# Patient Record
Sex: Female | Born: 1950 | Race: White | Hispanic: No | Marital: Married | Smoking: Never smoker
Health system: Southern US, Community
[De-identification: ages and names within clinical notes are randomized; demographics above are authoritative.]

## PROBLEM LIST (undated history)

## (undated) DIAGNOSIS — Z7901 Long term (current) use of anticoagulants: Secondary | ICD-10-CM

## (undated) DIAGNOSIS — E119 Type 2 diabetes mellitus without complications: Secondary | ICD-10-CM

## (undated) DIAGNOSIS — I739 Peripheral vascular disease, unspecified: Secondary | ICD-10-CM

## (undated) DIAGNOSIS — R42 Dizziness and giddiness: Secondary | ICD-10-CM

## (undated) DIAGNOSIS — E039 Hypothyroidism, unspecified: Secondary | ICD-10-CM

## (undated) DIAGNOSIS — C801 Malignant (primary) neoplasm, unspecified: Secondary | ICD-10-CM

## (undated) DIAGNOSIS — D696 Thrombocytopenia, unspecified: Secondary | ICD-10-CM

## (undated) DIAGNOSIS — D649 Anemia, unspecified: Secondary | ICD-10-CM

## (undated) DIAGNOSIS — Z7982 Long term (current) use of aspirin: Secondary | ICD-10-CM

## (undated) DIAGNOSIS — Z5189 Encounter for other specified aftercare: Secondary | ICD-10-CM

## (undated) DIAGNOSIS — F419 Anxiety disorder, unspecified: Secondary | ICD-10-CM

## (undated) DIAGNOSIS — M545 Low back pain, unspecified: Secondary | ICD-10-CM

## (undated) DIAGNOSIS — Z8719 Personal history of other diseases of the digestive system: Secondary | ICD-10-CM

## (undated) DIAGNOSIS — E785 Hyperlipidemia, unspecified: Secondary | ICD-10-CM

## (undated) DIAGNOSIS — I5189 Other ill-defined heart diseases: Secondary | ICD-10-CM

## (undated) DIAGNOSIS — H269 Unspecified cataract: Secondary | ICD-10-CM

## (undated) DIAGNOSIS — G629 Polyneuropathy, unspecified: Secondary | ICD-10-CM

## (undated) DIAGNOSIS — G4733 Obstructive sleep apnea (adult) (pediatric): Secondary | ICD-10-CM

## (undated) DIAGNOSIS — I700 Atherosclerosis of aorta: Secondary | ICD-10-CM

## (undated) DIAGNOSIS — M1990 Unspecified osteoarthritis, unspecified site: Secondary | ICD-10-CM

## (undated) DIAGNOSIS — M542 Cervicalgia: Secondary | ICD-10-CM

## (undated) DIAGNOSIS — I2510 Atherosclerotic heart disease of native coronary artery without angina pectoris: Secondary | ICD-10-CM

## (undated) DIAGNOSIS — M419 Scoliosis, unspecified: Secondary | ICD-10-CM

## (undated) DIAGNOSIS — Z9842 Cataract extraction status, left eye: Secondary | ICD-10-CM

## (undated) DIAGNOSIS — J45909 Unspecified asthma, uncomplicated: Secondary | ICD-10-CM

## (undated) DIAGNOSIS — E538 Deficiency of other specified B group vitamins: Secondary | ICD-10-CM

## (undated) DIAGNOSIS — I10 Essential (primary) hypertension: Secondary | ICD-10-CM

## (undated) DIAGNOSIS — M5136 Other intervertebral disc degeneration, lumbar region: Secondary | ICD-10-CM

## (undated) DIAGNOSIS — M51369 Other intervertebral disc degeneration, lumbar region without mention of lumbar back pain or lower extremity pain: Secondary | ICD-10-CM

## (undated) DIAGNOSIS — Z9841 Cataract extraction status, right eye: Secondary | ICD-10-CM

## (undated) DIAGNOSIS — G8929 Other chronic pain: Secondary | ICD-10-CM

## (undated) DIAGNOSIS — K219 Gastro-esophageal reflux disease without esophagitis: Secondary | ICD-10-CM

## (undated) DIAGNOSIS — N189 Chronic kidney disease, unspecified: Secondary | ICD-10-CM

## (undated) DIAGNOSIS — N183 Chronic kidney disease, stage 3 unspecified: Secondary | ICD-10-CM

## (undated) DIAGNOSIS — B009 Herpesviral infection, unspecified: Secondary | ICD-10-CM

## (undated) DIAGNOSIS — Z87442 Personal history of urinary calculi: Secondary | ICD-10-CM

## (undated) HISTORY — PX: CYSTECTOMY: SUR359

## (undated) HISTORY — DX: Deficiency of other specified B group vitamins: E53.8

## (undated) HISTORY — DX: Anemia, unspecified: D64.9

## (undated) HISTORY — PX: SPINE SURGERY: SHX786

## (undated) HISTORY — PX: ABDOMINAL HYSTERECTOMY: SHX81

## (undated) HISTORY — DX: Hyperlipidemia, unspecified: E78.5

## (undated) HISTORY — PX: BACK SURGERY: SHX140

## (undated) HISTORY — DX: Chronic kidney disease, unspecified: N18.9

## (undated) HISTORY — DX: Essential (primary) hypertension: I10

## (undated) HISTORY — DX: Type 2 diabetes mellitus without complications: E11.9

## (undated) HISTORY — PX: TUMOR EXCISION: SHX421

## (undated) HISTORY — DX: Encounter for other specified aftercare: Z51.89

## (undated) HISTORY — PX: LUMBAR FUSION: SHX111

## (undated) HISTORY — DX: Herpesviral infection, unspecified: B00.9

## (undated) HISTORY — DX: Malignant (primary) neoplasm, unspecified: C80.1

## (undated) HISTORY — DX: Unspecified cataract: H26.9

## (undated) HISTORY — PX: EYE SURGERY: SHX253

## (undated) HISTORY — DX: Atherosclerotic heart disease of native coronary artery without angina pectoris: I25.10

## (undated) HISTORY — DX: Unspecified osteoarthritis, unspecified site: M19.90

## (undated) HISTORY — PX: CATARACT EXTRACTION: SUR2

## (undated) SURGERY — ESOPHAGOGASTRODUODENOSCOPY (EGD) WITH PROPOFOL
Anesthesia: General

---

## 1984-05-31 DIAGNOSIS — C499 Malignant neoplasm of connective and soft tissue, unspecified: Secondary | ICD-10-CM

## 1984-05-31 HISTORY — PX: TUMOR EXCISION: SHX421

## 1984-05-31 HISTORY — DX: Malignant neoplasm of connective and soft tissue, unspecified: C49.9

## 1990-05-31 HISTORY — PX: VAGINAL HYSTERECTOMY: SHX2639

## 1999-06-01 HISTORY — PX: LUMBAR FUSION: SHX111

## 2007-06-01 DIAGNOSIS — Z955 Presence of coronary angioplasty implant and graft: Secondary | ICD-10-CM

## 2007-06-01 HISTORY — DX: Presence of coronary angioplasty implant and graft: Z95.5

## 2007-06-01 HISTORY — PX: CARDIAC CATHETERIZATION: SHX172

## 2007-07-02 HISTORY — PX: CARDIAC CATHETERIZATION: SHX172

## 2007-07-02 HISTORY — PX: CORONARY ANGIOPLASTY WITH STENT PLACEMENT: SHX49

## 2008-03-31 HISTORY — PX: CORONARY ANGIOPLASTY WITH STENT PLACEMENT: SHX49

## 2008-03-31 HISTORY — PX: CORONARY STENT PLACEMENT: SHX1402

## 2011-09-29 ENCOUNTER — Ambulatory Visit: Payer: Self-pay | Admitting: Internal Medicine

## 2011-10-18 ENCOUNTER — Emergency Department: Payer: Self-pay | Admitting: Emergency Medicine

## 2011-10-18 LAB — URINALYSIS, COMPLETE
Bilirubin,UR: NEGATIVE
Blood: NEGATIVE
Glucose,UR: 50 mg/dL (ref 0–75)
Nitrite: NEGATIVE
Ph: 5 (ref 4.5–8.0)
Protein: 30
RBC,UR: 3 /HPF (ref 0–5)
Squamous Epithelial: 3

## 2011-10-18 LAB — COMPREHENSIVE METABOLIC PANEL
Albumin: 4.1 g/dL (ref 3.4–5.0)
Chloride: 104 mmol/L (ref 98–107)
Creatinine: 1.6 mg/dL — ABNORMAL HIGH (ref 0.60–1.30)
EGFR (African American): 40 — ABNORMAL LOW
EGFR (Non-African Amer.): 34 — ABNORMAL LOW
Glucose: 226 mg/dL — ABNORMAL HIGH (ref 65–99)
Potassium: 4.5 mmol/L (ref 3.5–5.1)
SGPT (ALT): 26 U/L
Sodium: 138 mmol/L (ref 136–145)
Total Protein: 7.9 g/dL (ref 6.4–8.2)

## 2011-10-18 LAB — CBC WITH DIFFERENTIAL/PLATELET
Basophil #: 0 10*3/uL (ref 0.0–0.1)
Basophil %: 0.4 %
Eosinophil #: 0.1 10*3/uL (ref 0.0–0.7)
HCT: 37.2 % (ref 35.0–47.0)
HGB: 12 g/dL (ref 12.0–16.0)
MCHC: 32.4 g/dL (ref 32.0–36.0)
MCV: 94 fL (ref 80–100)
Monocyte %: 5.7 %
Neutrophil #: 6.1 10*3/uL (ref 1.4–6.5)
Platelet: 237 10*3/uL (ref 150–440)
RBC: 3.96 10*6/uL (ref 3.80–5.20)
RDW: 13.3 % (ref 11.5–14.5)

## 2011-10-18 LAB — CK TOTAL AND CKMB (NOT AT ARMC)
CK, Total: 63 U/L (ref 21–215)
CK-MB: 0.7 ng/mL (ref 0.5–3.6)

## 2011-10-18 LAB — TROPONIN I: Troponin-I: <0.02 ng/mL

## 2011-10-19 ENCOUNTER — Other Ambulatory Visit: Payer: Self-pay

## 2011-10-19 ENCOUNTER — Ambulatory Visit: Payer: Self-pay | Admitting: Internal Medicine

## 2011-10-22 ENCOUNTER — Ambulatory Visit: Payer: Self-pay | Admitting: General Practice

## 2012-10-03 ENCOUNTER — Ambulatory Visit: Payer: Self-pay | Admitting: Internal Medicine

## 2013-01-24 ENCOUNTER — Ambulatory Visit: Payer: Self-pay | Admitting: General Practice

## 2013-03-22 DIAGNOSIS — D649 Anemia, unspecified: Secondary | ICD-10-CM | POA: Insufficient documentation

## 2013-03-22 DIAGNOSIS — I739 Peripheral vascular disease, unspecified: Secondary | ICD-10-CM | POA: Insufficient documentation

## 2013-03-27 DIAGNOSIS — M5417 Radiculopathy, lumbosacral region: Secondary | ICD-10-CM

## 2013-03-27 DIAGNOSIS — Z981 Arthrodesis status: Secondary | ICD-10-CM | POA: Insufficient documentation

## 2013-03-27 DIAGNOSIS — M5414 Radiculopathy, thoracic region: Secondary | ICD-10-CM | POA: Insufficient documentation

## 2013-03-27 HISTORY — PX: POSTERIOR LUMBAR FUSION: SHX6036

## 2013-10-03 ENCOUNTER — Ambulatory Visit: Payer: Self-pay | Admitting: General Practice

## 2013-11-14 ENCOUNTER — Ambulatory Visit: Payer: Self-pay | Admitting: Ophthalmology

## 2013-11-14 HISTORY — PX: CATARACT EXTRACTION W/ INTRAOCULAR LENS IMPLANT: SHX1309

## 2013-11-22 ENCOUNTER — Ambulatory Visit: Payer: Self-pay | Admitting: Internal Medicine

## 2014-04-05 ENCOUNTER — Ambulatory Visit: Payer: Self-pay | Admitting: Internal Medicine

## 2014-04-23 DIAGNOSIS — E669 Obesity, unspecified: Secondary | ICD-10-CM | POA: Insufficient documentation

## 2014-05-09 DIAGNOSIS — Z9889 Other specified postprocedural states: Secondary | ICD-10-CM | POA: Insufficient documentation

## 2014-05-09 HISTORY — PX: POSTERIOR LUMBAR FUSION: SHX6036

## 2014-05-14 ENCOUNTER — Emergency Department: Payer: Self-pay | Admitting: Emergency Medicine

## 2014-05-14 LAB — COMPREHENSIVE METABOLIC PANEL
Albumin: 2.6 g/dL — ABNORMAL LOW (ref 3.4–5.0)
Alkaline Phosphatase: 82 U/L
Anion Gap: 9 (ref 7–16)
BUN: 20 mg/dL — AB (ref 7–18)
Bilirubin,Total: 0.8 mg/dL (ref 0.2–1.0)
CALCIUM: 8.4 mg/dL — AB (ref 8.5–10.1)
CHLORIDE: 109 mmol/L — AB (ref 98–107)
CO2: 23 mmol/L (ref 21–32)
Creatinine: 1.51 mg/dL — ABNORMAL HIGH (ref 0.60–1.30)
EGFR (African American): 45 — ABNORMAL LOW
GFR CALC NON AF AMER: 37 — AB
Glucose: 154 mg/dL — ABNORMAL HIGH (ref 65–99)
OSMOLALITY: 287 (ref 275–301)
Potassium: 4.3 mmol/L (ref 3.5–5.1)
SGOT(AST): 23 U/L (ref 15–37)
SGPT (ALT): 19 U/L
Sodium: 141 mmol/L (ref 136–145)
Total Protein: 6.4 g/dL (ref 6.4–8.2)

## 2014-05-14 LAB — CBC WITH DIFFERENTIAL/PLATELET
Basophil #: 0 10*3/uL (ref 0.0–0.1)
Basophil %: 0.4 %
Eosinophil #: 0.1 10*3/uL (ref 0.0–0.7)
Eosinophil %: 1.1 %
HCT: 30.4 % — AB (ref 35.0–47.0)
HGB: 10.3 g/dL — AB (ref 12.0–16.0)
LYMPHS ABS: 0.7 10*3/uL — AB (ref 1.0–3.6)
Lymphocyte %: 10.4 %
MCH: 31 pg (ref 26.0–34.0)
MCHC: 33.8 g/dL (ref 32.0–36.0)
MCV: 92 fL (ref 80–100)
MONO ABS: 0.5 x10 3/mm (ref 0.2–0.9)
Monocyte %: 6.8 %
Neutrophil #: 5.9 10*3/uL (ref 1.4–6.5)
Neutrophil %: 81.3 %
Platelet: 170 10*3/uL (ref 150–440)
RBC: 3.32 10*6/uL — ABNORMAL LOW (ref 3.80–5.20)
RDW: 13.4 % (ref 11.5–14.5)
WBC: 7.2 10*3/uL (ref 3.6–11.0)

## 2014-05-14 LAB — LIPASE, BLOOD: LIPASE: 89 U/L (ref 73–393)

## 2014-06-18 ENCOUNTER — Ambulatory Visit: Payer: Self-pay | Admitting: Surgery

## 2014-11-18 ENCOUNTER — Encounter (INDEPENDENT_AMBULATORY_CARE_PROVIDER_SITE_OTHER): Payer: Self-pay

## 2014-11-18 ENCOUNTER — Ambulatory Visit: Payer: BLUE CROSS/BLUE SHIELD | Attending: Pain Medicine | Admitting: Pain Medicine

## 2014-11-18 ENCOUNTER — Encounter: Payer: Self-pay | Admitting: Pain Medicine

## 2014-11-18 VITALS — BP 134/52 | HR 89 | Temp 97.6°F | Resp 14 | Ht 62.5 in | Wt 184.0 lb

## 2014-11-18 DIAGNOSIS — Z9889 Other specified postprocedural states: Secondary | ICD-10-CM | POA: Diagnosis not present

## 2014-11-18 DIAGNOSIS — G5702 Lesion of sciatic nerve, left lower limb: Secondary | ICD-10-CM | POA: Insufficient documentation

## 2014-11-18 DIAGNOSIS — M533 Sacrococcygeal disorders, not elsewhere classified: Secondary | ICD-10-CM

## 2014-11-18 DIAGNOSIS — M47816 Spondylosis without myelopathy or radiculopathy, lumbar region: Secondary | ICD-10-CM | POA: Insufficient documentation

## 2014-11-18 DIAGNOSIS — G5710 Meralgia paresthetica, unspecified lower limb: Secondary | ICD-10-CM | POA: Insufficient documentation

## 2014-11-18 DIAGNOSIS — M4316 Spondylolisthesis, lumbar region: Secondary | ICD-10-CM | POA: Insufficient documentation

## 2014-11-18 DIAGNOSIS — G5700 Lesion of sciatic nerve, unspecified lower limb: Secondary | ICD-10-CM

## 2014-11-18 DIAGNOSIS — Z981 Arthrodesis status: Secondary | ICD-10-CM | POA: Insufficient documentation

## 2014-11-18 DIAGNOSIS — M961 Postlaminectomy syndrome, not elsewhere classified: Secondary | ICD-10-CM | POA: Insufficient documentation

## 2014-11-18 DIAGNOSIS — M545 Low back pain: Secondary | ICD-10-CM | POA: Diagnosis present

## 2014-11-18 DIAGNOSIS — M5136 Other intervertebral disc degeneration, lumbar region: Secondary | ICD-10-CM | POA: Insufficient documentation

## 2014-11-18 DIAGNOSIS — M2578 Osteophyte, vertebrae: Secondary | ICD-10-CM | POA: Insufficient documentation

## 2014-11-18 NOTE — Patient Instructions (Addendum)
Continue present medications.  Cluneal and sciatic nerve block a 11/27/2014  F/U PCP for evaliation of  BP and general medical  condition.  F/U surgical evaluation.  F/U neurological evaluation.  May consider radiofrequency rhizolysis or intraspinal procedures pending response to present treatment and F/U evaluation.  Patient to call Pain Management Center should patient have concerns prior to scheduled return appointment.    GENERAL RISKS AND COMPLICATIONS  What are the risk, side effects and possible complications? Generally speaking, most procedures are safe.  However, with any procedure there are risks, side effects, and the possibility of complications.  The risks and complications are dependent upon the sites that are lesioned, or the type of nerve block to be performed.  The closer the procedure is to the spine, the more serious the risks are.  Great care is taken when placing the radio frequency needles, block needles or lesioning probes, but sometimes complications can occur. 1. Infection: Any time there is an injection through the skin, there is a risk of infection.  This is why sterile conditions are used for these blocks.  There are four possible types of infection. 1. Localized skin infection. 2. Central Nervous System Infection-This can be in the form of Meningitis, which can be deadly. 3. Epidural Infections-This can be in the form of an epidural abscess, which can cause pressure inside of the spine, causing compression of the spinal cord with subsequent paralysis. This would require an emergency surgery to decompress, and there are no guarantees that the patient would recover from the paralysis. 4. Discitis-This is an infection of the intervertebral discs.  It occurs in about 1% of discography procedures.  It is difficult to treat and it may lead to surgery.        2. Pain: the needles have to go through skin and soft tissues, will cause soreness.       3. Damage to internal  structures:  The nerves to be lesioned may be near blood vessels or    other nerves which can be potentially damaged.       4. Bleeding: Bleeding is more common if the patient is taking blood thinners such as  aspirin, Coumadin, Ticiid, Plavix, etc., or if he/she have some genetic predisposition  such as hemophilia. Bleeding into the spinal canal can cause compression of the spinal  cord with subsequent paralysis.  This would require an emergency surgery to  decompress and there are no guarantees that the patient would recover from the  paralysis.       5. Pneumothorax:  Puncturing of a lung is a possibility, every time a needle is introduced in  the area of the chest or upper back.  Pneumothorax refers to free air around the  collapsed lung(s), inside of the thoracic cavity (chest cavity).  Another two possible  complications related to a similar event would include: Hemothorax and Chylothorax.   These are variations of the Pneumothorax, where instead of air around the collapsed  lung(s), you may have blood or chyle, respectively.       6. Spinal headaches: They may occur with any procedures in the area of the spine.       7. Persistent CSF (Cerebro-Spinal Fluid) leakage: This is a rare problem, but may occur  with prolonged intrathecal or epidural catheters either due to the formation of a fistulous  track or a dural tear.       8. Nerve damage: By working so close to the spinal cord, there  is always a possibility of  nerve damage, which could be as serious as a permanent spinal cord injury with  paralysis.       9. Death:  Although rare, severe deadly allergic reactions known as "Anaphylactic  reaction" can occur to any of the medications used.      10. Worsening of the symptoms:  We can always make thing worse.  What are the chances of something like this happening? Chances of any of this occuring are extremely low.  By statistics, you have more of a chance of getting killed in a motor vehicle  accident: while driving to the hospital than any of the above occurring .  Nevertheless, you should be aware that they are possibilities.  In general, it is similar to taking a shower.  Everybody knows that you can slip, hit your head and get killed.  Does that mean that you should not shower again?  Nevertheless always keep in mind that statistics do not mean anything if you happen to be on the wrong side of them.  Even if a procedure has a 1 (one) in a 1,000,000 (million) chance of going wrong, it you happen to be that one..Also, keep in mind that by statistics, you have more of a chance of having something go wrong when taking medications.  Who should not have this procedure? If you are on a blood thinning medication (e.g. Coumadin, Plavix, see list of "Blood Thinners"), or if you have an active infection going on, you should not have the procedure.  If you are taking any blood thinners, please inform your physician.  How should I prepare for this procedure?  Do not eat or drink anything at least six hours prior to the procedure.  Bring a driver with you .  It cannot be a taxi.  Come accompanied by an adult that can drive you back, and that is strong enough to help you if your legs get weak or numb from the local anesthetic.  Take all of your medicines the morning of the procedure with just enough water to swallow them.  If you have diabetes, make sure that you are scheduled to have your procedure done first thing in the morning, whenever possible.  If you have diabetes, take only half of your insulin dose and notify our nurse that you have done so as soon as you arrive at the clinic.  If you are diabetic, but only take blood sugar pills (oral hypoglycemic), then do not take them on the morning of your procedure.  You may take them after you have had the procedure.  Do not take aspirin or any aspirin-containing medications, at least eleven (11) days prior to the procedure.  They may prolong  bleeding.  Wear loose fitting clothing that may be easy to take off and that you would not mind if it got stained with Betadine or blood.  Do not wear any jewelry or perfume  Remove any nail coloring.  It will interfere with some of our monitoring equipment.  NOTE: Remember that this is not meant to be interpreted as a complete list of all possible complications.  Unforeseen problems may occur.  BLOOD THINNERS The following drugs contain aspirin or other products, which can cause increased bleeding during surgery and should not be taken for 2 weeks prior to and 1 week after surgery.  If you should need take something for relief of minor pain, you may take acetaminophen which is found in Tylenol,m Datril, Anacin-3  and Panadol. It is not blood thinner. The products listed below are.  Do not take any of the products listed below in addition to any listed on your instruction sheet.  A.P.C or A.P.C with Codeine Codeine Phosphate Capsules #3 Ibuprofen Ridaura  ABC compound Congesprin Imuran rimadil  Advil Cope Indocin Robaxisal  Alka-Seltzer Effervescent Pain Reliever and Antacid Coricidin or Coricidin-D  Indomethacin Rufen  Alka-Seltzer plus Cold Medicine Cosprin Ketoprofen S-A-C Tablets  Anacin Analgesic Tablets or Capsules Coumadin Korlgesic Salflex  Anacin Extra Strength Analgesic tablets or capsules CP-2 Tablets Lanoril Salicylate  Anaprox Cuprimine Capsules Levenox Salocol  Anexsia-D Dalteparin Magan Salsalate  Anodynos Darvon compound Magnesium Salicylate Sine-off  Ansaid Dasin Capsules Magsal Sodium Salicylate  Anturane Depen Capsules Marnal Soma  APF Arthritis pain formula Dewitt's Pills Measurin Stanback  Argesic Dia-Gesic Meclofenamic Sulfinpyrazone  Arthritis Bayer Timed Release Aspirin Diclofenac Meclomen Sulindac  Arthritis pain formula Anacin Dicumarol Medipren Supac  Analgesic (Safety coated) Arthralgen Diffunasal Mefanamic Suprofen  Arthritis Strength Bufferin Dihydrocodeine  Mepro Compound Suprol  Arthropan liquid Dopirydamole Methcarbomol with Aspirin Synalgos  ASA tablets/Enseals Disalcid Micrainin Tagament  Ascriptin Doan's Midol Talwin  Ascriptin A/D Dolene Mobidin Tanderil  Ascriptin Extra Strength Dolobid Moblgesic Ticlid  Ascriptin with Codeine Doloprin or Doloprin with Codeine Momentum Tolectin  Asperbuf Duoprin Mono-gesic Trendar  Aspergum Duradyne Motrin or Motrin IB Triminicin  Aspirin plain, buffered or enteric coated Durasal Myochrisine Trigesic  Aspirin Suppositories Easprin Nalfon Trillsate  Aspirin with Codeine Ecotrin Regular or Extra Strength Naprosyn Uracel  Atromid-S Efficin Naproxen Ursinus  Auranofin Capsules Elmiron Neocylate Vanquish  Axotal Emagrin Norgesic Verin  Azathioprine Empirin or Empirin with Codeine Normiflo Vitamin E  Azolid Emprazil Nuprin Voltaren  Bayer Aspirin plain, buffered or children's or timed BC Tablets or powders Encaprin Orgaran Warfarin Sodium  Buff-a-Comp Enoxaparin Orudis Zorpin  Buff-a-Comp with Codeine Equegesic Os-Cal-Gesic   Buffaprin Excedrin plain, buffered or Extra Strength Oxalid   Bufferin Arthritis Strength Feldene Oxphenbutazone   Bufferin plain or Extra Strength Feldene Capsules Oxycodone with Aspirin   Bufferin with Codeine Fenoprofen Fenoprofen Pabalate or Pabalate-SF   Buffets II Flogesic Panagesic   Buffinol plain or Extra Strength Florinal or Florinal with Codeine Panwarfarin   Buf-Tabs Flurbiprofen Penicillamine   Butalbital Compound Four-way cold tablets Penicillin   Butazolidin Fragmin Pepto-Bismol   Carbenicillin Geminisyn Percodan   Carna Arthritis Reliever Geopen Persantine   Carprofen Gold's salt Persistin   Chloramphenicol Goody's Phenylbutazone   Chloromycetin Haltrain Piroxlcam   Clmetidine heparin Plaquenil   Cllnoril Hyco-pap Ponstel   Clofibrate Hydroxy chloroquine Propoxyphen         Before stopping any of these medications, be sure to consult the physician who ordered  them.  Some, such as Coumadin (Warfarin) are ordered to prevent or treat serious conditions such as "deep thrombosis", "pumonary embolisms", and other heart problems.  The amount of time that you may need off of the medication may also vary with the medication and the reason for which you were taking it.  If you are taking any of these medications, please make sure you notify your pain physician before you undergo any procedures.

## 2014-11-18 NOTE — Progress Notes (Signed)
Safety precautions to be maintained throughout the outpatient stay will include: orient to surroundings, keep bed in low position, maintain call bell within reach at all times, provide assistance with transfer out of bed and ambulation. Discharge ambulatory at 2:50 pm

## 2014-11-18 NOTE — Progress Notes (Signed)
Subjective:    Patient ID: Dorothy Rogers, female    DOB: Mar 14, 1951, 64 y.o.   MRN: [id number]  HPI   the patient is a 64 year old female who comes to pain management Center at the request of [facility] Reek for further evaluation and treatment of pain involving the lower back lower extremity region. The patient is status post surgical intervention of the lumbar region 3. Patient is undergone evaluation by Dr. Napoleon Form was recommended patient comes to pain management Center for further treatment of her condition. We discussed patient's condition on today's visit. Patient described her pain as aching burning deep sensation with feeling of constriction and pressure-like sensation involving the buttocks on the left predominantly. The pain occasionally awakens patient from sleep and is aggravated by walking working any motion sitting standing and lifting climbing bending. Patient states that the pain has decreased with hot packs resting sitting sleeping use of brace physical therapy. We discussed patient's overall condition and will consider patient for interventional treatment at time return appointment. We will proceed with [place] and sciatic nerve block at time of return appointment. We will also discussed patient being candidate for ilioinguinal , iliohypogastric, genitofemoral nerve blocks as  well as interventional treatment of the spine. We will observe response to cluneal and sciatic nerve block, consider patient for additional modification of treatment pending response to cluneal sciatic nerve block. The patient was understanding and agreed with suggested treatment plan.      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 Palpation of the cervical facet thoracic facet regions were with tenderness to palpation of mild degree. Palpation of the acromioclavicular and glenohumeral joint regions were with mild discomfort. Tinel and Phalen's maneuver were without increase of pain of significant degree. There was no crepitus of the thoracic region noted. Palpation of the lower thoracic paraspinal musculature region was with evidence of mild to moderate muscle spasm. Palpation over the lumbar paraspinal musculature region lumbar facet region was a tends to palpation of moderate degree left greater than the right. Lateral bending and rotation and extension and palpation of the lumbar facets reproduce moderate to moderately severe discomfort period there was significant increase of pain with palpation of the PSIS PSIS region. Palpation of the gluteal and piriformis musculature regions reproduced moderate to moderately severe pain. Straight leg  Raising was tolerates approximately 20 without increase of pain with dorsiflexion noted. There was negative clonus negative Homans. Palpation of the greater trochanteric region iliotibial band region was a tends to palpation of mild degree. Negative clonus negative Homans. Abdomen nontender and no costovertebral angle tenderness noted.       Assessment & Plan:   Degenerative disc disease lumbar spine  Post surgical intervention lumbar region 3  Status post posterior fusion L3 L4 L5 and S1  Grade 1 retrolisthesis of L1 on L2, L2-L3 , and L3 on L4 without significant change from prior study  Rod and screw devices transfixing L3, L4,  and L5 vertebral with by pedicular approach disc cage seen at the L5-S1 level.  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  Cluneal and sciatic nerve blocks to be performed at time return appointment  F/U PCP for evaliation of  BP and general medical  condition.  F/U surgical evaluation.  F/U neurological evaluation.  Will consider TENS unit as discussed  pending further assessment of patient's condition  May consider radiofrequency rhizolysis or intraspinal procedures pending response to present treatment and F/U evaluation.  Patient to call Pain Management Center should patient have concerns prior to scheduled return appointment.

## 2014-11-20 ENCOUNTER — Telehealth: Payer: Self-pay | Admitting: Pain Medicine

## 2014-11-20 NOTE — Telephone Encounter (Signed)
Has some questions about upcoming procedure / please call to discuss

## 2014-11-20 NOTE — Telephone Encounter (Signed)
Pateints questions and concerns answered. Pre procedure instructions given.

## 2014-11-25 ENCOUNTER — Telehealth: Payer: Self-pay | Admitting: *Deleted

## 2014-11-25 NOTE — Telephone Encounter (Signed)
Called patient  Left msg that procedure  Was approved

## 2014-11-26 ENCOUNTER — Other Ambulatory Visit: Payer: Self-pay | Admitting: Pain Medicine

## 2014-11-27 ENCOUNTER — Ambulatory Visit: Payer: BLUE CROSS/BLUE SHIELD | Attending: Pain Medicine | Admitting: Pain Medicine

## 2014-11-27 ENCOUNTER — Encounter: Payer: Self-pay | Admitting: Pain Medicine

## 2014-11-27 VITALS — BP 106/86 | HR 85 | Temp 96.8°F | Resp 18 | Ht 62.5 in | Wt 184.0 lb

## 2014-11-27 DIAGNOSIS — M79604 Pain in right leg: Secondary | ICD-10-CM | POA: Diagnosis present

## 2014-11-27 DIAGNOSIS — M79605 Pain in left leg: Secondary | ICD-10-CM | POA: Diagnosis present

## 2014-11-27 DIAGNOSIS — G5702 Lesion of sciatic nerve, left lower limb: Secondary | ICD-10-CM

## 2014-11-27 DIAGNOSIS — M2578 Osteophyte, vertebrae: Secondary | ICD-10-CM | POA: Diagnosis not present

## 2014-11-27 DIAGNOSIS — M5136 Other intervertebral disc degeneration, lumbar region: Secondary | ICD-10-CM | POA: Diagnosis not present

## 2014-11-27 DIAGNOSIS — M47816 Spondylosis without myelopathy or radiculopathy, lumbar region: Secondary | ICD-10-CM | POA: Diagnosis not present

## 2014-11-27 DIAGNOSIS — Z981 Arthrodesis status: Secondary | ICD-10-CM | POA: Insufficient documentation

## 2014-11-27 DIAGNOSIS — M4316 Spondylolisthesis, lumbar region: Secondary | ICD-10-CM | POA: Diagnosis not present

## 2014-11-27 DIAGNOSIS — M545 Low back pain: Secondary | ICD-10-CM | POA: Diagnosis present

## 2014-11-27 DIAGNOSIS — M961 Postlaminectomy syndrome, not elsewhere classified: Secondary | ICD-10-CM

## 2014-11-27 MED ORDER — TRIAMCINOLONE ACETONIDE 40 MG/ML IJ SUSP
INTRAMUSCULAR | Status: AC
  Filled 2014-11-27: qty 1

## 2014-11-27 MED ORDER — BUPIVACAINE HCL (PF) 0.25 % IJ SOLN
INTRAMUSCULAR | Status: AC
  Filled 2014-11-27: qty 30

## 2014-11-27 MED ORDER — ORPHENADRINE CITRATE 30 MG/ML IJ SOLN
INTRAMUSCULAR | Status: AC
  Filled 2014-11-27: qty 2

## 2014-11-27 NOTE — Progress Notes (Signed)
Safety precautions to be maintained throughout the outpatient stay will include: orient to surroundings, keep bed in low position, maintain call bell within reach at all times, provide assistance with transfer out of bed and ambulation.  

## 2014-11-27 NOTE — Progress Notes (Signed)
Subjective:    Patient ID: Dorothy Rogers, female    DOB: 01/30/1951, 64 y.o.   MRN: [id number]  HPI  PROCEDURE PERFORMED: Cluneal sciatic nerve block   NOTE: The patient is a 64 y.o. female who returns to [place] [facility] for further evaluation and treatment of pain involving the lumbar and lower extremity region with pain occurring in the region of the buttocks and lower extremity of significant degree. Prior studies revealed the patient to be with MRI findings of degenerative disc disease lumbar spine with post surgical intervention of the lumbar region 3 with prior posterior fusion L3-L4 L4-5 and S1. Grade 1 retrolisthesis of L1 on L2, L2-L3, and L3 and L4 without significant change from prior study. Rod and screw devices transfixing L3, L4, and L5 vertebral body levels with radicular approach disc cage seen at L5-S1 level. Prominent marginal osteophytes with moderate spondylosis throughout the lumbar spine. There is concern regarding intraspinal abnormalities contributing to patient's symptomatology as well as concern regarding significant component of patient's pain being due to and piriformis neuralgia. The risks, benefits, and expectations of the procedure have been discussed and explained to the patient who was understanding and in agreement with suggested treatment plan. We will proceed with interventional treatment as discussed and as explained to the patient who wishes to proceed with proposed treatment.   PROCEDURE #1: Left cluneal nerve block with EKG, blood pressure, pulse, and pulse oximetry monitoring. The procedure was performed with the patient in the lateral decubitus position. Following alcohol prep of proposed entry site and identification of landmarks, a 22 -gauge needle was inserted into the gluteal musculature region and following elicitation of paresthesias radiating to the buttocks, needle was slightly withdrawn and following negative aspiration, a total of 4 mL of  0.25% bupivacaine with Kenalog injected for left  cluneal nerve block. Needle removed. The patient tolerated the injection well.   PROCEDURE #2: Left sciatic nerve block with EKG, blood pressure, pulse, and pulse oximetry monitoring. The procedure was performed with the patient in the lateral decubitus position. Following identification of greater trochanter for establishing point of needle entry and alcohol prep of proposed entry site, a 22 -gauge needle was inserted and following elicitation of paresthesias radiating from buttocks to the foot, needle was slightly withdrawn. Following negative aspiration, a total of 4 mL of 0.25% bupivacaine with Kenalog injected for left sciatic nerve block. Needle removed. The patient tolerated injection well. A total of 10 mg of Kenalog was utilized for the procedure.   PLAN:   1. Medications: Will continue presently prescribed medications. 2. Will consider modification of treatment regimen pending response to treatment rendered on today's visit and follow-up evaluation. 3. The patient is to follow-up with primary care physician, Dr. Doy Hutching, regarding blood pressure, diabetes mellitus, and general medical condition status pose procedure performed on today's visit. 4. Surgical evaluation as discussed. 5. Neurological evaluation as discussed. 6. The patient may be a candidate for radiofrequency procedures, Botox injections, implantation type procedures, and other treatment pending response to treatment rendered on today's visit and follow-up evaluation. 7. The patient has been advised to adhere to proper body mechanics and avoid activities which appear to aggravate condition. 8. The patient has been advised to call the Pain Management Center prior to scheduled return appointment should there be significant change in condition or should patient have other concerns regarding condition prior to scheduled return appointment.  The patient is understanding and in  agreement with suggested treatment plan.  Review of Systems     Objective:   Physical Exam        Assessment & Plan:

## 2014-11-27 NOTE — Patient Instructions (Addendum)
Continue present medications  F/U PCP for evaliation of  BP and general medical  condition.  F/U surgical evaluation  F/U neurological evaluation  May consider radiofrequency rhizolysis or intraspinal procedures pending response to present treatment and F/U evaluation.  Patient to call Pain Management Center should patient have concerns prior to scheduled return appointment.   Pain Management Discharge Instructions  General Discharge Instructions :  If you need to reach your doctor call: Monday-Friday 8:00 am - 4:00 pm at [phone] or toll free [phone].  After clinic hours [phone] to have operator reach doctor.  Bring all of your medication bottles to all your appointments in the pain clinic.  To cancel or reschedule your appointment with Pain Management please remember to call 24 hours in advance to avoid a fee.  Refer to the educational materials which you have been given on: General Risks, I had my Procedure. Discharge Instructions, Post Sedation.  Post Procedure Instructions:   You may eat anything you prefer, but it is better to start with liquids then soups and crackers, and gradually work up to solid foods.  Please notify your doctor immediately if you have any unusual bleeding, trouble breathing or pain that is not related to your normal pain.  Depending on the type of procedure that was done, some parts of your body may feel week and/or numb.  This usually clears up by tonight or the next day.  Walk with the use of an assistive device or accompanied by an adult for the 24 hours.  You may use ice on the affected area for the first 24 hours.  Put ice in a Ziploc bag and cover with a towel and place against area 15 minutes on 15 minutes off.  You may switch to heat after 24 hours.

## 2014-11-28 ENCOUNTER — Telehealth: Payer: Self-pay | Admitting: *Deleted

## 2014-11-28 NOTE — Telephone Encounter (Signed)
Patient denies any complications post procedure 

## 2014-12-06 ENCOUNTER — Other Ambulatory Visit: Payer: Self-pay | Admitting: Pain Medicine

## 2014-12-19 ENCOUNTER — Other Ambulatory Visit: Payer: Self-pay | Admitting: Internal Medicine

## 2014-12-19 DIAGNOSIS — Z1231 Encounter for screening mammogram for malignant neoplasm of breast: Secondary | ICD-10-CM

## 2014-12-24 ENCOUNTER — Ambulatory Visit
Admission: RE | Admit: 2014-12-24 | Discharge: 2014-12-24 | Disposition: A | Discharge status: Home / Self Care | Payer: BLUE CROSS/BLUE SHIELD | Source: Ambulatory Visit | Attending: Internal Medicine | Admitting: Internal Medicine

## 2014-12-24 DIAGNOSIS — Z1231 Encounter for screening mammogram for malignant neoplasm of breast: Secondary | ICD-10-CM

## 2014-12-25 ENCOUNTER — Encounter: Payer: Self-pay | Admitting: Pain Medicine

## 2014-12-25 ENCOUNTER — Other Ambulatory Visit: Payer: Self-pay | Admitting: Pain Medicine

## 2014-12-25 ENCOUNTER — Ambulatory Visit: Payer: BLUE CROSS/BLUE SHIELD | Attending: Pain Medicine | Admitting: Pain Medicine

## 2014-12-25 VITALS — BP 138/75 | HR 91 | Temp 98.0°F | Resp 14 | Ht 62.5 in | Wt 186.0 lb

## 2014-12-25 DIAGNOSIS — M47816 Spondylosis without myelopathy or radiculopathy, lumbar region: Secondary | ICD-10-CM | POA: Insufficient documentation

## 2014-12-25 DIAGNOSIS — M545 Low back pain: Secondary | ICD-10-CM | POA: Diagnosis present

## 2014-12-25 DIAGNOSIS — Z9889 Other specified postprocedural states: Secondary | ICD-10-CM | POA: Diagnosis not present

## 2014-12-25 DIAGNOSIS — I10 Essential (primary) hypertension: Secondary | ICD-10-CM | POA: Insufficient documentation

## 2014-12-25 DIAGNOSIS — M961 Postlaminectomy syndrome, not elsewhere classified: Secondary | ICD-10-CM

## 2014-12-25 DIAGNOSIS — G5702 Lesion of sciatic nerve, left lower limb: Secondary | ICD-10-CM

## 2014-12-25 DIAGNOSIS — M5136 Other intervertebral disc degeneration, lumbar region: Secondary | ICD-10-CM

## 2014-12-25 DIAGNOSIS — G588 Other specified mononeuropathies: Secondary | ICD-10-CM | POA: Insufficient documentation

## 2014-12-25 DIAGNOSIS — M79604 Pain in right leg: Secondary | ICD-10-CM | POA: Diagnosis present

## 2014-12-25 DIAGNOSIS — M4316 Spondylolisthesis, lumbar region: Secondary | ICD-10-CM | POA: Insufficient documentation

## 2014-12-25 DIAGNOSIS — G5710 Meralgia paresthetica, unspecified lower limb: Secondary | ICD-10-CM | POA: Diagnosis not present

## 2014-12-25 DIAGNOSIS — E119 Type 2 diabetes mellitus without complications: Secondary | ICD-10-CM | POA: Diagnosis not present

## 2014-12-25 DIAGNOSIS — Z981 Arthrodesis status: Secondary | ICD-10-CM

## 2014-12-25 DIAGNOSIS — M533 Sacrococcygeal disorders, not elsewhere classified: Secondary | ICD-10-CM | POA: Insufficient documentation

## 2014-12-25 DIAGNOSIS — M79605 Pain in left leg: Secondary | ICD-10-CM | POA: Diagnosis present

## 2014-12-25 NOTE — Progress Notes (Signed)
Safety precautions to be maintained throughout the outpatient stay will include: orient to surroundings, keep bed in low position, maintain call bell within reach at all times, provide assistance with transfer out of bed and ambulation.  

## 2014-12-25 NOTE — Patient Instructions (Addendum)
Continue present medications  F/U PCP Dr. Doy Hutching  for evaliation of  BP and general medical  condition.  F/U surgical evaluation  F/U neurological evaluation  May consider radiofrequency rhizolysis or intraspinal procedures pending response to present treatment and F/U evaluation.  Patient to call [facility] should patient have concerns prior to scheduled return appointment. Facet Blocks Patient Information  Description: The facets are joints in the spine between the vertebrae.  Like any joints in the body, facets can become irritated and painful.  Arthritis can also effect the facets.  By injecting steroids and local anesthetic in and around these joints, we can temporarily block the nerve supply to them.  Steroids act directly on irritated nerves and tissues to reduce selling and inflammation which often leads to decreased pain.  Facet blocks may be done anywhere along the spine from the neck to the low back depending upon the location of your pain.   After numbing the skin with local anesthetic (like Novocaine), a small needle is passed onto the facet joints under x-ray guidance.  You may experience a sensation of pressure while this is being done.  The entire block usually lasts about 15-25 minutes.   Conditions which may be treated by facet blocks:   Low back/buttock pain  Neck/shoulder pain  Certain types of headaches  Preparation for the injection:  1. Do not eat any solid food or dairy products within 6 hours of your appointment. 2. You may drink clear liquid up to 2 hours before appointment.  Clear liquids include water, black coffee, juice or soda.  No milk or cream please. 3. You may take your regular medication, including pain medications, with a sip of water before your appointment.  Diabetics should hold regular insulin (if taken separately) and take 1/2 normal NPH dose the morning of the procedure.  Carry some sugar containing items with you to your  appointment. 4. A driver must accompany you and be prepared to drive you home after your procedure. 5. Bring all your current medications with you. 6. An IV may be inserted and sedation may be given at the discretion of the physician. 7. A blood pressure cuff, EKG and other monitors will often be applied during the procedure.  Some patients may need to have extra oxygen administered for a short period. 8. You will be asked to provide medical information, including your allergies and medications, prior to the procedure.  We must know immediately if you are taking blood thinners (like Coumadin/Warfarin) or if you are allergic to IV iodine contrast (dye).  We must know if you could possible be pregnant.  Possible side-effects:   Bleeding from needle site  Infection (rare, may require surgery)  Nerve injury (rare)  Numbness & tingling (temporary)  Difficulty urinating (rare, temporary)  Spinal headache (a headache worse with upright posture)  Light-headedness (temporary)  Pain at injection site (serveral days)  Decreased blood pressure (rare, temporary)  Weakness in arm/leg (temporary)  Pressure sensation in back/neck (temporary)   Call if you experience:   Fever/chills associated with headache or increased back/neck pain  Headache worsened by an upright position  New onset, weakness or numbness of an extremity below the injection site  Hives or difficulty breathing (go to the emergency room)  Inflammation or drainage at the injection site(s)  Severe back/neck pain greater than usual  New symptoms which are concerning to you  Please note:  Although the local anesthetic injected can often make your back or neck  feel good for several hours after the injection, the pain will likely return. It takes 3-7 days for steroids to work.  You may not notice any pain relief for at least one week.  If effective, we will often do a series of 2-3 injections spaced 3-6 weeks apart to  maximally decrease your pain.  After the initial series, you may be a candidate for a more permanent nerve block of the facets.  If you have any questions, please call #[phone]) [place]  What are the risk, side effects and possible complications? Generally speaking, most procedures are safe.  However, with any procedure there are risks, side effects, and the possibility of complications.  The risks and complications are dependent upon the sites that are lesioned, or the type of nerve block to be performed.  The closer the procedure is to the spine, the more serious the risks are.  Great care is taken when placing the radio frequency needles, block needles or lesioning probes, but sometimes complications can occur. 1. Infection: Any time there is an injection through the skin, there is a risk of infection.  This is why sterile conditions are used for these blocks.  There are four possible types of infection. 1. Localized skin infection. 2. Central Nervous System Infection-This can be in the form of Meningitis, which can be deadly. 3. Epidural Infections-This can be in the form of an epidural abscess, which can cause pressure inside of the spine, causing compression of the spinal cord with subsequent paralysis. This would require an emergency surgery to decompress, and there are no guarantees that the patient would recover from the paralysis. 4. Discitis-This is an infection of the intervertebral discs.  It occurs in about 1% of discography procedures.  It is difficult to treat and it may lead to surgery.        2. Pain: the needles have to go through skin and soft tissues, will cause soreness.       3. Damage to internal structures:  The nerves to be lesioned may be near blood vessels or    other nerves which can be potentially damaged.       4. Bleeding: Bleeding is more common if the patient is taking blood thinners such as   aspirin, Coumadin, Ticiid, Plavix, etc., or if he/she have some genetic predisposition  such as hemophilia. Bleeding into the spinal canal can cause compression of the spinal  cord with subsequent paralysis.  This would require an emergency surgery to  decompress and there are no guarantees that the patient would recover from the  paralysis.       5. Pneumothorax:  Puncturing of a lung is a possibility, every time a needle is introduced in  the area of the chest or upper back.  Pneumothorax refers to free air around the  collapsed lung(s), inside of the thoracic cavity (chest cavity).  Another two possible  complications related to a similar event would include: Hemothorax and Chylothorax.   These are variations of the Pneumothorax, where instead of air around the collapsed  lung(s), you may have blood or chyle, respectively.       6. Spinal headaches: They may occur with any procedures in the area of the spine.       7. Persistent CSF (Cerebro-Spinal Fluid) leakage: This is a rare problem, but may occur  with prolonged intrathecal or epidural catheters either due to the formation of a fistulous  track or a dural tear.       8. Nerve damage: By working so close to the spinal cord, there is always a possibility of  nerve damage, which could be as serious as a permanent spinal cord injury with  paralysis.       9. Death:  Although rare, severe deadly allergic reactions known as "Anaphylactic  reaction" can occur to any of the medications used.      10. Worsening of the symptoms:  We can always make thing worse.  What are the chances of something like this happening? Chances of any of this occuring are extremely low.  By statistics, you have more of a chance of getting killed in a motor vehicle accident: while driving to the hospital than any of the above occurring .  Nevertheless, you should be aware that they are possibilities.  In general, it is similar to taking a shower.  Everybody knows that you can  slip, hit your head and get killed.  Does that mean that you should not shower again?  Nevertheless always keep in mind that statistics do not mean anything if you happen to be on the wrong side of them.  Even if a procedure has a 1 (one) in a 1,000,000 (million) chance of going wrong, it you happen to be that one..Also, keep in mind that by statistics, you have more of a chance of having something go wrong when taking medications.  Who should not have this procedure? If you are on a blood thinning medication (e.g. Coumadin, Plavix, see list of "Blood Thinners"), or if you have an active infection going on, you should not have the procedure.  If you are taking any blood thinners, please inform your physician.  How should I prepare for this procedure?  Do not eat or drink anything at least six hours prior to the procedure.  Bring a driver with you .  It cannot be a taxi.  Come accompanied by an adult that can drive you back, and that is strong enough to help you if your legs get weak or numb from the local anesthetic.  Take all of your medicines the morning of the procedure with just enough water to swallow them.  If you have diabetes, make sure that you are scheduled to have your procedure done first thing in the morning, whenever possible.  If you have diabetes, take only half of your insulin dose and notify our nurse that you have done so as soon as you arrive at the clinic.  If you are diabetic, but only take blood sugar pills (oral hypoglycemic), then do not take them on the morning of your procedure.  You may take them after you have had the procedure.  Do not take aspirin or any aspirin-containing medications, at least eleven (11) days prior to the procedure.  They may prolong bleeding.  Wear loose fitting clothing that may be easy to take off and that you would not mind if it got stained with Betadine or blood.  Do not wear any jewelry or perfume  Remove any nail coloring.  It will  interfere with some of our monitoring equipment.  NOTE: Remember that this is not meant to be interpreted as a complete list of all possible complications.  Unforeseen problems may occur.  BLOOD THINNERS The following drugs contain aspirin or other products, which can cause increased bleeding during surgery and should not be taken for 2 weeks prior to and 1 week after surgery.  If you should need take something for relief of minor pain, you may take acetaminophen which is found in Tylenol,m Datril, Anacin-3 and Panadol. It is not blood thinner. The products listed below are.  Do not take any of the products listed below in addition to any listed on your instruction sheet.  A.P.C or A.P.C with Codeine Codeine Phosphate Capsules #3 Ibuprofen Ridaura  ABC compound Congesprin Imuran rimadil  Advil Cope Indocin Robaxisal  Alka-Seltzer Effervescent Pain Reliever and Antacid Coricidin or Coricidin-D  Indomethacin Rufen  Alka-Seltzer plus Cold Medicine Cosprin Ketoprofen S-A-C Tablets  Anacin Analgesic Tablets or Capsules Coumadin Korlgesic Salflex  Anacin Extra Strength Analgesic tablets or capsules CP-2 Tablets Lanoril Salicylate  Anaprox Cuprimine Capsules Levenox Salocol  Anexsia-D Dalteparin Magan Salsalate  Anodynos Darvon compound Magnesium Salicylate Sine-off  Ansaid Dasin Capsules Magsal Sodium Salicylate  Anturane Depen Capsules Marnal Soma  APF Arthritis pain formula Dewitt's Pills Measurin Stanback  Argesic Dia-Gesic Meclofenamic Sulfinpyrazone  Arthritis Bayer Timed Release Aspirin Diclofenac Meclomen Sulindac  Arthritis pain formula Anacin Dicumarol Medipren Supac  Analgesic (Safety coated) Arthralgen Diffunasal Mefanamic Suprofen  Arthritis Strength Bufferin Dihydrocodeine Mepro Compound Suprol  Arthropan liquid Dopirydamole Methcarbomol with Aspirin Synalgos  ASA tablets/Enseals Disalcid Micrainin Tagament  Ascriptin Doan's Midol Talwin  Ascriptin A/D Dolene Mobidin Tanderil   Ascriptin Extra Strength Dolobid Moblgesic Ticlid  Ascriptin with Codeine Doloprin or Doloprin with Codeine Momentum Tolectin  Asperbuf Duoprin Mono-gesic Trendar  Aspergum Duradyne Motrin or Motrin IB Triminicin  Aspirin plain, buffered or enteric coated Durasal Myochrisine Trigesic  Aspirin Suppositories Easprin Nalfon Trillsate  Aspirin with Codeine Ecotrin Regular or Extra Strength Naprosyn Uracel  Atromid-S Efficin Naproxen Ursinus  Auranofin Capsules Elmiron Neocylate Vanquish  Axotal Emagrin Norgesic Verin  Azathioprine Empirin or Empirin with Codeine Normiflo Vitamin E  Azolid Emprazil Nuprin Voltaren  Bayer Aspirin plain, buffered or children's or timed BC Tablets or powders Encaprin Orgaran Warfarin Sodium  Buff-a-Comp Enoxaparin Orudis Zorpin  Buff-a-Comp with Codeine Equegesic Os-Cal-Gesic   Buffaprin Excedrin plain, buffered or Extra Strength Oxalid   Bufferin Arthritis Strength Feldene Oxphenbutazone   Bufferin plain or Extra Strength Feldene Capsules Oxycodone with Aspirin   Bufferin with Codeine Fenoprofen Fenoprofen Pabalate or Pabalate-SF   Buffets II Flogesic Panagesic   Buffinol plain or Extra Strength Florinal or Florinal with Codeine Panwarfarin   Buf-Tabs Flurbiprofen Penicillamine   Butalbital Compound Four-way cold tablets Penicillin   Butazolidin Fragmin Pepto-Bismol   Carbenicillin Geminisyn Percodan   Carna Arthritis Reliever Geopen Persantine   Carprofen Gold's salt Persistin   Chloramphenicol Goody's Phenylbutazone   Chloromycetin Haltrain Piroxlcam   Clmetidine heparin Plaquenil   Cllnoril Hyco-pap Ponstel   Clofibrate Hydroxy chloroquine Propoxyphen         Before stopping any of these medications, be sure to consult the physician who ordered them.  Some, such as Coumadin (Warfarin) are ordered to prevent or treat serious conditions such as "deep thrombosis", "pumonary embolisms", and other heart problems.  The amount of time that you may need off  of the medication may also vary with the medication and the reason for which you were taking it.  If you are taking any of these medications, please make sure you notify your pain physician before you undergo any procedures.      Continue present medications  Lumbar facet, medial branch nerve, blocks to be performed Wednesday, 01/01/2015. As discussed, you will need to stop your Plavix and aspirin for 4 days for the procedure if Dr. Doy Hutching will allow  you to stop the Plavix and aspirin  F/U PCP Dr. Doy Hutching for evaliation of  BP diabetes mellitus and general medical  condition.  F/U surgical evaluation with Dr. Velna Ochs   F/U neurological evaluation  May consider radiofrequency rhizolysis or intraspinal procedures pending response to present treatment and F/U evaluation.  Patient to call [facility] Center should patient have concerns prior to scheduled return appointment.

## 2014-12-25 NOTE — Progress Notes (Signed)
Subjective:    Patient ID: Dorothy Rogers, female    DOB: 1950/11/15, 64 y.o.   MRN: [id number]  HPI  Patient is 64 year old female returns to [facility] for further evaluation and treatment of pain involving the lower back and lower extremity region predominantly. Patient is with prior surgical interventions of the lumbar region and is undergone follow-up evaluation with Dr. Velna Ochs as well. Additional surgery at this time. Patient is with prior fusion and is been discussed regarding need for additional fusion. At the present time we will proceed with procedure performing lumbar facet, medial branch nerve, blocks and attempt to decrease severity of symptoms, retard progression of patient's symptoms, and avoid the need for more involved treatment. The patient was understanding and agreement with suggested treatment plan.     Review of Systems     Objective:   Physical Exam  There was mild tenderness over splenius capitis occipitalis musculature region. Palpation over the cervical facet cervical paraspinal musculature region reproduced mild discomfort. There was unremarkable Spurling's maneuver. Palpation of thoracic facet thoracic paraspinal musculature region was with minimal tenderness to palpation. There was minimal tenderness of the acromioclavicular glenohumeral joint region. Patient appeared to be with bilaterally equal grip strength. Tinel and Phalen's maneuver without increased pain significant degree. There was tends to palpation over the lumbar paraspinal muscular region lumbar facet region palpation which reproduces severe pain with rotation reproducing severe pain as well. Palpation of the PSIS and PII S regions reproduced pain of moderate degree. There was mild tenderness of the greater trochanteric region iliotibial band region. Straight leg raising tolerates approximately 30 without increased pain dorsiflexion noted. Negative clonus negative Homans. Dominant pain  reproduced with rotation of the lumbar spine and extension and palpation of the lumbar facets reproducing severe disabling pain.  Assessment      Assessment & Plan:   Progress Notes   Janene Harvey (MR# [id number])      Progress Notes Info    Author Note Status Last Update User Last Update Date/Time   Progress Notes   Lorenna Lurry (MR# [id number])      Progress Notes Info    Author Note Status Last Update User Last Update Date/Time   Mohammed Kindle, MD Signed Mohammed Kindle, MD 11/18/2014 6:10 PM    Progress Notes    Expand All Collapse All     Subjective:    Patient ID: Dorothy Rogers, female DOB: 10/10/1950, 64 y.o. MRN: [id number]  HPI  the patient is a 64 year old female who comes to pain management Center at the request of [facility] for further evaluation and treatment of pain involving the lower back lower extremity region. The patient is status post surgical intervention of the lumbar region 3. Patient is undergone evaluation by Dr. Napoleon Form was recommended patient comes to pain management Center for further treatment of her condition. We discussed patient's condition on today's visit. Patient described her pain as aching burning deep sensation with feeling of constriction and pressure-like sensation involving the buttocks on the left predominantly. The pain occasionally awakens patient from sleep and is aggravated by walking working any motion sitting standing and lifting climbing bending. Patient states that the pain has decreased with hot packs resting sitting sleeping use of brace physical therapy. We discussed patient's overall condition and will consider patient for interventional treatment at time return appointment. We will proceed with [place] and sciatic nerve block at time of return appointment. We will also discussed patient being candidate for ilioinguinal ,  iliohypogastric, genitofemoral nerve blocks as well as interventional treatment  of the spine. We will observe response to cluneal and sciatic nerve block, consider patient for additional modification of treatment pending response to cluneal sciatic nerve block. The patient was understanding and agreed with suggested treatment plan.      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 Palpation of the cervical facet thoracic facet regions were with tenderness to palpation of mild degree. Palpation of the acromioclavicular and glenohumeral joint regions were with mild discomfort. Tinel and Phalen's maneuver were without increase of pain of significant degree. There was no crepitus of the thoracic region noted. Palpation of the lower thoracic paraspinal musculature region was with evidence of mild to moderate muscle spasm. Palpation over the lumbar paraspinal musculature region lumbar facet region was a tends to palpation of moderate degree left greater than the right. Lateral bending and rotation and extension and palpation of the lumbar facets reproduce moderate to moderately severe discomfort period there was significant increase of pain with palpation of the PSIS PSIS region. Palpation of the gluteal and piriformis musculature regions reproduced moderate to moderately severe pain. Straight leg Raising was tolerates approximately 20 without increase of pain with dorsiflexion noted. There was negative clonus negative Homans. Palpation of the  greater trochanteric region iliotibial band region was a tends to palpation of mild degree. Negative clonus negative Homans. Abdomen nontender and no costovertebral angle tenderness noted.       Assessment & Plan:  Degenerative disc disease lumbar spine Post surgical intervention lumbar region 3 Status post posterior fusion L3 L4 L5 and S1 Grade 1 retrolisthesis of L1 on L2, L2-L3 , and L3 on L4 without significant change from prior study Rod and screw devices transfixing L3, L4, and L5 vertebral with by pedicular approach disc cage seen at the L5-S1 level. 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  Lumbar facet, medial branch nerve blocks, to be performed at time return appointment  F/U PCP Dr. Doy Hutching for evaliation of BP diabetes mellitus and general medical condition.  F/U surgical evaluation. Patient will follow-up with Dr. Velna Ochs in this regard   F/U neurological evaluation.  Will consider TENS unit as discussed pending further assessment of patient's condition  May consider radiofrequency rhizolysis or intraspinal procedures pending response to present treatment and F/U evaluation.  Patient to call Pain Management Center should patient have concerns prior to scheduled return appointment.

## 2015-01-01 ENCOUNTER — Encounter: Payer: Self-pay | Admitting: Pain Medicine

## 2015-01-01 ENCOUNTER — Ambulatory Visit: Payer: BLUE CROSS/BLUE SHIELD | Attending: Pain Medicine | Admitting: Pain Medicine

## 2015-01-01 VITALS — BP 114/52 | HR 75 | Temp 98.9°F | Resp 16 | Ht 62.5 in | Wt 186.0 lb

## 2015-01-01 DIAGNOSIS — M4316 Spondylolisthesis, lumbar region: Secondary | ICD-10-CM | POA: Insufficient documentation

## 2015-01-01 DIAGNOSIS — M5136 Other intervertebral disc degeneration, lumbar region: Secondary | ICD-10-CM | POA: Diagnosis not present

## 2015-01-01 DIAGNOSIS — Z9889 Other specified postprocedural states: Secondary | ICD-10-CM | POA: Insufficient documentation

## 2015-01-01 DIAGNOSIS — M545 Low back pain: Secondary | ICD-10-CM | POA: Diagnosis present

## 2015-01-01 DIAGNOSIS — M961 Postlaminectomy syndrome, not elsewhere classified: Secondary | ICD-10-CM

## 2015-01-01 DIAGNOSIS — M47816 Spondylosis without myelopathy or radiculopathy, lumbar region: Secondary | ICD-10-CM

## 2015-01-01 DIAGNOSIS — Z981 Arthrodesis status: Secondary | ICD-10-CM

## 2015-01-01 DIAGNOSIS — M79604 Pain in right leg: Secondary | ICD-10-CM | POA: Diagnosis present

## 2015-01-01 DIAGNOSIS — M5126 Other intervertebral disc displacement, lumbar region: Secondary | ICD-10-CM | POA: Diagnosis not present

## 2015-01-01 DIAGNOSIS — G5702 Lesion of sciatic nerve, left lower limb: Secondary | ICD-10-CM

## 2015-01-01 DIAGNOSIS — M79605 Pain in left leg: Secondary | ICD-10-CM | POA: Diagnosis present

## 2015-01-01 MED ORDER — CEFUROXIME AXETIL 250 MG PO TABS: 250.0000 mg | ORAL_TABLET | Freq: Two times a day (BID) | ORAL | Status: DC

## 2015-01-01 MED ORDER — TRIAMCINOLONE ACETONIDE 40 MG/ML IJ SUSP
INTRAMUSCULAR | Status: AC
  Administered 2015-01-01: 40 mg
  Filled 2015-01-01: qty 1

## 2015-01-01 MED ORDER — MIDAZOLAM HCL 5 MG/5ML IJ SOLN
INTRAMUSCULAR | Status: AC
  Administered 2015-01-01: 5 mg via INTRAVENOUS
  Filled 2015-01-01: qty 5

## 2015-01-01 MED ORDER — ORPHENADRINE CITRATE 30 MG/ML IJ SOLN
INTRAMUSCULAR | Status: AC
  Administered 2015-01-01: 60 mg
  Filled 2015-01-01: qty 2

## 2015-01-01 MED ORDER — CEFAZOLIN SODIUM 1 G IJ SOLR
INTRAMUSCULAR | Status: AC
  Administered 2015-01-01: 1 g via INTRAVENOUS
  Filled 2015-01-01: qty 10

## 2015-01-01 MED ORDER — FENTANYL CITRATE (PF) 100 MCG/2ML IJ SOLN
INTRAMUSCULAR | Status: AC
  Filled 2015-01-01: qty 2

## 2015-01-01 MED ORDER — BUPIVACAINE HCL (PF) 0.25 % IJ SOLN
INTRAMUSCULAR | Status: AC
  Administered 2015-01-01: 30 mL
  Filled 2015-01-01: qty 30

## 2015-01-01 NOTE — Patient Instructions (Addendum)
Continue present medications and antibiotics. Please obtain your antibiotic Ceftin today and begin taking antibiotic today  F/U PCP Dr. Doy Hutching for evaliation of  BP and general medical  condition.  F/U surgical evaluation as discussed  F/U neurological evaluation.  May consider radiofrequency rhizolysis or intraspinal procedures pending response to present treatment and F/U evaluation.  Patient to call [facility] should patient have concerns prior to scheduled return appointment.  Pain Management Discharge Instructions  General Discharge Instructions :  If you need to reach your doctor call: Monday-Friday 8:00 am - 4:00 pm at [phone] or toll free [phone].  After clinic hours [phone] to have operator reach doctor.  Bring all of your medication bottles to all your appointments in the pain clinic.  To cancel or reschedule your appointment with Pain Management please remember to call 24 hours in advance to avoid a fee.  Refer to the educational materials which you have been given on: General Risks, I had my Procedure. Discharge Instructions, Post Sedation.  Post Procedure Instructions:  The drugs you were given will stay in your system until tomorrow, so for the next 24 hours you should not drive, make any legal decisions or drink any alcoholic beverages.  You may eat anything you prefer, but it is better to start with liquids then soups and crackers, and gradually work up to solid foods.  Please notify your doctor immediately if you have any unusual bleeding, trouble breathing or pain that is not related to your normal pain.  Depending on the type of procedure that was done, some parts of your body may feel week and/or numb.  This usually clears up by tonight or the next day.  Walk with the use of an assistive device or accompanied by an adult for the 24 hours.  You may use ice on the affected area for the first 24 hours.  Put ice in a Ziploc bag and cover  with a towel and place against area 15 minutes on 15 minutes off.  You may switch to heat after 24 hours.Facet Joint Block The facet joints connect the bones of the spine (vertebrae). They make it possible for you to bend, twist, and make other movements with your spine. They also prevent you from overbending, overtwisting, and making other excessive movements.  A facet joint block is a procedure where a numbing medicine (anesthetic) is injected into a facet joint. Often, a type of anti-inflammatory medicine called a steroid is also injected. A facet joint block may be done for two reasons:   Diagnosis. A facet joint block may be done as a test to see whether neck or back pain is caused by a worn-down or infected facet joint. If the pain gets better after a facet joint block, it means the pain is probably coming from the facet joint. If the pain does not get better, it means the pain is probably not coming from the facet joint.   Therapy. A facet joint block may be done to relieve neck or back pain caused by a facet joint. A facet joint block is only done as a therapy if the pain does not improve with medicine, exercise programs, physical therapy, and other forms of pain management. LET [facility] CARE PROVIDER KNOW ABOUT:   Any allergies you have.   All medicines you are taking, including vitamins, herbs, eyedrops, and over-the-counter medicines and creams.   Previous problems you or members of your family have had with the use of anesthetics.  Any blood disorders you have had.   Other health problems you have. RISKS AND COMPLICATIONS Generally, having a facet joint block is safe. However, as with any procedure, complications can occur. Possible complications associated with having a facet joint block include:   Bleeding.   Injury to a nerve near the injection site.   Pain at the injection site.   Weakness or numbness in areas controlled by nerves near the injection site.    Infection.   Temporary fluid retention.   Allergic reaction to anesthetics or medicines used during the procedure. BEFORE THE PROCEDURE   Follow your health care provider's instructions if you are taking dietary supplements or medicines. You may need to stop taking them or reduce your dosage.   Do not take any new dietary supplements or medicines without asking your health care provider first.   Follow your health care provider's instructions about eating and drinking before the procedure. You may need to stop eating and drinking several hours before the procedure.   Arrange to have an adult drive you home after the procedure. PROCEDURE  You may need to remove your clothing and dress in an open-back gown so that your health care provider can access your spine.   The procedure will be done while you are lying on an X-ray table. Most of the time you will be asked to lie on your stomach, but you may be asked to lie in a different position if an injection will be made in your neck.   Special machines will be used to monitor your oxygen levels, heart rate, and blood pressure.   If an injection will be made in your neck, an intravenous (IV) tube will be inserted into one of your veins. Fluids and medicine will flow directly into your body through the IV tube.   The area over the facet joint where the injection will be made will be cleaned with an antiseptic soap. The surrounding skin will be covered with sterile drapes.   An anesthetic will be applied to your skin to make the injection area numb. You may feel a temporary stinging or burning sensation.   A video X-ray machine will be used to locate the joint. A contrast dye may be injected into the facet joint area to help with locating the joint.   When the joint is located, an anesthetic medicine will be injected into the joint through the needle.   Your health care provider will ask you whether you feel pain relief. If  you do feel relief, a steroid may be injected to provide pain relief for a longer period of time. If you do not feel relief or feel only partial relief, additional injections of an anesthetic may be made in other facet joints.   The needle will be removed, the skin will be cleansed, and bandages will be applied.  AFTER THE PROCEDURE   You will be observed for 15-30 minutes before being allowed to go home. Do not drive. Have an adult drive you or take a taxi or public transportation instead.   If you feel pain relief, the pain will return in several hours or days when the anesthetic wears off.   You may feel pain relief 2-14 days after the procedure. The amount of time this relief lasts varies from person to person.   It is normal to feel some tenderness over the injected area(s) for 2 days following the procedure.   If you have diabetes, you may have a  temporary increase in blood sugar. Document Released: 10/06/2006 Document Revised: 10/01/2013 Document Reviewed: 03/06/2012 [facility] Patient Information 2015 [place], [place]. This information is not intended to replace advice given to you by your health care provider. Make sure you discuss any questions you have with your health care provider.

## 2015-01-01 NOTE — Progress Notes (Signed)
Safety precautions to be maintained throughout the outpatient stay will include: orient to surroundings, keep bed in low position, maintain call bell within reach at all times, provide assistance with transfer out of bed and ambulation.  

## 2015-01-01 NOTE — Progress Notes (Signed)
Subjective:    Patient ID: Dorothy Rogers, female    DOB: 1950-12-16, 64 y.o.   MRN: [id number]  HPI  PROCEDURE PERFORMED: Lumbar facet (medial branch block)   NOTE: The patient is a 64 y.o. female who returns to [facility] for further evaluation and treatment of pain involving the lumbar and lower extremity region. MRI  revealed the patient to be with evidence of degenerative disc disease lumbar spine Post surgical intervention lumbar region 3 Status post posterior fusion L3 L4 L5 and S1 Grade 1 retrolisthesis of L1 on L2, L2-L3 , and L3 on L4 without significant change from prior study Rod and screw devices transfixing L3, L4, and L5 vertebral with by pedicular approach disc cage seen at the L5-S1 level. Prominent marginal osteophytes with moderate spondylosis throughout the lumbar spin. The risks, benefits, and expectations of the procedure have been discussed and explained to the patient who was understanding and in agreement with suggested treatment plan. We will proceed with interventional treatment as discussed and as explained to the patient who was understanding and wished to proceed with procedure as planned.   DESCRIPTION OF PROCEDURE: Lumbar facet (medial branch block) with IV Versed, IV fentanyl conscious sedation, EKG, blood pressure, pulse, and pulse oximetry monitoring. The procedure was performed with the patient in the prone position. Betadine prep of proposed entry site performed.   NEEDLE PLACEMENT AT: Left L 1 lumbar facet (medial branch block). Under fluoroscopic guidance with oblique orientation of 15 degrees, a 22-gauge needle was inserted at the L 1 vertebral body level with needle placed at the targeted area of Burton's Eye or Eye of the Scotty Dog with documentation of needle placement in the superior and lateral border of targeted area of Burton's Eye or Eye of the Scotty Dog with oblique orientation of 15 degrees. Following documentation of needle  placement at the L 1 vertebral body level, needle placement was then accomplished at the L 2 vertebral body level.   NEEDLE PLACEMENT AT L2 AND L3 VERTEBRAL BODY LEVELS ON THE LEFT SIDE The procedure was performed at the L2 and L3 vertebral body levels exactly as was performed at the L 1 vertebral body level utilizing the same technique and under fluoroscopic guidance.  NEEDLE PLACEMENT AT THE SACRAL ALA with AP view of the lumbosacral spine. With the patient in the prone position, Betadine prep of proposed entry site accomplished, a 22 gauge needle was inserted in the region of the sacral ala (groove formed by the superior articulating process of S1 and the sacral wing). Following documentation of needle placement at the sacral ala,  needle placement was then accomplished at the S1 foramen level.   NEEDLE PLACEMENT AT THE S1 FORAMEN LEVEL under fluoroscopic guidance with AP view of the lumbosacral spine and cephalad orientation of the fluoroscope, a 22-gauge needle was placed at the superior and lateral border of the S1 foramen under fluoroscopic guidance. Following documentation of needle placement at the S1 foramen.   Needle placement was then verified at all levels on lateral view. Following documentation of needle placement at all levels on lateral view and following negative aspiration for heme and CSF, each level was injected with 1 mL of 0.25% bupivacaine with Kenalog.     LUMBAR FACET, MEDIAL BRANCH NERVE, BLOCKS PERFORMED ON THE RIGHT SIDE   The procedure was performed on the right side exactly as was performed on the left side at the same levels and utilizing the same technique under fluoroscopic  guidance.     The patient tolerated the procedure well. A total of 40 mg of Kenalog was utilized for the procedure.   PLAN:  1. Medications: The patient will continue presently prescribed medications.. We will consider modification of medications pending response to treatment and  follow-up evaluation 2. May consider modification of treatment regimen at time of return appointment pending response to treatment rendered on today's visit. 3. The patient is to follow-up with primary care physician Dr. Doy Hutching for further evaluation of blood pressure and general medical condition status post steroid injection performed on today's visit. 4. Surgical follow-up evaluation   We will consider further surgical evaluation as discussed with patient. 5. Neurological follow-up evaluation. 6. The patient may be candidate for radiofrequency procedures, implantation type procedures, and other treatment pending response to treatment and follow-up evaluation. 7. The patient has been advised to call the Pain Management Center prior to scheduled return appointment should there be significant change in condition or should patient have other concerns regarding condition prior to scheduled return appointment.  The patient is understanding and in agreement with suggested treatment plan.     Review of Systems     Objective:   Physical Exam        Assessment & Plan:

## 2015-01-02 ENCOUNTER — Telehealth: Payer: Self-pay | Admitting: *Deleted

## 2015-01-02 NOTE — Telephone Encounter (Signed)
Message left

## 2015-02-04 ENCOUNTER — Ambulatory Visit: Payer: BLUE CROSS/BLUE SHIELD | Admitting: Pain Medicine

## 2015-02-04 ENCOUNTER — Ambulatory Visit: Payer: BLUE CROSS/BLUE SHIELD | Attending: Pain Medicine | Admitting: Pain Medicine

## 2015-02-04 ENCOUNTER — Encounter: Payer: Self-pay | Admitting: Pain Medicine

## 2015-02-04 VITALS — BP 135/69 | HR 95 | Temp 98.5°F | Resp 18 | Ht 62.0 in | Wt 185.0 lb

## 2015-02-04 DIAGNOSIS — Z981 Arthrodesis status: Secondary | ICD-10-CM | POA: Diagnosis not present

## 2015-02-04 DIAGNOSIS — M2578 Osteophyte, vertebrae: Secondary | ICD-10-CM | POA: Insufficient documentation

## 2015-02-04 DIAGNOSIS — M79605 Pain in left leg: Secondary | ICD-10-CM | POA: Diagnosis present

## 2015-02-04 DIAGNOSIS — Z9889 Other specified postprocedural states: Secondary | ICD-10-CM | POA: Diagnosis not present

## 2015-02-04 DIAGNOSIS — G588 Other specified mononeuropathies: Secondary | ICD-10-CM | POA: Diagnosis not present

## 2015-02-04 DIAGNOSIS — M4316 Spondylolisthesis, lumbar region: Secondary | ICD-10-CM | POA: Insufficient documentation

## 2015-02-04 DIAGNOSIS — M533 Sacrococcygeal disorders, not elsewhere classified: Secondary | ICD-10-CM | POA: Insufficient documentation

## 2015-02-04 DIAGNOSIS — M961 Postlaminectomy syndrome, not elsewhere classified: Secondary | ICD-10-CM

## 2015-02-04 DIAGNOSIS — G5710 Meralgia paresthetica, unspecified lower limb: Secondary | ICD-10-CM | POA: Diagnosis not present

## 2015-02-04 DIAGNOSIS — M47816 Spondylosis without myelopathy or radiculopathy, lumbar region: Secondary | ICD-10-CM

## 2015-02-04 DIAGNOSIS — M545 Low back pain: Secondary | ICD-10-CM | POA: Diagnosis present

## 2015-02-04 DIAGNOSIS — M5136 Other intervertebral disc degeneration, lumbar region: Secondary | ICD-10-CM | POA: Diagnosis not present

## 2015-02-04 DIAGNOSIS — G5702 Lesion of sciatic nerve, left lower limb: Secondary | ICD-10-CM

## 2015-02-04 DIAGNOSIS — M79604 Pain in right leg: Secondary | ICD-10-CM | POA: Diagnosis present

## 2015-02-04 NOTE — Progress Notes (Signed)
   Subjective:    Patient ID: Dorothy Rogers, female    DOB: 07-22-1950, 64 y.o.   MRN: [id number]  HPI  Patient is 64 year old female who [facility] for further evaluation and treatment of pain involving the lower back and lower extremity regions. The patient is with prior surgery of the lumbar region 3. Patient continues to be with significant pain involving the lumbosacral region. We discussed patient's condition and the present time recommend patient be considered for sacroiliac joint injections. Patient will follow-up Dr.Derian for further surgical evaluation as planned. We will remain available to proceed with sacroiliac joint injections in attempt to decrease severity of patient's symptoms, minimize progression of patient's symptoms, and hopefully avoid the need for more involved treatment. The patient was in agreement with suggested treatment plan and will inform us of her land to proceed with procedure are to wait evaluation with Dr. Velna Ochs. All understanding and agreement with suggested treatment plan      Review of Systems     Objective:   Physical Exam There was tenderness of the splenius capitis and occipitalis regions of mild degree. There was mild to moderate tenderness over the cervical facet cervical paraspinal muscular region and thoracic facet thoracic paraspinal muscles region. There was unremarkable Spurling's maneuver. Tinel and Phalen's maneuver associated with mild discomfort. There was tenderness to palpation over the region of the thoracic facet thoracic paraspinal muscles region with no crepitus of the thoracic region noted. Palpation over the lower thoracic paraspinal muscles region was with moderate tends to palpation. Palpation over the lumbar paraspinal muscles lumbar facet region was with moderate to moderately severe discomfort. Lateral bending and rotation and extension and palpation of the lumbar facets reproduce moderately severe  discomfort. There was severe tends to palpation of the PSIS and PII S region on the left noted. Straight leg raising was tolerated to 20 without a definite increased pain with dorsiflexion noted. There appeared to be negative clonus negative Homans. DTRs difficult to elicit patient had difficulty relaxing. Abdomen soft nontender and no costovertebral maintenance was noted.     Assessment & Plan:     Degenerative disc disease lumbar spine Post surgical intervention lumbar region 3 Status post posterior fusion L3 L4 L5 and S1 Grade 1 retrolisthesis of L1 on L2, L2-L3 , and L3 on L4 without significant change from prior study Rod and screw devices transfixing L3, L4, and L5 vertebral with by pedicular approach disc cage seen at the L5-S1 level. 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 Patient will call to schedule if she decides to proceed with the sacroiliac joint injection  F/U PCP Dr. Doy Hutching for evaliation of  BP diabetes mellitus and general medical  condition  F/U surgical evaluation.. Follow-up with Dr. Velna Ochs as planned  F/U neurological evaluation. May consider pending follow-up evaluations  May consider radiofrequency rhizolysis or intraspinal procedures pending response to present treatment and F/U evaluation   Patient to call Pain Management Center should patient have concerns prior to scheduled return appointment.

## 2015-02-04 NOTE — Patient Instructions (Addendum)
PLAN   Continue present medications  Sacroiliac joint injection to be performed at time of return appointment if you decide to proceed with injection of the sacroiliac joint  F/U PCP Dr. Doy Hutching  for evaliation of  BP and general medical  condition  F/U surgical evaluation. Follow-up with Dr.Derian as discussed   F/U neurological evaluation. May consider pending follow-up evaluations  May consider radiofrequency rhizolysis or intraspinal procedures pending response to present treatment and F/U evaluation   Patient to call Pain Management Center should patient have concerns prior to scheduled return appointment.

## 2015-02-10 ENCOUNTER — Encounter: Payer: Self-pay | Admitting: Pain Medicine

## 2015-02-10 ENCOUNTER — Ambulatory Visit: Payer: BLUE CROSS/BLUE SHIELD | Attending: Pain Medicine | Admitting: Pain Medicine

## 2015-02-10 VITALS — BP 109/58 | HR 88 | Temp 98.5°F | Resp 14 | Ht 62.5 in | Wt 185.0 lb

## 2015-02-10 DIAGNOSIS — M47816 Spondylosis without myelopathy or radiculopathy, lumbar region: Secondary | ICD-10-CM | POA: Diagnosis not present

## 2015-02-10 DIAGNOSIS — M5136 Other intervertebral disc degeneration, lumbar region: Secondary | ICD-10-CM | POA: Diagnosis not present

## 2015-02-10 DIAGNOSIS — Z981 Arthrodesis status: Secondary | ICD-10-CM | POA: Insufficient documentation

## 2015-02-10 DIAGNOSIS — M79604 Pain in right leg: Secondary | ICD-10-CM | POA: Diagnosis present

## 2015-02-10 DIAGNOSIS — M961 Postlaminectomy syndrome, not elsewhere classified: Secondary | ICD-10-CM

## 2015-02-10 DIAGNOSIS — M533 Sacrococcygeal disorders, not elsewhere classified: Secondary | ICD-10-CM | POA: Insufficient documentation

## 2015-02-10 DIAGNOSIS — M4316 Spondylolisthesis, lumbar region: Secondary | ICD-10-CM | POA: Insufficient documentation

## 2015-02-10 DIAGNOSIS — M2578 Osteophyte, vertebrae: Secondary | ICD-10-CM | POA: Insufficient documentation

## 2015-02-10 DIAGNOSIS — M542 Cervicalgia: Secondary | ICD-10-CM | POA: Diagnosis present

## 2015-02-10 DIAGNOSIS — M79605 Pain in left leg: Secondary | ICD-10-CM | POA: Diagnosis present

## 2015-02-10 DIAGNOSIS — G5702 Lesion of sciatic nerve, left lower limb: Secondary | ICD-10-CM

## 2015-02-10 MED ORDER — ORPHENADRINE CITRATE 30 MG/ML IJ SOLN
INTRAMUSCULAR | Status: AC
Start: 2015-02-10 — End: 2015-02-10
  Administered 2015-02-10: 60 mg
  Filled 2015-02-10: qty 2

## 2015-02-10 MED ORDER — TRIAMCINOLONE ACETONIDE 40 MG/ML IJ SUSP
INTRAMUSCULAR | Status: AC
  Filled 2015-02-10: qty 1

## 2015-02-10 MED ORDER — CEFUROXIME AXETIL 250 MG PO TABS: 250.0000 mg | ORAL_TABLET | Freq: Two times a day (BID) | ORAL | Status: DC

## 2015-02-10 MED ORDER — BUPIVACAINE HCL (PF) 0.5 % IJ SOLN
INTRAMUSCULAR | Status: AC
  Administered 2015-02-10: 15 mL
  Filled 2015-02-10: qty 30

## 2015-02-10 MED ORDER — FENTANYL CITRATE (PF) 100 MCG/2ML IJ SOLN
INTRAMUSCULAR | Status: AC
  Administered 2015-02-10: 50 ug via INTRAVENOUS
  Filled 2015-02-10: qty 2

## 2015-02-10 MED ORDER — BUPIVACAINE HCL (PF) 0.25 % IJ SOLN
INTRAMUSCULAR | Status: AC
  Filled 2015-02-10: qty 30

## 2015-02-10 MED ORDER — CEFAZOLIN SODIUM 1 G IJ SOLR
INTRAMUSCULAR | Status: AC
  Administered 2015-02-10: 1 g via INTRAVENOUS
  Filled 2015-02-10: qty 10

## 2015-02-10 MED ORDER — KETOROLAC TROMETHAMINE 30 MG/ML IJ SOLN
INTRAMUSCULAR | Status: AC
  Administered 2015-02-10: 30 mg
  Filled 2015-02-10: qty 1

## 2015-02-10 MED ORDER — MIDAZOLAM HCL 5 MG/5ML IJ SOLN
INTRAMUSCULAR | Status: AC
Start: 2015-02-10 — End: 2015-02-10
  Administered 2015-02-10: 3 mg via INTRAVENOUS
  Filled 2015-02-10: qty 5

## 2015-02-10 NOTE — Patient Instructions (Addendum)
PLAN  Continue present medication and begin taking antibiotic Ceftin as prescribed. Please obtain your antibiotic Ceftin today and begin taking antibiotic today  F/U PCP Dr. Doy Hutching for evaliation of  BP and general medical  condition.  F/U surgical evaluation. May consider pending follow-up evaluations  F/U neurological evaluation. May consider pending follow-up evaluations  May consider radiofrequency rhizolysis or intraspinal procedures pending response to present treatment and F/U evaluation.  Patient to call Pain Management Center should patient have concerns prior to scheduled return appointment.  Pain Management Discharge Instructions  General Discharge Instructions :  If you need to reach your doctor call: Monday-Friday 8:00 am - 4:00 pm at [phone] or toll free [phone].  After clinic hours [phone] to have operator reach doctor.  Bring all of your medication bottles to all your appointments in the pain clinic.  To cancel or reschedule your appointment with Pain Management please remember to call 24 hours in advance to avoid a fee.  Refer to the educational materials which you have been given on: General Risks, I had my Procedure. Discharge Instructions, Post Sedation.  Post Procedure Instructions:  The drugs you were given will stay in your system until tomorrow, so for the next 24 hours you should not drive, make any legal decisions or drink any alcoholic beverages.  You may eat anything you prefer, but it is better to start with liquids then soups and crackers, and gradually work up to solid foods.  Please notify your doctor immediately if you have any unusual bleeding, trouble breathing or pain that is not related to your normal pain.  Depending on the type of procedure that was done, some parts of your body may feel week and/or numb.  This usually clears up by tonight or the next day.  Walk with the use of an assistive device or accompanied by an adult  for the 24 hours.  You may use ice on the affected area for the first 24 hours.  Put ice in a Ziploc bag and cover with a towel and place against area 15 minutes on 15 minutes off.  You may switch to heat after 24 hours.Sacroiliac (SI) Joint Injection Patient Information  Description: The sacroiliac joint connects the scrum (very low back and tailbone) to the ilium (a pelvic bone which also forms half of the hip joint).  Normally this joint experiences very little motion.  When this joint becomes inflamed or unstable low back and or hip and pelvis pain may result.  Injection of this joint with local anesthetics (numbing medicines) and steroids can provide diagnostic information and reduce pain.  This injection is performed with the aid of x-ray guidance into the tailbone area while you are lying on your stomach.   You may experience an electrical sensation down the leg while this is being done.  You may also experience numbness.  We also may ask if we are reproducing your normal pain during the injection.  Conditions which may be treated SI injection:   Low back, buttock, hip or leg pain  Preparation for the Injection:  1. Do not eat any solid food or dairy products within 6 hours of your appointment.  2. You may drink clear liquids up to 2 hours before appointment.  Clear liquids include water, black coffee, juice or soda.  No milk or cream please. 3. You may take your regular medications, including pain medications with a sip of water before your appointment.  Diabetics should hold regular insulin (if take separately)  and take 1/2 normal NPH dose the morning of the procedure.  Carry some sugar containing items with you to your appointment. 4. A driver must accompany you and be prepared to drive you home after your procedure. 5. Bring all of your current medications with you. 6. An IV may be inserted and sedation may be given at the discretion of the physician. 7. A blood pressure cuff, EKG and  other monitors will often be applied during the procedure.  Some patients may need to have extra oxygen administered for a short period.  8. You will be asked to provide medical information, including your allergies, prior to the procedure.  We must know immediately if you are taking blood thinners (like Coumadin/Warfarin) or if you are allergic to IV iodine contrast (dye).  We must know if you could possible be pregnant.  Possible side effects:   Bleeding from needle site  Infection (rare, may require surgery)  Nerve injury (rare)  Numbness & tingling (temporary)  A brief convulsion or seizure  Light-headedness (temporary)  Pain at injection site (several days)  Decreased blood pressure (temporary)  Weakness in the leg (temporary)   Call if you experience:   New onset weakness or numbness of an extremity below the injection site that last more than 8 hours.  Hives or difficulty breathing ( go to the emergency room)  Inflammation or drainage at the injection site  Any new symptoms which are concerning to you  Please note:  Although the local anesthetic injected can often make your back/ hip/ buttock/ leg feel good for several hours after the injections, the pain will likely return.  It takes 3-7 days for steroids to work in the sacroiliac area.  You may not notice any pain relief for at least that one week.  If effective, we will often do a series of three injections spaced 3-6 weeks apart to maximally decrease your pain.  After the initial series, we generally will wait some months before a repeat injection of the same type.  If you have any questions, please call [phone] [facility]

## 2015-02-10 NOTE — Progress Notes (Signed)
Subjective:    Patient ID: Dorothy Rogers, female    DOB: 04/12/1951, 64 y.o.   MRN: [id number]  HPI  NOTE:  The patient is a 64 y.o. female who [facility] the [facility] for further evaluation and treatment of pain involving the lumbar and lower extremity region with pain involving the region of the hip and buttocks as well.  Prior studies reveal patient to be with evidence of MRI evidence of prior surgical intervention of the lumbar region with degenerative disc disease lumbar spine Post surgical intervention lumbar region 3 Status post posterior fusion L3 L4 L5 and S1 Grade 1 retrolisthesis of L1 on L2, L2-L3 , and L3 on L4 without significant change from prior study Rod and screw devices transfixing L3, L4, and L5 vertebral with by pedicular approach disc cage seen at the L5-S1 level. Prominent marginal osteophytes with moderate spondylosis throughout the lumbar spine.  There is concern regarding patient's pain being due to significant component of sacroiliac joint dysfunction. We have discussed patient's condition.  The risks, benefits, and expectations of the procedure have been discussed and explained to the patient who is understanding and wishes to proceed with interventional treatment as planned.    PROCEDURE: Sacroiliac joint on the left side injection with IV Versed, IV Fentanyl conscious sedation, EKG, blood pressure, pulse, pulse oximetry monitoring.  Procedure was performed with patient in prone position under fluoroscopic guidance.    Inferior pole sacroiliac joint injection on the left side.  With patient in prone position and Betadine prep of proposed entry site of accomplished, 22 -gauge needle was inserted at the inferior pole of the sacroiliac joint on the left under fluoroscopic guidance.  Following documentation of needle placement at the inferior pole of the sacroiliac joint on the left, a total of 1cc 0.25% bupivacaine with Toradol was injected for left  inferior pole sacroiliac joint injection.  Needle was removed.    Superior pole sacroiliac joint injection on the left side.  With patient in prone position and Betadine prep of proposed entry site of accomplished, 22-gauge needle was inserted at the superior pole of the sacroiliac joint on the left under fluoroscopic guidance.  Following documentation of needle placement at the inferior pole of the sacroiliac joint on the left, a total of 1cc 0.25% bupivacaine with Toradol was injected for left superior pole sacroiliac joint injection.  Needle was removed.     Patient tolerated procedure well.  A total of 30 mg of Toradol was utilized for the entire procedure.  PLAN:    1. Medications: Will continue presently prescribed medications. 2. Patient to follow up with primary care physician  Dr. Doy Hutching for evaluation of blood pressure and general medical condition status post sacroiliac joint injection performed on today's visit. 3. Surgical evaluation is discussed.. The patient will undergo surgical evaluation is planned 4. Neurological evaluation with PNCV/EMG studies as discussed. 5. Patient may be candidate for radiofrequency procedures, Botox injections, implantation-type procedures, and other treatment pending response to treatment and follow-up evaluation. 6. Patient has been advised to adhere to proper body mechanics and to call [facility] prior to scheduled return appointment should there be significant change in condition or patient have other concerns regarding condition prior to scheduled return appointment.    Patient with understanding and in agreement with suggested treatment plan.      Review of Systems     Objective:   Physical Exam        Assessment &  Plan:

## 2015-02-10 NOTE — Progress Notes (Signed)
Safety precautions to be maintained throughout the outpatient stay will include: orient to surroundings, keep bed in low position, maintain call bell within reach at all times, provide assistance with transfer out of bed and ambulation.  

## 2015-02-11 ENCOUNTER — Telehealth: Payer: Self-pay

## 2015-02-11 NOTE — Telephone Encounter (Signed)
Left message

## 2015-03-11 ENCOUNTER — Ambulatory Visit: Payer: BLUE CROSS/BLUE SHIELD | Admitting: Pain Medicine

## 2015-03-12 ENCOUNTER — Encounter: Payer: Self-pay | Admitting: *Deleted

## 2015-03-17 NOTE — Discharge Instructions (Signed)

## 2015-03-19 ENCOUNTER — Ambulatory Visit: Payer: Medicare Other | Admitting: Anesthesiology

## 2015-03-19 ENCOUNTER — Ambulatory Visit
Admission: RE | Admit: 2015-03-19 | Discharge: 2015-03-19 | Disposition: A | Discharge status: Home / Self Care | Payer: Medicare Other | Source: Ambulatory Visit | Attending: Ophthalmology | Admitting: Ophthalmology

## 2015-03-19 ENCOUNTER — Encounter
Admission: RE | Disposition: A | Discharge status: Home / Self Care | Payer: Self-pay | Source: Ambulatory Visit | Attending: Ophthalmology

## 2015-03-19 ENCOUNTER — Encounter: Payer: Self-pay | Admitting: *Deleted

## 2015-03-19 DIAGNOSIS — Z85828 Personal history of other malignant neoplasm of skin: Secondary | ICD-10-CM | POA: Insufficient documentation

## 2015-03-19 DIAGNOSIS — Z955 Presence of coronary angioplasty implant and graft: Secondary | ICD-10-CM | POA: Diagnosis not present

## 2015-03-19 DIAGNOSIS — E7800 Pure hypercholesterolemia, unspecified: Secondary | ICD-10-CM | POA: Diagnosis not present

## 2015-03-19 DIAGNOSIS — Z7984 Long term (current) use of oral hypoglycemic drugs: Secondary | ICD-10-CM | POA: Insufficient documentation

## 2015-03-19 DIAGNOSIS — I119 Hypertensive heart disease without heart failure: Secondary | ICD-10-CM | POA: Diagnosis not present

## 2015-03-19 DIAGNOSIS — Z794 Long term (current) use of insulin: Secondary | ICD-10-CM | POA: Insufficient documentation

## 2015-03-19 DIAGNOSIS — M1990 Unspecified osteoarthritis, unspecified site: Secondary | ICD-10-CM | POA: Insufficient documentation

## 2015-03-19 DIAGNOSIS — I2510 Atherosclerotic heart disease of native coronary artery without angina pectoris: Secondary | ICD-10-CM | POA: Insufficient documentation

## 2015-03-19 DIAGNOSIS — H2511 Age-related nuclear cataract, right eye: Secondary | ICD-10-CM | POA: Diagnosis not present

## 2015-03-19 DIAGNOSIS — Z79899 Other long term (current) drug therapy: Secondary | ICD-10-CM | POA: Insufficient documentation

## 2015-03-19 DIAGNOSIS — E119 Type 2 diabetes mellitus without complications: Secondary | ICD-10-CM | POA: Diagnosis not present

## 2015-03-19 DIAGNOSIS — Z7982 Long term (current) use of aspirin: Secondary | ICD-10-CM | POA: Diagnosis not present

## 2015-03-19 DIAGNOSIS — Z981 Arthrodesis status: Secondary | ICD-10-CM | POA: Diagnosis not present

## 2015-03-19 DIAGNOSIS — Z885 Allergy status to narcotic agent status: Secondary | ICD-10-CM | POA: Insufficient documentation

## 2015-03-19 DIAGNOSIS — Z91013 Allergy to seafood: Secondary | ICD-10-CM | POA: Diagnosis not present

## 2015-03-19 DIAGNOSIS — Z888 Allergy status to other drugs, medicaments and biological substances status: Secondary | ICD-10-CM | POA: Insufficient documentation

## 2015-03-19 HISTORY — DX: Dizziness and giddiness: R42

## 2015-03-19 HISTORY — PX: CATARACT EXTRACTION W/PHACO: SHX586

## 2015-03-19 LAB — GLUCOSE, CAPILLARY
GLUCOSE-CAPILLARY: 100 mg/dL — AB (ref 65–99)
Glucose-Capillary: 125 mg/dL — ABNORMAL HIGH (ref 65–99)

## 2015-03-19 SURGERY — PHACOEMULSIFICATION, CATARACT, WITH IOL INSERTION
Anesthesia: Monitor Anesthesia Care | Laterality: Right | Wound class: Clean

## 2015-03-19 MED ORDER — NA HYALUR & NA CHOND-NA HYALUR 0.4-0.35 ML IO KIT
PACK | INTRAOCULAR | Status: DC | PRN
  Administered 2015-03-19: 1 mL via INTRAOCULAR

## 2015-03-19 MED ORDER — CEFUROXIME OPHTHALMIC INJECTION 1 MG/0.1 ML
INJECTION | OPHTHALMIC | Status: DC | PRN
  Administered 2015-03-19: 0.1 mL via INTRACAMERAL

## 2015-03-19 MED ORDER — MIDAZOLAM HCL 2 MG/2ML IJ SOLN: INTRAMUSCULAR | Status: DC | PRN

## 2015-03-19 MED ORDER — OXYCODONE HCL 5 MG PO TABS
5.0000 mg | ORAL_TABLET | Freq: Once | ORAL | Status: DC | PRN
Start: 2015-03-19 — End: 2015-03-19

## 2015-03-19 MED ORDER — ONDANSETRON HCL 4 MG/2ML IJ SOLN
4.0000 mg | Freq: Once | INTRAMUSCULAR | Status: AC
Start: 2015-03-19 — End: 2015-03-19
  Administered 2015-03-19: 4 mg via INTRAVENOUS

## 2015-03-19 MED ORDER — ARMC OPHTHALMIC DILATING GEL
1.0000 | OPHTHALMIC | Status: DC | PRN
Start: 2015-03-19 — End: 2015-03-19
  Administered 2015-03-19 (×2): 1 via OPHTHALMIC

## 2015-03-19 MED ORDER — POVIDONE-IODINE 5 % OP SOLN
1.0000 "application " | OPHTHALMIC | Status: DC | PRN
  Administered 2015-03-19: 1 via OPHTHALMIC

## 2015-03-19 MED ORDER — FENTANYL CITRATE (PF) 100 MCG/2ML IJ SOLN: INTRAMUSCULAR | Status: DC | PRN

## 2015-03-19 MED ORDER — FENTANYL CITRATE (PF) 100 MCG/2ML IJ SOLN
INTRAMUSCULAR | Status: DC | PRN
  Administered 2015-03-19 (×2): 50 ug via INTRAVENOUS

## 2015-03-19 MED ORDER — OXYCODONE HCL 5 MG/5ML PO SOLN: 5.0000 mg | Freq: Once | ORAL | Status: DC | PRN

## 2015-03-19 MED ORDER — LACTATED RINGERS IV SOLN: INTRAVENOUS | Status: DC

## 2015-03-19 MED ORDER — BRIMONIDINE TARTRATE 0.2 % OP SOLN
OPHTHALMIC | Status: DC | PRN
  Administered 2015-03-19: 1 [drp] via OPHTHALMIC

## 2015-03-19 MED ORDER — TETRACAINE HCL 0.5 % OP SOLN
1.0000 [drp] | OPHTHALMIC | Status: DC | PRN
  Administered 2015-03-19: 1 [drp] via OPHTHALMIC

## 2015-03-19 MED ORDER — MIDAZOLAM HCL 2 MG/2ML IJ SOLN
INTRAMUSCULAR | Status: DC | PRN
  Administered 2015-03-19 (×2): 1 mg via INTRAVENOUS

## 2015-03-19 MED ORDER — EPINEPHRINE HCL 1 MG/ML IJ SOLN
INTRAMUSCULAR | Status: DC | PRN
  Administered 2015-03-19: 80 mL via OPHTHALMIC

## 2015-03-19 SURGICAL SUPPLY — 26 items
CANNULA ANT/CHMB 27GA (MISCELLANEOUS) ×3 IMPLANT
GLOVE SURG LX 7.5 STRW (GLOVE) ×2
GLOVE SURG LX STRL 7.5 STRW (GLOVE) ×1 IMPLANT
GLOVE SURG TRIUMPH 8.0 PF LTX (GLOVE) ×3 IMPLANT
GOWN STRL REUS W/ TWL LRG LVL3 (GOWN DISPOSABLE) ×2 IMPLANT
GOWN STRL REUS W/TWL LRG LVL3 (GOWN DISPOSABLE) ×4
LENS IOL TECNIS 22.0 (Intraocular Lens) ×3 IMPLANT
LENS IOL TECNIS MONO 1P 22.0 (Intraocular Lens) ×1 IMPLANT
MARKER SKIN SURG W/RULER VIO (MISCELLANEOUS) ×3 IMPLANT
NDL RETROBULBAR .5 NSTRL (NEEDLE) IMPLANT
NEEDLE FILTER BLUNT 18X 1/2SAF (NEEDLE) ×2
NEEDLE FILTER BLUNT 18X1 1/2 (NEEDLE) ×1 IMPLANT
PACK CATARACT BRASINGTON (MISCELLANEOUS) ×3 IMPLANT
PACK EYE AFTER SURG (MISCELLANEOUS) ×3 IMPLANT
PACK OPTHALMIC (MISCELLANEOUS) ×3 IMPLANT
RING MALYGIN 7.0 (MISCELLANEOUS) IMPLANT
SUT ETHILON 10-0 CS-B-6CS-B-6 (SUTURE)
SUT VICRYL  9 0 (SUTURE)
SUT VICRYL 9 0 (SUTURE) IMPLANT
SUTURE EHLN 10-0 CS-B-6CS-B-6 (SUTURE) IMPLANT
SYR 3ML LL SCALE MARK (SYRINGE) ×3 IMPLANT
SYR 5ML LL (SYRINGE) IMPLANT
SYR TB 1ML LUER SLIP (SYRINGE) ×3 IMPLANT
WATER STERILE IRR 250ML POUR (IV SOLUTION) ×3 IMPLANT
WATER STERILE IRR 500ML POUR (IV SOLUTION) IMPLANT
WIPE NON LINTING 3.25X3.25 (MISCELLANEOUS) ×3 IMPLANT

## 2015-03-19 NOTE — Op Note (Signed)
LOCATION:  [facility]   PREOPERATIVE DIAGNOSIS:    Nuclear sclerotic cataract right eye. H25.11   POSTOPERATIVE DIAGNOSIS:  Nuclear sclerotic cataract right eye.     PROCEDURE:  Phacoemusification with posterior chamber intraocular lens placement of the right eye   LENS:   Implant Name Type Inv. Item Serial No. Manufacturer Lot No. LRB No. Used  LENS IMPL INTRAOC [id number] 22.0 - [id number] Intraocular Lens LENS IMPL INTRAOC ZCB00 22.0 [id number] AMO   Right 1        ULTRASOUND TIME: 20 % of 1 minutes, 42 seconds.  CDE 20.7   SURGEON:  Wyonia Hough, MD   ANESTHESIA:  Topical with tetracaine drops and 2% Xylocaine jelly.   COMPLICATIONS:  None.   DESCRIPTION OF PROCEDURE:  The patient was identified in the holding room and transported to the operating room and placed in the supine position under the operating microscope.  The right eye was identified as the operative eye and it was prepped and draped in the usual sterile ophthalmic fashion.   A 1 millimeter clear-corneal paracentesis was made at the 12:00 position.  The anterior chamber was filled with Viscoat viscoelastic.  A 2.4 millimeter keratome was used to make a near-clear corneal incision at the 9:00 position.  A curvilinear capsulorrhexis was made with a cystotome and capsulorrhexis forceps.  Balanced salt solution was used to hydrodissect and hydrodelineate the nucleus.   Phacoemulsification was then used in stop and chop fashion to remove the lens nucleus and epinucleus.  The remaining cortex was then removed using the irrigation and aspiration handpiece. Provisc was then placed into the capsular bag to distend it for lens placement.  A lens was then injected into the capsular bag.  The remaining viscoelastic was aspirated.   Wounds were hydrated with balanced salt solution.  The anterior chamber was inflated to a physiologic pressure with balanced salt solution.  No wound leaks were noted. Cefuroxime 0.1 ml of  a 10mg /ml solution was injected into the anterior chamber for a dose of 1 mg of intracameral antibiotic at the completion of the case.   Timolol and Brimonidine drops were applied to the eye.  The patient was taken to the recovery room in stable condition without complications of anesthesia or surgery.   Sarin Comunale 03/19/2015, 8:39 AM

## 2015-03-19 NOTE — Transfer of Care (Signed)
Immediate Anesthesia Transfer of Care Note  Patient: Dorothy Rogers  Procedure(s) Performed: Procedure(s) with comments: CATARACT EXTRACTION PHACO AND INTRAOCULAR LENS PLACEMENT (IOC) (Right) - DIABETIC - insulin and oral meds  Patient Location: PACU  Anesthesia Type: MAC  Level of Consciousness: awake, alert  and patient cooperative  Airway and Oxygen Therapy: Patient Spontanous Breathing and Patient connected to supplemental oxygen  Post-op Assessment: Post-op Vital signs reviewed, Patient's Cardiovascular Status Stable, Respiratory Function Stable, Patent Airway and No signs of Nausea or vomiting  Post-op Vital Signs: Reviewed and stable  Complications: No apparent anesthesia complications

## 2015-03-19 NOTE — Anesthesia Preprocedure Evaluation (Signed)
Anesthesia Evaluation  Patient identified by MRN, date of birth, ID band Patient awake    Reviewed: Allergy & Precautions, H&P , NPO status , Patient's Chart, lab work & pertinent test results  Airway Mallampati: II  TM Distance: >3 FB Neck ROM: full    Dental   Pulmonary neg pulmonary ROS,    Pulmonary exam normal        Cardiovascular hypertension, On Medications Normal cardiovascular exam     Neuro/Psych    GI/Hepatic negative GI ROS, Neg liver ROS,   Endo/Other  diabetes, Well Controlled, Type 2, Oral Hypoglycemic Agents  Renal/GU      Musculoskeletal   Abdominal   Peds  Hematology   Anesthesia Other Findings   Reproductive/Obstetrics                             Anesthesia Physical Anesthesia Plan  ASA: II  Anesthesia Plan: MAC   Post-op Pain Management:    Induction:   Airway Management Planned:   Additional Equipment:   Intra-op Plan:   Post-operative Plan:   Informed Consent: I have reviewed the patients History and Physical, chart, labs and discussed the procedure including the risks, benefits and alternatives for the proposed anesthesia with the patient or authorized representative who has indicated his/her understanding and acceptance.     Plan Discussed with: CRNA  Anesthesia Plan Comments:         Anesthesia Quick Evaluation

## 2015-03-19 NOTE — Anesthesia Postprocedure Evaluation (Signed)
  Anesthesia Post-op Note  Patient: Dorothy Rogers  Procedure(s) Performed: Procedure(s) with comments: CATARACT EXTRACTION PHACO AND INTRAOCULAR LENS PLACEMENT (IOC) (Right) - DIABETIC - insulin and oral meds  Anesthesia type:MAC  Patient location: PACU  Post pain: Pain level controlled  Post assessment: Post-op Vital signs reviewed, Patient's Cardiovascular Status Stable, Respiratory Function Stable, Patent Airway and No signs of Nausea or vomiting  Post vital signs: Reviewed and stable  Last Vitals:  Filed Vitals:   03/19/15 0855  BP:   Pulse: 89  Temp:   Resp: 11    Level of consciousness: awake, alert  and patient cooperative  Complications: No apparent anesthesia complications

## 2015-03-19 NOTE — H&P (Signed)
  The History and Physical notes are on paper, have been signed, and are to be scanned. The patient remains stable and unchanged from the H&P.   Previous H&P reviewed, patient examined, and there are no changes.  Dorothy Rogers 03/19/2015 7:38 AM

## 2015-03-20 ENCOUNTER — Encounter: Payer: Self-pay | Admitting: Ophthalmology

## 2015-05-06 ENCOUNTER — Other Ambulatory Visit: Payer: Self-pay | Admitting: Internal Medicine

## 2015-05-06 DIAGNOSIS — R112 Nausea with vomiting, unspecified: Secondary | ICD-10-CM

## 2015-05-06 DIAGNOSIS — R1084 Generalized abdominal pain: Secondary | ICD-10-CM

## 2015-05-15 ENCOUNTER — Ambulatory Visit: Payer: Medicare Other

## 2015-05-30 DIAGNOSIS — M5106 Intervertebral disc disorders with myelopathy, lumbar region: Secondary | ICD-10-CM | POA: Insufficient documentation

## 2015-05-30 DIAGNOSIS — Z955 Presence of coronary angioplasty implant and graft: Secondary | ICD-10-CM | POA: Insufficient documentation

## 2015-05-30 DIAGNOSIS — M48061 Spinal stenosis, lumbar region without neurogenic claudication: Secondary | ICD-10-CM | POA: Insufficient documentation

## 2015-05-30 DIAGNOSIS — D696 Thrombocytopenia, unspecified: Secondary | ICD-10-CM | POA: Insufficient documentation

## 2015-05-30 HISTORY — PX: POSTERIOR LUMBAR FUSION: SHX6036

## 2015-06-01 DIAGNOSIS — R0602 Shortness of breath: Secondary | ICD-10-CM

## 2015-06-01 HISTORY — DX: Shortness of breath: R06.02

## 2015-06-07 ENCOUNTER — Emergency Department: Payer: Medicare Other

## 2015-06-07 ENCOUNTER — Emergency Department
Admission: EM | Admit: 2015-06-07 | Discharge: 2015-06-07 | Disposition: A | Discharge status: Home / Self Care | Payer: Medicare Other | Attending: Emergency Medicine | Admitting: Emergency Medicine

## 2015-06-07 ENCOUNTER — Other Ambulatory Visit: Payer: Self-pay

## 2015-06-07 ENCOUNTER — Encounter: Payer: Self-pay | Admitting: Emergency Medicine

## 2015-06-07 DIAGNOSIS — Z794 Long term (current) use of insulin: Secondary | ICD-10-CM | POA: Diagnosis not present

## 2015-06-07 DIAGNOSIS — R42 Dizziness and giddiness: Secondary | ICD-10-CM | POA: Diagnosis not present

## 2015-06-07 DIAGNOSIS — Z792 Long term (current) use of antibiotics: Secondary | ICD-10-CM | POA: Insufficient documentation

## 2015-06-07 DIAGNOSIS — Z7902 Long term (current) use of antithrombotics/antiplatelets: Secondary | ICD-10-CM | POA: Insufficient documentation

## 2015-06-07 DIAGNOSIS — Z79899 Other long term (current) drug therapy: Secondary | ICD-10-CM | POA: Insufficient documentation

## 2015-06-07 DIAGNOSIS — N183 Chronic kidney disease, stage 3 (moderate): Secondary | ICD-10-CM | POA: Insufficient documentation

## 2015-06-07 DIAGNOSIS — Z981 Arthrodesis status: Secondary | ICD-10-CM | POA: Diagnosis not present

## 2015-06-07 DIAGNOSIS — Z791 Long term (current) use of non-steroidal anti-inflammatories (NSAID): Secondary | ICD-10-CM | POA: Insufficient documentation

## 2015-06-07 DIAGNOSIS — R059 Cough, unspecified: Secondary | ICD-10-CM

## 2015-06-07 DIAGNOSIS — Z7984 Long term (current) use of oral hypoglycemic drugs: Secondary | ICD-10-CM | POA: Diagnosis not present

## 2015-06-07 DIAGNOSIS — Z7982 Long term (current) use of aspirin: Secondary | ICD-10-CM | POA: Diagnosis not present

## 2015-06-07 DIAGNOSIS — R110 Nausea: Secondary | ICD-10-CM | POA: Insufficient documentation

## 2015-06-07 DIAGNOSIS — E119 Type 2 diabetes mellitus without complications: Secondary | ICD-10-CM | POA: Diagnosis not present

## 2015-06-07 DIAGNOSIS — R05 Cough: Secondary | ICD-10-CM | POA: Insufficient documentation

## 2015-06-07 DIAGNOSIS — I129 Hypertensive chronic kidney disease with stage 1 through stage 4 chronic kidney disease, or unspecified chronic kidney disease: Secondary | ICD-10-CM | POA: Diagnosis not present

## 2015-06-07 LAB — BASIC METABOLIC PANEL
ANION GAP: 8 (ref 5–15)
BUN: 17 mg/dL (ref 6–20)
CALCIUM: 8.7 mg/dL — AB (ref 8.9–10.3)
CHLORIDE: 108 mmol/L (ref 101–111)
CO2: 24 mmol/L (ref 22–32)
Creatinine, Ser: 1.45 mg/dL — ABNORMAL HIGH (ref 0.44–1.00)
GFR calc non Af Amer: 37 mL/min — ABNORMAL LOW (ref >60)
GFR, EST AFRICAN AMERICAN: 43 mL/min — AB (ref >60)
Glucose, Bld: 111 mg/dL — ABNORMAL HIGH (ref 65–99)
Potassium: 4 mmol/L (ref 3.5–5.1)
Sodium: 140 mmol/L (ref 135–145)

## 2015-06-07 LAB — URINALYSIS COMPLETE WITH MICROSCOPIC (ARMC ONLY)
Bacteria, UA: NONE SEEN
Bilirubin Urine: NEGATIVE
Glucose, UA: NEGATIVE mg/dL
HGB URINE DIPSTICK: NEGATIVE
KETONES UR: NEGATIVE mg/dL
Leukocytes, UA: NEGATIVE
Nitrite: NEGATIVE
PH: 5 (ref 5.0–8.0)
PROTEIN: NEGATIVE mg/dL
SPECIFIC GRAVITY, URINE: 1.015 (ref 1.005–1.030)

## 2015-06-07 LAB — HEPATIC FUNCTION PANEL
ALT: 21 U/L (ref 14–54)
AST: 25 U/L (ref 15–41)
Albumin: 3.3 g/dL — ABNORMAL LOW (ref 3.5–5.0)
Alkaline Phosphatase: 95 U/L (ref 38–126)
BILIRUBIN DIRECT: 0.1 mg/dL (ref 0.1–0.5)
BILIRUBIN INDIRECT: 1.1 mg/dL — AB (ref 0.3–0.9)
Total Bilirubin: 1.2 mg/dL (ref 0.3–1.2)
Total Protein: 7.3 g/dL (ref 6.5–8.1)

## 2015-06-07 LAB — CBC
HCT: 32.9 % — ABNORMAL LOW (ref 35.0–47.0)
HEMOGLOBIN: 11 g/dL — AB (ref 12.0–16.0)
MCH: 30.5 pg (ref 26.0–34.0)
MCHC: 33.5 g/dL (ref 32.0–36.0)
MCV: 90.9 fL (ref 80.0–100.0)
Platelets: 265 10*3/uL (ref 150–440)
RBC: 3.62 MIL/uL — AB (ref 3.80–5.20)
RDW: 13 % (ref 11.5–14.5)
WBC: 6.7 10*3/uL (ref 3.6–11.0)

## 2015-06-07 MED ORDER — ONDANSETRON 4 MG PO TBDP
4.0000 mg | ORAL_TABLET | Freq: Once | ORAL | Status: AC
  Administered 2015-06-07: 4 mg via ORAL
  Filled 2015-06-07: qty 1

## 2015-06-07 MED ORDER — ONDANSETRON HCL 4 MG/2ML IJ SOLN
4.0000 mg | Freq: Once | INTRAMUSCULAR | Status: AC
  Administered 2015-06-07: 4 mg via INTRAVENOUS
  Filled 2015-06-07: qty 2

## 2015-06-07 MED ORDER — SODIUM CHLORIDE 0.9 % IV BOLUS (SEPSIS)
1000.0000 mL | Freq: Once | INTRAVENOUS | Status: AC
  Administered 2015-06-07: 1000 mL via INTRAVENOUS

## 2015-06-07 MED ORDER — ONDANSETRON HCL 4 MG PO TABS: 4.0000 mg | ORAL_TABLET | Freq: Three times a day (TID) | ORAL | Status: DC | PRN

## 2015-06-07 NOTE — Discharge Instructions (Signed)
Please drink plenty of fluids stay well-hydrated. Please take a liquid diet for the next 24-48 hours, then advance to a bland BRAT diet as tolerated. Please make a follow-up appointment with your primary care physician, and follow up with your spine surgeon as previously directed.  Return to the emergency department for worsening symptoms, inability to keep down fluids, fever, fainting, or any other symptoms concerning to you.

## 2015-06-07 NOTE — ED Notes (Signed)
Pt has back surgery one week ago.  3 days ago pt began with lightheadedness when changing positions and nausea.  Nausea and lightheadedness resolve when pt laying flat.

## 2015-06-07 NOTE — ED Provider Notes (Signed)
[facility] Emergency Department Provider Note  ____________________________________________  Time seen: Approximately 8:49 PM  I have reviewed the triage vital signs and the nursing notes.   HISTORY  Chief Complaint Nausea and lightheaded     HPI Dorothy Rogers is a 65 y.o. female status post lumbar fusion at [facility] last week presenting with cough, decreased appetite and postural lightheadedness with nausea.Patient was discharged 6 days ago and the day after discharge developed a nonproductive cough. Over the past several days she has had decreased appetite and decreased by mouth intake. She is having lightheadedness and nausea when she stands up and severe nausea with eating. She has not had any vomiting. She has been having regular bowel movements and last bowel movement was today and it was normal. No fever, chills. Possible sick contact with she was at the hospital. The only thing that makes the patient feel better is laying flat. No fever or chills, or erythema, swelling or discharge from her incision site.   Past Medical History  Diagnosis Date  . Blood transfusion without reported diagnosis   . Cataract   . Diabetes mellitus without complication ([place])   . Hyperlipidemia   . Hypertension   . Herpes simplex   . Coronary artery disease   . Anemia   . Vitamin B 12 deficiency   . 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 Arthritis     lower back, hips  . Chronic kidney disease     Stage III  . Vertigo     occasional, no episodes in 2-3 months    Patient Active Problem List   Diagnosis Date Noted  . 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 Status post lumbar spinal fusion 12/25/2014  . DDD (degenerative disc disease), lumbar 11/18/2014  . Lumbar post-laminectomy syndrome 11/18/2014  . Facet syndrome, lumbar 11/18/2014  . Piriformis syndrome of left side 11/18/2014    Past Surgical History  Procedure Laterality Date  .  Cataract extraction Left   . Coronary stent placement  03/2008  . Cardiac catheterization  2009  . Eye surgery Left     cataract extraction  . Abdominal hysterectomy    . Spine surgery      2001, 2014, 2015  . Lumbar fusion  2001, 2014, 2015  . Cystectomy Right     wrist  . Tumor excision      back of neck  . Cataract extraction w/phaco Right 03/19/2015    Procedure: CATARACT EXTRACTION PHACO AND INTRAOCULAR LENS PLACEMENT (IOC);  Surgeon: Leandrew Koyanagi, MD;  Location: [facility];  Service: Ophthalmology;  Laterality: Right;  DIABETIC - insulin and oral meds    Current Outpatient Rx  Name  Route  Sig  Dispense  Refill  . acetaminophen (TYLENOL) 325 MG tablet   Oral   Take 650 mg by mouth every 6 (six) hours as needed.         Marland Kitchen acyclovir (ZOVIRAX) 400 MG tablet      TAKE ONE TABLET BY MOUTH ONCE DAILY         . amLODipine (NORVASC) 5 MG tablet   Oral   Take by mouth. AM         . aspirin EC 81 MG tablet   Oral   Take by mouth. PM         . atorvastatin (LIPITOR) 40 MG tablet   Oral   Take by mouth. PM         .  Biotin 5000 MCG TABS   Oral   Take 5,000 mcg by mouth daily. AM         . Calcium Carbonate-Vitamin D 600-400 MG-UNIT per tablet   Oral   Take by mouth. AM         . cefUROXime (CEFTIN) 250 MG tablet   Oral   Take 1 tablet (250 mg total) by mouth 2 (two) times daily with a meal. Patient not taking: Reported on 02/04/2015   14 tablet   0   . cefUROXime (CEFTIN) 250 MG tablet   Oral   Take 1 tablet (250 mg total) by mouth 2 (two) times daily with a meal.   14 tablet   0   . clopidogrel (PLAVIX) 75 MG tablet   Oral   Take by mouth. AM         . Cyanocobalamin (RA VITAMIN B-12 TR) 1000 MCG TBCR   Oral   Take by mouth. AM         . ezetimibe (ZETIA) 10 MG tablet   Oral   Take by mouth. PM         . Ferrous Sulfate (IRON) 325 (65 FE) MG TABS   Oral   Take 65 mg by mouth daily. AM         . glipiZIDE  (GLUCOTROL) 10 MG tablet   Oral   Take 10 mg by mouth 2 (two) times daily before a meal.          . ibuprofen (ADVIL,MOTRIN) 100 MG tablet   Oral   Take 100 mg by mouth every 6 (six) hours as needed for fever.         . Insulin Glargine (LANTUS SOLOSTAR) 100 UNIT/ML Solostar Pen   Subcutaneous   Inject 36 Units into the skin daily. Unit dose varies at night due to results of blood sugar         . losartan (COZAAR) 100 MG tablet   Oral   Take by mouth daily. AM         . meclizine (ANTIVERT) 25 MG tablet   Oral   Take 25 mg by mouth 3 (three) times daily as needed for dizziness.         . ondansetron (ZOFRAN) 4 MG tablet      4 mg every 12 (twelve) hours as needed.            Allergies Beta adrenergic blockers; Celebrex; Niacin and related; Shellfish allergy; and Vicodin  Family History  Problem Relation Age of Onset  . Heart disease Father     Social History Social History  Substance Use Topics  . Smoking status: Never Smoker   . Smokeless tobacco: None  . Alcohol Use: 0.0 oz/week    0 Standard drinks or equivalent per week     Comment: 2-3 drinks per year    Review of Systems Constitutional: No fever/chills. Positive lightheadedness. Negative syncope. Eyes: No visual changes. No eye discharge. ENT: No sore throat. Cardiovascular: Denies chest pain, palpitations. Respiratory: Denies shortness of breath.  No cough. Gastrointestinal: No abdominal pain.  Positive nausea, no vomiting.  No diarrhea.  No constipation. Genitourinary: Negative for dysuria. Musculoskeletal: Positive for incision over the back with pain appropriate for postsurgical changes. Skin: Negative for rash. Neurological: Negative for headaches, focal weakness or numbness. No urinary retention or fecal incontinence. No saddle anesthesia. No weakness, tingling or numbness in the legs.  10-point ROS otherwise negative.  ____________________________________________   PHYSICAL  EXAM:  VITAL SIGNS: ED Triage Vitals  Enc Vitals Group     BP 06/07/15 1813 156/66 mmHg     Pulse Rate 06/07/15 1813 112     Resp 06/07/15 1813 20     Temp 06/07/15 1813 98.3 F (36.8 C)     Temp Source 06/07/15 1813 Oral     SpO2 06/07/15 1813 98 %     Weight 06/07/15 1813 184 lb (83.462 kg)     Height 06/07/15 1813 5\' 2"  (1.575 m)     Head Cir --      Peak Flow --      Pain Score --      Pain Loc --      Pain Edu? --      Excl. in [place]? --     Constitutional: Alert and oriented. Well appearing and comfortable appearing but in no acute distress.Marland Kitchen Answer question appropriately. Eyes: Conjunctivae are normal.  EOMI. no scleral icterus. Head: Atraumatic. Nose: No congestion/rhinnorhea. Mouth/Throat: Mucous membranes are dry.  Neck: No stridor.  Supple.  Full range of motion without pain. Cardiovascular: Normal rate, regular rhythm. No murmurs, rubs or gallops.  Respiratory: Normal respiratory effort.  No retractions. Lungs CTAB.  No wheezes, rales or ronchi. Active nonproductive cough. Gastrointestinal: Soft and nontender. No distention. No peritoneal signs. Musculoskeletal: No LE edema. Patient has a 14 inch linear incision that is closed with staples 1 cm lateral to the lumbar spine without swelling, purulence, or erythema. Neurologic:  Normal speech and language. No gross focal neurologic deficits are appreciated.  Skin:  Skin is warm, dry and intact. No rash noted. Psychiatric: Mood and affect are normal. Speech and behavior are normal.  Normal judgement.  ____________________________________________   LABS (all labs ordered are listed, but only abnormal results are displayed)  Labs Reviewed  BASIC METABOLIC PANEL - Abnormal; Notable for the following:    Glucose, Bld 111 (*)    Creatinine, Ser 1.45 (*)    Calcium 8.7 (*)    GFR calc non Af Amer 37 (*)    GFR calc Af Amer 43 (*)    All other components within normal limits  CBC - Abnormal; Notable for the following:     RBC 3.62 (*)    Hemoglobin 11.0 (*)    HCT 32.9 (*)    All other components within normal limits  URINALYSIS COMPLETEWITH MICROSCOPIC ([facility] ONLY)  HEPATIC FUNCTION PANEL   ____________________________________________  EKG  ED ECG REPORT I, Eula Listen, the attending physician, personally viewed and interpreted this ECG.   Date: 06/07/2015  EKG Time: 1818  Rate: 112  Rhythm: sinus tachycardia  Axis: Normal  Intervals:none  ST&T Change: Nonspecific T-wave inversion in V1. No ST elevation.  ____________________________________________  RADIOLOGY  No results found.  ____________________________________________   PROCEDURES  Procedure(s) performed: None  Critical Care performed: No ____________________________________________   INITIAL IMPRESSION / ASSESSMENT AND PLAN / ED COURSE  Pertinent labs & imaging results that were available during my care of the patient were reviewed by me and considered in my medical decision making (see chart for details).  64 y.o. female status post recent lumbar fusion presenting with 5 days of cough and postural lightheadedness and nausea. Clinically, the patient appears dehydrated. She has a history of CK D with a reported baseline of creatinine 1.7; her creatinine today is 1.45. We will get a chest x-ray to rule out pneumonia, and a hepatic function panel as well. I will treat her symptomatically and  reevaluate her after treatment.  ----------------------------------------- 9:23 PM on 06/07/2015 -----------------------------------------  The patient does not have pneumonia on her chest x-ray nor does she have abnormal lung sounds on my exam. It is likely that her cause is viral in etiology. Her lab work is reassuring and she has normal hepatic function panel. At this time I will focus on symptomatically treatment and anticipate discharge home.  ____________________________________________  FINAL CLINICAL IMPRESSION(S) /  ED DIAGNOSES  Final diagnoses:  None      NEW MEDICATIONS STARTED DURING THIS VISIT:  New Prescriptions   No medications on file     Eula Listen, MD 06/07/15 2128

## 2015-06-09 ENCOUNTER — Emergency Department: Payer: Medicare Other

## 2015-06-09 ENCOUNTER — Observation Stay
Admission: EM | Admit: 2015-06-09 | Discharge: 2015-06-10 | Disposition: A | Discharge status: Home / Self Care | Payer: Medicare Other | Attending: Internal Medicine | Admitting: Internal Medicine

## 2015-06-09 ENCOUNTER — Encounter: Payer: Self-pay | Admitting: *Deleted

## 2015-06-09 DIAGNOSIS — R112 Nausea with vomiting, unspecified: Secondary | ICD-10-CM | POA: Diagnosis not present

## 2015-06-09 DIAGNOSIS — N183 Chronic kidney disease, stage 3 unspecified: Secondary | ICD-10-CM | POA: Diagnosis present

## 2015-06-09 DIAGNOSIS — I129 Hypertensive chronic kidney disease with stage 1 through stage 4 chronic kidney disease, or unspecified chronic kidney disease: Secondary | ICD-10-CM | POA: Diagnosis not present

## 2015-06-09 DIAGNOSIS — I951 Orthostatic hypotension: Secondary | ICD-10-CM | POA: Insufficient documentation

## 2015-06-09 DIAGNOSIS — B009 Herpesviral infection, unspecified: Secondary | ICD-10-CM | POA: Insufficient documentation

## 2015-06-09 DIAGNOSIS — E1122 Type 2 diabetes mellitus with diabetic chronic kidney disease: Secondary | ICD-10-CM | POA: Insufficient documentation

## 2015-06-09 DIAGNOSIS — Z885 Allergy status to narcotic agent status: Secondary | ICD-10-CM | POA: Insufficient documentation

## 2015-06-09 DIAGNOSIS — Z8249 Family history of ischemic heart disease and other diseases of the circulatory system: Secondary | ICD-10-CM | POA: Insufficient documentation

## 2015-06-09 DIAGNOSIS — Z91013 Allergy to seafood: Secondary | ICD-10-CM | POA: Insufficient documentation

## 2015-06-09 DIAGNOSIS — E860 Dehydration: Secondary | ICD-10-CM | POA: Diagnosis present

## 2015-06-09 DIAGNOSIS — Z886 Allergy status to analgesic agent status: Secondary | ICD-10-CM | POA: Insufficient documentation

## 2015-06-09 DIAGNOSIS — R1110 Vomiting, unspecified: Secondary | ICD-10-CM | POA: Diagnosis present

## 2015-06-09 DIAGNOSIS — R42 Dizziness and giddiness: Secondary | ICD-10-CM | POA: Diagnosis not present

## 2015-06-09 DIAGNOSIS — M160 Bilateral primary osteoarthritis of hip: Secondary | ICD-10-CM | POA: Diagnosis not present

## 2015-06-09 DIAGNOSIS — Z794 Long term (current) use of insulin: Secondary | ICD-10-CM | POA: Diagnosis not present

## 2015-06-09 DIAGNOSIS — E1140 Type 2 diabetes mellitus with diabetic neuropathy, unspecified: Secondary | ICD-10-CM

## 2015-06-09 DIAGNOSIS — E11649 Type 2 diabetes mellitus with hypoglycemia without coma: Secondary | ICD-10-CM | POA: Insufficient documentation

## 2015-06-09 DIAGNOSIS — E162 Hypoglycemia, unspecified: Secondary | ICD-10-CM | POA: Diagnosis present

## 2015-06-09 DIAGNOSIS — R197 Diarrhea, unspecified: Secondary | ICD-10-CM | POA: Diagnosis not present

## 2015-06-09 DIAGNOSIS — M479 Spondylosis, unspecified: Secondary | ICD-10-CM | POA: Insufficient documentation

## 2015-06-09 DIAGNOSIS — Z888 Allergy status to other drugs, medicaments and biological substances status: Secondary | ICD-10-CM | POA: Diagnosis not present

## 2015-06-09 DIAGNOSIS — E785 Hyperlipidemia, unspecified: Secondary | ICD-10-CM | POA: Diagnosis not present

## 2015-06-09 DIAGNOSIS — I2510 Atherosclerotic heart disease of native coronary artery without angina pectoris: Secondary | ICD-10-CM | POA: Insufficient documentation

## 2015-06-09 DIAGNOSIS — I10 Essential (primary) hypertension: Secondary | ICD-10-CM | POA: Diagnosis present

## 2015-06-09 DIAGNOSIS — Z7982 Long term (current) use of aspirin: Secondary | ICD-10-CM | POA: Insufficient documentation

## 2015-06-09 HISTORY — DX: Nausea with vomiting, unspecified: R11.2

## 2015-06-09 LAB — COMPREHENSIVE METABOLIC PANEL
ALT: 19 U/L (ref 14–54)
AST: 24 U/L (ref 15–41)
Albumin: 3.4 g/dL — ABNORMAL LOW (ref 3.5–5.0)
Alkaline Phosphatase: 110 U/L (ref 38–126)
Anion gap: 8 (ref 5–15)
BUN: 18 mg/dL (ref 6–20)
CALCIUM: 8.7 mg/dL — AB (ref 8.9–10.3)
CO2: 23 mmol/L (ref 22–32)
CREATININE: 1.48 mg/dL — AB (ref 0.44–1.00)
Chloride: 107 mmol/L (ref 101–111)
GFR, EST AFRICAN AMERICAN: 42 mL/min — AB (ref >60)
GFR, EST NON AFRICAN AMERICAN: 36 mL/min — AB (ref >60)
GLUCOSE: 114 mg/dL — AB (ref 65–99)
POTASSIUM: 4.2 mmol/L (ref 3.5–5.1)
SODIUM: 138 mmol/L (ref 135–145)
TOTAL PROTEIN: 7.4 g/dL (ref 6.5–8.1)
Total Bilirubin: 0.8 mg/dL (ref 0.3–1.2)

## 2015-06-09 LAB — CBC
HCT: 30.5 % — ABNORMAL LOW (ref 35.0–47.0)
Hemoglobin: 10.2 g/dL — ABNORMAL LOW (ref 12.0–16.0)
MCH: 29.7 pg (ref 26.0–34.0)
MCHC: 33.4 g/dL (ref 32.0–36.0)
MCV: 89 fL (ref 80.0–100.0)
PLATELETS: 304 10*3/uL (ref 150–440)
RBC: 3.43 MIL/uL — ABNORMAL LOW (ref 3.80–5.20)
RDW: 13.1 % (ref 11.5–14.5)
WBC: 6.6 10*3/uL (ref 3.6–11.0)

## 2015-06-09 LAB — GLUCOSE, CAPILLARY
GLUCOSE-CAPILLARY: 39 mg/dL — AB (ref 65–99)
Glucose-Capillary: 123 mg/dL — ABNORMAL HIGH (ref 65–99)
Glucose-Capillary: 141 mg/dL — ABNORMAL HIGH (ref 65–99)

## 2015-06-09 LAB — LIPASE, BLOOD: LIPASE: 24 U/L (ref 11–51)

## 2015-06-09 LAB — RAPID INFLUENZA A&B ANTIGENS: Influenza A (ARMC): NOT DETECTED

## 2015-06-09 LAB — RAPID INFLUENZA A&B ANTIGENS (ARMC ONLY): INFLUENZA B (ARMC): NOT DETECTED

## 2015-06-09 LAB — TROPONIN I: Troponin I: <0.03 ng/mL (ref <0.031)

## 2015-06-09 MED ORDER — ONDANSETRON HCL 4 MG/2ML IJ SOLN
4.0000 mg | Freq: Once | INTRAMUSCULAR | Status: AC | PRN
  Administered 2015-06-09: 4 mg via INTRAVENOUS
  Filled 2015-06-09: qty 2

## 2015-06-09 MED ORDER — LOSARTAN POTASSIUM 50 MG PO TABS
100.0000 mg | ORAL_TABLET | Freq: Every day | ORAL | Status: DC
  Administered 2015-06-10: 100 mg via ORAL
  Filled 2015-06-09: qty 2

## 2015-06-09 MED ORDER — ONDANSETRON HCL 4 MG PO TABS: 4.0000 mg | ORAL_TABLET | Freq: Four times a day (QID) | ORAL | Status: DC | PRN

## 2015-06-09 MED ORDER — INSULIN ASPART 100 UNIT/ML ~~LOC~~ SOLN
0.0000 [IU] | Freq: Three times a day (TID) | SUBCUTANEOUS | Status: DC
  Administered 2015-06-10: 2 [IU] via SUBCUTANEOUS
  Filled 2015-06-09: qty 2

## 2015-06-09 MED ORDER — SODIUM CHLORIDE 0.9 % IV BOLUS (SEPSIS)
1000.0000 mL | Freq: Once | INTRAVENOUS | Status: AC
  Administered 2015-06-09: 1000 mL via INTRAVENOUS

## 2015-06-09 MED ORDER — ACETAMINOPHEN 325 MG PO TABS: 650.0000 mg | ORAL_TABLET | Freq: Four times a day (QID) | ORAL | Status: DC | PRN

## 2015-06-09 MED ORDER — ACETAMINOPHEN 650 MG RE SUPP: 650.0000 mg | Freq: Four times a day (QID) | RECTAL | Status: DC | PRN

## 2015-06-09 MED ORDER — HEPARIN SODIUM (PORCINE) 5000 UNIT/ML IJ SOLN: 5000.0000 [IU] | Freq: Three times a day (TID) | INTRAMUSCULAR | Status: DC

## 2015-06-09 MED ORDER — SODIUM CHLORIDE 0.9 % IV SOLN
INTRAVENOUS | Status: AC
  Administered 2015-06-09: via INTRAVENOUS

## 2015-06-09 MED ORDER — DEXTROSE 50 % IV SOLN
INTRAVENOUS | Status: AC
  Administered 2015-06-09: 50 mL
  Filled 2015-06-09: qty 50

## 2015-06-09 MED ORDER — ONDANSETRON HCL 4 MG/2ML IJ SOLN
4.0000 mg | Freq: Once | INTRAMUSCULAR | Status: AC
  Administered 2015-06-09: 4 mg via INTRAVENOUS

## 2015-06-09 MED ORDER — AMLODIPINE BESYLATE 5 MG PO TABS
5.0000 mg | ORAL_TABLET | Freq: Every day | ORAL | Status: DC
  Administered 2015-06-10: 5 mg via ORAL
  Filled 2015-06-09: qty 1

## 2015-06-09 MED ORDER — ACYCLOVIR 400 MG PO TABS
400.0000 mg | ORAL_TABLET | Freq: Every day | ORAL | Status: DC
  Filled 2015-06-09 (×2): qty 1

## 2015-06-09 MED ORDER — ATORVASTATIN CALCIUM 20 MG PO TABS: 40.0000 mg | ORAL_TABLET | Freq: Every day | ORAL | Status: DC

## 2015-06-09 MED ORDER — ONDANSETRON HCL 4 MG/2ML IJ SOLN: 4.0000 mg | Freq: Four times a day (QID) | INTRAMUSCULAR | Status: DC | PRN

## 2015-06-09 NOTE — Progress Notes (Signed)
Pt stating not able to tolerate heparin or Lovenox causes rash on the abdomen. Dr. Jannifer Franklin notified. md order to d/c heparin and order scd's

## 2015-06-09 NOTE — ED Notes (Signed)
Gave amp d50 to patient

## 2015-06-09 NOTE — ED Notes (Signed)
Pt attempting to eat sandwich

## 2015-06-09 NOTE — H&P (Signed)
[facility] NAME: Dorothy Rogers    MR#:  [id number]  DATE OF BIRTH:  March 01, 1951  DATE OF ADMISSION:  06/09/2015  PRIMARY CARE PHYSICIAN: Idelle Crouch, MD   REQUESTING/REFERRING PHYSICIAN: Reita Cliche, MD  CHIEF COMPLAINT:   Chief Complaint  Patient presents with  . Nausea    HISTORY OF PRESENT ILLNESS:  Dorothy Rogers  is a 65 y.o. female who presents with intractable nausea and vomiting for the past 24-48 hours. Patient states that she was recently around some people who she felt had some kind of stomach illness. She is unsure if she contracted this as well, however she developed nausea and vomiting with diarrhea which has persisted and progressed to the point that she is not able to keep down anything by mouth, including her medications. Evaluation here in terms of lab workup and imaging is largely benign. However, the patient has orthostatic hypotension despite fluid resuscitation in the ED, as well as persistent nausea despite multiple doses of antiemetics. Hospitals were called for admission for the same.  PAST MEDICAL HISTORY:   Past Medical History  Diagnosis Date  . Blood transfusion without reported diagnosis   . Cataract   . Diabetes mellitus without complication ([place])   . Hyperlipidemia   . Hypertension   . Herpes simplex   . Coronary artery disease   . Anemia   . Vitamin B 12 deficiency   . Cancer ([place])     tumor back of neck-fibroushistocytoma  . Arthritis     lower back, hips  . Chronic kidney disease     Stage III  . Vertigo     occasional, no episodes in 2-3 months    PAST SURGICAL HISTORY:   Past Surgical History  Procedure Laterality Date  . Cataract extraction Left   . Coronary stent placement  03/2008  . Cardiac catheterization  2009  . Eye surgery Left     cataract extraction  . Abdominal hysterectomy    . Spine surgery      2001, 2014, 2015  . Lumbar fusion  2001, 2014, 2015  .  Cystectomy Right     wrist  . Tumor excision      back of neck  . Cataract extraction w/phaco Right 03/19/2015    Procedure: CATARACT EXTRACTION PHACO AND INTRAOCULAR LENS PLACEMENT (IOC);  Surgeon: Leandrew Koyanagi, MD;  Location: [facility];  Service: Ophthalmology;  Laterality: Right;  DIABETIC - insulin and oral meds  . Back surgery      Lumbar spinal fusion 05/30/2015    SOCIAL HISTORY:   Social History  Substance Use Topics  . Smoking status: Never Smoker   . Smokeless tobacco: Not on file  . Alcohol Use: 0.0 oz/week    0 Standard drinks or equivalent per week     Comment: 2-3 drinks per year    FAMILY HISTORY:   Family History  Problem Relation Age of Onset  . Heart disease Father     DRUG ALLERGIES:   Allergies  Allergen Reactions  . Beta Adrenergic Blockers Hives  . Celebrex [Celecoxib] Other (See Comments)    A lot of pressure in head  . Niacin And Related Other (See Comments)    Flushing and hot sensation  . Shellfish Allergy Nausea And Vomiting  . Vicodin [Hydrocodone-Acetaminophen] Other (See Comments)    Hypoglycemic episodes    MEDICATIONS AT HOME:   Prior to Admission medications   Medication Sig Start  Date End Date Taking? Authorizing Provider  acetaminophen (TYLENOL) 325 MG tablet Take 650 mg by mouth every 6 (six) hours as needed.    Historical Provider, MD  acyclovir (ZOVIRAX) 400 MG tablet TAKE ONE TABLET BY MOUTH ONCE DAILY 09/20/14   Historical Provider, MD  amLODipine (NORVASC) 5 MG tablet Take by mouth. AM 08/29/14   Historical Provider, MD  aspirin EC 81 MG tablet Take by mouth. PM    Historical Provider, MD  atorvastatin (LIPITOR) 40 MG tablet Take by mouth. PM 08/16/14   Historical Provider, MD  Biotin 5000 MCG TABS Take 5,000 mcg by mouth daily. AM    Historical Provider, MD  Calcium Carbonate-Vitamin D 600-400 MG-UNIT per tablet Take by mouth. AM    Historical Provider, MD  cefUROXime (CEFTIN) 250 MG tablet Take 1 tablet (250  mg total) by mouth 2 (two) times daily with a meal. Patient not taking: Reported on 02/04/2015 01/01/15   Mohammed Kindle, MD  cefUROXime (CEFTIN) 250 MG tablet Take 1 tablet (250 mg total) by mouth 2 (two) times daily with a meal. 02/10/15   Mohammed Kindle, MD  clopidogrel (PLAVIX) 75 MG tablet Take by mouth. AM 01/21/14   Historical Provider, MD  Cyanocobalamin (RA VITAMIN B-12 TR) 1000 MCG TBCR Take by mouth. AM    Historical Provider, MD  ezetimibe (ZETIA) 10 MG tablet Take by mouth. PM 11/26/13   Historical Provider, MD  Ferrous Sulfate (IRON) 325 (65 FE) MG TABS Take 65 mg by mouth daily. AM    Historical Provider, MD  glipiZIDE (GLUCOTROL) 10 MG tablet Take 10 mg by mouth 2 (two) times daily before a meal.  06/24/14   Historical Provider, MD  ibuprofen (ADVIL,MOTRIN) 100 MG tablet Take 100 mg by mouth every 6 (six) hours as needed for fever.    Historical Provider, MD  Insulin Glargine (LANTUS SOLOSTAR) 100 UNIT/ML Solostar Pen Inject 36 Units into the skin daily. Unit dose varies at night due to results of blood sugar 07/25/14   Historical Provider, MD  losartan (COZAAR) 100 MG tablet Take by mouth daily. AM 02/05/14   Historical Provider, MD  meclizine (ANTIVERT) 25 MG tablet Take 25 mg by mouth 3 (three) times daily as needed for dizziness.    Historical Provider, MD  ondansetron (ZOFRAN) 4 MG tablet Take 1 tablet (4 mg total) by mouth every 8 (eight) hours as needed for nausea or vomiting. 06/07/15   Eula Listen, MD    REVIEW OF SYSTEMS:  Review of Systems  Constitutional: Negative for fever, chills, weight loss and malaise/fatigue.  HENT: Negative for ear pain, hearing loss and tinnitus.   Eyes: Negative for blurred vision, double vision, pain and redness.  Respiratory: Negative for cough, hemoptysis and shortness of breath.   Cardiovascular: Negative for chest pain, palpitations, orthopnea and leg swelling.  Gastrointestinal: Positive for nausea, vomiting and diarrhea. Negative for  abdominal pain and constipation.  Genitourinary: Negative for dysuria, frequency and hematuria.  Musculoskeletal: Negative for back pain, joint pain and neck pain.  Skin:       No acne, rash, or lesions  Neurological: Negative for dizziness, tremors, focal weakness and weakness.  Endo/Heme/Allergies: Negative for polydipsia. Does not bruise/bleed easily.  Psychiatric/Behavioral: Negative for depression. The patient is not nervous/anxious and does not have insomnia.      VITAL SIGNS:   Filed Vitals:   06/09/15 1700 06/09/15 1730 06/09/15 1800 06/09/15 2000  BP: 125/57 131/62 131/57 120/67  Pulse: 75 82 85 79  Temp:      TempSrc:      Resp: 14 13 14    Height:      Weight:      SpO2: 100% 100% 100% 100%   Wt Readings from Last 3 Encounters:  06/09/15 81.194 kg (179 lb)  06/07/15 83.462 kg (184 lb)  03/19/15 87.091 kg (192 lb)    PHYSICAL EXAMINATION:  Physical Exam  Vitals reviewed. Constitutional: She is oriented to person, place, and time. She appears well-developed and well-nourished. No distress.  HENT:  Head: Normocephalic and atraumatic.  Mouth/Throat: Oropharynx is clear and moist.  Eyes: Conjunctivae and EOM are normal. Pupils are equal, round, and reactive to light. No scleral icterus.  Neck: Normal range of motion. Neck supple. No JVD present. No thyromegaly present.  Cardiovascular: Normal rate, regular rhythm and intact distal pulses.  Exam reveals no gallop and no friction rub.   No murmur heard. Respiratory: Effort normal and breath sounds normal. No respiratory distress. She has no wheezes. She has no rales.  GI: Soft. Bowel sounds are normal. She exhibits no distension. There is no tenderness.  Musculoskeletal: Normal range of motion. She exhibits no edema.  No arthritis, no gout  Lymphadenopathy:    She has no cervical adenopathy.  Neurological: She is alert and oriented to person, place, and time. No cranial nerve deficit.  No dysarthria, no aphasia   Skin: Skin is warm and dry. No rash noted. No erythema.  Psychiatric: She has a normal mood and affect. Her behavior is normal. Judgment and thought content normal.    LABORATORY PANEL:   CBC  Recent Labs Lab 06/09/15 1416  WBC 6.6  HGB 10.2*  HCT 30.5*  PLT 304   ------------------------------------------------------------------------------------------------------------------  Chemistries   Recent Labs Lab 06/09/15 1416  NA 138  K 4.2  CL 107  CO2 23  GLUCOSE 114*  BUN 18  CREATININE 1.48*  CALCIUM 8.7*  AST 24  ALT 19  ALKPHOS 110  BILITOT 0.8   ------------------------------------------------------------------------------------------------------------------  Cardiac Enzymes  Recent Labs Lab 06/09/15 1416  TROPONINI <0.03   ------------------------------------------------------------------------------------------------------------------  RADIOLOGY:  Ct Head Wo Contrast  06/09/2015  CLINICAL DATA:  Dizziness and nausea. Lightheadedness. Symptoms for 5 days. EXAM: CT HEAD WITHOUT CONTRAST TECHNIQUE: Contiguous axial images were obtained from the base of the skull through the vertex without intravenous contrast. COMPARISON:  Head CT 10/19/2011 FINDINGS: No intracranial hemorrhage, mass effect, or midline shift. No hydrocephalus. The basilar cisterns are patent. No evidence of territorial infarct. No intracranial fluid collection. Atherosclerosis noted of skullbase vasculature. Calvarium is intact. Included paranasal sinuses and mastoid air cells are well aerated. IMPRESSION: No acute intracranial abnormality. Electronically Signed   By: Jeb Levering M.D.   On: 06/09/2015 19:50    EKG:   Orders placed or performed during the hospital encounter of 06/09/15  . ED EKG  . ED EKG  . EKG 12-Lead  . EKG 12-Lead  . EKG 12-Lead  . EKG 12-Lead    IMPRESSION AND PLAN:  Principal Problem:   Intractable nausea and vomiting - continue when necessary antiemetics.  We will have the patient to eat as she feels she is able. Active Problems:   Dehydration - fluid rehydration tonight. Patient's lab work is not that bad. Renal function is at her chronic baseline level. Follow her orthostatics in the morning.   Type 2 diabetes mellitus (HCC) - patient's blood sugar dropped to the 30s to 90s ED. This despite the fact she was unable  to take her anti-glycemic medications today. We'll have her on hypoglycemic protocol here in conjunction with her sliding scale and fingerstick glucose checks. Heart healthy/carb modified diet.   HTN (hypertension) - normotensive here, though this is likely low for her. We will hold her antihypertensives tonight, scheduled to restart in the morning with the thought that her blood pressure will likely, by then with her fluid rehydration.   CAD (coronary artery disease) - continue home meds   HLD (hyperlipidemia) - continue home meds   CKD (chronic kidney disease), stage III - currently at her baseline, will monitor and avoid nephrotoxins.  All the records are reviewed and case discussed with ED provider. Management plans discussed with the patient and/or family.  DVT PROPHYLAXIS: SubQ heparin  GI PROPHYLAXIS: None  ADMISSION STATUS: Observation  CODE STATUS: Full  TOTAL TIME TAKING CARE OF THIS PATIENT: 35 minutes.    Won Kreuzer [place] 06/09/2015, 9:58 PM  [facility]  Office  [phone]  CC: Primary care physician; Idelle Crouch, MD

## 2015-06-09 NOTE — ED Notes (Signed)
Patient c/o nausea and light-headness since 1/4. Patient was seen in ED  Two days ago for same complaint. Patient had back surgery on 05/30/2015.

## 2015-06-09 NOTE — ED Provider Notes (Signed)
[facility] Emergency Department Provider Note   ____________________________________________  Time seen: Approximately 5:30 PM I have reviewed the triage vital signs and the triage nursing note.  HISTORY  Chief Complaint Nausea   Historian Patient, spouse and son  HPI Dorothy Rogers is a 65 y.o. female is here for evaluation of lightheadedness when she stands up with a feeling of nausea. She denies room spinning and states this feels different than vertigo which she has had in the past. She had a lumbar fusion at [facility] last week and has been home for approximately 6 days. In that time she has developed mild upper respiratory symptoms with some sinus congestion. She's also had diarrhea yesterday. She does have a history of black stools, and is on iron pills. Denies fever. She was seen in the emergency department 2 days ago with same symptoms, and was discharged home. Family states she was given about a fourth of a liter of fluid before being sent home. They're concerned that she is still dehydrated.    Past Medical History  Diagnosis Date  . Blood transfusion without reported diagnosis   . Cataract   . Diabetes mellitus without complication ([place])   . Hyperlipidemia   . Hypertension   . Herpes simplex   . Coronary artery disease   . Anemia   . Vitamin B 12 deficiency   . Cancer ([place])     tumor back of neck-fibroushistocytoma  . Arthritis     lower back, hips  . Chronic kidney disease     Stage III  . Vertigo     occasional, no episodes in 2-3 months    Patient Active Problem List   Diagnosis Date Noted  . 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 Status post lumbar spinal fusion 12/25/2014  . DDD (degenerative disc disease), lumbar 11/18/2014  . Lumbar post-laminectomy syndrome 11/18/2014  . Facet syndrome, lumbar 11/18/2014  . Piriformis syndrome of left side 11/18/2014    Past Surgical History  Procedure Laterality Date  . Cataract  extraction Left   . Coronary stent placement  03/2008  . Cardiac catheterization  2009  . Eye surgery Left     cataract extraction  . Abdominal hysterectomy    . Spine surgery      2001, 2014, 2015  . Lumbar fusion  2001, 2014, 2015  . Cystectomy Right     wrist  . Tumor excision      back of neck  . Cataract extraction w/phaco Right 03/19/2015    Procedure: CATARACT EXTRACTION PHACO AND INTRAOCULAR LENS PLACEMENT (IOC);  Surgeon: Leandrew Koyanagi, MD;  Location: [facility];  Service: Ophthalmology;  Laterality: Right;  DIABETIC - insulin and oral meds  . Back surgery      Lumbar spinal fusion 05/30/2015    Current Outpatient Rx  Name  Route  Sig  Dispense  Refill  . acetaminophen (TYLENOL) 325 MG tablet   Oral   Take 650 mg by mouth every 6 (six) hours as needed.         Marland Kitchen acyclovir (ZOVIRAX) 400 MG tablet      TAKE ONE TABLET BY MOUTH ONCE DAILY         . amLODipine (NORVASC) 5 MG tablet   Oral   Take by mouth. AM         . aspirin EC 81 MG tablet   Oral   Take by mouth. PM         . atorvastatin (  LIPITOR) 40 MG tablet   Oral   Take by mouth. PM         . Biotin 5000 MCG TABS   Oral   Take 5,000 mcg by mouth daily. AM         . Calcium Carbonate-Vitamin D 600-400 MG-UNIT per tablet   Oral   Take by mouth. AM         . cefUROXime (CEFTIN) 250 MG tablet   Oral   Take 1 tablet (250 mg total) by mouth 2 (two) times daily with a meal. Patient not taking: Reported on 02/04/2015   14 tablet   0   . cefUROXime (CEFTIN) 250 MG tablet   Oral   Take 1 tablet (250 mg total) by mouth 2 (two) times daily with a meal.   14 tablet   0   . clopidogrel (PLAVIX) 75 MG tablet   Oral   Take by mouth. AM         . Cyanocobalamin (RA VITAMIN B-12 TR) 1000 MCG TBCR   Oral   Take by mouth. AM         . ezetimibe (ZETIA) 10 MG tablet   Oral   Take by mouth. PM         . Ferrous Sulfate (IRON) 325 (65 FE) MG TABS   Oral   Take 65 mg by  mouth daily. AM         . glipiZIDE (GLUCOTROL) 10 MG tablet   Oral   Take 10 mg by mouth 2 (two) times daily before a meal.          . ibuprofen (ADVIL,MOTRIN) 100 MG tablet   Oral   Take 100 mg by mouth every 6 (six) hours as needed for fever.         . Insulin Glargine (LANTUS SOLOSTAR) 100 UNIT/ML Solostar Pen   Subcutaneous   Inject 36 Units into the skin daily. Unit dose varies at night due to results of blood sugar         . losartan (COZAAR) 100 MG tablet   Oral   Take by mouth daily. AM         . meclizine (ANTIVERT) 25 MG tablet   Oral   Take 25 mg by mouth 3 (three) times daily as needed for dizziness.         . ondansetron (ZOFRAN) 4 MG tablet   Oral   Take 1 tablet (4 mg total) by mouth every 8 (eight) hours as needed for nausea or vomiting.   20 tablet   0     Allergies Beta adrenergic blockers; Celebrex; Niacin and related; Shellfish allergy; and Vicodin  Family History  Problem Relation Age of Onset  . Heart disease Father     Social History Social History  Substance Use Topics  . Smoking status: Never Smoker   . Smokeless tobacco: None  . Alcohol Use: 0.0 oz/week    0 Standard drinks or equivalent per week     Comment: 2-3 drinks per year    Review of Systems  Constitutional: Negative for fever. Eyes: Negative for visual changes. ENT: Negative for sore throat. Positive for nasal congestion. Cardiovascular: Negative for chest pain. Respiratory: Negative for shortness of breath. Gastrointestinal: Negative for abdominal pain, or vomiting. Genitourinary: Negative for dysuria. Musculoskeletal: Negative for back pain. Skin: Negative for rash. Neurological: Negative for headache. 10 point Review of Systems otherwise negative ____________________________________________   PHYSICAL EXAM:  VITAL SIGNS: ED  Triage Vitals  Enc Vitals Group     BP 06/09/15 1403 106/86 mmHg     Pulse Rate 06/09/15 1403 99     Resp 06/09/15 1403 18      Temp 06/09/15 1403 98 F (36.7 C)     Temp Source 06/09/15 1403 Oral     SpO2 06/09/15 1403 100 %     Weight 06/09/15 1403 179 lb (81.194 kg)     Height 06/09/15 1403 5\' 2"  (1.575 m)     Head Cir --      Peak Flow --      Pain Score 06/09/15 1406 4     Pain Loc --      Pain Edu? --      Excl. in [place]? --      Constitutional: Alert and oriented. Well appearing and in no distress. Eyes: Conjunctivae are normal. PERRL. Normal extraocular movements. ENT   Head: Normocephalic and atraumatic.   Nose: Mild nasal congestion.   Mouth/Throat: Mucous membranes are mildly dry.   Neck: No stridor. Cardiovascular/Chest: Normal rate, regular rhythm.  No murmurs, rubs, or gallops. Respiratory: Normal respiratory effort without tachypnea nor retractions. Breath sounds are clear and equal bilaterally. No wheezes/rales/rhonchi. Gastrointestinal: Soft. No distention, no guarding, no rebound. Nontender.  Genitourinary/rectal: Black stool, heme negative. Small external hemorrhoid. Nontender rectal exam. Musculoskeletal: Nontender with normal range of motion in all extremities. No joint effusions.  No lower extremity tenderness.  No edema. Neurologic:  Normal speech and language. No gross or focal neurologic deficits are appreciated. Skin:  Skin is warm, dry and intact. No rash noted. Psychiatric: Mood and affect are normal. Speech and behavior are normal. Patient exhibits appropriate insight and judgment.  ____________________________________________   EKG I, Lisa Roca, MD, the attending physician have personally viewed and interpreted all ECGs.  87 bpm. Normal sinus rhythm. Narrow QRS. Normal axis. Nonspecific T-wave ____________________________________________  LABS (pertinent positives/negatives)  Lipase 24 Compressive metabolic panel significant for creatinine 1.48 White blood count 6.6, hemoglobin 10.2 and platelet count 304 Troponin  Pending Glucose  39  ____________________________________________  RADIOLOGY All Xrays were viewed by me. Imaging interpreted by Radiologist.  None __________________________________________  PROCEDURES  Procedure(s) performed: None  Critical Care performed: None  ____________________________________________   ED COURSE / ASSESSMENT AND PLAN  CONSULTATIONS: None  Pertinent labs & imaging results that were available during my care of the patient were reviewed by me and considered in my medical decision making (see chart for details).   Patient is here for symptoms that sound like dehydration/orthostatic symptoms. No focal neurologic symptoms to make me concerned about intracranial emergency. She is reporting some nausea when she stands up, which does not sound likely to be ACS, and her EKG and troponin are reassuring.  In terms of dehydration, her creatinine is actually adequate or better than baseline today. She is given 2 L normal saline.  Patient remains extremely nauseated, and dizzy with standing. After 2 L normal saline her orthostatics were positive for tachycardia going from her resting heart rate of 70 to 108.  She did have an episode of dizziness with standing with some confusion and was found to have low blood sugar in the 30s. Her last episode was yesterday morning, and her last Lantus dose was yesterday morning. It's been over 30 hours. Patient was given D50 and her blood glucose did come up in her mental status improved.  I discussed with them obtaining a head CT given the persistent dizziness and nausea  despite fluids and treatment of the hypoglycemia.  Head CT is negative.  Given the persistent nausea which is I think part of a viral syndrome, as well as the continued orthostatic tachycardia after 2 L normal saline bolus, patient will be admitted overnight for observation and treatment of dehydration and intractable nausea.  Third liter normal saline was initiated.  Patient  / Family / Caregiver informed of clinical course, medical decision-making process, and agree with plan.   I discussed return precautions, follow-up instructions, and discharged instructions with patient and/or family.  ___________________________________________   FINAL CLINICAL IMPRESSION(S) / ED DIAGNOSES   Final diagnoses:  Intractable vomiting with nausea, vomiting of unspecified type  Dehydration  Hypoglycemia              Note: This dictation was prepared with Dragon dictation. Any transcriptional errors that result from this process are unintentional   Lisa Roca, MD 06/09/15 2101

## 2015-06-10 DIAGNOSIS — R112 Nausea with vomiting, unspecified: Secondary | ICD-10-CM | POA: Diagnosis not present

## 2015-06-10 LAB — C DIFFICILE QUICK SCREEN W PCR REFLEX
C Diff antigen: POSITIVE — AB
C Diff toxin: NEGATIVE

## 2015-06-10 LAB — BASIC METABOLIC PANEL
Anion gap: 8 (ref 5–15)
BUN: 14 mg/dL (ref 6–20)
CALCIUM: 7.9 mg/dL — AB (ref 8.9–10.3)
CO2: 22 mmol/L (ref 22–32)
CREATININE: 1.34 mg/dL — AB (ref 0.44–1.00)
Chloride: 113 mmol/L — ABNORMAL HIGH (ref 101–111)
GFR calc Af Amer: 47 mL/min — ABNORMAL LOW (ref >60)
GFR, EST NON AFRICAN AMERICAN: 41 mL/min — AB (ref >60)
GLUCOSE: 101 mg/dL — AB (ref 65–99)
Potassium: 3.8 mmol/L (ref 3.5–5.1)
Sodium: 143 mmol/L (ref 135–145)

## 2015-06-10 LAB — GLUCOSE, CAPILLARY
GLUCOSE-CAPILLARY: 86 mg/dL (ref 65–99)
GLUCOSE-CAPILLARY: 218 mg/dL — AB (ref 65–99)
Glucose-Capillary: 179 mg/dL — ABNORMAL HIGH (ref 65–99)

## 2015-06-10 LAB — CBC
HCT: 26.8 % — ABNORMAL LOW (ref 35.0–47.0)
HEMOGLOBIN: 8.8 g/dL — AB (ref 12.0–16.0)
MCH: 29.4 pg (ref 26.0–34.0)
MCHC: 32.8 g/dL (ref 32.0–36.0)
MCV: 89.8 fL (ref 80.0–100.0)
PLATELETS: 236 10*3/uL (ref 150–440)
RBC: 2.99 MIL/uL — ABNORMAL LOW (ref 3.80–5.20)
RDW: 13.3 % (ref 11.5–14.5)
WBC: 6.1 10*3/uL (ref 3.6–11.0)

## 2015-06-10 LAB — CLOSTRIDIUM DIFFICILE BY PCR: CDIFFPCR: POSITIVE — AB

## 2015-06-10 LAB — HEMOGLOBIN A1C: HEMOGLOBIN A1C: 6.5 % — AB (ref 4.0–6.0)

## 2015-06-10 MED ORDER — GLUCERNA SHAKE PO LIQD
237.0000 mL | Freq: Two times a day (BID) | ORAL | Status: DC
  Administered 2015-06-10 (×2): 237 mL via ORAL

## 2015-06-10 MED ORDER — METRONIDAZOLE 500 MG PO TABS
500.0000 mg | ORAL_TABLET | Freq: Three times a day (TID) | ORAL | Status: DC
Start: 2015-06-10 — End: 2015-07-10

## 2015-06-10 MED ORDER — INSULIN GLARGINE 100 UNIT/ML SOLOSTAR PEN: 14.0000 [IU] | PEN_INJECTOR | Freq: Every day | SUBCUTANEOUS | Status: DC

## 2015-06-10 MED ORDER — METRONIDAZOLE 500 MG PO TABS
500.0000 mg | ORAL_TABLET | Freq: Once | ORAL | Status: AC
  Administered 2015-06-10: 500 mg via ORAL

## 2015-06-10 MED ORDER — ACYCLOVIR 200 MG PO CAPS
400.0000 mg | ORAL_CAPSULE | Freq: Every day | ORAL | Status: DC
  Administered 2015-06-10: 400 mg via ORAL
  Filled 2015-06-10: qty 2

## 2015-06-10 MED ORDER — GLUCERNA PO LIQD: 1.0000 | Freq: Two times a day (BID) | ORAL | Status: DC

## 2015-06-10 NOTE — Progress Notes (Signed)
Pt stated she began feeling nausea after being discharged from hospital. Recent back surgery at [facility]. Pt stated her nurse on Sunday was complaining of feeling sick. Pt's symptoms began Monday.

## 2015-06-10 NOTE — Plan of Care (Signed)
Problem: Pain Managment: Goal: General experience of comfort will improve Outcome: Completed/Met Date Met:  06/10/15 No complaints of pain this shift.  Problem: Skin Integrity: Goal: Risk for impaired skin integrity will decrease Outcome: Progressing Pt with an incision from a back surgery 10 days ago is dry and intact.  Problem: Fluid Volume: Goal: Ability to maintain a balanced intake and output will improve Outcome: Completed/Met Date Met:  06/10/15 Pt has not had any episodes of emesis this shift.

## 2015-06-10 NOTE — Discharge Summary (Addendum)
Dorothy Rogers    MR#:  [id number]  DATE OF BIRTH:  1951/04/11  DATE OF ADMISSION:  06/09/2015 ADMITTING PHYSICIAN: Lance Coon, MD  DATE OF DISCHARGE: 06/10/2015  PRIMARY CARE PHYSICIAN: SPARKS,JEFFREY D, MD    ADMISSION DIAGNOSIS:  Dehydration [E86.0] Hypoglycemia [E16.2] Intractable vomiting with nausea, vomiting of unspecified type [R11.10]  DISCHARGE DIAGNOSIS:  Principal Problem:   Intractable nausea and vomiting Active Problems:   Dehydration   Type 2 diabetes mellitus (HCC)   HLD (hyperlipidemia)   HTN (hypertension)   CAD (coronary artery disease)   CKD (chronic kidney disease), stage III   SECONDARY DIAGNOSIS:   Past Medical History  Diagnosis Date  . Blood transfusion without reported diagnosis   . Cataract   . Diabetes mellitus without complication ([place])   . Hyperlipidemia   . Hypertension   . Herpes simplex   . Coronary artery disease   . Anemia   . Vitamin B 12 deficiency   . Cancer ([place])     tumor back of neck-fibroushistocytoma  . Arthritis     lower back, hips  . Chronic kidney disease     Stage III  . Vertigo     occasional, no episodes in 2-3 months    HOSPITAL COURSE:   65 year old female with a history of coronary artery disease and recent back surgery who presents with nausea vomiting and diarrhea. For further details please further H&P.  1. Intractable nausea with vomiting and diarrhea: Patient symptoms have improved. Patient is does not have an appetite and does not like the taste of her food since her surgery. She is tolerating her diet without nausea or vomiting.  2. Type 2 diabetes: Due to the fact the patient is not taking a good diet I have discontinued her oral medications and decrease insulin dose. She will continue to monitor blood sugars every before meals and daily at bedtime.  3. Essential hypertension: Patient will resume her outpatient  medications.  4. CAD: Continue home medications.   5. C diff: Pateint was started on FLAGYL. Her symptoms could be related to this.She needs a 14 day course.   DISCHARGE CONDITIONS AND DIET:   Stable for discharge on a heart healthy diet  CONSULTS OBTAINED:     DRUG ALLERGIES:   Allergies  Allergen Reactions  . Beta Adrenergic Blockers Hives  . Celebrex [Celecoxib] Other (See Comments)    A lot of pressure in head  . Niacin And Related Other (See Comments)    Flushing and hot sensation  . Shellfish Allergy Nausea And Vomiting  . Vicodin [Hydrocodone-Acetaminophen] Other (See Comments)    Hypoglycemic episodes    DISCHARGE MEDICATIONS:   Discharge Medication List as of 06/10/2015  4:56 PM    START taking these medications   Details  GLUCERNA (GLUCERNA) LIQD Take 1 Can by mouth 2 (two) times daily between meals., Starting 06/10/2015, Until Discontinued, Normal    metroNIDAZOLE (FLAGYL) 500 MG tablet Take 1 tablet (500 mg total) by mouth 3 (three) times daily., Starting 06/10/2015, Until Discontinued, Print      CONTINUE these medications which have CHANGED   Details  Insulin Glargine (LANTUS SOLOSTAR) 100 UNIT/ML Solostar Pen Inject 14 Units into the skin daily. Inject 14 units until you are able to tolerate normal diet. Continue to, Starting 06/10/2015, Until Discontinued, Normal      CONTINUE these medications which have NOT CHANGED   Details  acetaminophen (TYLENOL)  325 MG tablet Take 650 mg by mouth every 6 (six) hours as needed for moderate pain, fever or headache. , Until Discontinued, Historical Med    acetaminophen-codeine (TYLENOL #3) 300-30 MG tablet Take 1 tablet by mouth every 4 (four) hours as needed for moderate pain., Until Discontinued, Historical Med    acyclovir (ZOVIRAX) 400 MG tablet Take 400 mg by mouth 2 (two) times daily as needed (fever blisters)., Until Discontinued, Historical Med    amLODipine (NORVASC) 5 MG tablet Take 5 mg by mouth daily.  AM, Starting 08/29/2014, Until Discontinued, Historical Med    atorvastatin (LIPITOR) 40 MG tablet Take 40 mg by mouth at bedtime. , Starting 08/16/2014, Until Discontinued, Historical Med    Biotin 5000 MCG TABS Take 5,000 mcg by mouth daily. , Until Discontinued, Historical Med    Calcium Carbonate-Vitamin D 600-400 MG-UNIT per tablet Take 1 tablet by mouth daily. , Until Discontinued, Historical Med    clopidogrel (PLAVIX) 75 MG tablet Take 75 mg by mouth daily. Medication currently on hold until stitches are removed, Starting 01/21/2014, Until Discontinued, Historical Med    Cyanocobalamin (RA VITAMIN B-12 TR) 1000 MCG TBCR Take 1 tablet by mouth daily. , Until Discontinued, Historical Med    ezetimibe (ZETIA) 10 MG tablet Take 10 mg by mouth at bedtime. , Starting 11/26/2013, Until Discontinued, Historical Med    Ferrous Sulfate (IRON) 325 (65 FE) MG TABS Take 1 tablet by mouth daily. , Until Discontinued, Historical Med    losartan (COZAAR) 100 MG tablet Take 100 mg by mouth daily. , Starting 02/05/2014, Until Discontinued, Historical Med    meclizine (ANTIVERT) 25 MG tablet Take 25 mg by mouth 3 (three) times daily as needed for dizziness., Until Discontinued, Historical Med    ondansetron (ZOFRAN) 4 MG tablet Take 1 tablet (4 mg total) by mouth every 8 (eight) hours as needed for nausea or vomiting., Starting 06/07/2015, Until Discontinued, Print      STOP taking these medications     glipiZIDE (GLUCOTROL) 10 MG tablet               Today   CHIEF COMPLAINT:  Patient has decreased appetite in the food does not taste good to hurt. She denies abdominal pain or nausea or vomiting. Diarrhea has improved.   VITAL SIGNS:  Blood pressure 122/58, pulse 83, temperature 98.9 F (37.2 C), temperature source Oral, resp. rate 18, height 5\' 2"  (1.575 m), weight 84.823 kg (187 lb), SpO2 98 %.   REVIEW OF SYSTEMS:  Review of Systems  Constitutional: Negative for fever, chills and  malaise/fatigue.       Poor appetite  HENT: Negative for sore throat.   Eyes: Negative for blurred vision.  Respiratory: Negative for cough, hemoptysis, shortness of breath and wheezing.   Cardiovascular: Negative for chest pain, palpitations and leg swelling.  Gastrointestinal: Positive for diarrhea. Negative for nausea, vomiting, abdominal pain and blood in stool.  Genitourinary: Negative for dysuria.  Musculoskeletal: Negative for back pain.  Neurological: Negative for dizziness, tremors and headaches.  Endo/Heme/Allergies: Does not bruise/bleed easily.     PHYSICAL EXAMINATION:  GENERAL:  65 y.o.-year-old patient lying in the bed with no acute distress.  NECK:  Supple, no jugular venous distention. No thyroid enlargement, no tenderness.  LUNGS: Normal breath sounds bilaterally, no wheezing, rales,rhonchi  No use of accessory muscles of respiration.  CARDIOVASCULAR: S1, S2 normal. No murmurs, rubs, or gallops.  ABDOMEN: Soft, non-tender, non-distended. Bowel sounds present. No organomegaly or mass.  EXTREMITIES: No pedal edema, cyanosis, or clubbing.  PSYCHIATRIC: The patient is alert and oriented x 3.  SKIN: No obvious rash, lesion, or ulcer.   DATA REVIEW:   CBC  Recent Labs Lab 06/10/15 0447  WBC 6.1  HGB 8.8*  HCT 26.8*  PLT 236    Chemistries   Recent Labs Lab 06/09/15 1416 06/10/15 0447  NA 138 143  K 4.2 3.8  CL 107 113*  CO2 23 22  GLUCOSE 114* 101*  BUN 18 14  CREATININE 1.48* 1.34*  CALCIUM 8.7* 7.9*  AST 24  --   ALT 19  --   ALKPHOS 110  --   BILITOT 0.8  --     Cardiac Enzymes  Recent Labs Lab 06/09/15 1416  TROPONINI <0.03    Microbiology Results  @MICRORSLT48 @  RADIOLOGY:  Ct Head Wo Contrast  06/09/2015  CLINICAL DATA:  Dizziness and nausea. Lightheadedness. Symptoms for 5 days. EXAM: CT HEAD WITHOUT CONTRAST TECHNIQUE: Contiguous axial images were obtained from the base of the skull through the vertex without intravenous  contrast. COMPARISON:  Head CT 10/19/2011 FINDINGS: No intracranial hemorrhage, mass effect, or midline shift. No hydrocephalus. The basilar cisterns are patent. No evidence of territorial infarct. No intracranial fluid collection. Atherosclerosis noted of skullbase vasculature. Calvarium is intact. Included paranasal sinuses and mastoid air cells are well aerated. IMPRESSION: No acute intracranial abnormality. Electronically Signed   By: Jeb Levering M.D.   On: 06/09/2015 19:50      Management plans discussed with the patient and she is in agreement. Stable for discharge   Patient should follow up with PCP in one week  CODE STATUS:     Code Status Orders        Start     Ordered   06/09/15 2257  Full code   Continuous     06/09/15 2256    Code Status History    Date Active Date Inactive Code Status Order ID Comments User Context   This patient has a current code status but no historical code status.    Advance Directive Documentation        Most Recent Value   Type of Advance Directive  Healthcare Power of Attorney, Living will   Pre-existing out of facility DNR order (yellow form or pink MOST form)     "MOST" Form in Place?        TOTAL TIME TAKING CARE OF THIS PATIENT: 35 minutes.    Note: This dictation was prepared with Dragon dictation along with smaller phrase technology. Any transcriptional errors that result from this process are unintentional.  Sareen Randon M.D on 06/11/2015 at 7:39 AM  Between 7am to 6pm - Pager - [phone] After 6pm go to [email or web address] - password [email or web address] [facility]  Office  [phone]  CC: Primary care physician; Idelle Crouch, MD

## 2015-06-10 NOTE — Progress Notes (Signed)
Pt discharged to home this evening. Husband at bedside witll transport by car. Pt to f/u with PCP next week.

## 2015-06-15 ENCOUNTER — Encounter: Payer: Self-pay | Admitting: Emergency Medicine

## 2015-06-15 ENCOUNTER — Emergency Department
Admission: EM | Admit: 2015-06-15 | Discharge: 2015-06-16 | Disposition: A | Discharge status: Home / Self Care | Payer: Medicare Other | Attending: Emergency Medicine | Admitting: Emergency Medicine

## 2015-06-15 ENCOUNTER — Emergency Department: Payer: Medicare Other

## 2015-06-15 DIAGNOSIS — Z794 Long term (current) use of insulin: Secondary | ICD-10-CM | POA: Diagnosis not present

## 2015-06-15 DIAGNOSIS — R109 Unspecified abdominal pain: Secondary | ICD-10-CM | POA: Diagnosis not present

## 2015-06-15 DIAGNOSIS — R911 Solitary pulmonary nodule: Secondary | ICD-10-CM | POA: Diagnosis not present

## 2015-06-15 DIAGNOSIS — N183 Chronic kidney disease, stage 3 (moderate): Secondary | ICD-10-CM | POA: Diagnosis not present

## 2015-06-15 DIAGNOSIS — F332 Major depressive disorder, recurrent severe without psychotic features: Secondary | ICD-10-CM | POA: Insufficient documentation

## 2015-06-15 DIAGNOSIS — A0472 Enterocolitis due to Clostridium difficile, not specified as recurrent: Secondary | ICD-10-CM

## 2015-06-15 DIAGNOSIS — I129 Hypertensive chronic kidney disease with stage 1 through stage 4 chronic kidney disease, or unspecified chronic kidney disease: Secondary | ICD-10-CM | POA: Insufficient documentation

## 2015-06-15 DIAGNOSIS — Z79899 Other long term (current) drug therapy: Secondary | ICD-10-CM | POA: Insufficient documentation

## 2015-06-15 DIAGNOSIS — Z7982 Long term (current) use of aspirin: Secondary | ICD-10-CM | POA: Insufficient documentation

## 2015-06-15 DIAGNOSIS — Z7902 Long term (current) use of antithrombotics/antiplatelets: Secondary | ICD-10-CM | POA: Diagnosis not present

## 2015-06-15 DIAGNOSIS — F4320 Adjustment disorder, unspecified: Secondary | ICD-10-CM

## 2015-06-15 DIAGNOSIS — F4321 Adjustment disorder with depressed mood: Secondary | ICD-10-CM | POA: Diagnosis not present

## 2015-06-15 DIAGNOSIS — F322 Major depressive disorder, single episode, severe without psychotic features: Secondary | ICD-10-CM

## 2015-06-15 DIAGNOSIS — E119 Type 2 diabetes mellitus without complications: Secondary | ICD-10-CM | POA: Insufficient documentation

## 2015-06-15 DIAGNOSIS — Z792 Long term (current) use of antibiotics: Secondary | ICD-10-CM | POA: Diagnosis not present

## 2015-06-15 LAB — COMPREHENSIVE METABOLIC PANEL
ALT: 25 U/L (ref 14–54)
AST: 29 U/L (ref 15–41)
Albumin: 3.5 g/dL (ref 3.5–5.0)
Alkaline Phosphatase: 122 U/L (ref 38–126)
Anion gap: 11 (ref 5–15)
BILIRUBIN TOTAL: 0.8 mg/dL (ref 0.3–1.2)
BUN: 22 mg/dL — AB (ref 6–20)
CHLORIDE: 106 mmol/L (ref 101–111)
CO2: 21 mmol/L — ABNORMAL LOW (ref 22–32)
CREATININE: 1.58 mg/dL — AB (ref 0.44–1.00)
Calcium: 8.7 mg/dL — ABNORMAL LOW (ref 8.9–10.3)
GFR, EST AFRICAN AMERICAN: 39 mL/min — AB (ref >60)
GFR, EST NON AFRICAN AMERICAN: 33 mL/min — AB (ref >60)
Glucose, Bld: 117 mg/dL — ABNORMAL HIGH (ref 65–99)
POTASSIUM: 3.9 mmol/L (ref 3.5–5.1)
Sodium: 138 mmol/L (ref 135–145)
TOTAL PROTEIN: 7 g/dL (ref 6.5–8.1)

## 2015-06-15 LAB — CBC WITH DIFFERENTIAL/PLATELET
BASOS PCT: 1 %
Basophils Absolute: 0.1 10*3/uL (ref 0–0.1)
EOS ABS: 0.1 10*3/uL (ref 0–0.7)
EOS PCT: 2 %
HCT: 32.7 % — ABNORMAL LOW (ref 35.0–47.0)
Hemoglobin: 10.9 g/dL — ABNORMAL LOW (ref 12.0–16.0)
LYMPHS ABS: 1.7 10*3/uL (ref 1.0–3.6)
Lymphocytes Relative: 22 %
MCH: 30.1 pg (ref 26.0–34.0)
MCHC: 33.3 g/dL (ref 32.0–36.0)
MCV: 90.3 fL (ref 80.0–100.0)
MONOS PCT: 8 %
Monocytes Absolute: 0.6 10*3/uL (ref 0.2–0.9)
Neutro Abs: 5.1 10*3/uL (ref 1.4–6.5)
Neutrophils Relative %: 67 %
PLATELETS: 343 10*3/uL (ref 150–440)
RBC: 3.62 MIL/uL — ABNORMAL LOW (ref 3.80–5.20)
RDW: 13.3 % (ref 11.5–14.5)
WBC: 7.6 10*3/uL (ref 3.6–11.0)

## 2015-06-15 LAB — URINALYSIS COMPLETE WITH MICROSCOPIC (ARMC ONLY)
BILIRUBIN URINE: NEGATIVE
GLUCOSE, UA: NEGATIVE mg/dL
Hgb urine dipstick: NEGATIVE
KETONES UR: NEGATIVE mg/dL
NITRITE: NEGATIVE
PROTEIN: NEGATIVE mg/dL
Specific Gravity, Urine: 1.012 (ref 1.005–1.030)
pH: 5 (ref 5.0–8.0)

## 2015-06-15 LAB — LACTIC ACID, PLASMA: Lactic Acid, Venous: 0.9 mmol/L (ref 0.5–2.0)

## 2015-06-15 LAB — LIPASE, BLOOD: LIPASE: 52 U/L — AB (ref 11–51)

## 2015-06-15 LAB — TROPONIN I

## 2015-06-15 MED ORDER — IOHEXOL 240 MG/ML SOLN
25.0000 mL | Freq: Once | INTRAMUSCULAR | Status: AC | PRN
  Administered 2015-06-15: 25 mL via ORAL

## 2015-06-15 MED ORDER — IOHEXOL 300 MG/ML  SOLN
80.0000 mL | Freq: Once | INTRAMUSCULAR | Status: AC | PRN
  Administered 2015-06-15: 80 mL via INTRAVENOUS

## 2015-06-15 MED ORDER — MORPHINE SULFATE (PF) 4 MG/ML IV SOLN
4.0000 mg | Freq: Once | INTRAVENOUS | Status: DC
Start: 2015-06-15 — End: 2015-06-16

## 2015-06-15 MED ORDER — ONDANSETRON HCL 4 MG/2ML IJ SOLN
4.0000 mg | Freq: Once | INTRAMUSCULAR | Status: AC
  Administered 2015-06-15: 4 mg via INTRAVENOUS
  Filled 2015-06-15: qty 2

## 2015-06-15 NOTE — ED Provider Notes (Addendum)
[facility] Emergency Department Provider Note  ____________________________________________  Time seen: Approximately 8:04 PM  I have reviewed the triage vital signs and the nursing notes.   HISTORY  Chief Complaint Abdominal Pain    HPI Dorothy Rogers is a 65 y.o. female who was seen recently and diagnosed with C. difficile. She's been taking her Flagyl for 5 days now usually she reports her abdomen was feeling pretty well when she laid down and only hurt if she got up to go the bathroom. Today her abdomen is been very painful especially in the lower part of the abdomen all day long whether she's been lying or standing in a matter what. She's felt hot but has not run a fever. She did not have diarrhea today only had a soft stool. She is very nauseated but has not had any vomiting. She has not had any shortness of breath or dysuria or pain anywhere else. Patient reports she's very lightheaded when she stands up.   Past Medical History  Diagnosis Date  . Blood transfusion without reported diagnosis   . Cataract   . Diabetes mellitus without complication ([place])   . Hyperlipidemia   . Hypertension   . Herpes simplex   . Coronary artery disease   . Anemia   . Vitamin B 12 deficiency   . Cancer ([place])     tumor back of neck-fibroushistocytoma  . Arthritis     lower back, hips  . Chronic kidney disease     Stage III  . Vertigo     occasional, no episodes in 2-3 months    Patient Active Problem List   Diagnosis Date Noted  . Intractable nausea and vomiting 06/09/2015  . Dehydration 06/09/2015  . Type 2 diabetes mellitus ([place]) 06/09/2015  . HLD (hyperlipidemia) 06/09/2015  . HTN (hypertension) 06/09/2015  . CAD (coronary artery disease) 06/09/2015  . CKD (chronic kidney disease), stage III 06/09/2015  . 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 Status post lumbar spinal fusion 12/25/2014  . DDD (degenerative disc disease), lumbar 11/18/2014  .  Lumbar post-laminectomy syndrome 11/18/2014  . Facet syndrome, lumbar 11/18/2014  . Piriformis syndrome of left side 11/18/2014    Past Surgical History  Procedure Laterality Date  . Cataract extraction Left   . Coronary stent placement  03/2008  . Cardiac catheterization  2009  . Eye surgery Left     cataract extraction  . Abdominal hysterectomy    . Spine surgery      2001, 2014, 2015  . Lumbar fusion  2001, 2014, 2015  . Cystectomy Right     wrist  . Tumor excision      back of neck  . Cataract extraction w/phaco Right 03/19/2015    Procedure: CATARACT EXTRACTION PHACO AND INTRAOCULAR LENS PLACEMENT (IOC);  Surgeon: Leandrew Koyanagi, MD;  Location: [facility];  Service: Ophthalmology;  Laterality: Right;  DIABETIC - insulin and oral meds  . Back surgery      Lumbar spinal fusion 05/30/2015    Current Outpatient Rx  Name  Route  Sig  Dispense  Refill  . acetaminophen (TYLENOL) 325 MG tablet   Oral   Take 650 mg by mouth every 6 (six) hours as needed for moderate pain, fever or headache.          Marland Kitchen acetaminophen-codeine (TYLENOL #3) 300-30 MG tablet   Oral   Take 1 tablet by mouth every 4 (four) hours as needed for moderate pain.         Marland Kitchen  acyclovir (ZOVIRAX) 400 MG tablet   Oral   Take 400 mg by mouth 2 (two) times daily as needed (fever blisters).         Marland Kitchen amLODipine (NORVASC) 5 MG tablet   Oral   Take 5 mg by mouth daily.          Marland Kitchen aspirin EC 81 MG tablet   Oral   Take 81 mg by mouth at bedtime.         Marland Kitchen atorvastatin (LIPITOR) 40 MG tablet   Oral   Take 40 mg by mouth at bedtime.          . Biotin 5000 MCG TABS   Oral   Take 5,000 mcg by mouth daily.          . Calcium Carbonate-Vitamin D 600-400 MG-UNIT per tablet   Oral   Take 1 tablet by mouth daily.          . clopidogrel (PLAVIX) 75 MG tablet   Oral   Take 75 mg by mouth daily. Medication currently on hold until stitches are removed         . Cyanocobalamin (RA  VITAMIN B-12 TR) 1000 MCG TBCR   Oral   Take 1 tablet by mouth daily.          Marland Kitchen ezetimibe (ZETIA) 10 MG tablet   Oral   Take 10 mg by mouth at bedtime.          . Ferrous Sulfate (IRON) 325 (65 FE) MG TABS   Oral   Take 1 tablet by mouth daily.          Marland Kitchen GLUCERNA (GLUCERNA) LIQD   Oral   Take 1 Can by mouth 2 (two) times daily between meals.   30 Can   0   . Insulin Glargine (LANTUS SOLOSTAR) 100 UNIT/ML Solostar Pen   Subcutaneous   Inject 14 Units into the skin daily. Inject 14 units until you are able to tolerate normal diet. Continue to   15 mL   11   . losartan (COZAAR) 100 MG tablet   Oral   Take 100 mg by mouth daily.          . meclizine (ANTIVERT) 25 MG tablet   Oral   Take 25 mg by mouth 3 (three) times daily as needed for dizziness.         . metroNIDAZOLE (FLAGYL) 500 MG tablet   Oral   Take 1 tablet (500 mg total) by mouth 3 (three) times daily.   42 tablet   0   . ondansetron (ZOFRAN) 4 MG tablet   Oral   Take 1 tablet (4 mg total) by mouth every 8 (eight) hours as needed for nausea or vomiting.   20 tablet   0     Allergies Beta adrenergic blockers; Celebrex; Niacin and related; Shellfish allergy; and Vicodin  Family History  Problem Relation Age of Onset  . Heart disease Father     Social History Social History  Substance Use Topics  . Smoking status: Never Smoker   . Smokeless tobacco: None  . Alcohol Use: 0.0 oz/week    0 Standard drinks or equivalent per week     Comment: 2-3 drinks per year    Review of Systems Constitutional: No fever/chills Eyes: No visual changes. ENT: No sore throat. Cardiovascular: Denies chest pain. Respiratory: Denies shortness of breath. Gastrointestinal: See history of present illness Genitourinary: Negative for dysuria. Musculoskeletal: Negative for back pain.  Skin: Negative for rash. Neurological: Negative for headaches, focal weakness or numbness.  10-point ROS otherwise  negative.  ____________________________________________   PHYSICAL EXAM:  VITAL SIGNS: ED Triage Vitals  Enc Vitals Group     BP --      Pulse --      Resp --      Temp --      Temp src --      SpO2 --      Weight --      Height --      Head Cir --      Peak Flow --      Pain Score --      Pain Loc --      Pain Edu? --      Excl. in [place]? --     Constitutional: Alert and oriented. Well appearing and in no acute distress. Eyes: Conjunctivae are normal. PERRL. EOMI. Head: Atraumatic. Nose: No congestion/rhinnorhea. Mouth/Throat: Mucous membranes are moist.  Oropharynx non-erythematous. Neck: No stridor.   Cardiovascular: Normal rate, regular rhythm. Grossly normal heart sounds.  Good peripheral circulation. Respiratory: Normal respiratory effort.  No retractions. Lungs CTAB. Gastrointestinal: Soft and nontender. No distention. No abdominal bruits. No CVA tenderness. }Musculoskeletal: No lower extremity tenderness nor edema.  No joint effusions. Neurologic:  Normal speech and language. No gross focal neurologic deficits are appreciated. No gait instability. Skin:  Skin is warm, dry and intact. No rash noted. Psychiatric: Mood and affect are normal. Speech and behavior are normal.  ____________________________________________   LABS (all labs ordered are listed, but only abnormal results are displayed)  Labs Reviewed  COMPREHENSIVE METABOLIC PANEL - Abnormal; Notable for the following:    CO2 21 (*)    Glucose, Bld 117 (*)    BUN 22 (*)    Creatinine, Ser 1.58 (*)    Calcium 8.7 (*)    GFR calc non Af Amer 33 (*)    GFR calc Af Amer 39 (*)    All other components within normal limits  LIPASE, BLOOD - Abnormal; Notable for the following:    Lipase 52 (*)    All other components within normal limits  CBC WITH DIFFERENTIAL/PLATELET - Abnormal; Notable for the following:    RBC 3.62 (*)    Hemoglobin 10.9 (*)    HCT 32.7 (*)    All other components within normal  limits  URINALYSIS COMPLETEWITH MICROSCOPIC ([facility] ONLY) - Abnormal; Notable for the following:    Color, Urine YELLOW (*)    APPearance CLEAR (*)    Leukocytes, UA 2+ (*)    Bacteria, UA RARE (*)    Squamous Epithelial / LPF 0-5 (*)    All other components within normal limits  TROPONIN I  LACTIC ACID, PLASMA  LACTIC ACID, PLASMA   ____________________________________________  EKG  EKG read and interpreted by me shows sinus tachycardia 108 normal axis no acute disease ____________________________________________  RADIOLOGY  CT of the abdomen read by radiology reviewed by me lives or other acute abdominal pathology ____________________________________________   PROCEDURES  Because the patient told the nurse that she was very depressed and was thinking of killing herself and had a plan but would not tell the nurse what was I have committed the patient. We will continue to watch the patient and wait for the psychiatrist to come and evaluate her mental status. In the meantime we can continue to evaluate her abdominal pain  ____________________________________________   INITIAL IMPRESSION / ASSESSMENT AND PLAN /  ED COURSE  Pertinent labs & imaging results that were available during my care of the patient were reviewed by me and considered in my medical decision making (see chart for details).   ____________________________________________   FINAL CLINICAL IMPRESSION(S) / ED DIAGNOSES  Final diagnoses:  Abdominal pain, unspecified abdominal location  Severe depression      Nena Polio, MD 06/16/15 [phone]  I had a long discussion with the patient and her family. She is very tired of feeling ill. She is taking the Flagyl for the C. difficile and says it is long she is laying still and takes Zofran she feels okay but if she gets up to try to move she gets very nauseated or stomach feels bad and she cannot eat anything. She says she's lost 8-10 pounds in the last week or  so. She is very worried about weight loss. I have attempted to reassure her that she is not dehydrated and a little bit of weight loss will not harm her at all. I told her that as long as she is taking the antibiotics and the C. difficile is not completely gone she probably will feel nauseated we can try to counteract this with using high doses Zofran possibly Pepto-Bismol and/or yogurt with active cultures 3 times a day. I told her that sometimes people that have had a bad episode of gastroenteritis from a virus or salmonella or C. difficile get irritable bowel syndrome and get these kind of rhomboid crampy feelings for weeks or sometimes even months afterwards. There are a few things I told her that we can do about this but right this very minute while she is taking the Flagyl there is not a lot we can do. I have increased her Zofran to 8 mg ODT 3 times a day ordered some Pepto-Bismol for her that works and asked for dietary to send yogurt 3 times a day if not I've asked the family to bring in some yogurt with active cultures show player Dannen or some similar brand. Patient is upset and appears to be depressed at the at the news that I cannot make her better. He will monitor her for the time being and have psychiatry talked to her in the morning. And we'll go from there.  Nena Polio, MD 06/16/15 [phone]
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
## 2015-06-15 NOTE — ED Notes (Signed)
Pt reports feeling very ill today. Pt has been diagnosed with C-diff x5 days ago. Pt is on day 5 of 14 day Flagyl. Pt is pale and weak at this time with NAD noted.

## 2015-06-16 DIAGNOSIS — F4321 Adjustment disorder with depressed mood: Secondary | ICD-10-CM

## 2015-06-16 DIAGNOSIS — F4320 Adjustment disorder, unspecified: Secondary | ICD-10-CM

## 2015-06-16 DIAGNOSIS — R109 Unspecified abdominal pain: Secondary | ICD-10-CM

## 2015-06-16 DIAGNOSIS — A0472 Enterocolitis due to Clostridium difficile, not specified as recurrent: Secondary | ICD-10-CM

## 2015-06-16 LAB — LACTIC ACID, PLASMA: Lactic Acid, Venous: 1 mmol/L (ref 0.5–2.0)

## 2015-06-16 LAB — CBC
HEMATOCRIT: 30 % — AB (ref 35.0–47.0)
HEMOGLOBIN: 10.1 g/dL — AB (ref 12.0–16.0)
MCH: 29.7 pg (ref 26.0–34.0)
MCHC: 33.6 g/dL (ref 32.0–36.0)
MCV: 88.2 fL (ref 80.0–100.0)
Platelets: 313 10*3/uL (ref 150–440)
RBC: 3.4 MIL/uL — ABNORMAL LOW (ref 3.80–5.20)
RDW: 13.3 % (ref 11.5–14.5)
WBC: 6.5 10*3/uL (ref 3.6–11.0)

## 2015-06-16 LAB — GLUCOSE, CAPILLARY: GLUCOSE-CAPILLARY: 174 mg/dL — AB (ref 65–99)

## 2015-06-16 MED ORDER — ALUM & MAG HYDROXIDE-SIMETH 200-200-20 MG/5ML PO SUSP
15.0000 mL | Freq: Once | ORAL | Status: AC
  Administered 2015-06-16: 15 mL via ORAL
  Filled 2015-06-16: qty 30

## 2015-06-16 MED ORDER — ONDANSETRON HCL 4 MG/2ML IJ SOLN
4.0000 mg | Freq: Once | INTRAMUSCULAR | Status: AC
  Administered 2015-06-16: 4 mg via INTRAVENOUS
  Filled 2015-06-16: qty 2

## 2015-06-16 MED ORDER — LOSARTAN POTASSIUM 25 MG PO TABS
100.0000 mg | ORAL_TABLET | Freq: Once | ORAL | Status: AC
  Administered 2015-06-16: 100 mg via ORAL
  Filled 2015-06-16: qty 4

## 2015-06-16 MED ORDER — ONDANSETRON 8 MG PO TBDP
8.0000 mg | ORAL_TABLET | Freq: Three times a day (TID) | ORAL | Status: DC | PRN
  Administered 2015-06-16 (×3): 8 mg via ORAL
  Filled 2015-06-16 (×3): qty 1

## 2015-06-16 MED ORDER — ATORVASTATIN CALCIUM 20 MG PO TABS
ORAL_TABLET | ORAL | Status: AC
  Filled 2015-06-16: qty 2

## 2015-06-16 MED ORDER — METOCLOPRAMIDE HCL 5 MG PO TABS: 5.0000 mg | ORAL_TABLET | Freq: Three times a day (TID) | ORAL | Status: DC

## 2015-06-16 MED ORDER — ATORVASTATIN CALCIUM 20 MG PO TABS
40.0000 mg | ORAL_TABLET | Freq: Every day | ORAL | Status: AC
  Administered 2015-06-16: 40 mg via ORAL

## 2015-06-16 MED ORDER — CLOPIDOGREL BISULFATE 75 MG PO TABS
75.0000 mg | ORAL_TABLET | Freq: Once | ORAL | Status: AC
  Administered 2015-06-16: 75 mg via ORAL
  Filled 2015-06-16: qty 1

## 2015-06-16 MED ORDER — BISMUTH SUBSALICYLATE 262 MG/15ML PO SUSP: 30.0000 mL | Freq: Three times a day (TID) | ORAL | Status: DC

## 2015-06-16 MED ORDER — AMLODIPINE BESYLATE 5 MG PO TABS
5.0000 mg | ORAL_TABLET | Freq: Once | ORAL | Status: AC
  Administered 2015-06-16: 5 mg via ORAL
  Filled 2015-06-16: qty 1

## 2015-06-16 MED ORDER — EZETIMIBE 10 MG PO TABS
10.0000 mg | ORAL_TABLET | Freq: Every day | ORAL | Status: DC
  Administered 2015-06-16: 10 mg via ORAL
  Filled 2015-06-16: qty 1

## 2015-06-16 MED ORDER — ASPIRIN 81 MG PO CHEW
81.0000 mg | CHEWABLE_TABLET | Freq: Once | ORAL | Status: AC
  Administered 2015-06-16: 81 mg via ORAL
  Filled 2015-06-16: qty 1

## 2015-06-16 NOTE — Consult Note (Signed)
[facility] Consult   Reason for Consult:  Consult for this 65 year old woman who answered positively about suicidal ideation on screening questions Referring Physician:  Thomasene Lot Patient Identification: Mariem Skolnick MRN:  [id number] Principal Diagnosis: Adjustment disorder Diagnosis:   Patient Active Problem List   Diagnosis Date Noted  . Adjustment disorder [F43.20] 06/16/2015  . Abdominal pain [R10.9] 06/16/2015  . C. difficile colitis [A04.7] 06/16/2015  . Intractable nausea and vomiting [R11.10] 06/09/2015  . Dehydration [E86.0] 06/09/2015  . Type 2 diabetes mellitus ([place]) [E11.9] 06/09/2015  . HLD (hyperlipidemia) [E78.5] 06/09/2015  . HTN (hypertension) [I10] 06/09/2015  . CAD (coronary artery disease) [I25.10] 06/09/2015  . CKD (chronic kidney disease), stage III [N18.3] 06/09/2015  . 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 Status post lumbar spinal fusion [Z98.1] 12/25/2014  . DDD (degenerative disc disease), lumbar [M51.36] 11/18/2014  . Lumbar post-laminectomy syndrome [M96.1] 11/18/2014  . Facet syndrome, lumbar [M54.5] 11/18/2014  . Piriformis syndrome of left side [G57.02] 11/18/2014    Total Time spent with patient: 1 hour  Subjective:   Kalena Mander is a 65 y.o. female patient admitted with "I've just been very sick to my stomach".  HPI:  Patient interviewed. Husband and son contributed. Chart reviewed. Case discussed with emergency room physician. 65 year old woman came into the emergency room for nausea and persistent abdominal discomfort. During screening she answered positively when asked about any recent suicidal ideation. Patient is suffering from C. difficile colitis which has been only gradually getting better as she is taking antibiotics. On top of that she has chronic back pain. She has been feeling sick to her stomach for well over a week with limited appetite. She says that during the worst of that she started to feel  hopeless and had some thoughts of wishing she were dead. She never acted on it and had not seen it as being a major problem. She says that her mood now is feeling much better. She feels hopeful and optimistic. She knows that she is getting better. Currently denies any suicidal thoughts at all. Not having any psychotic symptoms. Currently not getting any treatment for any mental health problems.  Social history: Patient lives with her husband. Apparently has a good relationship with him. Son lives nearby and is also supportive. It's reported that there are firearms in the house but without ammunition and they are out of the patient's ability to access them.  Medical history: Patient has multiple medical problems including diabetes high blood pressure elevated cholesterol and a history of coronary artery disease. She is status post back surgery about a month ago for an episode of degenerative disc disease. Soon after the back surgery she developed diarrhea and nausea and abdominal pain and has been diagnosed with Clostridium difficile which is now being treated with antibiotics.  Substance abuse history: Denies alcohol or drug abuse denies any past substance abuse problems.  Past Psychiatric History: Patient denies any past history of mental health treatment. No history of psychiatric medicine. No history of suicide attempts in the past.  Risk to Self: Suicidal Ideation: No-Not Currently/Within Last 6 Months Suicidal Intent: No-Not Currently/Within Last 6 Months Is patient at risk for suicide?: No Suicidal Plan?: Yes-Currently Present Specify Current Suicidal Plan: Overdose Access to Means:  (Not currently ) What has been your use of drugs/alcohol within the last 12 months?: denied usage How many times?: 0 Other Self Harm Risks: denied Triggers for Past Attempts: Unknown Intentional Self Injurious Behavior: None Risk to  Others: Homicidal Ideation: No Thoughts of Harm to Others: No Current  Homicidal Intent: No Current Homicidal Plan: No Access to Homicidal Means: No Identified Victim: denied History of harm to others?: No Assessment of Violence: None Noted Violent Behavior Description: denied Does patient have access to weapons?: No Criminal Charges Pending?: No Does patient have a court date: No Prior Inpatient Therapy: Prior Inpatient Therapy: No Prior Therapy Dates: n/a Prior Therapy Facilty/Provider(s): n/a Reason for Treatment: n/a Prior Outpatient Therapy: Prior Therapy Dates: ` Prior Therapy Facilty/Provider(s): n/a Reason for Treatment: n/a Does patient have an ACCT team?: No Does patient have Intensive In-House Services?  : No Does patient have [facility] services? : No Does patient have P4CC services?: No  Past Medical History:  Past Medical History  Diagnosis Date  . Blood transfusion without reported diagnosis   . Cataract   . Diabetes mellitus without complication ([place])   . Hyperlipidemia   . Hypertension   . Herpes simplex   . Coronary artery disease   . Anemia   . Vitamin B 12 deficiency   . Cancer ([place])     tumor back of neck-fibroushistocytoma  . Arthritis     lower back, hips  . Chronic kidney disease     Stage III  . Vertigo     occasional, no episodes in 2-3 months    Past Surgical History  Procedure Laterality Date  . Cataract extraction Left   . Coronary stent placement  03/2008  . Cardiac catheterization  2009  . Eye surgery Left     cataract extraction  . Abdominal hysterectomy    . Spine surgery      2001, 2014, 2015  . Lumbar fusion  2001, 2014, 2015  . Cystectomy Right     wrist  . Tumor excision      back of neck  . Cataract extraction w/phaco Right 03/19/2015    Procedure: CATARACT EXTRACTION PHACO AND INTRAOCULAR LENS PLACEMENT (IOC);  Surgeon: Leandrew Koyanagi, MD;  Location: [facility];  Service: Ophthalmology;  Laterality: Right;  DIABETIC - insulin and oral meds  . Back surgery      Lumbar spinal  fusion 05/30/2015   Family History:  Family History  Problem Relation Age of Onset  . Heart disease Father    Family Psychiatric  History: There is no known family history of mental health problems Social History:  History  Alcohol Use  . 0.0 oz/week  . 0 Standard drinks or equivalent per week    Comment: 2-3 drinks per year     History  Drug Use No    Social History   Social History  . Marital Status: Married    Spouse Name: N/A  . Number of Children: N/A  . Years of Education: N/A   Social History Main Topics  . Smoking status: Never Smoker   . Smokeless tobacco: None  . Alcohol Use: 0.0 oz/week    0 Standard drinks or equivalent per week     Comment: 2-3 drinks per year  . Drug Use: No  . Sexual Activity: Not Asked   Other Topics Concern  . None   Social History Narrative   Additional Social History:    History of alcohol / drug use?: No history of alcohol / drug abuse                     Allergies:   Allergies  Allergen Reactions  . Beta Adrenergic Blockers Hives  .  Celebrex [Celecoxib] Other (See Comments)    A lot of pressure in head  . Niacin And Related Other (See Comments)    Flushing and hot sensation  . Shellfish Allergy Nausea And Vomiting  . Vicodin [Hydrocodone-Acetaminophen] Other (See Comments)    Hypoglycemic episodes    Labs:  Results for orders placed or performed during the hospital encounter of 06/15/15 (from the past 48 hour(s))  Comprehensive metabolic panel     Status: Abnormal   Collection Time: 06/15/15  8:34 PM  Result Value Ref Range   Sodium 138 135 - 145 mmol/L   Potassium 3.9 3.5 - 5.1 mmol/L   Chloride 106 101 - 111 mmol/L   CO2 21 (L) 22 - 32 mmol/L   Glucose, Bld 117 (H) 65 - 99 mg/dL   BUN 22 (H) 6 - 20 mg/dL   Creatinine, Ser 1.58 (H) 0.44 - 1.00 mg/dL   Calcium 8.7 (L) 8.9 - 10.3 mg/dL   Total Protein 7.0 6.5 - 8.1 g/dL   Albumin 3.5 3.5 - 5.0 g/dL   AST 29 15 - 41 U/L   ALT 25 14 - 54 U/L    Alkaline Phosphatase 122 38 - 126 U/L   Total Bilirubin 0.8 0.3 - 1.2 mg/dL   GFR calc non Af Amer 33 (L) >60 mL/min   GFR calc Af Amer 39 (L) >60 mL/min    Comment: (NOTE) The eGFR has been calculated using the CKD EPI equation. This calculation has not been validated in all clinical situations. eGFR's persistently <60 mL/min signify possible Chronic Kidney Disease.    Anion gap 11 5 - 15  Lipase, blood     Status: Abnormal   Collection Time: 06/15/15  8:34 PM  Result Value Ref Range   Lipase 52 (H) 11 - 51 U/L  Troponin I     Status: None   Collection Time: 06/15/15  8:34 PM  Result Value Ref Range   Troponin I <0.03 <0.031 ng/mL    Comment:        NO INDICATION OF MYOCARDIAL INJURY.   Lactic acid, plasma     Status: None   Collection Time: 06/15/15  8:34 PM  Result Value Ref Range   Lactic Acid, Venous 0.9 0.5 - 2.0 mmol/L  CBC with Differential     Status: Abnormal   Collection Time: 06/15/15  8:34 PM  Result Value Ref Range   WBC 7.6 3.6 - 11.0 K/uL   RBC 3.62 (L) 3.80 - 5.20 MIL/uL   Hemoglobin 10.9 (L) 12.0 - 16.0 g/dL   HCT 32.7 (L) 35.0 - 47.0 %   MCV 90.3 80.0 - 100.0 fL   MCH 30.1 26.0 - 34.0 pg   MCHC 33.3 32.0 - 36.0 g/dL   RDW 13.3 11.5 - 14.5 %   Platelets 343 150 - 440 K/uL   Neutrophils Relative % 67 %   Neutro Abs 5.1 1.4 - 6.5 K/uL   Lymphocytes Relative 22 %   Lymphs Abs 1.7 1.0 - 3.6 K/uL   Monocytes Relative 8 %   Monocytes Absolute 0.6 0.2 - 0.9 K/uL   Eosinophils Relative 2 %   Eosinophils Absolute 0.1 0 - 0.7 K/uL   Basophils Relative 1 %   Basophils Absolute 0.1 0 - 0.1 K/uL  Urinalysis complete, with microscopic     Status: Abnormal   Collection Time: 06/15/15  8:34 PM  Result Value Ref Range   Color, Urine YELLOW (A) YELLOW   APPearance CLEAR (A)  CLEAR   Glucose, UA NEGATIVE NEGATIVE mg/dL   Bilirubin Urine NEGATIVE NEGATIVE   Ketones, ur NEGATIVE NEGATIVE mg/dL   Specific Gravity, Urine 1.012 1.005 - 1.030   Hgb urine dipstick  NEGATIVE NEGATIVE   pH 5.0 5.0 - 8.0   Protein, ur NEGATIVE NEGATIVE mg/dL   Nitrite NEGATIVE NEGATIVE   Leukocytes, UA 2+ (A) NEGATIVE   RBC / HPF 0-5 0 - 5 RBC/hpf   WBC, UA 0-5 0 - 5 WBC/hpf   Bacteria, UA RARE (A) NONE SEEN   Squamous Epithelial / LPF 0-5 (A) NONE SEEN   Mucous PRESENT   Lactic acid, plasma     Status: None   Collection Time: 06/15/15 11:43 PM  Result Value Ref Range   Lactic Acid, Venous 1.0 0.5 - 2.0 mmol/L  CBC     Status: Abnormal   Collection Time: 06/16/15  6:33 AM  Result Value Ref Range   WBC 6.5 3.6 - 11.0 K/uL   RBC 3.40 (L) 3.80 - 5.20 MIL/uL   Hemoglobin 10.1 (L) 12.0 - 16.0 g/dL   HCT 30.0 (L) 35.0 - 47.0 %   MCV 88.2 80.0 - 100.0 fL   MCH 29.7 26.0 - 34.0 pg   MCHC 33.6 32.0 - 36.0 g/dL   RDW 13.3 11.5 - 14.5 %   Platelets 313 150 - 440 K/uL  Glucose, capillary     Status: Abnormal   Collection Time: 06/16/15 11:32 AM  Result Value Ref Range   Glucose-Capillary 174 (H) 65 - 99 mg/dL    Current Facility-Administered Medications  Medication Dose Route Frequency Provider Last Rate Last Dose  . ezetimibe (ZETIA) tablet 10 mg  10 mg Oral Daily Nena Polio, MD   10 mg at 06/16/15 0244  . morphine 4 MG/ML injection 4 mg  4 mg Intravenous Once Nena Polio, MD   4 mg at 06/15/15 2105  . ondansetron (ZOFRAN-ODT) disintegrating tablet 8 mg  8 mg Oral Q8H PRN Nena Polio, MD   8 mg at 06/16/15 1602   Current Outpatient Prescriptions  Medication Sig Dispense Refill  . acetaminophen (TYLENOL) 325 MG tablet Take 650 mg by mouth every 6 (six) hours as needed for moderate pain, fever or headache.     Marland Kitchen acetaminophen-codeine (TYLENOL #3) 300-30 MG tablet Take 1 tablet by mouth every 4 (four) hours as needed for moderate pain.    Marland Kitchen acyclovir (ZOVIRAX) 400 MG tablet Take 400 mg by mouth 2 (two) times daily as needed (fever blisters).    Marland Kitchen amLODipine (NORVASC) 5 MG tablet Take 5 mg by mouth daily.     Marland Kitchen aspirin EC 81 MG tablet Take 81 mg by mouth at  bedtime.    Marland Kitchen atorvastatin (LIPITOR) 40 MG tablet Take 40 mg by mouth at bedtime.     . Biotin 5000 MCG TABS Take 5,000 mcg by mouth daily.     . Calcium Carbonate-Vitamin D 600-400 MG-UNIT per tablet Take 1 tablet by mouth daily.     . clopidogrel (PLAVIX) 75 MG tablet Take 75 mg by mouth daily. Medication currently on hold until stitches are removed    . Cyanocobalamin (RA VITAMIN B-12 TR) 1000 MCG TBCR Take 1 tablet by mouth daily.     Marland Kitchen ezetimibe (ZETIA) 10 MG tablet Take 10 mg by mouth at bedtime.     . Ferrous Sulfate (IRON) 325 (65 FE) MG TABS Take 1 tablet by mouth daily.     Marland Kitchen [place] ([place])  LIQD Take 1 Can by mouth 2 (two) times daily between meals. 30 Can 0  . Insulin Glargine (LANTUS SOLOSTAR) 100 UNIT/ML Solostar Pen Inject 14 Units into the skin daily. Inject 14 units until you are able to tolerate normal diet. Continue to 15 mL 11  . losartan (COZAAR) 100 MG tablet Take 100 mg by mouth daily.     . meclizine (ANTIVERT) 25 MG tablet Take 25 mg by mouth 3 (three) times daily as needed for dizziness.    . metroNIDAZOLE (FLAGYL) 500 MG tablet Take 1 tablet (500 mg total) by mouth 3 (three) times daily. 42 tablet 0  . ondansetron (ZOFRAN) 4 MG tablet Take 1 tablet (4 mg total) by mouth every 8 (eight) hours as needed for nausea or vomiting. 20 tablet 0  . metoCLOPramide (REGLAN) 5 MG tablet Take 1 tablet (5 mg total) by mouth 3 (three) times daily. 15 tablet 0    Musculoskeletal: Strength & Muscle Tone: decreased Gait & Station: unsteady Patient leans: N/A  Psychiatric Specialty Exam: Review of Systems  Constitutional: Negative.   HENT: Negative.   Eyes: Negative.   Respiratory: Negative.   Cardiovascular: Negative.   Gastrointestinal: Positive for nausea, abdominal pain and diarrhea.  Musculoskeletal: Negative.   Skin: Negative.   Neurological: Negative.   Psychiatric/Behavioral: Positive for depression. Negative for suicidal ideas, hallucinations, memory loss and  substance abuse. The patient is not nervous/anxious and does not have insomnia.     Blood pressure 125/65, pulse 99, temperature 98.1 F (36.7 C), temperature source Oral, resp. rate 20, height 5' 2"  (1.575 m), weight 80.287 kg (177 lb), SpO2 99 %.Body mass index is 32.37 kg/(m^2).  General Appearance: Casual  Eye Contact::  Fair  Speech:  Normal Rate  Volume:  Normal  Mood:  Dysphoric  Affect:  Constricted  Thought Process:  Goal Directed  Orientation:  Full (Time, Place, and Person)  Thought Content:  Negative  Suicidal Thoughts:  No  Homicidal Thoughts:  No  Memory:  Immediate;   Fair Recent;   Fair Remote;   Fair  Judgement:  Fair  Insight:  Fair  Psychomotor Activity:  Decreased  Concentration:  Fair  Recall:  [employer] of Knowledge:Fair  Language: Fair  Akathisia:  No  Handed:  Right  AIMS (if indicated):     Assets:  Communication Skills Desire for Improvement Financial Resources/Insurance Housing Resilience Social Support  ADL's:  Intact  Cognition: WNL  Sleep:      Treatment Plan Summary: Plan 65 year old woman with multiple significant medical problems with answered positively when asked about any recent suicidal ideation. She is clear that she is not actually having any thoughts of acting on any suicidal thinking. Feels optimistic and hopeful for the future. No sign of psychosis. Family feels very comfortable with discharge home. Patient has multiple things she is positive about. Does not appear to be having a major depression but an adjustment disorder related to her physical discomfort which is being appropriately treated. Involuntary commitment discontinued. Patient can be released from the emergency room and will follow-up with her primary doctors. Not necessarily needing any mental health treatment.  Disposition: Patient does not meet criteria for psychiatric inpatient admission. Supportive therapy provided about ongoing stressors.  John Clapacs 06/16/2015  6:45 PM

## 2015-06-16 NOTE — ED Notes (Signed)
Per Dr. Izola Price pt okay to take abx from home as scheduled

## 2015-06-16 NOTE — ED Notes (Signed)
Pt given her flagyl PO, 1:1 sitter at bedside

## 2015-06-16 NOTE — ED Notes (Signed)
Pt given breakfast tray and hot tea, pt resting in bed eating, husband at bedside, charge nurse aware pts husband in room, pt has no complaints at this time

## 2015-06-16 NOTE — ED Notes (Signed)
Pt resting in bed in no distress, husband at bedside, sitter at bedside

## 2015-06-16 NOTE — ED Notes (Signed)
Pt resting in bed with eyes closed, sitter at bedside, pt in no acute distress

## 2015-06-16 NOTE — Progress Notes (Signed)
Initial Nutrition Assessment     INTERVENTION:  Meals and snacks: Cater to pt preferences.  Pt reports does not like vanilla yogurt ordered TID.  Will send strawberry for pt to try. Nutrition related medication: may benefit from probiotic being added if unable to eat yogurt Coordination of care: No further recommendations at this time. Please reconsult RD if needed.    NUTRITION DIAGNOSIS:   Inadequate oral intake related to altered GI function as evidenced by per patient/family report.    GOAL:   Patient will meet greater than or equal to 90% of their needs    MONITOR:    (Energy intake, Digestive system)  REASON FOR ASSESSMENT:   Consult    ASSESSMENT:      Pt in the ED with c-diff colitis, abdominal pain, diarrhea.  Psych to evaluate, unsure if pt will be admitted at this time.   Past Medical History  Diagnosis Date  . Blood transfusion without reported diagnosis   . Cataract   . Diabetes mellitus without complication ([place])   . Hyperlipidemia   . Hypertension   . Herpes simplex   . Coronary artery disease   . Anemia   . Vitamin B 12 deficiency   . Cancer ([place])     tumor back of neck-fibroushistocytoma  . Arthritis     lower back, hips  . Chronic kidney disease     Stage III  . Vertigo     occasional, no episodes in 2-3 months    Current Nutrition: ate 100% of [place] sandwich, 100% applesauce and 2 carrots.  Did not really like yogurt Spoke with RN  Gastrointestinal Profile: Last BM: no BM today   Scheduled Medications:  . ezetimibe  10 mg Oral Daily  .  morphine injection  4 mg Intravenous Once       Electrolyte/Renal Profile and Glucose Profile:   Recent Labs Lab 06/10/15 0447 06/15/15 2034  NA 143 138  K 3.8 3.9  CL 113* 106  CO2 22 21*  BUN 14 22*  CREATININE 1.34* 1.58*  CALCIUM 7.9* 8.7*  GLUCOSE 101* 117*   Protein Profile:  Recent Labs Lab 06/15/15 2034  ALBUMIN 3.5      Weight Trend since Admission: Filed  Weights   06/15/15 2004  Weight: 177 lb (80.287 kg)   Noted per wt encounters 5% wt loss in the last 6 days.     Diet Order:  Diet Carb Modified Fluid consistency:: Thin; Room service appropriate?: Yes  Skin:   reviewed   Height:   Ht Readings from Last 1 Encounters:  06/15/15 5\' 2"  (1.575 m)    Weight:   Wt Readings from Last 1 Encounters:  06/15/15 177 lb (80.287 kg)    Ideal Body Weight:     BMI:  Body mass index is 32.37 kg/(m^2).  Estimated Nutritional Needs:   Kcal:  BEE 998 kcals (IF 1.0-1.2, AF 1.3) [id number] kcals/d  Protein:  (1.0-1.2 g/kg) 50-60 g/d  Fluid:  (25-26ml/kg) 1250-1534ml/d  EDUCATION NEEDS:   No education needs identified at this time   Youssef Footman B. Zenia Resides, [place], [place] (pager) Weekend/On-Call pager [phone])

## 2015-06-16 NOTE — ED Notes (Signed)
Pt resting in bed in no acute distress, states no complaints, husband and sitter at bedside, pt taken off cardiac monitor

## 2015-06-16 NOTE — ED Notes (Signed)
Pt is getting hair washed.

## 2015-06-16 NOTE — ED Notes (Signed)
Pt given lunch tray, resting in bed eating, sitter at bedside

## 2015-06-16 NOTE — ED Provider Notes (Signed)
-----------------------------------------   6:24 PM on 06/16/2015 -----------------------------------------  Patient has been seen by psychiatry. They have rescinded the involuntary commitment and reports she is not suicidal. She is optimistic. Not depressed. She does not meet criteria for hospitalization.  The patient will continue her antibiotics for her current infectious diarrhea. She has had a problem with nausea from the metronidazole. We discussed continuing the Zofran she are as prescribed and I will also add Reglan, 5 mg, to be used in between if needed. We discussed the risks and benefits of Reglan.   We will discharge her home to follow with her primary physician, Fulton Reek.  Ahmed Prima, MD 06/16/15 [phone]

## 2015-06-17 LAB — URINE CULTURE

## 2015-07-02 DIAGNOSIS — E119 Type 2 diabetes mellitus without complications: Secondary | ICD-10-CM | POA: Insufficient documentation

## 2015-07-10 ENCOUNTER — Encounter: Payer: Self-pay | Admitting: Emergency Medicine

## 2015-07-10 ENCOUNTER — Emergency Department: Payer: Medicare Other

## 2015-07-10 ENCOUNTER — Observation Stay
Admission: EM | Admit: 2015-07-10 | Discharge: 2015-07-11 | Disposition: A | Discharge status: Home / Self Care | Payer: Medicare Other | Attending: Internal Medicine | Admitting: Internal Medicine

## 2015-07-10 DIAGNOSIS — Z9889 Other specified postprocedural states: Secondary | ICD-10-CM | POA: Diagnosis not present

## 2015-07-10 DIAGNOSIS — N183 Chronic kidney disease, stage 3 (moderate): Secondary | ICD-10-CM | POA: Diagnosis not present

## 2015-07-10 DIAGNOSIS — M160 Bilateral primary osteoarthritis of hip: Secondary | ICD-10-CM | POA: Insufficient documentation

## 2015-07-10 DIAGNOSIS — A084 Viral intestinal infection, unspecified: Principal | ICD-10-CM | POA: Insufficient documentation

## 2015-07-10 DIAGNOSIS — Z85831 Personal history of malignant neoplasm of soft tissue: Secondary | ICD-10-CM | POA: Insufficient documentation

## 2015-07-10 DIAGNOSIS — Z8619 Personal history of other infectious and parasitic diseases: Secondary | ICD-10-CM | POA: Insufficient documentation

## 2015-07-10 DIAGNOSIS — Z90710 Acquired absence of both cervix and uterus: Secondary | ICD-10-CM | POA: Insufficient documentation

## 2015-07-10 DIAGNOSIS — D638 Anemia in other chronic diseases classified elsewhere: Secondary | ICD-10-CM | POA: Insufficient documentation

## 2015-07-10 DIAGNOSIS — Z981 Arthrodesis status: Secondary | ICD-10-CM | POA: Diagnosis not present

## 2015-07-10 DIAGNOSIS — Z888 Allergy status to other drugs, medicaments and biological substances status: Secondary | ICD-10-CM | POA: Insufficient documentation

## 2015-07-10 DIAGNOSIS — Z9841 Cataract extraction status, right eye: Secondary | ICD-10-CM | POA: Insufficient documentation

## 2015-07-10 DIAGNOSIS — I129 Hypertensive chronic kidney disease with stage 1 through stage 4 chronic kidney disease, or unspecified chronic kidney disease: Secondary | ICD-10-CM | POA: Diagnosis not present

## 2015-07-10 DIAGNOSIS — K8020 Calculus of gallbladder without cholecystitis without obstruction: Secondary | ICD-10-CM | POA: Insufficient documentation

## 2015-07-10 DIAGNOSIS — I2510 Atherosclerotic heart disease of native coronary artery without angina pectoris: Secondary | ICD-10-CM | POA: Diagnosis not present

## 2015-07-10 DIAGNOSIS — Z7982 Long term (current) use of aspirin: Secondary | ICD-10-CM | POA: Diagnosis not present

## 2015-07-10 DIAGNOSIS — Z961 Presence of intraocular lens: Secondary | ICD-10-CM | POA: Insufficient documentation

## 2015-07-10 DIAGNOSIS — R197 Diarrhea, unspecified: Secondary | ICD-10-CM | POA: Diagnosis present

## 2015-07-10 DIAGNOSIS — M479 Spondylosis, unspecified: Secondary | ICD-10-CM | POA: Diagnosis not present

## 2015-07-10 DIAGNOSIS — Z955 Presence of coronary angioplasty implant and graft: Secondary | ICD-10-CM | POA: Insufficient documentation

## 2015-07-10 DIAGNOSIS — E785 Hyperlipidemia, unspecified: Secondary | ICD-10-CM | POA: Insufficient documentation

## 2015-07-10 DIAGNOSIS — Z91013 Allergy to seafood: Secondary | ICD-10-CM | POA: Insufficient documentation

## 2015-07-10 DIAGNOSIS — Z7984 Long term (current) use of oral hypoglycemic drugs: Secondary | ICD-10-CM | POA: Diagnosis not present

## 2015-07-10 DIAGNOSIS — R1110 Vomiting, unspecified: Secondary | ICD-10-CM | POA: Insufficient documentation

## 2015-07-10 DIAGNOSIS — Z9842 Cataract extraction status, left eye: Secondary | ICD-10-CM | POA: Insufficient documentation

## 2015-07-10 DIAGNOSIS — Z885 Allergy status to narcotic agent status: Secondary | ICD-10-CM | POA: Diagnosis not present

## 2015-07-10 DIAGNOSIS — E1122 Type 2 diabetes mellitus with diabetic chronic kidney disease: Secondary | ICD-10-CM | POA: Diagnosis not present

## 2015-07-10 DIAGNOSIS — E538 Deficiency of other specified B group vitamins: Secondary | ICD-10-CM | POA: Insufficient documentation

## 2015-07-10 LAB — CBC WITH DIFFERENTIAL/PLATELET
BASOS ABS: 0 10*3/uL (ref 0–0.1)
Basophils Relative: 0 %
EOS ABS: 0 10*3/uL (ref 0–0.7)
EOS PCT: 0 %
HCT: 32.3 % — ABNORMAL LOW (ref 35.0–47.0)
HEMOGLOBIN: 10.8 g/dL — AB (ref 12.0–16.0)
LYMPHS ABS: 0.4 10*3/uL — AB (ref 1.0–3.6)
LYMPHS PCT: 5 %
MCH: 30.4 pg (ref 26.0–34.0)
MCHC: 33.4 g/dL (ref 32.0–36.0)
MCV: 91 fL (ref 80.0–100.0)
Monocytes Absolute: 0.6 10*3/uL (ref 0.2–0.9)
Monocytes Relative: 7 %
NEUTROS PCT: 88 %
Neutro Abs: 7.9 10*3/uL — ABNORMAL HIGH (ref 1.4–6.5)
PLATELETS: 190 10*3/uL (ref 150–440)
RBC: 3.55 MIL/uL — AB (ref 3.80–5.20)
RDW: 14.3 % (ref 11.5–14.5)
WBC: 9 10*3/uL (ref 3.6–11.0)

## 2015-07-10 LAB — GASTROINTESTINAL PANEL BY PCR, STOOL (REPLACES STOOL CULTURE)
Adenovirus F40/41: NOT DETECTED
Astrovirus: NOT DETECTED
CAMPYLOBACTER SPECIES: NOT DETECTED
CRYPTOSPORIDIUM: NOT DETECTED
Cyclospora cayetanensis: NOT DETECTED
E. COLI O157: NOT DETECTED
ENTEROAGGREGATIVE E COLI (EAEC): NOT DETECTED
Entamoeba histolytica: NOT DETECTED
Enteropathogenic E coli (EPEC): NOT DETECTED
Enterotoxigenic E coli (ETEC): NOT DETECTED
GIARDIA LAMBLIA: NOT DETECTED
NOROVIRUS GI/GII: NOT DETECTED
PLESIMONAS SHIGELLOIDES: NOT DETECTED
ROTAVIRUS A: NOT DETECTED
SALMONELLA SPECIES: NOT DETECTED
SAPOVIRUS (I, II, IV, AND V): NOT DETECTED
SHIGA LIKE TOXIN PRODUCING E COLI (STEC): NOT DETECTED
SHIGELLA/ENTEROINVASIVE E COLI (EIEC): NOT DETECTED
Vibrio cholerae: NOT DETECTED
Vibrio species: NOT DETECTED
YERSINIA ENTEROCOLITICA: NOT DETECTED

## 2015-07-10 LAB — GLUCOSE, CAPILLARY
GLUCOSE-CAPILLARY: 119 mg/dL — AB (ref 65–99)
GLUCOSE-CAPILLARY: 205 mg/dL — AB (ref 65–99)
GLUCOSE-CAPILLARY: 125 mg/dL — AB (ref 65–99)

## 2015-07-10 LAB — COMPREHENSIVE METABOLIC PANEL
ALK PHOS: 106 U/L (ref 38–126)
ALT: 23 U/L (ref 14–54)
AST: 22 U/L (ref 15–41)
Albumin: 3.6 g/dL (ref 3.5–5.0)
Anion gap: 9 (ref 5–15)
BUN: 36 mg/dL — AB (ref 6–20)
CALCIUM: 8.1 mg/dL — AB (ref 8.9–10.3)
CHLORIDE: 111 mmol/L (ref 101–111)
CO2: 20 mmol/L — AB (ref 22–32)
CREATININE: 1.55 mg/dL — AB (ref 0.44–1.00)
GFR calc Af Amer: 39 mL/min — ABNORMAL LOW (ref >60)
GFR calc non Af Amer: 34 mL/min — ABNORMAL LOW (ref >60)
GLUCOSE: 174 mg/dL — AB (ref 65–99)
Potassium: 4.1 mmol/L (ref 3.5–5.1)
SODIUM: 140 mmol/L (ref 135–145)
Total Bilirubin: 0.6 mg/dL (ref 0.3–1.2)
Total Protein: 6.7 g/dL (ref 6.5–8.1)

## 2015-07-10 LAB — C DIFFICILE QUICK SCREEN W PCR REFLEX
C DIFFICILE (CDIFF) TOXIN: NEGATIVE
C Diff antigen: NEGATIVE
C Diff interpretation: NEGATIVE

## 2015-07-10 LAB — LIPASE, BLOOD: Lipase: 46 U/L (ref 11–51)

## 2015-07-10 MED ORDER — AMLODIPINE BESYLATE 5 MG PO TABS
5.0000 mg | ORAL_TABLET | Freq: Every day | ORAL | Status: DC
  Administered 2015-07-10 – 2015-07-11 (×2): 5 mg via ORAL
  Filled 2015-07-10 (×2): qty 1

## 2015-07-10 MED ORDER — ATORVASTATIN CALCIUM 20 MG PO TABS
40.0000 mg | ORAL_TABLET | Freq: Every day | ORAL | Status: DC
  Administered 2015-07-10: 40 mg via ORAL
  Filled 2015-07-10: qty 2

## 2015-07-10 MED ORDER — CALCIUM CARBONATE-VITAMIN D 500-200 MG-UNIT PO TABS
1.0000 | ORAL_TABLET | Freq: Every day | ORAL | Status: DC
  Administered 2015-07-10 – 2015-07-11 (×2): 1 via ORAL
  Filled 2015-07-10 (×3): qty 1

## 2015-07-10 MED ORDER — INSULIN ASPART 100 UNIT/ML ~~LOC~~ SOLN: 0.0000 [IU] | Freq: Every day | SUBCUTANEOUS | Status: DC

## 2015-07-10 MED ORDER — LOSARTAN POTASSIUM 50 MG PO TABS
100.0000 mg | ORAL_TABLET | Freq: Every day | ORAL | Status: DC
Start: 2015-07-10 — End: 2015-07-11
  Administered 2015-07-10 – 2015-07-11 (×2): 100 mg via ORAL
  Filled 2015-07-10 (×2): qty 2

## 2015-07-10 MED ORDER — BIOTIN 5000 MCG PO TABS: 5000.0000 ug | ORAL_TABLET | Freq: Every day | ORAL | Status: DC

## 2015-07-10 MED ORDER — VITAMIN B-12 1000 MCG PO TABS
1000.0000 ug | ORAL_TABLET | Freq: Every day | ORAL | Status: DC
  Administered 2015-07-10 – 2015-07-11 (×2): 1000 ug via ORAL
  Filled 2015-07-10 (×2): qty 2
  Filled 2015-07-10: qty 1

## 2015-07-10 MED ORDER — SODIUM CHLORIDE 0.9 % IV BOLUS (SEPSIS)
1000.0000 mL | Freq: Once | INTRAVENOUS | Status: AC
  Administered 2015-07-10: 1000 mL via INTRAVENOUS

## 2015-07-10 MED ORDER — ALUM & MAG HYDROXIDE-SIMETH 200-200-20 MG/5ML PO SUSP: 30.0000 mL | Freq: Four times a day (QID) | ORAL | Status: DC | PRN

## 2015-07-10 MED ORDER — FERROUS SULFATE 325 (65 FE) MG PO TABS
325.0000 mg | ORAL_TABLET | Freq: Every day | ORAL | Status: DC
  Administered 2015-07-10 – 2015-07-11 (×2): 325 mg via ORAL
  Filled 2015-07-10 (×2): qty 1

## 2015-07-10 MED ORDER — ASPIRIN EC 81 MG PO TBEC
81.0000 mg | DELAYED_RELEASE_TABLET | Freq: Every day | ORAL | Status: DC
  Administered 2015-07-10: 81 mg via ORAL
  Filled 2015-07-10: qty 1

## 2015-07-10 MED ORDER — LOPERAMIDE HCL 2 MG PO CAPS: 2.0000 mg | ORAL_CAPSULE | Freq: Four times a day (QID) | ORAL | Status: DC | PRN

## 2015-07-10 MED ORDER — ENOXAPARIN SODIUM 40 MG/0.4ML ~~LOC~~ SOLN
40.0000 mg | SUBCUTANEOUS | Status: DC
  Filled 2015-07-10: qty 0.4

## 2015-07-10 MED ORDER — ONDANSETRON HCL 4 MG/2ML IJ SOLN
4.0000 mg | Freq: Once | INTRAMUSCULAR | Status: AC
  Administered 2015-07-10: 4 mg via INTRAVENOUS
  Filled 2015-07-10: qty 2

## 2015-07-10 MED ORDER — INSULIN ASPART 100 UNIT/ML ~~LOC~~ SOLN
0.0000 [IU] | Freq: Three times a day (TID) | SUBCUTANEOUS | Status: DC
  Administered 2015-07-10 – 2015-07-11 (×2): 3 [IU] via SUBCUTANEOUS
  Filled 2015-07-10 (×2): qty 3

## 2015-07-10 MED ORDER — IRON 325 (65 FE) MG PO TABS: 1.0000 | ORAL_TABLET | Freq: Every day | ORAL | Status: DC

## 2015-07-10 MED ORDER — EZETIMIBE 10 MG PO TABS
10.0000 mg | ORAL_TABLET | Freq: Every day | ORAL | Status: DC
  Administered 2015-07-10: 10 mg via ORAL
  Filled 2015-07-10 (×2): qty 1

## 2015-07-10 MED ORDER — SENNOSIDES-DOCUSATE SODIUM 8.6-50 MG PO TABS: 1.0000 | ORAL_TABLET | Freq: Every evening | ORAL | Status: DC | PRN

## 2015-07-10 MED ORDER — CYANOCOBALAMIN ER 1000 MCG PO TBCR: 1.0000 | EXTENDED_RELEASE_TABLET | Freq: Every day | ORAL | Status: DC

## 2015-07-10 MED ORDER — SODIUM CHLORIDE 0.9% FLUSH
3.0000 mL | Freq: Two times a day (BID) | INTRAVENOUS | Status: DC
  Administered 2015-07-10: 3 mL via INTRAVENOUS

## 2015-07-10 MED ORDER — ACETAMINOPHEN 325 MG PO TABS: 650.0000 mg | ORAL_TABLET | Freq: Four times a day (QID) | ORAL | Status: DC | PRN

## 2015-07-10 MED ORDER — ONDANSETRON HCL 4 MG PO TABS: 4.0000 mg | ORAL_TABLET | Freq: Four times a day (QID) | ORAL | Status: DC | PRN

## 2015-07-10 MED ORDER — MECLIZINE HCL 25 MG PO TABS
25.0000 mg | ORAL_TABLET | Freq: Three times a day (TID) | ORAL | Status: DC | PRN
  Filled 2015-07-10: qty 1

## 2015-07-10 MED ORDER — SODIUM CHLORIDE 0.9 % IV SOLN
INTRAVENOUS | Status: DC
  Administered 2015-07-10 (×2): via INTRAVENOUS

## 2015-07-10 MED ORDER — METOCLOPRAMIDE HCL 5 MG PO TABS
5.0000 mg | ORAL_TABLET | Freq: Three times a day (TID) | ORAL | Status: DC
  Administered 2015-07-10 – 2015-07-11 (×4): 5 mg via ORAL
  Filled 2015-07-10 (×4): qty 1

## 2015-07-10 MED ORDER — ONDANSETRON HCL 4 MG/2ML IJ SOLN: 4.0000 mg | Freq: Four times a day (QID) | INTRAMUSCULAR | Status: DC | PRN

## 2015-07-10 NOTE — ED Notes (Signed)
Patient transported to Ultrasound 

## 2015-07-10 NOTE — H&P (Signed)
[facility] [place] NAME: Dorothy Rogers    MR#:  [id number]  DATE OF BIRTH:  July 20, 1950  DATE OF ADMISSION:  07/10/2015  PRIMARY CARE PHYSICIAN: SPARKS,JEFFREY D, MD   REQUESTING/REFERRING PHYSICIAN: Dr Lorayne Bender  CHIEF COMPLAINT:  diarrhea  HISTORY OF PRESENT ILLNESS:  Dorothy Rogers  is a 65 y.o. female with a known history of C. difficile colitis and diabetes who presents with above complaint. Patient reports since this morning she had 6 loose bowel movements. She was worried that she is C. difficile colitis. C. difficile colitis testing was negative in the emergency room. She continues to have loose watery stools without blood. She denies sick contacts.  PAST MEDICAL HISTORY:   Past Medical History  Diagnosis Date  . Blood transfusion without reported diagnosis   . Cataract   . Diabetes mellitus without complication ([place])   . Hyperlipidemia   . Hypertension   . Herpes simplex   . Coronary artery disease   . Anemia   . Vitamin B 12 deficiency   . Cancer ([place])     tumor back of neck-fibroushistocytoma  . Arthritis     lower back, hips  . Chronic kidney disease     Stage III  . Vertigo     occasional, no episodes in 2-3 months    PAST SURGICAL HISTORY:   Past Surgical History  Procedure Laterality Date  . Cataract extraction Left   . Coronary stent placement  03/2008  . Cardiac catheterization  2009  . Eye surgery Left     cataract extraction  . Abdominal hysterectomy    . Spine surgery      2001, 2014, 2015  . Lumbar fusion  2001, 2014, 2015  . Cystectomy Right     wrist  . Tumor excision      back of neck  . Cataract extraction w/phaco Right 03/19/2015    Procedure: CATARACT EXTRACTION PHACO AND INTRAOCULAR LENS PLACEMENT (IOC);  Surgeon: Leandrew Koyanagi, MD;  Location: [facility];  Service: Ophthalmology;  Laterality: Right;  DIABETIC - insulin and oral meds  . Back surgery      Lumbar spinal  fusion 05/30/2015    SOCIAL HISTORY:   Social History  Substance Use Topics  . Smoking status: Never Smoker   . Smokeless tobacco: Not on file  . Alcohol Use: 0.0 oz/week    0 Standard drinks or equivalent per week     Comment: 2-3 drinks per year    FAMILY HISTORY:   Family History  Problem Relation Age of Onset  . Heart disease Father     DRUG ALLERGIES:   Allergies  Allergen Reactions  . Beta Adrenergic Blockers Hives  . Celebrex [Celecoxib] Other (See Comments)    A lot of pressure in head  . Niacin And Related Other (See Comments)    Flushing and hot sensation  . Shellfish Allergy Nausea And Vomiting  . Vicodin [Hydrocodone-Acetaminophen] Other (See Comments)    Hypoglycemic episodes    REVIEW OF SYSTEMS:  CONSTITUTIONAL: No fever, fatigue or weakness.  EYES: No blurred or double vision.  EARS, NOSE, AND THROAT: No tinnitus or ear pain.  RESPIRATORY: No cough, shortness of breath, wheezing or hemoptysis.  CARDIOVASCULAR: No chest pain, orthopnea, edema.  GASTROINTESTINAL: Positive for nausea, vomiting and diarrhea.   GENITOURINARY: No dysuria, hematuria.  ENDOCRINE: No polyuria, nocturia,  HEMATOLOGY: No anemia, easy bruising or bleeding SKIN: No rash or lesion. MUSCULOSKELETAL: No  joint pain or arthritis.   NEUROLOGIC: No tingling, numbness, weakness.  PSYCHIATRY: No anxiety or depression.   MEDICATIONS AT HOME:   Prior to Admission medications   Medication Sig Start Date End Date Taking? Authorizing Provider  acetaminophen (TYLENOL) 325 MG tablet Take 650 mg by mouth every 6 (six) hours as needed for moderate pain, fever or headache.    Yes Historical Provider, MD  amLODipine (NORVASC) 5 MG tablet Take 5 mg by mouth daily.  08/29/14  Yes Historical Provider, MD  aspirin EC 81 MG tablet Take 81 mg by mouth at bedtime.   Yes Historical Provider, MD  atorvastatin (LIPITOR) 40 MG tablet Take 40 mg by mouth at bedtime.  08/16/14  Yes Historical Provider, MD   Biotin 5000 MCG TABS Take 5,000 mcg by mouth daily.    Yes Historical Provider, MD  Calcium Carbonate-Vitamin D 600-400 MG-UNIT per tablet Take 1 tablet by mouth daily.    Yes Historical Provider, MD  Cyanocobalamin (RA VITAMIN B-12 TR) 1000 MCG TBCR Take 1 tablet by mouth daily.    Yes Historical Provider, MD  ezetimibe (ZETIA) 10 MG tablet Take 10 mg by mouth at bedtime.  11/26/13  Yes Historical Provider, MD  Ferrous Sulfate (IRON) 325 (65 FE) MG TABS Take 1 tablet by mouth daily.    Yes Historical Provider, MD  glipiZIDE (GLUCOTROL) 10 MG tablet Take 1 tablet by mouth 2 (two) times daily. 07/04/15  Yes Historical Provider, MD  losartan (COZAAR) 100 MG tablet Take 100 mg by mouth daily.  02/05/14  Yes Historical Provider, MD  meclizine (ANTIVERT) 25 MG tablet Take 25 mg by mouth 3 (three) times daily as needed for dizziness.   Yes Historical Provider, MD  metoCLOPramide (REGLAN) 5 MG tablet Take 1 tablet (5 mg total) by mouth 3 (three) times daily. 06/16/15  Yes Ahmed Prima, MD      VITAL SIGNS:  Blood pressure 133/53, pulse 91, temperature 98.5 F (36.9 C), temperature source Oral, resp. rate 18, height 5' 2.5" (1.588 m), weight 81.194 kg (179 lb), SpO2 100 %.  PHYSICAL EXAMINATION:  GENERAL:  65 y.o.-year-old patient lying in the bed with no acute distress.  EYES: Pupils equal, round, reactive to light and accommodation. No scleral icterus. Extraocular muscles intact.  HEENT: Head atraumatic, normocephalic. Oropharynx and nasopharynx clear.  NECK:  Supple, no jugular venous distention. No thyroid enlargement, no tenderness.  LUNGS: Normal breath sounds bilaterally, no wheezing, rales,rhonchi or crepitation. No use of accessory muscles of respiration.  CARDIOVASCULAR: S1, S2 normal. No murmurs, rubs, or gallops.  ABDOMEN: Soft, nontender, nondistended. Bowel sounds present. No organomegaly or mass.  EXTREMITIES: No pedal edema, cyanosis, or clubbing.  NEUROLOGIC: Cranial nerves II  through XII are grossly intact. No focal deficits. PSYCHIATRIC: The patient is alert and oriented x 3.  SKIN: No obvious rash, lesion, or ulcer.   LABORATORY PANEL:   CBC  Recent Labs Lab 07/10/15 0312  WBC 9.0  HGB 10.8*  HCT 32.3*  PLT 190   ------------------------------------------------------------------------------------------------------------------  Chemistries   Recent Labs Lab 07/10/15 0312  NA 140  K 4.1  CL 111  CO2 20*  GLUCOSE 174*  BUN 36*  CREATININE 1.55*  CALCIUM 8.1*  AST 22  ALT 23  ALKPHOS 106  BILITOT 0.6   ------------------------------------------------------------------------------------------------------------------  Cardiac Enzymes No results for input(s): TROPONINI in the last 168 hours. ------------------------------------------------------------------------------------------------------------------  RADIOLOGY:  US Abdomen Limited Ruq  07/10/2015  CLINICAL DATA:  Acute onset of vomiting.  Initial encounter. EXAM: US ABDOMEN LIMITED - RIGHT UPPER QUADRANT COMPARISON:  CT of the abdomen and pelvis from 06/15/2015 FINDINGS: Gallbladder: Mobile gallstones are seen, measuring up to 1.9 cm in size. No gallbladder wall thickening or pericholecystic fluid is seen. No ultrasonographic Murphy's sign is elicited. Common bile duct: Diameter: 0.4 cm, within normal limits in caliber. Liver: No focal lesion identified. Within normal limits in parenchymal echogenicity. IMPRESSION: 1. No acute abnormality seen at the right upper quadrant. 2. Cholelithiasis noted.  Gallbladder otherwise unremarkable. Electronically Signed   By: Garald Balding M.D.   On: 07/10/2015 05:09    EKG:     IMPRESSION AND PLAN:   65 year old female with diabetes and history of C. difficile who presents with diarrhea.  1. Diarrhea: C. difficile testing was negative. This is likely viral in etiology. Continue supportive care. Order stool culture. Continue IV fluids.  2.  Diabetes: Continue sliding scale insulin and ADA diet. Into new Reglan. 3. Essential hypertension: Continue Norvasc and losartan.  4. Anemia of chronic disease: Continue ferrous sulfate    All the records are reviewed and case discussed with ED provider. Management plans discussed with the patient and she is in agreement.  CODE STATUS: FULL  TOTAL TIME TAKING CARE OF THIS PATIENT: 45 minutes.    Shoni Quijas M.D on 07/10/2015 at 12:05 PM  Between 7am to 6pm - Pager - [phone] After 6pm go to [email or web address] - password [email or web address] [facility]  Office  [phone]  CC: Primary care physician; Idelle Crouch, MD

## 2015-07-10 NOTE — ED Notes (Signed)
EMS given 1L normal saline and 4mg  zofran.

## 2015-07-10 NOTE — ED Notes (Signed)
In pts room, apologized for not being able to be in room sooner.(sick pt). Pt a/o, ok with wait. Vss. Denies pain. Told pt and I am calling report now,

## 2015-07-10 NOTE — ED Provider Notes (Signed)
[facility] Emergency Department Provider Note  ____________________________________________  Time seen: 3:30 AM  I have reviewed the triage vital signs and the nursing notes.   HISTORY  Chief Complaint Emesis      HPI Dorothy Rogers is a 65 y.o. female with history of recent diagnosis of cholelithiasis and C. difficile which she recently finished therapy with Flagyl. presents with acute onset of vomiting and diarrhea since midnight tonight. Patient also admits to one episode of diarrhea before leaving her home. Patient denies any abdominal pain. Patient denies any fever       Past Medical History  Diagnosis Date  . Blood transfusion without reported diagnosis   . Cataract   . Diabetes mellitus without complication ([place])   . Hyperlipidemia   . Hypertension   . Herpes simplex   . Coronary artery disease   . Anemia   . Vitamin B 12 deficiency   . Cancer ([place])     tumor back of neck-fibroushistocytoma  . Arthritis     lower back, hips  . Chronic kidney disease     Stage III  . Vertigo     occasional, no episodes in 2-3 months    Patient Active Problem List   Diagnosis Date Noted  . Adjustment disorder 06/16/2015  . Abdominal pain 06/16/2015  . C. difficile colitis 06/16/2015  . Intractable nausea and vomiting 06/09/2015  . Dehydration 06/09/2015  . Type 2 diabetes mellitus ([place]) 06/09/2015  . HLD (hyperlipidemia) 06/09/2015  . HTN (hypertension) 06/09/2015  . CAD (coronary artery disease) 06/09/2015  . CKD (chronic kidney disease), stage III 06/09/2015  . 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 Status post lumbar spinal fusion 12/25/2014  . DDD (degenerative disc disease), lumbar 11/18/2014  . Lumbar post-laminectomy syndrome 11/18/2014  . Facet syndrome, lumbar 11/18/2014  . Piriformis syndrome of left side 11/18/2014    Past Surgical History  Procedure Laterality Date  . Cataract extraction Left   . Coronary stent  placement  03/2008  . Cardiac catheterization  2009  . Eye surgery Left     cataract extraction  . Abdominal hysterectomy    . Spine surgery      2001, 2014, 2015  . Lumbar fusion  2001, 2014, 2015  . Cystectomy Right     wrist  . Tumor excision      back of neck  . Cataract extraction w/phaco Right 03/19/2015    Procedure: CATARACT EXTRACTION PHACO AND INTRAOCULAR LENS PLACEMENT (IOC);  Surgeon: Leandrew Koyanagi, MD;  Location: [facility];  Service: Ophthalmology;  Laterality: Right;  DIABETIC - insulin and oral meds  . Back surgery      Lumbar spinal fusion 05/30/2015    Current Outpatient Rx  Name  Route  Sig  Dispense  Refill  . acetaminophen (TYLENOL) 325 MG tablet   Oral   Take 650 mg by mouth every 6 (six) hours as needed for moderate pain, fever or headache.          Marland Kitchen acetaminophen-codeine (TYLENOL #3) 300-30 MG tablet   Oral   Take 1 tablet by mouth every 4 (four) hours as needed for moderate pain.         Marland Kitchen acyclovir (ZOVIRAX) 400 MG tablet   Oral   Take 400 mg by mouth 2 (two) times daily as needed (fever blisters).         Marland Kitchen amLODipine (NORVASC) 5 MG tablet   Oral   Take 5 mg by mouth daily.          Marland Kitchen  aspirin EC 81 MG tablet   Oral   Take 81 mg by mouth at bedtime.         Marland Kitchen atorvastatin (LIPITOR) 40 MG tablet   Oral   Take 40 mg by mouth at bedtime.          . Biotin 5000 MCG TABS   Oral   Take 5,000 mcg by mouth daily.          . Calcium Carbonate-Vitamin D 600-400 MG-UNIT per tablet   Oral   Take 1 tablet by mouth daily.          . clopidogrel (PLAVIX) 75 MG tablet   Oral   Take 75 mg by mouth daily. Medication currently on hold until stitches are removed         . Cyanocobalamin (RA VITAMIN B-12 TR) 1000 MCG TBCR   Oral   Take 1 tablet by mouth daily.          Marland Kitchen ezetimibe (ZETIA) 10 MG tablet   Oral   Take 10 mg by mouth at bedtime.          . Ferrous Sulfate (IRON) 325 (65 FE) MG TABS   Oral   Take  1 tablet by mouth daily.          Marland Kitchen GLUCERNA (GLUCERNA) LIQD   Oral   Take 1 Can by mouth 2 (two) times daily between meals.   30 Can   0   . Insulin Glargine (LANTUS SOLOSTAR) 100 UNIT/ML Solostar Pen   Subcutaneous   Inject 14 Units into the skin daily. Inject 14 units until you are able to tolerate normal diet. Continue to   15 mL   11   . losartan (COZAAR) 100 MG tablet   Oral   Take 100 mg by mouth daily.          . meclizine (ANTIVERT) 25 MG tablet   Oral   Take 25 mg by mouth 3 (three) times daily as needed for dizziness.         . metoCLOPramide (REGLAN) 5 MG tablet   Oral   Take 1 tablet (5 mg total) by mouth 3 (three) times daily.   15 tablet   0   . metroNIDAZOLE (FLAGYL) 500 MG tablet   Oral   Take 1 tablet (500 mg total) by mouth 3 (three) times daily.   42 tablet   0   . ondansetron (ZOFRAN) 4 MG tablet   Oral   Take 1 tablet (4 mg total) by mouth every 8 (eight) hours as needed for nausea or vomiting.   20 tablet   0     Allergies Beta adrenergic blockers; Celebrex; Niacin and related; Shellfish allergy; and Vicodin  Family History  Problem Relation Age of Onset  . Heart disease Father     Social History Social History  Substance Use Topics  . Smoking status: Never Smoker   . Smokeless tobacco: None  . Alcohol Use: 0.0 oz/week    0 Standard drinks or equivalent per week     Comment: 2-3 drinks per year    Review of Systems  Constitutional: Negative for fever. Eyes: Negative for visual changes. ENT: Negative for sore throat. Cardiovascular: Negative for chest pain. Respiratory: Negative for shortness of breath. Gastrointestinal: Positive for abdominal pain and vomiting and diarrhea. Genitourinary: Negative for dysuria. Musculoskeletal: Negative for back pain. Skin: Negative for rash. Neurological: Negative for headaches, focal weakness or numbness.   10-point ROS otherwise  negative.  ____________________________________________   PHYSICAL EXAM:  VITAL SIGNS: ED Triage Vitals  Enc Vitals Group     BP 07/10/15 0255 123/65 mmHg     Pulse Rate 07/10/15 0255 128     Resp 07/10/15 0255 13     Temp 07/10/15 0255 99.6 F (37.6 C)     Temp Source 07/10/15 0255 Oral     SpO2 07/10/15 0255 100 %     Weight 07/10/15 0259 179 lb (81.194 kg)     Height 07/10/15 0259 5' 2.5" (1.588 m)     Head Cir --      Peak Flow --      Pain Score 07/10/15 0256 0     Pain Loc --      Pain Edu? --      Excl. in [place]? --      Constitutional: Alert and oriented. Well appearing and in no distress. Eyes: Conjunctivae are normal. PERRL. Normal extraocular movements. ENT   Head: Normocephalic and atraumatic.   Nose: No congestion/rhinnorhea.   Mouth/Throat: Mucous membranes are moist.   Neck: No stridor. Hematological/Lymphatic/Immunilogical: No cervical lymphadenopathy. Cardiovascular: Normal rate, regular rhythm. Normal and symmetric distal pulses are present in all extremities. No murmurs, rubs, or gallops. Respiratory: Normal respiratory effort without tachypnea nor retractions. Breath sounds are clear and equal bilaterally. No wheezes/rales/rhonchi. Gastrointestinal: Right upper quadrant tenderness to palpation. No distention. There is no CVA tenderness. Genitourinary: deferred Musculoskeletal: Nontender with normal range of motion in all extremities. No joint effusions.  No lower extremity tenderness nor edema. Neurologic:  Normal speech and language. No gross focal neurologic deficits are appreciated. Speech is normal.  Skin:  Skin is warm, dry and intact. No rash noted. Psychiatric: Mood and affect are normal. Speech and behavior are normal. Patient exhibits appropriate insight and judgment.  ____________________________________________    LABS (pertinent positives/negatives)  Labs Reviewed  CBC WITH DIFFERENTIAL/PLATELET - Abnormal; Notable for the  following:    RBC 3.55 (*)    Hemoglobin 10.8 (*)    HCT 32.3 (*)    Neutro Abs 7.9 (*)    Lymphs Abs 0.4 (*)    All other components within normal limits  COMPREHENSIVE METABOLIC PANEL - Abnormal; Notable for the following:    CO2 20 (*)    Glucose, Bld 174 (*)    BUN 36 (*)    Creatinine, Ser 1.55 (*)    Calcium 8.1 (*)    GFR calc non Af Amer 34 (*)    GFR calc Af Amer 39 (*)    All other components within normal limits  C DIFFICILE QUICK SCREEN W PCR REFLEX  LIPASE, BLOOD     ____________________________________________   EKG  ED ECG REPORT I, BROWN, [place] N, the attending physician, personally viewed and interpreted this ECG.   Date: 07/11/2015  EKG Time: 2:55 AM  Rate: 125  Rhythm: Sinus tachycardia  Axis: normal  Intervals:normal  ST&T Change: none   ____________________________________________    RADIOLOGY     US Abdomen Limited RUQ (Final result) Result time: 07/10/15 05:09:43   Final result by Rad Results In Interface (07/10/15 05:09:43)   Narrative:   CLINICAL DATA: Acute onset of vomiting. Initial encounter.  EXAM: US ABDOMEN LIMITED - RIGHT UPPER QUADRANT  COMPARISON: CT of the abdomen and pelvis from 06/15/2015  FINDINGS: Gallbladder:  Mobile gallstones are seen, measuring up to 1.9 cm in size. No gallbladder wall thickening or pericholecystic fluid is seen. No ultrasonographic Murphy's sign is elicited.  Common bile duct:  Diameter: 0.4 cm, within normal limits in caliber.  Liver:  No focal lesion identified. Within normal limits in parenchymal echogenicity.  IMPRESSION: 1. No acute abnormality seen at the right upper quadrant. 2. Cholelithiasis noted. Gallbladder otherwise unremarkable.   Electronically Signed By: Garald Balding M.D. On: 07/10/2015 05:09          INITIAL IMPRESSION / ASSESSMENT AND PLAN / ED COURSE  Pertinent labs & imaging results that were available during my care of the patient  were reviewed by me and considered in my medical decision making (see chart for details).  Patient discussed with Dr Marcille Blanco for hospital admission pending C. difficile results.  ____________________________________________   FINAL CLINICAL IMPRESSION(S) / ED DIAGNOSES  Final diagnoses:  Vomiting  Diarrhea    Gregor Hams, MD 07/11/15 [phone]

## 2015-07-10 NOTE — Care Management Obs Status (Signed)
[facility] NOTIFICATION   Patient Details  Name: Dorothy Rogers MRN: [id number] Date of Birth: 28-Jul-1950   Medicare Observation Status Notification Given:  Yes    Beau Fanny, RN 07/10/2015, 9:36 AM

## 2015-07-10 NOTE — ED Notes (Addendum)
Pt presents to ED via EMS with c/o sudden onset of nausea and vomiting after eating dinner. Pt states no one else in  Her home is vomiting and the only thing she ate by herself was bean. Pt reports one episode of diarrhea prior to leaving the house. Denies pain. Pt alerts and oriented x4 at this time, airway intact. Per EMS HR-140-150.

## 2015-07-11 DIAGNOSIS — A084 Viral intestinal infection, unspecified: Secondary | ICD-10-CM | POA: Diagnosis not present

## 2015-07-11 LAB — BASIC METABOLIC PANEL
Anion gap: 5 (ref 5–15)
BUN: 24 mg/dL — AB (ref 6–20)
CHLORIDE: 117 mmol/L — AB (ref 101–111)
CO2: 22 mmol/L (ref 22–32)
CREATININE: 1.32 mg/dL — AB (ref 0.44–1.00)
Calcium: 8.2 mg/dL — ABNORMAL LOW (ref 8.9–10.3)
GFR calc Af Amer: 48 mL/min — ABNORMAL LOW (ref >60)
GFR calc non Af Amer: 41 mL/min — ABNORMAL LOW (ref >60)
GLUCOSE: 85 mg/dL (ref 65–99)
Potassium: 3.9 mmol/L (ref 3.5–5.1)
SODIUM: 144 mmol/L (ref 135–145)

## 2015-07-11 LAB — GLUCOSE, CAPILLARY
GLUCOSE-CAPILLARY: 88 mg/dL (ref 65–99)
Glucose-Capillary: 204 mg/dL — ABNORMAL HIGH (ref 65–99)

## 2015-07-11 MED ORDER — CLOPIDOGREL BISULFATE 75 MG PO TABS: 75.0000 mg | ORAL_TABLET | Freq: Every day | ORAL | Status: DC

## 2015-07-11 MED ORDER — CLOPIDOGREL BISULFATE 75 MG PO TABS
75.0000 mg | ORAL_TABLET | Freq: Every day | ORAL | Status: DC
  Administered 2015-07-11: 75 mg via ORAL
  Filled 2015-07-11: qty 1

## 2015-07-11 NOTE — Discharge Summary (Signed)
[facility] NAME: Dorothy Rogers    MR#:  [id number]  DATE OF BIRTH:  1951-05-28  DATE OF ADMISSION:  07/10/2015 ADMITTING PHYSICIAN: Bettey Costa, MD  DATE OF DISCHARGE:07/11/2015  PRIMARY CARE PHYSICIAN: SPARKS,JEFFREY D, MD    ADMISSION DIAGNOSIS:  Vomiting [R11.10]  DISCHARGE DIAGNOSIS:  Active Problems:   Diarrhea   SECONDARY DIAGNOSIS:   Past Medical History  Diagnosis Date  . Blood transfusion without reported diagnosis   . Cataract   . Diabetes mellitus without complication ([place])   . Hyperlipidemia   . Hypertension   . Herpes simplex   . Coronary artery disease   . Anemia   . Vitamin B 12 deficiency   . Cancer ([place])     tumor back of neck-fibroushistocytoma  . Arthritis     lower back, hips  . Chronic kidney disease     Stage III  . Vertigo     occasional, no episodes in 2-3 months    HOSPITAL COURSE:    65 year old female with diabetes and history of C. difficile who presents with diarrhea.  1. Diarrhea: C. difficile testing was negative.  Diarrhea is viral in nature.  Area has resolved   2. Diabetes: Continue outpatient regimen and ADA diet.  3. Essential hypertension: Continue Norvasc and losartan at discharge  4. Anemia of chronic disease: Continue ferrous sulfate   DISCHARGE CONDITIONS AND DIET:   Table for discharge on a diabetic diet  CONSULTS OBTAINED:     DRUG ALLERGIES:   Allergies  Allergen Reactions  . Beta Adrenergic Blockers Hives  . Celebrex [Celecoxib] Other (See Comments)    A lot of pressure in head  . Niacin And Related Other (See Comments)    Flushing and hot sensation  . Shellfish Allergy Nausea And Vomiting  . Vicodin [Hydrocodone-Acetaminophen] Other (See Comments)    Hypoglycemic episodes    DISCHARGE MEDICATIONS:   Current Discharge Medication List    START taking these medications   Details  clopidogrel (PLAVIX) 75 MG tablet Take 1 tablet (75  mg total) by mouth daily. Qty: 30 tablet, Refills: 0      CONTINUE these medications which have NOT CHANGED   Details  acetaminophen (TYLENOL) 325 MG tablet Take 650 mg by mouth every 6 (six) hours as needed for moderate pain, fever or headache.     amLODipine (NORVASC) 5 MG tablet Take 5 mg by mouth daily.     aspirin EC 81 MG tablet Take 81 mg by mouth at bedtime.    atorvastatin (LIPITOR) 40 MG tablet Take 40 mg by mouth at bedtime.     Biotin 5000 MCG TABS Take 5,000 mcg by mouth daily.     Calcium Carbonate-Vitamin D 600-400 MG-UNIT per tablet Take 1 tablet by mouth daily.     Cyanocobalamin (RA VITAMIN B-12 TR) 1000 MCG TBCR Take 1 tablet by mouth daily.     ezetimibe (ZETIA) 10 MG tablet Take 10 mg by mouth at bedtime.     Ferrous Sulfate (IRON) 325 (65 FE) MG TABS Take 1 tablet by mouth daily.     glipiZIDE (GLUCOTROL) 10 MG tablet Take 1 tablet by mouth 2 (two) times daily. Refills: 0    losartan (COZAAR) 100 MG tablet Take 100 mg by mouth daily.     meclizine (ANTIVERT) 25 MG tablet Take 25 mg by mouth 3 (three) times daily as needed for dizziness.    metoCLOPramide (REGLAN) 5 MG  tablet Take 1 tablet (5 mg total) by mouth 3 (three) times daily. Qty: 15 tablet, Refills: 0              Today   CHIEF COMPLAINT:  Patient doing well. Patient reports no diarrhea, nausea or vomiting. She tolerated her diet well.   VITAL SIGNS:  Blood pressure 119/51, pulse 70, temperature 98.6 F (37 C), temperature source Oral, resp. rate 16, height 5\' 2"  (1.575 m), weight 83.87 kg (184 lb 14.4 oz), SpO2 98 %.   REVIEW OF SYSTEMS:  Review of Systems  Constitutional: Negative for fever, chills and malaise/fatigue.  HENT: Negative for sore throat.   Eyes: Negative for blurred vision.  Respiratory: Negative for cough, hemoptysis, shortness of breath and wheezing.   Cardiovascular: Negative for chest pain, palpitations and leg swelling.  Gastrointestinal: Negative for  nausea, vomiting, abdominal pain, diarrhea and blood in stool.  Genitourinary: Negative for dysuria.  Musculoskeletal: Negative for back pain.  Neurological: Negative for dizziness, tremors and headaches.  Endo/Heme/Allergies: Does not bruise/bleed easily.     PHYSICAL EXAMINATION:  GENERAL:  65 y.o.-year-old patient lying in the bed with no acute distress.  NECK:  Supple, no jugular venous distention. No thyroid enlargement, no tenderness.  LUNGS: Normal breath sounds bilaterally, no wheezing, rales,rhonchi  No use of accessory muscles of respiration.  CARDIOVASCULAR: S1, S2 normal. No murmurs, rubs, or gallops.  ABDOMEN: Soft, non-tender, non-distended. Bowel sounds present. No organomegaly or mass.  EXTREMITIES: No pedal edema, cyanosis, or clubbing.  PSYCHIATRIC: The patient is alert and oriented x 3.  SKIN: No obvious rash, lesion, or ulcer.   DATA REVIEW:   CBC  Recent Labs Lab 07/10/15 0312  WBC 9.0  HGB 10.8*  HCT 32.3*  PLT 190    Chemistries   Recent Labs Lab 07/10/15 0312 07/11/15 0420  NA 140 144  K 4.1 3.9  CL 111 117*  CO2 20* 22  GLUCOSE 174* 85  BUN 36* 24*  CREATININE 1.55* 1.32*  CALCIUM 8.1* 8.2*  AST 22  --   ALT 23  --   ALKPHOS 106  --   BILITOT 0.6  --     Cardiac Enzymes No results for input(s): TROPONINI in the last 168 hours.  Microbiology Results  @[email or web address] @  RADIOLOGY:  US Abdomen Limited Ruq  07/10/2015  CLINICAL DATA:  Acute onset of vomiting.  Initial encounter. EXAM: US ABDOMEN LIMITED - RIGHT UPPER QUADRANT COMPARISON:  CT of the abdomen and pelvis from 06/15/2015 FINDINGS: Gallbladder: Mobile gallstones are seen, measuring up to 1.9 cm in size. No gallbladder wall thickening or pericholecystic fluid is seen. No ultrasonographic Murphy's sign is elicited. Common bile duct: Diameter: 0.4 cm, within normal limits in caliber. Liver: No focal lesion identified. Within normal limits in parenchymal echogenicity. IMPRESSION: 1. No  acute abnormality seen at the right upper quadrant. 2. Cholelithiasis noted.  Gallbladder otherwise unremarkable. Electronically Signed   By: Garald Balding M.D.   On: 07/10/2015 05:09      Management plans discussed with the patient and she is in agreement. Stable for discharge home  Patient should follow up with PCP in 1 week  CODE STATUS:     Code Status Orders        Start     Ordered   07/10/15 1048  Full code   Continuous     07/10/15 1047    Code Status History    Date Active Date Inactive Code Status Order ID Comments User  Context   06/09/2015 10:56 PM 06/10/2015 10:12 PM Full Code [id number]  Lance Coon, MD Inpatient    Advance Directive Documentation        Most Recent Value   Type of Advance Directive  Healthcare Power of Attorney, Living will   Pre-existing out of facility DNR order (yellow form or pink MOST form)     "MOST" Form in Place?        TOTAL TIME TAKING CARE OF THIS PATIENT: 35 minutes.    Note: This dictation was prepared with Dragon dictation along with smaller phrase technology. Any transcriptional errors that result from this process are unintentional.  Leshawn Straka M.D on 07/11/2015 at 10:40 AM  Between 7am to 6pm - Pager - [phone] After 6pm go to [email or web address] - password [email or web address] [facility]  Office  [phone]  CC: Primary care physician; Idelle Crouch, MD

## 2015-07-11 NOTE — Progress Notes (Signed)
07/11/2015 3:19 PM  BP 119/51 mmHg  Pulse 70  Temp(Src) 98.6 F (37 C) (Oral)  Resp 16  Ht 5\' 2"  (1.575 m)  Wt 83.87 kg (184 lb 14.4 oz)  BMI 33.81 kg/m2  SpO2 98%. Patient discharged per MD orders. Discharge instructions reviewed with patient and family, and patient and family  verbalized understanding. IV removed per policy. Prescriptions discussed with patient. Discharged via wheelchair escorted by auxilary.  Almedia Balls, RN

## 2015-08-18 ENCOUNTER — Telehealth: Payer: Self-pay | Admitting: Gastroenterology

## 2015-08-18 NOTE — Telephone Encounter (Signed)
Left voice message for patient to call back and schedule appointment for upper abdominal pain/dyspepsia/nausea/vomitting with Dr. Allen Norris. Notes under media

## 2015-08-26 ENCOUNTER — Other Ambulatory Visit: Payer: Self-pay

## 2015-08-27 ENCOUNTER — Other Ambulatory Visit: Payer: Self-pay

## 2015-08-27 ENCOUNTER — Ambulatory Visit (INDEPENDENT_AMBULATORY_CARE_PROVIDER_SITE_OTHER): Payer: Medicare Other | Admitting: Gastroenterology

## 2015-08-27 ENCOUNTER — Encounter: Payer: Self-pay | Admitting: Gastroenterology

## 2015-08-27 VITALS — BP 142/75 | HR 105 | Temp 98.4°F | Ht 62.0 in | Wt 185.0 lb

## 2015-08-27 DIAGNOSIS — G43A0 Cyclical vomiting, not intractable: Secondary | ICD-10-CM | POA: Diagnosis not present

## 2015-08-27 DIAGNOSIS — R1115 Cyclical vomiting syndrome unrelated to migraine: Secondary | ICD-10-CM

## 2015-08-27 NOTE — Progress Notes (Addendum)
Gastroenterology Consultation  Referring Provider:     Idelle Crouch, MD Primary Care Physician:  Idelle Crouch, MD Primary Gastroenterologist:  Dr. Allen Norris     Reason for Consultation:     Nausea and vomiting        HPI:   Dorothy Rogers is a 65 y.o. y/o female referred for consultation & management of Nausea and vomiting by Dr. Idelle Crouch, MD.  This patient comes today with a history of nausea and vomiting. The patient states that she has been so bad with her nausea and vomiting that she has to call EMS. The patient was seen in the hospital and found to have a gallbladder stone. The patient had a recent attack after eating a greasy hamburger with fried onions. The patient denies any unexplained weight loss black stools or bloody stools. She does have some anal leakage since having a attack of C. difficile 2 months ago. The most recent tests for C. difficile was negative. The patient denies any heartburn or reflux symptoms but states that she has been told she has a hiatal hernia that was seen on the CT scan. The agents imaging did not show any sign of acute cholecystitis. She denies any abdominal pain associated with her symptoms.  Past Medical History  Diagnosis Date  . Blood transfusion without reported diagnosis   . Cataract   . Diabetes mellitus without complication ([place])   . Hyperlipidemia   . Hypertension   . Herpes simplex   . Coronary artery disease   . Anemia   . Vitamin B 12 deficiency   . Cancer ([place])     tumor back of neck-fibroushistocytoma  . Arthritis     lower back, hips  . Chronic kidney disease     Stage III  . Vertigo     occasional, no episodes in 2-3 months    Past Surgical History  Procedure Laterality Date  . Cataract extraction Left   . Coronary stent placement  03/2008  . Cardiac catheterization  2009  . Eye surgery Left     cataract extraction  . Abdominal hysterectomy    . Spine surgery      2001, 2014, 2015  . Lumbar fusion   2001, 2014, 2015  . Cystectomy Right     wrist  . Tumor excision      back of neck  . Cataract extraction w/phaco Right 03/19/2015    Procedure: CATARACT EXTRACTION PHACO AND INTRAOCULAR LENS PLACEMENT (IOC);  Surgeon: Leandrew Koyanagi, MD;  Location: [facility];  Service: Ophthalmology;  Laterality: Right;  DIABETIC - insulin and oral meds  . Back surgery      Lumbar spinal fusion 05/30/2015    Prior to Admission medications   Medication Sig Start Date End Date Taking? Authorizing Provider  acetaminophen (TYLENOL) 325 MG tablet Take 650 mg by mouth every 6 (six) hours as needed for moderate pain, fever or headache.    Yes Historical Provider, MD  acyclovir (ZOVIRAX) 400 MG tablet  08/07/15  Yes Historical Provider, MD  amLODipine (NORVASC) 5 MG tablet Take 5 mg by mouth daily.  08/29/14  Yes Historical Provider, MD  aspirin EC 81 MG tablet Take 81 mg by mouth at bedtime.   Yes Historical Provider, MD  atorvastatin (LIPITOR) 40 MG tablet Take 40 mg by mouth at bedtime.  08/16/14  Yes Historical Provider, MD  Biotin 5000 MCG TABS Take 5,000 mcg by mouth daily.    Yes Historical Provider,  MD  Calcium Carbonate-Vitamin D 600-400 MG-UNIT per tablet Take 1 tablet by mouth daily.    Yes Historical Provider, MD  clopidogrel (PLAVIX) 75 MG tablet Take 1 tablet (75 mg total) by mouth daily. 07/11/15  Yes Sital Mody, MD  Cyanocobalamin (RA VITAMIN B-12 TR) 1000 MCG TBCR Take 1 tablet by mouth daily.    Yes Historical Provider, MD  ezetimibe (ZETIA) 10 MG tablet Take 10 mg by mouth at bedtime.  11/26/13  Yes Historical Provider, MD  Ferrous Sulfate (IRON) 325 (65 FE) MG TABS Take 1 tablet by mouth daily.    Yes Historical Provider, MD  glipiZIDE (GLUCOTROL) 10 MG tablet Take 1 tablet by mouth 2 (two) times daily. 07/04/15  Yes Historical Provider, MD  Insulin Glargine (LANTUS SOLOSTAR) 100 UNIT/ML Solostar Pen Inject into the skin. 02/12/15  Yes Historical Provider, MD  Insulin Pen Needle (ULTICARE  MICRO PEN NEEDLES) 32G X 4 MM MISC  08/20/15  Yes Historical Provider, MD  losartan (COZAAR) 100 MG tablet Take 100 mg by mouth daily.  02/05/14  Yes Historical Provider, MD  meclizine (ANTIVERT) 25 MG tablet Take 25 mg by mouth 3 (three) times daily as needed for dizziness.   Yes Historical Provider, MD  ondansetron (ZOFRAN) 4 MG tablet  05/14/14  Yes Historical Provider, MD  metoCLOPramide (REGLAN) 5 MG tablet Take 1 tablet (5 mg total) by mouth 3 (three) times daily. Patient not taking: Reported on 08/27/2015 06/16/15   Ahmed Prima, MD    Family History  Problem Relation Age of Onset  . Heart disease Father   . Diabetes Other      Social History  Substance Use Topics  . Smoking status: Never Smoker   . Smokeless tobacco: Never Used  . Alcohol Use: No     Comment: 2-3 drinks per year    Allergies as of 08/27/2015 - Review Complete 08/27/2015  Allergen Reaction Noted  . Beta adrenergic blockers Hives 11/18/2014  . Celebrex [celecoxib] Other (See Comments) 11/18/2014  . Niacin Other (See Comments) 08/26/2015  . Niacin and related Other (See Comments) 11/18/2014  . Oxycodone Nausea Only 08/26/2015  . Shellfish allergy Nausea And Vomiting 11/18/2014  . Vicodin [hydrocodone-acetaminophen] Other (See Comments) 11/18/2014    Review of Systems:    All systems reviewed and negative except where noted in HPI.   Physical Exam:  BP 142/75 mmHg  Pulse 105  Temp(Src) 98.4 F (36.9 C) (Oral)  Ht 5\' 2"  (1.575 m)  Wt 185 lb (83.915 kg)  BMI 33.83 kg/m2 No LMP recorded. Patient has had a hysterectomy. Psych:  Alert and cooperative. Normal mood and affect. General:   Alert,  Well-developed, well-nourished, pleasant and cooperative in NAD Head:  Normocephalic and atraumatic. Eyes:  Sclera clear, no icterus.   Conjunctiva pink. Ears:  Normal auditory acuity. Nose:  No deformity, discharge, or lesions. Mouth:  No deformity or lesions,oropharynx pink & moist. Neck:  Supple; no masses  or thyromegaly. Lungs:  Respirations even and unlabored.  Clear throughout to auscultation.   No wheezes, crackles, or rhonchi. No acute distress. Heart:  Regular rate and rhythm; no murmurs, clicks, rubs, or gallops. Abdomen:  Normal bowel sounds.  No bruits.  Soft, non-tender and non-distended without masses, hepatosplenomegaly or hernias noted.  No guarding or rebound tenderness.  Negative Carnett sign.   Rectal:  Deferred.  Msk:  Symmetrical without gross deformities.  Good, equal movement & strength bilaterally. Pulses:  Normal pulses noted. Extremities:  No clubbing or  edema.  No cyanosis. Neurologic:  Alert and oriented x3;  grossly normal neurologically. Skin:  Intact without significant lesions or rashes.  No jaundice. Lymph Nodes:  No significant cervical adenopathy. Psych:  Alert and cooperative. Normal mood and affect.  Imaging Studies: No results found.  Assessment and Plan:   Dorothy Rogers is a 65 y.o. y/o female who has a history of C. difficile colitis who now comes with a report of recurrent nausea and vomiting. The patient has been found to have gallstones and a hiatal hernia. The patient will be set up for an upper endoscopy. The patient will also be tried on a trial of Dexilant. If she does not improve and the upper endoscopy does not cause for her nausea and vomiting she may need to have her gallbladder taken out to see if this stops her symptoms.I have discussed risks & benefits which include, but are not limited to, bleeding, infection, perforation & drug reaction.  The patient agrees with this plan & written consent will be obtained.      Note: This dictation was prepared with Dragon dictation along with smaller phrase technology. Any transcriptional errors that result from this process are unintentional.

## 2015-08-29 ENCOUNTER — Telehealth: Payer: Self-pay

## 2015-08-29 NOTE — Telephone Encounter (Signed)
Per has been notified per Dr. Ubaldo Glassing to stop plavix 5 days prior to procedure. Blood thinner request is located in Media.

## 2015-09-02 NOTE — Discharge Instructions (Signed)

## 2015-09-04 ENCOUNTER — Ambulatory Visit
Admission: RE | Admit: 2015-09-04 | Discharge: 2015-09-04 | Disposition: A | Discharge status: Home / Self Care | Payer: Medicare Other | Source: Ambulatory Visit | Attending: Gastroenterology | Admitting: Gastroenterology

## 2015-09-04 ENCOUNTER — Ambulatory Visit: Payer: Medicare Other | Admitting: Anesthesiology

## 2015-09-04 ENCOUNTER — Encounter
Admission: RE | Disposition: A | Discharge status: Home / Self Care | Payer: Self-pay | Source: Ambulatory Visit | Attending: Gastroenterology

## 2015-09-04 DIAGNOSIS — Q399 Congenital malformation of esophagus, unspecified: Secondary | ICD-10-CM | POA: Diagnosis not present

## 2015-09-04 DIAGNOSIS — I2510 Atherosclerotic heart disease of native coronary artery without angina pectoris: Secondary | ICD-10-CM | POA: Diagnosis not present

## 2015-09-04 DIAGNOSIS — Z8249 Family history of ischemic heart disease and other diseases of the circulatory system: Secondary | ICD-10-CM | POA: Insufficient documentation

## 2015-09-04 DIAGNOSIS — E785 Hyperlipidemia, unspecified: Secondary | ICD-10-CM | POA: Insufficient documentation

## 2015-09-04 DIAGNOSIS — Z794 Long term (current) use of insulin: Secondary | ICD-10-CM | POA: Insufficient documentation

## 2015-09-04 DIAGNOSIS — Z981 Arthrodesis status: Secondary | ICD-10-CM | POA: Diagnosis not present

## 2015-09-04 DIAGNOSIS — Z7982 Long term (current) use of aspirin: Secondary | ICD-10-CM | POA: Insufficient documentation

## 2015-09-04 DIAGNOSIS — Z90710 Acquired absence of both cervix and uterus: Secondary | ICD-10-CM | POA: Insufficient documentation

## 2015-09-04 DIAGNOSIS — K449 Diaphragmatic hernia without obstruction or gangrene: Secondary | ICD-10-CM | POA: Diagnosis not present

## 2015-09-04 DIAGNOSIS — Z833 Family history of diabetes mellitus: Secondary | ICD-10-CM | POA: Insufficient documentation

## 2015-09-04 DIAGNOSIS — G43A0 Cyclical vomiting, not intractable: Secondary | ICD-10-CM | POA: Diagnosis not present

## 2015-09-04 DIAGNOSIS — Z91013 Allergy to seafood: Secondary | ICD-10-CM | POA: Diagnosis not present

## 2015-09-04 DIAGNOSIS — Z888 Allergy status to other drugs, medicaments and biological substances status: Secondary | ICD-10-CM | POA: Diagnosis not present

## 2015-09-04 DIAGNOSIS — E538 Deficiency of other specified B group vitamins: Secondary | ICD-10-CM | POA: Insufficient documentation

## 2015-09-04 DIAGNOSIS — M5136 Other intervertebral disc degeneration, lumbar region: Secondary | ICD-10-CM | POA: Diagnosis not present

## 2015-09-04 DIAGNOSIS — Z79899 Other long term (current) drug therapy: Secondary | ICD-10-CM | POA: Insufficient documentation

## 2015-09-04 DIAGNOSIS — Z9842 Cataract extraction status, left eye: Secondary | ICD-10-CM | POA: Diagnosis not present

## 2015-09-04 DIAGNOSIS — Z9889 Other specified postprocedural states: Secondary | ICD-10-CM | POA: Insufficient documentation

## 2015-09-04 DIAGNOSIS — N183 Chronic kidney disease, stage 3 (moderate): Secondary | ICD-10-CM | POA: Insufficient documentation

## 2015-09-04 DIAGNOSIS — K2950 Unspecified chronic gastritis without bleeding: Secondary | ICD-10-CM | POA: Insufficient documentation

## 2015-09-04 DIAGNOSIS — I129 Hypertensive chronic kidney disease with stage 1 through stage 4 chronic kidney disease, or unspecified chronic kidney disease: Secondary | ICD-10-CM | POA: Insufficient documentation

## 2015-09-04 DIAGNOSIS — Z9841 Cataract extraction status, right eye: Secondary | ICD-10-CM | POA: Diagnosis not present

## 2015-09-04 DIAGNOSIS — Z7902 Long term (current) use of antithrombotics/antiplatelets: Secondary | ICD-10-CM | POA: Diagnosis not present

## 2015-09-04 DIAGNOSIS — E119 Type 2 diabetes mellitus without complications: Secondary | ICD-10-CM | POA: Diagnosis not present

## 2015-09-04 DIAGNOSIS — K2970 Gastritis, unspecified, without bleeding: Secondary | ICD-10-CM | POA: Insufficient documentation

## 2015-09-04 DIAGNOSIS — Z85831 Personal history of malignant neoplasm of soft tissue: Secondary | ICD-10-CM | POA: Diagnosis not present

## 2015-09-04 DIAGNOSIS — Z885 Allergy status to narcotic agent status: Secondary | ICD-10-CM | POA: Diagnosis not present

## 2015-09-04 DIAGNOSIS — D649 Anemia, unspecified: Secondary | ICD-10-CM | POA: Insufficient documentation

## 2015-09-04 DIAGNOSIS — R1115 Cyclical vomiting syndrome unrelated to migraine: Secondary | ICD-10-CM | POA: Insufficient documentation

## 2015-09-04 DIAGNOSIS — R112 Nausea with vomiting, unspecified: Secondary | ICD-10-CM | POA: Insufficient documentation

## 2015-09-04 HISTORY — DX: Atherosclerotic heart disease of native coronary artery without angina pectoris: I25.10

## 2015-09-04 HISTORY — DX: Other intervertebral disc degeneration, lumbar region: M51.36

## 2015-09-04 HISTORY — PX: ESOPHAGOGASTRODUODENOSCOPY (EGD) WITH PROPOFOL: SHX5813

## 2015-09-04 HISTORY — DX: Other intervertebral disc degeneration, lumbar region without mention of lumbar back pain or lower extremity pain: M51.369

## 2015-09-04 LAB — GLUCOSE, CAPILLARY
Glucose-Capillary: 144 mg/dL — ABNORMAL HIGH (ref 65–99)
Glucose-Capillary: 130 mg/dL — ABNORMAL HIGH (ref 65–99)

## 2015-09-04 SURGERY — ESOPHAGOGASTRODUODENOSCOPY (EGD) WITH PROPOFOL
Anesthesia: Monitor Anesthesia Care | Wound class: Clean Contaminated

## 2015-09-04 MED ORDER — ONDANSETRON HCL 4 MG/2ML IJ SOLN
INTRAMUSCULAR | Status: DC | PRN
  Administered 2015-09-04: 4 mg via INTRAVENOUS

## 2015-09-04 MED ORDER — LACTATED RINGERS IV SOLN
INTRAVENOUS | Status: DC
  Administered 2015-09-04 (×2): via INTRAVENOUS

## 2015-09-04 MED ORDER — GLYCOPYRROLATE 0.2 MG/ML IJ SOLN
INTRAMUSCULAR | Status: DC | PRN
  Administered 2015-09-04: 0.2 mg via INTRAVENOUS

## 2015-09-04 MED ORDER — SODIUM CHLORIDE 0.9 % IV SOLN: INTRAVENOUS | Status: DC

## 2015-09-04 MED ORDER — PROPOFOL 10 MG/ML IV BOLUS
INTRAVENOUS | Status: DC | PRN
  Administered 2015-09-04: 50 mg via INTRAVENOUS
  Administered 2015-09-04: 80 mg via INTRAVENOUS

## 2015-09-04 MED ORDER — LIDOCAINE HCL (CARDIAC) 20 MG/ML IV SOLN
INTRAVENOUS | Status: DC | PRN
  Administered 2015-09-04: 20 mg via INTRAVENOUS

## 2015-09-04 SURGICAL SUPPLY — 31 items
BALLN DILATOR 10-12 8 (BALLOONS)
BALLN DILATOR 12-15 8 (BALLOONS)
BALLN DILATOR 15-18 8 (BALLOONS)
BALLN DILATOR CRE 0-12 8 (BALLOONS)
BALLN DILATOR ESOPH 8 10 CRE (MISCELLANEOUS) IMPLANT
BALLOON DILATOR 12-15 8 (BALLOONS) IMPLANT
BALLOON DILATOR 15-18 8 (BALLOONS) IMPLANT
BALLOON DILATOR CRE 0-12 8 (BALLOONS) IMPLANT
BLOCK BITE 60FR ADLT L/F GRN (MISCELLANEOUS) ×3 IMPLANT
CANISTER SUCT 1200ML W/VALVE (MISCELLANEOUS) ×3 IMPLANT
CLIP HMST 235XBRD CATH ROT (MISCELLANEOUS) IMPLANT
CLIP RESOLUTION 360 11X235 (MISCELLANEOUS)
FCP ESCP3.2XJMB 240X2.8X (MISCELLANEOUS)
FORCEPS BIOP RAD 4 LRG CAP 4 (CUTTING FORCEPS) ×3 IMPLANT
FORCEPS BIOP RJ4 240 W/NDL (MISCELLANEOUS)
FORCEPS ESCP3.2XJMB 240X2.8X (MISCELLANEOUS) IMPLANT
GOWN CVR UNV OPN BCK APRN NK (MISCELLANEOUS) ×2 IMPLANT
GOWN ISOL THUMB LOOP REG UNIV (MISCELLANEOUS) ×4
INJECTOR VARIJECT VIN23 (MISCELLANEOUS) IMPLANT
KIT DEFENDO VALVE AND CONN (KITS) IMPLANT
KIT ENDO PROCEDURE OLY (KITS) ×3 IMPLANT
MARKER SPOT ENDO TATTOO 5ML (MISCELLANEOUS) IMPLANT
PAD GROUND ADULT SPLIT (MISCELLANEOUS) IMPLANT
SNARE SHORT THROW 13M SML OVAL (MISCELLANEOUS) IMPLANT
SNARE SHORT THROW 30M LRG OVAL (MISCELLANEOUS) IMPLANT
SPOT EX ENDOSCOPIC TATTOO (MISCELLANEOUS)
SYR INFLATION 60ML (SYRINGE) IMPLANT
VARIJECT INJECTOR VIN23 (MISCELLANEOUS)
WATER STERILE IRR 250ML POUR (IV SOLUTION) ×3 IMPLANT
WIDE-EYE POLYPTRAP (MISCELLANEOUS) IMPLANT
WIRE CRE 18-20MM 8CM F G (MISCELLANEOUS) IMPLANT

## 2015-09-04 NOTE — Anesthesia Procedure Notes (Signed)
Procedure Name: MAC Performed by: Tyner Codner Pre-anesthesia Checklist: Patient identified, Emergency Drugs available, Suction available, Patient being monitored and Timeout performed Patient Re-evaluated:Patient Re-evaluated prior to inductionOxygen Delivery Method: Nasal cannula       

## 2015-09-04 NOTE — H&P (Signed)
[facility]  [address], [place], [place] [address] Phone: [phone] Fax : [phone]  Primary Care Physician:  Idelle Crouch, MD Primary Gastroenterologist:  Dr. Allen Norris  Pre-Procedure History & Physical: HPI:  Dorothy Rogers is a 65 y.o. female is here for an endoscopy.   Past Medical History  Diagnosis Date  . Blood transfusion without reported diagnosis   . Cataract   . Hyperlipidemia   . Herpes simplex   . Coronary artery disease   . Anemia   . Vitamin B 12 deficiency   . Cancer ([place])     tumor back of neck-fibroushistocytoma  . Vertigo     occasional, no episodes in 2-3 months  . Hypertension     CONTROLLED ON MEDS  . CAD (coronary artery disease) FEB AND NOV 2009    6 STENTS  . Anemia   . Arthritis     lower back, hips  . DDD (degenerative disc disease), lumbar   . Diabetes mellitus without complication ([place])     TYPE 2  . Chronic kidney disease     Stage III  . Vertigo     OCCASIONALLY    Past Surgical History  Procedure Laterality Date  . Cataract extraction Left   . Coronary stent placement  03/2008  . Cardiac catheterization  2009  . Eye surgery Left     cataract extraction  . Abdominal hysterectomy    . Spine surgery      2001, 2014, 2015  . Lumbar fusion  2001, 2014, 2015  . Cystectomy Right     wrist  . Tumor excision      back of neck  . Cataract extraction w/phaco Right 03/19/2015    Procedure: CATARACT EXTRACTION PHACO AND INTRAOCULAR LENS PLACEMENT (IOC);  Surgeon: Leandrew Koyanagi, MD;  Location: [facility];  Service: Ophthalmology;  Laterality: Right;  DIABETIC - insulin and oral meds  . Back surgery      Lumbar spinal fusion 05/30/2015    Prior to Admission medications   Medication Sig Start Date End Date Taking? Authorizing Provider  acetaminophen (TYLENOL) 325 MG tablet Take 650 mg by mouth every 6 (six) hours as needed for moderate pain, fever or headache.    Yes Historical Provider,  MD  acyclovir (ZOVIRAX) 400 MG tablet as needed.  08/07/15  Yes Historical Provider, MD  amLODipine (NORVASC) 5 MG tablet Take 5 mg by mouth daily. AM 08/29/14  Yes Historical Provider, MD  aspirin EC 81 MG tablet Take 81 mg by mouth at bedtime.   Yes Historical Provider, MD  atorvastatin (LIPITOR) 40 MG tablet Take 40 mg by mouth at bedtime.  08/16/14  Yes Historical Provider, MD  Biotin 5000 MCG TABS Take 5,000 mcg by mouth daily. AM   Yes Historical Provider, MD  Calcium Carbonate-Vitamin D 600-400 MG-UNIT per tablet Take 1 tablet by mouth daily. AM   Yes Historical Provider, MD  clopidogrel (PLAVIX) 75 MG tablet Take 1 tablet (75 mg total) by mouth daily. 07/11/15  Yes Sital Mody, MD  Cyanocobalamin (RA VITAMIN B-12 TR) 1000 MCG TBCR Take 1 tablet by mouth daily. AM   Yes Historical Provider, MD  ezetimibe (ZETIA) 10 MG tablet Take 10 mg by mouth at bedtime.  11/26/13  Yes Historical Provider, MD  Ferrous Sulfate (IRON) 325 (65 FE) MG TABS Take 1 tablet by mouth daily. AM   Yes Historical Provider, MD  glipiZIDE (GLUCOTROL) 10 MG tablet Take 1 tablet by mouth 2 (  two) times daily. 07/04/15  Yes Historical Provider, MD  Insulin Glargine (LANTUS SOLOSTAR) 100 UNIT/ML Solostar Pen Inject 36 Units into the skin daily. DEPENDING ON BLOOD SUGAR MAY TAKE AT NIGHT 02/12/15  Yes Historical Provider, MD  Insulin Pen Needle (ULTICARE MICRO PEN NEEDLES) 32G X 4 MM MISC  08/20/15  Yes Historical Provider, MD  losartan (COZAAR) 100 MG tablet Take 100 mg by mouth daily. AM 02/05/14  Yes Historical Provider, MD  meclizine (ANTIVERT) 25 MG tablet Take 25 mg by mouth 3 (three) times daily as needed for dizziness.   Yes Historical Provider, MD  ondansetron (ZOFRAN) 4 MG tablet as needed.  05/14/14  Yes Historical Provider, MD    Allergies as of 08/27/2015 - Review Complete 08/27/2015  Allergen Reaction Noted  . Beta adrenergic blockers Hives 11/18/2014  . Celebrex [celecoxib] Other (See Comments) 11/18/2014  . Niacin  Other (See Comments) 08/26/2015  . Niacin and related Other (See Comments) 11/18/2014  . Oxycodone Nausea Only 08/26/2015  . Shellfish allergy Nausea And Vomiting 11/18/2014  . Vicodin [hydrocodone-acetaminophen] Other (See Comments) 11/18/2014    Family History  Problem Relation Age of Onset  . Heart disease Father   . Diabetes Other     Social History   Social History  . Marital Status: Married    Spouse Name: N/A  . Number of Children: N/A  . Years of Education: N/A   Occupational History  . Not on file.   Social History Main Topics  . Smoking status: Never Smoker   . Smokeless tobacco: Never Used  . Alcohol Use: No     Comment: 2-3 drinks per year  . Drug Use: No  . Sexual Activity: Not on file   Other Topics Concern  . Not on file   Social History Narrative    Review of Systems: See HPI, otherwise negative ROS  Physical Exam: BP 127/60 mmHg  Pulse 93  Temp(Src) 98.8 F (37.1 C) (Temporal)  Resp 16  Ht 5\' 4"  (1.626 m)  Wt 182 lb (82.555 kg)  BMI 31.22 kg/m2  SpO2 97% General:   Alert,  pleasant and cooperative in NAD Head:  Normocephalic and atraumatic. Neck:  Supple; no masses or thyromegaly. Lungs:  Clear throughout to auscultation.    Heart:  Regular rate and rhythm. Abdomen:  Soft, nontender and nondistended. Normal bowel sounds, without guarding, and without rebound.   Neurologic:  Alert and  oriented x4;  grossly normal neurologically.  Impression/Plan: Dorothy Rogers is here for an endoscopy to be performed for nusea and vomiting  Risks, benefits, limitations, and alternatives regarding  endoscopy have been reviewed with the patient.  Questions have been answered.  All parties agreeable.   Ollen Bowl, MD  09/04/2015, 7:23 AM

## 2015-09-04 NOTE — Transfer of Care (Signed)
Immediate Anesthesia Transfer of Care Note  Patient: Dorothy Rogers  Procedure(s) Performed: Procedure(s) with comments: ESOPHAGOGASTRODUODENOSCOPY (EGD) WITH PROPOFOL (N/A) - INSULIN DEPENDENT DIABETIC  Patient Location: PACU  Anesthesia Type: MAC  Level of Consciousness: awake, alert  and patient cooperative  Airway and Oxygen Therapy: Patient Spontanous Breathing and Patient connected to supplemental oxygen  Post-op Assessment: Post-op Vital signs reviewed, Patient's Cardiovascular Status Stable, Respiratory Function Stable, Patent Airway and No signs of Nausea or vomiting  Post-op Vital Signs: Reviewed and stable  Complications: No apparent anesthesia complications

## 2015-09-04 NOTE — Anesthesia Preprocedure Evaluation (Signed)
Anesthesia Evaluation  Patient identified by MRN, date of birth, ID band Patient awake    Reviewed: Allergy & Precautions, H&P , NPO status , Patient's Chart, lab work & pertinent test results, reviewed documented beta blocker date and time   Airway Mallampati: II  TM Distance: >3 FB Neck ROM: full    Dental no notable dental hx.    Pulmonary neg pulmonary ROS,    Pulmonary exam normal breath sounds clear to auscultation       Cardiovascular Exercise Tolerance: Good hypertension, On Medications + CAD and + Cardiac Stents (x6)  Normal cardiovascular exam Rhythm:regular Rate:Normal     Neuro/Psych negative neurological ROS  negative psych ROS   GI/Hepatic negative GI ROS, Neg liver ROS,   Endo/Other  diabetes, Well Controlled, Type 2, Oral Hypoglycemic Agents  Renal/GU Renal disease (stage 3)  negative genitourinary   Musculoskeletal   Abdominal   Peds  Hematology negative hematology ROS (+)   Anesthesia Other Findings   Reproductive/Obstetrics negative OB ROS                             Anesthesia Physical Anesthesia Plan  ASA: III  Anesthesia Plan: MAC   Post-op Pain Management:    Induction:   Airway Management Planned:   Additional Equipment:   Intra-op Plan:   Post-operative Plan:   Informed Consent: I have reviewed the patients History and Physical, chart, labs and discussed the procedure including the risks, benefits and alternatives for the proposed anesthesia with the patient or authorized representative who has indicated his/her understanding and acceptance.   Dental Advisory Given  Plan Discussed with: CRNA  Anesthesia Plan Comments:         Anesthesia Quick Evaluation

## 2015-09-04 NOTE — Anesthesia Postprocedure Evaluation (Signed)
Anesthesia Post Note  Patient: Dorothy Rogers  Procedure(s) Performed: Procedure(s) (LRB): ESOPHAGOGASTRODUODENOSCOPY (EGD) WITH PROPOFOL (N/A)  Patient location during evaluation: PACU Anesthesia Type: MAC Level of consciousness: awake and alert Pain management: pain level controlled Vital Signs Assessment: post-procedure vital signs reviewed and stable Respiratory status: spontaneous breathing, nonlabored ventilation, respiratory function stable and patient connected to nasal cannula oxygen Cardiovascular status: blood pressure returned to baseline and stable Postop Assessment: no signs of nausea or vomiting Anesthetic complications: no    Alisa Graff

## 2015-09-04 NOTE — Op Note (Addendum)
[facility] Gastroenterology Patient Name: Dorothy Rogers Procedure Date: 09/04/2015 7:58 AM MRN: [id number] Account #: [id number] Date of Birth: [birth year] Admit Type: Outpatient Age: 65 Room: [facility] Inpatient OR ROOM 01 Gender: Female Note Status: Supervisor Override Procedure:            Upper GI endoscopy Indications:          Nausea with vomiting Providers:            Lucilla Lame, MD Referring MD:         Leonie Douglas. Doy Hutching, MD (Referring MD) Medicines:            Propofol per Anesthesia Complications:        No immediate complications. Procedure:            Pre-Anesthesia Assessment:                       - Prior to the procedure, a History and Physical was                        performed, and patient medications and allergies were                        reviewed. The patient's tolerance of previous                        anesthesia was also reviewed. The risks and benefits of                        the procedure and the sedation options and risks were                        discussed with the patient. All questions were                        answered, and informed consent was obtained. Prior                        Anticoagulants: The patient has taken no previous                        anticoagulant or antiplatelet agents. ASA Grade                        Assessment: II - A patient with mild systemic disease.                        After reviewing the risks and benefits, the patient was                        deemed in satisfactory condition to undergo the                        procedure.                       After obtaining informed consent, the endoscope was                        passed under direct vision. Throughout the procedure,  the patient's blood pressure, pulse, and oxygen                        saturations were monitored continuously. The Olympus                        GIF-HQ190 Endoscope (S#. [id number]) was introduced          through the mouth, and advanced to the second part of                        duodenum. The upper GI endoscopy was accomplished                        without difficulty. The patient tolerated the procedure                        well. Findings:      The distal esophagus was moderately tortuous.      A medium-sized hiatal hernia was present.      Localized mild inflammation characterized by erythema was found in the       gastric antrum. Biopsies were taken with a cold forceps for histology.      The examined duodenum was normal. Impression:           - Tortuous esophagus.                       - Medium-sized hiatal hernia.                       - Gastritis. Biopsied.                       - Normal examined duodenum. Recommendation:       - Await pathology results.                       - Continue present medications. Procedure Code(s):    --- Professional ---                       [id number], Esophagogastroduodenoscopy, flexible, transoral;                        with biopsy, single or multiple Diagnosis Code(s):    --- Professional ---                       R11.2, Nausea with vomiting, unspecified                       Q39.9, Congenital malformation of esophagus, unspecified                       K44.9, Diaphragmatic hernia without obstruction or                        gangrene                       K29.70, Gastritis, unspecified, without bleeding CPT copyright 2016 American Medical Association. All rights reserved. The codes documented in this report are preliminary and upon coder review may  be revised to meet current compliance requirements. Lucilla Lame MD, MD 09/04/2015  8:12:31 AM This report has been signed electronically. Number of Addenda: 0 Note Initiated On: 09/04/2015 7:58 AM Total Procedure Duration: 0 hours 2 minutes 35 seconds       [facility]

## 2015-09-05 ENCOUNTER — Encounter: Payer: Self-pay | Admitting: Gastroenterology

## 2015-09-10 ENCOUNTER — Encounter: Payer: Self-pay | Admitting: Gastroenterology

## 2015-09-11 ENCOUNTER — Encounter: Payer: Self-pay | Admitting: Gastroenterology

## 2015-10-15 HISTORY — PX: LEFT HEART CATH AND CORONARY ANGIOGRAPHY: CATH118249

## 2015-12-01 ENCOUNTER — Emergency Department: Payer: Medicare Other

## 2015-12-01 ENCOUNTER — Emergency Department
Admission: EM | Admit: 2015-12-01 | Discharge: 2015-12-01 | Disposition: A | Discharge status: Home / Self Care | Payer: Medicare Other | Attending: Emergency Medicine | Admitting: Emergency Medicine

## 2015-12-01 DIAGNOSIS — E785 Hyperlipidemia, unspecified: Secondary | ICD-10-CM | POA: Diagnosis not present

## 2015-12-01 DIAGNOSIS — K449 Diaphragmatic hernia without obstruction or gangrene: Secondary | ICD-10-CM | POA: Insufficient documentation

## 2015-12-01 DIAGNOSIS — Z7982 Long term (current) use of aspirin: Secondary | ICD-10-CM | POA: Insufficient documentation

## 2015-12-01 DIAGNOSIS — M159 Polyosteoarthritis, unspecified: Secondary | ICD-10-CM | POA: Insufficient documentation

## 2015-12-01 DIAGNOSIS — Z8679 Personal history of other diseases of the circulatory system: Secondary | ICD-10-CM | POA: Insufficient documentation

## 2015-12-01 DIAGNOSIS — E1122 Type 2 diabetes mellitus with diabetic chronic kidney disease: Secondary | ICD-10-CM | POA: Diagnosis not present

## 2015-12-01 DIAGNOSIS — R0600 Dyspnea, unspecified: Secondary | ICD-10-CM | POA: Insufficient documentation

## 2015-12-01 DIAGNOSIS — N183 Chronic kidney disease, stage 3 (moderate): Secondary | ICD-10-CM | POA: Insufficient documentation

## 2015-12-01 DIAGNOSIS — Z7984 Long term (current) use of oral hypoglycemic drugs: Secondary | ICD-10-CM | POA: Diagnosis not present

## 2015-12-01 DIAGNOSIS — I2510 Atherosclerotic heart disease of native coronary artery without angina pectoris: Secondary | ICD-10-CM | POA: Diagnosis not present

## 2015-12-01 DIAGNOSIS — R064 Hyperventilation: Secondary | ICD-10-CM | POA: Insufficient documentation

## 2015-12-01 DIAGNOSIS — Z955 Presence of coronary angioplasty implant and graft: Secondary | ICD-10-CM | POA: Insufficient documentation

## 2015-12-01 DIAGNOSIS — Z79899 Other long term (current) drug therapy: Secondary | ICD-10-CM | POA: Insufficient documentation

## 2015-12-01 DIAGNOSIS — Z794 Long term (current) use of insulin: Secondary | ICD-10-CM | POA: Diagnosis not present

## 2015-12-01 DIAGNOSIS — F4320 Adjustment disorder, unspecified: Secondary | ICD-10-CM | POA: Insufficient documentation

## 2015-12-01 DIAGNOSIS — I129 Hypertensive chronic kidney disease with stage 1 through stage 4 chronic kidney disease, or unspecified chronic kidney disease: Secondary | ICD-10-CM | POA: Insufficient documentation

## 2015-12-01 DIAGNOSIS — R0602 Shortness of breath: Secondary | ICD-10-CM | POA: Diagnosis present

## 2015-12-01 LAB — COMPREHENSIVE METABOLIC PANEL
ALBUMIN: 4.2 g/dL (ref 3.5–5.0)
ALK PHOS: 95 U/L (ref 38–126)
ALT: 25 U/L (ref 14–54)
AST: 23 U/L (ref 15–41)
Anion gap: 10 (ref 5–15)
BILIRUBIN TOTAL: 0.7 mg/dL (ref 0.3–1.2)
BUN: 37 mg/dL — ABNORMAL HIGH (ref 6–20)
CALCIUM: 8.7 mg/dL — AB (ref 8.9–10.3)
CO2: 21 mmol/L — AB (ref 22–32)
CREATININE: 1.69 mg/dL — AB (ref 0.44–1.00)
Chloride: 109 mmol/L (ref 101–111)
GFR calc Af Amer: 36 mL/min — ABNORMAL LOW (ref >60)
GFR calc non Af Amer: 31 mL/min — ABNORMAL LOW (ref >60)
GLUCOSE: 144 mg/dL — AB (ref 65–99)
Potassium: 3.6 mmol/L (ref 3.5–5.1)
SODIUM: 140 mmol/L (ref 135–145)
TOTAL PROTEIN: 7 g/dL (ref 6.5–8.1)

## 2015-12-01 LAB — CBC WITH DIFFERENTIAL/PLATELET
Basophils Absolute: 0 10*3/uL (ref 0–0.1)
Basophils Relative: 0 %
EOS ABS: 0.1 10*3/uL (ref 0–0.7)
Eosinophils Relative: 1 %
HEMATOCRIT: 31.3 % — AB (ref 35.0–47.0)
HEMOGLOBIN: 10.7 g/dL — AB (ref 12.0–16.0)
LYMPHS ABS: 1.7 10*3/uL (ref 1.0–3.6)
Lymphocytes Relative: 15 %
MCH: 30.9 pg (ref 26.0–34.0)
MCHC: 34.2 g/dL (ref 32.0–36.0)
MCV: 90.5 fL (ref 80.0–100.0)
MONOS PCT: 6 %
Monocytes Absolute: 0.6 10*3/uL (ref 0.2–0.9)
NEUTROS ABS: 8.8 10*3/uL — AB (ref 1.4–6.5)
NEUTROS PCT: 78 %
Platelets: 191 10*3/uL (ref 150–440)
RBC: 3.46 MIL/uL — AB (ref 3.80–5.20)
RDW: 12.8 % (ref 11.5–14.5)
WBC: 11.2 10*3/uL — AB (ref 3.6–11.0)

## 2015-12-01 LAB — BRAIN NATRIURETIC PEPTIDE: B Natriuretic Peptide: 33 pg/mL (ref 0.0–100.0)

## 2015-12-01 LAB — BLOOD GAS, ARTERIAL
FIO2: 0.45
PATIENT TEMPERATURE: 37
PCO2 ART: 17 mmHg — AB (ref 32.0–48.0)
PO2 ART: 169 mmHg — AB (ref 83.0–108.0)
pH, Arterial: 7.67 (ref 7.350–7.450)

## 2015-12-01 LAB — TROPONIN I
Troponin I: <0.03 ng/mL (ref <0.03)
Troponin I: <0.03 ng/mL (ref <0.03)

## 2015-12-01 MED ORDER — IPRATROPIUM-ALBUTEROL 0.5-2.5 (3) MG/3ML IN SOLN
3.0000 mL | Freq: Once | RESPIRATORY_TRACT | Status: AC
  Administered 2015-12-01: 3 mL via RESPIRATORY_TRACT
  Filled 2015-12-01: qty 3

## 2015-12-01 MED ORDER — SODIUM CHLORIDE 0.9 % IV BOLUS (SEPSIS)
1000.0000 mL | Freq: Once | INTRAVENOUS | Status: AC
  Administered 2015-12-01: 1000 mL via INTRAVENOUS

## 2015-12-01 MED ORDER — GI COCKTAIL ~~LOC~~
30.0000 mL | Freq: Once | ORAL | Status: AC
  Administered 2015-12-01: 30 mL via ORAL
  Filled 2015-12-01: qty 30

## 2015-12-01 MED ORDER — IOPAMIDOL (ISOVUE-370) INJECTION 76%
75.0000 mL | Freq: Once | INTRAVENOUS | Status: AC | PRN
Start: 2015-12-01 — End: 2015-12-01
  Administered 2015-12-01: 75 mL via INTRAVENOUS

## 2015-12-01 NOTE — Discharge Instructions (Signed)
Return to the ER for recurrent or worsening symptoms, persistent vomiting, difficult to breathing or other concerns.  Hiatal Hernia A hiatal hernia occurs when part of your stomach slides above the muscle that separates your abdomen from your chest (diaphragm). You can be born with a hiatal hernia (congenital), or it may develop over time. In almost all cases of hiatal hernia, only the top part of the stomach pushes through.  Many people have a hiatal hernia with no symptoms. The larger the hernia, the more likely that you will have symptoms. In some cases, a hiatal hernia allows stomach acid to flow back into the tube that carries food from your mouth to your stomach (esophagus). This may cause heartburn symptoms. Severe heartburn symptoms may mean you have developed a condition called gastroesophageal reflux disease (GERD).  CAUSES  Hiatal hernias are caused by a weakness in the opening (hiatus) where your esophagus passes through your diaphragm to attach to the upper part of your stomach. You may be born with a weakness in your hiatus, or a weakness can develop. RISK FACTORS Older age is a major risk factor for a hiatal hernia. Anything that increases pressure on your diaphragm can also increase your risk of a hiatal hernia. This includes:  Pregnancy.  Excess weight.  Frequent constipation. SIGNS AND SYMPTOMS  People with a hiatal hernia often have no symptoms. If symptoms develop, they are almost always caused by GERD. They may include:  Heartburn.  Belching.  Indigestion.  Trouble swallowing.  Coughing or wheezing.  Sore throat.  Hoarseness.  Chest pain. DIAGNOSIS  A hiatal hernia is sometimes found during an exam for another problem. Your health care provider may suspect a hiatal hernia if you have symptoms of GERD. Tests may be done to diagnose GERD. These may include:  X-rays of your stomach or chest.  An upper gastrointestinal (GI) series. This is an X-ray exam of  your GI tract involving the use of a chalky liquid that you swallow. The liquid shows up clearly on the X-ray.  Endoscopy. This is a procedure to look into your stomach using a thin, flexible tube that has a tiny camera and light on the end of it. TREATMENT  If you have no symptoms, you may not need treatment. If you have symptoms, treatment may include:  Dietary and lifestyle changes to help reduce GERD symptoms.  Medicines. These may include:  Over-the-counter antacids.  Medicines that make your stomach empty more quickly.  Medicines that block the production of stomach acid (H2 blockers).  Stronger medicines to reduce stomach acid (proton pump inhibitors).  You may need surgery to repair the hernia if other treatments are not helping. HOME CARE INSTRUCTIONS   Take all medicines as directed by your health care provider.  Quit smoking, if you smoke.  Try to achieve and maintain a healthy body weight.  Eat frequent small meals instead of three large meals a day. This keeps your stomach from getting too full.  Eat slowly.  Do not lie down right after eating.  Do noteat 1-2 hours before bed.   Do not drink beverages with caffeine. These include cola, coffee, cocoa, and tea.  Do not drink alcohol.  Avoid foods that can make symptoms of GERD worse. These may include:  Fatty foods.  Citrus fruits.  Other foods and drinks that contain acid.  Avoid putting pressure on your belly. Anything that puts pressure on your belly increases the amount of acid that may be pushed up  into your esophagus.   Avoid bending over, especially after eating.  Raise the head of your bed by putting blocks under the legs. This keeps your head and esophagus higher than your stomach.  Do not wear tight clothing around your chest or stomach.  Try not to strain when having a bowel movement, when urinating, or when lifting heavy objects. SEEK MEDICAL CARE IF:  Your symptoms are not  controlled with medicines or lifestyle changes.  You are having trouble swallowing.  You have coughing or wheezing that will not go away. SEEK IMMEDIATE MEDICAL CARE IF:  Your pain is getting worse.  Your pain spreads to your arms, neck, jaw, teeth, or back.  You have shortness of breath.  You sweat for no reason.  You feel sick to your stomach (nauseous) or vomit.  You vomit blood.  You have bright red blood in your stools.  You have black, tarry stools.    This information is not intended to replace advice given to you by your health care provider. Make sure you discuss any questions you have with your health care provider.   Document Released: 08/07/2003 Document Revised: 06/07/2014 Document Reviewed: 05/04/2013 Elsevier Interactive Patient Education 2016 [employer]  Hyperventilation Hyperventilation is breathing that is deeper and more rapid than normal. It is usually associated with panic and anxiety. Hyperventilation can make you feel breathless. It is sometimes called overbreathing. Breathing out too much causes a decrease in the amount of carbon dioxide gas in the blood. This leads to tingling and numbness in the hands, feet, and around the mouth. If this continues, your fingers, hands, and toes may begin to spasm. Hyperventilation usually lasts 20-30 minutes and can be associated with other symptoms of panic and anxiety, including:   Chest pains or tightness.  A pounding or irregular, racing heartbeat (palpitations).  Dizziness.  Lightheadedness.  Dry mouth.  Weakness.  Confusion.  Sleep disturbance. CAUSES  Sudden onset (acute) hyperventilation is usually triggered by acute stress, anxiety, or emotional upset. Long-term (chronic) and recurring hyperventilation can occur with chronic lung problems, such emphysema or asthma. Other causes include:   Nervousness.  Stress.  Stimulant, drug, or alcohol use.  Lung disease.  Infections, such as  pneumonia.  Heart problems.  Severe pain.  Waking from a bad dream.  Pregnancy.  Bleeding. HOME CARE INSTRUCTIONS  Learn and use breathing exercises that help you breathe from your diaphragm and abdomen.  Practice relaxation techniques to reduce stress, such as visualization, meditation, and muscle release.  During an attack, try breathing into a paper bag. This changes the carbon dioxide level and slows down breathing. SEEK IMMEDIATE MEDICAL CARE IF:  Your hyperventilation continues or gets worse. MAKE SURE YOU:  Understand these instructions.  Will watch your condition.  Will get help right away if you are not doing well or get worse.   This information is not intended to replace advice given to you by your health care provider. Make sure you discuss any questions you have with your health care provider.   Document Released: 05/14/2000 Document Revised: 11/16/2011 Document Reviewed: 08/26/2011 Elsevier Interactive Patient Education 2016 [employer]  Shortness of Breath Shortness of breath means you have trouble breathing. It could also mean that you have a medical problem. You should get immediate medical care for shortness of breath. CAUSES   Not enough oxygen in the air such as with high altitudes or a smoke-filled room.  Certain lung diseases, infections, or problems.  Heart disease or  conditions, such as angina or heart failure.  Low red blood cells (anemia).  Poor physical fitness, which can cause shortness of breath when you exercise.  Chest or back injuries or stiffness.  Being overweight.  Smoking.  Anxiety, which can make you feel like you are not getting enough air. DIAGNOSIS  Serious medical problems can often be found during your physical exam. Tests may also be done to determine why you are having shortness of breath. Tests may include:  Chest X-rays.  Lung function tests.  Blood tests.  An electrocardiogram (ECG).  An ambulatory  electrocardiogram. An ambulatory ECG records your heartbeat patterns over a 24-hour period.  Exercise testing.  A transthoracic echocardiogram (TTE). During echocardiography, sound waves are used to evaluate how blood flows through your heart.  A transesophageal echocardiogram (TEE).  Imaging scans. Your health care provider may not be able to find a cause for your shortness of breath after your exam. In this case, it is important to have a follow-up exam with your health care provider as directed.  TREATMENT  Treatment for shortness of breath depends on the cause of your symptoms and can vary greatly. HOME CARE INSTRUCTIONS   Do not smoke. Smoking is a common cause of shortness of breath. If you smoke, ask for help to quit.  Avoid being around chemicals or things that may bother your breathing, such as paint fumes and dust.  Rest as needed. Slowly resume your usual activities.  If medicines were prescribed, take them as directed for the full length of time directed. This includes oxygen and any inhaled medicines.  Keep all follow-up appointments as directed by your health care provider. SEEK MEDICAL CARE IF:   Your condition does not improve in the time expected.  You have a hard time doing your normal activities even with rest.  You have any new symptoms. SEEK IMMEDIATE MEDICAL CARE IF:   Your shortness of breath gets worse.  You feel light-headed, faint, or develop a cough not controlled with medicines.  You start coughing up blood.  You have pain with breathing.  You have chest pain or pain in your arms, shoulders, or abdomen.  You have a fever.  You are unable to walk up stairs or exercise the way you normally do. MAKE SURE YOU:  Understand these instructions.  Will watch your condition.  Will get help right away if you are not doing well or get worse.   This information is not intended to replace advice given to you by your health care provider. Make sure  you discuss any questions you have with your health care provider.   Document Released: 02/09/2001 Document Revised: 05/22/2013 Document Reviewed: 08/02/2011 Elsevier Interactive Patient Education [employer].

## 2015-12-01 NOTE — ED Notes (Signed)
Patient resting quietly. States some improvement, states not sure if it was the breathing treatment or just trying to relax that made her feel better. Husband at bedside. Now SR on monitor. Saturation at 100%.

## 2015-12-01 NOTE — ED Notes (Signed)
Patient got extremely SOB tonight at home.

## 2015-12-01 NOTE — Progress Notes (Signed)
Critical ABG results given to Dr. Beather Arbour. Orders given to reduce O2 to 2L. Reduced O2 to 2L. Pt tolerating well.

## 2015-12-01 NOTE — ED Provider Notes (Signed)
[facility] Emergency Department Provider Note   ____________________________________________  Time seen: Approximately 1:01 AM  I have reviewed the triage vital signs and the nursing notes.   HISTORY  Chief Complaint Respiratory distress   HPI Dorothy Rogers is a 65 y.o. female who presents to the ED from home via EMS with a chief complaint of respiratory distress. Patient denies history of respiratory issues. States she has had similar "attacks" previously. Describes sudden onset shortness of breath which resolved at home. Tonight she was still awake when she suddenly became short of breath. Called EMS because shortness of breath did not resolve. EMS reports room air saturations of 80% on their arrival. On nonrebreather oxygen patient is 100%. Denies recent fever, chills, chest pain, shortness of breath, cough, congestion, abdominal pain, nausea, vomiting, diarrhea. Denies recent travel or trauma. Denies use of hormones. Nothing makes her symptoms worse. Oxygen makes her symptoms better. Review of records demonstrate recent cardiac catheter 09/2015 which demonstrated the following:  1. Non flow limiting coronary artery disease (as described below)   including 50-60% RCA mid vessel stenosis, and diffuse mild LAD disease  2. Patent RCA patent stents   3. Normal left ventricular filling pressures (LVEPD = 6 mm Hg).  4. Left ventriculogram not performed, echocardiogram on 08/2015 shows   normal LV function    Past Medical History  Diagnosis Date  . Blood transfusion without reported diagnosis   . Cataract   . Hyperlipidemia   . Herpes simplex   . Coronary artery disease   . Anemia   . Vitamin B 12 deficiency   . 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 Vertigo     occasional, no episodes in 2-3 months  . Hypertension     CONTROLLED ON MEDS  . CAD (coronary artery disease) FEB AND NOV 2009    6 STENTS  . Anemia   .  Arthritis     lower back, hips  . DDD (degenerative disc disease), lumbar   . Diabetes mellitus without complication ([place])     TYPE 2  . Chronic kidney disease     Stage III  . Vertigo     OCCASIONALLY    Patient Active Problem List   Diagnosis Date Noted  . Non-intractable cyclical vomiting with nausea   . Congenital esophageal defect   . Hiatal hernia   . Gastritis   . Diarrhea 07/10/2015  . Diabetes mellitus ([place]) 07/02/2015  . Adjustment disorder 06/16/2015  . Abdominal pain 06/16/2015  . C. difficile colitis 06/16/2015  . Intractable nausea and vomiting 06/09/2015  . Dehydration 06/09/2015  . Type 2 diabetes mellitus ([place]) 06/09/2015  . HLD (hyperlipidemia) 06/09/2015  . HTN (hypertension) 06/09/2015  . CAD (coronary artery disease) 06/09/2015  . CKD (chronic kidney disease), stage III 06/09/2015  . Thrombocytopenia ([place]) 05/30/2015  . Lumbar canal stenosis 05/30/2015  . Disc disease with myelopathy, lumbar 05/30/2015  . Presence of stent in coronary artery 05/30/2015  . 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 Status post lumbar spinal fusion 12/25/2014  . DDD (degenerative disc disease), lumbar 11/18/2014  . Lumbar post-laminectomy syndrome 11/18/2014  . Facet syndrome, lumbar 11/18/2014  . Piriformis syndrome of left side 11/18/2014  . Status post lumbar spine operation 05/09/2014  . Adiposity 04/23/2014  . Thoracic and lumbosacral neuritis 03/27/2013  . H/O arthrodesis 03/27/2013  . Peripheral vascular disease ([place]) 03/22/2013  . Hypomagnesemia 03/22/2013  . Absolute anemia 03/22/2013  Past Surgical History  Procedure Laterality Date  . Cataract extraction Left   . Coronary stent placement  03/2008  . Cardiac catheterization  2009  . Eye surgery Left     cataract extraction  . Abdominal hysterectomy    . Spine surgery      2001, 2014, 2015  . Lumbar fusion  2001, 2014, 2015  . Cystectomy Right     wrist  . Tumor excision      back of neck  .  Cataract extraction w/phaco Right 03/19/2015    Procedure: CATARACT EXTRACTION PHACO AND INTRAOCULAR LENS PLACEMENT (IOC);  Surgeon: Leandrew Koyanagi, MD;  Location: [place];  Service: Ophthalmology;  Laterality: Right;  DIABETIC - insulin and oral meds  . Back surgery      Lumbar spinal fusion 05/30/2015  . Esophagogastroduodenoscopy (egd) with propofol N/A 09/04/2015    Procedure: ESOPHAGOGASTRODUODENOSCOPY (EGD) WITH PROPOFOL;  Surgeon: Lucilla Lame, MD;  Location: [place];  Service: Endoscopy;  Laterality: N/A;  INSULIN DEPENDENT DIABETIC    Current Outpatient Rx  Name  Route  Sig  Dispense  Refill  . acetaminophen (TYLENOL) 325 MG tablet   Oral   Take 650 mg by mouth every 6 (six) hours as needed for moderate pain, fever or headache.          Marland Kitchen acyclovir (ZOVIRAX) 400 MG tablet      as needed.          Marland Kitchen amLODipine (NORVASC) 5 MG tablet   Oral   Take 5 mg by mouth daily. AM         . aspirin EC 81 MG tablet   Oral   Take 81 mg by mouth at bedtime.         Marland Kitchen atorvastatin (LIPITOR) 40 MG tablet   Oral   Take 40 mg by mouth at bedtime.          . Biotin 5000 MCG TABS   Oral   Take 5,000 mcg by mouth daily. AM         . Calcium Carbonate-Vitamin D 600-400 MG-UNIT per tablet   Oral   Take 1 tablet by mouth daily. AM         . clopidogrel (PLAVIX) 75 MG tablet   Oral   Take 1 tablet (75 mg total) by mouth daily.   30 tablet   0   . Cyanocobalamin (RA VITAMIN B-12 TR) 1000 MCG TBCR   Oral   Take 1 tablet by mouth daily. AM         . ezetimibe (ZETIA) 10 MG tablet   Oral   Take 10 mg by mouth at bedtime.          . Ferrous Sulfate (IRON) 325 (65 FE) MG TABS   Oral   Take 1 tablet by mouth daily. AM         . glipiZIDE (GLUCOTROL) 10 MG tablet   Oral   Take 1 tablet by mouth 2 (two) times daily.      0   . Insulin Glargine (LANTUS SOLOSTAR) 100 UNIT/ML Solostar Pen   Subcutaneous   Inject 36 Units into the skin  daily. DEPENDING ON BLOOD SUGAR MAY TAKE AT NIGHT         . Insulin Pen Needle (ULTICARE MICRO PEN NEEDLES) 32G X 4 MM MISC               . losartan (COZAAR) 100 MG tablet   Oral  Take 100 mg by mouth daily. AM         . meclizine (ANTIVERT) 25 MG tablet   Oral   Take 25 mg by mouth 3 (three) times daily as needed for dizziness.         . ondansetron (ZOFRAN) 4 MG tablet      as needed.            Allergies Beta adrenergic blockers; Celebrex; Niacin; Niacin and related; Oxycodone; Shellfish allergy; Vicodin; and Heparin  Family History  Problem Relation Age of Onset  . Heart disease Father   . Diabetes Other     Social History Social History  Substance Use Topics  . Smoking status: Never Smoker   . Smokeless tobacco: Never Used  . Alcohol Use: No     Comment: 2-3 drinks per year    Review of Systems  Constitutional: No fever/chills. Eyes: No visual changes. ENT: No sore throat. Cardiovascular: Denies chest pain. Respiratory: Positive for shortness of breath. Gastrointestinal: No abdominal pain.  No nausea, no vomiting.  No diarrhea.  No constipation. Genitourinary: Negative for dysuria. Musculoskeletal: Negative for back pain. Skin: Negative for rash. Neurological: Negative for headaches, focal weakness or numbness.  10-point ROS otherwise negative.  ____________________________________________   PHYSICAL EXAM:  VITAL SIGNS: ED Triage Vitals  Enc Vitals Group     BP --      Pulse --      Resp --      Temp --      Temp src --      SpO2 --      Weight --      Height --      Head Cir --      Peak Flow --      Pain Score --      Pain Loc --      Pain Edu? --      Excl. in Organ? --     Constitutional: Alert and oriented. Well appearing and in moderate acute distress. Eyes: Conjunctivae are normal. PERRL. EOMI. Head: Atraumatic. Nose: No congestion/rhinnorhea. Mouth/Throat: Mucous membranes are moist.  Oropharynx  non-erythematous. Neck: No stridor.   Cardiovascular: Normal rate, regular rhythm. Grossly normal heart sounds.  Good peripheral circulation. Respiratory: Increased respiratory effort.  No retractions. Lungs slightly diminished; but otherwise CTAB. Gastrointestinal: Soft and nontender. No distention. No abdominal bruits. No CVA tenderness. Musculoskeletal: No lower extremity tenderness nor edema.  No joint effusions. Neurologic:  Normal speech and language. No gross focal neurologic deficits are appreciated.  Skin:  Skin is warm, dry and intact. No rash noted. Psychiatric: Mood and affect are normal. Speech and behavior are normal.  ____________________________________________   LABS (all labs ordered are listed, but only abnormal results are displayed)  Labs Reviewed  CBC WITH DIFFERENTIAL/PLATELET - Abnormal; Notable for the following:    WBC 11.2 (*)    RBC 3.46 (*)    Hemoglobin 10.7 (*)    HCT 31.3 (*)    Neutro Abs 8.8 (*)    All other components within normal limits  COMPREHENSIVE METABOLIC PANEL - Abnormal; Notable for the following:    CO2 21 (*)    Glucose, Bld 144 (*)    BUN 37 (*)    Creatinine, Ser 1.69 (*)    Calcium 8.7 (*)    GFR calc non Af Amer 31 (*)    GFR calc Af Amer 36 (*)    All other components within normal limits  BLOOD  GAS, ARTERIAL - Abnormal; Notable for the following:    pH, Arterial 7.67 (*)    pCO2 arterial 17 (*)    pO2, Arterial 169 (*)    All other components within normal limits  BRAIN NATRIURETIC PEPTIDE  TROPONIN I  TROPONIN I   ____________________________________________  EKG  ED ECG REPORT I, Jaqwon Manfred J, the attending physician, personally viewed and interpreted this ECG.   Date: 12/01/2015  EKG Time: 0059  Rate: 116  Rhythm: sinus tachycardia  Axis: RAD  Intervals:none  ST&T Change: Nonspecific  ____________________________________________  RADIOLOGY  Portable chest x-ray (viewed by me, interpreted per Dr.  Erin Hearing): No acute cardiopulmonary process seen.  CT chest interpreted per Dr. Marisue Humble: 1. No pulmonary embolus. 2. Moderate to large hiatal hernia with thickening of the mid and distal esophagus. 3. Coronary artery calcifications versus stents. 4. Right middle lobe sub solid pulmonary nodule measures 5 mm. This is unchanged from abdominal CT 6 months prior. Given the calcified granuloma in the right lung, this may be sequela of prior granulomatous disease, however recommend continued surveillance. Since the solid component remains <6 mm, annual CT is recommended until 5 years of stability has been established. If persistent these nodules should be considered highly suspicious if the solid component of the nodule is 6 mm or greater in size and enlarging. This recommendation follows the consensus statement: Guidelines for Management of Incidental Pulmonary Nodules Detected on CT Images:From the Fleischner Society 2017; published online before print (10.1148/radiol.IJ:2314499). ____________________________________________   PROCEDURES  Procedure(s) performed: None  Critical Care performed: No  ____________________________________________   INITIAL IMPRESSION / ASSESSMENT AND PLAN / ED COURSE  Pertinent labs & imaging results that were available during my care of the patient were reviewed by me and considered in my medical decision making (see chart for details).  65 year old female who presents with sudden onset shortness of breath. Upon her arrival, I switched her nonrebreather oxygen to 6 L nasal cannula; she is currently maintaining saturations of 100%. Will obtain screening lab work, ABG, chest x-ray and reassess.  ----------------------------------------- 2:15 AM on 12/01/2015 -----------------------------------------  ABG consistent with hyperventilation. Patient is currently resting in no acute distress. Will obtain CT chest to evaluate for pulmonary  embolism.  ----------------------------------------- 4:00 AM on 12/01/2015 -----------------------------------------  Updated patient and spouse on negative CT results. GI cocktail for hiatal hernia. Will repeat troponin.  ----------------------------------------- 4:46 AM on 12/01/2015 -----------------------------------------  Repeat troponin remains negative. Patient is resting comfortably, not tachypneic with room air saturations 100%. Strict return precautions given. Both verbalize understanding and agree with plan of care. ____________________________________________   FINAL CLINICAL IMPRESSION(S) / ED DIAGNOSES  Final diagnoses:  Hiatal hernia  Dyspnea  Hyperventilation      NEW MEDICATIONS STARTED DURING THIS VISIT:  New Prescriptions   No medications on file     Note:  This document was prepared using Dragon voice recognition software and may include unintentional dictation errors.    Paulette Blanch, MD 12/01/15 [phone]

## 2015-12-18 ENCOUNTER — Telehealth: Payer: Self-pay

## 2015-12-18 NOTE — Telephone Encounter (Signed)
Patients daughter called to set an EGD for her mother. The appointment is on 02/05/2016

## 2015-12-19 ENCOUNTER — Other Ambulatory Visit: Payer: Self-pay

## 2016-01-05 ENCOUNTER — Other Ambulatory Visit: Payer: Self-pay | Admitting: Internal Medicine

## 2016-01-05 DIAGNOSIS — Z1231 Encounter for screening mammogram for malignant neoplasm of breast: Secondary | ICD-10-CM

## 2016-01-06 ENCOUNTER — Ambulatory Visit
Admission: RE | Admit: 2016-01-06 | Discharge: 2016-01-06 | Disposition: A | Discharge status: Home / Self Care | Payer: Medicare Other | Source: Ambulatory Visit | Attending: Internal Medicine | Admitting: Internal Medicine

## 2016-01-06 ENCOUNTER — Other Ambulatory Visit: Payer: Self-pay | Admitting: Internal Medicine

## 2016-01-06 DIAGNOSIS — Z1231 Encounter for screening mammogram for malignant neoplasm of breast: Secondary | ICD-10-CM

## 2016-01-19 ENCOUNTER — Other Ambulatory Visit
Admission: RE | Admit: 2016-01-19 | Discharge: 2016-01-19 | Disposition: A | Discharge status: Home / Self Care | Payer: Medicare Other | Source: Ambulatory Visit | Attending: Pulmonary Disease | Admitting: Pulmonary Disease

## 2016-01-19 ENCOUNTER — Encounter: Payer: Self-pay | Admitting: Pulmonary Disease

## 2016-01-19 ENCOUNTER — Ambulatory Visit (INDEPENDENT_AMBULATORY_CARE_PROVIDER_SITE_OTHER): Payer: Medicare Other | Admitting: Pulmonary Disease

## 2016-01-19 VITALS — BP 132/72 | HR 85 | Ht 62.5 in | Wt 186.6 lb

## 2016-01-19 DIAGNOSIS — E873 Alkalosis: Secondary | ICD-10-CM

## 2016-01-19 DIAGNOSIS — R000 Tachycardia, unspecified: Secondary | ICD-10-CM

## 2016-01-19 LAB — TSH: TSH: 4.086 u[IU]/mL (ref 0.350–4.500)

## 2016-01-19 MED ORDER — BISOPROLOL FUMARATE 5 MG PO TABS: 5.0000 mg | ORAL_TABLET | Freq: Every day | ORAL | 5 refills | Status: DC

## 2016-01-19 MED ORDER — MIRTAZAPINE 15 MG PO TABS: 15.0000 mg | ORAL_TABLET | Freq: Every day | ORAL | 5 refills | Status: DC

## 2016-01-19 NOTE — Patient Instructions (Signed)
Bisoprolol 5 mg daily - take in morning Remeron 15 mg daily @ bedtime  Check thyroid test today  Follow up in 4-6 weeksa

## 2016-01-21 NOTE — Progress Notes (Signed)
PULMONARY CONSULT NOTE  Requesting MD/Service: Doy Hutching Date of initial consultation: 01/19/16 Reason for consultation: Dyspnea  PT PROFILE: 47 F referred for evaluation of severe episodic dyspnea X several months. She has undergone extensive cardiac evaluation and CTA chest which has been unrevealing. An ABG revealed severe primary respiratory alkalosis. 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 Initial therapy: bisoprolol and mirtazapine.   HPI:  70 F who is referred for evaluation of dyspnea of several months duration which has gradually worsened over that time. She has undergone an extensive cardiac by Dr Ubaldo Glassing which has revealed no cause for her dyspnea. She presented to the ED 0703 with severe episode of dyspnea and underwent a CTA chest which revealed no PE. There was a moderate sized HH noted. An ABG was obtained on that visit which revealed 7.67/17/169 on supplemental O2. She denies CP, fever, purulent sputum, hemoptysis, LE edema and calf tenderness. She has been tried on Symbicort and albuterol inhalers without improvement in symptoms   Past Medical History:  Diagnosis Date  . Anemia   . Anemia   . Arthritis    lower back, hips  . Blood transfusion without reported diagnosis   . CAD (coronary artery disease) FEB AND NOV 2009   6 STENTS  . Cancer ([place])    tumor back of neck-fibroushistocytoma  . Cataract   . Chronic kidney disease    Stage III  . Coronary artery disease   . DDD (degenerative disc disease), lumbar   . Diabetes mellitus without complication ([place])    TYPE 2  . Herpes simplex   . Hyperlipidemia   . Hypertension    CONTROLLED ON MEDS  . Vertigo    occasional, no episodes in 2-3 months  . Vertigo    OCCASIONALLY  . Vitamin B 12 deficiency     Past Surgical History:  Procedure Laterality Date  . ABDOMINAL HYSTERECTOMY    . BACK SURGERY     Lumbar spinal fusion 05/30/2015  . CARDIAC CATHETERIZATION  2009  . CATARACT EXTRACTION Left   .  CATARACT EXTRACTION W/PHACO Right 03/19/2015   Procedure: CATARACT EXTRACTION PHACO AND INTRAOCULAR LENS PLACEMENT ([place]);  Surgeon: Leandrew Koyanagi, MD;  Location: [place];  Service: Ophthalmology;  Laterality: Right;  DIABETIC - insulin and oral meds  . CORONARY STENT PLACEMENT  03/2008  . CYSTECTOMY Right    wrist  . ESOPHAGOGASTRODUODENOSCOPY (EGD) WITH PROPOFOL N/A 09/04/2015   Procedure: ESOPHAGOGASTRODUODENOSCOPY (EGD) WITH PROPOFOL;  Surgeon: Lucilla Lame, MD;  Location: [place];  Service: Endoscopy;  Laterality: N/A;  INSULIN DEPENDENT DIABETIC  . EYE SURGERY Left    cataract extraction  . LUMBAR FUSION  2001, 2014, 2015  . [place]     2001, 2014, 2015  . TUMOR EXCISION     back of neck    MEDICATIONS: I have reviewed all medications and confirmed regimen as documented  Social History   Social History  . Marital status: Married    Spouse name: N/A  . Number of children: N/A  . Years of education: N/A   Occupational History  . Not on file.   Social History Main Topics  . Smoking status: Never Smoker  . Smokeless tobacco: Never Used  . Alcohol use 0.0 oz/week     Comment: 2-3 drinks per year  . Drug use: No  . Sexual activity: Not on file   Other Topics Concern  . Not on file   Social History Narrative  . No narrative on  file    Family History  Problem Relation Age of Onset  . Heart disease Father   . Diabetes Other   . Breast cancer Neg Hx     ROS: No fever, myalgias/arthralgias, unexplained weight loss or weight gain No new focal weakness or sensory deficits No otalgia, hearing loss, visual changes, nasal and sinus symptoms, mouth and throat problems No neck pain or adenopathy No abdominal pain, N/V/D, diarrhea, change in bowel pattern No dysuria, change in urinary pattern   Vitals:   01/19/16 1440  BP: 132/72  Pulse: 85  SpO2: 100%  Weight: 186 lb 9.6 oz (84.6 kg)  Height: 5' 2.5" (1.588 m)     EXAM:  Gen:  WDWN, Hyperpneic, NAD HEENT: NCAT, sclera white, oropharynx normal Neck: Supple without LAN, thyromegaly, JVD Lungs: breath sounds: full, percussion: normal, No adventitious sounds Cardiovascular: RRR, no murmurs noted Abdomen: Soft, nontender, normal BS Ext: without clubbing, cyanosis, edema Neuro: CNs grossly intact, motor and sensory intact Skin: Limited exam, no lesions noted  DATA:   BMP Latest Ref Rng & Units 12/01/2015 07/11/2015 07/10/2015  Glucose 65 - 99 mg/dL 144(H) 85 174(H)  BUN 6 - 20 mg/dL 37(H) 24(H) 36(H)  Creatinine 0.44 - 1.00 mg/dL 1.69(H) 1.32(H) 1.55(H)  Sodium 135 - 145 mmol/L 140 144 140  Potassium 3.5 - 5.1 mmol/L 3.6 3.9 4.1  Chloride 101 - 111 mmol/L 109 117(H) 111  CO2 22 - 32 mmol/L 21(L) 22 20(L)  Calcium 8.9 - 10.3 mg/dL 8.7(L) 8.2(L) 8.1(L)    CBC Latest Ref Rng & Units 12/01/2015 07/10/2015 06/16/2015  WBC 3.6 - 11.0 K/uL 11.2(H) 9.0 6.5  Hemoglobin 12.0 - 16.0 g/dL 10.7(L) 10.8(L) 10.1(L)  Hematocrit 35.0 - 47.0 % 31.3(L) 32.3(L) 30.0(L)  Platelets 150 - 440 K/uL 191 190 313    CT chest 12/01/15:   Motion artifact. Moderate to large HH  IMPRESSION:     ICD-9-CM ICD-10-CM   1. Chronic hyperventilation syndrome 276.3 E87.3   2. 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 She does not report any exacerbating factors such as pain, panic extreme stress or anxiety (other than the anxiety that accompanies the sensation of dyspnea).   The [facility] noted on CT scan is not likely a cause or contributor to her symptoms unless we can invoke intermittent aspiration which doesn't seem likely @ this time  There are limited data on effective therapies in this setting. Bisoprolol has been used in one trial with some success and BZDs and SSRIs have been used with variable success  PLAN:  1) Bisoprolol 5 mg daily (I note that she is slightly tachycardic and hypertensive so we might get a dual benefit) 2) Mirtazapine 15 mg PO q HS 3)  ROV 4-6 weeks. If no benefit, consider low dose clonazepam and teach CO2 rebreathing maneuvers    Merton Border, MD PCCM service Mobile [phone] Pager [phone] 01/21/2016

## 2016-01-22 ENCOUNTER — Encounter: Payer: Self-pay | Admitting: Gastroenterology

## 2016-01-22 ENCOUNTER — Ambulatory Visit (INDEPENDENT_AMBULATORY_CARE_PROVIDER_SITE_OTHER): Payer: BLUE CROSS/BLUE SHIELD | Admitting: Gastroenterology

## 2016-01-22 ENCOUNTER — Telehealth: Payer: Self-pay

## 2016-01-22 VITALS — BP 122/63 | HR 64 | Temp 98.5°F | Ht 62.0 in | Wt 188.0 lb

## 2016-01-22 DIAGNOSIS — K449 Diaphragmatic hernia without obstruction or gangrene: Secondary | ICD-10-CM

## 2016-01-22 NOTE — Telephone Encounter (Signed)
Patient in need of a PH and Manometry study as well as a Barium Swallow.   Orders placed.  Esophageal Manometry with PH has been scheduled at [facility] on 02/04/16. Patient will call day before for arrival time.  Barium swallow study has been scheduled for 01/28/16. Patient needs to arrive at 0915am at the [facility] and having nothing by mouth after midnight prior.  Patient is scheduled to be seen in office by Dr. Adonis Huguenin on 02/13/16.

## 2016-01-28 ENCOUNTER — Ambulatory Visit
Admission: RE | Admit: 2016-01-28 | Discharge: 2016-01-28 | Disposition: A | Discharge status: Home / Self Care | Payer: Medicare Other | Source: Ambulatory Visit | Attending: General Surgery | Admitting: General Surgery

## 2016-01-28 DIAGNOSIS — K449 Diaphragmatic hernia without obstruction or gangrene: Secondary | ICD-10-CM | POA: Diagnosis not present

## 2016-02-03 ENCOUNTER — Telehealth: Payer: Self-pay | Admitting: Gastroenterology

## 2016-02-03 NOTE — Telephone Encounter (Signed)
I spoke with patient's husband at this time, however they would like to speak with Amber regarding the patient being diabetic. The patient has called scheduling to see if they could get the appointment moved to earlier tomorrow morning and was told no, not unless someone cancelled. They were instructed to not take diabetic medication, however she could take her BP medicine. Patient is worried that her sugar will drop and that she will have to cancel the procedure.   Please call patient.

## 2016-02-03 NOTE — Telephone Encounter (Signed)
Patient has an Esophageal Manometry with PH scheduled at [facility] tomorrow (02/04/16). She is diabetic and would like to speak with someone about that. Concerned that if her blood sugar drops she will need to cancel procedure. Please call today. Thank you.

## 2016-02-03 NOTE — Telephone Encounter (Signed)
Returned phone call to patient at this time.   Spoke with Dorothy Rogers from Endo and explained the situation in regards to patient's diabetes. She encouraged patient to skip Lantus and Glipizide tonight and tomorrow prior to testing. Also stated that patient may drink clear liquids up until 5am in the morning.  I explained all of this to patient's husband, he verbalized understanding and was encouraged to call with any further problems.

## 2016-02-04 ENCOUNTER — Encounter
Admission: RE | Disposition: A | Discharge status: Home / Self Care | Payer: Self-pay | Source: Ambulatory Visit | Attending: Gastroenterology

## 2016-02-04 ENCOUNTER — Ambulatory Visit
Admission: RE | Admit: 2016-02-04 | Discharge: 2016-02-04 | Disposition: A | Discharge status: Home / Self Care | Payer: Medicare Other | Source: Ambulatory Visit | Attending: Gastroenterology | Admitting: Gastroenterology

## 2016-02-04 ENCOUNTER — Telehealth: Payer: Self-pay | Admitting: General Surgery

## 2016-02-04 DIAGNOSIS — K449 Diaphragmatic hernia without obstruction or gangrene: Secondary | ICD-10-CM | POA: Diagnosis present

## 2016-02-04 DIAGNOSIS — Z538 Procedure and treatment not carried out for other reasons: Secondary | ICD-10-CM | POA: Insufficient documentation

## 2016-02-04 HISTORY — PX: 24 HOUR PH STUDY: SHX5419

## 2016-02-04 HISTORY — PX: ESOPHAGEAL MANOMETRY: SHX5429

## 2016-02-04 SURGERY — MONITORING, ESOPHAGEAL PH, 24 HOUR

## 2016-02-04 MED ORDER — LIDOCAINE HCL 2 % EX GEL
1.0000 "application " | Freq: Once | CUTANEOUS | Status: AC
  Administered 2016-02-04: 2

## 2016-02-04 MED ORDER — BUTAMBEN-TETRACAINE-BENZOCAINE 2-2-14 % EX AERO
1.0000 | INHALATION_SPRAY | Freq: Once | CUTANEOUS | Status: AC
  Administered 2016-02-04: 1 via TOPICAL

## 2016-02-04 MED ORDER — LIDOCAINE HCL 2 % EX GEL
CUTANEOUS | Status: AC
  Administered 2016-02-04: 2
  Filled 2016-02-04: qty 5

## 2016-02-04 MED ORDER — BUTAMBEN-TETRACAINE-BENZOCAINE 2-2-14 % EX AERO
INHALATION_SPRAY | CUTANEOUS | Status: AC
  Administered 2016-02-04: 1 via TOPICAL
  Filled 2016-02-04: qty 20

## 2016-02-04 SURGICAL SUPPLY — 2 items
FACESHIELD LNG OPTICON STERILE (SAFETY) IMPLANT
GLOVE BIO SURGEON STRL SZ8 (GLOVE) ×6 IMPLANT

## 2016-02-04 NOTE — Telephone Encounter (Signed)
Patient scheduled for ESOPHAGEAL MANOMETRY 24 HOUR PH STUDY today at [facility]. Kieth Brightly called from Endoscopy to advise that she was unable to get the probe to pass after multiple attempts. She states the patient has a history of NG tubes after back surgeries and there is possible scar tissue. They were unable to complete the Esophageal Manometry.

## 2016-02-05 ENCOUNTER — Encounter: Admission: RE | Payer: Self-pay | Source: Ambulatory Visit

## 2016-02-05 ENCOUNTER — Ambulatory Visit: Admission: RE | Admit: 2016-02-05 | Payer: Medicare Other | Source: Ambulatory Visit | Admitting: Gastroenterology

## 2016-02-05 ENCOUNTER — Ambulatory Visit: Admit: 2016-02-05 | Payer: Self-pay | Admitting: Gastroenterology

## 2016-02-05 SURGERY — COLONOSCOPY WITH PROPOFOL
Anesthesia: General

## 2016-02-05 SURGERY — ESOPHAGOGASTRODUODENOSCOPY (EGD) WITH PROPOFOL
Anesthesia: General

## 2016-02-06 NOTE — Telephone Encounter (Signed)
Will speak with Dr. Adonis Huguenin in regards to this today and get orders for what to do next.

## 2016-02-06 NOTE — Telephone Encounter (Signed)
Spoke with Dr. Adonis Huguenin in regards to this patient. He would like to see patient as scheduled this week and review other study and consult with patient for surgery as planned.  Patient notified to follow-up as scheduled.

## 2016-02-09 ENCOUNTER — Encounter: Payer: Self-pay | Admitting: Gastroenterology

## 2016-02-12 ENCOUNTER — Other Ambulatory Visit: Payer: Self-pay

## 2016-02-13 ENCOUNTER — Encounter: Payer: Self-pay | Admitting: General Surgery

## 2016-02-13 ENCOUNTER — Ambulatory Visit (INDEPENDENT_AMBULATORY_CARE_PROVIDER_SITE_OTHER): Payer: Medicare Other | Admitting: General Surgery

## 2016-02-13 VITALS — BP 141/78 | HR 90 | Temp 98.6°F | Ht 62.0 in | Wt 187.2 lb

## 2016-02-13 DIAGNOSIS — K449 Diaphragmatic hernia without obstruction or gangrene: Secondary | ICD-10-CM

## 2016-02-13 NOTE — Patient Instructions (Signed)
We will plan on doing your surgery on 02/26/16 at [facility] by Dr. Adonis Huguenin.  We will need to get Cardiac Clearance, Medical Clearance, and Clearance to take you off of your medications (Aspirin and Plavix) for the surgery. We will contact Dr. Doy Hutching and Dr. Ubaldo Glassing in regards to this.  Please see your [facility]) Pre-care sheet for further information.  Call with any questions or concerns that you may have.

## 2016-02-13 NOTE — Progress Notes (Signed)
Patient ID: Dorothy Rogers, female   DOB: July 10, 1950, 65 y.o.   MRN: [id number]  CC: Shortness of breath  HPI Dorothy Rogers is a 65 y.o. female who presents to clinic today for evaluation of a large hiatal hernia. Patient reports for the last 10 months she's had a sudden onset of shortness of breath. She's had an extensive workup including cardiac and pulmonary without an obvious finding other than a new, increasingly large hiatal hernia with approximately 50% of her stomach being within her chest. Patient reports that about a year ago she had a sudden onset of a gastrointestinal illness with projectile nausea and vomiting that lasted for many weeks. The shortness of breath started to develop after that. She also had spine surgery last when her that after which she developed C. difficile colitis. During the straining associated with the colitis and its treatment her shortness of breath and chest pain worsened with image findings of a worsening hiatal hernia. She has had symptoms of reflux and gas pains within her chest that it been relieved with antiacid medication and gas ex. She also received platelets having chronic burping and gas issues. She denies any fevers, chills, diarrhea, constipation currently. She has a chronic underlying chest pain with intermittent nausea and shortness of breath. She had a recent cardiac workup in July without new findings per her. She had an upper GI performed last month which confirmed the large hiatal hernia with stomach protruding into her chest. Manometry was attempted to be accomplished however the catheter was not able to be passed into her esophagus successfully.  HPI  Past Medical History:  Diagnosis Date  . Anemia   . Anemia   . Arthritis    lower back, hips  . Blood transfusion without reported diagnosis   . CAD (coronary artery disease) FEB AND NOV 2009   6 STENTS  . 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 Cataract   . Chronic  kidney disease    Stage III  . Coronary artery disease   . DDD (degenerative disc disease), lumbar   . Diabetes mellitus without complication ([place])    TYPE 2  . Herpes simplex   . Hyperlipidemia   . Hypertension    CONTROLLED ON MEDS  . Vertigo    occasional, no episodes in 2-3 months  . Vertigo    OCCASIONALLY  . Vitamin B 12 deficiency     Past Surgical History:  Procedure Laterality Date  . [place] STUDY N/A 02/04/2016   Procedure: [place] STUDY;  Surgeon: Lucilla Lame, MD;  Location: [facility] ENDOSCOPY;  Service: Endoscopy;  Laterality: N/A;  . ABDOMINAL HYSTERECTOMY    . BACK SURGERY     Lumbar spinal fusion 05/30/2015  . CARDIAC CATHETERIZATION  2009  . CATARACT EXTRACTION Left   . CATARACT EXTRACTION W/PHACO Right 03/19/2015   Procedure: CATARACT EXTRACTION PHACO AND INTRAOCULAR LENS PLACEMENT ([place]);  Surgeon: Leandrew Koyanagi, MD;  Location: [facility];  Service: Ophthalmology;  Laterality: Right;  DIABETIC - insulin and oral meds  . CORONARY STENT PLACEMENT  03/2008  . CYSTECTOMY Right    wrist  . ESOPHAGEAL MANOMETRY N/A 02/04/2016   Procedure: ESOPHAGEAL MANOMETRY (EM);  Surgeon: Lucilla Lame, MD;  Location: [facility] ENDOSCOPY;  Service: Endoscopy;  Laterality: N/A;  . ESOPHAGOGASTRODUODENOSCOPY (EGD) WITH PROPOFOL N/A 09/04/2015   Procedure: ESOPHAGOGASTRODUODENOSCOPY (EGD) WITH PROPOFOL;  Surgeon: Lucilla Lame, MD;  Location: [facility];  Service: Endoscopy;  Laterality:  N/A;  INSULIN DEPENDENT DIABETIC  . EYE SURGERY Left    cataract extraction  . LUMBAR FUSION  2001, 2014, 2015  . [place]     2001, 2014, 2015  . TUMOR EXCISION     back of neck    Family History  Problem Relation Age of Onset  . Heart disease Father   . Diabetes Other   . Breast cancer Neg Hx     Social History Social History  Substance Use Topics  . Smoking status: Never Smoker  . Smokeless tobacco: Never Used  . Alcohol use 0.0 oz/week     Comment: 2-3 drinks  per year    Allergies  Allergen Reactions  . Beta Adrenergic Blockers Hives  . Celebrex [Celecoxib] Other (See Comments)    A lot of pressure in head  . Niacin Other (See Comments)    flushing  . Niacin And Related Other (See Comments)    Flushing and hot sensation  . Oxycodone Nausea Only  . Shellfish Allergy Nausea And Vomiting  . Vicodin [Hydrocodone-Acetaminophen] Other (See Comments)    Hypoglycemic episodes  . Heparin Rash    Current Outpatient Prescriptions  Medication Sig Dispense Refill  . acetaminophen (TYLENOL) 325 MG tablet Take 650 mg by mouth every 6 (six) hours as needed for moderate pain, fever or headache.     Marland Kitchen acyclovir (ZOVIRAX) 400 MG tablet as needed.     Marland Kitchen amLODipine (NORVASC) 5 MG tablet Take 5 mg by mouth daily. AM    . aspirin EC 81 MG tablet Take 81 mg by mouth at bedtime.    Marland Kitchen atorvastatin (LIPITOR) 40 MG tablet Take 40 mg by mouth at bedtime.     . Biotin 5000 MCG TABS Take 5,000 mcg by mouth daily. AM    . Calcium Carbonate-Vitamin D 600-400 MG-UNIT per tablet Take 1 tablet by mouth daily. AM    . clopidogrel (PLAVIX) 75 MG tablet Take 1 tablet (75 mg total) by mouth daily. 30 tablet 0  . Cyanocobalamin (RA VITAMIN B-12 TR) 1000 MCG TBCR Take 1 tablet by mouth daily. AM    . Ferrous Sulfate (IRON) 325 (65 FE) MG TABS Take 1 tablet by mouth daily. AM    . glipiZIDE (GLUCOTROL) 10 MG tablet Take 1 tablet by mouth 2 (two) times daily.  0  . Insulin Glargine (LANTUS SOLOSTAR) 100 UNIT/ML Solostar Pen Inject 36 Units into the skin daily. DEPENDING ON BLOOD SUGAR MAY TAKE AT NIGHT    . Insulin Pen Needle (ULTICARE MICRO PEN NEEDLES) 32G X 4 MM MISC     . losartan (COZAAR) 100 MG tablet Take 100 mg by mouth daily. AM     No current facility-administered medications for this visit.      Review of Systems A Multi-point review of systems was asked and was negative except for the findings documented in the history of present illness  Physical Exam Blood  pressure (!) 141/78, pulse 90, temperature 98.6 F (37 C), temperature source Oral, height 5\' 2"  (1.575 m), weight 84.9 kg (187 lb 3.2 oz). CONSTITUTIONAL: No acute distress. EYES: Pupils are equal, round, and reactive to light, Sclera are non-icteric. EARS, NOSE, MOUTH AND THROAT: The oropharynx is clear. The oral mucosa is pink and moist. Hearing is intact to voice. LYMPH NODES:  Lymph nodes in the neck are normal. RESPIRATORY:  Lungs are clear. There is normal respiratory effort, with equal breath sounds bilaterally, and without pathologic use of accessory muscles. CARDIOVASCULAR:  Heart is regular without murmurs, gallops, or rubs. GI: The abdomen is soft, nontender, and nondistended. There are no palpable masses. There is no hepatosplenomegaly. There are normal bowel sounds in all quadrants. Well-healed lower midline incision. GU: Rectal deferred.   MUSCULOSKELETAL: Normal muscle strength and tone. No cyanosis or edema.   SKIN: Turgor is good and there are no pathologic skin lesions or ulcers. NEUROLOGIC: Motor and sensation is grossly normal. Cranial nerves are grossly intact. PSYCH:  Oriented to person, place and time. Affect is normal.  Data Reviewed Barium swallow reviewed which as stated in the history of present illness shows the top 50% of her stomach residing within her chest. The barium does pass with ease so there is no evidence of obstruction. There is reflux of the barium contrast during the study. Recent EGD reviewed which also shows a large hiatal hernia. I have personally reviewed the patient's imaging, laboratory findings and medical records.    Assessment    Symptomatic hiatal hernia versus paraesophageal hernia    Plan    65 year old female with multiple medical problems with what appears to be a symptomatic hiatal hernia that is causing both worsening reflux symptoms as well as some of her shortness of breath due to chest displacement by the stomach. Discussed at  length with her about the surgical procedure of a laparoscopic hiatal/paraesophageal hernia repair where her stomach is pulled from her chest into her abdomen as well as the repair of the hiatal hernia, the pexy of her stomach to the abdominal wall, the anti-reflux partial wrap secondary to the inability to obtain manometry. Discussed at length that her risks from the surgery area and increase due to her cardiac history, her C. difficile history, her pulmonary history. Discussed that we would require medical, cardiac clearance prior to surgical intervention. However, patient has had recent visits with all the subspecialists and clearance should be easy to obtain. We will tentatively plan for surgical intervention for September 28 with Dr. Dahlia Byes to assist. All questions answered to the patient's satisfaction. She understands that if any subspecialists feel she needs to be cared for at a tertiary care center she'll be referred [facility].     Time spent with the patient was 60 minutes, with more than 50% of the time spent in face-to-face education, counseling and care coordination.     Clayburn Pert, MD FACS General Surgeon 02/13/2016, 9:45 AM

## 2016-02-16 ENCOUNTER — Telehealth: Payer: Self-pay | Admitting: General Surgery

## 2016-02-16 NOTE — Telephone Encounter (Signed)
Pt advised of pre op date/time and sx date. Sx: 02/26/16 with Dr Adonis Huguenin, Dr Dahlia Byes assisting--Laparoscopic paraesophageal hernia repair.  Pre op: 02/19/16 @ 11:30am--office.   Patient made aware to call [phone], between 1-3:00pm the day before surgery, to find out what time to arrive.

## 2016-02-19 ENCOUNTER — Other Ambulatory Visit: Payer: Medicare Other

## 2016-02-19 ENCOUNTER — Telehealth: Payer: Self-pay | Admitting: General Surgery

## 2016-02-19 ENCOUNTER — Encounter
Admission: RE | Admit: 2016-02-19 | Discharge: 2016-02-19 | Disposition: A | Discharge status: Home / Self Care | Payer: Medicare Other | Source: Ambulatory Visit | Attending: General Surgery | Admitting: General Surgery

## 2016-02-19 ENCOUNTER — Other Ambulatory Visit: Payer: Self-pay

## 2016-02-19 DIAGNOSIS — Z01812 Encounter for preprocedural laboratory examination: Secondary | ICD-10-CM | POA: Diagnosis not present

## 2016-02-19 DIAGNOSIS — K449 Diaphragmatic hernia without obstruction or gangrene: Secondary | ICD-10-CM

## 2016-02-19 LAB — BASIC METABOLIC PANEL
ANION GAP: 6 (ref 5–15)
BUN: 31 mg/dL — ABNORMAL HIGH (ref 6–20)
CALCIUM: 9.2 mg/dL (ref 8.9–10.3)
CHLORIDE: 109 mmol/L (ref 101–111)
CO2: 24 mmol/L (ref 22–32)
Creatinine, Ser: 1.61 mg/dL — ABNORMAL HIGH (ref 0.44–1.00)
GFR calc non Af Amer: 33 mL/min — ABNORMAL LOW (ref >60)
GFR, EST AFRICAN AMERICAN: 38 mL/min — AB (ref >60)
Glucose, Bld: 107 mg/dL — ABNORMAL HIGH (ref 65–99)
Potassium: 4.5 mmol/L (ref 3.5–5.1)
Sodium: 139 mmol/L (ref 135–145)

## 2016-02-19 LAB — CBC
HEMATOCRIT: 33.9 % — AB (ref 35.0–47.0)
HEMOGLOBIN: 11.5 g/dL — AB (ref 12.0–16.0)
MCH: 30.8 pg (ref 26.0–34.0)
MCHC: 34 g/dL (ref 32.0–36.0)
MCV: 90.6 fL (ref 80.0–100.0)
Platelets: 213 10*3/uL (ref 150–440)
RBC: 3.74 MIL/uL — ABNORMAL LOW (ref 3.80–5.20)
RDW: 13 % (ref 11.5–14.5)
WBC: 6.6 10*3/uL (ref 3.6–11.0)

## 2016-02-19 NOTE — Telephone Encounter (Signed)
Patient went to her Pre-Admit appointment today. She was told to talk to [employer] - she has some questions about her upcoming surgery with Dr Adonis Huguenin on 9/28. LAPAROSCOPIC PARAESOPHAGEAL HERNIA REPAIR

## 2016-02-19 NOTE — Pre-Procedure Instructions (Deleted)
ANESTHESIA - ED ECG INTERPRETATION, MOST RECENT ECHO FROM [facility] CARDIOLOGY 01/2016  ED ECG REPORT I, SUNG,JADE J, the attending physician, personally viewed and interpreted this ECG.   Date: 12/01/2015  EKG Time: 0059  Rate: 116  Rhythm: sinus tachycardia  Axis: RAD  Intervals:none  ST&T Change: Nonspecific    ECG 12 Lead5/02/2016 [facility]  Component Name Value Ref Range  EKG Ventricular Rate 76 BPM   EKG Atrial Rate 76 BPM   EKG P-R Interval 160 ms  EKG QRS Duration 82 ms  EKG Q-T Interval 390 ms  EKG QTC Calculation 438 ms  EKG Calculated P Axis 60 degrees   EKG Calculated R Axis 36 degrees   EKG Calculated T Axis 16 degrees   Result Narrative  NORMAL SINUS RHYTHM NORMAL ECG WHEN COMPARED WITH ECG OF 07-Oct-2015 13:35, NO SIGNIFICANT CHANGE WAS FOUND RATE HAS DECREASED Confirmed by Ishmael Holter MD, Charles [phone]) on 10/08/2015 11:00:47 AM     eCHO REPORT   Component Name Value Ref Range  LV Ejection Fraction (%) 55   Aortic Valve Stenosis Grade none   Aortic Valve Regurgitation Grade trivial   Mitral Valve Stenosis Grade none   Mitral Valve Regurgitation Grade trivial   Tricuspid Valve Regurgitation Grade mild   Tricuspid Valve Regurgitation Max Velocity (m/s) 2.3 m/sec   Right Ventricle Systolic Pressure (mmHg) 0000000 mmHg   LV End Diastolic Diameter (cm) 4.3 cm  LV End Systolic Diameter (cm) 2.8 cm  LV Septum Wall Thickness (cm) 0.9 cm  LV Posterior Wall Thickness (cm) 0.8 cm  Left Atrium Diameter (cm) 3.9 cm  Result Narrative  INTERNAL MEDICINE DEPARTMENTHENSON, Dorothy KELM [id number] [facility]INE PRACTICEAcct #: [id number] [address], [place] [address] Date: 02/10/2016 01:04 PM Adult Female Age: 65 yrs ECHOCARDIOGRAM  REPORT Outpatient  STUDY:CHEST WALL TAPE:0000:00: 0:00:00 KC::KCWI ECHO:Yes DOPPLER:YesFILE:0000-000-000 MD1:SPARKS, JEFFREY D  COLOR:YesCONTRAST:No MACHINE:Philips Height: 62 in  RV BIOPSY:No 3D:NoSOUND QLTY:ModerateWeight: 187 lb MEDIUM:None BSA: 1.9 m2  ___________________________________________________________________________________________  HISTORY:DOE REASON:Assess, LV function INDICATION:DOE (dyspnea on exertion) [R06.09 (ICD-10-CM)]  ___________________________________________________________________________________________ ECHOCARDIOGRAPHIC MEASUREMENTS 2D DIMENSIONS AORTA ValuesNormal RangeMAIN PAValuesNormal Range Annulus:nm* [2.1 - 2.5]PA Main:nm* [1.5 - 2.1] Aorta Sin:nm* [2.7 - 3.3] RIGHT VENTRICLE ST Junction:nm* [2.3 - 2.9]RV Base:nm* [ < 4.2] Asc.Aorta:nm* [2.3 - 3.1] RV Mid:nm* [ < 3.5]  LEFT VENTRICLERV Length:nm* [ < 8.6] LVIDd:4.3 cm[3.9 - 5.3] INFERIOR VENA CAVA LVIDs:2.8 cmMax. IVC:nm* [ <= 2.1]  FS:34.9 %[> 25]Min. IVC:nm* SWT:0.90 cm [0.5 - 0.9] ------------------ PWT:0.80 cm [0.5 - 0.9] nm* - not measured  LEFT ATRIUM LA Diam:3.9 cm[2.7 - 3.8] LA A4C Area:nm* [ < 20] LA Volume:nm* [22 - 52] ___________________________________________________________________________________________  ECHOCARDIOGRAPHIC DESCRIPTIONS  AORTIC  ROOT Size:Normal Dissection:INDETERM FOR DISSECTION  AORTIC VALVE Leaflets:Tricuspid Morphology:Normal Mobility:Fully mobile  LEFT VENTRICLE Size:NormalAnterior:Normal  Contraction:Normal Lateral:Normal Closest EF:>55% (Estimated)Septal:Normal  LV Masses:No Masses Apical:Normal  [id number] Posterior:Normal Dias.FxClass:(Grade 1) relaxation abnormal, E/A reversal  MITRAL VALVE Leaflets:NormalMobility:Fully mobile Morphology:Normal  LEFT ATRIUM Size:Normal LA Masses:No masses  IA Septum:Normal IAS  MAIN PA Size:Normal  PULMONIC VALVE Morphology:NormalMobility:Fully mobile  RIGHT VENTRICLE  RV Masses:No Masses Size:Normal  Free Wall:Normal Contraction:Normal  TRICUSPID VALVE Leaflets:NormalMobility:Fully mobile Morphology:Normal  RIGHT ATRIUM Size:NormalRA Other:None  RA Mass:No masses  PERICARDIUM  Fluid:No effusion  INFERIOR VENACAVA Size:Normal Normal respiratory collapse  ____________________________________________________________________  DOPPLER ECHO and OTHER SPECIAL PROCEDURES  Aortic:TRIVIAL ARNo AS   Mitral:TRIVIAL MRNo MS MV Inflow E Vel=88.8 cm/sec MV Annulus E'Vel=6.4 cm/sec E/E'Ratio=13.8  Tricuspid:MILD TR No TS 227.0 cm/sec  peak TR vel25.6 mmHg peak RV pressure  Pulmonary:MILD PR No PS    ___________________________________________________________________________________________  INTERPRETATION NORMAL LEFT VENTRICULAR SYSTOLIC FUNCTION NORMAL RIGHT VENTRICULAR SYSTOLIC  FUNCTION MILD VALVULAR REGURGITATION (See above) NO VALVULAR STENOSIS Closest EF: >55% (Estimated) Mitral: TRIVIAL MR Aortic: TRIVIAL AR Tricuspid: MILD TR  ___________________________________________________________________________________________  Electronically signed by: MD Jordan Hawks on 02/11/2016 08:17 AM Performed By: Scherrie November, RCS Ordering Physician: Doy Hutching, JEFFREY ___________________________________________________________________________________________  Status

## 2016-02-19 NOTE — Telephone Encounter (Signed)
Returned phone call to patient at this time. Spoke with patient's husband. They have not been notified when to stop plavix or ASA. I explained that I would touch base with Dr. Ubaldo Glassing in regards to this and get back in touch with patient.  There is also concern that surgery is scheduled so late in the day and she is a diabetic. I did explain that she would need to let OR staff know about this when she calls to get her arrival time so that they can tell her what to do in regards to this.

## 2016-02-19 NOTE — Pre-Procedure Instructions (Signed)
After reviewing medical history Dr Amie Critchley has requested cardiac clearance. Faxed request to dr Lamont Snowball office and notified Amy of request.

## 2016-02-19 NOTE — Patient Instructions (Signed)
Your procedure is scheduled on: Thursday 02/26/16 Report to Day Surgery. 2ND FLOOR MEDICAL MALL ENTRANCE To find out your arrival time please call [phone] between 1PM - 3PM on Wednesday 02/25/16.  Remember: Instructions that are not followed completely may result in serious medical risk, up to and including death, or upon the discretion of your surgeon and anesthesiologist your surgery may need to be rescheduled.    __X__ 1. Do not eat food or drink liquids after midnight. No gum chewing or hard candies.     __X__ 2. No Alcohol for 24 hours before or after surgery.   ____ 3. Bring all medications with you on the day of surgery if instructed.    __X__ 4. Notify your doctor if there is any change in your medical condition     (cold, fever, infections).     Do not wear jewelry, make-up, hairpins, clips or nail polish.  Do not wear lotions, powders, or perfumes.   Do not shave 48 hours prior to surgery. Men may shave face and neck.  Do not bring valuables to the hospital.    [facility] is not responsible for any belongings or valuables.               Contacts, dentures or bridgework may not be worn into surgery.  Leave your suitcase in the car. After surgery it may be brought to your room.  For patients admitted to the hospital, discharge time is determined by your                treatment team.   Patients discharged the day of surgery will not be allowed to drive home.   Please read over the following fact sheets that you were given:   MRSA Information   __X__ Take these medicines the morning of surgery with A SIP OF WATER:    1. AMLODIPINE  2. LOSARTAN  3.   4.  5.  6.  ____ Fleet Enema (as directed)   __X__ Use CHG Soap as directed  ____ Use inhalers on the day of surgery  ____ Stop metformin 2 days prior to surgery    ____ Take 1/2 of usual insulin dose the night before surgery and none on the morning of surgery.   __X__ Stop Coumadin/Plavix/aspirin on AS  DIRECTED BY MD  ____ Stop Anti-inflammatories on    __X__ Stop supplements until after surgery.    ____ Bring C-Pap to the hospital.

## 2016-02-20 NOTE — Telephone Encounter (Signed)
Spoke with Gwinda Passe at Dr. Bethanne Ginger office at this time. Clearance has been faxed and asked her to please make this an urgent matter as patient's surgery is on 02/26/16 and she is currently on Plavix and ASA which will need to be stopped soon for surgery.

## 2016-02-20 NOTE — Pre-Procedure Instructions (Signed)
Met B and CBC results sent to Dr. Adonis Huguenin and Anesthesia for review.

## 2016-02-23 ENCOUNTER — Encounter: Payer: Self-pay | Admitting: *Deleted

## 2016-02-23 ENCOUNTER — Telehealth: Payer: Self-pay

## 2016-02-23 NOTE — Telephone Encounter (Signed)
Unable to obtain clearance from Dr. Ubaldo Glassing. Called patient to advise her of this. We have moved her surgery to a tentative date of 03/11/16 with Dr. Adonis Huguenin and Dr. Genevive Bi assisting.   Patient was upset to hear this but will follow-up with Dr. Ubaldo Glassing in order to gain cardiology clearance.   Will follow-up with Dr. Bethanne Ginger office in regards to clearance for next scheduled surgery date.

## 2016-02-23 NOTE — Telephone Encounter (Signed)
Clearance to Stop Anti-coagulant for surgery received at this time from Dr.Fath. Patient has permission to stop 7 days for surgery.

## 2016-02-24 ENCOUNTER — Encounter: Payer: Self-pay | Admitting: Pulmonary Disease

## 2016-02-24 ENCOUNTER — Ambulatory Visit (INDEPENDENT_AMBULATORY_CARE_PROVIDER_SITE_OTHER): Payer: Medicare Other | Admitting: Pulmonary Disease

## 2016-02-24 VITALS — BP 140/82 | HR 100 | Ht 62.5 in | Wt 184.2 lb

## 2016-02-24 DIAGNOSIS — F458 Other somatoform disorders: Secondary | ICD-10-CM | POA: Diagnosis not present

## 2016-02-24 DIAGNOSIS — R0600 Dyspnea, unspecified: Secondary | ICD-10-CM

## 2016-02-24 MED ORDER — SERTRALINE HCL 50 MG PO TABS: 50.0000 mg | ORAL_TABLET | Freq: Every day | ORAL | 5 refills | Status: DC

## 2016-02-24 MED ORDER — UMECLIDINIUM-VILANTEROL 62.5-25 MCG/INH IN AEPB: 1.0000 | INHALATION_SPRAY | Freq: Every day | RESPIRATORY_TRACT | 0 refills | Status: AC

## 2016-02-24 MED ORDER — UMECLIDINIUM-VILANTEROL 62.5-25 MCG/INH IN AEPB: 1.0000 | INHALATION_SPRAY | Freq: Every day | RESPIRATORY_TRACT | 5 refills | Status: DC

## 2016-02-24 NOTE — Telephone Encounter (Signed)
The cardiac clearance form needs to state that. It was sent medical clearance. Please resend.

## 2016-02-24 NOTE — Patient Instructions (Signed)
1) Trial of Anoro inhaler 0 one inhalation daily 2) trial of Zoloft 50 mg daily at dinner time 3) follow up in 3 weeks

## 2016-02-25 NOTE — Progress Notes (Signed)
Appointment cancelled

## 2016-02-26 NOTE — Telephone Encounter (Signed)
Error

## 2016-02-27 ENCOUNTER — Telehealth: Payer: Self-pay

## 2016-02-27 NOTE — Telephone Encounter (Signed)
Called Dorothy Rogers back and told him that we had faxed cardiac clearance to Dr. Bethanne Ginger office and now we are awaiting on his response. I also told Dorothy Rogers that once we had cardiac clearance, that I would call him to let him know. Dorothy Rogers agreed and had no further questions.

## 2016-02-27 NOTE — Telephone Encounter (Signed)
Cardiac Clearance faxed to Dr.Fath at this time.

## 2016-02-27 NOTE — Telephone Encounter (Signed)
Patient's husband [name]) called asking if by any chance we had received cardiac clearance from Dr. Ubaldo Glassing. I told him that looking at her chart and telephone notes, I only see that we had received an okay from Dr. Bethanne Ginger office stating that patient could stop her blood thinner and aspirin 7 days prior. I told Mr. Dierker that we would have to send a cardiac clearance form to see if we could do surgery on her soon. This was explained to Mr. Reints and he understood.

## 2016-02-29 DIAGNOSIS — J986 Disorders of diaphragm: Secondary | ICD-10-CM

## 2016-02-29 HISTORY — PX: HIATAL HERNIA REPAIR: SHX195

## 2016-02-29 HISTORY — DX: Disorders of diaphragm: J98.6

## 2016-03-01 NOTE — Telephone Encounter (Signed)
Cardiac Clearance obtained at this time from Dr.Fath.

## 2016-03-01 NOTE — Progress Notes (Signed)
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 Does not believe that there is any improvement. 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 She developed LE edema and, uncertain which medication might be contributing to this, stopped both meds. No changes overall. No new complaints. Hiatal hernia repair is planned for 03/11/16 Dorothy Rogers). Denies CP, fever, purulent sputum, hemoptysis, LE edema and calf tenderness  OBJ: Vitals:   02/24/16 1404  BP: 140/82  Pulse: 100  SpO2: 94%  Weight: 184 lb 3.2 oz (83.6 kg)  Height: 5' 2.5" (1.588 m)   NAD (previous hyperpnea not noted on current exam) No JVD noted Minimal R basilar crackles. No wheezes RRR s M Obese, soft, NT No LE edema   DATA: Spirometry 02/24/16: FVC 2.72 (91%), FEV1 1.93 (85%), FEV1/FVC 71%  - Borderline obstruction  IMPRESSION: Dyspnea of unclear etiology - previous evaluation and prior ABG c/w primary hyperventilation syndrome Borderline obstruction on spirometry  PLAN: 1) Trial of Zoloft 50 mg PO q HS 2) Trial Anoro - one inhalation daily 3) OK to proceed with HH repair as planned 4) ROV 4 weeks   Merton Border, MD PCCM service Mobile [phone] Pager [phone] 03/01/2016

## 2016-03-02 ENCOUNTER — Emergency Department: Payer: Medicare Other

## 2016-03-02 ENCOUNTER — Emergency Department
Admission: EM | Admit: 2016-03-02 | Discharge: 2016-03-02 | Disposition: A | Discharge status: Home / Self Care | Payer: Medicare Other | Attending: Emergency Medicine | Admitting: Emergency Medicine

## 2016-03-02 ENCOUNTER — Encounter: Payer: Self-pay | Admitting: Emergency Medicine

## 2016-03-02 DIAGNOSIS — I2510 Atherosclerotic heart disease of native coronary artery without angina pectoris: Secondary | ICD-10-CM | POA: Insufficient documentation

## 2016-03-02 DIAGNOSIS — Z7982 Long term (current) use of aspirin: Secondary | ICD-10-CM | POA: Diagnosis not present

## 2016-03-02 DIAGNOSIS — I129 Hypertensive chronic kidney disease with stage 1 through stage 4 chronic kidney disease, or unspecified chronic kidney disease: Secondary | ICD-10-CM | POA: Diagnosis not present

## 2016-03-02 DIAGNOSIS — Z79899 Other long term (current) drug therapy: Secondary | ICD-10-CM | POA: Insufficient documentation

## 2016-03-02 DIAGNOSIS — Z85831 Personal history of malignant neoplasm of soft tissue: Secondary | ICD-10-CM | POA: Diagnosis not present

## 2016-03-02 DIAGNOSIS — G8929 Other chronic pain: Secondary | ICD-10-CM | POA: Insufficient documentation

## 2016-03-02 DIAGNOSIS — E1122 Type 2 diabetes mellitus with diabetic chronic kidney disease: Secondary | ICD-10-CM | POA: Insufficient documentation

## 2016-03-02 DIAGNOSIS — Z791 Long term (current) use of non-steroidal anti-inflammatories (NSAID): Secondary | ICD-10-CM | POA: Diagnosis not present

## 2016-03-02 DIAGNOSIS — M1611 Unilateral primary osteoarthritis, right hip: Secondary | ICD-10-CM | POA: Diagnosis not present

## 2016-03-02 DIAGNOSIS — N183 Chronic kidney disease, stage 3 (moderate): Secondary | ICD-10-CM | POA: Diagnosis not present

## 2016-03-02 DIAGNOSIS — M25551 Pain in right hip: Secondary | ICD-10-CM

## 2016-03-02 DIAGNOSIS — Z794 Long term (current) use of insulin: Secondary | ICD-10-CM | POA: Diagnosis not present

## 2016-03-02 MED ORDER — TRAMADOL HCL 50 MG PO TABS
50.0000 mg | ORAL_TABLET | Freq: Once | ORAL | Status: AC
  Administered 2016-03-02: 50 mg via ORAL
  Filled 2016-03-02: qty 1

## 2016-03-02 MED ORDER — ONDANSETRON HCL 4 MG PO TABS: 4.0000 mg | ORAL_TABLET | Freq: Every day | ORAL | 1 refills | Status: DC | PRN

## 2016-03-02 MED ORDER — ONDANSETRON 4 MG PO TBDP
4.0000 mg | ORAL_TABLET | Freq: Once | ORAL | Status: AC
  Administered 2016-03-02: 4 mg via ORAL
  Filled 2016-03-02: qty 1

## 2016-03-02 MED ORDER — ACETAMINOPHEN-CODEINE #3 300-30 MG PO TABS: 1.0000 | ORAL_TABLET | Freq: Four times a day (QID) | ORAL | 0 refills | Status: DC | PRN

## 2016-03-02 NOTE — ED Provider Notes (Signed)
[facility] Emergency Department Provider Note  ____________________________________________   First MD Initiated Contact with Patient 03/02/16 1457     (approximate)  I have reviewed the triage vital signs and the nursing notes.   HISTORY  Chief Complaint Hip Pain   HPI Dorothy Rogers is a 65 y.o. female who presents with right hip pain since Sunday. Patient describes pain as severe and constant, exacerbated by movement and weight bearing, improved by laying still on her left side. She has difficulty characterizing the pain, but denies that it is sharp, burning, or throbbing. Patient states that pain is worst in the morning when first getting up. Denies any trauma or falls. She has tried ibuprofen and extra strength tylenol without relief. Has not tried ice or heat therapy because that has not worked for her in the past. Pain has not worsened over the last 2 days, however she is concerned that it has not resolved. Patient has a history of spinal fusion and back pain. She is able to walk currently but limited distances due to pain. Patient reports also that she has decreased kidney function and a planned surgery coming up for hiatal hernia repair.    Past Medical History:  Diagnosis Date  . Anemia   . Anemia   . Arthritis    lower back, hips  . Blood transfusion without reported diagnosis   . CAD (coronary artery disease) FEB AND NOV 2009   6 STENTS  . 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 Cataract   . Chronic kidney disease    Stage III  . Coronary artery disease   . DDD (degenerative disc disease), lumbar   . Diabetes mellitus without complication ([place])    TYPE 2  . Herpes simplex   . Hyperlipidemia   . Hypertension    CONTROLLED ON MEDS  . Vertigo    occasional, no episodes in 2-3 months  . Vertigo    OCCASIONALLY  . Vitamin B 12 deficiency     Patient Active Problem List   Diagnosis Date Noted  . Non-intractable  cyclical vomiting with nausea   . Congenital esophageal defect   . Hiatal hernia   . Gastritis   . Diarrhea 07/10/2015  . Diabetes mellitus ([place]) 07/02/2015  . Adjustment disorder 06/16/2015  . Abdominal pain 06/16/2015  . C. difficile colitis 06/16/2015  . Intractable nausea and vomiting 06/09/2015  . Dehydration 06/09/2015  . Type 2 diabetes mellitus ([place]) 06/09/2015  . HLD (hyperlipidemia) 06/09/2015  . HTN (hypertension) 06/09/2015  . CAD (coronary artery disease) 06/09/2015  . CKD (chronic kidney disease), stage III 06/09/2015  . Thrombocytopenia ([place]) 05/30/2015  . Lumbar canal stenosis 05/30/2015  . Disc disease with myelopathy, lumbar 05/30/2015  . Presence of stent in coronary artery 05/30/2015  . 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 Status post lumbar spinal fusion 12/25/2014  . DDD (degenerative disc disease), lumbar 11/18/2014  . Lumbar post-laminectomy syndrome 11/18/2014  . Facet syndrome, lumbar 11/18/2014  . Piriformis syndrome of left side 11/18/2014  . Status post lumbar spine operation 05/09/2014  . Adiposity 04/23/2014  . Thoracic and lumbosacral neuritis 03/27/2013  . H/O arthrodesis 03/27/2013  . Peripheral vascular disease ([place]) 03/22/2013  . Hypomagnesemia 03/22/2013  . Absolute anemia 03/22/2013    Past Surgical History:  Procedure Laterality Date  . [place] STUDY N/A 02/04/2016   Procedure: [place] STUDY;  Surgeon: Lucilla Lame, MD;  Location: [facility] ENDOSCOPY;  Service: Endoscopy;  Laterality: N/A;  . ABDOMINAL HYSTERECTOMY    . BACK SURGERY     Lumbar spinal fusion 05/30/2015  . CARDIAC CATHETERIZATION  2009  . CATARACT EXTRACTION Left   . CATARACT EXTRACTION W/PHACO Right 03/19/2015   Procedure: CATARACT EXTRACTION PHACO AND INTRAOCULAR LENS PLACEMENT ([place]);  Surgeon: Leandrew Koyanagi, MD;  Location: [place];  Service: Ophthalmology;  Laterality: Right;  DIABETIC - insulin and oral meds  . CORONARY STENT PLACEMENT   03/2008  . CYSTECTOMY Right    wrist  . ESOPHAGEAL MANOMETRY N/A 02/04/2016   Procedure: ESOPHAGEAL MANOMETRY (EM);  Surgeon: Lucilla Lame, MD;  Location: [facility] ENDOSCOPY;  Service: Endoscopy;  Laterality: N/A;  . ESOPHAGOGASTRODUODENOSCOPY (EGD) WITH PROPOFOL N/A 09/04/2015   Procedure: ESOPHAGOGASTRODUODENOSCOPY (EGD) WITH PROPOFOL;  Surgeon: Lucilla Lame, MD;  Location: [place];  Service: Endoscopy;  Laterality: N/A;  INSULIN DEPENDENT DIABETIC  . EYE SURGERY Left    cataract extraction  . LUMBAR FUSION  2001, 2014, 2015  . [place]     2001, 2014, 2015  . TUMOR EXCISION     back of neck    Prior to Admission medications   Medication Sig Start Date End Date Taking? Authorizing Provider  acetaminophen (TYLENOL) 325 MG tablet Take 650 mg by mouth every 6 (six) hours as needed for moderate pain, fever or headache.     Historical Provider, MD  acetaminophen-codeine (TYLENOL #3) 300-30 MG tablet Take 1-2 tablets by mouth every 6 (six) hours as needed for moderate pain. 03/02/16   Pierce Crane [place], PA-C  acyclovir (ZOVIRAX) 400 MG tablet as needed.  08/07/15   Historical Provider, MD  amLODipine (NORVASC) 5 MG tablet Take 5 mg by mouth daily. AM 08/29/14   Historical Provider, MD  aspirin EC 81 MG tablet Take 81 mg by mouth at bedtime.    Historical Provider, MD  atorvastatin (LIPITOR) 40 MG tablet Take 40 mg by mouth at bedtime.  08/16/14   Historical Provider, MD  Biotin 5000 MCG TABS Take 5,000 mcg by mouth daily. AM    Historical Provider, MD  Calcium Carbonate-Vitamin D 600-400 MG-UNIT per tablet Take 1 tablet by mouth daily. AM    Historical Provider, MD  clopidogrel (PLAVIX) 75 MG tablet Take 1 tablet (75 mg total) by mouth daily. 07/11/15   Bettey Costa, MD  Cyanocobalamin (RA VITAMIN B-12 TR) 1000 MCG TBCR Take 1 tablet by mouth daily. AM    Historical Provider, MD  Ferrous Sulfate (IRON) 325 (65 FE) MG TABS Take 1 tablet by mouth daily. AM    Historical Provider, MD  glipiZIDE  (GLUCOTROL) 10 MG tablet Take 1 tablet by mouth 2 (two) times daily. 07/04/15   Historical Provider, MD  Insulin Glargine (LANTUS SOLOSTAR) 100 UNIT/ML Solostar Pen Inject 36 Units into the skin daily. And 0-4 UNITS (DEPENDING ON BLOOD SUGAR) AT NIGHT 02/12/15   Historical Provider, MD  Insulin Pen Needle (ULTICARE MICRO PEN NEEDLES) 32G X 4 MM MISC  08/20/15   Historical Provider, MD  losartan (COZAAR) 100 MG tablet Take 100 mg by mouth daily. AM 02/05/14   Historical Provider, MD  ondansetron (ZOFRAN ODT) 8 MG disintegrating tablet Take 8 mg by mouth as needed for nausea or vomiting.    Historical Provider, MD  ondansetron (ZOFRAN) 4 MG tablet Take 1 tablet (4 mg total) by mouth daily as needed for nausea or vomiting. 03/02/16 03/02/17  Pierce Crane Cherrell Maybee, PA-C  sertraline (ZOLOFT) 50 MG tablet Take 1  tablet (50 mg total) by mouth daily. 02/24/16   Wilhelmina Mcardle, MD  umeclidinium-vilanterol [facility] ELLIPTA) 62.5-25 MCG/INH AEPB Inhale 1 puff into the lungs daily. 02/24/16   Wilhelmina Mcardle, MD    Allergies Beta adrenergic blockers; Celebrex [celecoxib]; Niacin; Niacin and related; Oxycodone; Shellfish allergy; Vicodin [hydrocodone-acetaminophen]; and Heparin  Family History  Problem Relation Age of Onset  . Heart disease Father   . Diabetes Other   . Breast cancer Neg Hx     Social History Social History  Substance Use Topics  . Smoking status: Never Smoker  . Smokeless tobacco: Never Used  . Alcohol use 0.0 oz/week     Comment: 2-3 drinks per year    Review of Systems Constitutional: No fever/chills Cardiovascular: Denies chest pain. Respiratory: Denies shortness of breath. Gastrointestinal: No abdominal pain.  No nausea, no vomiting.  No diarrhea.  Genitourinary: Negative for dysuria. Musculoskeletal: Positive for chronic back pain and right hip pain. Negative for swelling or redness in right hip.  Skin: Negative for rash. Neurological: Negative for headaches, focal weakness or  numbness.  ____________________________________________   PHYSICAL EXAM:  VITAL SIGNS: ED Triage Vitals  Enc Vitals Group     BP 03/02/16 1431 (!) 147/84     Pulse Rate 03/02/16 1429 (!) 119     Resp 03/02/16 1429 18     Temp 03/02/16 1429 98.3 F (36.8 C)     Temp Source 03/02/16 1429 Oral     SpO2 03/02/16 1429 100 %     Weight 03/02/16 1430 182 lb (82.6 kg)     Height 03/02/16 1430 5\' 2"  (1.575 m)     Head Circumference --      Peak Flow --      Pain Score 03/02/16 1430 4     Pain Loc --      Pain Edu? --      Excl. in [place]? --     Constitutional: Alert and oriented. Well appearing and in no acute distress. Eyes: Conjunctivae are normal.  Head: Atraumatic. Neck: No stridor. Supple, full ROM without pain or difficulty.  Cardiovascular: Bilateral DP and PT pulses 2+. Lower extremities warm, no edema or swelling.  Respiratory: Normal respiratory effort.  No retractions.  Musculoskeletal: Right hip without swelling and not tender to palpation. Mild limitation in ROM at right hip due to pain, no limitation in ROM of right knee. No bony tenderness of the spine.  Neurologic:  Normal speech and language. No gross focal neurologic deficits are appreciated. No gait instability. Skin:  Skin is warm, dry and intact. No rash noted. Psychiatric: Mood and affect are normal. Speech and behavior are normal.  ____________________________________________   LABS (all labs ordered are listed, but only abnormal results are displayed)  Labs Reviewed - No data to display ____________________________________________  EKG  None ____________________________________________  RADIOLOGY  XR of right hip shows mild joint space narrowing, but no acute bony pathology. ____________________________________________   PROCEDURES  Procedure(s) performed: None  Procedures  Critical Care performed: No  ____________________________________________   INITIAL IMPRESSION / ASSESSMENT AND  PLAN / ED COURSE  Pertinent labs & imaging results that were available during my care of the patient were reviewed by me and considered in my medical decision making (see chart for details).  Patient presentation consistent with osteoarthritis of the right hip. Patient given 50 mg Tramadol PO and Zofran 4 mg PO in the ED and tolerated well. Patient given prescription for tylenol-3 300-30 mg PO tabs #  30, refills 0. Prescription for Zofran 4 mg PO tabs #30, 0 refills. Patient to follow up with orthopedic surgery as needed. No other emergency medicine complaints at this time.   Clinical Course     ____________________________________________   FINAL CLINICAL IMPRESSION(S) / ED DIAGNOSES  Final diagnoses:  Right hip pain  Osteoarthritis of right hip, unspecified osteoarthritis type      NEW MEDICATIONS STARTED DURING THIS VISIT:  New Prescriptions   ACETAMINOPHEN-CODEINE (TYLENOL #3) 300-30 MG TABLET    Take 1-2 tablets by mouth every 6 (six) hours as needed for moderate pain.   ONDANSETRON (ZOFRAN) 4 MG TABLET    Take 1 tablet (4 mg total) by mouth daily as needed for nausea or vomiting.     Note:  This document was prepared using Dragon voice recognition software and may include unintentional dictation errors.   Arlyss Repress, PA-C 03/02/16 1747    Rudene Re, MD 03/03/16 [phone]

## 2016-03-02 NOTE — ED Triage Notes (Signed)
C/O right hip pain since Sunday morning.  Pain worse with standing and movement.  Pain improves when laying down on right side.  Denies injury.  Patient has had back surgery in the past.

## 2016-03-02 NOTE — ED Notes (Signed)

## 2016-03-02 NOTE — ED Notes (Signed)
Having right hip pain  States pain started on Sunday   Pain goes from hip area into buttocks  Denies any injury

## 2016-03-03 ENCOUNTER — Encounter: Payer: Self-pay | Admitting: Obstetrics and Gynecology

## 2016-03-03 ENCOUNTER — Emergency Department
Admission: EM | Admit: 2016-03-03 | Discharge: 2016-03-03 | Disposition: A | Discharge status: Home / Self Care | Payer: Medicare Other | Attending: Emergency Medicine | Admitting: Emergency Medicine

## 2016-03-03 DIAGNOSIS — I129 Hypertensive chronic kidney disease with stage 1 through stage 4 chronic kidney disease, or unspecified chronic kidney disease: Secondary | ICD-10-CM | POA: Diagnosis not present

## 2016-03-03 DIAGNOSIS — Z79899 Other long term (current) drug therapy: Secondary | ICD-10-CM | POA: Diagnosis not present

## 2016-03-03 DIAGNOSIS — N183 Chronic kidney disease, stage 3 (moderate): Secondary | ICD-10-CM | POA: Diagnosis not present

## 2016-03-03 DIAGNOSIS — E1122 Type 2 diabetes mellitus with diabetic chronic kidney disease: Secondary | ICD-10-CM | POA: Diagnosis not present

## 2016-03-03 DIAGNOSIS — Z7982 Long term (current) use of aspirin: Secondary | ICD-10-CM | POA: Diagnosis not present

## 2016-03-03 DIAGNOSIS — Z794 Long term (current) use of insulin: Secondary | ICD-10-CM | POA: Insufficient documentation

## 2016-03-03 DIAGNOSIS — R110 Nausea: Secondary | ICD-10-CM | POA: Insufficient documentation

## 2016-03-03 DIAGNOSIS — I2510 Atherosclerotic heart disease of native coronary artery without angina pectoris: Secondary | ICD-10-CM | POA: Diagnosis not present

## 2016-03-03 LAB — BASIC METABOLIC PANEL
ANION GAP: 7 (ref 5–15)
BUN: 30 mg/dL — ABNORMAL HIGH (ref 6–20)
CO2: 22 mmol/L (ref 22–32)
Calcium: 8.7 mg/dL — ABNORMAL LOW (ref 8.9–10.3)
Chloride: 110 mmol/L (ref 101–111)
Creatinine, Ser: 1.52 mg/dL — ABNORMAL HIGH (ref 0.44–1.00)
GFR calc Af Amer: 40 mL/min — ABNORMAL LOW (ref >60)
GFR calc non Af Amer: 35 mL/min — ABNORMAL LOW (ref >60)
GLUCOSE: 171 mg/dL — AB (ref 65–99)
Potassium: 4.4 mmol/L (ref 3.5–5.1)
Sodium: 139 mmol/L (ref 135–145)

## 2016-03-03 LAB — CBC
HEMATOCRIT: 34 % — AB (ref 35.0–47.0)
HEMOGLOBIN: 11.6 g/dL — AB (ref 12.0–16.0)
MCH: 31.2 pg (ref 26.0–34.0)
MCHC: 34.2 g/dL (ref 32.0–36.0)
MCV: 91.2 fL (ref 80.0–100.0)
Platelets: 196 10*3/uL (ref 150–440)
RBC: 3.72 MIL/uL — ABNORMAL LOW (ref 3.80–5.20)
RDW: 12.7 % (ref 11.5–14.5)
WBC: 8.7 10*3/uL (ref 3.6–11.0)

## 2016-03-03 MED ORDER — KETOROLAC TROMETHAMINE 30 MG/ML IJ SOLN
30.0000 mg | Freq: Once | INTRAMUSCULAR | Status: AC
  Administered 2016-03-03: 30 mg via INTRAVENOUS
  Filled 2016-03-03: qty 1

## 2016-03-03 MED ORDER — SODIUM CHLORIDE 0.9 % IV BOLUS (SEPSIS)
1000.0000 mL | Freq: Once | INTRAVENOUS | Status: AC
  Administered 2016-03-03: 1000 mL via INTRAVENOUS

## 2016-03-03 MED ORDER — ONDANSETRON HCL 4 MG/2ML IJ SOLN
INTRAMUSCULAR | Status: AC
  Filled 2016-03-03: qty 2

## 2016-03-03 MED ORDER — PROMETHAZINE HCL 25 MG/ML IJ SOLN
12.5000 mg | Freq: Four times a day (QID) | INTRAMUSCULAR | Status: DC | PRN
  Administered 2016-03-03: 12.5 mg via INTRAVENOUS

## 2016-03-03 MED ORDER — PROMETHAZINE HCL 12.5 MG RE SUPP: 12.5000 mg | Freq: Four times a day (QID) | RECTAL | 0 refills | Status: DC | PRN

## 2016-03-03 MED ORDER — PROMETHAZINE HCL 25 MG/ML IJ SOLN
INTRAMUSCULAR | Status: AC
Start: 2016-03-03 — End: 2016-03-03
  Administered 2016-03-03: 12.5 mg via INTRAVENOUS
  Filled 2016-03-03: qty 1

## 2016-03-03 NOTE — ED Triage Notes (Signed)
Pt reports being seen in ED yesterday for hip pain and was given tramadol and zofran in the ER for pain.  Pt reports as of a few hours after medication given, becoming very nauseated but never throwing up.  Pt reportedly tried to take her ODT zofran, but could not get pill to dissolve.  EMS reports that pt was orthostatic postivie and gave 800cc of NS bolus as well as 4mg  zofran IV.  Pt reports that zofran has not helped much at this time.

## 2016-03-03 NOTE — ED Notes (Signed)
Pt reports that she still has nausea and that the back of her neck hurts.  EDP notified.  Lights turned down for patient comfort.

## 2016-03-03 NOTE — ED Provider Notes (Signed)
[facility] Emergency Department Provider Note   First MD Initiated Contact with Patient 03/03/16 [phone]     (approximate)  I have reviewed the triage vital signs and the nursing notes.   HISTORY  Chief Complaint Nausea   HPI Dorothy Rogers is a 65 y.o. female presents emergency Department with nausea without vomiting after taking tramadol which she was prescribed yesterday after visiting emergency department for hip pain. Patient denies any diarrhea no fever afebrile on presentation were temperature 98.5. Patient stated that she took Zofran ODT at home however it would not dissolve. Patient denies any pain on presentation to the emergency department.   Past Medical History:  Diagnosis Date  . Anemia   . Anemia   . Arthritis    lower back, hips  . Blood transfusion without reported diagnosis   . CAD (coronary artery disease) FEB AND NOV 2009   6 STENTS  . 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 Cataract   . Chronic kidney disease    Stage III  . Coronary artery disease   . DDD (degenerative disc disease), lumbar   . Diabetes mellitus without complication ([place])    TYPE 2  . Herpes simplex   . Hyperlipidemia   . Hypertension    CONTROLLED ON MEDS  . Vertigo    occasional, no episodes in 2-3 months  . Vertigo    OCCASIONALLY  . Vitamin B 12 deficiency     Patient Active Problem List   Diagnosis Date Noted  . Non-intractable cyclical vomiting with nausea   . Congenital esophageal defect   . Hiatal hernia   . Gastritis   . Diarrhea 07/10/2015  . Diabetes mellitus ([place]) 07/02/2015  . Adjustment disorder 06/16/2015  . Abdominal pain 06/16/2015  . C. difficile colitis 06/16/2015  . Intractable nausea and vomiting 06/09/2015  . Dehydration 06/09/2015  . Type 2 diabetes mellitus ([place]) 06/09/2015  . HLD (hyperlipidemia) 06/09/2015  . HTN (hypertension) 06/09/2015  . CAD (coronary artery disease) 06/09/2015  . CKD (chronic  kidney disease), stage III 06/09/2015  . Thrombocytopenia ([place]) 05/30/2015  . Lumbar canal stenosis 05/30/2015  . Disc disease with myelopathy, lumbar 05/30/2015  . Presence of stent in coronary artery 05/30/2015  . 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 Status post lumbar spinal fusion 12/25/2014  . DDD (degenerative disc disease), lumbar 11/18/2014  . Lumbar post-laminectomy syndrome 11/18/2014  . Facet syndrome, lumbar 11/18/2014  . Piriformis syndrome of left side 11/18/2014  . Status post lumbar spine operation 05/09/2014  . Adiposity 04/23/2014  . Thoracic and lumbosacral neuritis 03/27/2013  . H/O arthrodesis 03/27/2013  . Peripheral vascular disease ([place]) 03/22/2013  . Hypomagnesemia 03/22/2013  . Absolute anemia 03/22/2013    Past Surgical History:  Procedure Laterality Date  . [place] STUDY N/A 02/04/2016   Procedure: [place] STUDY;  Surgeon: Lucilla Lame, MD;  Location: [facility] ENDOSCOPY;  Service: Endoscopy;  Laterality: N/A;  . ABDOMINAL HYSTERECTOMY    . BACK SURGERY     Lumbar spinal fusion 05/30/2015  . CARDIAC CATHETERIZATION  2009  . CATARACT EXTRACTION Left   . CATARACT EXTRACTION W/PHACO Right 03/19/2015   Procedure: CATARACT EXTRACTION PHACO AND INTRAOCULAR LENS PLACEMENT ([place]);  Surgeon: Leandrew Koyanagi, MD;  Location: [place];  Service: Ophthalmology;  Laterality: Right;  DIABETIC - insulin and oral meds  . CORONARY STENT PLACEMENT  03/2008  . CYSTECTOMY Right    wrist  . ESOPHAGEAL  MANOMETRY N/A 02/04/2016   Procedure: ESOPHAGEAL MANOMETRY (EM);  Surgeon: Lucilla Lame, MD;  Location: [facility] ENDOSCOPY;  Service: Endoscopy;  Laterality: N/A;  . ESOPHAGOGASTRODUODENOSCOPY (EGD) WITH PROPOFOL N/A 09/04/2015   Procedure: ESOPHAGOGASTRODUODENOSCOPY (EGD) WITH PROPOFOL;  Surgeon: Lucilla Lame, MD;  Location: [place];  Service: Endoscopy;  Laterality: N/A;  INSULIN DEPENDENT DIABETIC  . EYE SURGERY Left    cataract extraction  . LUMBAR  FUSION  2001, 2014, 2015  . [place]     2001, 2014, 2015  . TUMOR EXCISION     back of neck    Prior to Admission medications   Medication Sig Start Date End Date Taking? Authorizing Provider  acetaminophen (TYLENOL) 325 MG tablet Take 650 mg by mouth every 6 (six) hours as needed for moderate pain, fever or headache.     Historical Provider, MD  acetaminophen-codeine (TYLENOL #3) 300-30 MG tablet Take 1-2 tablets by mouth every 6 (six) hours as needed for moderate pain. 03/02/16   Pierce Crane Beers, PA-C  acyclovir (ZOVIRAX) 400 MG tablet as needed.  08/07/15   Historical Provider, MD  amLODipine (NORVASC) 5 MG tablet Take 5 mg by mouth daily. AM 08/29/14   Historical Provider, MD  aspirin EC 81 MG tablet Take 81 mg by mouth at bedtime.    Historical Provider, MD  atorvastatin (LIPITOR) 40 MG tablet Take 40 mg by mouth at bedtime.  08/16/14   Historical Provider, MD  Biotin 5000 MCG TABS Take 5,000 mcg by mouth daily. AM    Historical Provider, MD  Calcium Carbonate-Vitamin D 600-400 MG-UNIT per tablet Take 1 tablet by mouth daily. AM    Historical Provider, MD  clopidogrel (PLAVIX) 75 MG tablet Take 1 tablet (75 mg total) by mouth daily. 07/11/15   Bettey Costa, MD  Cyanocobalamin (RA VITAMIN B-12 TR) 1000 MCG TBCR Take 1 tablet by mouth daily. AM    Historical Provider, MD  Ferrous Sulfate (IRON) 325 (65 FE) MG TABS Take 1 tablet by mouth daily. AM    Historical Provider, MD  glipiZIDE (GLUCOTROL) 10 MG tablet Take 1 tablet by mouth 2 (two) times daily. 07/04/15   Historical Provider, MD  Insulin Glargine (LANTUS SOLOSTAR) 100 UNIT/ML Solostar Pen Inject 36 Units into the skin daily. And 0-4 UNITS (DEPENDING ON BLOOD SUGAR) AT NIGHT 02/12/15   Historical Provider, MD  Insulin Pen Needle (ULTICARE MICRO PEN NEEDLES) 32G X 4 MM MISC  08/20/15   Historical Provider, MD  losartan (COZAAR) 100 MG tablet Take 100 mg by mouth daily. AM 02/05/14   Historical Provider, MD  ondansetron (ZOFRAN ODT) 8 MG  disintegrating tablet Take 8 mg by mouth as needed for nausea or vomiting.    Historical Provider, MD  ondansetron (ZOFRAN) 4 MG tablet Take 1 tablet (4 mg total) by mouth daily as needed for nausea or vomiting. 03/02/16 03/02/17  Pierce Crane Beers, PA-C  sertraline (ZOLOFT) 50 MG tablet Take 1 tablet (50 mg total) by mouth daily. 02/24/16   Wilhelmina Mcardle, MD  umeclidinium-vilanterol [facility] ELLIPTA) 62.5-25 MCG/INH AEPB Inhale 1 puff into the lungs daily. 02/24/16   Wilhelmina Mcardle, MD    Allergies Beta adrenergic blockers; Celebrex [celecoxib]; Niacin; Niacin and related; Oxycodone; Shellfish allergy; Vicodin [hydrocodone-acetaminophen]; and Heparin  Family History  Problem Relation Age of Onset  . Heart disease Father   . Diabetes Other   . Breast cancer Neg Hx     Social History Social History  Substance Use Topics  .  Smoking status: Never Smoker  . Smokeless tobacco: Never Used  . Alcohol use 0.0 oz/week     Comment: 2-3 drinks per year    Review of Systems Constitutional: No fever/chills Eyes: No visual changes. ENT: No sore throat. Cardiovascular: Denies chest pain. Respiratory: Denies shortness of breath. Gastrointestinal: No abdominal pain.  Positive for nausea. no vomiting.  No diarrhea.  No constipation. Genitourinary: Negative for dysuria. Musculoskeletal: Negative for back pain. Skin: Negative for rash. Neurological: Negative for headaches, focal weakness or numbness.  10-point ROS otherwise negative.  ____________________________________________   PHYSICAL EXAM:  VITAL SIGNS: ED Triage Vitals  Enc Vitals Group     BP 03/03/16 0531 (!) 143/75     Pulse Rate 03/03/16 0531 (!) 102     Resp 03/03/16 0531 18     Temp 03/03/16 0531 98.5 F (36.9 C)     Temp Source 03/03/16 0531 Oral     SpO2 03/03/16 0531 100 %     Weight 03/03/16 0532 182 lb (82.6 kg)     Height 03/03/16 0532 5\' 2"  (1.575 m)     Head Circumference --      Peak Flow --      Pain Score  03/03/16 0532 5     Pain Loc --      Pain Edu? --      Excl. in [place]? --     Constitutional: Alert and oriented. Well appearing and in no acute distress. Eyes: Conjunctivae are normal. PERRL. EOMI. Head: Atraumatic. Mouth/Throat: Mucous membranes are moist.  Oropharynx non-erythematous. Neck: No stridor.  No meningeal signs.   Cardiovascular: Normal rate, regular rhythm. Good peripheral circulation. Grossly normal heart sounds. Respiratory: Normal respiratory effort.  No retractions. Lungs CTAB. Gastrointestinal: Soft and nontender. No distention.  Musculoskeletal: No lower extremity tenderness nor edema. No gross deformities of extremities. Neurologic:  Normal speech and language. No gross focal neurologic deficits are appreciated.  Skin:  Skin is warm, dry and intact. No rash noted. Psychiatric: Mood and affect are normal. Speech and behavior are normal.**}  ____________________________________________   LABS (all labs ordered are listed, but only abnormal results are displayed)  Labs Reviewed  BASIC METABOLIC PANEL  CBC   ____________________________________________  EKG  ED ECG REPORT I, [place], the attending physician, personally viewed and interpreted this ECG.   Date: 03/03/2016  EKG Time: 5:30 AM  Rate: 104  Rhythm: Sinus tachycardia  Axis: Normal  Intervals: Normal  ST&T Change: None  ____________________________________________  RADIOLOGY I, [place] N Laker Thompson, personally viewed and evaluated these images (plain radiographs) as part of my medical decision making, as well as reviewing the written report by the radiologist.  Dg Hip Unilat With Pelvis 2-3 Views Right  Result Date: 03/02/2016 CLINICAL DATA:  Right hip pain EXAM: DG HIP (WITH OR WITHOUT PELVIS) 2-3V RIGHT COMPARISON:  06/15/2015 FINDINGS: No acute fracture. No dislocation. Osteopenia. Stabilization hardware in the lower lumbar spine for lumbar fusion. Mild joint space narrowing of the hip  joints right greater than left. IMPRESSION: No acute bony pathology. Electronically Signed   By: Marybelle Killings M.D.   On: 03/02/2016 17:00     Procedures      INITIAL IMPRESSION / ASSESSMENT AND PLAN / ED COURSE  Pertinent labs & imaging results that were available during my care of the patient were reviewed by me and considered in my medical decision making (see chart for details).     Clinical Course    ____________________________________________  FINAL  CLINICAL IMPRESSION(S) / ED DIAGNOSES  Final diagnoses:  Nausea without vomiting     MEDICATIONS GIVEN DURING THIS VISIT:  Medications  promethazine (PHENERGAN) injection 12.5 mg (12.5 mg Intravenous Given 03/03/16 0537)  ketorolac (TORADOL) 30 MG/ML injection 30 mg (not administered)  sodium chloride 0.9 % bolus 1,000 mL (1,000 mLs Intravenous New Bag/Given 03/03/16 0536)     NEW OUTPATIENT MEDICATIONS STARTED DURING THIS VISIT:  New Prescriptions   No medications on file    Modified Medications   No medications on file    Discontinued Medications   No medications on file     Note:  This document was prepared using Dragon voice recognition software and may include unintentional dictation errors.    Gregor Hams, MD 03/03/16 [phone]

## 2016-03-03 NOTE — ED Notes (Signed)
Pt resting in bed, husband at bedside, pt denies any needs at this time

## 2016-03-11 ENCOUNTER — Encounter: Payer: Self-pay | Admitting: *Deleted

## 2016-03-11 ENCOUNTER — Observation Stay
Admission: RE | Admit: 2016-03-11 | Discharge: 2016-03-14 | Disposition: A | Discharge status: Home / Self Care | Payer: Medicare Other | Source: Ambulatory Visit | Attending: General Surgery | Admitting: General Surgery

## 2016-03-11 ENCOUNTER — Inpatient Hospital Stay: Payer: Medicare Other | Admitting: Anesthesiology

## 2016-03-11 ENCOUNTER — Observation Stay: Payer: Medicare Other

## 2016-03-11 ENCOUNTER — Encounter
Admission: RE | Disposition: A | Discharge status: Home / Self Care | Payer: Self-pay | Source: Ambulatory Visit | Attending: General Surgery

## 2016-03-11 DIAGNOSIS — Z885 Allergy status to narcotic agent status: Secondary | ICD-10-CM | POA: Diagnosis not present

## 2016-03-11 DIAGNOSIS — Z7902 Long term (current) use of antithrombotics/antiplatelets: Secondary | ICD-10-CM | POA: Insufficient documentation

## 2016-03-11 DIAGNOSIS — I2510 Atherosclerotic heart disease of native coronary artery without angina pectoris: Secondary | ICD-10-CM | POA: Insufficient documentation

## 2016-03-11 DIAGNOSIS — Z7982 Long term (current) use of aspirin: Secondary | ICD-10-CM | POA: Insufficient documentation

## 2016-03-11 DIAGNOSIS — Z888 Allergy status to other drugs, medicaments and biological substances status: Secondary | ICD-10-CM | POA: Insufficient documentation

## 2016-03-11 DIAGNOSIS — Z91013 Allergy to seafood: Secondary | ICD-10-CM | POA: Diagnosis not present

## 2016-03-11 DIAGNOSIS — I10 Essential (primary) hypertension: Secondary | ICD-10-CM | POA: Insufficient documentation

## 2016-03-11 DIAGNOSIS — K449 Diaphragmatic hernia without obstruction or gangrene: Secondary | ICD-10-CM | POA: Diagnosis present

## 2016-03-11 DIAGNOSIS — Z6836 Body mass index (BMI) 36.0-36.9, adult: Secondary | ICD-10-CM | POA: Insufficient documentation

## 2016-03-11 DIAGNOSIS — R1310 Dysphagia, unspecified: Secondary | ICD-10-CM

## 2016-03-11 DIAGNOSIS — N183 Chronic kidney disease, stage 3 unspecified: Secondary | ICD-10-CM

## 2016-03-11 DIAGNOSIS — Z794 Long term (current) use of insulin: Secondary | ICD-10-CM | POA: Diagnosis not present

## 2016-03-11 DIAGNOSIS — D649 Anemia, unspecified: Secondary | ICD-10-CM | POA: Insufficient documentation

## 2016-03-11 DIAGNOSIS — F419 Anxiety disorder, unspecified: Secondary | ICD-10-CM | POA: Insufficient documentation

## 2016-03-11 DIAGNOSIS — E1151 Type 2 diabetes mellitus with diabetic peripheral angiopathy without gangrene: Secondary | ICD-10-CM | POA: Insufficient documentation

## 2016-03-11 LAB — GLUCOSE, CAPILLARY
GLUCOSE-CAPILLARY: 265 mg/dL — AB (ref 65–99)
GLUCOSE-CAPILLARY: 232 mg/dL — AB (ref 65–99)
GLUCOSE-CAPILLARY: 195 mg/dL — AB (ref 65–99)
Glucose-Capillary: 124 mg/dL — ABNORMAL HIGH (ref 65–99)
Glucose-Capillary: 171 mg/dL — ABNORMAL HIGH (ref 65–99)

## 2016-03-11 SURGERY — REPAIR, HERNIA, PARAESOPHAGEAL, LAPAROSCOPIC
Anesthesia: General | Wound class: Clean

## 2016-03-11 MED ORDER — DIAZEPAM 5 MG/ML IJ SOLN: 5.0000 mg | Freq: Four times a day (QID) | INTRAMUSCULAR | Status: DC | PRN

## 2016-03-11 MED ORDER — LIDOCAINE HCL (CARDIAC) 20 MG/ML IV SOLN
INTRAVENOUS | Status: DC | PRN
  Administered 2016-03-11: 80 mg via INTRAVENOUS

## 2016-03-11 MED ORDER — FENTANYL CITRATE (PF) 100 MCG/2ML IJ SOLN
25.0000 ug | INTRAMUSCULAR | Status: AC | PRN
  Administered 2016-03-11 (×6): 25 ug via INTRAVENOUS

## 2016-03-11 MED ORDER — INSULIN ASPART 100 UNIT/ML ~~LOC~~ SOLN
0.0000 [IU] | Freq: Three times a day (TID) | SUBCUTANEOUS | Status: DC
  Administered 2016-03-12: 2 [IU] via SUBCUTANEOUS
  Administered 2016-03-13: 3 [IU] via SUBCUTANEOUS
  Administered 2016-03-13: 5 [IU] via SUBCUTANEOUS
  Administered 2016-03-14: 3 [IU] via SUBCUTANEOUS
  Filled 2016-03-11: qty 3
  Filled 2016-03-11: qty 2
  Filled 2016-03-11: qty 5
  Filled 2016-03-11 (×2): qty 3

## 2016-03-11 MED ORDER — ONDANSETRON HCL 4 MG/2ML IJ SOLN: 4.0000 mg | Freq: Once | INTRAMUSCULAR | Status: DC | PRN

## 2016-03-11 MED ORDER — FENTANYL CITRATE (PF) 100 MCG/2ML IJ SOLN
INTRAMUSCULAR | Status: DC | PRN
  Administered 2016-03-11 (×7): 50 ug via INTRAVENOUS

## 2016-03-11 MED ORDER — CIPROFLOXACIN IN D5W 400 MG/200ML IV SOLN
INTRAVENOUS | Status: AC
  Filled 2016-03-11: qty 200

## 2016-03-11 MED ORDER — LACTATED RINGERS IV SOLN
Freq: Once | INTRAVENOUS | Status: AC
  Administered 2016-03-11: 15:00:00 via INTRAVENOUS

## 2016-03-11 MED ORDER — ONDANSETRON HCL 4 MG/2ML IJ SOLN
INTRAMUSCULAR | Status: DC | PRN
  Administered 2016-03-11: 4 mg via INTRAVENOUS

## 2016-03-11 MED ORDER — FENTANYL CITRATE (PF) 100 MCG/2ML IJ SOLN
INTRAMUSCULAR | Status: AC
  Administered 2016-03-11: 25 ug via INTRAVENOUS
  Filled 2016-03-11: qty 2

## 2016-03-11 MED ORDER — FAMOTIDINE IN NACL 20-0.9 MG/50ML-% IV SOLN
20.0000 mg | Freq: Two times a day (BID) | INTRAVENOUS | Status: DC
Start: 2016-03-11 — End: 2016-03-14
  Administered 2016-03-11 – 2016-03-13 (×6): 20 mg via INTRAVENOUS
  Filled 2016-03-11 (×8): qty 50

## 2016-03-11 MED ORDER — DIPHENHYDRAMINE HCL 50 MG/ML IJ SOLN
12.5000 mg | Freq: Four times a day (QID) | INTRAMUSCULAR | Status: DC | PRN
Start: 2016-03-11 — End: 2016-03-14

## 2016-03-11 MED ORDER — LORAZEPAM 2 MG/ML IJ SOLN: 1.0000 mg | Freq: Four times a day (QID) | INTRAMUSCULAR | Status: DC | PRN

## 2016-03-11 MED ORDER — BUPIVACAINE-EPINEPHRINE (PF) 0.5% -1:200000 IJ SOLN
INTRAMUSCULAR | Status: AC
  Filled 2016-03-11: qty 30

## 2016-03-11 MED ORDER — FAMOTIDINE 20 MG PO TABS: 20.0000 mg | ORAL_TABLET | Freq: Once | ORAL | Status: DC

## 2016-03-11 MED ORDER — HYDROMORPHONE HCL 1 MG/ML IJ SOLN
INTRAMUSCULAR | Status: AC
  Filled 2016-03-11: qty 1

## 2016-03-11 MED ORDER — SERTRALINE HCL 50 MG PO TABS
50.0000 mg | ORAL_TABLET | Freq: Every day | ORAL | Status: DC
  Filled 2016-03-11 (×3): qty 1

## 2016-03-11 MED ORDER — LOSARTAN POTASSIUM 50 MG PO TABS
100.0000 mg | ORAL_TABLET | Freq: Every day | ORAL | Status: DC
  Administered 2016-03-13: 100 mg via ORAL
  Filled 2016-03-11 (×2): qty 2

## 2016-03-11 MED ORDER — SODIUM CHLORIDE 0.9 % IV SOLN
3.0000 g | Freq: Once | INTRAVENOUS | Status: AC
  Administered 2016-03-11: 3 g via INTRAVENOUS
  Filled 2016-03-11: qty 3

## 2016-03-11 MED ORDER — UMECLIDINIUM-VILANTEROL 62.5-25 MCG/INH IN AEPB
1.0000 | INHALATION_SPRAY | Freq: Every day | RESPIRATORY_TRACT | Status: DC
  Filled 2016-03-11: qty 14

## 2016-03-11 MED ORDER — METRONIDAZOLE IN NACL 5-0.79 MG/ML-% IV SOLN
500.0000 mg | INTRAVENOUS | Status: DC
  Filled 2016-03-11: qty 100

## 2016-03-11 MED ORDER — CHLORHEXIDINE GLUCONATE CLOTH 2 % EX PADS: 6.0000 | MEDICATED_PAD | Freq: Once | CUTANEOUS | Status: DC

## 2016-03-11 MED ORDER — KETOROLAC TROMETHAMINE 30 MG/ML IJ SOLN
INTRAMUSCULAR | Status: AC
  Administered 2016-03-11: 30 mg via INTRAVENOUS
  Filled 2016-03-11: qty 1

## 2016-03-11 MED ORDER — INSULIN ASPART 100 UNIT/ML ~~LOC~~ SOLN
SUBCUTANEOUS | Status: AC
  Filled 2016-03-11: qty 5

## 2016-03-11 MED ORDER — PROPOFOL 10 MG/ML IV BOLUS
INTRAVENOUS | Status: DC | PRN
  Administered 2016-03-11: 170 mg via INTRAVENOUS

## 2016-03-11 MED ORDER — HYDRALAZINE HCL 20 MG/ML IJ SOLN: 10.0000 mg | INTRAMUSCULAR | Status: DC | PRN

## 2016-03-11 MED ORDER — MORPHINE SULFATE (PF) 4 MG/ML IV SOLN
4.0000 mg | INTRAVENOUS | Status: DC | PRN
Start: 2016-03-11 — End: 2016-03-13
  Administered 2016-03-11 – 2016-03-12 (×3): 4 mg via INTRAVENOUS
  Filled 2016-03-11 (×3): qty 1

## 2016-03-11 MED ORDER — FAMOTIDINE 20 MG PO TABS
ORAL_TABLET | ORAL | Status: AC
  Filled 2016-03-11: qty 1

## 2016-03-11 MED ORDER — BUPIVACAINE-EPINEPHRINE (PF) 0.5% -1:200000 IJ SOLN
INTRAMUSCULAR | Status: DC | PRN
  Administered 2016-03-11: 30 mL via PERINEURAL

## 2016-03-11 MED ORDER — DEXAMETHASONE SODIUM PHOSPHATE 10 MG/ML IJ SOLN
INTRAMUSCULAR | Status: DC | PRN
  Administered 2016-03-11: 5 mg via INTRAVENOUS

## 2016-03-11 MED ORDER — SODIUM CHLORIDE 0.9 % IV SOLN
INTRAVENOUS | Status: DC
  Administered 2016-03-11 (×2): via INTRAVENOUS

## 2016-03-11 MED ORDER — OXYCODONE HCL 5 MG/5ML PO SOLN
5.0000 mg | ORAL | Status: DC | PRN
  Filled 2016-03-11: qty 5

## 2016-03-11 MED ORDER — PROMETHAZINE HCL 25 MG/ML IJ SOLN
12.5000 mg | Freq: Four times a day (QID) | INTRAMUSCULAR | Status: DC | PRN
  Administered 2016-03-11 – 2016-03-13 (×3): 12.5 mg via INTRAVENOUS
  Filled 2016-03-11 (×4): qty 1

## 2016-03-11 MED ORDER — ONDANSETRON 4 MG PO TBDP
4.0000 mg | ORAL_TABLET | Freq: Four times a day (QID) | ORAL | Status: DC | PRN
  Administered 2016-03-13 – 2016-03-14 (×2): 4 mg via ORAL
  Filled 2016-03-11 (×2): qty 1

## 2016-03-11 MED ORDER — KETOROLAC TROMETHAMINE 30 MG/ML IJ SOLN
30.0000 mg | Freq: Once | INTRAMUSCULAR | Status: AC
  Administered 2016-03-11: 30 mg via INTRAVENOUS

## 2016-03-11 MED ORDER — CIPROFLOXACIN IN D5W 400 MG/200ML IV SOLN: 400.0000 mg | INTRAVENOUS | Status: DC

## 2016-03-11 MED ORDER — AMLODIPINE BESYLATE 5 MG PO TABS: 5.0000 mg | ORAL_TABLET | Freq: Every day | ORAL | Status: DC

## 2016-03-11 MED ORDER — ROCURONIUM BROMIDE 100 MG/10ML IV SOLN
INTRAVENOUS | Status: DC | PRN
  Administered 2016-03-11: 20 mg via INTRAVENOUS
  Administered 2016-03-11: 10 mg via INTRAVENOUS
  Administered 2016-03-11: 20 mg via INTRAVENOUS
  Administered 2016-03-11: 30 mg via INTRAVENOUS
  Administered 2016-03-11: 20 mg via INTRAVENOUS

## 2016-03-11 MED ORDER — ONDANSETRON HCL 4 MG/2ML IJ SOLN
4.0000 mg | Freq: Four times a day (QID) | INTRAMUSCULAR | Status: DC | PRN
  Administered 2016-03-11 – 2016-03-13 (×6): 4 mg via INTRAVENOUS
  Filled 2016-03-11 (×6): qty 2

## 2016-03-11 MED ORDER — SUGAMMADEX SODIUM 200 MG/2ML IV SOLN
INTRAVENOUS | Status: DC | PRN
  Administered 2016-03-11: 160 mg via INTRAVENOUS

## 2016-03-11 MED ORDER — ONDANSETRON HCL 4 MG/2ML IJ SOLN
4.0000 mg | Freq: Once | INTRAMUSCULAR | Status: AC
  Administered 2016-03-11: 4 mg via INTRAVENOUS

## 2016-03-11 MED ORDER — DIPHENHYDRAMINE HCL 12.5 MG/5ML PO ELIX: 12.5000 mg | ORAL_SOLUTION | Freq: Four times a day (QID) | ORAL | Status: DC | PRN

## 2016-03-11 MED ORDER — ONDANSETRON HCL 4 MG/2ML IJ SOLN
INTRAMUSCULAR | Status: AC
  Filled 2016-03-11: qty 2

## 2016-03-11 MED ORDER — MIDAZOLAM HCL 2 MG/2ML IJ SOLN
INTRAMUSCULAR | Status: DC | PRN
  Administered 2016-03-11: 2 mg via INTRAVENOUS

## 2016-03-11 MED ORDER — ACETAMINOPHEN 160 MG/5ML PO SOLN
325.0000 mg | ORAL | Status: DC | PRN
  Administered 2016-03-11: 325 mg via ORAL
  Filled 2016-03-11 (×2): qty 10.2

## 2016-03-11 MED ORDER — INSULIN ASPART 100 UNIT/ML ~~LOC~~ SOLN
5.0000 [IU] | Freq: Once | SUBCUTANEOUS | Status: AC
Start: 2016-03-11 — End: 2016-03-11
  Administered 2016-03-11: 5 [IU] via SUBCUTANEOUS

## 2016-03-11 SURGICAL SUPPLY — 48 items
APPLIER CLIP 5 13 M/L LIGAMAX5 (MISCELLANEOUS) ×3
BLADE SURG 11 STRL SS SAFETY (MISCELLANEOUS) ×3 IMPLANT
CANISTER SUCT 1200ML W/VALVE (MISCELLANEOUS) ×3 IMPLANT
CATH TRAY 16F METER LATEX (MISCELLANEOUS) ×3 IMPLANT
CHLORAPREP W/TINT 26ML (MISCELLANEOUS) ×3 IMPLANT
CLIP APPLIE 5 13 M/L LIGAMAX5 (MISCELLANEOUS) ×1 IMPLANT
CLOSURE WOUND 1/2 X4 (GAUZE/BANDAGES/DRESSINGS)
DEFOGGER SCOPE WARMER CLEARIFY (MISCELLANEOUS) ×3 IMPLANT
DERMABOND ADVANCED (GAUZE/BANDAGES/DRESSINGS) ×2
DERMABOND ADVANCED .7 DNX12 (GAUZE/BANDAGES/DRESSINGS) ×1 IMPLANT
DEVICE SUTURE ENDOST 10MM (ENDOMECHANICALS) ×3 IMPLANT
DRAIN PENROSE 1/4X12 LTX (DRAIN) ×3 IMPLANT
DRAPE LEGGINS SURG 28X43 STRL (DRAPES) ×3 IMPLANT
DRSG TEGADERM 2X2.25 PEDS (GAUZE/BANDAGES/DRESSINGS) ×15 IMPLANT
DRSG TELFA 3X8 NADH (GAUZE/BANDAGES/DRESSINGS) ×3 IMPLANT
ENDOSTITCH 0 SINGLE 48 (SUTURE) IMPLANT
GLOVE BIO SURGEON STRL SZ7.5 (GLOVE) ×15 IMPLANT
GLOVE INDICATOR 8.0 STRL GRN (GLOVE) ×3 IMPLANT
GOWN STRL REUS W/ TWL LRG LVL3 (GOWN DISPOSABLE) ×4 IMPLANT
GOWN STRL REUS W/TWL LRG LVL3 (GOWN DISPOSABLE) ×8
IRRIGATION STRYKERFLOW (MISCELLANEOUS) ×1 IMPLANT
IRRIGATOR STRYKERFLOW (MISCELLANEOUS) ×3
IV NS 1000ML (IV SOLUTION) ×2
IV NS 1000ML BAXH (IV SOLUTION) ×1 IMPLANT
KIT PINK PAD W/HEAD ARE REST (MISCELLANEOUS) ×3
KIT PINK PAD W/HEAD ARM REST (MISCELLANEOUS) ×1 IMPLANT
KIT RM TURNOVER STRD PROC AR (KITS) ×3 IMPLANT
LABEL OR SOLS (LABEL) ×3 IMPLANT
MESH BIO-A 7X10 SYN MAT (Mesh General) ×3 IMPLANT
NEEDLE HYPO 25X1 1.5 SAFETY (NEEDLE) ×3 IMPLANT
NEEDLE VERESS 14GA 120MM (NEEDLE) ×3 IMPLANT
NS IRRIG 500ML POUR BTL (IV SOLUTION) ×3 IMPLANT
PACK LAP CHOLECYSTECTOMY (MISCELLANEOUS) ×3 IMPLANT
PLEDGET CV PTFE 7X3 (MISCELLANEOUS) IMPLANT
SCISSORS METZENBAUM CVD 33 (INSTRUMENTS) IMPLANT
SHEARS HARMONIC ACE PLUS 36CM (ENDOMECHANICALS) ×3 IMPLANT
SLEEVE ENDOPATH XCEL 5M (ENDOMECHANICALS) ×9 IMPLANT
STRIP CLOSURE SKIN 1/2X4 (GAUZE/BANDAGES/DRESSINGS) IMPLANT
SUT ENDOSTITCH SURGIDAC 2-0 GR (SUTURE)
SUT ETHIBOND CT1 BRD 2-0 30IN (SUTURE) ×6 IMPLANT
SUT SURGIDAC NAB ES-9 0 48 120 (SUTURE) ×15 IMPLANT
SUT VIC AB 0 SH 27 (SUTURE) ×6 IMPLANT
SUTURE ENDSITCH SURGIDC 2-0 GR (SUTURE) IMPLANT
SWABSTK COMLB BENZOIN TINCTURE (MISCELLANEOUS) IMPLANT
TROCAR XCEL NON-BLD 11X100MML (ENDOMECHANICALS) ×3 IMPLANT
TROCAR XCEL NON-BLD 5MMX100MML (ENDOMECHANICALS) ×3 IMPLANT
TROCAR XCEL UNIV SLVE 11M 100M (ENDOMECHANICALS) ×3 IMPLANT
TUBING INSUFFLATOR HEATED (MISCELLANEOUS) ×3 IMPLANT

## 2016-03-11 NOTE — H&P (View-Only) (Signed)
Patient ID: Dorothy Rogers, female   DOB: 1951/03/26, 65 y.o.   MRN: [id number]  CC: Shortness of breath  HPI Dorothy Rogers is a 65 y.o. female who presents to clinic today for evaluation of a large hiatal hernia. Patient reports for the last 10 months she's had a sudden onset of shortness of breath. She's had an extensive workup including cardiac and pulmonary without an obvious finding other than a new, increasingly large hiatal hernia with approximately 50% of her stomach being within her chest. Patient reports that about a year ago she had a sudden onset of a gastrointestinal illness with projectile nausea and vomiting that lasted for many weeks. The shortness of breath started to develop after that. She also had spine surgery last when her that after which she developed C. difficile colitis. During the straining associated with the colitis and its treatment her shortness of breath and chest pain worsened with image findings of a worsening hiatal hernia. She has had symptoms of reflux and gas pains within her chest that it been relieved with antiacid medication and gas ex. She also received platelets having chronic burping and gas issues. She denies any fevers, chills, diarrhea, constipation currently. She has a chronic underlying chest pain with intermittent nausea and shortness of breath. She had a recent cardiac workup in July without new findings per her. She had an upper GI performed last month which confirmed the large hiatal hernia with stomach protruding into her chest. Manometry was attempted to be accomplished however the catheter was not able to be passed into her esophagus successfully.  HPI  Past Medical History:  Diagnosis Date  . Anemia   . Anemia   . Arthritis    lower back, hips  . Blood transfusion without reported diagnosis   . CAD (coronary artery disease) FEB AND NOV 2009   6 STENTS  . Cancer ([place])    tumor back of neck-fibroushistocytoma  . Cataract   . Chronic  kidney disease    Stage III  . Coronary artery disease   . DDD (degenerative disc disease), lumbar   . Diabetes mellitus without complication ([place])    TYPE 2  . Herpes simplex   . Hyperlipidemia   . Hypertension    CONTROLLED ON MEDS  . Vertigo    occasional, no episodes in 2-3 months  . Vertigo    OCCASIONALLY  . Vitamin B 12 deficiency     Past Surgical History:  Procedure Laterality Date  . [place] STUDY N/A 02/04/2016   Procedure: [place] STUDY;  Surgeon: Lucilla Lame, MD;  Location: [facility];  Service: Endoscopy;  Laterality: N/A;  . ABDOMINAL HYSTERECTOMY    . BACK SURGERY     Lumbar spinal fusion 05/30/2015  . CARDIAC CATHETERIZATION  2009  . CATARACT EXTRACTION Left   . CATARACT EXTRACTION W/PHACO Right 03/19/2015   Procedure: CATARACT EXTRACTION PHACO AND INTRAOCULAR LENS PLACEMENT ([place]);  Surgeon: Leandrew Koyanagi, MD;  Location: [facility];  Service: Ophthalmology;  Laterality: Right;  DIABETIC - insulin and oral meds  . CORONARY STENT PLACEMENT  03/2008  . CYSTECTOMY Right    wrist  . ESOPHAGEAL MANOMETRY N/A 02/04/2016   Procedure: ESOPHAGEAL MANOMETRY (EM);  Surgeon: Lucilla Lame, MD;  Location: [facility];  Service: Endoscopy;  Laterality: N/A;  . ESOPHAGOGASTRODUODENOSCOPY (EGD) WITH PROPOFOL N/A 09/04/2015   Procedure: ESOPHAGOGASTRODUODENOSCOPY (EGD) WITH PROPOFOL;  Surgeon: Lucilla Lame, MD;  Location: [place];  Service: Endoscopy;  Laterality:  N/A;  INSULIN DEPENDENT DIABETIC  . EYE SURGERY Left    cataract extraction  . LUMBAR FUSION  2001, 2014, 2015  . [place]     2001, 2014, 2015  . TUMOR EXCISION     back of neck    Family History  Problem Relation Age of Onset  . Heart disease Father   . Diabetes Other   . Breast cancer Neg Hx     Social History Social History  Substance Use Topics  . Smoking status: Never Smoker  . Smokeless tobacco: Never Used  . Alcohol use 0.0 oz/week     Comment: 2-3 drinks  per year    Allergies  Allergen Reactions  . Beta Adrenergic Blockers Hives  . Celebrex [Celecoxib] Other (See Comments)    A lot of pressure in head  . Niacin Other (See Comments)    flushing  . Niacin And Related Other (See Comments)    Flushing and hot sensation  . Oxycodone Nausea Only  . Shellfish Allergy Nausea And Vomiting  . Vicodin [Hydrocodone-Acetaminophen] Other (See Comments)    Hypoglycemic episodes  . Heparin Rash    Current Outpatient Prescriptions  Medication Sig Dispense Refill  . acetaminophen (TYLENOL) 325 MG tablet Take 650 mg by mouth every 6 (six) hours as needed for moderate pain, fever or headache.     Marland Kitchen acyclovir (ZOVIRAX) 400 MG tablet as needed.     Marland Kitchen amLODipine (NORVASC) 5 MG tablet Take 5 mg by mouth daily. AM    . aspirin EC 81 MG tablet Take 81 mg by mouth at bedtime.    Marland Kitchen atorvastatin (LIPITOR) 40 MG tablet Take 40 mg by mouth at bedtime.     . Biotin 5000 MCG TABS Take 5,000 mcg by mouth daily. AM    . Calcium Carbonate-Vitamin D 600-400 MG-UNIT per tablet Take 1 tablet by mouth daily. AM    . clopidogrel (PLAVIX) 75 MG tablet Take 1 tablet (75 mg total) by mouth daily. 30 tablet 0  . Cyanocobalamin (RA VITAMIN B-12 TR) 1000 MCG TBCR Take 1 tablet by mouth daily. AM    . Ferrous Sulfate (IRON) 325 (65 FE) MG TABS Take 1 tablet by mouth daily. AM    . glipiZIDE (GLUCOTROL) 10 MG tablet Take 1 tablet by mouth 2 (two) times daily.  0  . Insulin Glargine (LANTUS SOLOSTAR) 100 UNIT/ML Solostar Pen Inject 36 Units into the skin daily. DEPENDING ON BLOOD SUGAR MAY TAKE AT NIGHT    . Insulin Pen Needle (ULTICARE MICRO PEN NEEDLES) 32G X 4 MM MISC     . losartan (COZAAR) 100 MG tablet Take 100 mg by mouth daily. AM     No current facility-administered medications for this visit.      Review of Systems A Multi-point review of systems was asked and was negative except for the findings documented in the history of present illness  Physical Exam Blood  pressure (!) 141/78, pulse 90, temperature 98.6 F (37 C), temperature source Oral, height 5\' 2"  (1.575 m), weight 84.9 kg (187 lb 3.2 oz). CONSTITUTIONAL: No acute distress. EYES: Pupils are equal, round, and reactive to light, Sclera are non-icteric. EARS, NOSE, MOUTH AND THROAT: The oropharynx is clear. The oral mucosa is pink and moist. Hearing is intact to voice. LYMPH NODES:  Lymph nodes in the neck are normal. RESPIRATORY:  Lungs are clear. There is normal respiratory effort, with equal breath sounds bilaterally, and without pathologic use of accessory muscles. CARDIOVASCULAR:  Heart is regular without murmurs, gallops, or rubs. GI: The abdomen is soft, nontender, and nondistended. There are no palpable masses. There is no hepatosplenomegaly. There are normal bowel sounds in all quadrants. Well-healed lower midline incision. GU: Rectal deferred.   MUSCULOSKELETAL: Normal muscle strength and tone. No cyanosis or edema.   SKIN: Turgor is good and there are no pathologic skin lesions or ulcers. NEUROLOGIC: Motor and sensation is grossly normal. Cranial nerves are grossly intact. PSYCH:  Oriented to person, place and time. Affect is normal.  Data Reviewed Barium swallow reviewed which as stated in the history of present illness shows the top 50% of her stomach residing within her chest. The barium does pass with ease so there is no evidence of obstruction. There is reflux of the barium contrast during the study. Recent EGD reviewed which also shows a large hiatal hernia. I have personally reviewed the patient's imaging, laboratory findings and medical records.    Assessment    Symptomatic hiatal hernia versus paraesophageal hernia    Plan    65 year old female with multiple medical problems with what appears to be a symptomatic hiatal hernia that is causing both worsening reflux symptoms as well as some of her shortness of breath due to chest displacement by the stomach. Discussed at  length with her about the surgical procedure of a laparoscopic hiatal/paraesophageal hernia repair where her stomach is pulled from her chest into her abdomen as well as the repair of the hiatal hernia, the pexy of her stomach to the abdominal wall, the anti-reflux partial wrap secondary to the inability to obtain manometry. Discussed at length that her risks from the surgery area and increase due to her cardiac history, her C. difficile history, her pulmonary history. Discussed that we would require medical, cardiac clearance prior to surgical intervention. However, patient has had recent visits with all the subspecialists and clearance should be easy to obtain. We will tentatively plan for surgical intervention for September 28 with Dr. Dahlia Byes to assist. All questions answered to the patient's satisfaction. She understands that if any subspecialists feel she needs to be cared for at a tertiary care center she'll be referred [facility].     Time spent with the patient was 60 minutes, with more than 50% of the time spent in face-to-face education, counseling and care coordination.     Clayburn Pert, MD FACS General Surgeon 02/13/2016, 9:45 AM

## 2016-03-11 NOTE — Interval H&P Note (Signed)
History and Physical Interval Note:  03/11/2016 6:47 AM  Dorothy Rogers  has presented today for surgery, with the diagnosis of N/A  The various methods of treatment have been discussed with the patient and family. After consideration of risks, benefits and other options for treatment, the patient has consented to  Procedure(s): LAPAROSCOPIC PARAESOPHAGEAL HERNIA REPAIR (N/A) as a surgical intervention .  The patient's history has been reviewed, patient examined, no change in status, stable for surgery.  I have reviewed the patient's chart and labs.  Questions were answered to the patient's satisfaction.     Clayburn Pert

## 2016-03-11 NOTE — Op Note (Signed)
Pre-operative Diagnosis: Paraesophageal hernia  Post-operative Diagnosis: Same  Procedure: Laparoscopic paraesophageal hernia repair, with antireflux procedure and gastropexy  Surgeon: Clayburn Pert   Assistants: Dr. Nestor Lewandowsky  Anesthesia: General endotracheal anesthesia  ASA Class: 2  Surgeon: Clayburn Pert, MD FACS  Anesthesia: Gen. with endotracheal tube  Assistant: Dr. Genevive Bi  Procedure Details  The patient was seen again in the Holding Room. The benefits, complications, treatment options, and expected outcomes were discussed with the patient. The risks of bleeding, infection, recurrence of symptoms, failure to resolve symptoms,  bowel injury, any of which could require further surgery were reviewed with the patient.   The patient was taken to Operating Room, identified as Dorothy Rogers and the procedure verified.  A Time Out was held and the above information confirmed.  Prior to the induction of general anesthesia, antibiotic prophylaxis was administered. VTE prophylaxis was in place. General endotracheal anesthesia was then administered and tolerated well. After the induction, the abdomen was prepped with Chloraprep and draped in the sterile fashion. The patient was positioned in the low lithotomy position.  The procedure began with a right upper quadrant Veress needle approach. This was done approximately 10 cm from the xiphoid process. The area was localized with a 50-50 measure lidocaine and Marcaine plain. Skin was incised with a 11 blade scalpel and access the peritoneum was obtained with a Veress needle. A saline drop test was performed which confirmed access the peritoneum and then the pneumoperitoneum was established to 15 mmHg. Using a 5 mm Optiview trocar we then visualized entry into the peritoneal cavity without any difficulty. Examination of the surrounding structures showed no evidence of damage from either the Veress needle or the Optiview trocar.  Reason  the proceeded to placed remainder trochars. A supraumbilical 5 mm trochars placed for our camera site. A right upper quadrant 11 mm trocar and right lateral 5 mm trocar were all placed under direct visualization. Next a lateral left trocar was placed for the liver retractor. These were all done after localization with the aforementioned local anesthetic. We then placed a diamond flex liver retractor through the most lateral right-sided incision which allowed the liver to be lifted up and out of the way for review of the hiatus. The cyst was performed the patient was placed in a steep head up position to allow visualization of the hiatus.   The majority the stomach was able to be bluntly dissected out from the chest without any difficulty. However the top corner of the stomach remained adherent to the hernia sac within the chest. Starting on the patient's left the left per was identified and dissected free with the Harmonic scalpel. This was taken up circumferentially to the midline. We then turned attention to the right and the gastrohepatic ligament was able to easily taken down with Harmonic scalpel for visualization of the patient's right crus. These were met in the midline above the stomach. We then released the short gastric vessels on the patient's left and took this up to the hiatal hernia defect. Going back and forth from left to right ureteral to eventually identify the esophagus and gradually remove the hiatal hernia sac from the chest using comminution blunt dissection and harmonic scalpel device. Once the entirety of the hernia sac was dissected free the patient was noted to have approximately 5 cm of intra-abdominal esophagus without any tension. The hernia sac was excised and removed through the 11 mm trocar. We erre able to place a Penrose drain  around the gastric body and create a retroesophageal window.  This point we turned our attention to the hiatal closure. A 42 French bougie dilator is  placed on the esophagus and the patient's posterior hiatal opening was noted to be approximately 2 cm in greatest length. This was closed using the Endo Stitch device. Using 30 Surgidac sutures the hiatus was closed primarily without any difficulty. Due to the size of the hernia contents in the chest the decision was made to reinforce this with a bio a hiatal hernia mesh. The mesh was soaked in saline and placed into the abdominal cavity where it was able to easily be placed behind the esophagus reinforcing our hiatal closure. It was secured on both sides with 0 Ethibond sutures. This was done intracorporeally and without any obvious difficulty.  With vital hernia repaired the decision was then made to create a loose fundoplication to assist in keeping the stomach out of the chest. The dilator was exchanged from a 42 to a 31 [place]. The previously created release of the fundus was able to be easily taken posterior to the esophagus where it was able to be created to a 360 wrap. Using 3 interrupted 0 Surgidek sutures a 2 cm floppy fundoplication was able to be created. The dilator was removed with ease and without, complications. Next the decision was made to do a gastropexy to the anterior abdominal wall. Using two 0 Vicryl sutures the stomach was grasped through the 11 mm trocar at the approximate correlation to the gastric body to the abdominal wall. These were taken out through the trocar. Our liver retractor and operative trochars were all removed under direct position without any difficulty. With the Vicryl sutures outside the body the fascia was closed in that incision with the sutures then secured over the fascial closure extra extracorporeally.  All of our operative sites were then re-localized the aforementioned local anesthetic and closed with a 4-0 Monocryl suture in a subcuticular fashion. The sites were then sealed with Dermabond. The patient tolerated the procedure well. She was awoken from general  endotracheal anesthesia and transferred to the PACU in good condition. All counts are correct at the end of the procedure and there were no obvious complications.  Findings: Paraesophageal hernia.   Estimated Blood Loss: 20 mL         Drains: None         Specimens: Paraesophageal hernia sac          Complications: None                  Condition: Good   Clayburn Pert, MD, FACS

## 2016-03-11 NOTE — Anesthesia Postprocedure Evaluation (Signed)
Anesthesia Post Note  Patient: Dorothy Rogers  Procedure(s) Performed: Procedure(s) (LRB): LAPAROSCOPIC PARAESOPHAGEAL HERNIA REPAIR (N/A)  Patient location during evaluation: PACU Anesthesia Type: General Level of consciousness: awake Pain management: pain level controlled Vital Signs Assessment: post-procedure vital signs reviewed and stable Respiratory status: spontaneous breathing Cardiovascular status: stable Anesthetic complications: no    Last Vitals:  Vitals:   03/11/16 1145 03/11/16 1153  BP:  (!) 136/58  Pulse: 100 98  Resp: 13 13  Temp:      Last Pain:  Vitals:   03/11/16 1138  TempSrc:   PainSc: 0-No pain                 VAN STAVEREN,Manav Pierotti

## 2016-03-11 NOTE — Transfer of Care (Signed)
Immediate Anesthesia Transfer of Care Note  Patient: Dorothy Rogers  Procedure(s) Performed: Procedure(s): LAPAROSCOPIC PARAESOPHAGEAL HERNIA REPAIR (N/A)  Patient Location: PACU  Anesthesia Type:General  Level of Consciousness: sedated and responds to stimulation  Airway & Oxygen Therapy: Patient Spontanous Breathing and Patient connected to face mask oxygen  Post-op Assessment: Report given to RN and Post -op Vital signs reviewed and stable  Post vital signs: Reviewed and stable  Last Vitals:  Vitals:   03/11/16 0622 03/11/16 1139  BP: (!) 155/79 (!) 132/55  Pulse: 99 93  Resp: 12 12  Temp: 36.6 C     Last Pain:  Vitals:   03/11/16 0622  TempSrc: Tympanic  PainSc: 2          Complications: No apparent anesthesia complications

## 2016-03-11 NOTE — Progress Notes (Signed)
Pt came to floor at 1341. VSS. Pt was asleep and resting quietly. Husband at beside. Per MD, leave foley in until tomorrow morning.

## 2016-03-11 NOTE — Anesthesia Procedure Notes (Signed)
Procedure Name: Intubation Date/Time: 03/11/2016 7:33 AM Performed by: Silvana Newness Pre-anesthesia Checklist: Patient identified, Emergency Drugs available, Suction available, Patient being monitored and Timeout performed Patient Re-evaluated:Patient Re-evaluated prior to inductionOxygen Delivery Method: Circle system utilized Preoxygenation: Pre-oxygenation with 100% oxygen Intubation Type: IV induction Ventilation: Mask ventilation without difficulty Laryngoscope Size: Mac and 3 Grade View: Grade I Tube type: Oral Number of attempts: 1 Airway Equipment and Method: Rigid stylet Placement Confirmation: ETT inserted through vocal cords under direct vision,  positive ETCO2 and breath sounds checked- equal and bilateral Secured at: 20 cm Tube secured with: Tape Dental Injury: Teeth and Oropharynx as per pre-operative assessment

## 2016-03-11 NOTE — Anesthesia Preprocedure Evaluation (Signed)
Anesthesia Evaluation  Patient identified by MRN, date of birth, ID band Patient awake    Reviewed: Allergy & Precautions, NPO status , Patient's Chart, lab work & pertinent test results  Airway Mallampati: II       Dental  (+) Teeth Intact   Pulmonary neg pulmonary ROS,    breath sounds clear to auscultation       Cardiovascular Exercise Tolerance: Good hypertension, Pt. on medications + CAD and + Peripheral Vascular Disease   Rhythm:Regular Rate:Normal     Neuro/Psych Anxiety    GI/Hepatic Neg liver ROS, hiatal hernia,   Endo/Other  diabetes, Well Controlled, Type 1, Insulin DependentMorbid obesity  Renal/GU negative Renal ROS     Musculoskeletal   Abdominal (+) + obese,   Peds  Hematology  (+) anemia ,   Anesthesia Other Findings   Reproductive/Obstetrics                             Anesthesia Physical Anesthesia Plan  ASA: III  Anesthesia Plan: General   Post-op Pain Management:    Induction: Intravenous  Airway Management Planned: Oral ETT  Additional Equipment:   Intra-op Plan:   Post-operative Plan: Extubation in OR  Informed Consent: I have reviewed the patients History and Physical, chart, labs and discussed the procedure including the risks, benefits and alternatives for the proposed anesthesia with the patient or authorized representative who has indicated his/her understanding and acceptance.     Plan Discussed with: CRNA  Anesthesia Plan Comments:         Anesthesia Quick Evaluation

## 2016-03-11 NOTE — Progress Notes (Signed)
POD # 0 VSS  Doing better C/p improved PE NAD Abd: soft, incisions c/d/i, no peritonitis  Doing well

## 2016-03-11 NOTE — Brief Op Note (Signed)
03/11/2016  11:32 AM  PATIENT:  Dorothy Rogers  65 y.o. female  PRE-OPERATIVE DIAGNOSIS:  Paraesophageal hernia  POST-OPERATIVE DIAGNOSIS:  Same  PROCEDURE:  Procedure(s): LAPAROSCOPIC PARAESOPHAGEAL HERNIA REPAIR (N/A)  SURGEON:  Surgeon(s) and Role:    * Clayburn Pert, MD - Primary    * Nestor Lewandowsky, MD - Assisting  PHYSICIAN ASSISTANT: Student  ASSISTANTS: none   ANESTHESIA:   general  EBL:  Total I/O In: 1000 [I.V.:1000] Out: 205 [Urine:200; Blood:5]  BLOOD ADMINISTERED:none  DRAINS: none   LOCAL MEDICATIONS USED:  MARCAINE   , XYLOCAINE  and Amount: 20 ml  SPECIMEN:  No Specimen  DISPOSITION OF SPECIMEN:  N/A  COUNTS:  YES  TOURNIQUET:  * No tourniquets in log *  DICTATION: .Dragon Dictation  PLAN OF CARE: Admit for overnight observation  PATIENT DISPOSITION:  PACU - hemodynamically stable.   Delay start of Pharmacological VTE agent (>24hrs) due to surgical blood loss or risk of bleeding: no

## 2016-03-12 ENCOUNTER — Observation Stay: Payer: Medicare Other

## 2016-03-12 DIAGNOSIS — K449 Diaphragmatic hernia without obstruction or gangrene: Secondary | ICD-10-CM | POA: Diagnosis not present

## 2016-03-12 LAB — COMPREHENSIVE METABOLIC PANEL
ALBUMIN: 3 g/dL — AB (ref 3.5–5.0)
ALK PHOS: 70 U/L (ref 38–126)
ALT: 220 U/L — ABNORMAL HIGH (ref 14–54)
ANION GAP: 3 — AB (ref 5–15)
AST: 148 U/L — ABNORMAL HIGH (ref 15–41)
BILIRUBIN TOTAL: 0.8 mg/dL (ref 0.3–1.2)
BUN: 35 mg/dL — ABNORMAL HIGH (ref 6–20)
CALCIUM: 8.4 mg/dL — AB (ref 8.9–10.3)
CO2: 25 mmol/L (ref 22–32)
Chloride: 112 mmol/L — ABNORMAL HIGH (ref 101–111)
Creatinine, Ser: 1.73 mg/dL — ABNORMAL HIGH (ref 0.44–1.00)
GFR calc non Af Amer: 30 mL/min — ABNORMAL LOW (ref >60)
GFR, EST AFRICAN AMERICAN: 35 mL/min — AB (ref >60)
GLUCOSE: 144 mg/dL — AB (ref 65–99)
POTASSIUM: 5 mmol/L (ref 3.5–5.1)
SODIUM: 140 mmol/L (ref 135–145)
TOTAL PROTEIN: 5.8 g/dL — AB (ref 6.5–8.1)

## 2016-03-12 LAB — CBC
HCT: 27 % — ABNORMAL LOW (ref 35.0–47.0)
Hemoglobin: 9.7 g/dL — ABNORMAL LOW (ref 12.0–16.0)
MCH: 32.2 pg (ref 26.0–34.0)
MCHC: 35.8 g/dL (ref 32.0–36.0)
MCV: 90 fL (ref 80.0–100.0)
PLATELETS: 154 10*3/uL (ref 150–440)
RBC: 3 MIL/uL — AB (ref 3.80–5.20)
RDW: 13.3 % (ref 11.5–14.5)
WBC: 10.3 10*3/uL (ref 3.6–11.0)

## 2016-03-12 LAB — GLUCOSE, CAPILLARY
GLUCOSE-CAPILLARY: 126 mg/dL — AB (ref 65–99)
GLUCOSE-CAPILLARY: 109 mg/dL — AB (ref 65–99)
GLUCOSE-CAPILLARY: 110 mg/dL — AB (ref 65–99)
Glucose-Capillary: 76 mg/dL (ref 65–99)
Glucose-Capillary: 258 mg/dL — ABNORMAL HIGH (ref 65–99)

## 2016-03-12 LAB — SURGICAL PATHOLOGY

## 2016-03-12 LAB — MAGNESIUM: Magnesium: 1.6 mg/dL — ABNORMAL LOW (ref 1.7–2.4)

## 2016-03-12 LAB — PHOSPHORUS: PHOSPHORUS: 3.9 mg/dL (ref 2.5–4.6)

## 2016-03-12 MED ORDER — SIMETHICONE 80 MG PO CHEW
80.0000 mg | CHEWABLE_TABLET | Freq: Four times a day (QID) | ORAL | Status: DC | PRN
  Filled 2016-03-12 (×2): qty 1

## 2016-03-12 MED ORDER — CLOPIDOGREL BISULFATE 75 MG PO TABS
75.0000 mg | ORAL_TABLET | ORAL | Status: DC
  Administered 2016-03-13: 75 mg via ORAL
  Filled 2016-03-12: qty 1

## 2016-03-12 MED ORDER — LACTATED RINGERS IV SOLN
INTRAVENOUS | Status: DC
  Administered 2016-03-12 (×3): via INTRAVENOUS

## 2016-03-12 MED ORDER — OXYCODONE HCL 5 MG/5ML PO SOLN
5.0000 mg | ORAL | Status: DC | PRN
  Administered 2016-03-12 – 2016-03-14 (×5): 5 mg via ORAL
  Filled 2016-03-12 (×5): qty 5

## 2016-03-12 MED ORDER — ASPIRIN EC 81 MG PO TBEC
81.0000 mg | DELAYED_RELEASE_TABLET | Freq: Every day | ORAL | Status: DC
  Administered 2016-03-13: 81 mg via ORAL
  Filled 2016-03-12: qty 1

## 2016-03-12 NOTE — Progress Notes (Signed)
1 Day Post-Op   Subjective:  Patient reports feeling better this morning than she did last night. Her chest discomfort is mostly resolved.  Vital signs in last 24 hours: Temp:  [97.4 F (36.3 C)-98.9 F (37.2 C)] 98.4 F (36.9 C) (10/13 1330) Pulse Rate:  [76-89] 76 (10/13 1330) Resp:  [12-17] 16 (10/13 1330) BP: (114-138)/(48-63) 114/56 (10/13 1330) SpO2:  [94 %-99 %] 95 % (10/13 1330) Last BM Date: 03/10/16  Intake/Output from previous day: 10/12 0701 - 10/13 0700 In: 1653.9 [I.V.:1603.9; IV Piggyback:50] Out: 1210 [Urine:1200; Blood:10]  GI: Abdomen soft, appropriately tender to palpation at incision sites, nondistended. Well approximated laparoscopic incision sites without evidence of erythema or drainage.  Lab Results:  CBC  Recent Labs  03/12/16 0419  WBC 10.3  HGB 9.7*  HCT 27.0*  PLT 154   CMP     Component Value Date/Time   NA 140 03/12/2016 0419   NA 141 05/14/2014 1842   K 5.0 03/12/2016 0419   K 4.3 05/14/2014 1842   CL 112 (H) 03/12/2016 0419   CL 109 (H) 05/14/2014 1842   CO2 25 03/12/2016 0419   CO2 23 05/14/2014 1842   GLUCOSE 144 (H) 03/12/2016 0419   GLUCOSE 154 (H) 05/14/2014 1842   BUN 35 (H) 03/12/2016 0419   BUN 20 (H) 05/14/2014 1842   CREATININE 1.73 (H) 03/12/2016 0419   CREATININE 1.51 (H) 05/14/2014 1842   CALCIUM 8.4 (L) 03/12/2016 0419   CALCIUM 8.4 (L) 05/14/2014 1842   PROT 5.8 (L) 03/12/2016 0419   PROT 6.4 05/14/2014 1842   ALBUMIN 3.0 (L) 03/12/2016 0419   ALBUMIN 2.6 (L) 05/14/2014 1842   AST 148 (H) 03/12/2016 0419   AST 23 05/14/2014 1842   ALT 220 (H) 03/12/2016 0419   ALT 19 05/14/2014 1842   ALKPHOS 70 03/12/2016 0419   ALKPHOS 82 05/14/2014 1842   BILITOT 0.8 03/12/2016 0419   BILITOT 0.8 05/14/2014 1842   GFRNONAA 30 (L) 03/12/2016 0419   GFRNONAA 37 (L) 05/14/2014 1842   GFRNONAA 34 (L) 10/18/2011 1944   GFRAA 35 (L) 03/12/2016 0419   GFRAA 45 (L) 05/14/2014 1842   GFRAA 40 (L) 10/18/2011 1944    PT/INR No results for input(s): LABPROT, INR in the last 72 hours.  Studies/Results: Dg Chest Port 1 View  Result Date: 03/11/2016 CLINICAL DATA:  Chest discomfort, difficulty swallowing EXAM: PORTABLE CHEST 1 VIEW COMPARISON:  CT chest 12/01/2015 FINDINGS: The heart size and mediastinal contours are within normal limits. Both lungs are clear. The visualized skeletal structures are unremarkable. Large hiatal hernia. IMPRESSION: No active disease. Large hiatal hernia. Electronically Signed   By: Kathreen Devoid   On: 03/11/2016 17:54   Dg Esophagus W/water Sol Cm  Result Date: 03/12/2016 CLINICAL DATA:  Status post hiatal hernia repair yesterday. The patient has a since a since something being stuck in the throat and also has chest fullness. EXAM: ESOPHOGRAM/BARIUM SWALLOW TECHNIQUE: Single contrast examination was performed using  Isovue-300 FLUOROSCOPY TIME:  Fluoroscopy Time:  0 minutes, 36 seconds Radiation Exposure Index (if provided by the fluoroscopic device): 1428.82 uGym2. Number of Acquired Spot Images: 2 +4 video loops COMPARISON:  Preoperative barium swallow examination of January 28, 2016 FINDINGS: The patient ingested water-soluble contrast without difficulty. Tertiary contractions were prominent throughout the esophagus. The large hiatal hernia was no longer evident. A small diverticulum at the site of the wrap was observed. The communicated freely with the main lumen of the esophagus. The wrap  did not appear too tight and allowed passage of the contrast into the stomach. A moderate amount of gas was pre-existing within the stomach. Rounded calcifications in the right upper quadrant are consistent with known gallstones. IMPRESSION: Status post hiatal hernia repair. The esophagus appears patent. 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 There is no evidence of leak. Pre-existing mild gaseous distention of the stomach.  Presbyesophagus. Large calcified gallstones Electronically Signed   By: David  Martinique M.D.   On: 03/12/2016 08:07    Assessment/Plan: 65 year old female postop day #1 status post laparoscopic paraesophageal hernia repair. Underwent a swallow study this morning which did not show any evidence of surgical failure, esophageal leakage, obstruction. Plan to start on clear liquid diet. She will remain on a clear liquid diet for approximately 2 weeks. Encourage ambulation, incentive spirometer, intake of liquids.   Clayburn Pert, MD FACS General Surgeon  03/12/2016

## 2016-03-12 NOTE — Care Management Obs Status (Signed)
[facility] NOTIFICATION   Patient Details  Name: Dorothy Rogers MRN: [id number] Date of Birth: 28-Oct-1950   Medicare Observation Status Notification Given:  Yes    Beau Fanny, RN 03/12/2016, 8:57 AM

## 2016-03-13 DIAGNOSIS — K449 Diaphragmatic hernia without obstruction or gangrene: Secondary | ICD-10-CM | POA: Diagnosis not present

## 2016-03-13 LAB — COMPREHENSIVE METABOLIC PANEL
ALBUMIN: 3.1 g/dL — AB (ref 3.5–5.0)
ALK PHOS: 73 U/L (ref 38–126)
ALT: 290 U/L — ABNORMAL HIGH (ref 14–54)
ANION GAP: 6 (ref 5–15)
AST: 151 U/L — ABNORMAL HIGH (ref 15–41)
BUN: 24 mg/dL — ABNORMAL HIGH (ref 6–20)
CALCIUM: 8.4 mg/dL — AB (ref 8.9–10.3)
CO2: 26 mmol/L (ref 22–32)
Chloride: 107 mmol/L (ref 101–111)
Creatinine, Ser: 1.77 mg/dL — ABNORMAL HIGH (ref 0.44–1.00)
GFR calc non Af Amer: 29 mL/min — ABNORMAL LOW (ref >60)
GFR, EST AFRICAN AMERICAN: 34 mL/min — AB (ref >60)
Glucose, Bld: 174 mg/dL — ABNORMAL HIGH (ref 65–99)
POTASSIUM: 4.3 mmol/L (ref 3.5–5.1)
SODIUM: 139 mmol/L (ref 135–145)
TOTAL PROTEIN: 6.1 g/dL — AB (ref 6.5–8.1)
Total Bilirubin: 1.1 mg/dL (ref 0.3–1.2)

## 2016-03-13 LAB — CBC
HCT: 31.2 % — ABNORMAL LOW (ref 35.0–47.0)
HEMOGLOBIN: 10.8 g/dL — AB (ref 12.0–16.0)
MCH: 31.6 pg (ref 26.0–34.0)
MCHC: 34.4 g/dL (ref 32.0–36.0)
MCV: 91.7 fL (ref 80.0–100.0)
PLATELETS: 159 10*3/uL (ref 150–440)
RBC: 3.41 MIL/uL — ABNORMAL LOW (ref 3.80–5.20)
RDW: 13.4 % (ref 11.5–14.5)
WBC: 8.5 10*3/uL (ref 3.6–11.0)

## 2016-03-13 LAB — GLUCOSE, CAPILLARY
GLUCOSE-CAPILLARY: 103 mg/dL — AB (ref 65–99)
Glucose-Capillary: 175 mg/dL — ABNORMAL HIGH (ref 65–99)
Glucose-Capillary: 213 mg/dL — ABNORMAL HIGH (ref 65–99)
Glucose-Capillary: 92 mg/dL (ref 65–99)

## 2016-03-13 MED ORDER — GLIPIZIDE 5 MG PO TABS: 10.0000 mg | ORAL_TABLET | Freq: Two times a day (BID) | ORAL | Status: DC

## 2016-03-13 MED ORDER — GLIPIZIDE 5 MG PO TABS
5.0000 mg | ORAL_TABLET | Freq: Two times a day (BID) | ORAL | Status: DC
  Administered 2016-03-13 – 2016-03-14 (×2): 5 mg via ORAL
  Filled 2016-03-13 (×2): qty 1

## 2016-03-13 MED ORDER — ATORVASTATIN CALCIUM 20 MG PO TABS
40.0000 mg | ORAL_TABLET | Freq: Every day | ORAL | Status: DC
  Administered 2016-03-13: 40 mg via ORAL
  Filled 2016-03-13: qty 2

## 2016-03-13 MED ORDER — ONDANSETRON 4 MG PO TBDP: 8.0000 mg | ORAL_TABLET | ORAL | Status: DC | PRN

## 2016-03-13 MED ORDER — SODIUM CHLORIDE 0.9 % IJ SOLN
INTRAMUSCULAR | Status: AC
  Filled 2016-03-13: qty 10

## 2016-03-13 NOTE — Progress Notes (Signed)
2 Days Post-Op   Subjective:  Patient reports that she is feeling somewhat better than yesterday but continues to have intermittent nausea and a feeling of wooziness. She has been tolerating her liquid diet and passing flatus. Her pain thus far has been well controlled but she still has intermittently been taking the IV medications.  Vital signs in last 24 hours: Temp:  [98.4 F (36.9 C)-99.2 F (37.3 C)] 99.2 F (37.3 C) (10/14 0919) Pulse Rate:  [76-93] 87 (10/14 0919) Resp:  [0-18] 18 (10/14 0512) BP: (114-130)/(46-60) 118/60 (10/14 0919) SpO2:  [91 %-95 %] 91 % (10/14 0512) Weight:  [89.4 kg (197 lb 1.3 oz)] 89.4 kg (197 lb 1.3 oz) (10/14 0604) Last BM Date: 03/10/16  Intake/Output from previous day: 10/13 0701 - 10/14 0700 In: 2572 [P.O.:1785; I.V.:787] Out: 550 [Urine:550]  GI: Abdomen soft, nondistended, appropriately tender to palpation at incision sites. Well approximated laparoscopic incision sites without evidence of erythema or drainage.  Lab Results:  CBC  Recent Labs  03/12/16 0419 03/13/16 0434  WBC 10.3 8.5  HGB 9.7* 10.8*  HCT 27.0* 31.2*  PLT 154 159   CMP     Component Value Date/Time   NA 139 03/13/2016 0434   NA 141 05/14/2014 1842   K 4.3 03/13/2016 0434   K 4.3 05/14/2014 1842   CL 107 03/13/2016 0434   CL 109 (H) 05/14/2014 1842   CO2 26 03/13/2016 0434   CO2 23 05/14/2014 1842   GLUCOSE 174 (H) 03/13/2016 0434   GLUCOSE 154 (H) 05/14/2014 1842   BUN 24 (H) 03/13/2016 0434   BUN 20 (H) 05/14/2014 1842   CREATININE 1.77 (H) 03/13/2016 0434   CREATININE 1.51 (H) 05/14/2014 1842   CALCIUM 8.4 (L) 03/13/2016 0434   CALCIUM 8.4 (L) 05/14/2014 1842   PROT 6.1 (L) 03/13/2016 0434   PROT 6.4 05/14/2014 1842   ALBUMIN 3.1 (L) 03/13/2016 0434   ALBUMIN 2.6 (L) 05/14/2014 1842   AST 151 (H) 03/13/2016 0434   AST 23 05/14/2014 1842   ALT 290 (H) 03/13/2016 0434   ALT 19 05/14/2014 1842   ALKPHOS 73 03/13/2016 0434   ALKPHOS 82 05/14/2014  1842   BILITOT 1.1 03/13/2016 0434   BILITOT 0.8 05/14/2014 1842   GFRNONAA 29 (L) 03/13/2016 0434   GFRNONAA 37 (L) 05/14/2014 1842   GFRNONAA 34 (L) 10/18/2011 1944   GFRAA 34 (L) 03/13/2016 0434   GFRAA 45 (L) 05/14/2014 1842   GFRAA 40 (L) 10/18/2011 1944   PT/INR No results for input(s): LABPROT, INR in the last 72 hours.  Studies/Results: Dg Chest Port 1 View  Result Date: 03/11/2016 CLINICAL DATA:  Chest discomfort, difficulty swallowing EXAM: PORTABLE CHEST 1 VIEW COMPARISON:  CT chest 12/01/2015 FINDINGS: The heart size and mediastinal contours are within normal limits. Both lungs are clear. The visualized skeletal structures are unremarkable. Large hiatal hernia. IMPRESSION: No active disease. Large hiatal hernia. Electronically Signed   By: Kathreen Devoid   On: 03/11/2016 17:54   Dg Esophagus W/water Sol Cm  Result Date: 03/12/2016 CLINICAL DATA:  Status post hiatal hernia repair yesterday. The patient has a since a since something being stuck in the throat and also has chest fullness. EXAM: ESOPHOGRAM/BARIUM SWALLOW TECHNIQUE: Single contrast examination was performed using  Isovue-300 FLUOROSCOPY TIME:  Fluoroscopy Time:  0 minutes, 36 seconds Radiation Exposure Index (if provided by the fluoroscopic device): 1428.82 uGym2. Number of Acquired Spot Images: 2 +4 video loops COMPARISON:  Preoperative barium swallow  examination of January 28, 2016 FINDINGS: The patient ingested water-soluble contrast without difficulty. Tertiary contractions were prominent throughout the esophagus. The large hiatal hernia was no longer evident. A small diverticulum at the site of the wrap was observed. The communicated freely with the main lumen of the esophagus. The wrap did not appear too tight and allowed passage of the contrast into the stomach. A moderate amount of gas was pre-existing within the stomach. Rounded calcifications in the right upper quadrant are consistent with known gallstones.  IMPRESSION: Status post hiatal hernia repair. The esophagus appears patent. 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 There is no evidence of leak. Pre-existing mild gaseous distention of the stomach. Presbyesophagus. Large calcified gallstones Electronically Signed   By: David  Martinique M.D.   On: 03/12/2016 08:07    Assessment/Plan: 65 year old female postop day #2 from a laparoscopic paraesophageal hernia repair. Progressing slowly. Discussed today that we would attempt to manage everything by mouth. I will stop all of her IV medications including IV fluids and she is to maintain oral hydration. I encouraged her to use her incentive spirometer and for her to ambulate. I will check with her later today to see if she is ready for discharge home this afternoon, most likely discharge home in the morning barring any setbacks.   Clayburn Pert, MD FACS General Surgeon  03/13/2016

## 2016-03-14 DIAGNOSIS — K449 Diaphragmatic hernia without obstruction or gangrene: Secondary | ICD-10-CM | POA: Diagnosis not present

## 2016-03-14 LAB — GLUCOSE, CAPILLARY
GLUCOSE-CAPILLARY: 185 mg/dL — AB (ref 65–99)
Glucose-Capillary: 167 mg/dL — ABNORMAL HIGH (ref 65–99)

## 2016-03-14 MED ORDER — PROMETHAZINE HCL 12.5 MG RE SUPP: 12.5000 mg | Freq: Four times a day (QID) | RECTAL | 0 refills | Status: DC | PRN

## 2016-03-14 MED ORDER — OXYCODONE HCL 5 MG/5ML PO SOLN: 5.0000 mg | ORAL | 0 refills | Status: DC | PRN

## 2016-03-14 MED ORDER — ONDANSETRON 4 MG PO TBDP: 4.0000 mg | ORAL_TABLET | Freq: Four times a day (QID) | ORAL | 0 refills | Status: DC | PRN

## 2016-03-14 MED ORDER — SIMETHICONE 80 MG PO CHEW: 80.0000 mg | CHEWABLE_TABLET | Freq: Four times a day (QID) | ORAL | 0 refills | Status: DC | PRN

## 2016-03-14 MED ORDER — ONDANSETRON 8 MG PO TBDP: 8.0000 mg | ORAL_TABLET | ORAL | 0 refills | Status: DC | PRN

## 2016-03-14 MED ORDER — INSULIN ASPART 100 UNIT/ML ~~LOC~~ SOLN: 0.0000 [IU] | Freq: Three times a day (TID) | SUBCUTANEOUS | 11 refills | Status: DC

## 2016-03-14 MED ORDER — GLIPIZIDE 5 MG PO TABS: 5.0000 mg | ORAL_TABLET | Freq: Two times a day (BID) | ORAL | 0 refills | Status: DC

## 2016-03-14 NOTE — Discharge Summary (Signed)
Patient ID: Dorothy Rogers MRN: [id number] DOB/AGE: 1950-08-24 65 y.o.  Admit date: 03/11/2016 Discharge date: 03/14/2016  Discharge Diagnoses:  Paraesophageal Hernia  Procedures Performed: Laparoscopic Paraesophageal hernia repair.  Discharged Condition: good  Hospital Course: Patient taken to the OR for a scheduled para-esophageal hernia repair. Tolerated the procedure well. Was able to tolerate a clear liquid diet and her home medications at the time of discharge.  Discharge Orders: Discharge home, OK to shower, DO NOT SUBMERGE, continue clear liquid diet until advanced by physician.  Disposition: 01-Home or Self Care  Discharge Medications:   Medication List    STOP taking these medications   acetaminophen-codeine 300-30 MG tablet Commonly known as:  TYLENOL #3   Biotin 5000 MCG Tabs   Calcium Carbonate-Vitamin D 600-400 MG-UNIT tablet   Iron 325 (65 Fe) MG Tabs   RA VITAMIN B-12 TR 1000 MCG Tbcr Generic drug:  Cyanocobalamin     TAKE these medications   acyclovir 400 MG tablet Commonly known as:  ZOVIRAX Take 400 mg by mouth as needed.   amLODipine 5 MG tablet Commonly known as:  NORVASC Take 5 mg by mouth every morning. AM   aspirin EC 81 MG tablet Take 81 mg by mouth at bedtime.   atorvastatin 40 MG tablet Commonly known as:  LIPITOR Take 40 mg by mouth at bedtime.   clopidogrel 75 MG tablet Commonly known as:  PLAVIX Take 1 tablet (75 mg total) by mouth daily. What changed:  when to take this   glipiZIDE 5 MG tablet Commonly known as:  GLUCOTROL Take 1 tablet (5 mg total) by mouth 2 (two) times daily before a meal. What changed:  medication strength  how much to take   LANTUS SOLOSTAR 100 UNIT/ML Solostar Pen Generic drug:  Insulin Glargine Inject 36 Units into the skin daily. And 2-6 UNITS (DEPENDING ON BLOOD SUGAR) AT NIGHT   losartan 100 MG tablet Commonly known as:  COZAAR Take 100 mg by mouth every morning. AM   ondansetron  4 MG tablet Commonly known as:  ZOFRAN Take 1 tablet (4 mg total) by mouth daily as needed for nausea or vomiting.   oxyCODONE 5 MG/5ML solution Commonly known as:  ROXICODONE Take 5 mLs (5 mg total) by mouth every 4 (four) hours as needed for moderate pain or severe pain.   promethazine 12.5 MG suppository Commonly known as:  PHENERGAN Place 1 suppository (12.5 mg total) rectally every 6 (six) hours as needed for nausea or vomiting.   sertraline 50 MG tablet Commonly known as:  ZOLOFT Take 1 tablet (50 mg total) by mouth daily.   simethicone 80 MG chewable tablet Commonly known as:  MYLICON Chew 1 tablet (80 mg total) by mouth every 6 (six) hours as needed for flatulence (abdominal gas).   ULTICARE MICRO PEN NEEDLES 32G X 4 MM Misc Generic drug:  Insulin Pen Needle Use as directed.   umeclidinium-vilanterol 62.5-25 MCG/INH Aepb Commonly known as:  ANORO ELLIPTA Inhale 1 puff into the lungs daily.   ZOFRAN ODT 8 MG disintegrating tablet Generic drug:  ondansetron Take 8 mg by mouth as needed for nausea or vomiting.        Follwup: Follow-up Information    Clayburn Pert, MD. Go in 4 day(s).   Specialty:  General Surgery Why:  Report to clinic at 11:00 AM on Thursday Contact information: [address] [place] [address] [phone]           Signed:  Clayburn Pert 03/14/2016, 8:26 AM

## 2016-03-14 NOTE — Discharge Instructions (Signed)
Laparoscopic Nissen Fundoplication, Care After Refer to this sheet in the next few weeks. These instructions provide you with information about caring for yourself after your procedure. Your health care provider may also give you more specific instructions. Your treatment has been planned according to current medical practices, but problems sometimes occur. Call your health care provider if you have any problems or questions after your procedure. WHAT TO EXPECT AFTER THE PROCEDURE After your procedure, it is common to have:   Difficulty swallowing (dysphagia).  Excess gas (bloating). HOME CARE INSTRUCTIONS Medicines  Take medicines only as directed by your health care provider.  Do not drive or operate heavy machinery while taking pain medicine. Incision Care  There are many different ways to close and cover an incision, including stitches (sutures), skin glue, and adhesive strips. Follow your health care provider's instructions about:  Incision care.  Bandage (dressing) changes and removal.  Incision closure removal.  Check your incision areas every day for signs of infection. Watch for:  Redness, swelling, or pain.  Fluid, blood, or pus.  Do not take baths, swim, or use a hot tub until your health care provider approves. Take showers as directed by your health care provider. Eating and Drinking  Follow your health care provider's instructions about eating.  You may need to follow a liquid-only diet for 2 weeks, followed by a diet of soft foods for 2 weeks.  You should return to your usual diet gradually.  Drink enough fluid to keep your urine clear or pale yellow. Activities  Return to your normal activities as directed by your health care provider. Ask your health care provider what activities are safe for you.  Avoid strenuous exercise.  Do not lift anything that is heavier than 10 lb (4.5 kg).  Ask your health care provider when you can:  Return to sexual  activity.  Drive.  Go back to work. SEEK MEDICAL CARE IF:  You have a fever.  Your pain gets worse or is not helped by medicine.  You have frequent nausea or vomiting.  You have continued abdominal bloating.  You have an ongoing (persistent) cough.  You have redness, swelling, or pain in any incision areas.  You have fluid, blood, or pus coming from any incisions. SEEK IMMEDIATE MEDICAL CARE IF:  You have trouble breathing.  You are unable to swallow.  You have persistent vomiting.  You have blood in your vomit.  You have severe abdominal pain.   This information is not intended to replace advice given to you by your health care provider. Make sure you discuss any questions you have with your health care provider.   Document Released: 01/08/2004 Document Revised: 06/07/2014 Document Reviewed: 01/16/2014 Elsevier Interactive Patient Education 2016 [place] Liquid Diet A clear liquid diet is a short-term diet that is prescribed to provide the necessary fluid and basic energy you need when you can have nothing else. The clear liquid diet consists of liquids or solids that will become liquid at room temperature. You should be able to see through the liquid. There are many reasons that you may be restricted to clear liquids, such as:  When you have a sudden-onset (acute) condition that occurs before or after surgery.  To help your body slowly get adjusted to food again after a long period when you were unable to have food.  Replacement of fluids when you have a diarrheal disease.  When you are going to have certain exams, such as  a colonoscopy, in which instruments are inserted inside your body to look at parts of your digestive system. WHAT CAN I HAVE? A clear liquid diet does not provide all the nutrients you need. It is important to choose a variety of the following items to get as many nutrients as possible:  Vegetable juices that do not have  pulp.  Fruit juices and fruit drinks that do not have pulp.  Coffee (regular or decaffeinated), tea, or soda at the discretion of your health care provider.  Clear bouillon, broth, or strained broth-based soups.  High-protein and flavored gelatins.  Sugar or honey.  Ices or frozen ice pops that do not contain milk. If you are not sure whether you can have certain items, you should ask your health care provider. You may also ask your health care provider if there are any other clear liquid options.   This information is not intended to replace advice given to you by your health care provider. Make sure you discuss any questions you have with your health care provider.   Document Released: 05/17/2005 Document Revised: 05/22/2013 Document Reviewed: 04/13/2013 Elsevier Interactive Patient Education [employer].

## 2016-03-14 NOTE — Final Progress Note (Signed)
3 Days Post-Op   Subjective:  Patient did well overnight. Still feels week and has some chest discomfort at the site of the hernia but states it is getting better. Denies any acute events, tolerated diet and oral medications.  Vital signs in last 24 hours: Temp:  [98.6 F (37 C)-99.8 F (37.7 C)] 98.8 F (37.1 C) (10/15 0530) Pulse Rate:  [83-98] 83 (10/15 0530) Resp:  [16-21] 21 (10/14 2120) BP: (118-133)/(57-78) 131/59 (10/15 0530) SpO2:  [91 %-96 %] 91 % (10/15 0530) Last BM Date: 03/10/16  Intake/Output from previous day: 10/14 0701 - 10/15 0700 In: 1700 [P.O.:1700] Out: -   GI: soft, non-tender; bowel sounds normal; no masses,  no organomegaly Lap incisions are well approximated without sign of infection.  Lab Results:  CBC  Recent Labs  03/12/16 0419 03/13/16 0434  WBC 10.3 8.5  HGB 9.7* 10.8*  HCT 27.0* 31.2*  PLT 154 159   CMP     Component Value Date/Time   NA 139 03/13/2016 0434   NA 141 05/14/2014 1842   K 4.3 03/13/2016 0434   K 4.3 05/14/2014 1842   CL 107 03/13/2016 0434   CL 109 (H) 05/14/2014 1842   CO2 26 03/13/2016 0434   CO2 23 05/14/2014 1842   GLUCOSE 174 (H) 03/13/2016 0434   GLUCOSE 154 (H) 05/14/2014 1842   BUN 24 (H) 03/13/2016 0434   BUN 20 (H) 05/14/2014 1842   CREATININE 1.77 (H) 03/13/2016 0434   CREATININE 1.51 (H) 05/14/2014 1842   CALCIUM 8.4 (L) 03/13/2016 0434   CALCIUM 8.4 (L) 05/14/2014 1842   PROT 6.1 (L) 03/13/2016 0434   PROT 6.4 05/14/2014 1842   ALBUMIN 3.1 (L) 03/13/2016 0434   ALBUMIN 2.6 (L) 05/14/2014 1842   AST 151 (H) 03/13/2016 0434   AST 23 05/14/2014 1842   ALT 290 (H) 03/13/2016 0434   ALT 19 05/14/2014 1842   ALKPHOS 73 03/13/2016 0434   ALKPHOS 82 05/14/2014 1842   BILITOT 1.1 03/13/2016 0434   BILITOT 0.8 05/14/2014 1842   GFRNONAA 29 (L) 03/13/2016 0434   GFRNONAA 37 (L) 05/14/2014 1842   GFRNONAA 34 (L) 10/18/2011 1944   GFRAA 34 (L) 03/13/2016 0434   GFRAA 45 (L) 05/14/2014 1842   GFRAA 40  (L) 10/18/2011 1944   PT/INR No results for input(s): LABPROT, INR in the last 72 hours.  Studies/Results: No results found.  Assessment/Plan: 65 year old female s/p lap paraesophageal hernia repair. Doing well. Discharge home today.   Clayburn Pert, MD FACS General Surgeon  03/14/2016

## 2016-03-14 NOTE — Progress Notes (Signed)
IV was removed. Discharge instructions, follow-up appointments, and prescriptions were provided to the pt and husband at bedside. All questions were answered. The pt was taken downstairs via wheelchair by NT.

## 2016-03-17 ENCOUNTER — Telehealth: Payer: Self-pay | Admitting: General Surgery

## 2016-03-17 NOTE — Telephone Encounter (Signed)
Patient's husband has called and states that patient is having neck pain. The location of the pain is in the back of the neck described at the top of the spine. Patient does not complain of any other symptoms at this time. No fever, nausea or vomiting. Patient is taking pain medication as prescribed.    Please call patient's husband.

## 2016-03-17 NOTE — Telephone Encounter (Signed)
Spoke with Dr. Adonis Huguenin regarding the symptoms of the patient. He does not believe that this is due to surgery and recommends following up with Primary Care Physician for neck pain.  Returned phone call to patient and he husband at this time and explained this to them. They both verbalized understanding of this and will be at post-op appointment tomorrow.

## 2016-03-18 ENCOUNTER — Ambulatory Visit (INDEPENDENT_AMBULATORY_CARE_PROVIDER_SITE_OTHER): Payer: Medicare Other | Admitting: General Surgery

## 2016-03-18 ENCOUNTER — Other Ambulatory Visit
Admission: RE | Admit: 2016-03-18 | Discharge: 2016-03-18 | Disposition: A | Discharge status: Home / Self Care | Payer: Medicare Other | Source: Ambulatory Visit | Attending: General Surgery | Admitting: General Surgery

## 2016-03-18 ENCOUNTER — Encounter: Payer: Self-pay | Admitting: General Surgery

## 2016-03-18 ENCOUNTER — Telehealth: Payer: Self-pay

## 2016-03-18 VITALS — BP 156/87 | HR 112 | Temp 98.5°F | Ht 62.0 in | Wt 181.4 lb

## 2016-03-18 DIAGNOSIS — Z4889 Encounter for other specified surgical aftercare: Secondary | ICD-10-CM

## 2016-03-18 DIAGNOSIS — R000 Tachycardia, unspecified: Secondary | ICD-10-CM | POA: Diagnosis present

## 2016-03-18 LAB — CBC WITH DIFFERENTIAL/PLATELET
BASOS ABS: 0 10*3/uL (ref 0–0.1)
BASOS PCT: 1 %
Eosinophils Absolute: 0.1 10*3/uL (ref 0–0.7)
Eosinophils Relative: 1 %
HEMATOCRIT: 33.3 % — AB (ref 35.0–47.0)
HEMOGLOBIN: 11.6 g/dL — AB (ref 12.0–16.0)
Lymphocytes Relative: 10 %
Lymphs Abs: 0.8 10*3/uL — ABNORMAL LOW (ref 1.0–3.6)
MCH: 31 pg (ref 26.0–34.0)
MCHC: 34.8 g/dL (ref 32.0–36.0)
MCV: 89.1 fL (ref 80.0–100.0)
MONO ABS: 0.5 10*3/uL (ref 0.2–0.9)
Monocytes Relative: 6 %
NEUTROS ABS: 6.2 10*3/uL (ref 1.4–6.5)
NEUTROS PCT: 82 %
Platelets: 203 10*3/uL (ref 150–440)
RBC: 3.74 MIL/uL — AB (ref 3.80–5.20)
RDW: 13.1 % (ref 11.5–14.5)
WBC: 7.5 10*3/uL (ref 3.6–11.0)

## 2016-03-18 LAB — COMPREHENSIVE METABOLIC PANEL
ALK PHOS: 112 U/L (ref 38–126)
ALT: 67 U/L — ABNORMAL HIGH (ref 14–54)
ANION GAP: 9 (ref 5–15)
AST: 30 U/L (ref 15–41)
Albumin: 3.6 g/dL (ref 3.5–5.0)
BILIRUBIN TOTAL: 1.3 mg/dL — AB (ref 0.3–1.2)
BUN: 15 mg/dL (ref 6–20)
CALCIUM: 8.9 mg/dL (ref 8.9–10.3)
CO2: 22 mmol/L (ref 22–32)
Chloride: 105 mmol/L (ref 101–111)
Creatinine, Ser: 1.58 mg/dL — ABNORMAL HIGH (ref 0.44–1.00)
GFR calc non Af Amer: 33 mL/min — ABNORMAL LOW (ref >60)
GFR, EST AFRICAN AMERICAN: 38 mL/min — AB (ref >60)
Glucose, Bld: 255 mg/dL — ABNORMAL HIGH (ref 65–99)
Potassium: 4.8 mmol/L (ref 3.5–5.1)
Sodium: 136 mmol/L (ref 135–145)
TOTAL PROTEIN: 7.6 g/dL (ref 6.5–8.1)

## 2016-03-18 NOTE — Telephone Encounter (Signed)
Received lab results from today. Reviewed labs with Dr. Adonis Huguenin and he would like to keep plan that was discussed in clinic today.  Call made to patient at this time and reviewed labs with her and her husband. Explained that plan will stay the same and she is to call with any questions or concerns. She verbalized understanding.

## 2016-03-18 NOTE — Patient Instructions (Signed)
Please go to the [place] to have your labs drawn at this time. I will call you as soon as I have the results.  Advance your diet to the Full Liquid diet, please see information below.  You will need to resume Humalog Sliding Scale depending on your glucoses, you may give Humalog up to four times daily prior to meals.   Continue your dose of glipizide as you have been. If you resume your full dosage of glipizide and are still having consistently high glucose with your sliding scale insulin, please call our office for further instructions.  We will see you back in 2 weeks for further follow-up.    Full Liquid Diet A full liquid diet may be used:   To help you transition from a clear liquid diet to a soft diet.   When your body is healing and can only tolerate foods that are easy to digest.  Before or after certain a procedure, test, or surgery (such as stomach or intestinal surgeries).   If you have trouble swallowing or chewing.  A full liquid diet includes fluids and foods that are liquid or will become liquid at room temperature. The full liquid diet gives you the proteins, fluids, salts, and minerals that you need for energy. If you continue this diet for more than 72 hours, talk to your health care provider about how many calories you need to consume. If you continue the diet for more than 5 days, talk to your health care provider about taking a multivitamin or a nutritional supplement. WHAT DO I NEED TO KNOW ABOUT A FULL LIQUID DIET?  You may have any liquid.  You may have any food that becomes a liquid at room temperature. The food is considered a liquid if it can be poured off a spoon at room temperature.  Drink one serving of citrus or vitamin C-enriched fruit juice daily. WHAT FOODS CAN I EAT? Grains Any grain food that can be pureed in soup (such as crackers, pasta, and rice). Hot cereal (such as farina or oatmeal) that has been blended. Talk to your health care  provider or dietitian about these foods. Vegetables Pulp-free tomato or vegetable juice. Vegetables pureed in soup.  Fruits Fruit juice, including nectars and juices with pulp. Meats and Other Protein Sources Eggs in custard, eggnog mix, and eggs used in ice cream or pudding. Strained meats, like in baby food, may be allowed. Consult your health care provider.  Dairy Milk and milk-based beverages, including milk shakes and instant breakfast mixes. Smooth yogurt. Pureed cottage cheese. Avoid these foods if they are not well tolerated. Beverages All beverages, including liquid nutritional supplements. Ask your health care provider if you can have carbonated beverages. They may not be well tolerated. Condiments Iodized salt, pepper, spices, and flavorings. Cocoa powder. Vinegar, ketchup, yellow mustard, smooth sauces (such as hollandaise, cheese sauce, or white sauce), and soy sauce. Sweets and Desserts Custard, smooth pudding. Flavored gelatin. Tapioca, junket. Plain ice cream, sherbet, fruit ices. Frozen ice pops, frozen fudge pops, pudding pops, and other frozen bars with cream. Syrups, including chocolate syrup. Sugar, honey, jelly.  Fats and Oils Margarine, butter, cream, sour cream, and oils. Other Broth and cream soups. Strained, broth-based soups. The items listed above may not be a complete list of recommended foods or beverages. Contact your dietitian for more options.  WHAT FOODS CAN I NOT EAT? Grains All breads. Grains are not allowed unless they are pureed into soup. Vegetables Vegetables are not  allowed unless they are juiced, or cooked and pureed into soup. Fruits Fruits are not allowed unless they are juiced. Meats and Other Protein Sources Any meat or fish. Cooked or raw eggs. Nut butters.  Dairy Cheese.  Condiments Stone ground mustards. Fats and Oils Fats that are coarse or chunky. Sweets and Desserts Ice cream or other frozen desserts that have any solids in them  or on top, such as nuts, chocolate chips, and pieces of cookies. Cakes. Cookies. Candy. Others Soups with chunks or pieces in them. The items listed above may not be a complete list of foods and beverages to avoid. Contact your dietitian for more information.   This information is not intended to replace advice given to you by your health care provider. Make sure you discuss any questions you have with your health care provider.   Document Released: 05/17/2005 Document Revised: 05/22/2013 Document Reviewed: 03/22/2013 Elsevier Interactive Patient Education [employer].

## 2016-03-19 NOTE — Progress Notes (Signed)
Outpatient Surgical Follow Up  03/19/2016  Dorothy Rogers is an 65 y.o. female.   Chief Complaint  Patient presents with  . Routine Post Op    Laparoscopic Paraesophageal Hernia Repair (03/11/16)- Dr. Adonis Huguenin    HPI: 65 year old female returns to clinic for follow-up 1 week status post laparoscopic paraesophageal hernia repair. She's been tolerating her clear liquid diet and having bowel function. She has been belching and having nausea every time she takes pain medications. She is primarily taking pain medications for neck pain and not chest or abdominal pain. She denies any fevers, chills, vomiting, chest pain, shortness of breath, diarrhea, constipation. She states she appears to be breathing easier but is becoming very tired of the clear liquid diet.  Past Medical History:  Diagnosis Date  . Anemia   . Anemia   . Arthritis    lower back, hips  . Blood transfusion without reported diagnosis   . CAD (coronary artery disease) FEB AND NOV 2009   6 STENTS  . 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 Cataract   . Chronic kidney disease    Stage III  . Coronary artery disease   . DDD (degenerative disc disease), lumbar   . Diabetes mellitus without complication ([place])    TYPE 2  . Herpes simplex   . Hyperlipidemia   . Hypertension    CONTROLLED ON MEDS  . Vertigo    occasional, no episodes in 2-3 months  . Vertigo    OCCASIONALLY  . Vitamin B 12 deficiency     Past Surgical History:  Procedure Laterality Date  . [place] STUDY N/A 02/04/2016   Procedure: [place] STUDY;  Surgeon: Lucilla Lame, MD;  Location: [facility] ENDOSCOPY;  Service: Endoscopy;  Laterality: N/A;  . ABDOMINAL HYSTERECTOMY    . BACK SURGERY     Lumbar spinal fusion 05/30/2015  . CARDIAC CATHETERIZATION  2009  . CATARACT EXTRACTION Left   . CATARACT EXTRACTION W/PHACO Right 03/19/2015   Procedure: CATARACT EXTRACTION PHACO AND INTRAOCULAR LENS PLACEMENT ([place]);  Surgeon: Leandrew Koyanagi, MD;  Location: [place];  Service: Ophthalmology;  Laterality: Right;  DIABETIC - insulin and oral meds  . CORONARY STENT PLACEMENT  03/2008  . CYSTECTOMY Right    wrist  . ESOPHAGEAL MANOMETRY N/A 02/04/2016   Procedure: ESOPHAGEAL MANOMETRY (EM);  Surgeon: Lucilla Lame, MD;  Location: [facility] ENDOSCOPY;  Service: Endoscopy;  Laterality: N/A;  . ESOPHAGOGASTRODUODENOSCOPY (EGD) WITH PROPOFOL N/A 09/04/2015   Procedure: ESOPHAGOGASTRODUODENOSCOPY (EGD) WITH PROPOFOL;  Surgeon: Lucilla Lame, MD;  Location: [facility];  Service: Endoscopy;  Laterality: N/A;  INSULIN DEPENDENT DIABETIC  . EYE SURGERY Left    cataract extraction  . LUMBAR FUSION  2001, 2014, 2015  . [place]     2001, 2014, 2015  . TUMOR EXCISION     back of neck    Family History  Problem Relation Age of Onset  . Heart disease Father   . Diabetes Other   . Breast cancer Neg Hx     Social History:  reports that she has never smoked. She has never used smokeless tobacco. She reports that she drinks alcohol. She reports that she does not use drugs.  Allergies:  Allergies  Allergen Reactions  . Tramadol Swelling  . Beta Adrenergic Blockers Hives  . Celebrex [Celecoxib] Other (See Comments)    A lot of pressure in head  . Niacin Other (See Comments)  flushing  . Niacin And Related Other (See Comments)    Flushing and hot sensation  . Oxycodone Nausea Only  . Shellfish Allergy Nausea And Vomiting  . Vicodin [Hydrocodone-Acetaminophen] Other (See Comments)    Hypoglycemic episodes  . Heparin Rash    Medications reviewed.    ROS A multipoint review of systems was completed, all pertinent positives and negatives are documented within the history of present illness and the remainder are negative.   BP (!) 156/87   Pulse (!) 112   Temp 98.5 F (36.9 C) (Oral)   Ht 5\' 2"  (1.575 m)   Wt 82.3 kg (181 lb 6.4 oz)   BMI 33.18 kg/m   Physical Exam Gen.: No acute distress Chest:  Clear to auscultation Heart: Tachycardic Abdomen: Soft, nontender, nondistended. Well approximate lap scopic incision sites unable to erythema or drainage.    No results found for this or any previous visit (from the past 48 hour(s)). No results found.  Assessment/Plan:  1. Aftercare following surgery 65 year old female doing well from a postoperative standpoint in that she is tolerating her diet without any vomiting or reflux. Discussed advancing her diet to a full liquid diet for the coming weeks. She will stay on this for the next couple of weeks and be seen again in clinic before her diet is advanced again. She voiced understanding.  2. Tachycardia Patient with new tachycardia in the postoperative setting. She was restarted on her aspirin and Plavix after surgery. She appears to feel a little weak and is tachycardic so we will recheck labs today to ensure she is not suffering from postoperative bleed. We'll call with results of further intervention required. - CBC with Differential; Future - Comprehensive metabolic panel; Future - CBC with Differential - Comprehensive metabolic panel     Clayburn Pert, MD [facility] General Surgeon  03/19/2016,8:34 AM

## 2016-03-23 ENCOUNTER — Telehealth: Payer: Self-pay | Admitting: General Surgery

## 2016-03-23 NOTE — Telephone Encounter (Signed)
Patient has some questions regarding her diet and vitiamins. Please call.

## 2016-03-24 NOTE — Telephone Encounter (Signed)
Call made to patient at this time. No answer. Left voicemail for return phone call.

## 2016-03-24 NOTE — Telephone Encounter (Signed)
Patient would only like to speak to [employer].

## 2016-03-24 NOTE — Telephone Encounter (Signed)
Spoke with patient and her husband at this time. She wanted to know if she could restart her B-12 pills and her vitamins.  She has a follow up appointment on Monday 03/29/16 with Dr.Woodham and was advise to follow his plan of Full liquid diet until she speaks with him.

## 2016-03-24 NOTE — Telephone Encounter (Signed)
Spoke with patient at this time. She is currently using a high Sliding Scale and glucoses have still been averaging 200 and is not having difficulty with hypoglycemia. She is up to a whole tablet of Glipizide twice daily and has not restarted Lantus per Dr. Reginal Lutes instructions.  She continues to take Plavix and aspirin as prescribed.  She states that she has had malaise and headaches after eating each time. She states that she is not experiencing this otherwise. The only other symptom that patient has complaints of is fatigue intermittently throughout the day.  I spoke with Dr. Azalee Course in regards to this patient. She would like to restart Lantus at 10units at [facility] with a goal of 180. Increase glucerna to TID, increase protein intake as much as possible daily still remaining on full liquid diet, push water intake to a goal of 72 ounces daily, and restart Vitamins, especially B-12 and Iron. I explained all of this information to the patient and her husband in an extensive conversation. All questions were answered and they verbalized understanding of this.  Encouraged patient and husband to return phone call with any further concerns, worsening symptoms, or if glucose levels were not decreasing with the 10 units of Lantus. They were both in agreement with this plan.

## 2016-03-27 ENCOUNTER — Encounter: Payer: Self-pay | Admitting: Emergency Medicine

## 2016-03-27 ENCOUNTER — Emergency Department
Admission: EM | Admit: 2016-03-27 | Discharge: 2016-03-28 | Disposition: A | Discharge status: Home / Self Care | Payer: Medicare Other | Attending: Emergency Medicine | Admitting: Emergency Medicine

## 2016-03-27 DIAGNOSIS — Z7982 Long term (current) use of aspirin: Secondary | ICD-10-CM | POA: Insufficient documentation

## 2016-03-27 DIAGNOSIS — R531 Weakness: Secondary | ICD-10-CM

## 2016-03-27 DIAGNOSIS — Z79899 Other long term (current) drug therapy: Secondary | ICD-10-CM | POA: Diagnosis not present

## 2016-03-27 DIAGNOSIS — R1013 Epigastric pain: Secondary | ICD-10-CM | POA: Insufficient documentation

## 2016-03-27 DIAGNOSIS — I129 Hypertensive chronic kidney disease with stage 1 through stage 4 chronic kidney disease, or unspecified chronic kidney disease: Secondary | ICD-10-CM | POA: Diagnosis not present

## 2016-03-27 DIAGNOSIS — E860 Dehydration: Secondary | ICD-10-CM | POA: Diagnosis not present

## 2016-03-27 DIAGNOSIS — E039 Hypothyroidism, unspecified: Secondary | ICD-10-CM

## 2016-03-27 DIAGNOSIS — N183 Chronic kidney disease, stage 3 (moderate): Secondary | ICD-10-CM | POA: Diagnosis not present

## 2016-03-27 DIAGNOSIS — E1122 Type 2 diabetes mellitus with diabetic chronic kidney disease: Secondary | ICD-10-CM | POA: Insufficient documentation

## 2016-03-27 DIAGNOSIS — I2510 Atherosclerotic heart disease of native coronary artery without angina pectoris: Secondary | ICD-10-CM | POA: Diagnosis not present

## 2016-03-27 DIAGNOSIS — Z794 Long term (current) use of insulin: Secondary | ICD-10-CM | POA: Insufficient documentation

## 2016-03-27 DIAGNOSIS — R42 Dizziness and giddiness: Secondary | ICD-10-CM | POA: Diagnosis present

## 2016-03-27 LAB — BASIC METABOLIC PANEL
Anion gap: 11 (ref 5–15)
BUN: 24 mg/dL — ABNORMAL HIGH (ref 6–20)
CALCIUM: 9.2 mg/dL (ref 8.9–10.3)
CO2: 23 mmol/L (ref 22–32)
CREATININE: 1.56 mg/dL — AB (ref 0.44–1.00)
Chloride: 105 mmol/L (ref 101–111)
GFR calc non Af Amer: 34 mL/min — ABNORMAL LOW (ref >60)
GFR, EST AFRICAN AMERICAN: 39 mL/min — AB (ref >60)
Glucose, Bld: 174 mg/dL — ABNORMAL HIGH (ref 65–99)
Potassium: 4.6 mmol/L (ref 3.5–5.1)
Sodium: 139 mmol/L (ref 135–145)

## 2016-03-27 LAB — CBC
HCT: 36.8 % (ref 35.0–47.0)
Hemoglobin: 12.6 g/dL (ref 12.0–16.0)
MCH: 30.7 pg (ref 26.0–34.0)
MCHC: 34.4 g/dL (ref 32.0–36.0)
MCV: 89.2 fL (ref 80.0–100.0)
Platelets: 334 10*3/uL (ref 150–440)
RBC: 4.12 MIL/uL (ref 3.80–5.20)
RDW: 13.5 % (ref 11.5–14.5)
WBC: 10.2 10*3/uL (ref 3.6–11.0)

## 2016-03-27 LAB — TROPONIN I: Troponin I: <0.03 ng/mL (ref <0.03)

## 2016-03-27 LAB — URINALYSIS COMPLETE WITH MICROSCOPIC (ARMC ONLY)
Bilirubin Urine: NEGATIVE
Glucose, UA: NEGATIVE mg/dL
Hgb urine dipstick: NEGATIVE
Ketones, ur: NEGATIVE mg/dL
Nitrite: NEGATIVE
Protein, ur: NEGATIVE mg/dL
Specific Gravity, Urine: 1.006 (ref 1.005–1.030)
pH: 7 (ref 5.0–8.0)

## 2016-03-27 MED ORDER — ENSURE ENLIVE PO LIQD
1.0000 | Freq: Once | ORAL | Status: AC
  Administered 2016-03-27: 237 mL via ORAL

## 2016-03-27 MED ORDER — ONDANSETRON HCL 4 MG/2ML IJ SOLN
4.0000 mg | Freq: Once | INTRAMUSCULAR | Status: AC
  Administered 2016-03-27: 4 mg via INTRAVENOUS
  Filled 2016-03-27: qty 2

## 2016-03-27 MED ORDER — FENTANYL CITRATE (PF) 100 MCG/2ML IJ SOLN
50.0000 ug | Freq: Once | INTRAMUSCULAR | Status: AC
  Administered 2016-03-27: 50 ug via INTRAVENOUS
  Filled 2016-03-27: qty 2

## 2016-03-27 MED ORDER — SODIUM CHLORIDE 0.9 % IV BOLUS (SEPSIS)
1000.0000 mL | Freq: Once | INTRAVENOUS | Status: AC
  Administered 2016-03-27: 1000 mL via INTRAVENOUS

## 2016-03-27 NOTE — ED Notes (Signed)
Pt states unable to tolerate ensure, dr. Kerman Passey to be informed

## 2016-03-27 NOTE — ED Triage Notes (Signed)
Patient arrives to ED via POV. Patient had hernia repair surgery 2 weeks ago. Patient has been feeling weak for the past 3 days but today when she woke up she felt very dizzy. Patient also c/o nausea. HR 125 in triage. Patient A&O x4.

## 2016-03-27 NOTE — ED Provider Notes (Addendum)
[facility] Emergency Department Provider Note  Time seen: 7:50 PM  I have reviewed the triage vital signs and the nursing notes.   HISTORY  Chief Complaint Dizziness and Nausea    HPI Dorothy Rogers is a 65 y.o. female with a past medical history of arthritis, anemia, diabetes, hypertension, hyperlipidemia, 2 weeks status post hiatal hernia operation who presents to the emergency department with lightheadedness and dizziness. According to the patient she has been on a clear liquid diet with that was just advance to a full liquid diet several days ago. Patient states she has been feeling very weak, getting lightheaded and dizzy especially when standing. States she has been trying to keep up with her fluids, they want or drinking 72 ounces per day, she states she is not drinking close to this amount. Patient states mild epigastric pain, but this is improved since the surgery. Denies any worsening discomfort. States nausea but again unchanged since the surgery she is prescribed Zofran for home use, denies any vomiting. Patient states she feels that she is dehydrated.  Past Medical History:  Diagnosis Date  . Anemia   . Anemia   . Arthritis    lower back, hips  . Blood transfusion without reported diagnosis   . CAD (coronary artery disease) FEB AND NOV 2009   6 STENTS  . 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 Cataract   . Chronic kidney disease    Stage III  . Coronary artery disease   . DDD (degenerative disc disease), lumbar   . Diabetes mellitus without complication ([place])    TYPE 2  . Herpes simplex   . Hyperlipidemia   . Hypertension    CONTROLLED ON MEDS  . Vertigo    occasional, no episodes in 2-3 months  . Vertigo    OCCASIONALLY  . Vitamin B 12 deficiency     Patient Active Problem List   Diagnosis Date Noted  . 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 Non-intractable cyclical vomiting with nausea   . Congenital esophageal  defect   . Hiatal hernia   . Gastritis   . Diarrhea 07/10/2015  . Diabetes mellitus ([place]) 07/02/2015  . Adjustment disorder 06/16/2015  . Abdominal pain 06/16/2015  . C. difficile colitis 06/16/2015  . Intractable nausea and vomiting 06/09/2015  . Dehydration 06/09/2015  . Type 2 diabetes mellitus ([place]) 06/09/2015  . HLD (hyperlipidemia) 06/09/2015  . HTN (hypertension) 06/09/2015  . CAD (coronary artery disease) 06/09/2015  . CKD (chronic kidney disease), stage III 06/09/2015  . Thrombocytopenia ([place]) 05/30/2015  . Lumbar canal stenosis 05/30/2015  . Disc disease with myelopathy, lumbar 05/30/2015  . Presence of stent in coronary artery 05/30/2015  . 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 Status post lumbar spinal fusion 12/25/2014  . DDD (degenerative disc disease), lumbar 11/18/2014  . Lumbar post-laminectomy syndrome 11/18/2014  . Facet syndrome, lumbar 11/18/2014  . Piriformis syndrome of left side 11/18/2014  . Status post lumbar spine operation 05/09/2014  . Adiposity 04/23/2014  . Thoracic and lumbosacral neuritis 03/27/2013  . H/O arthrodesis 03/27/2013  . Peripheral vascular disease ([place]) 03/22/2013  . Hypomagnesemia 03/22/2013  . Absolute anemia 03/22/2013    Past Surgical History:  Procedure Laterality Date  . [place] STUDY N/A 02/04/2016   Procedure: [place] STUDY;  Surgeon: Lucilla Lame, MD;  Location: [facility] ENDOSCOPY;  Service: Endoscopy;  Laterality: N/A;  . ABDOMINAL HYSTERECTOMY    . BACK SURGERY  Lumbar spinal fusion 05/30/2015  . CARDIAC CATHETERIZATION  2009  . CATARACT EXTRACTION Left   . CATARACT EXTRACTION W/PHACO Right 03/19/2015   Procedure: CATARACT EXTRACTION PHACO AND INTRAOCULAR LENS PLACEMENT ([place]);  Surgeon: Leandrew Koyanagi, MD;  Location: [place];  Service: Ophthalmology;  Laterality: Right;  DIABETIC - insulin and oral meds  . CORONARY STENT PLACEMENT  03/2008  . CYSTECTOMY Right    wrist  . ESOPHAGEAL  MANOMETRY N/A 02/04/2016   Procedure: ESOPHAGEAL MANOMETRY (EM);  Surgeon: Lucilla Lame, MD;  Location: [facility] ENDOSCOPY;  Service: Endoscopy;  Laterality: N/A;  . ESOPHAGOGASTRODUODENOSCOPY (EGD) WITH PROPOFOL N/A 09/04/2015   Procedure: ESOPHAGOGASTRODUODENOSCOPY (EGD) WITH PROPOFOL;  Surgeon: Lucilla Lame, MD;  Location: [place];  Service: Endoscopy;  Laterality: N/A;  INSULIN DEPENDENT DIABETIC  . EYE SURGERY Left    cataract extraction  . LUMBAR FUSION  2001, 2014, 2015  . [place]     2001, 2014, 2015  . TUMOR EXCISION     back of neck    Prior to Admission medications   Medication Sig Start Date End Date Taking? Authorizing Provider  acyclovir (ZOVIRAX) 400 MG tablet Take 400 mg by mouth as needed.  08/07/15  Yes Historical Provider, MD  amLODipine (NORVASC) 5 MG tablet Take 5 mg by mouth every morning. AM 08/29/14  Yes Historical Provider, MD  aspirin EC 81 MG tablet Take 81 mg by mouth at bedtime.   Yes Historical Provider, MD  atorvastatin (LIPITOR) 40 MG tablet Take 40 mg by mouth at bedtime.  08/16/14  Yes Historical Provider, MD  clopidogrel (PLAVIX) 75 MG tablet Take 1 tablet (75 mg total) by mouth daily. 07/11/15  Yes Sital Mody, MD  glipiZIDE (GLUCOTROL) 5 MG tablet Take 1 tablet (5 mg total) by mouth 2 (two) times daily before a meal. Patient taking differently: Take 10 mg by mouth 2 (two) times daily before a meal.  03/14/16  Yes Clayburn Pert, MD  insulin lispro (HUMALOG) 100 UNIT/ML injection Inject 16 Units into the skin 3 (three) times daily with meals.    Yes Historical Provider, MD  losartan (COZAAR) 100 MG tablet Take 100 mg by mouth every morning. AM 02/05/14  Yes Historical Provider, MD  ondansetron (ZOFRAN ODT) 8 MG disintegrating tablet Take 8 mg by mouth as needed for nausea or vomiting.   Yes Historical Provider, MD  oxyCODONE (ROXICODONE) 5 MG/5ML solution Take 5 mLs (5 mg total) by mouth every 4 (four) hours as needed for moderate pain or severe pain.  03/14/16  Yes Clayburn Pert, MD  Insulin Glargine (LANTUS SOLOSTAR) 100 UNIT/ML Solostar Pen Inject 36 Units into the skin daily. And 2-6 UNITS (DEPENDING ON BLOOD SUGAR) AT NIGHT 02/12/15   Historical Provider, MD  Insulin Pen Needle (ULTICARE MICRO PEN NEEDLES) 32G X 4 MM MISC Use as directed. 08/20/15   Historical Provider, MD    Allergies  Allergen Reactions  . Tramadol Swelling  . Beta Adrenergic Blockers Hives  . Celebrex [Celecoxib] Other (See Comments)    A lot of pressure in head  . Niacin Other (See Comments)    flushing  . Niacin And Related Other (See Comments)    Flushing and hot sensation  . Oxycodone Nausea Only  . Shellfish Allergy Nausea And Vomiting  . Vicodin [Hydrocodone-Acetaminophen] Other (See Comments)    Hypoglycemic episodes  . Heparin Rash    Family History  Problem Relation Age of Onset  . Heart disease Father   . Diabetes Other   .  Breast cancer Neg Hx     Social History Social History  Substance Use Topics  . Smoking status: Never Smoker  . Smokeless tobacco: Never Used  . Alcohol use 0.0 oz/week     Comment: 2-3 drinks per year    Review of Systems Constitutional: Negative for fever. Cardiovascular: Negative for chest pain. Respiratory: Negative for shortness of breath. Gastrointestinal: Mild epigastric pain, improved since the surgery. Nausea but denies vomiting. Genitourinary: Negative for dysuria. Neurological: Negative for headache 10-point ROS otherwise negative.  ____________________________________________   PHYSICAL EXAM:  VITAL SIGNS: ED Triage Vitals  Enc Vitals Group     BP 03/27/16 1752 125/83     Pulse Rate 03/27/16 1752 (!) 125     Resp 03/27/16 1752 18     Temp 03/27/16 1752 98.2 F (36.8 C)     Temp src --      SpO2 03/27/16 1752 98 %     Weight 03/27/16 1752 176 lb (79.8 kg)     Height 03/27/16 1752 5\' 2"  (1.575 m)     Head Circumference --      Peak Flow --      Pain Score 03/27/16 1753 0     Pain Loc  --      Pain Edu? --      Excl. in [place]? --     Constitutional: Alert and oriented. Well appearing and in no distress. Eyes: Normal exam ENT   Head: Normocephalic and atraumatic.   Mouth/Throat: Mucous membranes are moist. Cardiovascular: Normal rate, regular rhythm. No murmur Respiratory: Normal respiratory effort without tachypnea nor retractions. Breath sounds are clear  Gastrointestinal: Soft and nontender. No distention.  Musculoskeletal: Nontender with normal range of motion in all extremities.  Neurologic:  Normal speech and language. No gross focal neurologic deficits Skin:  Skin is warm, dry and intact.  Psychiatric: Mood and affect are normal.   ____________________________________________    EKG  EKG reviewed and interpreted by myself shows sinus tachycardia 119 bpm, narrow QRS, normal axis, normal intervals, nonspecific but no concerning ST changes.  ____________________________________________   INITIAL IMPRESSION / ASSESSMENT AND PLAN / ED COURSE  Pertinent labs & imaging results that were available during my care of the patient were reviewed by me and considered in my medical decision making (see chart for details).  The patient presents emergency department with feelings of generalized weakness, dizziness/lightheadedness upon standing. Patient's labs are largely unchanged. Patient is tachycardic, appears to be more orthostatic as her heart rate declines upon resting. I have added on cardiac enzymes. Patient's labs otherwise are largely within normal limits/baseline. No ketones in the urine. Creatinine unchanged from 10 days ago. We will IV hydrate and close to monitor while awaiting lab results.  Patient's labs are largely within normal limits besides mild dehydration, anion gap of 11. Plan to discharge home. I discussed with the patient and family encourage encouraging increased oral fluids and following up with her doctor and surgeon. Family and patient are  agreeable. Husband states the patient has been drinking liquids shakes that are 120 cal per shake one or 2 per day. I discussed with the husband she needs to be drinking high calorie meal replacement shakes 2-3 per day in addition to fluids and other foods. Husband was unaware of this. It appears the patient has likely been getting 5-6 on a calories per day, when she needs over 2000 per day. Husband states he will go to a health supply store tomorrow to obtain the  shakes. We will attempt to obtain something similar in the ER for the patient. At this point with a largely normal workup I do not believe the patient requires admission to the hospital, she appears well, and I feel that her symptoms are largely nutrition based.  ----------------------------------------- 11:57 PM on 03/27/2016 -----------------------------------------  Patient continues to state that she just does not feel well enough to go home. She cannot describe what doesn't feel well. She states she just feels too weak. I discussed admission to the hospital however the husband states they do not want her admitted unless we can guarantee that the insurance will pay for the admission. I told the husband that there is no way I can guarantee that the insurance company will pay for the admission as there is currently no abnormalities in her workup. Husband states he does not want to be admitted to the hospital, wishes for continued ER treatment and wants to talk to a case manager when they arrived in the morning. Again at this time I do not believe that there is any acute abnormality to warrant an admission in last the patient feels she is too weak to ambulate at home. However the patient is refusing admission until they can talk to a case Freight forwarder. Patient care signed out to Dr. Archie Balboa, case manager consultation pending in the morning.  ____________________________________________   FINAL CLINICAL IMPRESSION(S) / ED DIAGNOSES  Generalized  weakness Dehydration    Harvest Dark, MD 03/27/16 2209    Harvest Dark, MD 03/27/16 2249    Harvest Dark, MD 03/27/16 2359

## 2016-03-27 NOTE — ED Notes (Signed)
Patient ambulated to and from room commode with a steady gait.

## 2016-03-27 NOTE — ED Notes (Signed)
[employer] has been contacted to bring a chocolate meal replacement drink to the ED per Dr. Lucilla Lame request.

## 2016-03-27 NOTE — ED Notes (Signed)
Patient's husband said that the patient cannot go home because she is not any better. Dr. Kerman Passey is aware.

## 2016-03-27 NOTE — Discharge Instructions (Addendum)
Please seek medical attention for any high fevers, chest pain, shortness of breath, change in behavior, persistent vomiting, bloody stool or any other new or concerning symptoms.  

## 2016-03-28 DIAGNOSIS — E860 Dehydration: Secondary | ICD-10-CM | POA: Diagnosis not present

## 2016-03-28 LAB — GLUCOSE, CAPILLARY
GLUCOSE-CAPILLARY: 262 mg/dL — AB (ref 65–99)
GLUCOSE-CAPILLARY: 308 mg/dL — AB (ref 65–99)

## 2016-03-28 LAB — T4, FREE: Free T4: 0.96 ng/dL (ref 0.61–1.12)

## 2016-03-28 LAB — TSH: TSH: 8.297 u[IU]/mL — AB (ref 0.350–4.500)

## 2016-03-28 MED ORDER — ONDANSETRON HCL 4 MG/2ML IJ SOLN
INTRAMUSCULAR | Status: AC
  Administered 2016-03-28: 4 mg via INTRAVENOUS
  Filled 2016-03-28: qty 2

## 2016-03-28 MED ORDER — METOCLOPRAMIDE HCL 10 MG PO TABS: 10.0000 mg | ORAL_TABLET | Freq: Three times a day (TID) | ORAL | 0 refills | Status: DC | PRN

## 2016-03-28 MED ORDER — LEVOTHYROXINE SODIUM 25 MCG PO TABS: 25.0000 ug | ORAL_TABLET | Freq: Every day | ORAL | 0 refills | Status: DC

## 2016-03-28 MED ORDER — SODIUM CHLORIDE 0.9 % IV BOLUS (SEPSIS)
1000.0000 mL | Freq: Once | INTRAVENOUS | Status: AC
  Administered 2016-03-28: 1000 mL via INTRAVENOUS

## 2016-03-28 MED ORDER — LEVOTHYROXINE SODIUM 50 MCG PO TABS
25.0000 ug | ORAL_TABLET | Freq: Every day | ORAL | Status: DC
  Administered 2016-03-28: 25 ug via ORAL
  Filled 2016-03-28: qty 1

## 2016-03-28 MED ORDER — ENSURE ENLIVE PO LIQD
1.0000 | Freq: Once | ORAL | Status: AC
  Administered 2016-03-28: 237 mL via ORAL

## 2016-03-28 MED ORDER — METOCLOPRAMIDE HCL 5 MG/ML IJ SOLN
10.0000 mg | Freq: Once | INTRAMUSCULAR | Status: AC
  Administered 2016-03-28: 10 mg via INTRAVENOUS
  Filled 2016-03-28: qty 2

## 2016-03-28 MED ORDER — ONDANSETRON HCL 4 MG/2ML IJ SOLN
4.0000 mg | Freq: Once | INTRAMUSCULAR | Status: AC
  Administered 2016-03-28: 4 mg via INTRAVENOUS

## 2016-03-28 NOTE — ED Notes (Signed)
After consulting with Dr. Kerman Passey and family, family has decided they wish to stay until morning when situation can be evaluated by case management.  Per pt, she does not require any home medications tonight but is concerned about her blood glucose.  Per Dr. Kerman Passey, pt instructed to finish ensure she was given, then BG will be checked and corrected as need.  Pt and family agreeable with plan.  Charge nurse contacted regarding getting a recliner for comfort for pt's husband, per charge nurse, okay to provide, recliner brought to room for husband. Pt continues to drink ensure slowly in bed, NAD noted.

## 2016-03-28 NOTE — ED Notes (Signed)
MD Goodman at bedside. 

## 2016-03-28 NOTE — ED Provider Notes (Signed)
TSH was elevated. Think this could explain some of the patient's symptoms. Discussed this with patient and husband. They were agreeable to trial synthroid and follow up with PCP.   Nance Pear, MD 03/28/16 [phone]

## 2016-03-28 NOTE — ED Notes (Signed)
Pt reports improved nausea, but continued neck pain and funny feeling in head, but states tolerable at this time.  Pt told more ensure is in the fridge and to call when she feels she can have another.  Husband continues to be at bedside.  Pt NAD at this time, resting comfortably

## 2016-03-28 NOTE — ED Notes (Signed)
Pt reports feeling nauseated after ensure, VORB for 4mg  zofran which was given, pt's HR and BP noted to be increased, pt's BG checked 308, per pt's husband, pt's BG normally in lower 200s.  Dr. Archie Balboa to be notified

## 2016-03-28 NOTE — ED Notes (Signed)
Pt ambulatory to toilet with minimal assistance.  

## 2016-03-28 NOTE — ED Notes (Signed)
MD Archie Balboa back to bedside to explain discharge and why admission at this time is not beneficial to the patient.

## 2016-03-28 NOTE — ED Notes (Signed)
Pt verbalized understanding of discharge instructions. Pt concerned about discharge due to husband being sole caregiver. Pt stable and NAD at this time.

## 2016-03-29 ENCOUNTER — Encounter: Payer: Self-pay | Admitting: General Surgery

## 2016-03-29 ENCOUNTER — Ambulatory Visit (INDEPENDENT_AMBULATORY_CARE_PROVIDER_SITE_OTHER): Payer: Medicare Other | Admitting: General Surgery

## 2016-03-29 VITALS — BP 160/84 | HR 125 | Temp 98.3°F | Wt 179.0 lb

## 2016-03-29 DIAGNOSIS — Z4889 Encounter for other specified surgical aftercare: Secondary | ICD-10-CM

## 2016-03-29 LAB — T3, FREE: T3 FREE: 2.9 pg/mL (ref 2.0–4.4)

## 2016-03-29 NOTE — Patient Instructions (Addendum)
You will be able to start the soft mechanical diet on 04/02/2016. You will have to do this for two weeks. Remember that you are not able to have any carbonation. After that, you will be able to get back to your regular diet. We will see you back in two weeks. Dr. Adonis Huguenin also wants you to start your Lantus to 10 Units in the morning until you gradually get to where you were before surgery.  Soft-Food Meal Plan A soft-food meal plan includes foods that are safe and easy to swallow. This meal plan typically is used:  If you are having trouble chewing or swallowing foods.  As a transition meal plan after only having had liquid meals for a long period. WHAT DO I NEED TO KNOW ABOUT THE SOFT-FOOD MEAL PLAN? A soft-food meal plan includes tender foods that are soft and easy to chew and swallow. In most cases, bite-sized pieces of food are easier to swallow. A bite-sized piece is about  inch or smaller. Foods in this plan do not need to be ground or pureed. Foods that are very hard, crunchy, or sticky should be avoided. Also, breads, cereals, yogurts, and desserts with nuts, seeds, or fruits should be avoided. WHAT FOODS CAN I EAT? Grains Rice and wild rice. Moist bread, dressing, pasta, and noodles. Well-moistened dry or cooked cereals, such as farina (cooked wheat cereal), oatmeal, or grits. Biscuits, breads, muffins, pancakes, and waffles that have been well moistened. Vegetables Shredded lettuce. Cooked, tender vegetables, including potatoes without skins. Vegetable juices. Broths or creamed soups made with vegetables that are not stringy or chewy. Strained tomatoes (without seeds). Fruits Canned or well-cooked fruits. Soft (ripe), peeled fresh fruits, such as peaches, nectarines, kiwi, cantaloupe, honeydew melon, and watermelon (without seeds). Soft berries with small seeds, such as strawberries. Fruit juices (without pulp). Meats and Other Protein Sources Moist, tender, lean beef. Mutton. Lamb.  Veal. Chicken. [place]. Liver. Ham. Fish without bones. Eggs. Dairy Milk, milk drinks, and cream. Plain cream cheese and cottage cheese. Plain yogurt. Sweets/Desserts Flavored gelatin desserts. Custard. Plain ice cream, frozen yogurt, sherbet, milk shakes, and malts. Plain cakes and cookies. Plain hard candy.  Other Butter, margarine (without trans fat), and cooking oils. Mayonnaise. Cream sauces. Mild spices, salt, and sugar. Syrup, molasses, honey, and jelly. The items listed above may not be a complete list of recommended foods or beverages. Contact your dietitian for more options. WHAT FOODS ARE NOT RECOMMENDED? Grains Dry bread, toast, crackers that have not been moistened. Coarse or dry cereals, such as bran, granola, and shredded wheat. Tough or chewy crusty breads, such as [place] bread or baguettes. Vegetables Corn. Raw vegetables except shredded lettuce. Cooked vegetables that are tough or stringy. Tough, crisp, fried potatoes and potato skins. Fruits Fresh fruits with skins or seeds or both, such as apples, pears, or grapes. Stringy, high-pulp fruits, such as papaya, pineapple, coconut, or mango. Fruit leather, fruit roll-ups, and all dried fruits. Meats and Other Protein Sources Sausages and hot dogs. Meats with gristle. Fish with bones. Nuts, seeds, and chunky peanut or other nut butters. Sweets/Desserts Cakes or cookies that are very dry or chewy.  The items listed above may not be a complete list of foods and beverages to avoid. Contact your dietitian for more information.   This information is not intended to replace advice given to you by your health care provider. Make sure you discuss any questions you have with your health care provider.   Document Released: 08/24/2007 Document  Revised: 05/22/2013 Document Reviewed: 04/13/2013 Elsevier Interactive Patient Education [employer].

## 2016-03-29 NOTE — Progress Notes (Signed)
Outpatient Surgical Follow Up  03/29/2016  Dorothy Rogers is an 65 y.o. female.   Chief Complaint  Patient presents with  . Routine Post Op    Laparoscopic Paraesophageal Hernia Repair (03/11/16)- Dr. Adonis Huguenin    HPI: 65 year old female returns to clinic now 3 weeks status post paraesophageal hernia repair. From a hernia repair standpoint she has been doing well with minimal pain, improved breathing, resolution of reflux. However from medical standpoint she continues to have issues with diabetic control, maintaining hydration, no jugular level. She was in the ER over the weekend secondary to dehydration and was found to be severely decreased thyroid function. She currently denies any fevers, chills, nausea, vomiting, chest pain, short of breath, diarrhea, constipation. She does continue to have intermittent neck discomfort and has an overall feeling of decreased energy. She states no matter how much she eats she does not feel full and she has been very lethargic. Her blood sugars have remained elevated and she has not started Lantus as was discussed by our clinic.  Past Medical History:  Diagnosis Date  . Anemia   . Anemia   . Arthritis    lower back, hips  . Blood transfusion without reported diagnosis   . CAD (coronary artery disease) FEB AND NOV 2009   6 STENTS  . 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 Cataract   . Chronic kidney disease    Stage III  . Coronary artery disease   . DDD (degenerative disc disease), lumbar   . Diabetes mellitus without complication ([place])    TYPE 2  . Herpes simplex   . Hyperlipidemia   . Hypertension    CONTROLLED ON MEDS  . Vertigo    occasional, no episodes in 2-3 months  . Vertigo    OCCASIONALLY  . Vitamin B 12 deficiency     Past Surgical History:  Procedure Laterality Date  . [place] STUDY N/A 02/04/2016   Procedure: [place] STUDY;  Surgeon: Lucilla Lame, MD;  Location: [facility] ENDOSCOPY;  Service: Endoscopy;   Laterality: N/A;  . ABDOMINAL HYSTERECTOMY    . BACK SURGERY     Lumbar spinal fusion 05/30/2015  . CARDIAC CATHETERIZATION  2009  . CATARACT EXTRACTION Left   . CATARACT EXTRACTION W/PHACO Right 03/19/2015   Procedure: CATARACT EXTRACTION PHACO AND INTRAOCULAR LENS PLACEMENT ([place]);  Surgeon: Leandrew Koyanagi, MD;  Location: [place];  Service: Ophthalmology;  Laterality: Right;  DIABETIC - insulin and oral meds  . CORONARY STENT PLACEMENT  03/2008  . CYSTECTOMY Right    wrist  . ESOPHAGEAL MANOMETRY N/A 02/04/2016   Procedure: ESOPHAGEAL MANOMETRY (EM);  Surgeon: Lucilla Lame, MD;  Location: [facility] ENDOSCOPY;  Service: Endoscopy;  Laterality: N/A;  . ESOPHAGOGASTRODUODENOSCOPY (EGD) WITH PROPOFOL N/A 09/04/2015   Procedure: ESOPHAGOGASTRODUODENOSCOPY (EGD) WITH PROPOFOL;  Surgeon: Lucilla Lame, MD;  Location: [place];  Service: Endoscopy;  Laterality: N/A;  INSULIN DEPENDENT DIABETIC  . EYE SURGERY Left    cataract extraction  . LUMBAR FUSION  2001, 2014, 2015  . [place]     2001, 2014, 2015  . TUMOR EXCISION     back of neck    Family History  Problem Relation Age of Onset  . Heart disease Father   . Diabetes Other   . Breast cancer Neg Hx     Social History:  reports that she has never smoked. She has never used smokeless tobacco. She reports that she drinks  alcohol. She reports that she does not use drugs.  Allergies:  Allergies  Allergen Reactions  . Tramadol Swelling  . Beta Adrenergic Blockers Hives  . Celebrex [Celecoxib] Other (See Comments)    A lot of pressure in head  . Niacin Other (See Comments)    flushing  . Niacin And Related Other (See Comments)    Flushing and hot sensation  . Oxycodone Nausea Only  . Shellfish Allergy Nausea And Vomiting  . Vicodin [Hydrocodone-Acetaminophen] Other (See Comments)    Hypoglycemic episodes  . Heparin Rash    Medications reviewed.    ROS A multipoint review of systems was completed, all  pertinent positives and negatives are documented within the history of present illness and remainder are negative.   BP (!) 160/84   Pulse (!) 125   Temp 98.3 F (36.8 C) (Oral)   Wt 81.2 kg (179 lb)   BMI 32.74 kg/m   Physical Exam Gen.: No acute distress Chest: Clear to auscultation Heart: Tachycardic Abdomen: Soft, nontender, nondistended. Well approximated laparoscopic incision sites with surgical glue still in place. No evidence of erythema or drainage.    Results for orders placed or performed during the hospital encounter of 03/27/16 (from the past 48 hour(s))  Basic metabolic panel     Status: Abnormal   Collection Time: 03/27/16  6:19 PM  Result Value Ref Range   Sodium 139 135 - 145 mmol/L   Potassium 4.6 3.5 - 5.1 mmol/L   Chloride 105 101 - 111 mmol/L   CO2 23 22 - 32 mmol/L   Glucose, Bld 174 (H) 65 - 99 mg/dL   BUN 24 (H) 6 - 20 mg/dL   Creatinine, Ser 1.56 (H) 0.44 - 1.00 mg/dL   Calcium 9.2 8.9 - 10.3 mg/dL   GFR calc non Af Amer 34 (L) >60 mL/min   GFR calc Af Amer 39 (L) >60 mL/min    Comment: (NOTE) The eGFR has been calculated using the CKD EPI equation. This calculation has not been validated in all clinical situations. eGFR's persistently <60 mL/min signify possible Chronic Kidney Disease.    Anion gap 11 5 - 15  CBC     Status: None   Collection Time: 03/27/16  6:19 PM  Result Value Ref Range   WBC 10.2 3.6 - 11.0 K/uL   RBC 4.12 3.80 - 5.20 MIL/uL   Hemoglobin 12.6 12.0 - 16.0 g/dL   HCT 36.8 35.0 - 47.0 %   MCV 89.2 80.0 - 100.0 fL   MCH 30.7 26.0 - 34.0 pg   MCHC 34.4 32.0 - 36.0 g/dL   RDW 13.5 11.5 - 14.5 %   Platelets 334 150 - 440 K/uL  Urinalysis complete, with microscopic     Status: Abnormal   Collection Time: 03/27/16  6:19 PM  Result Value Ref Range   Color, Urine STRAW (A) YELLOW   APPearance CLEAR (A) CLEAR   Glucose, UA NEGATIVE NEGATIVE mg/dL   Bilirubin Urine NEGATIVE NEGATIVE   Ketones, ur NEGATIVE NEGATIVE mg/dL    Specific Gravity, Urine 1.006 1.005 - 1.030   Hgb urine dipstick NEGATIVE NEGATIVE   pH 7.0 5.0 - 8.0   Protein, ur NEGATIVE NEGATIVE mg/dL   Nitrite NEGATIVE NEGATIVE   Leukocytes, UA TRACE (A) NEGATIVE   RBC / HPF 0-5 0 - 5 RBC/hpf   WBC, UA 0-5 0 - 5 WBC/hpf   Bacteria, UA RARE (A) NONE SEEN   Squamous Epithelial / LPF 0-5 (A) NONE SEEN  Troponin I     Status: None   Collection Time: 03/27/16  6:19 PM  Result Value Ref Range   Troponin I <0.03 <0.03 ng/mL  TSH     Status: Abnormal   Collection Time: 03/27/16  6:19 PM  Result Value Ref Range   TSH 8.297 (H) 0.350 - 4.500 uIU/mL    Comment: Performed by a 3rd Generation assay with a functional sensitivity of <=0.01 uIU/mL.  T3, free     Status: None   Collection Time: 03/27/16  6:19 PM  Result Value Ref Range   T3, Free 2.9 2.0 - 4.4 pg/mL    Comment: (NOTE) Performed At: [facility] [address] [place], [place] [id number] Lindon Romp MD EB:[id number]   T4, free     Status: None   Collection Time: 03/27/16  6:19 PM  Result Value Ref Range   Free T4 0.96 0.61 - 1.12 ng/dL    Comment: (NOTE) Biotin ingestion may interfere with free T4 tests. If the results are inconsistent with the TSH level, previous test results, or the clinical presentation, then consider biotin interference. If needed, order repeat testing after stopping biotin.   Glucose, capillary     Status: Abnormal   Collection Time: 03/28/16 12:51 AM  Result Value Ref Range   Glucose-Capillary 308 (H) 65 - 99 mg/dL  Glucose, capillary     Status: Abnormal   Collection Time: 03/28/16  8:50 AM  Result Value Ref Range   Glucose-Capillary 262 (H) 65 - 99 mg/dL   No results found.  Assessment/Plan:  1. Aftercare following surgery 65 year old female status post laparoscopic paraesophageal hernia repair. She has been tolerating the full liquid diet and today we discussed advancement to a soft mechanical diet. Soft diet we'll start later this week  when she is a total of 3 weeks postop. Also discussed restarting her Lantus at a decreased dose and monitor her blood sugars for response. She has follow-up with her primary care provider scheduled in the coming weeks. All questions were answered to the patient and the patient's husband's satisfaction. Patient will follow-up in clinic in 2 weeks for additional check.    Clayburn Pert, MD FACS General Surgeon  03/29/2016,3:35 PM

## 2016-03-30 ENCOUNTER — Encounter: Payer: Self-pay | Admitting: General Surgery

## 2016-04-05 ENCOUNTER — Ambulatory Visit: Payer: Medicare Other | Admitting: Pulmonary Disease

## 2016-04-07 ENCOUNTER — Emergency Department: Payer: Medicare Other

## 2016-04-07 ENCOUNTER — Encounter: Payer: Self-pay | Admitting: Emergency Medicine

## 2016-04-07 ENCOUNTER — Observation Stay
Admission: EM | Admit: 2016-04-07 | Discharge: 2016-04-09 | Disposition: A | Discharge status: Home / Self Care | Payer: Medicare Other | Attending: Internal Medicine | Admitting: Internal Medicine

## 2016-04-07 DIAGNOSIS — R55 Syncope and collapse: Secondary | ICD-10-CM | POA: Diagnosis not present

## 2016-04-07 DIAGNOSIS — Z90710 Acquired absence of both cervix and uterus: Secondary | ICD-10-CM | POA: Diagnosis not present

## 2016-04-07 DIAGNOSIS — Z888 Allergy status to other drugs, medicaments and biological substances status: Secondary | ICD-10-CM | POA: Diagnosis not present

## 2016-04-07 DIAGNOSIS — I700 Atherosclerosis of aorta: Secondary | ICD-10-CM | POA: Diagnosis not present

## 2016-04-07 DIAGNOSIS — Z7982 Long term (current) use of aspirin: Secondary | ICD-10-CM | POA: Insufficient documentation

## 2016-04-07 DIAGNOSIS — Z9842 Cataract extraction status, left eye: Secondary | ICD-10-CM | POA: Insufficient documentation

## 2016-04-07 DIAGNOSIS — E1140 Type 2 diabetes mellitus with diabetic neuropathy, unspecified: Secondary | ICD-10-CM

## 2016-04-07 DIAGNOSIS — E860 Dehydration: Secondary | ICD-10-CM

## 2016-04-07 DIAGNOSIS — Z794 Long term (current) use of insulin: Secondary | ICD-10-CM | POA: Diagnosis not present

## 2016-04-07 DIAGNOSIS — D631 Anemia in chronic kidney disease: Secondary | ICD-10-CM | POA: Insufficient documentation

## 2016-04-07 DIAGNOSIS — Z8249 Family history of ischemic heart disease and other diseases of the circulatory system: Secondary | ICD-10-CM | POA: Insufficient documentation

## 2016-04-07 DIAGNOSIS — M1389 Other specified arthritis, multiple sites: Secondary | ICD-10-CM | POA: Insufficient documentation

## 2016-04-07 DIAGNOSIS — Z91013 Allergy to seafood: Secondary | ICD-10-CM | POA: Insufficient documentation

## 2016-04-07 DIAGNOSIS — K449 Diaphragmatic hernia without obstruction or gangrene: Secondary | ICD-10-CM | POA: Insufficient documentation

## 2016-04-07 DIAGNOSIS — K8020 Calculus of gallbladder without cholecystitis without obstruction: Secondary | ICD-10-CM | POA: Diagnosis not present

## 2016-04-07 DIAGNOSIS — R911 Solitary pulmonary nodule: Secondary | ICD-10-CM | POA: Diagnosis not present

## 2016-04-07 DIAGNOSIS — E785 Hyperlipidemia, unspecified: Secondary | ICD-10-CM | POA: Diagnosis present

## 2016-04-07 DIAGNOSIS — I129 Hypertensive chronic kidney disease with stage 1 through stage 4 chronic kidney disease, or unspecified chronic kidney disease: Secondary | ICD-10-CM | POA: Diagnosis not present

## 2016-04-07 DIAGNOSIS — Z885 Allergy status to narcotic agent status: Secondary | ICD-10-CM | POA: Insufficient documentation

## 2016-04-07 DIAGNOSIS — R519 Headache, unspecified: Secondary | ICD-10-CM

## 2016-04-07 DIAGNOSIS — R000 Tachycardia, unspecified: Secondary | ICD-10-CM

## 2016-04-07 DIAGNOSIS — N183 Chronic kidney disease, stage 3 unspecified: Secondary | ICD-10-CM | POA: Diagnosis present

## 2016-04-07 DIAGNOSIS — Z981 Arthrodesis status: Secondary | ICD-10-CM | POA: Insufficient documentation

## 2016-04-07 DIAGNOSIS — E1122 Type 2 diabetes mellitus with diabetic chronic kidney disease: Secondary | ICD-10-CM | POA: Diagnosis not present

## 2016-04-07 DIAGNOSIS — E1151 Type 2 diabetes mellitus with diabetic peripheral angiopathy without gangrene: Secondary | ICD-10-CM | POA: Insufficient documentation

## 2016-04-07 DIAGNOSIS — I2510 Atherosclerotic heart disease of native coronary artery without angina pectoris: Secondary | ICD-10-CM | POA: Diagnosis present

## 2016-04-07 DIAGNOSIS — Z9841 Cataract extraction status, right eye: Secondary | ICD-10-CM | POA: Insufficient documentation

## 2016-04-07 DIAGNOSIS — Z961 Presence of intraocular lens: Secondary | ICD-10-CM | POA: Diagnosis not present

## 2016-04-07 DIAGNOSIS — R0602 Shortness of breath: Secondary | ICD-10-CM

## 2016-04-07 DIAGNOSIS — R51 Headache: Secondary | ICD-10-CM

## 2016-04-07 DIAGNOSIS — Z955 Presence of coronary angioplasty implant and graft: Secondary | ICD-10-CM | POA: Diagnosis not present

## 2016-04-07 DIAGNOSIS — I10 Essential (primary) hypertension: Secondary | ICD-10-CM | POA: Diagnosis present

## 2016-04-07 LAB — URINALYSIS COMPLETE WITH MICROSCOPIC (ARMC ONLY)
BACTERIA UA: NONE SEEN
Bilirubin Urine: NEGATIVE
Glucose, UA: NEGATIVE mg/dL
HGB URINE DIPSTICK: NEGATIVE
Ketones, ur: NEGATIVE mg/dL
NITRITE: NEGATIVE
PH: 7 (ref 5.0–8.0)
PROTEIN: NEGATIVE mg/dL
SPECIFIC GRAVITY, URINE: 1.008 (ref 1.005–1.030)

## 2016-04-07 LAB — CBC
HEMATOCRIT: 34.3 % — AB (ref 35.0–47.0)
HEMOGLOBIN: 12.1 g/dL (ref 12.0–16.0)
MCH: 31.3 pg (ref 26.0–34.0)
MCHC: 35.3 g/dL (ref 32.0–36.0)
MCV: 88.7 fL (ref 80.0–100.0)
PLATELETS: 211 10*3/uL (ref 150–440)
RBC: 3.87 MIL/uL (ref 3.80–5.20)
RDW: 13.7 % (ref 11.5–14.5)
WBC: 8.2 10*3/uL (ref 3.6–11.0)

## 2016-04-07 LAB — TROPONIN I: Troponin I: <0.03 ng/mL (ref <0.03)

## 2016-04-07 LAB — BASIC METABOLIC PANEL
Anion gap: 10 (ref 5–15)
BUN: 28 mg/dL — ABNORMAL HIGH (ref 6–20)
CHLORIDE: 108 mmol/L (ref 101–111)
CO2: 23 mmol/L (ref 22–32)
CREATININE: 1.35 mg/dL — AB (ref 0.44–1.00)
Calcium: 9.9 mg/dL (ref 8.9–10.3)
GFR calc non Af Amer: 40 mL/min — ABNORMAL LOW (ref >60)
GFR, EST AFRICAN AMERICAN: 46 mL/min — AB (ref >60)
Glucose, Bld: 97 mg/dL (ref 65–99)
POTASSIUM: 4 mmol/L (ref 3.5–5.1)
Sodium: 141 mmol/L (ref 135–145)

## 2016-04-07 LAB — LIPASE, BLOOD: Lipase: 63 U/L — ABNORMAL HIGH (ref 11–51)

## 2016-04-07 MED ORDER — ONDANSETRON HCL 4 MG/2ML IJ SOLN
INTRAMUSCULAR | Status: AC
Start: 2016-04-07 — End: 2016-04-07
  Administered 2016-04-07: 4 mg via INTRAVENOUS
  Filled 2016-04-07: qty 2

## 2016-04-07 MED ORDER — SODIUM CHLORIDE 0.9 % IV BOLUS (SEPSIS)
1000.0000 mL | Freq: Once | INTRAVENOUS | Status: AC
  Administered 2016-04-07: 1000 mL via INTRAVENOUS

## 2016-04-07 MED ORDER — IOPAMIDOL (ISOVUE-370) INJECTION 76%
75.0000 mL | Freq: Once | INTRAVENOUS | Status: AC | PRN
  Administered 2016-04-07: 75 mL via INTRAVENOUS

## 2016-04-07 MED ORDER — MORPHINE SULFATE (PF) 4 MG/ML IV SOLN
INTRAVENOUS | Status: AC
  Administered 2016-04-07: 2 mg via INTRAVENOUS
  Filled 2016-04-07: qty 1

## 2016-04-07 MED ORDER — MORPHINE SULFATE (PF) 4 MG/ML IV SOLN
2.0000 mg | Freq: Once | INTRAVENOUS | Status: AC
  Administered 2016-04-07: 2 mg via INTRAVENOUS

## 2016-04-07 MED ORDER — ONDANSETRON HCL 4 MG/2ML IJ SOLN
4.0000 mg | Freq: Once | INTRAMUSCULAR | Status: AC
  Administered 2016-04-07: 4 mg via INTRAVENOUS

## 2016-04-07 NOTE — ED Provider Notes (Signed)
[facility] Emergency Department Provider Note   ____________________________________________    I have reviewed the triage vital signs and the nursing notes.   HISTORY  Chief Complaint Hypertension and Tachycardia     HPI Dorothy Rogers is a 65 y.o. female who presents with complaints of "I don't feel good". She reports she started feeling ill this morning, yesterday she felt well. She is unable to verbalize exactly how she feels ill but that she does not feel well. She denies pain. She denies dizziness. She denies fevers but does report a chill today. She reports recent hernia surgery which she has recovered from. Family reports similar episode one week ago but has been doing well since then   Past Medical History:  Diagnosis Date  . Anemia   . Anemia   . Arthritis    lower back, hips  . Blood transfusion without reported diagnosis   . CAD (coronary artery disease) FEB AND NOV 2009   6 STENTS  . 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 Cataract   . Chronic kidney disease    Stage III  . Coronary artery disease   . DDD (degenerative disc disease), lumbar   . Diabetes mellitus without complication ([place])    TYPE 2  . Herpes simplex   . Hyperlipidemia   . Hypertension    CONTROLLED ON MEDS  . Vertigo    occasional, no episodes in 2-3 months  . Vertigo    OCCASIONALLY  . Vitamin B 12 deficiency     Patient Active Problem List   Diagnosis Date Noted  . 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 Non-intractable cyclical vomiting with nausea   . Congenital esophageal defect   . Hiatal hernia   . Gastritis   . Diarrhea 07/10/2015  . Diabetes mellitus ([place]) 07/02/2015  . Adjustment disorder 06/16/2015  . Abdominal pain 06/16/2015  . C. difficile colitis 06/16/2015  . Intractable nausea and vomiting 06/09/2015  . Dehydration 06/09/2015  . Type 2 diabetes mellitus ([place]) 06/09/2015  . HLD (hyperlipidemia) 06/09/2015  . HTN  (hypertension) 06/09/2015  . CAD (coronary artery disease) 06/09/2015  . CKD (chronic kidney disease), stage III 06/09/2015  . Thrombocytopenia ([place]) 05/30/2015  . Lumbar canal stenosis 05/30/2015  . Disc disease with myelopathy, lumbar 05/30/2015  . Presence of stent in coronary artery 05/30/2015  . 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 Status post lumbar spinal fusion 12/25/2014  . DDD (degenerative disc disease), lumbar 11/18/2014  . Lumbar post-laminectomy syndrome 11/18/2014  . Facet syndrome, lumbar 11/18/2014  . Piriformis syndrome of left side 11/18/2014  . Status post lumbar spine operation 05/09/2014  . Adiposity 04/23/2014  . Thoracic and lumbosacral neuritis 03/27/2013  . H/O arthrodesis 03/27/2013  . Peripheral vascular disease ([place]) 03/22/2013  . Hypomagnesemia 03/22/2013  . Absolute anemia 03/22/2013    Past Surgical History:  Procedure Laterality Date  . [place] STUDY N/A 02/04/2016   Procedure: [place] STUDY;  Surgeon: Lucilla Lame, MD;  Location: [facility] ENDOSCOPY;  Service: Endoscopy;  Laterality: N/A;  . ABDOMINAL HYSTERECTOMY    . BACK SURGERY     Lumbar spinal fusion 05/30/2015  . CARDIAC CATHETERIZATION  2009  . CATARACT EXTRACTION Left   . CATARACT EXTRACTION W/PHACO Right 03/19/2015   Procedure: CATARACT EXTRACTION PHACO AND INTRAOCULAR LENS PLACEMENT ([place]);  Surgeon: Leandrew Koyanagi, MD;  Location: [place];  Service: Ophthalmology;  Laterality: Right;  DIABETIC - insulin and oral  meds  . CORONARY STENT PLACEMENT  03/2008  . CYSTECTOMY Right    wrist  . ESOPHAGEAL MANOMETRY N/A 02/04/2016   Procedure: ESOPHAGEAL MANOMETRY (EM);  Surgeon: Lucilla Lame, MD;  Location: [facility] ENDOSCOPY;  Service: Endoscopy;  Laterality: N/A;  . ESOPHAGOGASTRODUODENOSCOPY (EGD) WITH PROPOFOL N/A 09/04/2015   Procedure: ESOPHAGOGASTRODUODENOSCOPY (EGD) WITH PROPOFOL;  Surgeon: Lucilla Lame, MD;  Location: [place];  Service: Endoscopy;  Laterality:  N/A;  INSULIN DEPENDENT DIABETIC  . EYE SURGERY Left    cataract extraction  . LUMBAR FUSION  2001, 2014, 2015  . [place]     2001, 2014, 2015  . TUMOR EXCISION     back of neck    Prior to Admission medications   Medication Sig Start Date End Date Taking? Authorizing Provider  glipiZIDE (GLUCOTROL) 10 MG tablet Take 10 mg by mouth 2 (two) times daily.   Yes Historical Provider, MD  acyclovir (ZOVIRAX) 400 MG tablet Take 400 mg by mouth as needed.  08/07/15   Historical Provider, MD  amLODipine (NORVASC) 5 MG tablet Take 5 mg by mouth every morning. AM 08/29/14   Historical Provider, MD  aspirin EC 81 MG tablet Take 81 mg by mouth at bedtime.    Historical Provider, MD  atorvastatin (LIPITOR) 40 MG tablet Take 40 mg by mouth at bedtime.  08/16/14   Historical Provider, MD  clopidogrel (PLAVIX) 75 MG tablet Take 1 tablet (75 mg total) by mouth daily. 07/11/15   Bettey Costa, MD  insulin lispro (HUMALOG) 100 UNIT/ML injection Inject 16 Units into the skin 3 (three) times daily with meals.     Historical Provider, MD  Insulin Pen Needle (ULTICARE MICRO PEN NEEDLES) 32G X 4 MM MISC Use as directed. 08/20/15   Historical Provider, MD  levothyroxine (SYNTHROID, LEVOTHROID) 25 MCG tablet Take 1 tablet (25 mcg total) by mouth daily before breakfast. 03/28/16   Nance Pear, MD  losartan (COZAAR) 100 MG tablet Take 100 mg by mouth every morning. AM 02/05/14   Historical Provider, MD  metoCLOPramide (REGLAN) 10 MG tablet Take 1 tablet (10 mg total) by mouth every 8 (eight) hours as needed for nausea. 03/28/16 03/28/17  Nance Pear, MD  ondansetron (ZOFRAN ODT) 8 MG disintegrating tablet Take 8 mg by mouth as needed for nausea or vomiting.    Historical Provider, MD  oxyCODONE (ROXICODONE) 5 MG/5ML solution Take 5 mLs (5 mg total) by mouth every 4 (four) hours as needed for moderate pain or severe pain. 03/14/16   Clayburn Pert, MD     Allergies Tramadol; Beta adrenergic blockers; Celebrex  [celecoxib]; Niacin; Niacin and related; Oxycodone; Shellfish allergy; Vicodin [hydrocodone-acetaminophen]; and Heparin  Family History  Problem Relation Age of Onset  . Heart disease Father   . Diabetes Other   . Breast cancer Neg Hx     Social History Social History  Substance Use Topics  . Smoking status: Never Smoker  . Smokeless tobacco: Never Used  . Alcohol use 0.0 oz/week     Comment: 2-3 drinks per year    Review of Systems  Constitutional: No fever Eyes: No visual changes.    Cardiovascular: Denies chest pain. Respiratory: Denies shortness of breath. Gastrointestinal: No abdominal pain.  No nausea, no vomiting.   Genitourinary: Negative for dysuria. Musculoskeletal: Negative for back pain. Skin: Negative for rash. Neurological: Negative for headaches or weakness  10-point ROS otherwise negative.  ____________________________________________   PHYSICAL EXAM:  VITAL SIGNS: ED Triage Vitals [04/07/16 2103]  Enc  Vitals Group     BP (!) 166/93     Pulse Rate (!) 142     Resp (!) 22     Temp 98.2 F (36.8 C)     Temp Source Oral     SpO2 99 %     Weight 179 lb (81.2 kg)     Height 5\' 2"  (1.575 m)     Head Circumference      Peak Flow      Pain Score 2     Pain Loc      Pain Edu?      Excl. in [place]?     Constitutional: Alert and oriented. No acute distress.  Eyes: Conjunctivae are normal.   Nose: No congestion/rhinnorhea. Mouth/Throat: Mucous membranes are moist.   Neck:  Painless ROM Cardiovascular: Tachycardia regular rhythm. Grossly normal heart sounds.  Good peripheral circulation. Respiratory: Normal respiratory effort.  No retractions. Lungs CTAB. Gastrointestinal: Soft and nontender. No distention.  No CVA tenderness. Genitourinary: deferred Musculoskeletal: No lower extremity tenderness nor edema.  Warm and well perfused Neurologic:  Normal speech and language. No gross focal neurologic deficits are appreciated.  Skin:  Skin is warm, dry  and intact. No rash noted. Psychiatric: Mood and affect are normal. Speech and behavior are normal.  ____________________________________________   LABS (all labs ordered are listed, but only abnormal results are displayed)  Labs Reviewed  BASIC METABOLIC PANEL - Abnormal; Notable for the following:       Result Value   BUN 28 (*)    Creatinine, Ser 1.35 (*)    GFR calc non Af Amer 40 (*)    GFR calc Af Amer 46 (*)    All other components within normal limits  CBC - Abnormal; Notable for the following:    HCT 34.3 (*)    All other components within normal limits  LIPASE, BLOOD - Abnormal; Notable for the following:    Lipase 63 (*)    All other components within normal limits  TROPONIN I  URINALYSIS COMPLETEWITH MICROSCOPIC ([facility] ONLY)   ____________________________________________  EKG  ED ECG REPORT I, Lavonia Drafts, the attending physician, personally viewed and interpreted this ECG.  Date: 04/07/2016 EKG Time: 9:05 PM Rate: 138 Rhythm: Sinus tachycardia QRS Axis: normal Intervals: normal ST/T Wave abnormalities: normal Conduction Disturbances: none Narrative Interpretation: unremarkable  ____________________________________________  RADIOLOGY  Chest x-ray unremarkable CT angiography of the chest is unremarkable ____________________________________________   PROCEDURES  Procedure(s) performed: No    Critical Care performed: No ____________________________________________   INITIAL IMPRESSION / ASSESSMENT AND PLAN / ED COURSE  Pertinent labs & imaging results that were available during my care of the patient were reviewed by me and considered in my medical decision making (see chart for details).  Patient presents with "not feeling well", she is significantly tachycardic. We'll give IV fluids, check labs and reevaluate. Review of medical records demonstrates similar presentation approximately 10 days ago  Clinical Course   Given the unexplained  tachycardia I obtained CT angiography of the chest but it is negative for PE. Patient continues to feel ill and continues to be tachycardic even after fluids. I'll discuss with hospitalist for admission and further evaluation ____________________________________________   FINAL CLINICAL IMPRESSION(S) / ED DIAGNOSES  Dehydration Tachycardia Near syncope   NEW MEDICATIONS STARTED DURING THIS VISIT:  New Prescriptions   No medications on file     Note:  This document was prepared using Dragon voice recognition software and may include unintentional dictation  errors.    Lavonia Drafts, MD 04/07/16 [phone]

## 2016-04-07 NOTE — ED Triage Notes (Signed)
Patient comes into the ED via POV c/o hypertension and tachycardia that started today.  Patient had hiatal hernia surgery 4 weeks ago.  Patient started feeling "poorly" today and the family was able to check her heart rate and blood pressure.  Patient denies any dizziness, chest pain, or shortness of breath.  Patient c/o of headache.

## 2016-04-07 NOTE — ED Notes (Signed)
Patient transported to CT 

## 2016-04-07 NOTE — H&P (Signed)
[facility] NAME: Dorothy Rogers    MR#:  [id number]  DATE OF BIRTH:  07/29/1950  DATE OF ADMISSION:  04/07/2016  PRIMARY CARE PHYSICIAN: Idelle Crouch, MD   REQUESTING/REFERRING PHYSICIAN: Corky Downs, MD  CHIEF COMPLAINT:   Chief Complaint  Patient presents with  . Hypertension  . Tachycardia    HISTORY OF PRESENT ILLNESS:  Dorothy Rogers  is a 65 y.o. female who presents with Malaise, near syncope, tachycardia and hypertension. Patient recently had hiatal hernia repair here. Since that time had been recovering well, until today when she presented with these symptoms. Has significant cardiac history with multiple stents placed in the past. Her EKG does not appear to have ischemic changes, and her initial troponin was negative in the ED. However, she did have significant tachycardia to the 150s, and blood pressure as high as [id number] systolic.  PAST MEDICAL HISTORY:   Past Medical History:  Diagnosis Date  . Anemia   . Anemia   . Arthritis    lower back, hips  . Blood transfusion without reported diagnosis   . CAD (coronary artery disease) FEB AND NOV 2009   6 STENTS  . 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 Cataract   . Chronic kidney disease    Stage III  . Coronary artery disease   . DDD (degenerative disc disease), lumbar   . Diabetes mellitus without complication ([place])    TYPE 2  . Herpes simplex   . Hyperlipidemia   . Hypertension    CONTROLLED ON MEDS  . Vertigo    occasional, no episodes in 2-3 months  . Vertigo    OCCASIONALLY  . Vitamin B 12 deficiency     PAST SURGICAL HISTORY:   Past Surgical History:  Procedure Laterality Date  . [place] STUDY N/A 02/04/2016   Procedure: [place] STUDY;  Surgeon: Lucilla Lame, MD;  Location: [facility] ENDOSCOPY;  Service: Endoscopy;  Laterality: N/A;  . ABDOMINAL HYSTERECTOMY    . BACK SURGERY     Lumbar spinal fusion 05/30/2015  . CARDIAC  CATHETERIZATION  2009  . CATARACT EXTRACTION Left   . CATARACT EXTRACTION W/PHACO Right 03/19/2015   Procedure: CATARACT EXTRACTION PHACO AND INTRAOCULAR LENS PLACEMENT ([place]);  Surgeon: Leandrew Koyanagi, MD;  Location: [place];  Service: Ophthalmology;  Laterality: Right;  DIABETIC - insulin and oral meds  . CORONARY STENT PLACEMENT  03/2008  . CYSTECTOMY Right    wrist  . ESOPHAGEAL MANOMETRY N/A 02/04/2016   Procedure: ESOPHAGEAL MANOMETRY (EM);  Surgeon: Lucilla Lame, MD;  Location: [facility] ENDOSCOPY;  Service: Endoscopy;  Laterality: N/A;  . ESOPHAGOGASTRODUODENOSCOPY (EGD) WITH PROPOFOL N/A 09/04/2015   Procedure: ESOPHAGOGASTRODUODENOSCOPY (EGD) WITH PROPOFOL;  Surgeon: Lucilla Lame, MD;  Location: [place];  Service: Endoscopy;  Laterality: N/A;  INSULIN DEPENDENT DIABETIC  . EYE SURGERY Left    cataract extraction  . LUMBAR FUSION  2001, 2014, 2015  . [place]     2001, 2014, 2015  . TUMOR EXCISION     back of neck    SOCIAL HISTORY:   Social History  Substance Use Topics  . Smoking status: Never Smoker  . Smokeless tobacco: Never Used  . Alcohol use 0.0 oz/week     Comment: 2-3 drinks per year    FAMILY HISTORY:   Family History  Problem Relation Age of Onset  . Heart disease Father   . Diabetes  Other   . Breast cancer Neg Hx     DRUG ALLERGIES:   Allergies  Allergen Reactions  . Tramadol Swelling  . Beta Adrenergic Blockers Hives  . Celebrex [Celecoxib] Other (See Comments)    A lot of pressure in head  . Niacin Other (See Comments)    flushing  . Niacin And Related Other (See Comments)    Flushing and hot sensation  . Oxycodone Nausea Only  . Shellfish Allergy Nausea And Vomiting  . Vicodin [Hydrocodone-Acetaminophen] Other (See Comments)    Hypoglycemic episodes  . Heparin Rash    MEDICATIONS AT HOME:   Prior to Admission medications   Medication Sig Start Date End Date Taking? Authorizing Provider  amLODipine (NORVASC) 5  MG tablet Take 5 mg by mouth every morning. AM 08/29/14  Yes Historical Provider, MD  aspirin EC 81 MG tablet Take 81 mg by mouth at bedtime.   Yes Historical Provider, MD  atorvastatin (LIPITOR) 40 MG tablet Take 40 mg by mouth at bedtime.  08/16/14  Yes Historical Provider, MD  calcium-vitamin D (OSCAL WITH D) 500-200 MG-UNIT tablet Take 1 tablet by mouth daily with breakfast.   Yes Historical Provider, MD  clopidogrel (PLAVIX) 75 MG tablet Take 1 tablet (75 mg total) by mouth daily. 07/11/15  Yes Sital Mody, MD  glipiZIDE (GLUCOTROL) 10 MG tablet Take 10 mg by mouth 2 (two) times daily.   Yes Historical Provider, MD  insulin glargine (LANTUS) 100 UNIT/ML injection Inject 36 Units into the skin every morning.   Yes Historical Provider, MD  losartan (COZAAR) 100 MG tablet Take 100 mg by mouth every morning. AM 02/05/14  Yes Historical Provider, MD  metoCLOPramide (REGLAN) 10 MG tablet Take 1 tablet (10 mg total) by mouth every 8 (eight) hours as needed for nausea. 03/28/16 03/28/17 Yes Nance Pear, MD  ondansetron (ZOFRAN ODT) 8 MG disintegrating tablet Take 8 mg by mouth as needed for nausea or vomiting.   Yes Historical Provider, MD  oxyCODONE (ROXICODONE) 5 MG/5ML solution Take 5 mLs (5 mg total) by mouth every 4 (four) hours as needed for moderate pain or severe pain. 03/14/16  Yes Clayburn Pert, MD  vitamin B-12 (CYANOCOBALAMIN) 1000 MCG tablet Take 1,000 mcg by mouth daily.   Yes Historical Provider, MD  acyclovir (ZOVIRAX) 400 MG tablet Take 400 mg by mouth as needed.  08/07/15   Historical Provider, MD  levothyroxine (SYNTHROID, LEVOTHROID) 25 MCG tablet Take 1 tablet (25 mcg total) by mouth daily before breakfast. Patient not taking: Reported on 04/07/2016 03/28/16   Nance Pear, MD    REVIEW OF SYSTEMS:  Review of Systems  Constitutional: Positive for malaise/fatigue. Negative for chills, fever and weight loss.  HENT: Negative for ear pain, hearing loss and tinnitus.   Eyes:  Negative for blurred vision, double vision, pain and redness.  Respiratory: Negative for cough, hemoptysis and shortness of breath.   Cardiovascular: Negative for chest pain, palpitations, orthopnea and leg swelling.  Gastrointestinal: Negative for abdominal pain, constipation, diarrhea, nausea and vomiting.  Genitourinary: Negative for dysuria, frequency and hematuria.  Musculoskeletal: Negative for back pain, joint pain and neck pain.  Skin:       No acne, rash, or lesions  Neurological: Positive for headaches. Negative for dizziness, tremors, focal weakness and weakness.  Endo/Heme/Allergies: Negative for polydipsia. Does not bruise/bleed easily.  Psychiatric/Behavioral: Negative for depression. The patient is not nervous/anxious and does not have insomnia.      VITAL SIGNS:   Vitals:  04/07/16 2103 04/07/16 2130 04/07/16 2200 04/07/16 2325  BP: (!) 166/93 (!) 168/83 (!) 162/82 (!) 150/70  Pulse: (!) 142 (!) 119 (!) 116 95  Resp: (!) 22 16 (!) 22 10  Temp: 98.2 F (36.8 C)     TempSrc: Oral     SpO2: 99% 100% 100% 99%  Weight: 81.2 kg (179 lb)     Height: 5\' 2"  (1.575 m)      Wt Readings from Last 3 Encounters:  04/07/16 81.2 kg (179 lb)  03/29/16 81.2 kg (179 lb)  03/27/16 79.8 kg (176 lb)    PHYSICAL EXAMINATION:  Physical Exam  Vitals reviewed. Constitutional: She is oriented to person, place, and time. She appears well-developed and well-nourished. She appears distressed (uncomfortable).  HENT:  Head: Normocephalic and atraumatic.  Mouth/Throat: Oropharynx is clear and moist.  Eyes: Conjunctivae and EOM are normal. Pupils are equal, round, and reactive to light. No scleral icterus.  Neck: Normal range of motion. Neck supple. No JVD present. No thyromegaly present.  Cardiovascular: Normal rate, regular rhythm and intact distal pulses.  Exam reveals no gallop and no friction rub.   No murmur heard. Respiratory: Effort normal and breath sounds normal. No respiratory  distress. She has no wheezes. She has no rales.  GI: Soft. Bowel sounds are normal. She exhibits no distension. There is no tenderness.  Musculoskeletal: Normal range of motion. She exhibits no edema.  No arthritis, no gout  Lymphadenopathy:    She has no cervical adenopathy.  Neurological: She is alert and oriented to person, place, and time. No cranial nerve deficit.  No dysarthria, no aphasia  Skin: Skin is warm and dry. No rash noted. No erythema.  Psychiatric: She has a normal mood and affect. Her behavior is normal. Judgment and thought content normal.    LABORATORY PANEL:   CBC  Recent Labs Lab 04/07/16 2115  WBC 8.2  HGB 12.1  HCT 34.3*  PLT 211   ------------------------------------------------------------------------------------------------------------------  Chemistries   Recent Labs Lab 04/07/16 2115  NA 141  K 4.0  CL 108  CO2 23  GLUCOSE 97  BUN 28*  CREATININE 1.35*  CALCIUM 9.9   ------------------------------------------------------------------------------------------------------------------  Cardiac Enzymes  Recent Labs Lab 04/07/16 2115  TROPONINI <0.03   ------------------------------------------------------------------------------------------------------------------  RADIOLOGY:  Ct Angio Chest Pe W And/or Wo Contrast  Result Date: 04/07/2016 CLINICAL DATA:  Tachycardia and hypertension. EXAM: CT ANGIOGRAPHY CHEST WITH CONTRAST TECHNIQUE: Multidetector CT imaging of the chest was performed using the standard protocol during bolus administration of intravenous contrast. Multiplanar CT image reconstructions and MIPs were obtained to evaluate the vascular anatomy. CONTRAST:  55 mL Isovue 370 IV COMPARISON:  CTA chest 12/01/2015 FINDINGS: Cardiovascular: There is satisfactory opacification of the pulmonary arteries to the segmental level. There is no pulmonary embolus. The main pulmonary artery is within normal limits for size, measuring 2.3 cm at  the bifurcation. There is no CT evidence of acute right heart strain. Mild aortic atherosclerosis. Coronary artery stents are noted. There is a normal 3-vessel arch branching pattern. Heart size is normal, without pericardial effusion. Mediastinum/Nodes: No mediastinal, hilar or axillary lymphadenopathy. The visualized thyroid is unremarkable. There is a small hiatal hernia. Lungs/Pleura: Unchanged right middle lobe pulmonary nodule measuring 5 mm. No pleural effusion or pneumothorax. No focal consolidation. Upper Abdomen: Contrast bolus timing is not optimized for evaluation of the abdominal organs. Multiple large gallstones are again seen. There are multiple splenic granulomata. Musculoskeletal: No lytic or blastic osseous lesions. No bony spinal  canal stenosis. Review of the MIP images confirms the above findings. IMPRESSION: 1. No pulmonary embolus. 2. Coronary artery stents and aortic atherosclerosis. 3. Unchanged right middle lobe 5 mm pulmonary nodule. Recommend continued annual surveillance until stability of 5 years can be documented. 4. Cholelithiasis without evidence of acute cholecystitis. Electronically Signed   By: Ulyses Jarred M.D.   On: 04/07/2016 23:12   Dg Chest Port 1 View  Result Date: 04/07/2016 CLINICAL DATA:  Hypertension and tachycardia. Hiatal hernia surgery 4 weeks ago. EXAM: PORTABLE CHEST 1 VIEW COMPARISON:  Exams dating back through 06/07/2015 FINDINGS: Heart is top-normal in size. There is aortic atherosclerosis. The hiatal hernia is no longer apparent. There is atelectasis at the right lung base medially. No overt pulmonary edema, effusion or pneumothorax. Soft tissue calcification adjacent the humeral head on the right is again seen which may reflect calcific bursitis possibly rotator cuff tendinopathy, favor bursitis. IMPRESSION: Right basilar atelectasis. Stable soft tissue calcification adjacent to the right humeral head consistent with calcific bursitis or calcific rotator  cuff tendinopathy Electronically Signed   By: Ashley Royalty M.D.   On: 04/07/2016 21:38    EKG:   Orders placed or performed during the hospital encounter of 04/07/16  . ED EKG within 10 minutes  . ED EKG within 10 minutes  . EKG 12-Lead  . EKG 12-Lead    IMPRESSION AND PLAN:  Principal Problem:   Near syncope - unclear etiology, though perhaps related to her significantly elevated blood pressure. Patient denies any chest pain or other typical cardiac symptoms. EKG does not show any evidence for ischemia. We'll trend her cardiac enzymes tonight and get an echocardiogram in the morning. We'll also get a surgical consult just to see if her symptoms may have anything to do with her recent procedure. Active Problems:   Type 2 diabetes mellitus (HCC) - sliding scale insulin with corresponding glucose checks   HTN (hypertension) - continue home meds, additional when necessary antihypertensives for blood pressure goal less than 160/100   CAD (coronary artery disease) - trend troponins as above, otherwise continue home meds   HLD (hyperlipidemia) - integument home meds   CKD (chronic kidney disease), stage III - avoid nephrotoxins, monitor, currently at baseline  All the records are reviewed and case discussed with ED provider. Management plans discussed with the patient and/or family.  DVT PROPHYLAXIS: SubQ lovenox  GI PROPHYLAXIS: None  ADMISSION STATUS: Observation  CODE STATUS: Full Code Status History    Date Active Date Inactive Code Status Order ID Comments User Context   03/11/2016  2:09 PM 03/14/2016  4:10 PM Full Code [id number]  Clayburn Pert, MD Inpatient   07/10/2015 10:47 AM 07/11/2015  6:33 PM Full Code [id number]  Bettey Costa, MD Inpatient   06/09/2015 10:56 PM 06/10/2015 10:12 PM Full Code [id number]  Lance Coon, MD Inpatient    Advance Directive Documentation   Flowsheet Row Most Recent Value  Type of Advance Directive  Healthcare Power of Attorney, Living will   Pre-existing out of facility DNR order (yellow form or pink MOST form)  No data  "MOST" Form in Place?  No data      TOTAL TIME TAKING CARE OF THIS PATIENT: 40 minutes.    Tonianne Fine FIELDING 04/07/2016, 11:35 PM  [facility]  Office  [phone]  CC: Primary care physician; Idelle Crouch, MD

## 2016-04-08 ENCOUNTER — Observation Stay
Admit: 2016-04-08 | Discharge: 2016-04-08 | Disposition: A | Discharge status: Home / Self Care | Payer: Medicare Other | Attending: Internal Medicine | Admitting: Internal Medicine

## 2016-04-08 ENCOUNTER — Observation Stay: Payer: Medicare Other

## 2016-04-08 DIAGNOSIS — R55 Syncope and collapse: Secondary | ICD-10-CM | POA: Diagnosis not present

## 2016-04-08 DIAGNOSIS — N183 Chronic kidney disease, stage 3 (moderate): Secondary | ICD-10-CM | POA: Diagnosis not present

## 2016-04-08 DIAGNOSIS — R0602 Shortness of breath: Secondary | ICD-10-CM

## 2016-04-08 DIAGNOSIS — E860 Dehydration: Secondary | ICD-10-CM | POA: Diagnosis not present

## 2016-04-08 LAB — BASIC METABOLIC PANEL
ANION GAP: 8 (ref 5–15)
BUN: 27 mg/dL — ABNORMAL HIGH (ref 6–20)
CO2: 25 mmol/L (ref 22–32)
Calcium: 9.1 mg/dL (ref 8.9–10.3)
Chloride: 109 mmol/L (ref 101–111)
Creatinine, Ser: 1.47 mg/dL — ABNORMAL HIGH (ref 0.44–1.00)
GFR, EST AFRICAN AMERICAN: 42 mL/min — AB (ref >60)
GFR, EST NON AFRICAN AMERICAN: 36 mL/min — AB (ref >60)
GLUCOSE: 89 mg/dL (ref 65–99)
POTASSIUM: 4.1 mmol/L (ref 3.5–5.1)
SODIUM: 142 mmol/L (ref 135–145)

## 2016-04-08 LAB — CBC
HCT: 31.7 % — ABNORMAL LOW (ref 35.0–47.0)
HEMOGLOBIN: 10.9 g/dL — AB (ref 12.0–16.0)
MCH: 31.1 pg (ref 26.0–34.0)
MCHC: 34.4 g/dL (ref 32.0–36.0)
MCV: 90.4 fL (ref 80.0–100.0)
Platelets: 189 10*3/uL (ref 150–440)
RBC: 3.5 MIL/uL — ABNORMAL LOW (ref 3.80–5.20)
RDW: 14 % (ref 11.5–14.5)
WBC: 7.1 10*3/uL (ref 3.6–11.0)

## 2016-04-08 LAB — GLUCOSE, CAPILLARY
GLUCOSE-CAPILLARY: 64 mg/dL — AB (ref 65–99)
GLUCOSE-CAPILLARY: 238 mg/dL — AB (ref 65–99)
GLUCOSE-CAPILLARY: 181 mg/dL — AB (ref 65–99)
GLUCOSE-CAPILLARY: 157 mg/dL — AB (ref 65–99)
GLUCOSE-CAPILLARY: 138 mg/dL — AB (ref 65–99)
GLUCOSE-CAPILLARY: 204 mg/dL — AB (ref 65–99)
Glucose-Capillary: 62 mg/dL — ABNORMAL LOW (ref 65–99)
Glucose-Capillary: 67 mg/dL (ref 65–99)
Glucose-Capillary: 232 mg/dL — ABNORMAL HIGH (ref 65–99)
Glucose-Capillary: 228 mg/dL — ABNORMAL HIGH (ref 65–99)
Glucose-Capillary: 154 mg/dL — ABNORMAL HIGH (ref 65–99)

## 2016-04-08 LAB — TROPONIN I: Troponin I: <0.03 ng/mL (ref <0.03)

## 2016-04-08 MED ORDER — DEXTROSE-NACL 5-0.45 % IV SOLN
INTRAVENOUS | Status: DC
  Administered 2016-04-08: 03:00:00 via INTRAVENOUS

## 2016-04-08 MED ORDER — OXYCODONE HCL 5 MG/5ML PO SOLN: 5.0000 mg | ORAL | Status: DC | PRN

## 2016-04-08 MED ORDER — ACETAMINOPHEN 325 MG PO TABS
650.0000 mg | ORAL_TABLET | Freq: Once | ORAL | Status: AC
  Administered 2016-04-08: 650 mg via ORAL
  Filled 2016-04-08: qty 2

## 2016-04-08 MED ORDER — DEXTROSE 50 % IV SOLN
1.0000 | Freq: Once | INTRAVENOUS | Status: AC
  Administered 2016-04-08: 50 mL via INTRAVENOUS
  Filled 2016-04-08: qty 50

## 2016-04-08 MED ORDER — METOCLOPRAMIDE HCL 10 MG PO TABS
10.0000 mg | ORAL_TABLET | Freq: Three times a day (TID) | ORAL | Status: DC | PRN
  Administered 2016-04-08 – 2016-04-09 (×3): 10 mg via ORAL
  Filled 2016-04-08 (×3): qty 1

## 2016-04-08 MED ORDER — ENOXAPARIN SODIUM 40 MG/0.4ML ~~LOC~~ SOLN
40.0000 mg | Freq: Every day | SUBCUTANEOUS | Status: DC
  Administered 2016-04-08 (×2): 40 mg via SUBCUTANEOUS
  Filled 2016-04-08 (×2): qty 0.4

## 2016-04-08 MED ORDER — AMLODIPINE BESYLATE 5 MG PO TABS
5.0000 mg | ORAL_TABLET | Freq: Every day | ORAL | Status: DC
  Administered 2016-04-08 – 2016-04-09 (×2): 5 mg via ORAL
  Filled 2016-04-08 (×2): qty 1

## 2016-04-08 MED ORDER — ACETAMINOPHEN 650 MG RE SUPP: 650.0000 mg | Freq: Four times a day (QID) | RECTAL | Status: DC | PRN

## 2016-04-08 MED ORDER — ATORVASTATIN CALCIUM 20 MG PO TABS
40.0000 mg | ORAL_TABLET | Freq: Every day | ORAL | Status: DC
  Administered 2016-04-08 (×2): 40 mg via ORAL
  Filled 2016-04-08 (×2): qty 2

## 2016-04-08 MED ORDER — SODIUM CHLORIDE 0.9 % IV SOLN
INTRAVENOUS | Status: DC
  Administered 2016-04-08: 01:00:00 via INTRAVENOUS

## 2016-04-08 MED ORDER — INSULIN ASPART 100 UNIT/ML ~~LOC~~ SOLN: 0.0000 [IU] | Freq: Every day | SUBCUTANEOUS | Status: DC

## 2016-04-08 MED ORDER — ACETAMINOPHEN 325 MG PO TABS
650.0000 mg | ORAL_TABLET | Freq: Four times a day (QID) | ORAL | Status: DC | PRN
  Administered 2016-04-08 (×3): 650 mg via ORAL
  Filled 2016-04-08 (×3): qty 2

## 2016-04-08 MED ORDER — CLOPIDOGREL BISULFATE 75 MG PO TABS
75.0000 mg | ORAL_TABLET | Freq: Every day | ORAL | Status: DC
  Administered 2016-04-08 – 2016-04-09 (×2): 75 mg via ORAL
  Filled 2016-04-08 (×2): qty 1

## 2016-04-08 MED ORDER — LOSARTAN POTASSIUM 50 MG PO TABS
100.0000 mg | ORAL_TABLET | ORAL | Status: DC
  Administered 2016-04-08 – 2016-04-09 (×2): 100 mg via ORAL
  Filled 2016-04-08 (×2): qty 2

## 2016-04-08 MED ORDER — ONDANSETRON HCL 4 MG/2ML IJ SOLN
4.0000 mg | Freq: Four times a day (QID) | INTRAMUSCULAR | Status: DC | PRN
  Administered 2016-04-08 – 2016-04-09 (×2): 4 mg via INTRAVENOUS
  Filled 2016-04-08 (×2): qty 2

## 2016-04-08 MED ORDER — ASPIRIN EC 81 MG PO TBEC
81.0000 mg | DELAYED_RELEASE_TABLET | Freq: Every day | ORAL | Status: DC
  Administered 2016-04-08 (×2): 81 mg via ORAL
  Filled 2016-04-08 (×2): qty 1

## 2016-04-08 MED ORDER — INSULIN ASPART 100 UNIT/ML ~~LOC~~ SOLN
0.0000 [IU] | Freq: Three times a day (TID) | SUBCUTANEOUS | Status: DC
  Filled 2016-04-08: qty 2

## 2016-04-08 MED ORDER — ONDANSETRON HCL 4 MG PO TABS: 4.0000 mg | ORAL_TABLET | Freq: Four times a day (QID) | ORAL | Status: DC | PRN

## 2016-04-08 NOTE — H&P (Signed)
Dorothy Rogers is an 65 y.o. female.   Chief Complaint: weakness, headaches HPI: 65 yr old female who is 4 weeks out from Lap nissen repair, who came in with weakness, malaise, headache and high blood pressure.  She states overall feeling poorly but at home was tolerating a soft diet well and doing ok with that.  She denies any abdominal pain, SOB or any chest discomfort or heartburn.  She currently has a bad headache associated with nausea but before that started was eating well.    Past Medical History:  Diagnosis Date  . Anemia   . Anemia   . Arthritis    lower back, hips  . Blood transfusion without reported diagnosis   . CAD (coronary artery disease) FEB AND NOV 2009   6 STENTS  . 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 Cataract   . Chronic kidney disease    Stage III  . Coronary artery disease   . DDD (degenerative disc disease), lumbar   . Diabetes mellitus without complication ([place])    TYPE 2  . Herpes simplex   . Hyperlipidemia   . Hypertension    CONTROLLED ON MEDS  . Vertigo    occasional, no episodes in 2-3 months  . Vertigo    OCCASIONALLY  . Vitamin B 12 deficiency     Past Surgical History:  Procedure Laterality Date  . [place] STUDY N/A 02/04/2016   Procedure: [place] STUDY;  Surgeon: Lucilla Lame, MD;  Location: [facility] ENDOSCOPY;  Service: Endoscopy;  Laterality: N/A;  . ABDOMINAL HYSTERECTOMY    . BACK SURGERY     Lumbar spinal fusion 05/30/2015  . CARDIAC CATHETERIZATION  2009  . CATARACT EXTRACTION Left   . CATARACT EXTRACTION W/PHACO Right 03/19/2015   Procedure: CATARACT EXTRACTION PHACO AND INTRAOCULAR LENS PLACEMENT (Normal);  Surgeon: Leandrew Koyanagi, MD;  Location: [place];  Service: Ophthalmology;  Laterality: Right;  DIABETIC - insulin and oral meds  . CORONARY STENT PLACEMENT  03/2008  . CYSTECTOMY Right    wrist  . ESOPHAGEAL MANOMETRY N/A 02/04/2016   Procedure: ESOPHAGEAL MANOMETRY (EM);  Surgeon: Lucilla Lame, MD;  Location: [facility]COPY;  Service: Endoscopy;  Laterality: N/A;  . ESOPHAGOGASTRODUODENOSCOPY (EGD) WITH PROPOFOL N/A 09/04/2015   Procedure: ESOPHAGOGASTRODUODENOSCOPY (EGD) WITH PROPOFOL;  Surgeon: Lucilla Lame, MD;  Location: [place];  Service: Endoscopy;  Laterality: N/A;  INSULIN DEPENDENT DIABETIC  . EYE SURGERY Left    cataract extraction  . LUMBAR FUSION  2001, 2014, 2015  . [place]     2001, 2014, 2015  . TUMOR EXCISION     back of neck    Family History  Problem Relation Age of Onset  . Heart disease Father   . Diabetes Other   . Breast cancer Neg Hx    Social History:  reports that she has never smoked. She has never used smokeless tobacco. She reports that she drinks alcohol. She reports that she does not use drugs.  Allergies:  Allergies  Allergen Reactions  . Tramadol Swelling  . Beta Adrenergic Blockers Hives  . Celebrex [Celecoxib] Other (See Comments)    A lot of pressure in head  . Niacin Other (See Comments)    flushing  . Niacin And Related Other (See Comments)    Flushing and hot sensation  . Oxycodone Nausea Only  . Shellfish Allergy Nausea And Vomiting  . Vicodin [Hydrocodone-Acetaminophen] Other (See Comments)  Hypoglycemic episodes  . Heparin Rash    Medications Prior to Admission  Medication Sig Dispense Refill  . amLODipine (NORVASC) 5 MG tablet Take 5 mg by mouth every morning. AM    . aspirin EC 81 MG tablet Take 81 mg by mouth at bedtime.    Marland Kitchen atorvastatin (LIPITOR) 40 MG tablet Take 40 mg by mouth at bedtime.     . calcium-vitamin D (OSCAL WITH D) 500-200 MG-UNIT tablet Take 1 tablet by mouth daily with breakfast.    . clopidogrel (PLAVIX) 75 MG tablet Take 1 tablet (75 mg total) by mouth daily. 30 tablet 0  . glipiZIDE (GLUCOTROL) 10 MG tablet Take 10 mg by mouth 2 (two) times daily.    . insulin glargine (LANTUS) 100 UNIT/ML injection Inject 36 Units into the skin every morning.    Marland Kitchen losartan (COZAAR) 100 MG  tablet Take 100 mg by mouth every morning. AM    . metoCLOPramide (REGLAN) 10 MG tablet Take 1 tablet (10 mg total) by mouth every 8 (eight) hours as needed for nausea. 30 tablet 0  . ondansetron (ZOFRAN ODT) 8 MG disintegrating tablet Take 8 mg by mouth as needed for nausea or vomiting.    Marland Kitchen oxyCODONE (ROXICODONE) 5 MG/5ML solution Take 5 mLs (5 mg total) by mouth every 4 (four) hours as needed for moderate pain or severe pain. 150 mL 0  . vitamin B-12 (CYANOCOBALAMIN) 1000 MCG tablet Take 1,000 mcg by mouth daily.    Marland Kitchen acyclovir (ZOVIRAX) 400 MG tablet Take 400 mg by mouth as needed.     Marland Kitchen levothyroxine (SYNTHROID, LEVOTHROID) 25 MCG tablet Take 1 tablet (25 mcg total) by mouth daily before breakfast. (Patient not taking: Reported on 04/07/2016) 14 tablet 0    Results for orders placed or performed during the hospital encounter of 04/07/16 (from the past 48 hour(s))  Basic metabolic panel     Status: Abnormal   Collection Time: 04/07/16  9:15 PM  Result Value Ref Range   Sodium 141 135 - 145 mmol/L   Potassium 4.0 3.5 - 5.1 mmol/L   Chloride 108 101 - 111 mmol/L   CO2 23 22 - 32 mmol/L   Glucose, Bld 97 65 - 99 mg/dL   BUN 28 (H) 6 - 20 mg/dL   Creatinine, Ser 1.35 (H) 0.44 - 1.00 mg/dL   Calcium 9.9 8.9 - 10.3 mg/dL   GFR calc non Af Amer 40 (L) >60 mL/min   GFR calc Af Amer 46 (L) >60 mL/min    Comment: (NOTE) The eGFR has been calculated using the CKD EPI equation. This calculation has not been validated in all clinical situations. eGFR's persistently <60 mL/min signify possible Chronic Kidney Disease.    Anion gap 10 5 - 15  CBC     Status: Abnormal   Collection Time: 04/07/16  9:15 PM  Result Value Ref Range   WBC 8.2 3.6 - 11.0 K/uL   RBC 3.87 3.80 - 5.20 MIL/uL   Hemoglobin 12.1 12.0 - 16.0 g/dL   HCT 34.3 (L) 35.0 - 47.0 %   MCV 88.7 80.0 - 100.0 fL   MCH 31.3 26.0 - 34.0 pg   MCHC 35.3 32.0 - 36.0 g/dL   RDW 13.7 11.5 - 14.5 %   Platelets 211 150 - 440 K/uL   Troponin I     Status: None   Collection Time: 04/07/16  9:15 PM  Result Value Ref Range   Troponin I <0.03 <0.03 ng/mL  Lipase, blood     Status: Abnormal   Collection Time: 04/07/16  9:15 PM  Result Value Ref Range   Lipase 63 (H) 11 - 51 U/L  Urinalysis complete, with microscopic ([facility] only)     Status: Abnormal   Collection Time: 04/07/16 10:20 PM  Result Value Ref Range   Color, Urine COLORLESS (A) YELLOW   APPearance CLEAR (A) CLEAR   Glucose, UA NEGATIVE NEGATIVE mg/dL   Bilirubin Urine NEGATIVE NEGATIVE   Ketones, ur NEGATIVE NEGATIVE mg/dL   Specific Gravity, Urine 1.008 1.005 - 1.030   Hgb urine dipstick NEGATIVE NEGATIVE   pH 7.0 5.0 - 8.0   Protein, ur NEGATIVE NEGATIVE mg/dL   Nitrite NEGATIVE NEGATIVE   Leukocytes, UA TRACE (A) NEGATIVE   RBC / HPF 0-5 0 - 5 RBC/hpf   WBC, UA 0-5 0 - 5 WBC/hpf   Bacteria, UA NONE SEEN NONE SEEN   Squamous Epithelial / LPF 0-5 (A) NONE SEEN  Glucose, capillary     Status: Abnormal   Collection Time: 04/08/16  1:15 AM  Result Value Ref Range   Glucose-Capillary 62 (L) 65 - 99 mg/dL  Glucose, capillary     Status: None   Collection Time: 04/08/16  2:12 AM  Result Value Ref Range   Glucose-Capillary 67 65 - 99 mg/dL  Glucose, capillary     Status: Abnormal   Collection Time: 04/08/16  2:38 AM  Result Value Ref Range   Glucose-Capillary 64 (L) 65 - 99 mg/dL  Troponin I     Status: None   Collection Time: 04/08/16  3:03 AM  Result Value Ref Range   Troponin I <0.03 <0.03 ng/mL  Basic metabolic panel     Status: Abnormal   Collection Time: 04/08/16  3:03 AM  Result Value Ref Range   Sodium 142 135 - 145 mmol/L   Potassium 4.1 3.5 - 5.1 mmol/L   Chloride 109 101 - 111 mmol/L   CO2 25 22 - 32 mmol/L   Glucose, Bld 89 65 - 99 mg/dL   BUN 27 (H) 6 - 20 mg/dL   Creatinine, Ser 1.47 (H) 0.44 - 1.00 mg/dL   Calcium 9.1 8.9 - 10.3 mg/dL   GFR calc non Af Amer 36 (L) >60 mL/min   GFR calc Af Amer 42 (L) >60 mL/min    Comment:  (NOTE) The eGFR has been calculated using the CKD EPI equation. This calculation has not been validated in all clinical situations. eGFR's persistently <60 mL/min signify possible Chronic Kidney Disease.    Anion gap 8 5 - 15  CBC     Status: Abnormal   Collection Time: 04/08/16  3:03 AM  Result Value Ref Range   WBC 7.1 3.6 - 11.0 K/uL   RBC 3.50 (L) 3.80 - 5.20 MIL/uL   Hemoglobin 10.9 (L) 12.0 - 16.0 g/dL   HCT 31.7 (L) 35.0 - 47.0 %   MCV 90.4 80.0 - 100.0 fL   MCH 31.1 26.0 - 34.0 pg   MCHC 34.4 32.0 - 36.0 g/dL   RDW 14.0 11.5 - 14.5 %   Platelets 189 150 - 440 K/uL  Glucose, capillary     Status: Abnormal   Collection Time: 04/08/16  4:12 AM  Result Value Ref Range   Glucose-Capillary 232 (H) 65 - 99 mg/dL   Comment 1 Notify RN   Glucose, capillary     Status: Abnormal   Collection Time: 04/08/16  6:16 AM  Result Value Ref Range  Glucose-Capillary 238 (H) 65 - 99 mg/dL   Comment 1 Notify RN   Glucose, capillary     Status: Abnormal   Collection Time: 04/08/16  7:55 AM  Result Value Ref Range   Glucose-Capillary 181 (H) 65 - 99 mg/dL  Troponin I     Status: None   Collection Time: 04/08/16  9:26 AM  Result Value Ref Range   Troponin I <0.03 <0.03 ng/mL  Glucose, capillary     Status: Abnormal   Collection Time: 04/08/16 10:14 AM  Result Value Ref Range   Glucose-Capillary 157 (H) 65 - 99 mg/dL   Comment 1 Notify RN   Glucose, capillary     Status: Abnormal   Collection Time: 04/08/16 11:40 AM  Result Value Ref Range   Glucose-Capillary 138 (H) 65 - 99 mg/dL  Glucose, capillary     Status: Abnormal   Collection Time: 04/08/16  2:48 PM  Result Value Ref Range   Glucose-Capillary 228 (H) 65 - 99 mg/dL  Troponin I     Status: None   Collection Time: 04/08/16  3:29 PM  Result Value Ref Range   Troponin I <0.03 <0.03 ng/mL   Ct Head Wo Contrast  Result Date: 04/08/2016 CLINICAL DATA:  Headache, near syncope, hypertension, and tachycardia starting today. EXAM:  CT HEAD WITHOUT CONTRAST TECHNIQUE: Contiguous axial images were obtained from the base of the skull through the vertex without intravenous contrast. COMPARISON:  06/09/2015 FINDINGS: Brain: No evidence of acute infarction, hemorrhage, hydrocephalus, extra-axial collection or mass lesion/mass effect. Vascular: Vessels appear somewhat dense, probably relating to residual contrast material after CT chest from earlier tonight. Vascular calcifications are present. Skull: Normal. Negative for fracture or focal lesion. Sinuses/Orbits: No acute finding. Other: None. IMPRESSION: No acute intracranial abnormalities. Residual contrast material in the blood pool. This may decrease sensitivity for detection of focal acute intracranial hemorrhage. Electronically Signed   By: Lucienne Capers M.D.   On: 04/08/2016 00:47   Ct Angio Chest Pe W And/or Wo Contrast  Result Date: 04/07/2016 CLINICAL DATA:  Tachycardia and hypertension. EXAM: CT ANGIOGRAPHY CHEST WITH CONTRAST TECHNIQUE: Multidetector CT imaging of the chest was performed using the standard protocol during bolus administration of intravenous contrast. Multiplanar CT image reconstructions and MIPs were obtained to evaluate the vascular anatomy. CONTRAST:  55 mL Isovue 370 IV COMPARISON:  CTA chest 12/01/2015 FINDINGS: Cardiovascular: There is satisfactory opacification of the pulmonary arteries to the segmental level. There is no pulmonary embolus. The main pulmonary artery is within normal limits for size, measuring 2.3 cm at the bifurcation. There is no CT evidence of acute right heart strain. Mild aortic atherosclerosis. Coronary artery stents are noted. There is a normal 3-vessel arch branching pattern. Heart size is normal, without pericardial effusion. Mediastinum/Nodes: No mediastinal, hilar or axillary lymphadenopathy. The visualized thyroid is unremarkable. There is a small hiatal hernia. Lungs/Pleura: Unchanged right middle lobe pulmonary nodule measuring 5  mm. No pleural effusion or pneumothorax. No focal consolidation. Upper Abdomen: Contrast bolus timing is not optimized for evaluation of the abdominal organs. Multiple large gallstones are again seen. There are multiple splenic granulomata. Musculoskeletal: No lytic or blastic osseous lesions. No bony spinal canal stenosis. Review of the MIP images confirms the above findings. IMPRESSION: 1. No pulmonary embolus. 2. Coronary artery stents and aortic atherosclerosis. 3. Unchanged right middle lobe 5 mm pulmonary nodule. Recommend continued annual surveillance until stability of 5 years can be documented. 4. Cholelithiasis without evidence of acute cholecystitis. Electronically Signed  By: Ulyses Jarred M.D.   On: 04/07/2016 23:12   Dg Chest Port 1 View  Result Date: 04/07/2016 CLINICAL DATA:  Hypertension and tachycardia. Hiatal hernia surgery 4 weeks ago. EXAM: PORTABLE CHEST 1 VIEW COMPARISON:  Exams dating back through 06/07/2015 FINDINGS: Heart is top-normal in size. There is aortic atherosclerosis. The hiatal hernia is no longer apparent. There is atelectasis at the right lung base medially. No overt pulmonary edema, effusion or pneumothorax. Soft tissue calcification adjacent the humeral head on the right is again seen which may reflect calcific bursitis possibly rotator cuff tendinopathy, favor bursitis. IMPRESSION: Right basilar atelectasis. Stable soft tissue calcification adjacent to the right humeral head consistent with calcific bursitis or calcific rotator cuff tendinopathy Electronically Signed   By: Ashley Royalty M.D.   On: 04/07/2016 21:38    Review of Systems  Constitutional: Positive for malaise/fatigue. Negative for chills, diaphoresis and fever.  HENT: Negative for congestion and hearing loss.   Respiratory: Negative for cough and shortness of breath.   Cardiovascular: Positive for palpitations. Negative for chest pain and leg swelling.  Gastrointestinal: Positive for nausea. Negative  for abdominal pain, constipation, diarrhea, heartburn and vomiting.  Genitourinary: Negative for hematuria and urgency.  Musculoskeletal: Negative for back pain.  Skin: Negative for itching and rash.  Neurological: Positive for weakness and headaches. Negative for dizziness and loss of consciousness.  Psychiatric/Behavioral: Negative for depression. The patient is not nervous/anxious.   All other systems reviewed and are negative.   Blood pressure 135/60, pulse 88, temperature 98.2 F (36.8 C), temperature source Oral, resp. rate 19, height 5' 2"  (1.575 m), weight 179 lb 1.6 oz (81.2 kg), SpO2 99 %. Physical Exam  Vitals reviewed. Constitutional: She is oriented to person, place, and time. She appears well-developed and well-nourished. No distress.  HENT:  Head: Normocephalic and atraumatic.  Nose: Nose normal.  Mouth/Throat: Oropharynx is clear and moist. No oropharyngeal exudate.  Eyes: Conjunctivae are normal. Pupils are equal, round, and reactive to light. No scleral icterus.  Neck: Normal range of motion. Neck supple. No tracheal deviation present.  Cardiovascular: Normal rate, regular rhythm, normal heart sounds and intact distal pulses.  Exam reveals no gallop and no friction rub.   No murmur heard. Respiratory: Effort normal and breath sounds normal. No respiratory distress. She has no wheezes. She has no rales.  GI: Soft. Bowel sounds are normal. She exhibits no distension. There is no tenderness. There is no rebound.  Incisions c/d/i, no tenderness  Musculoskeletal: Normal range of motion. She exhibits no edema, tenderness or deformity.  Neurological: She is alert and oriented to person, place, and time. No cranial nerve deficit.  Skin: Skin is warm and dry. No rash noted. No erythema. No pallor.  Psychiatric: She has a normal mood and affect. Her behavior is normal. Judgment and thought content normal.     Assessment/Plan  65 yr old female 4 weeks out from Lap nissen  repair, who came in with weakness, malaise, headache and high blood pressure.  Patient is being worked up by medicine to evaluate headaches, malaise and tachycardia currently.  I do not feel this is related to her surgery given the time frame and the fact that she has been tolerating a diet well until a couple days ago.  Surgery is available with any questions or concerns.    Hubbard Robinson, MD 04/08/2016, 4:33 PM

## 2016-04-08 NOTE — Care Management (Signed)
Placed in observation for nausea and headache.  found to have elevated blood pressure. Troponin negative.  has received one amp D50 for blood sugar of 64.  Surgery consult pending.  Patient with recent hiatal hernia surgery. Independent in all adls, denies issues accessing medical care, obtaining medications or with transportation.  Current with PCP.

## 2016-04-08 NOTE — Progress Notes (Signed)
[facility] [place] NAME: Dorothy Rogers    MR#:  [id number]  DATE OF BIRTH:  11-01-1950  SUBJECTIVE:  Came in with nausea and not feeling well right now having a headache 5/10. Feeling a little hungry. No vomiting. Had vomited yesterday.  REVIEW OF SYSTEMS:   Review of Systems  Constitutional: Negative for chills, fever and weight loss.  HENT: Negative for ear discharge, ear pain and nosebleeds.   Eyes: Negative for blurred vision, pain and discharge.  Respiratory: Negative for sputum production, shortness of breath, wheezing and stridor.   Cardiovascular: Negative for chest pain, palpitations, orthopnea and PND.  Gastrointestinal: Positive for nausea. Negative for abdominal pain, diarrhea and vomiting.  Genitourinary: Negative for frequency and urgency.  Musculoskeletal: Negative for back pain and joint pain.  Neurological: Positive for headaches. Negative for sensory change, speech change, focal weakness and weakness.  Psychiatric/Behavioral: Negative for depression and hallucinations. The patient is not nervous/anxious.    Tolerating Diet:reg Tolerating PT: ambulatory  DRUG ALLERGIES:   Allergies  Allergen Reactions  . Tramadol Swelling  . Beta Adrenergic Blockers Hives  . Celebrex [Celecoxib] Other (See Comments)    A lot of pressure in head  . Niacin Other (See Comments)    flushing  . Niacin And Related Other (See Comments)    Flushing and hot sensation  . Oxycodone Nausea Only  . Shellfish Allergy Nausea And Vomiting  . Vicodin [Hydrocodone-Acetaminophen] Other (See Comments)    Hypoglycemic episodes  . Heparin Rash    VITALS:  Blood pressure 139/60, pulse 82, temperature 98.2 F (36.8 C), temperature source Oral, resp. rate 19, height 5\' 2"  (1.575 m), weight 81.2 kg (179 lb 1.6 oz), SpO2 100 %.  PHYSICAL EXAMINATION:   Physical Exam  GENERAL:  65 y.o.-year-old patient lying in the bed with no acute  distress.  EYES: Pupils equal, round, reactive to light and accommodation. No scleral icterus. Extraocular muscles intact.  HEENT: Head atraumatic, normocephalic. Oropharynx and nasopharynx clear.  NECK:  Supple, no jugular venous distention. No thyroid enlargement, no tenderness.  LUNGS: Normal breath sounds bilaterally, no wheezing, rales, rhonchi. No use of accessory muscles of respiration.  CARDIOVASCULAR: S1, S2 normal. No murmurs, rubs, or gallops.  ABDOMEN: Soft, nontender, nondistended. Bowel sounds present. No organomegaly or mass.  EXTREMITIES: No cyanosis, clubbing or edema b/l.    NEUROLOGIC: Cranial nerves II through XII are intact. No focal Motor or sensory deficits b/l.   PSYCHIATRIC:  patient is alert and oriented x 3.  SKIN: No obvious rash, lesion, or ulcer.   LABORATORY PANEL:  CBC  Recent Labs Lab 04/08/16 0303  WBC 7.1  HGB 10.9*  HCT 31.7*  PLT 189    Chemistries   Recent Labs Lab 04/08/16 0303  NA 142  K 4.1  CL 109  CO2 25  GLUCOSE 89  BUN 27*  CREATININE 1.47*  CALCIUM 9.1   Cardiac Enzymes  Recent Labs Lab 04/08/16 0926  TROPONINI <0.03   RADIOLOGY:  Ct Head Wo Contrast  Result Date: 04/08/2016 CLINICAL DATA:  Headache, near syncope, hypertension, and tachycardia starting today. EXAM: CT HEAD WITHOUT CONTRAST TECHNIQUE: Contiguous axial images were obtained from the base of the skull through the vertex without intravenous contrast. COMPARISON:  06/09/2015 FINDINGS: Brain: No evidence of acute infarction, hemorrhage, hydrocephalus, extra-axial collection or mass lesion/mass effect. Vascular: Vessels appear somewhat dense, probably relating to residual contrast material after CT chest from earlier tonight. Vascular calcifications  are present. Skull: Normal. Negative for fracture or focal lesion. Sinuses/Orbits: No acute finding. Other: None. IMPRESSION: No acute intracranial abnormalities. Residual contrast material in the blood pool. This may  decrease sensitivity for detection of focal acute intracranial hemorrhage. Electronically Signed   By: Lucienne Capers M.D.   On: 04/08/2016 00:47   Ct Angio Chest Pe W And/or Wo Contrast  Result Date: 04/07/2016 CLINICAL DATA:  Tachycardia and hypertension. EXAM: CT ANGIOGRAPHY CHEST WITH CONTRAST TECHNIQUE: Multidetector CT imaging of the chest was performed using the standard protocol during bolus administration of intravenous contrast. Multiplanar CT image reconstructions and MIPs were obtained to evaluate the vascular anatomy. CONTRAST:  55 mL Isovue 370 IV COMPARISON:  CTA chest 12/01/2015 FINDINGS: Cardiovascular: There is satisfactory opacification of the pulmonary arteries to the segmental level. There is no pulmonary embolus. The main pulmonary artery is within normal limits for size, measuring 2.3 cm at the bifurcation. There is no CT evidence of acute right heart strain. Mild aortic atherosclerosis. Coronary artery stents are noted. There is a normal 3-vessel arch branching pattern. Heart size is normal, without pericardial effusion. Mediastinum/Nodes: No mediastinal, hilar or axillary lymphadenopathy. The visualized thyroid is unremarkable. There is a small hiatal hernia. Lungs/Pleura: Unchanged right middle lobe pulmonary nodule measuring 5 mm. No pleural effusion or pneumothorax. No focal consolidation. Upper Abdomen: Contrast bolus timing is not optimized for evaluation of the abdominal organs. Multiple large gallstones are again seen. There are multiple splenic granulomata. Musculoskeletal: No lytic or blastic osseous lesions. No bony spinal canal stenosis. Review of the MIP images confirms the above findings. IMPRESSION: 1. No pulmonary embolus. 2. Coronary artery stents and aortic atherosclerosis. 3. Unchanged right middle lobe 5 mm pulmonary nodule. Recommend continued annual surveillance until stability of 5 years can be documented. 4. Cholelithiasis without evidence of acute cholecystitis.  Electronically Signed   By: Ulyses Jarred M.D.   On: 04/07/2016 23:12   Dg Chest Port 1 View  Result Date: 04/07/2016 CLINICAL DATA:  Hypertension and tachycardia. Hiatal hernia surgery 4 weeks ago. EXAM: PORTABLE CHEST 1 VIEW COMPARISON:  Exams dating back through 06/07/2015 FINDINGS: Heart is top-normal in size. There is aortic atherosclerosis. The hiatal hernia is no longer apparent. There is atelectasis at the right lung base medially. No overt pulmonary edema, effusion or pneumothorax. Soft tissue calcification adjacent the humeral head on the right is again seen which may reflect calcific bursitis possibly rotator cuff tendinopathy, favor bursitis. IMPRESSION: Right basilar atelectasis. Stable soft tissue calcification adjacent to the right humeral head consistent with calcific bursitis or calcific rotator cuff tendinopathy Electronically Signed   By: Ashley Royalty M.D.   On: 04/07/2016 21:38   ASSESSMENT AND PLAN:  Reonna Wheeless  is a 65 y.o. female who presents with Malaise, near syncope, tachycardia and hypertension. Patient recently had hiatal hernia repair here. Since that time had been recovering well, until today when she presented with these symptoms. Has significant cardiac history with multiple stents placed in the past.   1.Near syncope - unclear etiology, though perhaps related to her significantly elevated blood pressure. - Patient denies any chest pain or other typical cardiac symptoms. - EKG does not show any evidence for ischemia.  -patient had extensive cardiac workup in the form of cardiac catheterization done in May 2017 which was essentially negative. Her previous echo 2 months ago had EF of 50-55%. No wall motion abnormality. -Cardiac enzymes negative. -Heart rate much improved.  2.  Type 2 diabetes mellitus ([place]) -  sliding scale insulin with corresponding glucose checks  3.  HTN (hypertension) - continue home meds, additional when necessary antihypertensives for blood  pressure goal less than 160/100 -according to the husband blood pressure was fluctuation during yesterday at home. Today blood pressure is very stable. Continue her home meds.  4.  CAD (coronary artery disease) - trend troponins as above, otherwise continue home meds    5.HLD (hyperlipidemia) - on home meds   6.CKD (chronic kidney disease), stage III - avoid nephrotoxins, monitor, currently at baseline  Case discussed with Care Management/Social Worker. Management plans discussed with the patient, family and they are in agreement.  CODE STATUS: full DVT Prophylaxis: Lovenox TOTAL TIME TAKING CARE OF THIS PATIENT: 30 minutes.  >50% time spent on counselling and coordination of care  POSSIBLE D/C IN 1 DAYS, DEPENDING ON CLINICAL CONDITION.  Note: This dictation was prepared with Dragon dictation along with smaller phrase technology. Any transcriptional errors that result from this process are unintentional.  Yasmin Bronaugh M.D on 04/08/2016 at 1:32 PM  Between 7am to 6pm - Pager - [phone]  After 6pm go to [email or web address] - password [email or web address] [facility]  Office  [phone]  CC: Primary care physician; Idelle Crouch, MD

## 2016-04-08 NOTE — Progress Notes (Signed)
Patient arrived to 2A Room 245. Patient denies pain and all questions answered. Patient oriented to unit and Fall Safety Plan signed. Skin assessment completed with Andria Meuse RN and skin intact with healed scars from previous abdominal surgery noted. A&Ox4, VSS, and NSR on verified tele-box #[id number]. Husband at bedside. Call bell within reach, non-skid socks on, and side rails up x2. Nursing staff will continue to monitor for any changes in patient status. Earleen Reaper, RN

## 2016-04-08 NOTE — Progress Notes (Signed)
Inpatient Diabetes Program Recommendations  AACE/ADA: New Consensus Statement on Inpatient Glycemic Control (2015)  Target Ranges:  Prepandial:   less than 140 mg/dL      Peak postprandial:   less than 180 mg/dL (1-2 hours)      Critically ill patients:  140 - 180 mg/dL   Results for Dorothy Rogers, Dorothy Rogers (MRN [id number]) as of 04/08/2016 11:30  Ref. Range 04/08/2016 01:15 04/08/2016 02:12 04/08/2016 02:38 04/08/2016 04:12 04/08/2016 06:16 04/08/2016 07:55 04/08/2016 10:14  Glucose-Capillary Latest Ref Range: 65 - 99 mg/dL 62 (L) 67 64 (L) 232 (H) 238 (H) 181 (H) 157 (H)   Review of Glycemic Control  Diabetes history: DM2 Outpatient Diabetes medications: Lantus 36 units QAM, Glipizide 10 mg BID Current orders for Inpatient glycemic control: Novolog 0-9 units TID with meals, Novolog 0-5 units QHS  Inpatient Diabetes Program Recommendations: Insulin - Basal: Noted patient had hypoglycemia yesterday and current glucose 157 mg/dl. In reviewing the chart, patient has not received any insulin since being admitted. If glucose becomes consistently elevated greater than 180 mg/dl despite receiving Novolog correction, may want to consider ordering low dose Lantus at 8 units Q24H (based on 81 kg x 0.1 units). HgbA1C: Per Care Everywhere, last A1C was 7.6% on 04/05/16.  NOTE: Called patient's room and her son answered the phone. Asked to talk with patient but her son states that she is not feeling well at the moment and does not want to talk on the phone. Inquired if he knew patient's outpatient regimen for DM control and he reports that patient was taking Lantus 36 units QAM and Glipizide 10 mg BID. Patient's son reported that after patient's recent hernia surgery she was using Humalog per sliding scale along with Glipizide but glucose was running between 190-200's mg/dl so her doctor advised her to restart the Lantus. Since she has restarted taking the Lantus about 1 week ago, her glucose has been 140-160 mg/dl at  bedtime and fasting 120-130's mg/dl. Patient's son states that patient has not experienced any issues with hypoglycemia at home but does report that patient has a snack at bedtime usually. Agree with current inpatient glycemic control orders at this time. However, if glucose becomes consistently greater than 180 mg/dl despite Novolog correction, may want to order low dose basal insulin.  Thanks, Barnie Alderman, RN, MSN, CDE Diabetes Coordinator Inpatient Diabetes Program [phone] (Team Pager from 8am to 5pm)

## 2016-04-08 NOTE — ED Notes (Signed)
Patient transported to CT 

## 2016-04-08 NOTE — Progress Notes (Signed)
Notified MD of CBG 64 after second treatment. Orders placed for D5 1/2 NS at 70 mL/hr, 1 ampule of D50, and CBG Q2h for the next 10 hours per MD Pyreddy. Explained treatment to patient and her husband; all questions answered and no concerns at this time. Nursing staff will continue to monitor for any changes in patient status. Earleen Reaper, RN

## 2016-04-08 NOTE — Progress Notes (Signed)
*  PRELIMINARY RESULTS* Echocardiogram 2D Echocardiogram has been performed.  Sherrie Sport 04/08/2016, 10:32 AM

## 2016-04-08 NOTE — Progress Notes (Signed)
0400 CBG 232 and 0600 CBG 238; discontinued IV fluids per parameters set by MD Pyreddy. Nursing staff will continue to monitor for any changes in patient status. Earleen Reaper, RN

## 2016-04-08 NOTE — Care Management Obs Status (Signed)
[facility] NOTIFICATION   Patient Details  Name: DIMA FELLS MRN: [id number] Date of Birth: [birth year]   Medicare Observation Status Notification Given:  Yes    Beau Fanny, RN 04/08/2016, 8:34 AM

## 2016-04-08 NOTE — Progress Notes (Addendum)
Hypoglycemic Event  CBG: 62 at 0115  Treatment: 15 GM carbohydrate snack  Symptoms: Pale  Follow-up CBG: Time: 0212 CBG Result: 67  Possible Reasons for Event: Inadequate meal intake  Comments/MD notified: Second carbohydrate snack given. Will recheck in 15 min and notify MD if CBG still not above 70.    Earleen Reaper

## 2016-04-09 DIAGNOSIS — R55 Syncope and collapse: Secondary | ICD-10-CM | POA: Diagnosis not present

## 2016-04-09 LAB — GLUCOSE, CAPILLARY
GLUCOSE-CAPILLARY: 111 mg/dL — AB (ref 65–99)
GLUCOSE-CAPILLARY: 184 mg/dL — AB (ref 65–99)
Glucose-Capillary: 109 mg/dL — ABNORMAL HIGH (ref 65–99)

## 2016-04-09 LAB — ECHOCARDIOGRAM COMPLETE
HEIGHTINCHES: 62 in
Weight: 2865.6 oz

## 2016-04-09 MED ORDER — POLYETHYLENE GLYCOL 3350 17 G PO PACK
17.0000 g | PACK | Freq: Once | ORAL | Status: AC
  Administered 2016-04-09: 17 g via ORAL
  Filled 2016-04-09: qty 1

## 2016-04-09 MED ORDER — POLYETHYLENE GLYCOL 3350 17 G PO PACK
17.0000 g | PACK | Freq: Once | ORAL | 0 refills | Status: AC
Start: 2016-04-09 — End: 2016-04-09

## 2016-04-09 NOTE — Progress Notes (Signed)
Patient given discharge teaching and paperwork regarding medications, diet, follow-up appointments and activity. Patient understanding verbalized. No complaints at this time. IV and telemetry discontinued prior to leaving. Skin assessment as previously charted and vitals are stable; on room air. Patient being discharged to home. Family present during discharge teaching. No further needs by Care Management. Prescription sent to pharmacy.

## 2016-04-09 NOTE — Discharge Summary (Signed)
[facility] NAME: Dorothy Rogers    MR#:  [id number]  DATE OF BIRTH:  10/04/1950  DATE OF ADMISSION:  04/07/2016 ADMITTING PHYSICIAN: Lance Coon, MD  DATE OF DISCHARGE: 04/09/16  PRIMARY CARE PHYSICIAN: SPARKS,JEFFREY D, MD    ADMISSION DIAGNOSIS:  Dehydration [E86.0] SOB (shortness of breath) [R06.02] Tachycardia [R00.0] Near syncope [R55] Headache [R51]  DISCHARGE DIAGNOSIS:  Uncontrolled BP Tachycardia Dizziness-resolved  SECONDARY DIAGNOSIS:   Past Medical History:  Diagnosis Date  . Anemia   . Anemia   . Arthritis    lower back, hips  . Blood transfusion without reported diagnosis   . CAD (coronary artery disease) FEB AND NOV 2009   6 STENTS  . 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 Cataract   . Chronic kidney disease    Stage III  . Coronary artery disease   . DDD (degenerative disc disease), lumbar   . Diabetes mellitus without complication ([place])    TYPE 2  . Herpes simplex   . Hyperlipidemia   . Hypertension    CONTROLLED ON MEDS  . Vertigo    occasional, no episodes in 2-3 months  . Vertigo    OCCASIONALLY  . Vitamin B 12 deficiency     HOSPITAL COURSE:   GloriaHensonis a 65 y.o.femalewho presents with Malaise, near syncope, tachycardia and hypertension. Patient recently had hiatal hernia repair here. Since that time had been recovering well, until today when she presented with these symptoms. Has significant cardiac history with multiple stents placed in the past.   1.Near syncope - unclear etiology, though perhaps related to her significantlyelevated blood pressure. - Patient denies any chest pain or other typical cardiac symptoms. - EKG does not show any evidence for ischemia.  -patient had extensive cardiac workup in the form of cardiac catheterization done in May 2017 which was essentially negative. Her previous echo 2 months ago had EF of 50-55%. No wall  motion abnormality. -Cardiac enzymes negative. -Heart rate much improved.allergic to BB  2.Type 2 diabetes mellitus (HCC) - sliding scale insulin with corresponding glucose checks  3.HTN (hypertension) - continue home meds, additional when necessary antihypertensives for blood pressure goal less than 160/100 -according to the husband blood pressure was fluctuation during yesterday at home. Today blood pressure is very stable. Continue her home meds.  4.CAD (coronary artery disease) - trend troponins as above, otherwise continue home meds  5.HLD (hyperlipidemia) - on home meds  6.CKD (chronic kidney disease), stage III - avoid nephrotoxins, monitor, currently at baseline  overall improved today. Recommend f/u Gi if her appetite does not improve. Recommend PPI but pt not too keen for it  Constipated for few days. Will give po miralax  D/c home today D/w pt and husband CONSULTS OBTAINED:  Treatment Team:  Hubbard Robinson, MD  DRUG ALLERGIES:   Allergies  Allergen Reactions  . Tramadol Swelling  . Beta Adrenergic Blockers Hives  . Celebrex [Celecoxib] Other (See Comments)    A lot of pressure in head  . Niacin Other (See Comments)    flushing  . Niacin And Related Other (See Comments)    Flushing and hot sensation  . Oxycodone Nausea Only  . Shellfish Allergy Nausea And Vomiting  . Vicodin [Hydrocodone-Acetaminophen] Other (See Comments)    Hypoglycemic episodes  . Heparin Rash    DISCHARGE MEDICATIONS:   Current Discharge Medication List    START taking these medications  Details  polyethylene glycol (MIRALAX / GLYCOLAX) packet Take 17 g by mouth once. Qty: 14 each, Refills: 0      CONTINUE these medications which have NOT CHANGED   Details  amLODipine (NORVASC) 5 MG tablet Take 5 mg by mouth every morning. AM    aspirin EC 81 MG tablet Take 81 mg by mouth at bedtime.    atorvastatin (LIPITOR) 40 MG tablet Take 40 mg by mouth at bedtime.      calcium-vitamin D (OSCAL WITH D) 500-200 MG-UNIT tablet Take 1 tablet by mouth daily with breakfast.    clopidogrel (PLAVIX) 75 MG tablet Take 1 tablet (75 mg total) by mouth daily. Qty: 30 tablet, Refills: 0    glipiZIDE (GLUCOTROL) 10 MG tablet Take 10 mg by mouth 2 (two) times daily.    insulin glargine (LANTUS) 100 UNIT/ML injection Inject 36 Units into the skin every morning.    losartan (COZAAR) 100 MG tablet Take 100 mg by mouth every morning. AM    metoCLOPramide (REGLAN) 10 MG tablet Take 1 tablet (10 mg total) by mouth every 8 (eight) hours as needed for nausea. Qty: 30 tablet, Refills: 0    ondansetron (ZOFRAN ODT) 8 MG disintegrating tablet Take 8 mg by mouth as needed for nausea or vomiting.    oxyCODONE (ROXICODONE) 5 MG/5ML solution Take 5 mLs (5 mg total) by mouth every 4 (four) hours as needed for moderate pain or severe pain. Qty: 150 mL, Refills: 0    vitamin B-12 (CYANOCOBALAMIN) 1000 MCG tablet Take 1,000 mcg by mouth daily.    acyclovir (ZOVIRAX) 400 MG tablet Take 400 mg by mouth as needed.     levothyroxine (SYNTHROID, LEVOTHROID) 25 MCG tablet Take 1 tablet (25 mcg total) by mouth daily before breakfast. Qty: 14 tablet, Refills: 0        If you experience worsening of your admission symptoms, develop shortness of breath, life threatening emergency, suicidal or homicidal thoughts you must seek medical attention immediately by calling 911 or calling your MD immediately  if symptoms less severe.  You Must read complete instructions/literature along with all the possible adverse reactions/side effects for all the Medicines you take and that have been prescribed to you. Take any new Medicines after you have completely understood and accept all the possible adverse reactions/side effects.   Please note  You were cared for by a hospitalist during your hospital stay. If you have any questions about your discharge medications or the care you received while you  were in the hospital after you are discharged, you can call the unit and asked to speak with the hospitalist on call if the hospitalist that took care of you is not available. Once you are discharged, your primary care physician will handle any further medical issues. Please note that NO REFILLS for any discharge medications will be authorized once you are discharged, as it is imperative that you return to your primary care physician (or establish a relationship with a primary care physician if you do not have one) for your aftercare needs so that they can reassess your need for medications and monitor your lab values. Today   SUBJECTIVE    Feels better than y'day VITAL SIGNS:  Blood pressure (!) 142/71, pulse (!) 111, temperature 98.3 F (36.8 C), temperature source Oral, resp. rate 18, height 5\' 2"  (1.575 m), weight 81.2 kg (179 lb 1.6 oz), SpO2 97 %.  I/O:   Intake/Output Summary (Last 24 hours) at 04/09/16 1155 Last  data filed at 04/09/16 0853  Gross per 24 hour  Intake              240 ml  Output             1700 ml  Net            -1460 ml    PHYSICAL EXAMINATION:  GENERAL:  65 y.o.-year-old patient lying in the bed with no acute distress.  EYES: Pupils equal, round, reactive to light and accommodation. No scleral icterus. Extraocular muscles intact.  HEENT: Head atraumatic, normocephalic. Oropharynx and nasopharynx clear.  NECK:  Supple, no jugular venous distention. No thyroid enlargement, no tenderness.  LUNGS: Normal breath sounds bilaterally, no wheezing, rales,rhonchi or crepitation. No use of accessory muscles of respiration.  CARDIOVASCULAR: S1, S2 normal. No murmurs, rubs, or gallops.  ABDOMEN: Soft, non-tender, non-distended. Bowel sounds present. No organomegaly or mass.  EXTREMITIES: No pedal edema, cyanosis, or clubbing.  NEUROLOGIC: Cranial nerves II through XII are intact. Muscle strength 5/5 in all extremities. Sensation intact. Gait not checked.  PSYCHIATRIC: The  patient is alert and oriented x 3.  SKIN: No obvious rash, lesion, or ulcer.   DATA REVIEW:   CBC   Recent Labs Lab 04/08/16 0303  WBC 7.1  HGB 10.9*  HCT 31.7*  PLT 189    Chemistries   Recent Labs Lab 04/08/16 0303  NA 142  K 4.1  CL 109  CO2 25  GLUCOSE 89  BUN 27*  CREATININE 1.47*  CALCIUM 9.1    Microbiology Results   No results found for this or any previous visit (from the past 240 hour(s)).  RADIOLOGY:  Ct Head Wo Contrast  Result Date: 04/08/2016 CLINICAL DATA:  Headache, near syncope, hypertension, and tachycardia starting today. EXAM: CT HEAD WITHOUT CONTRAST TECHNIQUE: Contiguous axial images were obtained from the base of the skull through the vertex without intravenous contrast. COMPARISON:  06/09/2015 FINDINGS: Brain: No evidence of acute infarction, hemorrhage, hydrocephalus, extra-axial collection or mass lesion/mass effect. Vascular: Vessels appear somewhat dense, probably relating to residual contrast material after CT chest from earlier tonight. Vascular calcifications are present. Skull: Normal. Negative for fracture or focal lesion. Sinuses/Orbits: No acute finding. Other: None. IMPRESSION: No acute intracranial abnormalities. Residual contrast material in the blood pool. This may decrease sensitivity for detection of focal acute intracranial hemorrhage. Electronically Signed   By: Lucienne Capers M.D.   On: 04/08/2016 00:47   Ct Angio Chest Pe W And/or Wo Contrast  Result Date: 04/07/2016 CLINICAL DATA:  Tachycardia and hypertension. EXAM: CT ANGIOGRAPHY CHEST WITH CONTRAST TECHNIQUE: Multidetector CT imaging of the chest was performed using the standard protocol during bolus administration of intravenous contrast. Multiplanar CT image reconstructions and MIPs were obtained to evaluate the vascular anatomy. CONTRAST:  55 mL Isovue 370 IV COMPARISON:  CTA chest 12/01/2015 FINDINGS: Cardiovascular: There is satisfactory opacification of the pulmonary  arteries to the segmental level. There is no pulmonary embolus. The main pulmonary artery is within normal limits for size, measuring 2.3 cm at the bifurcation. There is no CT evidence of acute right heart strain. Mild aortic atherosclerosis. Coronary artery stents are noted. There is a normal 3-vessel arch branching pattern. Heart size is normal, without pericardial effusion. Mediastinum/Nodes: No mediastinal, hilar or axillary lymphadenopathy. The visualized thyroid is unremarkable. There is a small hiatal hernia. Lungs/Pleura: Unchanged right middle lobe pulmonary nodule measuring 5 mm. No pleural effusion or pneumothorax. No focal consolidation. Upper Abdomen: Contrast bolus timing is  not optimized for evaluation of the abdominal organs. Multiple large gallstones are again seen. There are multiple splenic granulomata. Musculoskeletal: No lytic or blastic osseous lesions. No bony spinal canal stenosis. Review of the MIP images confirms the above findings. IMPRESSION: 1. No pulmonary embolus. 2. Coronary artery stents and aortic atherosclerosis. 3. Unchanged right middle lobe 5 mm pulmonary nodule. Recommend continued annual surveillance until stability of 5 years can be documented. 4. Cholelithiasis without evidence of acute cholecystitis. Electronically Signed   By: Ulyses Jarred M.D.   On: 04/07/2016 23:12   Dg Chest Port 1 View  Result Date: 04/07/2016 CLINICAL DATA:  Hypertension and tachycardia. Hiatal hernia surgery 4 weeks ago. EXAM: PORTABLE CHEST 1 VIEW COMPARISON:  Exams dating back through 06/07/2015 FINDINGS: Heart is top-normal in size. There is aortic atherosclerosis. The hiatal hernia is no longer apparent. There is atelectasis at the right lung base medially. No overt pulmonary edema, effusion or pneumothorax. Soft tissue calcification adjacent the humeral head on the right is again seen which may reflect calcific bursitis possibly rotator cuff tendinopathy, favor bursitis. IMPRESSION: Right  basilar atelectasis. Stable soft tissue calcification adjacent to the right humeral head consistent with calcific bursitis or calcific rotator cuff tendinopathy Electronically Signed   By: Ashley Royalty M.D.   On: 04/07/2016 21:38     Management plans discussed with the patient, family and they are in agreement.  CODE STATUS:     Code Status Orders        Start     Ordered   04/08/16 0109  Full code  Continuous     04/08/16 0108    Code Status History    Date Active Date Inactive Code Status Order ID Comments User Context   03/11/2016  2:09 PM 03/14/2016  4:10 PM Full Code [id number]  Clayburn Pert, MD Inpatient   07/10/2015 10:47 AM 07/11/2015  6:33 PM Full Code [id number]  Bettey Costa, MD Inpatient   06/09/2015 10:56 PM 06/10/2015 10:12 PM Full Code [id number]  Lance Coon, MD Inpatient    Advance Directive Documentation   Flowsheet Row Most Recent Value  Type of Advance Directive  Healthcare Power of Attorney, Living will  Pre-existing out of facility DNR order (yellow form or pink MOST form)  No data  "MOST" Form in Place?  No data      TOTAL TIME TAKING CARE OF THIS PATIENT: 40 minutes.    Kyrese Gartman M.D on 04/09/2016 at 11:55 AM  Between 7am to 6pm - Pager - [phone] After 6pm go to [email or web address] - password [email or web address]  Office  [phone]  CC: Primary care physician; Idelle Crouch, MD

## 2016-04-16 ENCOUNTER — Encounter: Payer: Self-pay | Admitting: General Surgery

## 2016-04-16 ENCOUNTER — Ambulatory Visit (INDEPENDENT_AMBULATORY_CARE_PROVIDER_SITE_OTHER): Payer: Medicare Other | Admitting: General Surgery

## 2016-04-16 VITALS — BP 156/90 | HR 102 | Temp 99.0°F | Ht 62.0 in

## 2016-04-16 DIAGNOSIS — Z4889 Encounter for other specified surgical aftercare: Secondary | ICD-10-CM

## 2016-04-16 NOTE — Progress Notes (Signed)
Outpatient Surgical Follow Up  04/16/2016  Dorothy Rogers is an 65 y.o. female.   Chief Complaint  Patient presents with  . Routine Post Op    Paraesophageal Hernia Repair (03/11/16)- Dr. Adonis Huguenin    HPI: 65 year old female returns for a third postop status post laparoscopic paraesophageal hernia repair. Patient reports that she is gradually been able to eat more and more without any nausea. She's having intermittent nausea during the entirety of her postoperative time but this is gotten better and better. She's had 2 admissions since she was seen by me last due to uncontrolled hypertension and tachycardia. This is completely unrelated to the surgery that was performed and has been found to be related to her development of insulin intolerance. She denies any fevers, chills, vomiting, diarrhea, constipation currently. The shortness of breath and reflux symptoms she was having before surgery have been completely resolved.  Past Medical History:  Diagnosis Date  . Anemia   . Anemia   . Arthritis    lower back, hips  . Blood transfusion without reported diagnosis   . CAD (coronary artery disease) FEB AND NOV 2009   6 STENTS  . 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 Cataract   . Chronic kidney disease    Stage III  . Coronary artery disease   . DDD (degenerative disc disease), lumbar   . Diabetes mellitus without complication ([place])    TYPE 2  . Herpes simplex   . Hyperlipidemia   . Hypertension    CONTROLLED ON MEDS  . Vertigo    occasional, no episodes in 2-3 months  . Vertigo    OCCASIONALLY  . Vitamin B 12 deficiency     Past Surgical History:  Procedure Laterality Date  . [place] STUDY N/A 02/04/2016   Procedure: [place] STUDY;  Surgeon: Lucilla Lame, MD;  Location: [facility] ENDOSCOPY;  Service: Endoscopy;  Laterality: N/A;  . ABDOMINAL HYSTERECTOMY    . BACK SURGERY     Lumbar spinal fusion 05/30/2015  . CARDIAC CATHETERIZATION  2009  . CATARACT  EXTRACTION Left   . CATARACT EXTRACTION W/PHACO Right 03/19/2015   Procedure: CATARACT EXTRACTION PHACO AND INTRAOCULAR LENS PLACEMENT ([place]);  Surgeon: Leandrew Koyanagi, MD;  Location: [facility];  Service: Ophthalmology;  Laterality: Right;  DIABETIC - insulin and oral meds  . CORONARY STENT PLACEMENT  03/2008  . CYSTECTOMY Right    wrist  . ESOPHAGEAL MANOMETRY N/A 02/04/2016   Procedure: ESOPHAGEAL MANOMETRY (EM);  Surgeon: Lucilla Lame, MD;  Location: [facility] ENDOSCOPY;  Service: Endoscopy;  Laterality: N/A;  . ESOPHAGOGASTRODUODENOSCOPY (EGD) WITH PROPOFOL N/A 09/04/2015   Procedure: ESOPHAGOGASTRODUODENOSCOPY (EGD) WITH PROPOFOL;  Surgeon: Lucilla Lame, MD;  Location: [facility] [facility];  Service: Endoscopy;  Laterality: N/A;  INSULIN DEPENDENT DIABETIC  . EYE SURGERY Left    cataract extraction  . LUMBAR FUSION  2001, 2014, 2015  . [place]     2001, 2014, 2015  . TUMOR EXCISION     back of neck    Family History  Problem Relation Age of Onset  . Heart disease Father   . Diabetes Other   . Breast cancer Neg Hx     Social History:  reports that she has never smoked. She has never used smokeless tobacco. She reports that she drinks alcohol. She reports that she does not use drugs.  Allergies:  Allergies  Allergen Reactions  . Tramadol Swelling  . Beta Adrenergic Blockers  Hives  . Celebrex [Celecoxib] Other (See Comments)    A lot of pressure in head  . Niacin Other (See Comments)    flushing  . Niacin And Related Other (See Comments)    Flushing and hot sensation  . Oxycodone Nausea Only  . Shellfish Allergy Nausea And Vomiting  . Vicodin [Hydrocodone-Acetaminophen] Other (See Comments)    Hypoglycemic episodes  . Heparin Rash    Medications reviewed.    ROS A multipoint review of systems was completed, all pertinent positives and negatives are documented within the history of present illness and remainder are negative.   BP (!) 156/90   Pulse (!)  102   Temp 99 F (37.2 C) (Oral)   Ht 5\' 2"  (1.575 m)   Physical Exam Gen.: No acute distress Chest: Clear to auscultation Heart: Regular rate and rhythm Abdomen: Soft, nontender, nondistended.    No results found for this or any previous visit (from the past 48 hour(s)). No results found.  Assessment/Plan:  1. Aftercare following surgery 65 year old female 5 weeks status post laparoscopic paraesophageal hernia repair. Doing well from a postoperative standpoint. Discussed slowly resuming a normal diet at this time. She is to fully chew everything before swallowing and make sure everything passes through her esophagus prior to eating more food. Patient voiced understanding. Provided with standard postoperative precautions and she'll follow-up in clinic on an as-needed basis.     Clayburn Pert, MD FACS General Surgeon  04/16/2016,4:13 PM

## 2016-04-16 NOTE — Patient Instructions (Signed)
Please advance your diet as tolerated until you are at a REGULAR diet but do this very SLOWLY.  Make sure that you follow-up with the endocrinologist in regards to your glucose.  Please call if you have any questions or concerns.  DASH Eating Plan DASH stands for "Dietary Approaches to Stop Hypertension." The DASH eating plan is a healthy eating plan that has been shown to reduce high blood pressure (hypertension). Additional health benefits may include reducing the risk of type 2 diabetes mellitus, heart disease, and stroke. The DASH eating plan may also help with weight loss. What do I need to know about the DASH eating plan? For the DASH eating plan, you will follow these general guidelines:  Choose foods with less than 150 milligrams of sodium per serving (as listed on the food label).  Use salt-free seasonings or herbs instead of table salt or sea salt.  Check with your health care provider or pharmacist before using salt substitutes.  Eat lower-sodium products. These are often labeled as "low-sodium" or "no salt added."  Eat fresh foods. Avoid eating a lot of canned foods.  Eat more vegetables, fruits, and low-fat dairy products.  Choose whole grains. Look for the word "whole" as the first word in the ingredient list.  Choose fish and skinless chicken or [place] more often than red meat. Limit fish, poultry, and meat to 6 oz (170 g) each day.  Limit sweets, desserts, sugars, and sugary drinks.  Choose heart-healthy fats.  Eat more home-cooked food and less restaurant, buffet, and fast food.  Limit fried foods.  Do not fry foods. Cook foods using methods such as baking, boiling, grilling, and broiling instead.  When eating at a restaurant, ask that your food be prepared with less salt, or no salt if possible. What foods can I eat? Seek help from a dietitian for individual calorie needs. Grains  Whole grain or whole wheat bread. Brown rice. Whole grain or whole wheat  pasta. Quinoa, bulgur, and whole grain cereals. Low-sodium cereals. Corn or whole wheat flour tortillas. Whole grain cornbread. Whole grain crackers. Low-sodium crackers. Vegetables  Fresh or frozen vegetables (raw, steamed, roasted, or grilled). Low-sodium or reduced-sodium tomato and vegetable juices. Low-sodium or reduced-sodium tomato sauce and paste. Low-sodium or reduced-sodium canned vegetables. Fruits  All fresh, canned (in natural juice), or frozen fruits. Meat and Other Protein Products  Ground beef (85% or leaner), grass-fed beef, or beef trimmed of fat. Skinless chicken or [place]. Ground chicken or [place]. Pork trimmed of fat. All fish and seafood. Eggs. Dried beans, peas, or lentils. Unsalted nuts and seeds. Unsalted canned beans. Dairy  Low-fat dairy products, such as skim or 1% milk, 2% or reduced-fat cheeses, low-fat ricotta or cottage cheese, or plain low-fat yogurt. Low-sodium or reduced-sodium cheeses. Fats and Oils  Tub margarines without trans fats. Light or reduced-fat mayonnaise and salad dressings (reduced sodium). Avocado. Safflower, olive, or canola oils. Natural peanut or almond butter. Other  Unsalted popcorn and pretzels. The items listed above may not be a complete list of recommended foods or beverages. Contact your dietitian for more options.  What foods are not recommended? Grains  White bread. White pasta. White rice. Refined cornbread. Bagels and croissants. Crackers that contain trans fat. Vegetables  Creamed or fried vegetables. Vegetables in a cheese sauce. Regular canned vegetables. Regular canned tomato sauce and paste. Regular tomato and vegetable juices. Fruits  Canned fruit in light or heavy syrup. Fruit juice. Meat and Other Protein Products  Fatty cuts  of meat. Ribs, chicken wings, bacon, sausage, bologna, salami, chitterlings, fatback, hot dogs, bratwurst, and packaged luncheon meats. Salted nuts and seeds. Canned beans with salt. Dairy  Whole or  2% milk, cream, half-and-half, and cream cheese. Whole-fat or sweetened yogurt. Full-fat cheeses or blue cheese. Nondairy creamers and whipped toppings. Processed cheese, cheese spreads, or cheese curds. Condiments  Onion and garlic salt, seasoned salt, table salt, and sea salt. Canned and packaged gravies. Worcestershire sauce. Tartar sauce. Barbecue sauce. Teriyaki sauce. Soy sauce, including reduced sodium. Steak sauce. Fish sauce. Oyster sauce. Cocktail sauce. Horseradish. Ketchup and mustard. Meat flavorings and tenderizers. Bouillon cubes. Hot sauce. Tabasco sauce. Marinades. Taco seasonings. Relishes. Fats and Oils  Butter, stick margarine, lard, shortening, ghee, and bacon fat. Coconut, palm kernel, or palm oils. Regular salad dressings. Other  Pickles and olives. Salted popcorn and pretzels. The items listed above may not be a complete list of foods and beverages to avoid. Contact your dietitian for more information.  Where can I find more information? National Heart, Lung, and Blood Institute: travelstabloid.com This information is not intended to replace advice given to you by your health care provider. Make sure you discuss any questions you have with your health care provider. Document Released: 05/06/2011 Document Revised: 10/23/2015 Document Reviewed: 03/21/2013 Elsevier Interactive Patient Education  2017 [employer].

## 2016-04-26 ENCOUNTER — Ambulatory Visit: Payer: Self-pay | Admitting: Surgery

## 2016-04-30 ENCOUNTER — Emergency Department
Admission: EM | Admit: 2016-04-30 | Discharge: 2016-04-30 | Disposition: A | Discharge status: Home / Self Care | Payer: Medicare Other | Attending: Emergency Medicine | Admitting: Emergency Medicine

## 2016-04-30 DIAGNOSIS — E1122 Type 2 diabetes mellitus with diabetic chronic kidney disease: Secondary | ICD-10-CM | POA: Diagnosis not present

## 2016-04-30 DIAGNOSIS — N183 Chronic kidney disease, stage 3 (moderate): Secondary | ICD-10-CM | POA: Diagnosis not present

## 2016-04-30 DIAGNOSIS — Z794 Long term (current) use of insulin: Secondary | ICD-10-CM | POA: Diagnosis not present

## 2016-04-30 DIAGNOSIS — I2510 Atherosclerotic heart disease of native coronary artery without angina pectoris: Secondary | ICD-10-CM | POA: Insufficient documentation

## 2016-04-30 DIAGNOSIS — R000 Tachycardia, unspecified: Secondary | ICD-10-CM | POA: Diagnosis not present

## 2016-04-30 DIAGNOSIS — Z79899 Other long term (current) drug therapy: Secondary | ICD-10-CM | POA: Insufficient documentation

## 2016-04-30 DIAGNOSIS — Z7982 Long term (current) use of aspirin: Secondary | ICD-10-CM | POA: Diagnosis not present

## 2016-04-30 DIAGNOSIS — R110 Nausea: Secondary | ICD-10-CM | POA: Diagnosis present

## 2016-04-30 DIAGNOSIS — N390 Urinary tract infection, site not specified: Secondary | ICD-10-CM

## 2016-04-30 DIAGNOSIS — I129 Hypertensive chronic kidney disease with stage 1 through stage 4 chronic kidney disease, or unspecified chronic kidney disease: Secondary | ICD-10-CM | POA: Insufficient documentation

## 2016-04-30 LAB — CBC WITH DIFFERENTIAL/PLATELET
BASOS ABS: 0.1 10*3/uL (ref 0–0.1)
Basophils Relative: 1 %
EOS PCT: 0 %
Eosinophils Absolute: 0 10*3/uL (ref 0–0.7)
HCT: 33.9 % — ABNORMAL LOW (ref 35.0–47.0)
Hemoglobin: 11.8 g/dL — ABNORMAL LOW (ref 12.0–16.0)
LYMPHS PCT: 20 %
Lymphs Abs: 1.5 10*3/uL (ref 1.0–3.6)
MCH: 31.3 pg (ref 26.0–34.0)
MCHC: 34.9 g/dL (ref 32.0–36.0)
MCV: 89.8 fL (ref 80.0–100.0)
Monocytes Absolute: 0.4 10*3/uL (ref 0.2–0.9)
Monocytes Relative: 5 %
NEUTROS ABS: 5.5 10*3/uL (ref 1.4–6.5)
Neutrophils Relative %: 74 %
PLATELETS: 219 10*3/uL (ref 150–440)
RBC: 3.77 MIL/uL — AB (ref 3.80–5.20)
RDW: 14.7 % — ABNORMAL HIGH (ref 11.5–14.5)
WBC: 7.6 10*3/uL (ref 3.6–11.0)

## 2016-04-30 LAB — COMPREHENSIVE METABOLIC PANEL
ALT: 23 U/L (ref 14–54)
AST: 20 U/L (ref 15–41)
Albumin: 4.3 g/dL (ref 3.5–5.0)
Alkaline Phosphatase: 86 U/L (ref 38–126)
Anion gap: 10 (ref 5–15)
BUN: 36 mg/dL — AB (ref 6–20)
CHLORIDE: 105 mmol/L (ref 101–111)
CO2: 19 mmol/L — AB (ref 22–32)
CREATININE: 1.64 mg/dL — AB (ref 0.44–1.00)
Calcium: 9.5 mg/dL (ref 8.9–10.3)
GFR calc Af Amer: 37 mL/min — ABNORMAL LOW (ref >60)
GFR, EST NON AFRICAN AMERICAN: 32 mL/min — AB (ref >60)
GLUCOSE: 341 mg/dL — AB (ref 65–99)
Potassium: 4.8 mmol/L (ref 3.5–5.1)
Sodium: 134 mmol/L — ABNORMAL LOW (ref 135–145)
Total Bilirubin: 0.7 mg/dL (ref 0.3–1.2)
Total Protein: 7.6 g/dL (ref 6.5–8.1)

## 2016-04-30 LAB — URINALYSIS COMPLETE WITH MICROSCOPIC (ARMC ONLY)
Bilirubin Urine: NEGATIVE
Glucose, UA: >500 mg/dL — AB
Hgb urine dipstick: NEGATIVE
Ketones, ur: NEGATIVE mg/dL
Nitrite: NEGATIVE
PH: 5 (ref 5.0–8.0)
Protein, ur: NEGATIVE mg/dL
Specific Gravity, Urine: 1.011 (ref 1.005–1.030)

## 2016-04-30 LAB — LIPASE, BLOOD: Lipase: 42 U/L (ref 11–51)

## 2016-04-30 LAB — TROPONIN I: Troponin I: <0.03 ng/mL (ref <0.03)

## 2016-04-30 LAB — GLUCOSE, CAPILLARY: GLUCOSE-CAPILLARY: 297 mg/dL — AB (ref 65–99)

## 2016-04-30 MED ORDER — SODIUM CHLORIDE 0.9 % IV BOLUS (SEPSIS)
1000.0000 mL | Freq: Once | INTRAVENOUS | Status: AC
  Administered 2016-04-30: 1000 mL via INTRAVENOUS

## 2016-04-30 MED ORDER — METOCLOPRAMIDE HCL 5 MG/ML IJ SOLN
10.0000 mg | Freq: Once | INTRAMUSCULAR | Status: AC
Start: 2016-04-30 — End: 2016-04-30
  Administered 2016-04-30: 10 mg via INTRAVENOUS
  Filled 2016-04-30: qty 2

## 2016-04-30 MED ORDER — CEPHALEXIN 500 MG PO CAPS: 500.0000 mg | ORAL_CAPSULE | Freq: Three times a day (TID) | ORAL | 0 refills | Status: AC

## 2016-04-30 MED ORDER — DIPHENHYDRAMINE HCL 50 MG/ML IJ SOLN
25.0000 mg | Freq: Once | INTRAMUSCULAR | Status: AC
  Administered 2016-04-30: 25 mg via INTRAVENOUS
  Filled 2016-04-30: qty 1

## 2016-04-30 MED ORDER — CEPHALEXIN 500 MG PO CAPS
500.0000 mg | ORAL_CAPSULE | Freq: Once | ORAL | Status: AC
  Administered 2016-04-30: 500 mg via ORAL
  Filled 2016-04-30: qty 1

## 2016-04-30 NOTE — ED Notes (Signed)
Pt states labile BP and headache that began yesterday, states constant nausea but no vomiting or diarhea, states she saw her endocrinologist yesterday and was started on victoza but has not taken it yet, states her sugars of recent have been elevated, pt states just not feeling well, husband states pt has had several episodes of same symptoms seems like after hital hernia surgrey, pt sitting on side of bed, pt awake but states not feeling welll, EDP at bedside

## 2016-04-30 NOTE — ED Notes (Signed)
Pt given graham crackers, peanut butter and applesauce, husband at bedside

## 2016-04-30 NOTE — ED Provider Notes (Signed)
[facility] Emergency Department Provider Note  ____________________________________________   First MD Initiated Contact with Patient 04/30/16 0700     (approximate)  I have reviewed the triage vital signs and the nursing notes.   HISTORY  Chief Complaint Nausea   HPI Dorothy Rogers is a 65 y.o. female with a history of chronic kidney disease as well as CAD on Plavix was present. Emergency department today with nausea as well as tachycardia. The patient says that her nausea started suddenly yesterday and then she is also complaining of a 3 out of 10 right-sided headache which started this morning. She describes the headache is dull and a 3 out of 10. She denies any pain. Denies any feelings that she will pass out. Her husband is also concerned about her blood pressure fluctuating from 180 to 150 at different times yesterday.  Denies any vomiting. Denies any diarrhea.  Patient with multiple episodes including an admission in November. Episodes are to be related to dehydration. Patient also denying any stress at this time or any known instigating factors.  Past Medical History:  Diagnosis Date  . Anemia   . Anemia   . Arthritis    lower back, hips  . Blood transfusion without reported diagnosis   . CAD (coronary artery disease) FEB AND NOV 2009   6 STENTS  . 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 Cataract   . Chronic kidney disease    Stage III  . Coronary artery disease   . DDD (degenerative disc disease), lumbar   . Diabetes mellitus without complication ([place])    TYPE 2  . Herpes simplex   . Hyperlipidemia   . Hypertension    CONTROLLED ON MEDS  . Vertigo    occasional, no episodes in 2-3 months  . Vertigo    OCCASIONALLY  . Vitamin B 12 deficiency     Patient Active Problem List   Diagnosis Date Noted  . SOB (shortness of breath)   . Near syncope 04/07/2016  . 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 Non-intractable  cyclical vomiting with nausea   . Congenital esophageal defect   . Hiatal hernia   . Gastritis   . Diarrhea 07/10/2015  . Diabetes mellitus ([place]) 07/02/2015  . Adjustment disorder 06/16/2015  . Abdominal pain 06/16/2015  . C. difficile colitis 06/16/2015  . Intractable nausea and vomiting 06/09/2015  . Dehydration 06/09/2015  . Type 2 diabetes mellitus ([place]) 06/09/2015  . HLD (hyperlipidemia) 06/09/2015  . HTN (hypertension) 06/09/2015  . CAD (coronary artery disease) 06/09/2015  . CKD (chronic kidney disease), stage III 06/09/2015  . Thrombocytopenia ([place]) 05/30/2015  . Lumbar canal stenosis 05/30/2015  . Disc disease with myelopathy, lumbar 05/30/2015  . Presence of stent in coronary artery 05/30/2015  . 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 Status post lumbar spinal fusion 12/25/2014  . DDD (degenerative disc disease), lumbar 11/18/2014  . Lumbar post-laminectomy syndrome 11/18/2014  . Facet syndrome, lumbar 11/18/2014  . Piriformis syndrome of left side 11/18/2014  . Status post lumbar spine operation 05/09/2014  . Adiposity 04/23/2014  . Thoracic and lumbosacral neuritis 03/27/2013  . H/O arthrodesis 03/27/2013  . Peripheral vascular disease ([place]) 03/22/2013  . Hypomagnesemia 03/22/2013  . Absolute anemia 03/22/2013    Past Surgical History:  Procedure Laterality Date  . [place] STUDY N/A 02/04/2016   Procedure: [place] STUDY;  Surgeon: Lucilla Lame, MD;  Location: [facility] ENDOSCOPY;  Service: Endoscopy;  Laterality:  N/A;  . ABDOMINAL HYSTERECTOMY    . BACK SURGERY     Lumbar spinal fusion 05/30/2015  . CARDIAC CATHETERIZATION  2009  . CATARACT EXTRACTION Left   . CATARACT EXTRACTION W/PHACO Right 03/19/2015   Procedure: CATARACT EXTRACTION PHACO AND INTRAOCULAR LENS PLACEMENT ([place]);  Surgeon: Leandrew Koyanagi, MD;  Location: [place];  Service: Ophthalmology;  Laterality: Right;  DIABETIC - insulin and oral meds  . CORONARY STENT PLACEMENT   03/2008  . CYSTECTOMY Right    wrist  . ESOPHAGEAL MANOMETRY N/A 02/04/2016   Procedure: ESOPHAGEAL MANOMETRY (EM);  Surgeon: Lucilla Lame, MD;  Location: [facility] ENDOSCOPY;  Service: Endoscopy;  Laterality: N/A;  . ESOPHAGOGASTRODUODENOSCOPY (EGD) WITH PROPOFOL N/A 09/04/2015   Procedure: ESOPHAGOGASTRODUODENOSCOPY (EGD) WITH PROPOFOL;  Surgeon: Lucilla Lame, MD;  Location: [place];  Service: Endoscopy;  Laterality: N/A;  INSULIN DEPENDENT DIABETIC  . EYE SURGERY Left    cataract extraction  . LUMBAR FUSION  2001, 2014, 2015  . [place]     2001, 2014, 2015  . TUMOR EXCISION     back of neck    Prior to Admission medications   Medication Sig Start Date End Date Taking? Authorizing Provider  acyclovir (ZOVIRAX) 400 MG tablet Take 400 mg by mouth as needed.  08/07/15  Yes Historical Provider, MD  amLODipine (NORVASC) 5 MG tablet Take 5 mg by mouth every morning. AM 08/29/14  Yes Historical Provider, MD  aspirin EC 81 MG tablet Take 81 mg by mouth at bedtime.   Yes Historical Provider, MD  atorvastatin (LIPITOR) 40 MG tablet Take 40 mg by mouth at bedtime.  08/16/14  Yes Historical Provider, MD  calcium-vitamin D (OSCAL WITH D) 500-200 MG-UNIT tablet Take 1 tablet by mouth daily with breakfast.   Yes Historical Provider, MD  clopidogrel (PLAVIX) 75 MG tablet Take 1 tablet (75 mg total) by mouth daily. 07/11/15  Yes Sital Mody, MD  glipiZIDE (GLUCOTROL) 10 MG tablet Take 10 mg by mouth 2 (two) times daily.   Yes Historical Provider, MD  losartan (COZAAR) 100 MG tablet Take 100 mg by mouth every morning. AM 02/05/14  Yes Historical Provider, MD  ondansetron (ZOFRAN ODT) 8 MG disintegrating tablet Take 8 mg by mouth as needed for nausea or vomiting.   Yes Historical Provider, MD  VICTOZA 18 MG/3ML SOPN  04/29/16  Yes Historical Provider, MD  vitamin B-12 (CYANOCOBALAMIN) 1000 MCG tablet Take 1,000 mcg by mouth daily.   Yes Historical Provider, MD  insulin lispro (HUMALOG) 100 UNIT/ML  injection Inject 2-4 Units into the skin 2 (two) times daily.     Historical Provider, MD  levothyroxine (SYNTHROID, LEVOTHROID) 25 MCG tablet Take 1 tablet (25 mcg total) by mouth daily before breakfast. Patient not taking: Reported on 04/30/2016 03/28/16   Nance Pear, MD  metoCLOPramide (REGLAN) 10 MG tablet Take 1 tablet (10 mg total) by mouth every 8 (eight) hours as needed for nausea. 03/28/16 03/28/17  Nance Pear, MD  oxyCODONE (ROXICODONE) 5 MG/5ML solution Take 5 mLs (5 mg total) by mouth every 4 (four) hours as needed for moderate pain or severe pain. Patient not taking: Reported on 04/30/2016 03/14/16   Clayburn Pert, MD    Allergies Tramadol; Beta adrenergic blockers; Celebrex [celecoxib]; Niacin; Niacin and related; Oxycodone; Shellfish allergy; Vicodin [hydrocodone-acetaminophen]; and Heparin  Family History  Problem Relation Age of Onset  . Heart disease Father   . Diabetes Other   . Breast cancer Neg Hx  Social History Social History  Substance Use Topics  . Smoking status: Never Smoker  . Smokeless tobacco: Never Used  . Alcohol use 0.0 oz/week     Comment: 2-3 drinks per year    Review of Systems Constitutional: No fever/chills Eyes: No visual changes. ENT: No sore throat. Cardiovascular: Denies chest pain. Respiratory: Denies shortness of breath. Gastrointestinal: No abdominal pain.  no vomiting.  No diarrhea.  No constipation. Genitourinary: Negative for dysuria. Musculoskeletal: Negative for back pain. Skin: Negative for rash. Neurological: Negative for headaches, focal weakness or numbness.  10-point ROS otherwise negative.  ____________________________________________   PHYSICAL EXAM:  VITAL SIGNS: ED Triage Vitals  Enc Vitals Group     BP 04/30/16 0641 136/83     Pulse Rate 04/30/16 0641 (!) 123     Resp 04/30/16 0641 (!) 22     Temp 04/30/16 0641 98.3 F (36.8 C)     Temp Source 04/30/16 0641 Oral     SpO2 04/30/16 0641 100 %      Weight 04/30/16 0642 173 lb (78.5 kg)     Height 04/30/16 0642 5\' 2"  (1.575 m)     Head Circumference --      Peak Flow --      Pain Score 04/30/16 0642 3     Pain Loc --      Pain Edu? --      Excl. in [place]? --     Constitutional: Alert and oriented. in no acute distress. Eyes: Conjunctivae are normal. PERRL. EOMI. Head: Atraumatic. Nose: No congestion/rhinnorhea. Mouth/Throat: Mucous membranes are moist.   Neck: No stridor.   Cardiovascular: Tachycardic, regular rhythm. Grossly normal heart sounds.   Respiratory: Normal respiratory effort.  No retractions. Lungs CTAB. Gastrointestinal: Soft and nontender. No distention.  Musculoskeletal: No lower extremity tenderness nor edema.  No joint effusions. Neurologic:  Normal speech and language. No gross focal neurologic deficits are appreciated. No gait instability. Skin:  Skin is warm, dry and intact. No rash noted. Psychiatric: Mood and affect are normal. Speech and behavior are normal.  ____________________________________________   LABS (all labs ordered are listed, but only abnormal results are displayed)  Labs Reviewed  CBC WITH DIFFERENTIAL/PLATELET - Abnormal; Notable for the following:       Result Value   RBC 3.77 (*)    Hemoglobin 11.8 (*)    HCT 33.9 (*)    RDW 14.7 (*)    All other components within normal limits  COMPREHENSIVE METABOLIC PANEL - Abnormal; Notable for the following:    Sodium 134 (*)    CO2 19 (*)    Glucose, Bld 341 (*)    BUN 36 (*)    Creatinine, Ser 1.64 (*)    GFR calc non Af Amer 32 (*)    GFR calc Af Amer 37 (*)    All other components within normal limits  URINALYSIS COMPLETEWITH MICROSCOPIC ([facility] ONLY) - Abnormal; Notable for the following:    Color, Urine STRAW (*)    APPearance CLEAR (*)    Glucose, UA >500 (*)    Leukocytes, UA 2+ (*)    Bacteria, UA RARE (*)    Squamous Epithelial / LPF 0-5 (*)    All other components within normal limits  LIPASE, BLOOD  TROPONIN I    ____________________________________________  EKG  ED ECG REPORT I, Doran Stabler, the attending physician, personally viewed and interpreted this ECG.   Date: 04/30/2016  EKG Time: 0652  Rate: 1:15  Rhythm:  sinus tachycardia  Axis: Normal axis  Intervals:none  ST&T Change: No ST segment elevation or depression. No abnormal T-wave inversion.  ____________________________________________  RADIOLOGY   ____________________________________________   PROCEDURES  Procedure(s) performed:   Procedures  Critical Care performed:   ____________________________________________   INITIAL IMPRESSION / ASSESSMENT AND PLAN / ED COURSE  Pertinent labs & imaging results that were available during my care of the patient were reviewed by me and considered in my medical decision making (see chart for details).   Clinical Course     ----------------------------------------- 12:22 PM on 04/30/2016 -----------------------------------------  Patient is resting comfortably at this time and has been able to tolerate by mouth fluids. When resting the patient's heart rate is 83 bpm in the room. She says that she has had multiple previous episodes and has Zofran as well as Reglan at home. Will be following up with Dr. Doy Hutching. Appears to have possible mild UTI. We'll discharge home with Keflex. I did her about her lab results which also included a moderate elevation in her creatinine which may be consistent with dehydration. She is understanding of her discharge and wanted to comply. ____________________________________________   FINAL CLINICAL IMPRESSION(S) / ED DIAGNOSES  Dehydration. Nausea. Tachycardia. UTI.    NEW MEDICATIONS STARTED DURING THIS VISIT:  New Prescriptions   No medications on file     Note:  This document was prepared using Dragon voice recognition software and may include unintentional dictation errors.    Orbie Pyo, MD 04/30/16  [phone]

## 2016-04-30 NOTE — ED Triage Notes (Signed)
Pt in with co nausea, high bp, co headache, no vomiting.

## 2016-05-05 ENCOUNTER — Emergency Department: Payer: Medicare Other

## 2016-05-05 ENCOUNTER — Encounter: Payer: Self-pay | Admitting: Emergency Medicine

## 2016-05-05 ENCOUNTER — Emergency Department
Admission: EM | Admit: 2016-05-05 | Discharge: 2016-05-05 | Disposition: A | Discharge status: Home / Self Care | Payer: Medicare Other | Attending: Student in an Organized Health Care Education/Training Program | Admitting: Student in an Organized Health Care Education/Training Program

## 2016-05-05 DIAGNOSIS — R51 Headache: Secondary | ICD-10-CM | POA: Diagnosis not present

## 2016-05-05 DIAGNOSIS — N183 Chronic kidney disease, stage 3 (moderate): Secondary | ICD-10-CM | POA: Insufficient documentation

## 2016-05-05 DIAGNOSIS — Z79899 Other long term (current) drug therapy: Secondary | ICD-10-CM | POA: Diagnosis not present

## 2016-05-05 DIAGNOSIS — Z794 Long term (current) use of insulin: Secondary | ICD-10-CM | POA: Diagnosis not present

## 2016-05-05 DIAGNOSIS — R1013 Epigastric pain: Secondary | ICD-10-CM | POA: Diagnosis not present

## 2016-05-05 DIAGNOSIS — F419 Anxiety disorder, unspecified: Secondary | ICD-10-CM | POA: Insufficient documentation

## 2016-05-05 DIAGNOSIS — I471 Supraventricular tachycardia: Secondary | ICD-10-CM | POA: Insufficient documentation

## 2016-05-05 DIAGNOSIS — I129 Hypertensive chronic kidney disease with stage 1 through stage 4 chronic kidney disease, or unspecified chronic kidney disease: Secondary | ICD-10-CM | POA: Diagnosis not present

## 2016-05-05 DIAGNOSIS — R110 Nausea: Secondary | ICD-10-CM | POA: Diagnosis present

## 2016-05-05 DIAGNOSIS — E1122 Type 2 diabetes mellitus with diabetic chronic kidney disease: Secondary | ICD-10-CM | POA: Diagnosis not present

## 2016-05-05 DIAGNOSIS — R630 Anorexia: Secondary | ICD-10-CM | POA: Insufficient documentation

## 2016-05-05 DIAGNOSIS — I2510 Atherosclerotic heart disease of native coronary artery without angina pectoris: Secondary | ICD-10-CM | POA: Diagnosis not present

## 2016-05-05 DIAGNOSIS — Z8679 Personal history of other diseases of the circulatory system: Secondary | ICD-10-CM | POA: Insufficient documentation

## 2016-05-05 LAB — CBC
HEMATOCRIT: 32.9 % — AB (ref 35.0–47.0)
HEMOGLOBIN: 11.3 g/dL — AB (ref 12.0–16.0)
MCH: 31.2 pg (ref 26.0–34.0)
MCHC: 34.3 g/dL (ref 32.0–36.0)
MCV: 91.1 fL (ref 80.0–100.0)
Platelets: 209 10*3/uL (ref 150–440)
RBC: 3.61 MIL/uL — ABNORMAL LOW (ref 3.80–5.20)
RDW: 14.8 % — AB (ref 11.5–14.5)
WBC: 6.3 10*3/uL (ref 3.6–11.0)

## 2016-05-05 LAB — COMPREHENSIVE METABOLIC PANEL
ALT: 21 U/L (ref 14–54)
ANION GAP: 9 (ref 5–15)
AST: 21 U/L (ref 15–41)
Albumin: 4.4 g/dL (ref 3.5–5.0)
Alkaline Phosphatase: 87 U/L (ref 38–126)
BILIRUBIN TOTAL: 1.1 mg/dL (ref 0.3–1.2)
BUN: 29 mg/dL — AB (ref 6–20)
CO2: 21 mmol/L — ABNORMAL LOW (ref 22–32)
Calcium: 9.4 mg/dL (ref 8.9–10.3)
Chloride: 104 mmol/L (ref 101–111)
Creatinine, Ser: 1.61 mg/dL — ABNORMAL HIGH (ref 0.44–1.00)
GFR calc Af Amer: 37 mL/min — ABNORMAL LOW (ref >60)
GFR, EST NON AFRICAN AMERICAN: 32 mL/min — AB (ref >60)
Glucose, Bld: 341 mg/dL — ABNORMAL HIGH (ref 65–99)
POTASSIUM: 4.4 mmol/L (ref 3.5–5.1)
Sodium: 134 mmol/L — ABNORMAL LOW (ref 135–145)
TOTAL PROTEIN: 7.7 g/dL (ref 6.5–8.1)

## 2016-05-05 LAB — URINALYSIS, COMPLETE (UACMP) WITH MICROSCOPIC
BILIRUBIN URINE: NEGATIVE
HGB URINE DIPSTICK: NEGATIVE
Ketones, ur: NEGATIVE mg/dL
NITRITE: NEGATIVE
Protein, ur: NEGATIVE mg/dL
RBC / HPF: NONE SEEN RBC/hpf (ref 0–5)
SPECIFIC GRAVITY, URINE: 1.008 (ref 1.005–1.030)
Squamous Epithelial / LPF: NONE SEEN
pH: 5 (ref 5.0–8.0)

## 2016-05-05 LAB — LIPASE, BLOOD: Lipase: 28 U/L (ref 11–51)

## 2016-05-05 MED ORDER — LORAZEPAM 1 MG PO TABS
1.0000 mg | ORAL_TABLET | Freq: Once | ORAL | Status: AC
  Administered 2016-05-05: 1 mg via ORAL
  Filled 2016-05-05: qty 1

## 2016-05-05 MED ORDER — INSULIN ASPART 100 UNIT/ML ~~LOC~~ SOLN
SUBCUTANEOUS | Status: AC
  Administered 2016-05-05: 5 [IU] via INTRAVENOUS
  Filled 2016-05-05: qty 5

## 2016-05-05 MED ORDER — PROMETHAZINE HCL 25 MG PO TABS
12.5000 mg | ORAL_TABLET | Freq: Once | ORAL | Status: AC
  Administered 2016-05-05: 12.5 mg via ORAL

## 2016-05-05 MED ORDER — SODIUM CHLORIDE 0.9 % IV BOLUS (SEPSIS)
500.0000 mL | Freq: Once | INTRAVENOUS | Status: AC
  Administered 2016-05-05: 500 mL via INTRAVENOUS

## 2016-05-05 MED ORDER — GI COCKTAIL ~~LOC~~
30.0000 mL | Freq: Once | ORAL | Status: AC
  Administered 2016-05-05: 30 mL via ORAL
  Filled 2016-05-05: qty 30

## 2016-05-05 MED ORDER — PROMETHAZINE HCL 25 MG PO TABS
ORAL_TABLET | ORAL | Status: AC
  Administered 2016-05-05: 12.5 mg via ORAL
  Filled 2016-05-05: qty 1

## 2016-05-05 MED ORDER — LORAZEPAM 1 MG PO TABS
1.0000 mg | ORAL_TABLET | Freq: Once | ORAL | Status: DC
  Filled 2016-05-05: qty 1

## 2016-05-05 MED ORDER — DIAZEPAM 5 MG PO TABS: 5.0000 mg | ORAL_TABLET | Freq: Two times a day (BID) | ORAL | 0 refills | Status: DC | PRN

## 2016-05-05 MED ORDER — INSULIN ASPART 100 UNIT/ML ~~LOC~~ SOLN
5.0000 [IU] | Freq: Once | SUBCUTANEOUS | Status: AC
  Administered 2016-05-05: 5 [IU] via INTRAVENOUS
  Filled 2016-05-05: qty 0.05

## 2016-05-05 MED ORDER — RANITIDINE HCL 300 MG PO TABS: 150.0000 mg | ORAL_TABLET | Freq: Every day | ORAL | 0 refills | Status: DC

## 2016-05-05 NOTE — ED Triage Notes (Signed)
Pt presents with nausea and "fidgety, antsy" behavior per her husband. States that she has been unable to stay still and that she is hungry but can't eat. Pt states she has a headache, but just "feels bad." Pt alert & weepy during triage.

## 2016-05-05 NOTE — ED Notes (Signed)
Upon preforming discharge vitals HR was 108, Dr. Quentin Cornwall informed, per Dr. Quentin Cornwall pt placed on monitor and watched for HR below 100

## 2016-05-05 NOTE — ED Notes (Signed)
Pts husband states she has been anxious all day, states pacing all day and little sleep, states pt has been crying all day, in room pt anxiously walking back and forth, states "I just want to feel better", alert and oriented in no acute distress

## 2016-05-05 NOTE — ED Provider Notes (Signed)
[facility] Emergency Department Provider Note    First MD Initiated Contact with Patient 05/05/16 1434     (approximate)  I have reviewed the triage vital signs and the nursing notes.   HISTORY  Chief Complaint Nausea and Anxiety    HPI Dorothy Rogers is a 65 y.o. female very complex past medical history presents with a chief complaint of nausea and anorexia for the past 24 hours associated with headache and "feeling bad". Patient arrives with husband and does appear very anxious. Husband is concerned that she's having a panic attack. Denies any abdominal pain. No signs or vomiting. Does have a history of diabetes and just has not been eating according to the husband. No recent fevers. Has not moved her bowels in 4 days. She is still passing gas. No dysuria. Taste that she's been crying for most the day. She denies any SI or HI.   Past Medical History:  Diagnosis Date  . Anemia   . Anemia   . Arthritis    lower back, hips  . Blood transfusion without reported diagnosis   . CAD (coronary artery disease) FEB AND NOV 2009   6 STENTS  . 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 Cataract   . Chronic kidney disease    Stage III  . Coronary artery disease   . DDD (degenerative disc disease), lumbar   . Diabetes mellitus without complication ([place])    TYPE 2  . Herpes simplex   . Hyperlipidemia   . Hypertension    CONTROLLED ON MEDS  . Vertigo    occasional, no episodes in 2-3 months  . Vertigo    OCCASIONALLY  . Vitamin B 12 deficiency    Family History  Problem Relation Age of Onset  . Heart disease Father   . Diabetes Other   . Breast cancer Neg Hx    Past Surgical History:  Procedure Laterality Date  . [place] STUDY N/A 02/04/2016   Procedure: [place] STUDY;  Surgeon: Lucilla Lame, MD;  Location: [facility] ENDOSCOPY;  Service: Endoscopy;  Laterality: N/A;  . ABDOMINAL HYSTERECTOMY    . BACK SURGERY     Lumbar spinal  fusion 05/30/2015  . CARDIAC CATHETERIZATION  2009  . CATARACT EXTRACTION Left   . CATARACT EXTRACTION W/PHACO Right 03/19/2015   Procedure: CATARACT EXTRACTION PHACO AND INTRAOCULAR LENS PLACEMENT ([place]);  Surgeon: Leandrew Koyanagi, MD;  Location: [facility];  Service: Ophthalmology;  Laterality: Right;  DIABETIC - insulin and oral meds  . CORONARY STENT PLACEMENT  03/2008  . CYSTECTOMY Right    wrist  . ESOPHAGEAL MANOMETRY N/A 02/04/2016   Procedure: ESOPHAGEAL MANOMETRY (EM);  Surgeon: Lucilla Lame, MD;  Location: [facility] ENDOSCOPY;  Service: Endoscopy;  Laterality: N/A;  . ESOPHAGOGASTRODUODENOSCOPY (EGD) WITH PROPOFOL N/A 09/04/2015   Procedure: ESOPHAGOGASTRODUODENOSCOPY (EGD) WITH PROPOFOL;  Surgeon: Lucilla Lame, MD;  Location: [facility];  Service: Endoscopy;  Laterality: N/A;  INSULIN DEPENDENT DIABETIC  . EYE SURGERY Left    cataract extraction  . LUMBAR FUSION  2001, 2014, 2015  . [place]     2001, 2014, 2015  . TUMOR EXCISION     back of neck   Patient Active Problem List   Diagnosis Date Noted  . SOB (shortness of breath)   . Near syncope 04/07/2016  . 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 Non-intractable cyclical vomiting with nausea   . Congenital esophageal defect   .  Hiatal hernia   . Gastritis   . Diarrhea 07/10/2015  . Diabetes mellitus ([place]) 07/02/2015  . Adjustment disorder 06/16/2015  . Abdominal pain 06/16/2015  . C. difficile colitis 06/16/2015  . Intractable nausea and vomiting 06/09/2015  . Dehydration 06/09/2015  . Type 2 diabetes mellitus ([place]) 06/09/2015  . HLD (hyperlipidemia) 06/09/2015  . HTN (hypertension) 06/09/2015  . CAD (coronary artery disease) 06/09/2015  . CKD (chronic kidney disease), stage III 06/09/2015  . Thrombocytopenia ([place]) 05/30/2015  . Lumbar canal stenosis 05/30/2015  . Disc disease with myelopathy, lumbar 05/30/2015  . Presence of stent in coronary artery 05/30/2015  . Sacroiliac joint dysfunction  02/10/2015  . Status post lumbar spinal fusion 12/25/2014  . DDD (degenerative disc disease), lumbar 11/18/2014  . Lumbar post-laminectomy syndrome 11/18/2014  . Facet syndrome, lumbar 11/18/2014  . Piriformis syndrome of left side 11/18/2014  . Status post lumbar spine operation 05/09/2014  . Adiposity 04/23/2014  . Thoracic and lumbosacral neuritis 03/27/2013  . H/O arthrodesis 03/27/2013  . Peripheral vascular disease ([place]) 03/22/2013  . Hypomagnesemia 03/22/2013  . Absolute anemia 03/22/2013      Prior to Admission medications   Medication Sig Start Date End Date Taking? Authorizing Provider  acyclovir (ZOVIRAX) 400 MG tablet Take 400 mg by mouth as needed.  08/07/15  Yes Historical Provider, MD  aspirin EC 81 MG tablet Take 81 mg by mouth at bedtime.   Yes Historical Provider, MD  atorvastatin (LIPITOR) 40 MG tablet Take 40 mg by mouth at bedtime.  08/16/14  Yes Historical Provider, MD  calcium-vitamin D (OSCAL WITH D) 500-200 MG-UNIT tablet Take 1 tablet by mouth daily with breakfast.   Yes Historical Provider, MD  clopidogrel (PLAVIX) 75 MG tablet Take 1 tablet (75 mg total) by mouth daily. 07/11/15  Yes Bettey Costa, MD  ferrous sulfate 325 (65 FE) MG tablet Take 325 mg by mouth daily.   Yes Historical Provider, MD  glipiZIDE (GLUCOTROL) 10 MG tablet Take 10 mg by mouth 2 (two) times daily.   Yes Historical Provider, MD  insulin lispro (HUMALOG) 100 UNIT/ML injection Inject 2-6 Units into the skin 3 (three) times daily with meals.    Yes Historical Provider, MD  losartan (COZAAR) 100 MG tablet Take 100 mg by mouth every morning. AM 02/05/14  Yes Historical Provider, MD  metoCLOPramide (REGLAN) 10 MG tablet Take 1 tablet (10 mg total) by mouth every 8 (eight) hours as needed for nausea. 03/28/16 03/28/17 Yes Nance Pear, MD  ondansetron (ZOFRAN ODT) 8 MG disintegrating tablet Take 8 mg by mouth as needed for nausea or vomiting.   Yes Historical Provider, MD  VICTOZA 18 MG/3ML SOPN   04/29/16  Yes Historical Provider, MD  vitamin B-12 (CYANOCOBALAMIN) 1000 MCG tablet Take 1,000 mcg by mouth daily.   Yes Historical Provider, MD  amLODipine (NORVASC) 5 MG tablet Take 5 mg by mouth every morning. AM 08/29/14   Historical Provider, MD  cephALEXin (KEFLEX) 500 MG capsule Take 1 capsule (500 mg total) by mouth 3 (three) times daily. 04/30/16 05/10/16  Orbie Pyo, MD  diazepam (VALIUM) 5 MG tablet Take 1 tablet (5 mg total) by mouth every 12 (twelve) hours as needed for anxiety or sedation. 05/05/16 05/05/17  Merlyn Lot, MD  levothyroxine (SYNTHROID, LEVOTHROID) 25 MCG tablet Take 1 tablet (25 mcg total) by mouth daily before breakfast. Patient not taking: Reported on 05/05/2016 03/28/16   Nance Pear, MD  oxyCODONE (ROXICODONE) 5 MG/5ML solution Take 5 mLs (5 mg  total) by mouth every 4 (four) hours as needed for moderate pain or severe pain. Patient not taking: Reported on 04/30/2016 03/14/16   Clayburn Pert, MD  ranitidine (ZANTAC) 300 MG tablet Take 0.5 tablets (150 mg total) by mouth at bedtime. 05/05/16 05/05/17  Merlyn Lot, MD    Allergies Tramadol; Beta adrenergic blockers; Celebrex [celecoxib]; Niacin; Niacin and related; Oxycodone; Shellfish allergy; Vicodin [hydrocodone-acetaminophen]; and Heparin    Social History Social History  Substance Use Topics  . Smoking status: Never Smoker  . Smokeless tobacco: Never Used  . Alcohol use 0.0 oz/week     Comment: 2-3 drinks per year    Review of Systems Patient denies headaches, rhinorrhea, blurry vision, numbness, shortness of breath, chest pain, edema, cough, abdominal pain, nausea, vomiting, diarrhea, dysuria, fevers, rashes or hallucinations unless otherwise stated above in HPI. ____________________________________________   PHYSICAL EXAM:  VITAL SIGNS: Vitals:   05/05/16 1725 05/05/16 1807  BP: 114/67   Pulse: (!) 108 99  Resp: 18   Temp:      Constitutional: Alert and oriented.  Very anxious appearing, pacing in ER room, tearful Eyes: Conjunctivae are normal. PERRL. EOMI. Head: Atraumatic. Nose: No congestion/rhinnorhea. Mouth/Throat: Mucous membranes are moist.  Oropharynx non-erythematous. Neck: No stridor. Painless ROM. No cervical spine tenderness to palpation Hematological/Lymphatic/Immunilogical: No cervical lymphadenopathy. Cardiovascular: Normal rate, regular rhythm. Grossly normal heart sounds.  Good peripheral circulation. Respiratory: Normal respiratory effort.  No retractions. Lungs CTAB. Gastrointestinal: Soft and nontender. No distention. No abdominal bruits. No CVA tenderness. Musculoskeletal: No lower extremity tenderness nor edema.  No joint effusions. Neurologic:  Normal speech and language. No gross focal neurologic deficits are appreciated. No gait instability. Skin:  Skin is warm, dry and intact. No rash noted. Psychiatric: anxious and tearful, appears withdrawan  ____________________________________________   LABS (all labs ordered are listed, but only abnormal results are displayed)  Results for orders placed or performed during the hospital encounter of 05/05/16 (from the past 24 hour(s))  Lipase, blood     Status: None   Collection Time: 05/05/16  1:49 PM  Result Value Ref Range   Lipase 28 11 - 51 U/L  Comprehensive metabolic panel     Status: Abnormal   Collection Time: 05/05/16  1:49 PM  Result Value Ref Range   Sodium 134 (L) 135 - 145 mmol/L   Potassium 4.4 3.5 - 5.1 mmol/L   Chloride 104 101 - 111 mmol/L   CO2 21 (L) 22 - 32 mmol/L   Glucose, Bld 341 (H) 65 - 99 mg/dL   BUN 29 (H) 6 - 20 mg/dL   Creatinine, Ser 1.61 (H) 0.44 - 1.00 mg/dL   Calcium 9.4 8.9 - 10.3 mg/dL   Total Protein 7.7 6.5 - 8.1 g/dL   Albumin 4.4 3.5 - 5.0 g/dL   AST 21 15 - 41 U/L   ALT 21 14 - 54 U/L   Alkaline Phosphatase 87 38 - 126 U/L   Total Bilirubin 1.1 0.3 - 1.2 mg/dL   GFR calc non Af Amer 32 (L) >60 mL/min   GFR calc Af Amer 37 (L) >60  mL/min   Anion gap 9 5 - 15  CBC     Status: Abnormal   Collection Time: 05/05/16  1:49 PM  Result Value Ref Range   WBC 6.3 3.6 - 11.0 K/uL   RBC 3.61 (L) 3.80 - 5.20 MIL/uL   Hemoglobin 11.3 (L) 12.0 - 16.0 g/dL   HCT 32.9 (L) 35.0 - 47.0 %  MCV 91.1 80.0 - 100.0 fL   MCH 31.2 26.0 - 34.0 pg   MCHC 34.3 32.0 - 36.0 g/dL   RDW 14.8 (H) 11.5 - 14.5 %   Platelets 209 150 - 440 K/uL  Urinalysis, Complete w Microscopic     Status: Abnormal   Collection Time: 05/05/16  1:49 PM  Result Value Ref Range   Color, Urine STRAW (A) YELLOW   APPearance CLEAR (A) CLEAR   Specific Gravity, Urine 1.008 1.005 - 1.030   pH 5.0 5.0 - 8.0   Glucose, UA >=500 (A) NEGATIVE mg/dL   Hgb urine dipstick NEGATIVE NEGATIVE   Bilirubin Urine NEGATIVE NEGATIVE   Ketones, ur NEGATIVE NEGATIVE mg/dL   Protein, ur NEGATIVE NEGATIVE mg/dL   Nitrite NEGATIVE NEGATIVE   Leukocytes, UA TRACE (A) NEGATIVE   RBC / HPF NONE SEEN 0 - 5 RBC/hpf   WBC, UA 0-5 0 - 5 WBC/hpf   Bacteria, UA RARE (A) NONE SEEN   Squamous Epithelial / LPF NONE SEEN NONE SEEN   Mucous PRESENT    ____________________________________________  EKG My review and personal interpretation at Time: 13:47   Indication: nausea  Rate: 115  Rhythm: sinus Axis: normal Other: non specific st changes ____________________________________________  RADIOLOGY  I personally reviewed all radiographic images ordered to evaluate for the above acute complaints and reviewed radiology reports and findings.  These findings were personally discussed with the patient.  Please see medical record for radiology report.  ____________________________________________   PROCEDURES  Procedure(s) performed: none Procedures    Critical Care performed: no ____________________________________________   INITIAL IMPRESSION / ASSESSMENT AND PLAN / ED COURSE  Pertinent labs & imaging results that were available during my care of the patient were reviewed by me  and considered in my medical decision making (see chart for details).  DDX: diverticulitis, gastritis, obstruction, abscess, anxiety, panic attack, depression  Dorothy Rogers is a 65 y.o. who presents to the ED with above complaints. Patient very anxious appearing. Hemodynamically stable well-perfused. Abdominal exam is reassuring but based on her age and recent abdominal surgery will order CT imaging to evaluate for any acute abnormality. Blood work is suggestive of hyperglycemia with mild dehydration without any evidence of DKA. Patient is very anxious appearing and I do feel there is a large component of psychiatric illness contributing to her presentation. We'll provide anxiolysis and reassess.  The patient will be placed on continuous pulse oximetry and telemetry for monitoring.  Laboratory evaluation will be sent to evaluate for the above complaints.     Clinical Course as of May 05 1913  Wed May 05, 2016  1602 Patient ambulating and appears much improved.  [PR]  1713 Patient sleeping comfortably at this time.  Patient was able to tolerate PO and was able to ambulate with a steady gait.  CT imaging reassuring.  Will start antacid for area of inflammation around hiatal hernia.  I do feel large component of this is anxiety likely exacerbated by lack of sleep last night. Patient reiterates that she does not have any suicidal ideation or homicidal ideation. She is not hallucinating. I discussed my concerns with the husband who states that she is able to closely follow up with PCP.  Have discussed with the patient and available family all diagnostics and treatments performed thus far and all questions were answered to the best of my ability. The patient demonstrates understanding and agreement with plan.    [PR]    Clinical Course User Index [  PR] Merlyn Lot, MD     ____________________________________________   FINAL CLINICAL IMPRESSION(S) / ED DIAGNOSES  Final diagnoses:  Abdominal  discomfort, epigastric      NEW MEDICATIONS STARTED DURING THIS VISIT:  Discharge Medication List as of 05/05/2016  5:20 PM    START taking these medications   Details  diazepam (VALIUM) 5 MG tablet Take 1 tablet (5 mg total) by mouth every 12 (twelve) hours as needed for anxiety or sedation., Starting Wed 05/05/2016, Until Thu 05/05/2017, Print    ranitidine (ZANTAC) 300 MG tablet Take 0.5 tablets (150 mg total) by mouth at bedtime., Starting Wed 05/05/2016, Until Thu 05/05/2017, Print         Note:  This document was prepared using Dragon voice recognition software and may include unintentional dictation errors.    Merlyn Lot, MD 05/05/16 [phone]

## 2016-05-07 ENCOUNTER — Telehealth: Payer: Self-pay

## 2016-05-07 DIAGNOSIS — R935 Abnormal findings on diagnostic imaging of other abdominal regions, including retroperitoneum: Secondary | ICD-10-CM

## 2016-05-07 MED ORDER — PANTOPRAZOLE SODIUM 40 MG PO TBEC: 40.0000 mg | DELAYED_RELEASE_TABLET | Freq: Every day | ORAL | 0 refills | Status: DC

## 2016-05-07 NOTE — Telephone Encounter (Signed)
Message sent to Dr. Adonis Huguenin at this time. Awaiting orders at this time.

## 2016-05-07 NOTE — Telephone Encounter (Signed)
Orders from Dr. Adonis Huguenin are: Obtain Barium Swallow Study, restart PPI, and have patient seen this coming week by Dr. Dahlia Byes then the following week by Dr. Adonis Huguenin.  Testing ordered. Swallow Study scheduled for 05/13/16 at 10:15am arrival at [facility]. Patient is to be NPO after MN prior to testing.  She will follow-up next week with Dr. Dahlia Byes on 05/14/16  and then see Dr. Adonis Huguenin the following week on 05/19/16 to discuss plan.  Patient is not currently taking Ranitidine at HS. Start Pantoprazole 40mg  Daily. Medication sent to [place] at this time.

## 2016-05-07 NOTE — Telephone Encounter (Signed)
[name] is calling in behalf of Dorothy Rogers. She was seen in the ED on 05/05/16 and was instructed to contact Dr. Adonis Huguenin to discuss the CT results. [name] is in the patients FYI, It is okay to talk to him in regards to the patient. Please call patient and advice

## 2016-05-09 ENCOUNTER — Encounter: Payer: Self-pay | Admitting: Emergency Medicine

## 2016-05-09 ENCOUNTER — Emergency Department
Admission: EM | Admit: 2016-05-09 | Discharge: 2016-05-09 | Disposition: A | Discharge status: Home / Self Care | Payer: Medicare Other | Attending: Emergency Medicine | Admitting: Emergency Medicine

## 2016-05-09 DIAGNOSIS — F410 Panic disorder [episodic paroxysmal anxiety] without agoraphobia: Secondary | ICD-10-CM | POA: Diagnosis not present

## 2016-05-09 DIAGNOSIS — N183 Chronic kidney disease, stage 3 (moderate): Secondary | ICD-10-CM | POA: Diagnosis not present

## 2016-05-09 DIAGNOSIS — Z7982 Long term (current) use of aspirin: Secondary | ICD-10-CM | POA: Insufficient documentation

## 2016-05-09 DIAGNOSIS — E1122 Type 2 diabetes mellitus with diabetic chronic kidney disease: Secondary | ICD-10-CM | POA: Diagnosis not present

## 2016-05-09 DIAGNOSIS — Z794 Long term (current) use of insulin: Secondary | ICD-10-CM | POA: Diagnosis not present

## 2016-05-09 DIAGNOSIS — Z79899 Other long term (current) drug therapy: Secondary | ICD-10-CM | POA: Diagnosis not present

## 2016-05-09 DIAGNOSIS — I2510 Atherosclerotic heart disease of native coronary artery without angina pectoris: Secondary | ICD-10-CM | POA: Insufficient documentation

## 2016-05-09 DIAGNOSIS — I129 Hypertensive chronic kidney disease with stage 1 through stage 4 chronic kidney disease, or unspecified chronic kidney disease: Secondary | ICD-10-CM | POA: Diagnosis not present

## 2016-05-09 DIAGNOSIS — F419 Anxiety disorder, unspecified: Secondary | ICD-10-CM | POA: Diagnosis present

## 2016-05-09 MED ORDER — LORAZEPAM 1 MG PO TABS: 1.0000 mg | ORAL_TABLET | Freq: Four times a day (QID) | ORAL | 0 refills | Status: AC | PRN

## 2016-05-09 MED ORDER — ACETAMINOPHEN 500 MG PO TABS
1000.0000 mg | ORAL_TABLET | Freq: Once | ORAL | Status: AC
  Administered 2016-05-09: 1000 mg via ORAL
  Filled 2016-05-09: qty 2

## 2016-05-09 MED ORDER — LORAZEPAM 1 MG PO TABS
1.0000 mg | ORAL_TABLET | Freq: Once | ORAL | Status: AC
  Administered 2016-05-09: 1 mg via ORAL
  Filled 2016-05-09: qty 1

## 2016-05-09 MED ORDER — GI COCKTAIL ~~LOC~~
30.0000 mL | Freq: Once | ORAL | Status: AC
  Administered 2016-05-09: 30 mL via ORAL
  Filled 2016-05-09: qty 30

## 2016-05-09 NOTE — ED Triage Notes (Signed)
Seen through ED on Wednesday for an anxiety attack.  Symptoms improved, but symptoms returned today.  Valium and zofran given today with small effect.  Patient remains tearful.

## 2016-05-09 NOTE — Discharge Instructions (Signed)
You may take Ativan as needed for panic attacks. Please make an appointment with Dr. Doy Hutching tomorrow, to discuss a long-term management plan for anxiety.  Turn to the emergency department for severe pain, lightheadedness or fainting, panic attacks that do not resolve with Ativan, thoughts of hurting herself or anyone else, hallucinations, or any other symptoms concerning to you.

## 2016-05-09 NOTE — ED Provider Notes (Signed)
[facility] Emergency Department Provider Note  ____________________________________________  Time seen: Approximately 5:44 PM  I have reviewed the triage vital signs and the nursing notes.   HISTORY  Chief Complaint Anxiety    HPI Dorothy Rogers is a 65 y.o. female with multiple chronic medical problems, recent lower abdominal hernia surgery, brought by her husband for panic attack.  The patient and her husband give a joint history.  It is reported that she underwent surgery 4 wks ago, and has been under sign stress.  She describes multiple episodes, including one last week, when she develops anxiety, has to pace around the room, cannot sit still and an "emtpy feeling" in the lower abdomen.  Last week she was treated with a GI cocktail and Ativan successfully, and was discharged home with Valium. The patient has been having similar symptoms since last evening and her husband gave her Valium at 10 AM that did not improve her symptoms. She has not had any thoughts of hurting herself or anyone else, no hallucinations, no nausea vomiting or diarrhea, no fever.  The patient does not have a history of anxiety. She has recently had a full thyroid panel checked in 10/17 by her PMD which did not show any abnormality. When the patient was seen here 05/05/2016, a CT abdomen was performed which showed some inflammatory changes but no acute abnormalities.   Past Medical History:  Diagnosis Date  . Anemia   . Anemia   . Arthritis    lower back, hips  . Blood transfusion without reported diagnosis   . CAD (coronary artery disease) FEB AND NOV 2009   6 STENTS  . 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 Cataract   . Chronic kidney disease    Stage III  . Coronary artery disease   . DDD (degenerative disc disease), lumbar   . Diabetes mellitus without complication ([place])    TYPE 2  . Herpes simplex   . Hyperlipidemia   . Hypertension    CONTROLLED ON  MEDS  . Vertigo    occasional, no episodes in 2-3 months  . Vertigo    OCCASIONALLY  . Vitamin B 12 deficiency     Patient Active Problem List   Diagnosis Date Noted  . Supraventricular tachycardia ([place]) 05/05/2016  . SOB (shortness of breath)   . Near syncope 04/07/2016  . 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 Non-intractable cyclical vomiting with nausea   . Congenital esophageal defect   . Hiatal hernia   . Gastritis   . Diarrhea 07/10/2015  . Diabetes mellitus ([place]) 07/02/2015  . Adjustment disorder 06/16/2015  . Abdominal pain 06/16/2015  . C. difficile colitis 06/16/2015  . Intractable nausea and vomiting 06/09/2015  . Dehydration 06/09/2015  . Type 2 diabetes mellitus ([place]) 06/09/2015  . HLD (hyperlipidemia) 06/09/2015  . HTN (hypertension) 06/09/2015  . CAD (coronary artery disease) 06/09/2015  . CKD (chronic kidney disease), stage III 06/09/2015  . Thrombocytopenia ([place]) 05/30/2015  . Lumbar canal stenosis 05/30/2015  . Disc disease with myelopathy, lumbar 05/30/2015  . Presence of stent in coronary artery 05/30/2015  . 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 Status post lumbar spinal fusion 12/25/2014  . DDD (degenerative disc disease), lumbar 11/18/2014  . Lumbar post-laminectomy syndrome 11/18/2014  . Facet syndrome, lumbar 11/18/2014  . Piriformis syndrome of left side 11/18/2014  . Status post lumbar spine operation 05/09/2014  . Adiposity 04/23/2014  . Thoracic and lumbosacral neuritis 03/27/2013  .  H/O arthrodesis 03/27/2013  . Peripheral vascular disease ([place]) 03/22/2013  . Hypomagnesemia 03/22/2013  . Absolute anemia 03/22/2013    Past Surgical History:  Procedure Laterality Date  . [place] STUDY N/A 02/04/2016   Procedure: [place] STUDY;  Surgeon: Lucilla Lame, MD;  Location: [facility] ENDOSCOPY;  Service: Endoscopy;  Laterality: N/A;  . ABDOMINAL HYSTERECTOMY    . BACK SURGERY     Lumbar spinal fusion 05/30/2015  . CARDIAC CATHETERIZATION   2009  . CATARACT EXTRACTION Left   . CATARACT EXTRACTION W/PHACO Right 03/19/2015   Procedure: CATARACT EXTRACTION PHACO AND INTRAOCULAR LENS PLACEMENT ([place]);  Surgeon: Leandrew Koyanagi, MD;  Location: [place];  Service: Ophthalmology;  Laterality: Right;  DIABETIC - insulin and oral meds  . CORONARY STENT PLACEMENT  03/2008  . CYSTECTOMY Right    wrist  . ESOPHAGEAL MANOMETRY N/A 02/04/2016   Procedure: ESOPHAGEAL MANOMETRY (EM);  Surgeon: Lucilla Lame, MD;  Location: [facility] ENDOSCOPY;  Service: Endoscopy;  Laterality: N/A;  . ESOPHAGOGASTRODUODENOSCOPY (EGD) WITH PROPOFOL N/A 09/04/2015   Procedure: ESOPHAGOGASTRODUODENOSCOPY (EGD) WITH PROPOFOL;  Surgeon: Lucilla Lame, MD;  Location: [facility];  Service: Endoscopy;  Laterality: N/A;  INSULIN DEPENDENT DIABETIC  . EYE SURGERY Left    cataract extraction  . LUMBAR FUSION  2001, 2014, 2015  . [place]     2001, 2014, 2015  . TUMOR EXCISION     back of neck    Current Outpatient Rx  . Order #: [id number] Class: Historical Med  . Order #: [id number] Class: Historical Med  . Order #: [id number] Class: Historical Med  . Order #: [id number] Class: Historical Med  . Order #: [id number] Class: Historical Med  . Order #: [id number] Class: Print  . Order #: [id number] Class: Historical Med  . Order #: [id number] Class: Normal  . Order #: [id number] Class: Historical Med  . Order #: [id number] Class: Historical Med  . Order #: [id number] Class: Historical Med  . Order #: [id number] Class: Historical Med  . Order #: [id number] Class: Print  . Order #: [id number] Class: Print  . Order #: [id number] Class: Historical Med  . Order #: [id number] Class: Print  . Order #: [id number] Class: Historical Med  . Order #: [id number] Class: Historical Med  . Order #: [id number] Class: Print  . Order #: [id number] Class: Normal  . Order #: [id number] Class: Print  . Order #: [id number] Class: Historical Med  . Order #: [id number] Class: Historical Med     Allergies Tramadol; Beta adrenergic blockers; Celebrex [celecoxib]; Niacin; Niacin and related; Oxycodone; Shellfish allergy; Vicodin [hydrocodone-acetaminophen]; and Heparin  Family History  Problem Relation Age of Onset  . Heart disease Father   . Diabetes Other   . Breast cancer Neg Hx     Social History Social History  Substance Use Topics  . Smoking status: Never Smoker  . Smokeless tobacco: Never Used  . Alcohol use 0.0 oz/week     Comment: 2-3 drinks per year    Review of Systems Constitutional: No fever/chills.Positive generalized malaise. Eyes: No visual changes. ENT: No sore throat. No congestion or rhinorrhea. Cardiovascular: Denies chest pain. Denies palpitations. Respiratory: Denies shortness of breath.  No cough. Gastrointestinal: No abdominal pain.  No nausea, no vomiting.  No diarrhea.  No constipation. Positive "empty" sensation in the lower abdomen. Positive decreased appetite. Genitourinary: Negative for dysuria. Musculoskeletal: Negative for back pain. Skin: Negative for rash. Neurological: Negative for headaches. No focal numbness, tingling or weakness.  Psychiatric:Positive anxiety, panic attack, inability to sit still. No SI, HI or hallucinations.  10-point ROS otherwise negative.  ____________________________________________  PHYSICAL EXAM:  VITAL SIGNS: ED Triage Vitals  Enc Vitals Group     BP 05/09/16 1533 (!) 152/74     Pulse Rate 05/09/16 1533 (!) 110     Resp 05/09/16 1533 20     Temp 05/09/16 1533 98.4 F (36.9 C)     Temp Source 05/09/16 1533 Oral     SpO2 05/09/16 1533 100 %     Weight 05/09/16 1532 173 lb (78.5 kg)     Height 05/09/16 1532 5\' 2"  (1.575 m)     Head Circumference --      Peak Flow --      Pain Score 05/09/16 1532 0     Pain Loc --      Pain Edu? --      Excl. in [place]? --     Constitutional: Alert, oriented and answering some questions. Does not make good eye contact but has clear speech. Able to follow  commands. Intermittently fidgety and anxious appearing. Eyes: Conjunctivae are normal.  EOMI. No scleral icterus. Head: Atraumatic. Nose: No congestion/rhinnorhea. Mouth/Throat: Mucous membranes are moist.  Neck: No stridor.  Supple.   Cardiovascular: Normal rate, regular rhythm. No murmurs, rubs or gallops.  Respiratory: Normal respiratory effort.  No accessory muscle use or retractions. Lungs CTAB.  No wheezes, rales or ronchi. Gastrointestinal: Obese. Soft, nontender and nondistended.  No guarding or rebound.  No peritoneal signs. Musculoskeletal: No LE edema.  Neurologic:  A&Ox3.  Speech is clear.  Face and smile are symmetric.  EOMI.  Moves all extremities well. Skin:  Skin is warm, dry and intact. No rash noted. Psychiatric: Mood and affect are normal. Speech and behavior are normal.  Normal judgement  ____________________________________________   LABS (all labs ordered are listed, but only abnormal results are displayed)  Labs Reviewed - No data to display ____________________________________________  EKG  Not indicated ____________________________________________  RADIOLOGY  No results found.  ____________________________________________   PROCEDURES  Procedure(s) performed: None  Procedures  Critical Care performed: No ____________________________________________   INITIAL IMPRESSION / ASSESSMENT AND PLAN / ED COURSE  Pertinent labs & imaging results that were available during my care of the patient were reviewed by me and considered in my medical decision making (see chart for details).  65 y.o. female with multiple situational panic attacks presenting with similar symptoms. Overall, the patient has reassuring vital signs with a heart rate of 85 area did she does have some anxiety in my examination but no other acute cardiopulmonary abnormalities. She has recently had her thyroid checked. Her empty abdominal feeling is nonspecific, and she has a reassuring  exam and a recent CT that does not show any acute findings. I had a long conversation with the patient about making a long-term management plan with her primary care physician or anxiety. I will give her Ativan here and a prescription for several tablets to go home with until she is able to see Dr. Doy Hutching. She understands return precautions as well as follow-up instructions.  ____________________________________________  FINAL CLINICAL IMPRESSION(S) / ED DIAGNOSES  Final diagnoses:  Panic attack    Clinical Course       NEW MEDICATIONS STARTED DURING THIS VISIT:  New Prescriptions   LORAZEPAM (ATIVAN) 1 MG TABLET    Take 1 tablet (1 mg total) by mouth every 6 (six) hours as needed for anxiety.      Eula Listen, MD 05/09/16 1756

## 2016-05-09 NOTE — ED Notes (Signed)
E signature pad not working 

## 2016-05-12 ENCOUNTER — Encounter: Payer: Self-pay | Admitting: Emergency Medicine

## 2016-05-12 ENCOUNTER — Emergency Department
Admission: EM | Admit: 2016-05-12 | Discharge: 2016-05-12 | Disposition: A | Discharge status: Home / Self Care | Payer: Medicare Other | Attending: Emergency Medicine | Admitting: Emergency Medicine

## 2016-05-12 DIAGNOSIS — E1122 Type 2 diabetes mellitus with diabetic chronic kidney disease: Secondary | ICD-10-CM | POA: Insufficient documentation

## 2016-05-12 DIAGNOSIS — R110 Nausea: Secondary | ICD-10-CM | POA: Diagnosis present

## 2016-05-12 DIAGNOSIS — I2510 Atherosclerotic heart disease of native coronary artery without angina pectoris: Secondary | ICD-10-CM | POA: Insufficient documentation

## 2016-05-12 DIAGNOSIS — Z794 Long term (current) use of insulin: Secondary | ICD-10-CM | POA: Diagnosis not present

## 2016-05-12 DIAGNOSIS — I129 Hypertensive chronic kidney disease with stage 1 through stage 4 chronic kidney disease, or unspecified chronic kidney disease: Secondary | ICD-10-CM | POA: Insufficient documentation

## 2016-05-12 DIAGNOSIS — Z7982 Long term (current) use of aspirin: Secondary | ICD-10-CM | POA: Insufficient documentation

## 2016-05-12 DIAGNOSIS — Z79899 Other long term (current) drug therapy: Secondary | ICD-10-CM | POA: Diagnosis not present

## 2016-05-12 DIAGNOSIS — N183 Chronic kidney disease, stage 3 (moderate): Secondary | ICD-10-CM | POA: Insufficient documentation

## 2016-05-12 LAB — COMPREHENSIVE METABOLIC PANEL
ALBUMIN: 4.2 g/dL (ref 3.5–5.0)
ALT: 23 U/L (ref 14–54)
AST: 21 U/L (ref 15–41)
Alkaline Phosphatase: 80 U/L (ref 38–126)
Anion gap: 7 (ref 5–15)
BILIRUBIN TOTAL: 1.5 mg/dL — AB (ref 0.3–1.2)
BUN: 26 mg/dL — AB (ref 6–20)
CHLORIDE: 108 mmol/L (ref 101–111)
CO2: 22 mmol/L (ref 22–32)
CREATININE: 1.68 mg/dL — AB (ref 0.44–1.00)
Calcium: 9 mg/dL (ref 8.9–10.3)
GFR calc Af Amer: 36 mL/min — ABNORMAL LOW (ref >60)
GFR calc non Af Amer: 31 mL/min — ABNORMAL LOW (ref >60)
GLUCOSE: 220 mg/dL — AB (ref 65–99)
POTASSIUM: 4.6 mmol/L (ref 3.5–5.1)
Sodium: 137 mmol/L (ref 135–145)
Total Protein: 7.3 g/dL (ref 6.5–8.1)

## 2016-05-12 LAB — CBC
HEMATOCRIT: 33 % — AB (ref 35.0–47.0)
Hemoglobin: 11.5 g/dL — ABNORMAL LOW (ref 12.0–16.0)
MCH: 31.3 pg (ref 26.0–34.0)
MCHC: 34.7 g/dL (ref 32.0–36.0)
MCV: 90 fL (ref 80.0–100.0)
Platelets: 219 10*3/uL (ref 150–440)
RBC: 3.67 MIL/uL — ABNORMAL LOW (ref 3.80–5.20)
RDW: 14.7 % — AB (ref 11.5–14.5)
WBC: 7.4 10*3/uL (ref 3.6–11.0)

## 2016-05-12 LAB — LIPASE, BLOOD: Lipase: 26 U/L (ref 11–51)

## 2016-05-12 LAB — TROPONIN I: Troponin I: <0.03 ng/mL (ref <0.03)

## 2016-05-12 MED ORDER — GI COCKTAIL ~~LOC~~
30.0000 mL | Freq: Once | ORAL | Status: AC
  Administered 2016-05-12: 30 mL via ORAL
  Filled 2016-05-12: qty 30

## 2016-05-12 MED ORDER — PROMETHAZINE HCL 25 MG/ML IJ SOLN
12.5000 mg | Freq: Once | INTRAMUSCULAR | Status: AC
  Administered 2016-05-12: 12.5 mg via INTRAVENOUS
  Filled 2016-05-12: qty 1

## 2016-05-12 MED ORDER — PROMETHAZINE HCL 12.5 MG PO TABS: 12.5000 mg | ORAL_TABLET | Freq: Four times a day (QID) | ORAL | 0 refills | Status: DC | PRN

## 2016-05-12 MED ORDER — ALUM & MAG HYDROXIDE-SIMETH 400-400-40 MG/5ML PO SUSP: 5.0000 mL | Freq: Four times a day (QID) | ORAL | 0 refills | Status: DC | PRN

## 2016-05-12 NOTE — ED Provider Notes (Signed)
[facility] Emergency Department Provider Note  ____________________________________________   I have reviewed the triage vital signs and the nursing notes.   HISTORY  Chief Complaint Weakness and Nausea    HPI Dorothy Rogers is a 65 y.o. female who had an early October reduction of hiatal hernia and since that time she's had recurrent epigastric abdominal pain. She is seen her surgeon for this. He has had multiple visits to her primary care in the emergency room for this. She had a CT scan, the results of which I have reviewed, please see notes. The patient has had no vomiting or fever but she has sensations of nausea which, and go and she has difficulty with early satiety. She denies abdominal pain. She has had multiple panic attacks recently but she does not feel like she is having a panic attack today. She does feel anxious however. She states usually Ativan and a GI cocktail medical of her symptoms go away.      Past Medical History:  Diagnosis Date  . Anemia   . Anemia   . Arthritis    lower back, hips  . Blood transfusion without reported diagnosis   . CAD (coronary artery disease) FEB AND NOV 2009   6 STENTS  . Cancer ([place])    tumor back of neck-fibroushistocytoma  . Cataract   . Chronic kidney disease    Stage III  . Coronary artery disease   . DDD (degenerative disc disease), lumbar   . Diabetes mellitus without complication ([place])    TYPE 2  . Herpes simplex   . Hyperlipidemia   . Hypertension    CONTROLLED ON MEDS  . Vertigo    occasional, no episodes in 2-3 months  . Vertigo    OCCASIONALLY  . Vitamin B 12 deficiency     Patient Active Problem List   Diagnosis Date Noted  . Supraventricular tachycardia ([place]) 05/05/2016  . SOB (shortness of breath)   . Near syncope 04/07/2016  . 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 Non-intractable cyclical vomiting with nausea   . Congenital esophageal defect   . Hiatal hernia   . Gastritis    . Diarrhea 07/10/2015  . Diabetes mellitus ([place]) 07/02/2015  . Adjustment disorder 06/16/2015  . Abdominal pain 06/16/2015  . C. difficile colitis 06/16/2015  . Intractable nausea and vomiting 06/09/2015  . Dehydration 06/09/2015  . Type 2 diabetes mellitus ([place]) 06/09/2015  . HLD (hyperlipidemia) 06/09/2015  . HTN (hypertension) 06/09/2015  . CAD (coronary artery disease) 06/09/2015  . CKD (chronic kidney disease), stage III 06/09/2015  . Thrombocytopenia ([place]) 05/30/2015  . Lumbar canal stenosis 05/30/2015  . Disc disease with myelopathy, lumbar 05/30/2015  . Presence of stent in coronary artery 05/30/2015  . 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 Status post lumbar spinal fusion 12/25/2014  . DDD (degenerative disc disease), lumbar 11/18/2014  . Lumbar post-laminectomy syndrome 11/18/2014  . Facet syndrome, lumbar 11/18/2014  . Piriformis syndrome of left side 11/18/2014  . Status post lumbar spine operation 05/09/2014  . Adiposity 04/23/2014  . Thoracic and lumbosacral neuritis 03/27/2013  . H/O arthrodesis 03/27/2013  . Peripheral vascular disease ([place]) 03/22/2013  . Hypomagnesemia 03/22/2013  . Absolute anemia 03/22/2013    Past Surgical History:  Procedure Laterality Date  . [place] STUDY N/A 02/04/2016   Procedure: [place] STUDY;  Surgeon: Lucilla Lame, MD;  Location: [facility] ENDOSCOPY;  Service: Endoscopy;  Laterality: N/A;  . ABDOMINAL HYSTERECTOMY    .  BACK SURGERY     Lumbar spinal fusion 05/30/2015  . CARDIAC CATHETERIZATION  2009  . CATARACT EXTRACTION Left   . CATARACT EXTRACTION W/PHACO Right 03/19/2015   Procedure: CATARACT EXTRACTION PHACO AND INTRAOCULAR LENS PLACEMENT ([place]);  Surgeon: Leandrew Koyanagi, MD;  Location: [facility];  Service: Ophthalmology;  Laterality: Right;  DIABETIC - insulin and oral meds  . CORONARY STENT PLACEMENT  03/2008  . CYSTECTOMY Right    wrist  . ESOPHAGEAL MANOMETRY N/A 02/04/2016   Procedure: ESOPHAGEAL  MANOMETRY (EM);  Surgeon: Lucilla Lame, MD;  Location: [facility] ENDOSCOPY;  Service: Endoscopy;  Laterality: N/A;  . ESOPHAGOGASTRODUODENOSCOPY (EGD) WITH PROPOFOL N/A 09/04/2015   Procedure: ESOPHAGOGASTRODUODENOSCOPY (EGD) WITH PROPOFOL;  Surgeon: Lucilla Lame, MD;  Location: [facility];  Service: Endoscopy;  Laterality: N/A;  INSULIN DEPENDENT DIABETIC  . EYE SURGERY Left    cataract extraction  . LUMBAR FUSION  2001, 2014, 2015  . [place]     2001, 2014, 2015  . TUMOR EXCISION     back of neck    Prior to Admission medications   Medication Sig Start Date End Date Taking? Authorizing Provider  acyclovir (ZOVIRAX) 400 MG tablet Take 400 mg by mouth as needed.  08/07/15  Yes Historical Provider, MD  aspirin EC 81 MG tablet Take 81 mg by mouth at bedtime.   Yes Historical Provider, MD  atorvastatin (LIPITOR) 40 MG tablet Take 40 mg by mouth at bedtime.  08/16/14  Yes Historical Provider, MD  calcium-vitamin D (OSCAL WITH D) 500-200 MG-UNIT tablet Take 1 tablet by mouth daily with breakfast.   Yes Historical Provider, MD  clopidogrel (PLAVIX) 75 MG tablet Take 1 tablet (75 mg total) by mouth daily. 07/11/15  Yes Bettey Costa, MD  diltiazem (CARDIZEM CD) 120 MG 24 hr capsule Take 1 capsule by mouth daily. 05/04/16 05/04/17 Yes Historical Provider, MD  ferrous sulfate 325 (65 FE) MG tablet Take 325 mg by mouth daily.   Yes Historical Provider, MD  glipiZIDE (GLUCOTROL) 10 MG tablet Take 10 mg by mouth 2 (two) times daily.   Yes Historical Provider, MD  insulin glargine (LANTUS) 100 UNIT/ML injection Inject 10 Units into the skin daily.   Yes Historical Provider, MD  insulin lispro (HUMALOG) 100 UNIT/ML injection Inject 2-6 Units into the skin 3 (three) times daily with meals.    Yes Historical Provider, MD  LORazepam (ATIVAN) 1 MG tablet Take 1 tablet (1 mg total) by mouth every 6 (six) hours as needed for anxiety. 05/09/16 05/09/17 Yes Anne-Caroline Mariea Clonts, MD  losartan (COZAAR) 100 MG tablet  Take 100 mg by mouth every morning. AM 02/05/14  Yes Historical Provider, MD  metoCLOPramide (REGLAN) 10 MG tablet Take 1 tablet (10 mg total) by mouth every 8 (eight) hours as needed for nausea. 03/28/16 03/28/17 Yes Nance Pear, MD  metoCLOPramide (REGLAN) 10 MG tablet Take 1 tablet by mouth daily. 03/28/16 03/28/17 Yes Historical Provider, MD  ondansetron (ZOFRAN ODT) 8 MG disintegrating tablet Take 8 mg by mouth as needed for nausea or vomiting.   Yes Historical Provider, MD  oxyCODONE (ROXICODONE) 5 MG/5ML solution Take 5 mLs (5 mg total) by mouth every 4 (four) hours as needed for moderate pain or severe pain. 03/14/16  Yes Clayburn Pert, MD  pantoprazole (PROTONIX) 20 MG tablet Take 20 mg by mouth daily.   Yes Historical Provider, MD  pantoprazole (PROTONIX) 40 MG tablet Take 1 tablet (40 mg total) by mouth daily. 05/07/16  Yes Clayburn Pert,  MD  sertraline (ZOLOFT) 50 MG tablet Take 50 mg by mouth at bedtime. 05/10/16  Yes Historical Provider, MD  vitamin B-12 (CYANOCOBALAMIN) 1000 MCG tablet Take 1,000 mcg by mouth daily.   Yes Historical Provider, MD  ranitidine (ZANTAC) 300 MG tablet Take 0.5 tablets (150 mg total) by mouth at bedtime. Patient not taking: Reported on 05/12/2016 05/05/16 05/05/17  Merlyn Lot, MD    Allergies Tramadol; Beta adrenergic blockers; Celebrex [celecoxib]; Niacin; Niacin and related; Oxycodone; Shellfish allergy; Vicodin [hydrocodone-acetaminophen]; and Heparin  Family History  Problem Relation Age of Onset  . Heart disease Father   . Diabetes Other   . Breast cancer Neg Hx     Social History Social History  Substance Use Topics  . Smoking status: Never Smoker  . Smokeless tobacco: Never Used  . Alcohol use 0.0 oz/week     Comment: 2-3 drinks per year    Review of Systems {Constitutional: No fever/chills Eyes: No visual changes. ENT: No sore throat. No stiff neck no neck pain Cardiovascular: Denies chest pain. Respiratory: Denies  shortness of breath. Gastrointestinal:   no vomiting.  No diarrhea.  No constipation. Genitourinary: Negative for dysuria. Musculoskeletal: Negative lower extremity swelling Skin: Negative for rash. Neurological: Negative for severe headaches, focal weakness or numbness. 10-point ROS otherwise negative.  ____________________________________________   PHYSICAL EXAM:  VITAL SIGNS: ED Triage Vitals  Enc Vitals Group     BP 05/12/16 0941 (!) 167/72     Pulse Rate 05/12/16 0941 (!) 102     Resp 05/12/16 0941 18     Temp 05/12/16 0941 98.3 F (36.8 C)     Temp Source 05/12/16 0941 Oral     SpO2 05/12/16 0941 99 %     Weight 05/12/16 0942 172 lb (78 kg)     Height 05/12/16 0942 5\' 3"  (1.6 m)     Head Circumference --      Peak Flow --      Pain Score --      Pain Loc --      Pain Edu? --      Excl. in [place]? --     Constitutional: Alert and oriented. Well appearing and in no acute distress.Patient somewhat anxious and upset but not toxic and medical appearance Eyes: Conjunctivae are normal. PERRL. EOMI. Head: Atraumatic. Nose: No congestion/rhinnorhea. Mouth/Throat: Mucous membranes are moist.  Oropharynx non-erythematous. Neck: No stridor.   Nontender with no meningismus Cardiovascular: Normal rate, regular rhythm. Grossly normal heart sounds.  Good peripheral circulation. Respiratory: Normal respiratory effort.  No retractions. Lungs CTAB. Abdominal: Soft and nontender. No distention. No guarding no rebound Back:  There is no focal tenderness or step off.  there is no midline tenderness there are no lesions noted. there is no CVA tenderness Musculoskeletal: No lower extremity tenderness, no upper extremity tenderness. No joint effusions, no DVT signs strong distal pulses no edema Neurologic:  Normal speech and language. No gross focal neurologic deficits are appreciated.  Skin:  Skin is warm, dry and intact. No rash noted. Psychiatric: Mood and affect are normal. Speech and  behavior are normal.  ____________________________________________   LABS (all labs ordered are listed, but only abnormal results are displayed)  Labs Reviewed  COMPREHENSIVE METABOLIC PANEL - Abnormal; Notable for the following:       Result Value   Glucose, Bld 220 (*)    BUN 26 (*)    Creatinine, Ser 1.68 (*)    Total Bilirubin 1.5 (*)    GFR  calc non Af Amer 31 (*)    GFR calc Af Amer 36 (*)    All other components within normal limits  CBC - Abnormal; Notable for the following:    RBC 3.67 (*)    Hemoglobin 11.5 (*)    HCT 33.0 (*)    RDW 14.7 (*)    All other components within normal limits  TROPONIN I  LIPASE, BLOOD   ____________________________________________  EKG  I personally interpreted any EKGs ordered by me or triage Sinus tach rate 108 and acute ST elevation or ST depression normal axis unremarkable  ____________________________________________  RADIOLOGY  I reviewed any imaging ordered by me or triage that were performed during my shift and, if possible, patient and/or family made aware of any abnormal findings. ____________________________________________   PROCEDURES  Procedure(s) performed: None  Procedures  Critical Care performed: None  ____________________________________________   INITIAL IMPRESSION / ASSESSMENT AND PLAN / ED COURSE  Pertinent labs & imaging results that were available during my care of the patient were reviewed by me and considered in my medical decision making (see chart for details).  Patient with symptoms of too much standing, blood work is reassuring vital signs are reassuring, patient had complete resolution of symptoms with Phenergan and a GI cocktail. I did discuss with Dr. Azalee Course of surgery. I asked her if they would prefer to have any further testing at this time they state no. Patient is scheduled for a barium swallow tomorrow and they feel that should be enough to cover her for now. They do not wish any  change in management. Extensive return precautions and follow-up given to the patient and her husband. She is very comfortable going home. Her abdomen, on serial exam, is completely benign.At this time, there does not appear to be clinical evidence to support the diagnosis of pulmonary embolus, dissection, myocarditis, endocarditis, pericarditis, pericardial tamponade, acute coronary syndrome, pneumothorax, pneumonia, or any other acute intrathoracic pathology that will require admission or acute intervention. Nor is there evidence of any significant intra-abdominal pathology causing this discomfort.  Clinical Course    ____________________________________________   FINAL CLINICAL IMPRESSION(S) / ED DIAGNOSES  Final diagnoses:  None      This chart was dictated using voice recognition software.  Despite best efforts to proofread,  errors can occur which can change meaning.      Schuyler Amor, MD 05/12/16 [phone]

## 2016-05-12 NOTE — ED Notes (Signed)
Triage note from [id number] completed by Hazle Coca RN not Charlynne Cousins.

## 2016-05-12 NOTE — Discharge Instructions (Signed)
Return to the emergency room for any new or worrisome symptoms including fever, pain, bleeding, vomiting, worsening pain, or any other concerning symptoms. Follow closely with primary care doctor and your surgeon.

## 2016-05-12 NOTE — ED Triage Notes (Signed)
Patient here complaining of severe nausea, being hungry and unable to eat.  Hx of hiatal hernia surgery Oct. 12, 2017 and has had stomach problems ever since.  Barium swallow scheduled tomorrow. Patient went to [facility] this AM and brought here.  Saw Dr. Doy Hutching on Monday of this week and was placed on Zoloft.  Sitting in W/C moaning and asking "please do something".  Husband at side.  Had Zofran at 0800 prior to arrival.

## 2016-05-12 NOTE — ED Notes (Signed)
Pt resting calmly in bed. Says that she feels better, that the nausea is not gone but has improved. Pt looks to be doing a lot better. No longer anxious, moaning or pacing about.

## 2016-05-12 NOTE — ED Triage Notes (Signed)
Pt c/o generalized weakness with nausea since yesterday.Marland Kitchen

## 2016-05-13 ENCOUNTER — Ambulatory Visit
Admission: RE | Admit: 2016-05-13 | Discharge: 2016-05-13 | Disposition: A | Discharge status: Home / Self Care | Payer: Medicare Other | Source: Ambulatory Visit | Attending: General Surgery | Admitting: General Surgery

## 2016-05-13 ENCOUNTER — Telehealth: Payer: Self-pay

## 2016-05-13 DIAGNOSIS — R110 Nausea: Secondary | ICD-10-CM | POA: Diagnosis not present

## 2016-05-13 DIAGNOSIS — K222 Esophageal obstruction: Secondary | ICD-10-CM | POA: Diagnosis not present

## 2016-05-13 DIAGNOSIS — R935 Abnormal findings on diagnostic imaging of other abdominal regions, including retroperitoneum: Secondary | ICD-10-CM | POA: Diagnosis present

## 2016-05-13 NOTE — Telephone Encounter (Signed)
Patient completed Barium Swallow Study.   Will see patient in am with Dr. Dahlia Byes as scheduled.

## 2016-05-13 NOTE — Telephone Encounter (Signed)
Dorothy Rogers (patient husband) called stating that Dorothy Rogers is downstairs but she is feel so nauseous that she doesn't think she can do the barrium swallow. I spoke with a nurse and the instructions were to at least try to do it because there is nothing else that can be done for her. I explained this to Dorothy Rogers and they are going to try.   Dorothy Rogers has some additional questions that he would like to speak to a nurse about. He understands that the nurses are seeing patients today and that they will get back to him as soon as they can. Please call patient and advice

## 2016-05-14 ENCOUNTER — Ambulatory Visit (INDEPENDENT_AMBULATORY_CARE_PROVIDER_SITE_OTHER): Payer: Medicare Other | Admitting: Surgery

## 2016-05-14 ENCOUNTER — Encounter: Payer: Self-pay | Admitting: Surgery

## 2016-05-14 VITALS — BP 170/89 | HR 111 | Temp 98.9°F | Ht 63.0 in | Wt 172.4 lb

## 2016-05-14 DIAGNOSIS — Z09 Encounter for follow-up examination after completed treatment for conditions other than malignant neoplasm: Secondary | ICD-10-CM

## 2016-05-14 MED ORDER — PROCHLORPERAZINE MALEATE 10 MG PO TABS: 10.0000 mg | ORAL_TABLET | Freq: Four times a day (QID) | ORAL | 0 refills | Status: DC | PRN

## 2016-05-14 NOTE — Patient Instructions (Signed)
Please use your Zofran, Compazine, and Reglan to control nausea.  Follow-up with Dr. Adonis Huguenin as scheduled

## 2016-05-14 NOTE — Progress Notes (Signed)
S/p lap Paraesophageal H w nissen fundoplication [id number] Now w severe nausea and hunger pain Taking zofran and phenergan but now phenergan is causing drowsiness and headaches. She is able to drink fluid and some soft diet but does have a lot of nausea. Barium swallow personally reviewed, no evidence of leak and contrast reaches the stomach, narrowing at [place] not unusual after Nissen  PE NAD, anxious Abd: soft NT, incisions healed, no infection, no peritonitis  A/P Compazin added and she may take reglan in addition to zofran. No need for surgical intervention Resume SSRI as this may actually help w tone of esophagel sphincter May need EGD and dilation if no improvement F/U w Dr. Adonis Huguenin next week

## 2016-05-16 ENCOUNTER — Ambulatory Visit: Payer: Self-pay | Admitting: General Surgery

## 2016-05-17 ENCOUNTER — Encounter: Payer: Self-pay | Admitting: Emergency Medicine

## 2016-05-17 ENCOUNTER — Observation Stay
Admission: EM | Admit: 2016-05-17 | Discharge: 2016-05-20 | Disposition: A | Discharge status: Home / Self Care | Payer: Medicare Other | Attending: Internal Medicine | Admitting: Internal Medicine

## 2016-05-17 DIAGNOSIS — M4690 Unspecified inflammatory spondylopathy, site unspecified: Secondary | ICD-10-CM | POA: Insufficient documentation

## 2016-05-17 DIAGNOSIS — I471 Supraventricular tachycardia: Secondary | ICD-10-CM | POA: Insufficient documentation

## 2016-05-17 DIAGNOSIS — E860 Dehydration: Secondary | ICD-10-CM | POA: Diagnosis not present

## 2016-05-17 DIAGNOSIS — M961 Postlaminectomy syndrome, not elsewhere classified: Secondary | ICD-10-CM | POA: Insufficient documentation

## 2016-05-17 DIAGNOSIS — F4320 Adjustment disorder, unspecified: Secondary | ICD-10-CM | POA: Diagnosis not present

## 2016-05-17 DIAGNOSIS — K5900 Constipation, unspecified: Secondary | ICD-10-CM | POA: Insufficient documentation

## 2016-05-17 DIAGNOSIS — D649 Anemia, unspecified: Secondary | ICD-10-CM | POA: Insufficient documentation

## 2016-05-17 DIAGNOSIS — M5136 Other intervertebral disc degeneration, lumbar region: Secondary | ICD-10-CM | POA: Insufficient documentation

## 2016-05-17 DIAGNOSIS — Z7902 Long term (current) use of antithrombotics/antiplatelets: Secondary | ICD-10-CM | POA: Insufficient documentation

## 2016-05-17 DIAGNOSIS — Z981 Arthrodesis status: Secondary | ICD-10-CM | POA: Diagnosis not present

## 2016-05-17 DIAGNOSIS — R110 Nausea: Secondary | ICD-10-CM

## 2016-05-17 DIAGNOSIS — E43 Unspecified severe protein-calorie malnutrition: Secondary | ICD-10-CM | POA: Insufficient documentation

## 2016-05-17 DIAGNOSIS — Z7982 Long term (current) use of aspirin: Secondary | ICD-10-CM | POA: Insufficient documentation

## 2016-05-17 DIAGNOSIS — K449 Diaphragmatic hernia without obstruction or gangrene: Secondary | ICD-10-CM | POA: Diagnosis not present

## 2016-05-17 DIAGNOSIS — R42 Dizziness and giddiness: Secondary | ICD-10-CM | POA: Insufficient documentation

## 2016-05-17 DIAGNOSIS — I2510 Atherosclerotic heart disease of native coronary artery without angina pectoris: Secondary | ICD-10-CM | POA: Diagnosis not present

## 2016-05-17 DIAGNOSIS — M160 Bilateral primary osteoarthritis of hip: Secondary | ICD-10-CM | POA: Diagnosis not present

## 2016-05-17 DIAGNOSIS — Z794 Long term (current) use of insulin: Secondary | ICD-10-CM | POA: Insufficient documentation

## 2016-05-17 DIAGNOSIS — D696 Thrombocytopenia, unspecified: Secondary | ICD-10-CM | POA: Diagnosis not present

## 2016-05-17 DIAGNOSIS — G5702 Lesion of sciatic nerve, left lower limb: Secondary | ICD-10-CM | POA: Insufficient documentation

## 2016-05-17 DIAGNOSIS — G43A0 Cyclical vomiting, not intractable: Secondary | ICD-10-CM | POA: Diagnosis not present

## 2016-05-17 DIAGNOSIS — M5106 Intervertebral disc disorders with myelopathy, lumbar region: Secondary | ICD-10-CM | POA: Insufficient documentation

## 2016-05-17 DIAGNOSIS — I129 Hypertensive chronic kidney disease with stage 1 through stage 4 chronic kidney disease, or unspecified chronic kidney disease: Secondary | ICD-10-CM | POA: Insufficient documentation

## 2016-05-17 DIAGNOSIS — E1151 Type 2 diabetes mellitus with diabetic peripheral angiopathy without gangrene: Secondary | ICD-10-CM | POA: Insufficient documentation

## 2016-05-17 DIAGNOSIS — E785 Hyperlipidemia, unspecified: Secondary | ICD-10-CM | POA: Diagnosis not present

## 2016-05-17 DIAGNOSIS — Z955 Presence of coronary angioplasty implant and graft: Secondary | ICD-10-CM | POA: Insufficient documentation

## 2016-05-17 DIAGNOSIS — M48061 Spinal stenosis, lumbar region without neurogenic claudication: Secondary | ICD-10-CM | POA: Insufficient documentation

## 2016-05-17 DIAGNOSIS — E1122 Type 2 diabetes mellitus with diabetic chronic kidney disease: Secondary | ICD-10-CM | POA: Diagnosis not present

## 2016-05-17 DIAGNOSIS — E538 Deficiency of other specified B group vitamins: Secondary | ICD-10-CM | POA: Insufficient documentation

## 2016-05-17 DIAGNOSIS — K8010 Calculus of gallbladder with chronic cholecystitis without obstruction: Principal | ICD-10-CM | POA: Insufficient documentation

## 2016-05-17 DIAGNOSIS — M5417 Radiculopathy, lumbosacral region: Secondary | ICD-10-CM | POA: Insufficient documentation

## 2016-05-17 DIAGNOSIS — K2970 Gastritis, unspecified, without bleeding: Secondary | ICD-10-CM | POA: Insufficient documentation

## 2016-05-17 DIAGNOSIS — Z90710 Acquired absence of both cervix and uterus: Secondary | ICD-10-CM | POA: Insufficient documentation

## 2016-05-17 DIAGNOSIS — R112 Nausea with vomiting, unspecified: Secondary | ICD-10-CM | POA: Diagnosis present

## 2016-05-17 DIAGNOSIS — Z9841 Cataract extraction status, right eye: Secondary | ICD-10-CM | POA: Insufficient documentation

## 2016-05-17 DIAGNOSIS — Z833 Family history of diabetes mellitus: Secondary | ICD-10-CM | POA: Insufficient documentation

## 2016-05-17 DIAGNOSIS — K8020 Calculus of gallbladder without cholecystitis without obstruction: Secondary | ICD-10-CM

## 2016-05-17 DIAGNOSIS — N179 Acute kidney failure, unspecified: Secondary | ICD-10-CM | POA: Insufficient documentation

## 2016-05-17 DIAGNOSIS — N183 Chronic kidney disease, stage 3 (moderate): Secondary | ICD-10-CM | POA: Insufficient documentation

## 2016-05-17 DIAGNOSIS — E871 Hypo-osmolality and hyponatremia: Secondary | ICD-10-CM | POA: Insufficient documentation

## 2016-05-17 DIAGNOSIS — K760 Fatty (change of) liver, not elsewhere classified: Secondary | ICD-10-CM | POA: Insufficient documentation

## 2016-05-17 DIAGNOSIS — Z8249 Family history of ischemic heart disease and other diseases of the circulatory system: Secondary | ICD-10-CM | POA: Insufficient documentation

## 2016-05-17 DIAGNOSIS — Z9842 Cataract extraction status, left eye: Secondary | ICD-10-CM | POA: Insufficient documentation

## 2016-05-17 DIAGNOSIS — Z79899 Other long term (current) drug therapy: Secondary | ICD-10-CM | POA: Insufficient documentation

## 2016-05-17 LAB — COMPREHENSIVE METABOLIC PANEL
ALBUMIN: 4.5 g/dL (ref 3.5–5.0)
ALT: 26 U/L (ref 14–54)
AST: 21 U/L (ref 15–41)
Alkaline Phosphatase: 79 U/L (ref 38–126)
Anion gap: 11 (ref 5–15)
BILIRUBIN TOTAL: 1.2 mg/dL (ref 0.3–1.2)
BUN: 25 mg/dL — AB (ref 6–20)
CHLORIDE: 100 mmol/L — AB (ref 101–111)
CO2: 23 mmol/L (ref 22–32)
CREATININE: 1.77 mg/dL — AB (ref 0.44–1.00)
Calcium: 9.1 mg/dL (ref 8.9–10.3)
GFR calc Af Amer: 33 mL/min — ABNORMAL LOW (ref >60)
GFR, EST NON AFRICAN AMERICAN: 29 mL/min — AB (ref >60)
GLUCOSE: 302 mg/dL — AB (ref 65–99)
POTASSIUM: 4.5 mmol/L (ref 3.5–5.1)
Sodium: 134 mmol/L — ABNORMAL LOW (ref 135–145)
Total Protein: 7.7 g/dL (ref 6.5–8.1)

## 2016-05-17 LAB — CBC WITH DIFFERENTIAL/PLATELET
Basophils Absolute: 0.1 10*3/uL (ref 0–0.1)
Basophils Relative: 1 %
EOS ABS: 0 10*3/uL (ref 0–0.7)
EOS PCT: 0 %
HCT: 34.3 % — ABNORMAL LOW (ref 35.0–47.0)
Hemoglobin: 11.9 g/dL — ABNORMAL LOW (ref 12.0–16.0)
LYMPHS ABS: 1 10*3/uL (ref 1.0–3.6)
LYMPHS PCT: 15 %
MCH: 31.3 pg (ref 26.0–34.0)
MCHC: 34.5 g/dL (ref 32.0–36.0)
MCV: 90.7 fL (ref 80.0–100.0)
Monocytes Absolute: 0.5 10*3/uL (ref 0.2–0.9)
Monocytes Relative: 7 %
Neutro Abs: 5.1 10*3/uL (ref 1.4–6.5)
Neutrophils Relative %: 77 %
PLATELETS: 251 10*3/uL (ref 150–440)
RBC: 3.79 MIL/uL — ABNORMAL LOW (ref 3.80–5.20)
RDW: 14.7 % — AB (ref 11.5–14.5)
WBC: 6.7 10*3/uL (ref 3.6–11.0)

## 2016-05-17 LAB — MAGNESIUM: Magnesium: 2 mg/dL (ref 1.7–2.4)

## 2016-05-17 LAB — GLUCOSE, CAPILLARY: GLUCOSE-CAPILLARY: 189 mg/dL — AB (ref 65–99)

## 2016-05-17 LAB — TSH: TSH: 4.154 u[IU]/mL (ref 0.350–4.500)

## 2016-05-17 MED ORDER — LOSARTAN POTASSIUM 50 MG PO TABS
100.0000 mg | ORAL_TABLET | Freq: Every morning | ORAL | Status: DC
Start: 2016-05-18 — End: 2016-05-20
  Administered 2016-05-18 – 2016-05-20 (×2): 100 mg via ORAL
  Filled 2016-05-17 (×2): qty 2

## 2016-05-17 MED ORDER — SERTRALINE HCL 50 MG PO TABS
50.0000 mg | ORAL_TABLET | Freq: Every day | ORAL | Status: DC
  Filled 2016-05-17 (×3): qty 1

## 2016-05-17 MED ORDER — PROCHLORPERAZINE MALEATE 10 MG PO TABS: 10.0000 mg | ORAL_TABLET | Freq: Four times a day (QID) | ORAL | Status: DC | PRN

## 2016-05-17 MED ORDER — PANTOPRAZOLE SODIUM 40 MG PO TBEC
40.0000 mg | DELAYED_RELEASE_TABLET | Freq: Two times a day (BID) | ORAL | Status: DC
  Administered 2016-05-18 – 2016-05-20 (×2): 40 mg via ORAL
  Filled 2016-05-17 (×5): qty 1

## 2016-05-17 MED ORDER — SODIUM CHLORIDE 0.9 % IV BOLUS (SEPSIS)
1000.0000 mL | Freq: Once | INTRAVENOUS | Status: AC
Start: 2016-05-17 — End: 2016-05-17
  Administered 2016-05-17: 1000 mL via INTRAVENOUS

## 2016-05-17 MED ORDER — POTASSIUM CHLORIDE IN NACL 20-0.9 MEQ/L-% IV SOLN
INTRAVENOUS | Status: DC
  Administered 2016-05-17 – 2016-05-20 (×5): via INTRAVENOUS
  Filled 2016-05-17 (×7): qty 1000

## 2016-05-17 MED ORDER — GI COCKTAIL ~~LOC~~
30.0000 mL | Freq: Once | ORAL | Status: AC
  Administered 2016-05-17: 30 mL via ORAL
  Filled 2016-05-17: qty 30

## 2016-05-17 MED ORDER — ONDANSETRON HCL 4 MG/2ML IJ SOLN: 4.0000 mg | Freq: Four times a day (QID) | INTRAMUSCULAR | Status: DC | PRN

## 2016-05-17 MED ORDER — DILTIAZEM HCL ER COATED BEADS 120 MG PO CP24
120.0000 mg | ORAL_CAPSULE | Freq: Every day | ORAL | Status: DC
  Administered 2016-05-18 – 2016-05-20 (×2): 120 mg via ORAL
  Filled 2016-05-17 (×2): qty 1

## 2016-05-17 MED ORDER — PROMETHAZINE HCL 25 MG/ML IJ SOLN
12.5000 mg | Freq: Once | INTRAMUSCULAR | Status: AC
  Administered 2016-05-17: 12.5 mg via INTRAVENOUS

## 2016-05-17 MED ORDER — ONDANSETRON HCL 4 MG PO TABS: 4.0000 mg | ORAL_TABLET | Freq: Four times a day (QID) | ORAL | Status: DC | PRN

## 2016-05-17 MED ORDER — ATORVASTATIN CALCIUM 20 MG PO TABS
40.0000 mg | ORAL_TABLET | Freq: Every day | ORAL | Status: DC
  Administered 2016-05-17 – 2016-05-19 (×3): 40 mg via ORAL
  Filled 2016-05-17 (×4): qty 2

## 2016-05-17 MED ORDER — INSULIN ASPART 100 UNIT/ML ~~LOC~~ SOLN: 0.0000 [IU] | Freq: Every day | SUBCUTANEOUS | Status: DC

## 2016-05-17 MED ORDER — INSULIN ASPART 100 UNIT/ML ~~LOC~~ SOLN
0.0000 [IU] | Freq: Three times a day (TID) | SUBCUTANEOUS | Status: DC
  Administered 2016-05-19: 17:00:00 2 [IU] via SUBCUTANEOUS
  Administered 2016-05-20: 09:00:00 3 [IU] via SUBCUTANEOUS
  Administered 2016-05-20: 13:00:00 2 [IU] via SUBCUTANEOUS
  Filled 2016-05-17 (×2): qty 2
  Filled 2016-05-17: qty 3
  Filled 2016-05-17 (×2): qty 2

## 2016-05-17 MED ORDER — LORAZEPAM 1 MG PO TABS: 1.0000 mg | ORAL_TABLET | Freq: Four times a day (QID) | ORAL | Status: DC | PRN

## 2016-05-17 MED ORDER — PROMETHAZINE HCL 25 MG/ML IJ SOLN
INTRAMUSCULAR | Status: AC
  Administered 2016-05-17: 12.5 mg via INTRAVENOUS
  Filled 2016-05-17: qty 1

## 2016-05-17 MED ORDER — FERROUS SULFATE 325 (65 FE) MG PO TABS
325.0000 mg | ORAL_TABLET | Freq: Every day | ORAL | Status: DC
  Administered 2016-05-18 – 2016-05-20 (×2): 325 mg via ORAL
  Filled 2016-05-17 (×2): qty 1

## 2016-05-17 MED ORDER — SODIUM CHLORIDE 0.9 % IV SOLN
Freq: Once | INTRAVENOUS | Status: AC
  Administered 2016-05-17: 13:00:00 via INTRAVENOUS

## 2016-05-17 NOTE — H&P (Signed)
[facility] [place] NAME: Dorothy Rogers    MR#:  [id number]  DATE OF BIRTH:  Jun 16, 1950  DATE OF ADMISSION:  05/17/2016  PRIMARY CARE PHYSICIAN: Idelle Crouch, MD   REQUESTING/REFERRING PHYSICIAN: Schuyler Amor, MD  CHIEF COMPLAINT:   Chief Complaint  Patient presents with  . Nausea   Intractable nausea and vomiting for 2 months. HISTORY OF PRESENT ILLNESS:  Dorothy Rogers  is a 65 y.o. female with a known history of Hypertension, CK D stage III, CAD, diabetes and anemia. She is S/p lap Paraesophageal H w nissen fundoplication [id number]. She developed nausea and vomiting after surgery. She came to the ED multiple times, was given Zofran and Phenergan and sent home. But she still has intractable nausea and vomiting. She denies any abdominal pain, diarrhea, melena or bloody stool. ED physician suggested admitting patient and get surgical and GI consult.  PAST MEDICAL HISTORY:   Past Medical History:  Diagnosis Date  . Anemia   . Anemia   . Arthritis    lower back, hips  . Blood transfusion without reported diagnosis   . CAD (coronary artery disease) FEB AND NOV 2009   6 STENTS  . Cancer ([employer])    tumor back of neck-fibroushistocytoma  . Cataract   . Chronic kidney disease    Stage III  . Coronary artery disease   . DDD (degenerative disc disease), lumbar   . Diabetes mellitus without complication ([place])    TYPE 2  . Herpes simplex   . Hyperlipidemia   . Hypertension    CONTROLLED ON MEDS  . Vertigo    occasional, no episodes in 2-3 months  . Vertigo    OCCASIONALLY  . Vitamin B 12 deficiency     PAST SURGICAL HISTORY:   Past Surgical History:  Procedure Laterality Date  . [place] STUDY N/A 02/04/2016   Procedure: [place] STUDY;  Surgeon: Lucilla Lame, MD;  Location: [facility] ENDOSCOPY;  Service: Endoscopy;  Laterality: N/A;  . ABDOMINAL HYSTERECTOMY    . BACK SURGERY     Lumbar spinal fusion 05/30/2015  . CARDIAC  CATHETERIZATION  2009  . CATARACT EXTRACTION Left   . CATARACT EXTRACTION W/PHACO Right 03/19/2015   Procedure: CATARACT EXTRACTION PHACO AND INTRAOCULAR LENS PLACEMENT ([place]);  Surgeon: Leandrew Koyanagi, MD;  Location: [facility];  Service: Ophthalmology;  Laterality: Right;  DIABETIC - insulin and oral meds  . CORONARY STENT PLACEMENT  03/2008  . CYSTECTOMY Right    wrist  . ESOPHAGEAL MANOMETRY N/A 02/04/2016   Procedure: ESOPHAGEAL MANOMETRY (EM);  Surgeon: Lucilla Lame, MD;  Location: [facility] ENDOSCOPY;  Service: Endoscopy;  Laterality: N/A;  . ESOPHAGOGASTRODUODENOSCOPY (EGD) WITH PROPOFOL N/A 09/04/2015   Procedure: ESOPHAGOGASTRODUODENOSCOPY (EGD) WITH PROPOFOL;  Surgeon: Lucilla Lame, MD;  Location: [place];  Service: Endoscopy;  Laterality: N/A;  INSULIN DEPENDENT DIABETIC  . EYE SURGERY Left    cataract extraction  . LUMBAR FUSION  2001, 2014, 2015  . [place]     2001, 2014, 2015  . TUMOR EXCISION     back of neck    SOCIAL HISTORY:   Social History  Substance Use Topics  . Smoking status: Never Smoker  . Smokeless tobacco: Never Used  . Alcohol use 0.0 oz/week     Comment: 2-3 drinks per year    FAMILY HISTORY:   Family History  Problem Relation Age of Onset  . Heart disease Father   .  Diabetes Other   . Breast cancer Neg Hx     DRUG ALLERGIES:   Allergies  Allergen Reactions  . Tramadol Swelling  . Beta Adrenergic Blockers Hives  . Celebrex [Celecoxib] Other (See Comments)    A lot of pressure in head  . Niacin Other (See Comments)    flushing  . Niacin And Related Other (See Comments)    Flushing and hot sensation  . Oxycodone Nausea Only  . Shellfish Allergy Nausea And Vomiting  . Vicodin [Hydrocodone-Acetaminophen] Other (See Comments)    Hypoglycemic episodes  . Heparin Rash    REVIEW OF SYSTEMS:   Review of Systems  Constitutional: Positive for malaise/fatigue. Negative for chills and fever.  HENT: Negative for  congestion.   Eyes: Negative for blurred vision and double vision.  Respiratory: Negative for cough, shortness of breath and stridor.   Cardiovascular: Negative for chest pain and leg swelling.  Gastrointestinal: Positive for constipation, nausea and vomiting. Negative for abdominal pain, blood in stool, diarrhea and melena.  Genitourinary: Negative for dysuria and hematuria.  Musculoskeletal: Negative for joint pain.  Skin: Negative for itching and rash.  Neurological: Negative for dizziness, focal weakness and loss of consciousness.  Psychiatric/Behavioral: Negative for depression. The patient is not nervous/anxious.     MEDICATIONS AT HOME:   Prior to Admission medications   Medication Sig Start Date End Date Taking? Authorizing Provider  atorvastatin (LIPITOR) 40 MG tablet Take 40 mg by mouth at bedtime.  08/16/14  Yes Historical Provider, MD  calcium-vitamin D (OSCAL WITH D) 500-200 MG-UNIT tablet Take 1 tablet by mouth daily with breakfast.   Yes Historical Provider, MD  clopidogrel (PLAVIX) 75 MG tablet Take 75 mg by mouth daily.   Yes Historical Provider, MD  diltiazem (CARDIZEM CD) 120 MG 24 hr capsule Take 1 capsule by mouth daily. 05/04/16 05/04/17 Yes Historical Provider, MD  ferrous sulfate 325 (65 FE) MG tablet Take 325 mg by mouth daily.   Yes Historical Provider, MD  glipiZIDE (GLUCOTROL) 10 MG tablet Take 10 mg by mouth 2 (two) times daily.   Yes Historical Provider, MD  insulin lispro (HUMALOG) 100 UNIT/ML injection Inject 4-10 Units into the skin 3 (three) times daily with meals. Based on sliding scale. Mid range.   Yes Historical Provider, MD  losartan (COZAAR) 100 MG tablet Take 100 mg by mouth every morning. AM 02/05/14  Yes Historical Provider, MD  ondansetron (ZOFRAN ODT) 8 MG disintegrating tablet Take 8 mg by mouth as needed for nausea or vomiting.   Yes Historical Provider, MD  pantoprazole (PROTONIX) 20 MG tablet Take 20 mg by mouth daily.   Yes Historical Provider, MD   prochlorperazine (COMPAZINE) 10 MG tablet Take 1 tablet (10 mg total) by mouth every 6 (six) hours as needed for nausea or vomiting. 05/14/16  Yes Diego F Pabon, MD  vitamin B-12 (CYANOCOBALAMIN) 1000 MCG tablet Take 1,000 mcg by mouth daily.   Yes Historical Provider, MD  acyclovir (ZOVIRAX) 400 MG tablet Take 400 mg by mouth as needed.  08/07/15   Historical Provider, MD  insulin glargine (LANTUS) 100 UNIT/ML injection Inject 10 Units into the skin daily.    Historical Provider, MD  LORazepam (ATIVAN) 1 MG tablet Take 1 tablet (1 mg total) by mouth every 6 (six) hours as needed for anxiety. 05/09/16 05/09/17  Anne-Caroline Mariea Clonts, MD  promethazine (PHENERGAN) 12.5 MG tablet Take 1 tablet (12.5 mg total) by mouth every 6 (six) hours as needed for nausea or vomiting.  05/12/16   Schuyler Amor, MD  sertraline (ZOLOFT) 50 MG tablet Take 50 mg by mouth at bedtime. 05/10/16   Historical Provider, MD      VITAL SIGNS:  Blood pressure 129/73, pulse 91, temperature 98.3 F (36.8 C), temperature source Oral, resp. rate 20, height 5\' 2"  (1.575 m), weight 173 lb (78.5 kg), SpO2 97 %.  PHYSICAL EXAMINATION:  Physical Exam  GENERAL:  65 y.o.-year-old patient lying in the bed with no acute distress. Obese. EYES: Pupils equal, round, reactive to light and accommodation. No scleral icterus. Extraocular muscles intact.  HEENT: Head atraumatic, normocephalic. Oropharynx and nasopharynx clear.  NECK:  Supple, no jugular venous distention. No thyroid enlargement, no tenderness.  LUNGS: Normal breath sounds bilaterally, no wheezing, rales,rhonchi or crepitation. No use of accessory muscles of respiration.  CARDIOVASCULAR: S1, S2 normal. No murmurs, rubs, or gallops.  ABDOMEN: Soft, nontender, nondistended. Bowel sounds present. No organomegaly or mass.  EXTREMITIES: No pedal edema, cyanosis, or clubbing.  NEUROLOGIC: Cranial nerves II through XII are intact. Muscle strength 5/5 in all extremities. Sensation  intact. Gait not checked.  PSYCHIATRIC: The patient is alert and oriented x 3.  SKIN: No obvious rash, lesion, or ulcer.   LABORATORY PANEL:   CBC  Recent Labs Lab 05/17/16 1224  WBC 6.7  HGB 11.9*  HCT 34.3*  PLT 251   ------------------------------------------------------------------------------------------------------------------  Chemistries   Recent Labs Lab 05/17/16 1224  NA 134*  K 4.5  CL 100*  CO2 23  GLUCOSE 302*  BUN 25*  CREATININE 1.77*  CALCIUM 9.1  AST 21  ALT 26  ALKPHOS 79  BILITOT 1.2   ------------------------------------------------------------------------------------------------------------------  Cardiac Enzymes  Recent Labs Lab 05/12/16 1030  TROPONINI <0.03   ------------------------------------------------------------------------------------------------------------------  RADIOLOGY:  No results found.    IMPRESSION AND PLAN:   Intractable nausea and vomiting The patient will be placed for observation. I will give Zofran and Phenergan when necessary, IV fluid support, GI and surgery consult. Nothing by mouth after midnight for possible further workup by specialist. Hold aspirin and Plavix.  ARF on CKD stage III. Start IV fluid support and follow-up BMP. Hyponatremia. Normal saline IV. Hypertension. Continue hypertension medication. Diabetes. Start sliding scale but hold Lantus due to poor oral intake secondary to nausea and vomiting.   All the records are reviewed and case discussed with ED provider. Management plans discussed with the patient, her husband  and they are in agreement.  CODE STATUS:  full code   TOTAFull codeL TIME TAKING CARE OF THIS PATIENT: 51 minutes.    Demetrios Loll M.D on 05/17/2016 at 6:09 PM  Between 7am to 6pm - Pager - [phone]  After 6pm go to [email or web address] - Proofreader  [facility]  Office  [phone]  CC: Primary care physician; Idelle Crouch,  MD   Note: This dictation was prepared with Dragon dictation along with smaller phrase technology. Any transcriptional errors that result from this process are unintentional.

## 2016-05-17 NOTE — ED Provider Notes (Signed)
[facility] Emergency Department Provider Note  ____________________________________________   I have reviewed the triage vital signs and the nursing notes.   HISTORY  Chief Complaint Nausea    HPI Dorothy Rogers is a 65 y.o. female please see my prior H&P. Patient with nausea without vomiting and a sensation of early satiety since 2 months ago. Multiple visits to emergency room for same. Has lost 14 pounds. No significant change in the symptoms. Had a negative barium swallow a few days ago and I discussed this with surgery who has reviewed it.    Past Medical History:  Diagnosis Date  . Anemia   . Anemia   . Arthritis    lower back, hips  . Blood transfusion without reported diagnosis   . CAD (coronary artery disease) FEB AND NOV 2009   6 STENTS  . Cancer ([place])    tumor back of neck-fibroushistocytoma  . Cataract   . Chronic kidney disease    Stage III  . Coronary artery disease   . DDD (degenerative disc disease), lumbar   . Diabetes mellitus without complication ([place])    TYPE 2  . Herpes simplex   . Hyperlipidemia   . Hypertension    CONTROLLED ON MEDS  . Vertigo    occasional, no episodes in 2-3 months  . Vertigo    OCCASIONALLY  . Vitamin B 12 deficiency     Patient Active Problem List   Diagnosis Date Noted  . Supraventricular tachycardia ([place]) 05/05/2016  . SOB (shortness of breath)   . Near syncope 04/07/2016  . 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 Non-intractable cyclical vomiting with nausea   . Congenital esophageal defect   . Hiatal hernia   . Gastritis   . Diarrhea 07/10/2015  . Diabetes mellitus ([place]) 07/02/2015  . Adjustment disorder 06/16/2015  . Abdominal pain 06/16/2015  . C. difficile colitis 06/16/2015  . Intractable nausea and vomiting 06/09/2015  . Dehydration 06/09/2015  . Type 2 diabetes mellitus ([place]) 06/09/2015  . HLD (hyperlipidemia) 06/09/2015  . HTN (hypertension) 06/09/2015  . CAD (coronary  artery disease) 06/09/2015  . CKD (chronic kidney disease), stage III 06/09/2015  . Thrombocytopenia ([place]) 05/30/2015  . Lumbar canal stenosis 05/30/2015  . Disc disease with myelopathy, lumbar 05/30/2015  . Presence of stent in coronary artery 05/30/2015  . 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 Status post lumbar spinal fusion 12/25/2014  . DDD (degenerative disc disease), lumbar 11/18/2014  . Lumbar post-laminectomy syndrome 11/18/2014  . Facet syndrome, lumbar 11/18/2014  . Piriformis syndrome of left side 11/18/2014  . Status post lumbar spine operation 05/09/2014  . Adiposity 04/23/2014  . Thoracic and lumbosacral neuritis 03/27/2013  . H/O arthrodesis 03/27/2013  . Peripheral vascular disease ([place]) 03/22/2013  . Hypomagnesemia 03/22/2013  . Absolute anemia 03/22/2013    Past Surgical History:  Procedure Laterality Date  . [place] STUDY N/A 02/04/2016   Procedure: [place] STUDY;  Surgeon: Lucilla Lame, MD;  Location: [facility] ENDOSCOPY;  Service: Endoscopy;  Laterality: N/A;  . ABDOMINAL HYSTERECTOMY    . BACK SURGERY     Lumbar spinal fusion 05/30/2015  . CARDIAC CATHETERIZATION  2009  . CATARACT EXTRACTION Left   . CATARACT EXTRACTION W/PHACO Right 03/19/2015   Procedure: CATARACT EXTRACTION PHACO AND INTRAOCULAR LENS PLACEMENT ([place]);  Surgeon: Leandrew Koyanagi, MD;  Location: [place];  Service: Ophthalmology;  Laterality: Right;  DIABETIC - insulin and oral meds  . CORONARY STENT PLACEMENT  03/2008  .  CYSTECTOMY Right    wrist  . ESOPHAGEAL MANOMETRY N/A 02/04/2016   Procedure: ESOPHAGEAL MANOMETRY (EM);  Surgeon: Lucilla Lame, MD;  Location: [facility] ENDOSCOPY;  Service: Endoscopy;  Laterality: N/A;  . ESOPHAGOGASTRODUODENOSCOPY (EGD) WITH PROPOFOL N/A 09/04/2015   Procedure: ESOPHAGOGASTRODUODENOSCOPY (EGD) WITH PROPOFOL;  Surgeon: Lucilla Lame, MD;  Location: [place];  Service: Endoscopy;  Laterality: N/A;  INSULIN DEPENDENT DIABETIC  . EYE  SURGERY Left    cataract extraction  . LUMBAR FUSION  2001, 2014, 2015  . [place]     2001, 2014, 2015  . TUMOR EXCISION     back of neck    Prior to Admission medications   Medication Sig Start Date End Date Taking? Authorizing Provider  acyclovir (ZOVIRAX) 400 MG tablet Take 400 mg by mouth as needed.  08/07/15   Historical Provider, MD  aspirin EC 81 MG tablet Take 81 mg by mouth at bedtime.    Historical Provider, MD  atorvastatin (LIPITOR) 40 MG tablet Take 40 mg by mouth at bedtime.  08/16/14   Historical Provider, MD  calcium-vitamin D (OSCAL WITH D) 500-200 MG-UNIT tablet Take 1 tablet by mouth daily with breakfast.    Historical Provider, MD  clopidogrel (PLAVIX) 75 MG tablet Take 1 tablet (75 mg total) by mouth daily. 07/11/15   Bettey Costa, MD  diltiazem (CARDIZEM CD) 120 MG 24 hr capsule Take 1 capsule by mouth daily. 05/04/16 05/04/17  Historical Provider, MD  ferrous sulfate 325 (65 FE) MG tablet Take 325 mg by mouth daily.    Historical Provider, MD  glipiZIDE (GLUCOTROL) 10 MG tablet Take 10 mg by mouth 2 (two) times daily.    Historical Provider, MD  insulin glargine (LANTUS) 100 UNIT/ML injection Inject 10 Units into the skin daily.    Historical Provider, MD  insulin lispro (HUMALOG) 100 UNIT/ML injection Inject 2-6 Units into the skin 3 (three) times daily with meals.     Historical Provider, MD  LORazepam (ATIVAN) 1 MG tablet Take 1 tablet (1 mg total) by mouth every 6 (six) hours as needed for anxiety. 05/09/16 05/09/17  Anne-Caroline Mariea Clonts, MD  losartan (COZAAR) 100 MG tablet Take 100 mg by mouth every morning. AM 02/05/14   Historical Provider, MD  ondansetron (ZOFRAN ODT) 8 MG disintegrating tablet Take 8 mg by mouth as needed for nausea or vomiting.    Historical Provider, MD  pantoprazole (PROTONIX) 20 MG tablet Take 20 mg by mouth daily.    Historical Provider, MD  prochlorperazine (COMPAZINE) 10 MG tablet Take 1 tablet (10 mg total) by mouth every 6 (six) hours as  needed for nausea or vomiting. 05/14/16   Jules Husbands, MD  promethazine (PHENERGAN) 12.5 MG tablet Take 1 tablet (12.5 mg total) by mouth every 6 (six) hours as needed for nausea or vomiting. 05/12/16   Schuyler Amor, MD  sertraline (ZOLOFT) 50 MG tablet Take 50 mg by mouth at bedtime. 05/10/16   Historical Provider, MD  vitamin B-12 (CYANOCOBALAMIN) 1000 MCG tablet Take 1,000 mcg by mouth daily.    Historical Provider, MD    Allergies Tramadol; Beta adrenergic blockers; Celebrex [celecoxib]; Niacin; Niacin and related; Oxycodone; Shellfish allergy; Vicodin [hydrocodone-acetaminophen]; and Heparin  Family History  Problem Relation Age of Onset  . Heart disease Father   . Diabetes Other   . Breast cancer Neg Hx     Social History Social History  Substance Use Topics  . Smoking status: Never Smoker  . Smokeless tobacco: Never  Used  . Alcohol use 0.0 oz/week     Comment: 2-3 drinks per year    Review of Systems Constitutional: No fever/chills Eyes: No visual changes. ENT: No sore throat. No stiff neck no neck pain Cardiovascular: Denies chest pain. Respiratory: Denies shortness of breath. Gastrointestinal:   See history of present illness Genitourinary: Negative for dysuria. Musculoskeletal: Negative lower extremity swelling Skin: Negative for rash. Neurological: Negative for severe headaches, focal weakness or numbness. 10-point ROS otherwise negative.  ____________________________________________   PHYSICAL EXAM:  VITAL SIGNS: ED Triage Vitals  Enc Vitals Group     BP 05/17/16 1215 (!) 148/91     Pulse Rate 05/17/16 1215 (!) 112     Resp 05/17/16 1215 (!) 22     Temp 05/17/16 1215 98.3 F (36.8 C)     Temp Source 05/17/16 1215 Oral     SpO2 05/17/16 1215 100 %     Weight 05/17/16 1216 173 lb (78.5 kg)     Height 05/17/16 1216 5\' 2"  (1.575 m)     Head Circumference --      Peak Flow --      Pain Score 05/17/16 1217 0     Pain Loc --      Pain Edu? --       Excl. in [place]? --     Constitutional: Alert and oriented. Well appearing and in no acute distress.Anxious Eyes: Conjunctivae are normal. PERRL. EOMI. Head: Atraumatic. Nose: No congestion/rhinnorhea. Mouth/Throat: Mucous membranes are dry.  Oropharynx non-erythematous. Neck: No stridor.   Nontender with no meningismus Cardiovascular: Normal rate, regular rhythm. Grossly normal heart sounds.  Good peripheral circulation. Respiratory: Normal respiratory effort.  No retractions. Lungs CTAB. Abdominal: Soft and nontender. No distention. No guarding no rebound Back:  There is no focal tenderness or step off.  there is no midline tenderness there are no lesions noted. there is no CVA tenderness Musculoskeletal: No lower extremity tenderness, no upper extremity tenderness. No joint effusions, no DVT signs strong distal pulses no edema Neurologic:  Normal speech and language. No gross focal neurologic deficits are appreciated.  Skin:  Skin is warm, dry and intact. No rash noted. Psychiatric: Mood and affect are normal. Speech and behavior are normal.  ____________________________________________   LABS (all labs ordered are listed, but only abnormal results are displayed)  Labs Reviewed  COMPREHENSIVE METABOLIC PANEL - Abnormal; Notable for the following:       Result Value   Sodium 134 (*)    Chloride 100 (*)    Glucose, Bld 302 (*)    BUN 25 (*)    Creatinine, Ser 1.77 (*)    GFR calc non Af Amer 29 (*)    GFR calc Af Amer 33 (*)    All other components within normal limits  CBC WITH DIFFERENTIAL/PLATELET - Abnormal; Notable for the following:    RBC 3.79 (*)    Hemoglobin 11.9 (*)    HCT 34.3 (*)    RDW 14.7 (*)    All other components within normal limits  TSH   ____________________________________________  EKG  I personally interpreted any EKGs ordered by me or triage  ____________________________________________  RADIOLOGY  I reviewed any imaging ordered by me or  triage that were performed during my shift and, if possible, patient and/or family made aware of any abnormal findings. ____________________________________________   PROCEDURES  Procedure(s) performed: None  Procedures  Critical Care performed: None  ____________________________________________   INITIAL IMPRESSION / ASSESSMENT AND PLAN /  ED COURSE  Pertinent labs & imaging results that were available during my care of the patient were reviewed by me and considered in my medical decision making (see chart for details).  We have not seen this patient multiple times for the same complaint she has had outpatient workup and that does not include GI despite being referred to GI. Her creatinine is 123456 which is certainly up from surgery she does appear to be dehydrated she is mild to tachycardic. We're giving her IV fluids. I am concerned whether something we are missing. I discussed with Dr. Adonis Huguenin, of general surgery, and he absolutely does not feel further imaging is indicated at this time. He feels that a multidisciplinary approach to this patient's care would be of benefit. He also agrees with and recommends admission given elevated creatinine and weight loss. I have talked to the hospitalist service, they agree with this management and they will admit her to their service with a GI consult. Patient and family very relieved by this.  Clinical Course    ____________________________________________   FINAL CLINICAL IMPRESSION(S) / ED DIAGNOSES  Final diagnoses:  None      This chart was dictated using voice recognition software.  Despite best efforts to proofread,  errors can occur which can change meaning.      Schuyler Amor, MD 05/17/16 [phone]

## 2016-05-17 NOTE — ED Triage Notes (Signed)
Reports coming in several times over the past 8 weeks for the same thing.  Had hiatal hernia surgery 8 wks ago.  C/o constantly feeling hungry but once she sits up in the mornings she is too nauseated to eat.

## 2016-05-18 ENCOUNTER — Observation Stay: Payer: Medicare Other

## 2016-05-18 DIAGNOSIS — R112 Nausea with vomiting, unspecified: Secondary | ICD-10-CM

## 2016-05-18 DIAGNOSIS — E43 Unspecified severe protein-calorie malnutrition: Secondary | ICD-10-CM | POA: Insufficient documentation

## 2016-05-18 DIAGNOSIS — K8010 Calculus of gallbladder with chronic cholecystitis without obstruction: Secondary | ICD-10-CM | POA: Diagnosis not present

## 2016-05-18 LAB — COMPREHENSIVE METABOLIC PANEL
ALT: 20 U/L (ref 14–54)
ANION GAP: 6 (ref 5–15)
AST: 16 U/L (ref 15–41)
Albumin: 3.6 g/dL (ref 3.5–5.0)
Alkaline Phosphatase: 63 U/L (ref 38–126)
BUN: 18 mg/dL (ref 6–20)
CHLORIDE: 110 mmol/L (ref 101–111)
CO2: 22 mmol/L (ref 22–32)
Calcium: 8.3 mg/dL — ABNORMAL LOW (ref 8.9–10.3)
Creatinine, Ser: 1.54 mg/dL — ABNORMAL HIGH (ref 0.44–1.00)
GFR calc non Af Amer: 34 mL/min — ABNORMAL LOW (ref >60)
GFR, EST AFRICAN AMERICAN: 39 mL/min — AB (ref >60)
Glucose, Bld: 124 mg/dL — ABNORMAL HIGH (ref 65–99)
POTASSIUM: 4.1 mmol/L (ref 3.5–5.1)
SODIUM: 138 mmol/L (ref 135–145)
Total Bilirubin: 1.1 mg/dL (ref 0.3–1.2)
Total Protein: 6.2 g/dL — ABNORMAL LOW (ref 6.5–8.1)

## 2016-05-18 LAB — CBC
HEMATOCRIT: 28.3 % — AB (ref 35.0–47.0)
HEMOGLOBIN: 9.7 g/dL — AB (ref 12.0–16.0)
MCH: 31.3 pg (ref 26.0–34.0)
MCHC: 34.3 g/dL (ref 32.0–36.0)
MCV: 91.3 fL (ref 80.0–100.0)
Platelets: 194 10*3/uL (ref 150–440)
RBC: 3.1 MIL/uL — AB (ref 3.80–5.20)
RDW: 14.2 % (ref 11.5–14.5)
WBC: 5.7 10*3/uL (ref 3.6–11.0)

## 2016-05-18 LAB — BASIC METABOLIC PANEL
ANION GAP: 6 (ref 5–15)
BUN: 21 mg/dL — ABNORMAL HIGH (ref 6–20)
CALCIUM: 8.2 mg/dL — AB (ref 8.9–10.3)
CHLORIDE: 111 mmol/L (ref 101–111)
CO2: 22 mmol/L (ref 22–32)
Creatinine, Ser: 1.55 mg/dL — ABNORMAL HIGH (ref 0.44–1.00)
GFR calc Af Amer: 39 mL/min — ABNORMAL LOW (ref >60)
GFR calc non Af Amer: 34 mL/min — ABNORMAL LOW (ref >60)
GLUCOSE: 179 mg/dL — AB (ref 65–99)
Potassium: 4.4 mmol/L (ref 3.5–5.1)
Sodium: 139 mmol/L (ref 135–145)

## 2016-05-18 LAB — GLUCOSE, CAPILLARY
GLUCOSE-CAPILLARY: 185 mg/dL — AB (ref 65–99)
GLUCOSE-CAPILLARY: 123 mg/dL — AB (ref 65–99)
Glucose-Capillary: 111 mg/dL — ABNORMAL HIGH (ref 65–99)

## 2016-05-18 MED ORDER — TECHNETIUM TC 99M MEBROFENIN IV KIT
5.4300 | PACK | Freq: Once | INTRAVENOUS | Status: AC | PRN
  Administered 2016-05-18: 11:00:00 5.43 via INTRAVENOUS

## 2016-05-18 NOTE — Progress Notes (Signed)
Discussed with Dr Adonis Huguenin regarding positive HIDA scan, he will proceed with cholecytectomy. If nausea persists after surgery then will need to consider co existing gastroparesis.   I will sign off.  Please call me if any further GI concerns or questions.  We would like to thank you for the opportunity to participate in the care of Dorothy Rogers.   Dr Jonathon Bellows  Gastroenterology/Hepatology Pager: [phone]

## 2016-05-18 NOTE — Progress Notes (Signed)
Inpatient Diabetes Program Recommendations  AACE/ADA: New Consensus Statement on Inpatient Glycemic Control (2015)  Target Ranges:  Prepandial:   less than 140 mg/dL      Peak postprandial:   less than 180 mg/dL (1-2 hours)      Critically ill patients:  140 - 180 mg/dL   Results for JORGA, PERRICONE (MRN [id number]) as of 05/18/2016 09:38  Ref. Range 05/17/2016 20:44 05/18/2016 07:26  Glucose-Capillary Latest Ref Range: 65 - 99 mg/dL 189 (H) 185 (H)    Admit with: Intractable Nausea and Vomiting  History: DM, CKD  Home DM Meds: Lantus 10 units daily       Humalog 4-10 units TID per SSI       Glipizide 10 mg BID  Current Insulin Orders: Novolog Sensitive Correction Scale/ SSI (0-9 units) TID AC + HS        MD- Note patient refused Novolog dose this AM per SSI.  If you anticipate patient will have prolonged NPO period, please consider changing Novolog Sensitive Correction Scale/ SSI (0-9 units) to Q4 hour coverage (currently ordered TID AC + HS)      --Will follow patient during hospitalization--  Wyn Quaker RN, MSN, CDE Diabetes Coordinator Inpatient Glycemic Control Team Team Pager: [phone] (8a-5p)

## 2016-05-18 NOTE — Consult Note (Addendum)
Dorothy Bellows MD  [address] [place], [place] [address] Phone: [phone] Fax : [phone]  Consultation  Referring Provider:     No ref. provider found Primary Care Physician:  Idelle Crouch, MD Primary Gastroenterologist:  Dr. Vicente Males         Reason for Consultation:     Nausea and vomiting   Date of Admission:  05/17/2016 Date of Consultation:  05/18/2016         HPI:   Dorothy Rogers is a 65 y.o. female was seen in the ER on 05/05/16 for nausea,tachycardia,headaches -felt due to anxiety and discharged with GI cocktail and valium . Back in the ER on 05/09/16 for a panic attack - discharged hom to follow up with her doctor. She has had a lap paraesophageal hernia repair with fundoplication in [id number] , seen in the ER again on 05/12/16 and her symptoms resolved with phenergan and GI cocktail . Seen on 05/14/16 by Dr Dahlia Byes surgery barium swallow showed no leak of contrast , advised to start on compazine  She returned to the ER 05/17/16 with nausea without vomiting, found to be dehydrated  Barium swallow with tablet 05/13/16 shows mild narrowing of GE jn with passage of tablet , possible mild wall thickening .  CT abdomen 05/05/16 shows inflammatory changes surrounding Ge junction , cholelithiasis , rt middle lobe lung nodule.  EGD in 08/2015 by Dr Allen Norris for nausea/vomiting showed medium sized hiatal hernia, antral gastritis that was negative for H pylori on biopsy.  She has been followed by Dr Adonis Huguenin in the office after her surgery .   She says the nausea with no vomiting began after her hernia surgery, she has lost 15 lbs, at the the thought or smell of food triggers the nausea. She has absolutely no issues with swallowing , no vomiting . She feels hungry but the nausea makes it hard for her to eat . She says stress makes it worse but its the nausea which triggers her stress .  She has a bowel movement every few days and the nausea feels better after a bowel movement. No visual,  auditory or issues with balance. Some occasional headaches.  She says she is on Reglan  Past Medical History:  Diagnosis Date  . Anemia   . Anemia   . Arthritis    lower back, hips  . Blood transfusion without reported diagnosis   . CAD (coronary artery disease) FEB AND NOV 2009   6 STENTS  . 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 Cataract   . Chronic kidney disease    Stage III  . Coronary artery disease   . DDD (degenerative disc disease), lumbar   . Diabetes mellitus without complication ([place])    TYPE 2  . Herpes simplex   . Hyperlipidemia   . Hypertension    CONTROLLED ON MEDS  . Vertigo    occasional, no episodes in 2-3 months  . Vertigo    OCCASIONALLY  . Vitamin B 12 deficiency     Past Surgical History:  Procedure Laterality Date  . [place] STUDY N/A 02/04/2016   Procedure: [place] STUDY;  Surgeon: Lucilla Lame, MD;  Location: [facility] ENDOSCOPY;  Service: Endoscopy;  Laterality: N/A;  . ABDOMINAL HYSTERECTOMY    . BACK SURGERY     Lumbar spinal fusion 05/30/2015  . CARDIAC CATHETERIZATION  2009  . CATARACT EXTRACTION Left   . CATARACT EXTRACTION W/PHACO Right 03/19/2015   Procedure:  CATARACT EXTRACTION PHACO AND INTRAOCULAR LENS PLACEMENT (IOC);  Surgeon: Leandrew Koyanagi, MD;  Location: [facility];  Service: Ophthalmology;  Laterality: Right;  DIABETIC - insulin and oral meds  . CORONARY STENT PLACEMENT  03/2008  . CYSTECTOMY Right    wrist  . ESOPHAGEAL MANOMETRY N/A 02/04/2016   Procedure: ESOPHAGEAL MANOMETRY (EM);  Surgeon: Lucilla Lame, MD;  Location: [facility] END[facility];  Service: Endoscopy;  Laterality: N/A;  . ESOPHAGOGASTRODUODENOSCOPY (EGD) WITH PROPOFOL N/A 09/04/2015   Procedure: ESOPHAGOGASTRODUODENOSCOPY (EGD) WITH PROPOFOL;  Surgeon: Lucilla Lame, MD;  Location: [facility];  Service: Endoscopy;  Laterality: N/A;  INSULIN DEPENDENT DIABETIC  . EYE SURGERY Left    cataract extraction  . LUMBAR FUSION  2001, 2014,  2015  . [place]     2001, 2014, 2015  . TUMOR EXCISION     back of neck    Prior to Admission medications   Medication Sig Start Date End Date Taking? Authorizing Provider  atorvastatin (LIPITOR) 40 MG tablet Take 40 mg by mouth at bedtime.  08/16/14  Yes Historical Provider, MD  calcium-vitamin D (OSCAL WITH D) 500-200 MG-UNIT tablet Take 1 tablet by mouth daily with breakfast.   Yes Historical Provider, MD  clopidogrel (PLAVIX) 75 MG tablet Take 75 mg by mouth daily.   Yes Historical Provider, MD  diltiazem (CARDIZEM CD) 120 MG 24 hr capsule Take 1 capsule by mouth daily. 05/04/16 05/04/17 Yes Historical Provider, MD  ferrous sulfate 325 (65 FE) MG tablet Take 325 mg by mouth daily.   Yes Historical Provider, MD  glipiZIDE (GLUCOTROL) 10 MG tablet Take 10 mg by mouth 2 (two) times daily.   Yes Historical Provider, MD  insulin lispro (HUMALOG) 100 UNIT/ML injection Inject 4-10 Units into the skin 3 (three) times daily with meals. Based on sliding scale. Mid range.   Yes Historical Provider, MD  losartan (COZAAR) 100 MG tablet Take 100 mg by mouth every morning. AM 02/05/14  Yes Historical Provider, MD  ondansetron (ZOFRAN ODT) 8 MG disintegrating tablet Take 8 mg by mouth as needed for nausea or vomiting.   Yes Historical Provider, MD  pantoprazole (PROTONIX) 20 MG tablet Take 20 mg by mouth daily.   Yes Historical Provider, MD  prochlorperazine (COMPAZINE) 10 MG tablet Take 1 tablet (10 mg total) by mouth every 6 (six) hours as needed for nausea or vomiting. 05/14/16  Yes Diego F Pabon, MD  vitamin B-12 (CYANOCOBALAMIN) 1000 MCG tablet Take 1,000 mcg by mouth daily.   Yes Historical Provider, MD  acyclovir (ZOVIRAX) 400 MG tablet Take 400 mg by mouth as needed.  08/07/15   Historical Provider, MD  insulin glargine (LANTUS) 100 UNIT/ML injection Inject 10 Units into the skin daily.    Historical Provider, MD  LORazepam (ATIVAN) 1 MG tablet Take 1 tablet (1 mg total) by mouth every 6 (six)  hours as needed for anxiety. 05/09/16 05/09/17  Anne-Caroline Mariea Clonts, MD  promethazine (PHENERGAN) 12.5 MG tablet Take 1 tablet (12.5 mg total) by mouth every 6 (six) hours as needed for nausea or vomiting. 05/12/16   Schuyler Amor, MD  sertraline (ZOLOFT) 50 MG tablet Take 50 mg by mouth at bedtime. 05/10/16   Historical Provider, MD    Family History  Problem Relation Age of Onset  . Heart disease Father   . Diabetes Other   . Breast cancer Neg Hx      Social History  Substance Use Topics  . Smoking status: Never Smoker  .  Smokeless tobacco: Never Used  . Alcohol use 0.0 oz/week     Comment: 2-3 drinks per year    Allergies as of 05/17/2016 - Review Complete 05/17/2016  Allergen Reaction Noted  . Tramadol Swelling 03/11/2016  . Beta adrenergic blockers Hives 11/18/2014  . Celebrex [celecoxib] Other (See Comments) 11/18/2014  . Niacin Other (See Comments) 08/26/2015  . Niacin and related Other (See Comments) 11/18/2014  . Oxycodone Nausea Only 08/26/2015  . Shellfish allergy Nausea And Vomiting 11/18/2014  . Vicodin [hydrocodone-acetaminophen] Other (See Comments) 11/18/2014  . Heparin Rash 12/01/2015    Review of Systems:    All systems reviewed and negative except where noted in HPI.   Physical Exam:  Vital signs in last 24 hours: Temp:  [97.9 F (36.6 C)-98.3 F (36.8 C)] 98.2 F (36.8 C) (12/19 0408) Pulse Rate:  [69-112] 69 (12/19 0408) Resp:  [15-22] 18 (12/19 0408) BP: (109-153)/(46-91) 109/46 (12/19 0408) SpO2:  [97 %-100 %] 98 % (12/19 0408) Weight:  [173 lb (78.5 kg)] 173 lb (78.5 kg) (12/18 1216) Last BM Date: 05/17/16 General:   Pleasant, cooperative in NAD Head:  Normocephalic and atraumatic. Eyes:   No icterus.   Conjunctiva pink. PERRLA. Ears:  Normal auditory acuity. Neck:  Supple; no masses or thyroidomegaly Lungs: Respirations even and unlabored. Lungs clear to auscultation bilaterally.   No wheezes, crackles, or rhonchi.  Heart:  Regular  rate and rhythm;  Without murmur, clicks, rubs or gallops Abdomen:  Soft, nondistended, nontender. Normal bowel sounds. No appreciable masses or hepatomegaly.  No rebound or guarding.  Psych:  Alert and cooperative. Normal affect.  LAB RESULTS:  Recent Labs  05/17/16 1224 05/18/16 0419  WBC 6.7 5.7  HGB 11.9* 9.7*  HCT 34.3* 28.3*  PLT 251 194   BMET  Recent Labs  05/17/16 1224 05/18/16 0419  NA 134* 139  K 4.5 4.4  CL 100* 111  CO2 23 22  GLUCOSE 302* 179*  BUN 25* 21*  CREATININE 1.77* 1.55*  CALCIUM 9.1 8.2*   LFT  Recent Labs  05/17/16 1224  PROT 7.7  ALBUMIN 4.5  AST 21  ALT 26  ALKPHOS 79  BILITOT 1.2   PT/INR No results for input(s): LABPROT, INR in the last 72 hours.  STUDIES: No results found.    Impression / Plan:   Dorothy Rogers is a 65 y.o. y/o female with nausea since she had a recent hernia repair. HIDA scan shows non visualization of the gall bladder, she has absolutely no abdominal tenderness, her only symptoms is abdominal pain . She also has constipation .   Plan:  1. Surgery is following and if felt gall bladder is the issue then will need a cholecystectomy  2. Continue Reglan which she has taken in the OP to help with gastric motility 3. Continue antiemetics.   4. Elevated blood sugars may contribute to gastroparesis , if surgery does not fix the issue then further evaluation including EGD, gastric emptying study as well as MRI brain will be considered  Thank you for involving me in the care of this patient.      LOS: 0 days   Dorothy Bellows, MD  05/18/2016, 8:34 AM

## 2016-05-18 NOTE — Consult Note (Signed)
Patient ID: Dorothy Rogers, female   DOB: 04/17/1951, 65 y.o.   MRN: [id number]  CC: Persistent nausea and anorexia  HPI Dorothy Rogers is a 65 y.o. female well-known to the surgery service as she is 2 months status post a laparoscopic paraesophageal hernia repair performed by myself. Surgery consult requested by Dr. Bridgett Larsson due to persistent nausea and anorexia. Patient provides an inconsistent history as she is unable to truly identify what causes her nausea what makes it worse. She states that when she eats she is able force food down but she has persistent nausea. Her primary complaint of causes of nausea his activity. She's had numerous outpatient workups including CT scans and swallow studies which do not show any breakdown from the antireflux repair. She states that her rapid breathing and chest discomfort has resolved and that she's not been having reflux or indigestion since the surgery that she had before. She currently denies any fevers, chills, chest pain, shortness of breath, diarrhea. She has been having constipation, nausea but no vomiting.  HPI  Past Medical History:  Diagnosis Date  . Anemia   . Anemia   . Arthritis    lower back, hips  . Blood transfusion without reported diagnosis   . CAD (coronary artery disease) FEB AND NOV 2009   6 STENTS  . Cancer ([place])    tumor back of neck-fibroushistocytoma  . Cataract   . Chronic kidney disease    Stage III  . Coronary artery disease   . DDD (degenerative disc disease), lumbar   . Diabetes mellitus without complication ([place])    TYPE 2  . Herpes simplex   . Hyperlipidemia   . Hypertension    CONTROLLED ON MEDS  . Vertigo    occasional, no episodes in 2-3 months  . Vertigo    OCCASIONALLY  . Vitamin B 12 deficiency     Past Surgical History:  Procedure Laterality Date  . [place] STUDY N/A 02/04/2016   Procedure: [place] STUDY;  Surgeon: Lucilla Lame, MD;  Location: [facility];  Service: Endoscopy;  Laterality:  N/A;  . ABDOMINAL HYSTERECTOMY    . BACK SURGERY     Lumbar spinal fusion 05/30/2015  . CARDIAC CATHETERIZATION  2009  . CATARACT EXTRACTION Left   . CATARACT EXTRACTION W/PHACO Right 03/19/2015   Procedure: CATARACT EXTRACTION PHACO AND INTRAOCULAR LENS PLACEMENT ([place]);  Surgeon: Leandrew Koyanagi, MD;  Location: [facility];  Service: Ophthalmology;  Laterality: Right;  DIABETIC - insulin and oral meds  . CORONARY STENT PLACEMENT  03/2008  . CYSTECTOMY Right    wrist  . ESOPHAGEAL MANOMETRY N/A 02/04/2016   Procedure: ESOPHAGEAL MANOMETRY (EM);  Surgeon: Lucilla Lame, MD;  Location: [facility];  Service: Endoscopy;  Laterality: N/A;  . ESOPHAGOGASTRODUODENOSCOPY (EGD) WITH PROPOFOL N/A 09/04/2015   Procedure: ESOPHAGOGASTRODUODENOSCOPY (EGD) WITH PROPOFOL;  Surgeon: Lucilla Lame, MD;  Location: [facility];  Service: Endoscopy;  Laterality: N/A;  INSULIN DEPENDENT DIABETIC  . EYE SURGERY Left    cataract extraction  . LUMBAR FUSION  2001, 2014, 2015  . [place]     2001, 2014, 2015  . TUMOR EXCISION     back of neck    Family History  Problem Relation Age of Onset  . Heart disease Father   . Diabetes Other   . Breast cancer Neg Hx     Social History Social History  Substance Use Topics  . Smoking status: Never Smoker  . Smokeless  tobacco: Never Used  . Alcohol use 0.0 oz/week     Comment: 2-3 drinks per year    Allergies  Allergen Reactions  . Tramadol Swelling  . Beta Adrenergic Blockers Hives  . Celebrex [Celecoxib] Other (See Comments)    A lot of pressure in head  . Niacin Other (See Comments)    flushing  . Niacin And Related Other (See Comments)    Flushing and hot sensation  . Oxycodone Nausea Only  . Shellfish Allergy Nausea And Vomiting  . Vicodin [Hydrocodone-Acetaminophen] Other (See Comments)    Hypoglycemic episodes  . Heparin Rash    Current Facility-Administered Medications  Medication Dose Route Frequency Provider Last  Rate Last Dose  . 0.9 % NaCl with KCl 20 mEq/ L  infusion   Intravenous Continuous Demetrios Loll, MD 75 mL/hr at 05/18/16 0705    . atorvastatin (LIPITOR) tablet 40 mg  40 mg Oral QHS Demetrios Loll, MD   40 mg at 05/17/16 2159  . diltiazem (CARDIZEM CD) 24 hr capsule 120 mg  120 mg Oral Daily Demetrios Loll, MD   120 mg at 05/18/16 0910  . ferrous sulfate tablet 325 mg  325 mg Oral Daily Demetrios Loll, MD   325 mg at 05/18/16 0910  . insulin aspart (novoLOG) injection 0-5 Units  0-5 Units Subcutaneous QHS Demetrios Loll, MD      . insulin aspart (novoLOG) injection 0-9 Units  0-9 Units Subcutaneous TID WC Demetrios Loll, MD      . LORazepam (ATIVAN) tablet 1 mg  1 mg Oral Q6H PRN Demetrios Loll, MD      . losartan (COZAAR) tablet 100 mg  100 mg Oral q morning - 10a Demetrios Loll, MD   100 mg at 05/18/16 0910  . ondansetron (ZOFRAN) tablet 4 mg  4 mg Oral Q6H PRN Demetrios Loll, MD       Or  . ondansetron [facility]) injection 4 mg  4 mg Intravenous Q6H PRN Demetrios Loll, MD      . pantoprazole (PROTONIX) EC tablet 40 mg  40 mg Oral BID Demetrios Loll, MD   40 mg at 05/18/16 0910  . prochlorperazine (COMPAZINE) tablet 10 mg  10 mg Oral Q6H PRN Demetrios Loll, MD      . sertraline (ZOLOFT) tablet 50 mg  50 mg Oral QHS Demetrios Loll, MD         Review of Systems A Multi-point review of systems was asked and was negative except for the findings documented in the history of present illness  Physical Exam Blood pressure (!) 137/53, pulse 70, temperature 98.2 F (36.8 C), temperature source Oral, resp. rate 18, height 5\' 2"  (1.575 m), weight 78.5 kg (173 lb), SpO2 98 %. CONSTITUTIONAL: No acute distress. EYES: Pupils are equal, round, and reactive to light, Sclera are non-icteric. EARS, NOSE, MOUTH AND THROAT: The oropharynx is clear. The oral mucosa is pink and moist. Hearing is intact to voice. LYMPH NODES:  Lymph nodes in the neck are normal. RESPIRATORY:  Lungs are clear. There is normal respiratory effort, with equal breath sounds bilaterally, and  without pathologic use of accessory muscles. CARDIOVASCULAR: Heart is regular without murmurs, gallops, or rubs. GI: The abdomen is soft, nontender, and nondistended. There are no palpable masses. There is no hepatosplenomegaly. There are normal bowel sounds in all quadrants. GU: Rectal deferred.   MUSCULOSKELETAL: Normal muscle strength and tone. No cyanosis or edema.   SKIN: Turgor is good and there are no pathologic skin lesions  or ulcers. NEUROLOGIC: Motor and sensation is grossly normal. Cranial nerves are grossly intact. PSYCH:  Oriented to person, place and time. Affect is normal.  Data Reviewed Images and labs reviewed, labs show a normal white blood cell count 6.7, mildly elevated creatinine of 1.55 but other electrolytes per really within normal limits. Most recent CT scan and swallow study showed an intact fundoplication without evidence of stricture or slipping of the wrap. I have personally reviewed the patient's imaging, laboratory findings and medical records.    Assessment    Persistent nausea and anorexia    Plan    65 year old female that is 2 months status post laparoscopic paraesophageal hernia repair. Her persistent nausea and anorexia is difficult to diagnose and treat due to the patient. Discussed getting a GI evaluation while she is in the hospital. Patient also is known to have gallstones and therefore ruling out a biliary source for her nausea is not unreasonable. At current, there is no indication for any surgical intervention but a HIDA scan will be obtained to further work up her biliary system. Surgery will continue to follow with you.     Time spent with the patient was 80 minutes, with more than 50% of the time spent in face-to-face education, counseling and care coordination.     Clayburn Pert, MD FACS General Surgeon 05/18/2016, 9:41 AM

## 2016-05-18 NOTE — Plan of Care (Signed)
Problem: Education: Goal: Knowledge of [facility] Education information/materials will improve Outcome: Progressing Pt likes to be called Dorothy Rogers  Past Medical History:  Diagnosis Date  . Anemia   . Anemia   . Arthritis    lower back, hips  . Blood transfusion without reported diagnosis   . CAD (coronary artery disease) FEB AND NOV 2009   6 STENTS  . 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 Cataract   . Chronic kidney disease    Stage III  . Coronary artery disease   . DDD (degenerative disc disease), lumbar   . Diabetes mellitus without complication ([place])    TYPE 2  . Herpes simplex   . Hyperlipidemia   . Hypertension    CONTROLLED ON MEDS  . Vertigo    occasional, no episodes in 2-3 months  . Vertigo    OCCASIONALLY  . Vitamin B 12 deficiency    Pt is well controlled with home medications

## 2016-05-18 NOTE — Progress Notes (Signed)
Initial Nutrition Assessment  DOCUMENTATION CODES:   Severe malnutrition in context of acute illness/injury, Obesity unspecified  INTERVENTION:  Encouraged small, frequent meals when diet advanced. Discussed importance of "eating by the clock" to meet calorie and protein needs and prevent further weight loss.  Recommend Glucerna Shake po BID when diet advanced, each supplement provides 220 kcal and 10 grams of protein. Will explain to patient that chocolate Glucerna does not have any caffeine.  Recommend Premier Protein po once daily when diet advance, each supplement provides 100 kcal and 30 grams of protein.  NUTRITION DIAGNOSIS:   Inadequate oral intake related to nausea, poor appetite as evidenced by per patient/family report, 7 percent weight loss over 2 months.  GOAL:   Patient will meet greater than or equal to 90% of their needs  MONITOR:   PO intake, Supplement acceptance, Diet advancement, Labs, Weight trends, I & O's  REASON FOR ASSESSMENT:   Malnutrition Screening Tool    ASSESSMENT:   65 y.o. female with a known history of Hypertension, CKD stage III, CAD, diabetes and anemia. She is s/p laparoscopic Paraesophageal hernia repair with nissen fundoplication on [id number]. She developed nausea and vomiting after surgery. She came to the ED multiple times, was given Zofran and Phenergan and sent home. But she still has intractable nausea and vomiting.   Spoke with patient and husband at bedside. Patient reports she has had poor appetite, lack of hunger (anorexia), and nausea since surgery in October. Patient clarified she has not been vomiting, only experiencing nausea. She reports she was on a liquid diet for a few weeks after the operation and was drinking chocolate Glucerna, but reports difficulty with the caffeine in chocolate after a certain time of day (this RD not aware of any caffeine content in Chocolate Glucerna as not reported on label). She can only tolerate 2-3  small meals of soup or other soft foods or liquids per day.  Reports UBW of 185 lbs. Patient has lost 13 lbs (7% body weight) over 2 months, which is significant for time frame.  Medications reviewed and include: ferrous sulfate 325 mg daily, Novolog sliding scale TID with meals and daily HS, pantoprazole, NS with KCl 20 mEq/L @ 75 ml/hr.  Labs reviewed: CBG 185-189, BUN 21, Creatinine 1.55.  Nutrition-Focused physical exam completed. Findings are no fat depletion, no muscle depletion, and no edema.  Patient meets criteria for severe acute malnutrition in setting of 7% weight loss over 2 months, intake </= 50% of estimated energy requirements for >/= 5 days.  Discussed with RN. Patient NPO since midnight for HIDA scan.  Diet Order:  Diet NPO time specified Except for: Sips with Meds  Skin:  Reviewed, no issues  Last BM:  05/17/2016  Height:   Ht Readings from Last 1 Encounters:  05/17/16 5\' 2"  (1.575 m)    Weight:   Wt Readings from Last 1 Encounters:  05/17/16 173 lb (78.5 kg)    Ideal Body Weight:  50 kg  BMI:  Body mass index is 31.64 kg/m.  Estimated Nutritional Needs:   Kcal:  1540-1800 (MSJ x 1.2-1.4)  Protein:  63-80 grams (0.8-1 grams/kg)  Fluid:  >/= 1.9 L/day (25 ml/kg)  EDUCATION NEEDS:   Education needs addressed (Discussed small, frequent meals.)  Willey Blade, MS, RD, LDN Pager: [phone] After Hours Pager: [phone]

## 2016-05-18 NOTE — Progress Notes (Signed)
[place] [facility] [place] NAME: Dorothy Rogers    MR#:  [id number]  DATE OF BIRTH:  03-Aug-1950  SUBJECTIVE:  CHIEF COMPLAINT:  Patient's nausea and vomiting improved. Denies any abdominal pain. Denies any diarrhea  REVIEW OF SYSTEMS:  CONSTITUTIONAL: No fever, fatigue or weakness.  EYES: No blurred or double vision.  EARS, NOSE, AND THROAT: No tinnitus or ear pain.  RESPIRATORY: No cough, shortness of breath, wheezing or hemoptysis.  CARDIOVASCULAR: No chest pain, orthopnea, edema.  GASTROINTESTINAL: nausea, vomitingBetter ,  no diarrhea or abdominal pain.  GENITOURINARY: No dysuria, hematuria.  ENDOCRINE: No polyuria, nocturia,  HEMATOLOGY: No anemia, easy bruising or bleeding SKIN: No rash or lesion. MUSCULOSKELETAL: No joint pain or arthritis.   NEUROLOGIC: No tingling, numbness, weakness.  PSYCHIATRY: No anxiety or depression.   DRUG ALLERGIES:   Allergies  Allergen Reactions  . Tramadol Swelling  . Beta Adrenergic Blockers Hives  . Celebrex [Celecoxib] Other (See Comments)    A lot of pressure in head  . Niacin Other (See Comments)    flushing  . Niacin And Related Other (See Comments)    Flushing and hot sensation  . Oxycodone Nausea Only  . Shellfish Allergy Nausea And Vomiting  . Vicodin [Hydrocodone-Acetaminophen] Other (See Comments)    Hypoglycemic episodes  . Heparin Rash    VITALS:  Blood pressure (!) 126/54, pulse 64, temperature 98.2 F (36.8 C), temperature source Oral, resp. rate 18, height 5\' 2"  (1.575 m), weight 78.5 kg (173 lb), SpO2 100 %.  PHYSICAL EXAMINATION:  GENERAL:  65 y.o.-year-old patient lying in the bed with no acute distress.  EYES: Pupils equal, round, reactive to light and accommodation. No scleral icterus. Extraocular muscles intact.  HEENT: Head atraumatic, normocephalic. Oropharynx and nasopharynx clear.  NECK:  Supple, no jugular venous distention. No thyroid enlargement, no  tenderness.  LUNGS: Normal breath sounds bilaterally, no wheezing, rales,rhonchi or crepitation. No use of accessory muscles of respiration.  CARDIOVASCULAR: S1, S2 normal. No murmurs, rubs, or gallops.  ABDOMEN: Soft, nontender, nondistended.  laparoscopic scars are healing well Bowel sounds present. No organomegaly or mass.  EXTREMITIES: No pedal edema, cyanosis, or clubbing.  NEUROLOGIC: Cranial nerves II through XII are intact. Muscle strength 5/5 in all extremities. Sensation intact. Gait not checked.  PSYCHIATRIC: The patient is alert and oriented x 3.  SKIN: No obvious rash, lesion, or ulcer.    LABORATORY PANEL:   CBC  Recent Labs Lab 05/18/16 0419  WBC 5.7  HGB 9.7*  HCT 28.3*  PLT 194   ------------------------------------------------------------------------------------------------------------------  Chemistries   Recent Labs Lab 05/17/16 1224 05/18/16 0419  NA 134* 139  K 4.5 4.4  CL 100* 111  CO2 23 22  GLUCOSE 302* 179*  BUN 25* 21*  CREATININE 1.77* 1.55*  CALCIUM 9.1 8.2*  MG 2.0  --   AST 21  --   ALT 26  --   ALKPHOS 79  --   BILITOT 1.2  --    ------------------------------------------------------------------------------------------------------------------  Cardiac Enzymes  Recent Labs Lab 05/12/16 1030  TROPONINI <0.03   ------------------------------------------------------------------------------------------------------------------  RADIOLOGY:  No results found.  EKG:   Orders placed or performed during the hospital encounter of 05/12/16  . ED EKG  . ED EKG  . EKG 12-Lead  . EKG 12-Lead    ASSESSMENT AND PLAN:   # Intractable nausea and vomiting- HIDA scan is done and results are pending  Zofran and Phenergan when necessary, IV fluid  support,  Supportive treatment  GI and surgery consult. Appreciate surgery recommendations Patient is a status post laparoscopic paraesophageal hernia repair 2 months ago, scars healing  well Nothing by mouth after midnight for possible further workup by specialist.  Hold aspirin and Plavix.  # ARF on CKD stage III.  Provide IV fluids  Creatinine 1.77-1.55  TSH is normal  # Hyponatremia. Sodium at 139 improved with Normal saline IV.  #Hypertension. Continue hypertension medication.  #Diabetes. sliding scale   hold Lantus due to poor oral intake secondary to nausea and vomiting.      All the records are reviewed and case discussed with Care Management/Social Workerr. Management plans discussed with the patient, family and they are in agreement.  CODE STATUS: fc   TOTAL TIME TAKING CARE OF THIS PATIENT: 36  minutes.   POSSIBLE D/C IN 2  DAYS, DEPENDING ON CLINICAL CONDITION.  Note: This dictation was prepared with Dragon dictation along with smaller phrase technology. Any transcriptional errors that result from this process are unintentional.   Nicholes Mango M.D on 05/18/2016 at 2:28 PM  Between 7am to 6pm - Pager - [phone] After 6pm go to [email or web address] - password [email or web address] [facility]  Office  [phone]  CC: Primary care physician; Idelle Crouch, MD

## 2016-05-19 ENCOUNTER — Encounter: Payer: Self-pay | Admitting: *Deleted

## 2016-05-19 ENCOUNTER — Ambulatory Visit: Payer: Medicare Other | Admitting: General Surgery

## 2016-05-19 ENCOUNTER — Encounter
Admission: EM | Disposition: A | Discharge status: Home / Self Care | Payer: Self-pay | Source: Home / Self Care | Attending: Emergency Medicine

## 2016-05-19 ENCOUNTER — Observation Stay: Payer: Medicare Other | Admitting: Registered Nurse

## 2016-05-19 DIAGNOSIS — R112 Nausea with vomiting, unspecified: Secondary | ICD-10-CM | POA: Diagnosis not present

## 2016-05-19 DIAGNOSIS — K8020 Calculus of gallbladder without cholecystitis without obstruction: Secondary | ICD-10-CM

## 2016-05-19 DIAGNOSIS — K8010 Calculus of gallbladder with chronic cholecystitis without obstruction: Secondary | ICD-10-CM | POA: Diagnosis not present

## 2016-05-19 DIAGNOSIS — K8021 Calculus of gallbladder without cholecystitis with obstruction: Secondary | ICD-10-CM | POA: Diagnosis not present

## 2016-05-19 HISTORY — PX: CHOLECYSTECTOMY: SHX55

## 2016-05-19 LAB — GLUCOSE, CAPILLARY
GLUCOSE-CAPILLARY: 153 mg/dL — AB (ref 65–99)
Glucose-Capillary: 177 mg/dL — ABNORMAL HIGH (ref 65–99)
Glucose-Capillary: 168 mg/dL — ABNORMAL HIGH (ref 65–99)
Glucose-Capillary: 174 mg/dL — ABNORMAL HIGH (ref 65–99)

## 2016-05-19 LAB — COMPREHENSIVE METABOLIC PANEL
ALBUMIN: 3.9 g/dL (ref 3.5–5.0)
ALK PHOS: 70 U/L (ref 38–126)
ALT: 53 U/L (ref 14–54)
AST: 55 U/L — AB (ref 15–41)
Anion gap: 9 (ref 5–15)
BUN: 16 mg/dL (ref 6–20)
CHLORIDE: 110 mmol/L (ref 101–111)
CO2: 19 mmol/L — AB (ref 22–32)
CREATININE: 1.48 mg/dL — AB (ref 0.44–1.00)
Calcium: 8.5 mg/dL — ABNORMAL LOW (ref 8.9–10.3)
GFR calc non Af Amer: 36 mL/min — ABNORMAL LOW (ref >60)
GFR, EST AFRICAN AMERICAN: 41 mL/min — AB (ref >60)
GLUCOSE: 182 mg/dL — AB (ref 65–99)
Potassium: 5 mmol/L (ref 3.5–5.1)
SODIUM: 138 mmol/L (ref 135–145)
Total Bilirubin: 1.4 mg/dL — ABNORMAL HIGH (ref 0.3–1.2)
Total Protein: 7 g/dL (ref 6.5–8.1)

## 2016-05-19 SURGERY — LAPAROSCOPIC CHOLECYSTECTOMY
Anesthesia: General | Wound class: Clean Contaminated

## 2016-05-19 MED ORDER — PHENYLEPHRINE HCL 10 MG/ML IJ SOLN
INTRAMUSCULAR | Status: DC | PRN
  Administered 2016-05-19 (×2): 80 ug via INTRAVENOUS
  Administered 2016-05-19: 40 ug via INTRAVENOUS

## 2016-05-19 MED ORDER — ONDANSETRON HCL 4 MG/2ML IJ SOLN
INTRAMUSCULAR | Status: AC
  Filled 2016-05-19: qty 2

## 2016-05-19 MED ORDER — LIDOCAINE HCL (CARDIAC) 20 MG/ML IV SOLN
INTRAVENOUS | Status: DC | PRN
  Administered 2016-05-19: 80 mg via INTRAVENOUS

## 2016-05-19 MED ORDER — FENTANYL CITRATE (PF) 100 MCG/2ML IJ SOLN
INTRAMUSCULAR | Status: AC
  Filled 2016-05-19: qty 2

## 2016-05-19 MED ORDER — PROPOFOL 10 MG/ML IV BOLUS
INTRAVENOUS | Status: AC
  Filled 2016-05-19: qty 20

## 2016-05-19 MED ORDER — HYDROCODONE-ACETAMINOPHEN 5-325 MG PO TABS
1.0000 | ORAL_TABLET | ORAL | Status: DC | PRN
Start: 2016-05-19 — End: 2016-05-19

## 2016-05-19 MED ORDER — BUPIVACAINE HCL (PF) 0.5 % IJ SOLN
INTRAMUSCULAR | Status: AC
  Filled 2016-05-19: qty 30

## 2016-05-19 MED ORDER — MORPHINE SULFATE (PF) 4 MG/ML IV SOLN
2.0000 mg | Freq: Four times a day (QID) | INTRAVENOUS | Status: DC | PRN
  Administered 2016-05-19: 2 mg via INTRAVENOUS
  Filled 2016-05-19: qty 1

## 2016-05-19 MED ORDER — PROPOFOL 10 MG/ML IV BOLUS
INTRAVENOUS | Status: DC | PRN
  Administered 2016-05-19: 120 mg via INTRAVENOUS

## 2016-05-19 MED ORDER — ACETAMINOPHEN-CODEINE #3 300-30 MG PO TABS
1.0000 | ORAL_TABLET | ORAL | Status: DC | PRN
  Administered 2016-05-19: 2 via ORAL
  Filled 2016-05-19: qty 2
  Filled 2016-05-19: qty 1

## 2016-05-19 MED ORDER — LIDOCAINE 2% (20 MG/ML) 5 ML SYRINGE
INTRAMUSCULAR | Status: AC
  Filled 2016-05-19: qty 5

## 2016-05-19 MED ORDER — CHLORHEXIDINE GLUCONATE CLOTH 2 % EX PADS: 6.0000 | MEDICATED_PAD | Freq: Once | CUTANEOUS | Status: DC

## 2016-05-19 MED ORDER — SUGAMMADEX SODIUM 200 MG/2ML IV SOLN
INTRAVENOUS | Status: AC
  Filled 2016-05-19: qty 2

## 2016-05-19 MED ORDER — MIDAZOLAM HCL 2 MG/2ML IJ SOLN
INTRAMUSCULAR | Status: DC | PRN
  Administered 2016-05-19: 2 mg via INTRAVENOUS

## 2016-05-19 MED ORDER — ROCURONIUM BROMIDE 100 MG/10ML IV SOLN
INTRAVENOUS | Status: DC | PRN
  Administered 2016-05-19: 10 mg via INTRAVENOUS
  Administered 2016-05-19: 35 mg via INTRAVENOUS

## 2016-05-19 MED ORDER — KETOROLAC TROMETHAMINE 30 MG/ML IJ SOLN
INTRAMUSCULAR | Status: AC
  Filled 2016-05-19: qty 1

## 2016-05-19 MED ORDER — LIDOCAINE HCL 1 % IJ SOLN
INTRAMUSCULAR | Status: DC | PRN
  Administered 2016-05-19: 29 mL
  Administered 2016-05-19: 11:00:00

## 2016-05-19 MED ORDER — CEFAZOLIN SODIUM 1 G IJ SOLR
INTRAMUSCULAR | Status: DC | PRN
  Administered 2016-05-19: 2 g via INTRAMUSCULAR

## 2016-05-19 MED ORDER — FENTANYL CITRATE (PF) 100 MCG/2ML IJ SOLN
INTRAMUSCULAR | Status: DC | PRN
  Administered 2016-05-19: 100 ug via INTRAVENOUS
  Administered 2016-05-19: 50 ug via INTRAVENOUS
  Administered 2016-05-19 (×2): 25 ug via INTRAVENOUS

## 2016-05-19 MED ORDER — LIDOCAINE HCL (PF) 1 % IJ SOLN
INTRAMUSCULAR | Status: AC
  Filled 2016-05-19: qty 30

## 2016-05-19 MED ORDER — ROCURONIUM BROMIDE 10 MG/ML (PF) SYRINGE
PREFILLED_SYRINGE | INTRAVENOUS | Status: AC
  Filled 2016-05-19: qty 10

## 2016-05-19 MED ORDER — PHENYLEPHRINE 40 MCG/ML (10ML) SYRINGE FOR IV PUSH (FOR BLOOD PRESSURE SUPPORT)
PREFILLED_SYRINGE | INTRAVENOUS | Status: AC
  Filled 2016-05-19: qty 10

## 2016-05-19 MED ORDER — ASPIRIN EC 81 MG PO TBEC
81.0000 mg | DELAYED_RELEASE_TABLET | Freq: Every day | ORAL | Status: DC
  Administered 2016-05-19 – 2016-05-20 (×2): 81 mg via ORAL
  Filled 2016-05-19 (×2): qty 1

## 2016-05-19 MED ORDER — DEXAMETHASONE SODIUM PHOSPHATE 10 MG/ML IJ SOLN
INTRAMUSCULAR | Status: AC
  Filled 2016-05-19: qty 1

## 2016-05-19 MED ORDER — ONDANSETRON HCL 4 MG/2ML IJ SOLN
INTRAMUSCULAR | Status: DC | PRN
  Administered 2016-05-19: 4 mg via INTRAVENOUS

## 2016-05-19 MED ORDER — FENTANYL CITRATE (PF) 100 MCG/2ML IJ SOLN: 25.0000 ug | INTRAMUSCULAR | Status: DC | PRN

## 2016-05-19 MED ORDER — SUGAMMADEX SODIUM 200 MG/2ML IV SOLN
INTRAVENOUS | Status: DC | PRN
  Administered 2016-05-19: 150 mg via INTRAVENOUS

## 2016-05-19 MED ORDER — CHLORHEXIDINE GLUCONATE CLOTH 2 % EX PADS
6.0000 | MEDICATED_PAD | Freq: Once | CUTANEOUS | Status: AC
  Administered 2016-05-19: 6 via TOPICAL

## 2016-05-19 MED ORDER — ACETAMINOPHEN 10 MG/ML IV SOLN
INTRAVENOUS | Status: AC
  Filled 2016-05-19: qty 100

## 2016-05-19 MED ORDER — CEFAZOLIN SODIUM 1 G IJ SOLR
INTRAMUSCULAR | Status: AC
  Filled 2016-05-19: qty 20

## 2016-05-19 MED ORDER — HEPARIN SODIUM (PORCINE) 5000 UNIT/ML IJ SOLN
5000.0000 [IU] | Freq: Three times a day (TID) | INTRAMUSCULAR | Status: DC
  Filled 2016-05-19: qty 1

## 2016-05-19 MED ORDER — MIDAZOLAM HCL 2 MG/2ML IJ SOLN
INTRAMUSCULAR | Status: AC
  Filled 2016-05-19: qty 2

## 2016-05-19 MED ORDER — SODIUM CHLORIDE 0.9 % IV SOLN
INTRAVENOUS | Status: DC | PRN
  Administered 2016-05-19: 10:00:00 via INTRAVENOUS

## 2016-05-19 SURGICAL SUPPLY — 44 items
ADHESIVE MASTISOL STRL (MISCELLANEOUS) ×3 IMPLANT
APPLIER CLIP ROT 10 11.4 M/L (STAPLE) ×3
BAG COUNTER SPONGE EZ (MISCELLANEOUS) IMPLANT
BLADE SURG SZ11 CARB STEEL (BLADE) ×3 IMPLANT
CANISTER SUCT 1200ML W/VALVE (MISCELLANEOUS) ×3 IMPLANT
CATH CHOLANG 76X19 KUMAR (CATHETERS) ×3 IMPLANT
CHLORAPREP W/TINT 26ML (MISCELLANEOUS) ×3 IMPLANT
CLIP APPLIE ROT 10 11.4 M/L (STAPLE) ×1 IMPLANT
CLOSURE WOUND 1/2 X4 (GAUZE/BANDAGES/DRESSINGS) ×1
CONRAY 60ML FOR OR (MISCELLANEOUS) IMPLANT
COUNTER SPONGE BAG EZ (MISCELLANEOUS)
DRAPE SHEET LG 3/4 BI-LAMINATE (DRAPES) ×3 IMPLANT
DRESSING TELFA 4X3 1S ST N-ADH (GAUZE/BANDAGES/DRESSINGS) ×3 IMPLANT
DRSG TEGADERM 2-3/8X2-3/4 SM (GAUZE/BANDAGES/DRESSINGS) ×3 IMPLANT
DRSG TELFA 3X8 NADH (GAUZE/BANDAGES/DRESSINGS) ×3 IMPLANT
ELECT REM PT RETURN 9FT ADLT (ELECTROSURGICAL) ×3
ELECTRODE REM PT RTRN 9FT ADLT (ELECTROSURGICAL) ×1 IMPLANT
GLOVE BIO SURGEON STRL SZ7.5 (GLOVE) ×3 IMPLANT
GLOVE INDICATOR 8.0 STRL GRN (GLOVE) ×3 IMPLANT
GOWN STRL REUS W/ TWL LRG LVL3 (GOWN DISPOSABLE) ×3 IMPLANT
GOWN STRL REUS W/TWL LRG LVL3 (GOWN DISPOSABLE) ×6
GRASPER SUT TROCAR 14GX15 (MISCELLANEOUS) ×3 IMPLANT
IRRIGATION STRYKERFLOW (MISCELLANEOUS) ×1 IMPLANT
IRRIGATOR STRYKERFLOW (MISCELLANEOUS) ×3
IV NS 1000ML (IV SOLUTION) ×2
IV NS 1000ML BAXH (IV SOLUTION) ×1 IMPLANT
L-HOOK LAP DISP 36CM (ELECTROSURGICAL) ×3
LABEL OR SOLS (LABEL) ×3 IMPLANT
LHOOK LAP DISP 36CM (ELECTROSURGICAL) ×1 IMPLANT
NEEDLE HYPO 25X1 1.5 SAFETY (NEEDLE) ×3 IMPLANT
NEEDLE VERESS 14GA 120MM (NEEDLE) ×3 IMPLANT
NS IRRIG 500ML POUR BTL (IV SOLUTION) ×3 IMPLANT
PACK LAP CHOLECYSTECTOMY (MISCELLANEOUS) ×3 IMPLANT
PENCIL ELECTRO HAND CTR (MISCELLANEOUS) ×3 IMPLANT
POUCH ENDO CATCH 10MM SPEC (MISCELLANEOUS) ×3 IMPLANT
SCISSORS METZENBAUM CVD 33 (INSTRUMENTS) ×3 IMPLANT
SLEEVE ENDOPATH XCEL 5M (ENDOMECHANICALS) ×6 IMPLANT
STRIP CLOSURE SKIN 1/2X4 (GAUZE/BANDAGES/DRESSINGS) ×2 IMPLANT
SUT MNCRL 4-0 (SUTURE) ×2
SUT MNCRL 4-0 27XMFL (SUTURE) ×1
SUTURE MNCRL 4-0 27XMF (SUTURE) ×1 IMPLANT
TROCAR XCEL 12X100 BLDLESS (ENDOMECHANICALS) ×3 IMPLANT
TROCAR XCEL NON-BLD 5MMX100MML (ENDOMECHANICALS) ×3 IMPLANT
TUBING INSUFFLATOR HI FLOW (MISCELLANEOUS) ×3 IMPLANT

## 2016-05-19 NOTE — Progress Notes (Signed)
[place] [facility] [place] NAME: Dorothy Rogers    MR#:  [id number]  DATE OF BIRTH:  November 24, 1950  SUBJECTIVE:  CHIEF COMPLAINT:  Patient's seen and examine after sx , still lethargic , family bed side   REVIEW OF SYSTEMS:  Ros  Unobtainable , pt is post op  DRUG ALLERGIES:   Allergies  Allergen Reactions  . Tramadol Swelling  . Beta Adrenergic Blockers Hives  . Celebrex [Celecoxib] Other (See Comments)    A lot of pressure in head  . Niacin Other (See Comments)    flushing  . Niacin And Related Other (See Comments)    Flushing and hot sensation  . Oxycodone Nausea Only  . Shellfish Allergy Nausea And Vomiting  . Vicodin [Hydrocodone-Acetaminophen] Other (See Comments)    Hypoglycemic episodes  . Heparin Rash    VITALS:  Blood pressure (!) 153/62, pulse 84, temperature 98 F (36.7 C), temperature source Oral, resp. rate 19, height 5\' 2"  (1.575 m), weight 78.5 kg (173 lb), SpO2 100 %.  PHYSICAL EXAMINATION:  GENERAL:  65 y.o.-year-old patient lying in the bed with no acute distress.  EYES: Pupils equal, round, reactive to light and accommodation. No scleral icterus.  HEENT: Head atraumatic, normocephalic. Oropharynx and nasopharynx clear.  NECK:  Supple, no jugular venous distention. No thyroid enlargement, no tenderness.  LUNGS: Normal breath sounds bilaterally, no wheezing, rales,rhonchi or crepitation. No use of accessory muscles of respiration.  CARDIOVASCULAR: S1, S2 normal. No murmurs, rubs, or gallops.  ABDOMEN: s/p laparoscopic chole 05/19/16 . old laparoscopic scars are healing well Bowel sounds present. No organomegaly or mass.  EXTREMITIES: No pedal edema, cyanosis, or clubbing.  NEUROLOGIC: pt is delerious from sedation Gait not checked.  PSYCHIATRIC: The patient is delerious from sedation SKIN: No obvious rash, lesion, or ulcer.    LABORATORY PANEL:   CBC  Recent Labs Lab 05/18/16 0419  WBC 5.7  HGB 9.7*   HCT 28.3*  PLT 194   ------------------------------------------------------------------------------------------------------------------  Chemistries   Recent Labs Lab 05/17/16 1224  05/18/16 1538  NA 134*  < > 138  K 4.5  < > 4.1  CL 100*  < > 110  CO2 23  < > 22  GLUCOSE 302*  < > 124*  BUN 25*  < > 18  CREATININE 1.77*  < > 1.54*  CALCIUM 9.1  < > 8.3*  MG 2.0  --   --   AST 21  --  16  ALT 26  --  20  ALKPHOS 79  --  63  BILITOT 1.2  --  1.1  < > = values in this interval not displayed. ------------------------------------------------------------------------------------------------------------------  Cardiac Enzymes No results for input(s): TROPONINI in the last 168 hours. ------------------------------------------------------------------------------------------------------------------  RADIOLOGY:  Nm Hepatobiliary Liver Func  Result Date: 05/18/2016 CLINICAL DATA:  Nausea for several weeks and history of gallstones EXAM: NUCLEAR MEDICINE HEPATOBILIARY IMAGING TECHNIQUE: Sequential images of the abdomen were obtained out to 60 minutes following intravenous administration of radiopharmaceutical. RADIOPHARMACEUTICALS:  5.43 mCi Tc-71m  Choletec IV COMPARISON:  07/07/2015 FINDINGS: Prompt uptake and biliary excretion of activity by the liver is seen. There is opacification of the biliary tree with subsequent small-bowel visualization. There is a paucity of activity identified in the gallbladder fossa when compared with the prior CT examination. Central activity is identified which in part corresponds to the biliary tree as well as the adjacent small bowel. Continued imaging to 2 hours was in no  significant gallbladder activity is noted. IMPRESSION: Nonvisualization of the gallbladder. In the appropriate clinical setting this could represent acute cholecystitis. Evaluation with ultrasound may be helpful to assess for wall thickening and pericholecystic fluid. Electronically Signed    By: Inez Catalina M.D.   On: 05/18/2016 14:37   US Abdomen Limited Ruq  Result Date: 05/18/2016 CLINICAL DATA:  Cholelithiasis. Right upper quadrant pain and nausea for 2 days. EXAM: US ABDOMEN LIMITED - RIGHT UPPER QUADRANT COMPARISON:  Radionuclide hepatobiliary scan of 05/18/2016 FINDINGS: Gallbladder: Multiple calculi are present within the gallbladder lumen, measuring up 1.6 cm. No gallbladder mural thickening or pericholecystic fluid. Gallbladder wall measures 2.6 mm. The patient was not tender to probe pressure over the gallbladder. Common bile duct: Diameter: Normal, 5 mm. Liver: Mildly increased echogenicity of hepatic parenchyma consistent with fatty infiltration. No focal liver lesion. IMPRESSION: Cholelithiasis without sonographic evidence of acute cholecystitis. Mild hepatic steatosis. Electronically Signed   By: Andreas Newport M.D.   On: 05/18/2016 19:12    EKG:   Orders placed or performed during the hospital encounter of 05/12/16  . ED EKG  . ED EKG  . EKG 12-Lead  . EKG 12-Lead    ASSESSMENT AND PLAN:   # Intractable nausea and vomiting- HIDA scan is done and u s with cholelithiasis s/p lap cholecystectomy POD # 0  Zofran and Phenergan when necessary, IV fluid support,  Supportive treatment  GI and surgery recommendations appreciated  OP gi f/u  Patient is a status post laparoscopic paraesophageal hernia repair 2 months ago, scars healing well Resume  Aspirin today  and Plavix tomorrow if ok with sx  # ARF on CKD stage III.  Provided IV fluids  Creatinine 1.77-1.55 - 1.54 TSH is normal  # Hyponatremia. Sodium at 139 improved with Normal saline IV.  #Hypertension. Continue hypertension medication.  #Diabetes. sliding scale   hold Lantus due to poor oral intake secondary to nausea and vomiting.      All the records are reviewed and case discussed with Care Management/Social Workerr. Management plans discussed with the patient, family and they are in  agreement.  CODE STATUS: fc   TOTAL TIME TAKING CARE OF THIS PATIENT: 36  minutes.   POSSIBLE D/C IN 2  DAYS, DEPENDING ON CLINICAL CONDITION.  Note: This dictation was prepared with Dragon dictation along with smaller phrase technology. Any transcriptional errors that result from this process are unintentional.   Nicholes Mango M.D on 05/19/2016 at 3:15 PM  Between 7am to 6pm - Pager - [phone] After 6pm go to [email or web address] - password [email or web address] [facility]  Office  [phone]  CC: Primary care physician; Idelle Crouch, MD

## 2016-05-19 NOTE — Anesthesia Preprocedure Evaluation (Signed)
Anesthesia Evaluation  Patient identified by MRN, date of birth, ID band Patient awake    Reviewed: Allergy & Precautions, H&P , NPO status , Patient's Chart, lab work & pertinent test results  History of Anesthesia Complications Negative for: history of anesthetic complications  Airway Mallampati: III  TM Distance: <3 FB Neck ROM: limited    Dental no notable dental hx. (+) Poor Dentition, Chipped   Pulmonary neg pulmonary ROS, neg shortness of breath,    Pulmonary exam normal breath sounds clear to auscultation       Cardiovascular Exercise Tolerance: Good hypertension, (-) angina+ CAD, + Cardiac Stents and + Peripheral Vascular Disease  (-) Past MI and (-) DOE Normal cardiovascular exam Rhythm:regular Rate:Normal     Neuro/Psych PSYCHIATRIC DISORDERS  Neuromuscular disease    GI/Hepatic Neg liver ROS, hiatal hernia, GERD  Controlled,  Endo/Other  diabetes, Type 2, Insulin Dependent  Renal/GU ESRFRenal disease     Musculoskeletal  (+) Arthritis ,   Abdominal   Peds  Hematology negative hematology ROS (+)   Anesthesia Other Findings Past Medical History: No date: Anemia No date: Anemia No date: Arthritis     Comment: lower back, hips No date: Blood transfusion without reported diagnosis FEB AND NOV 2009: CAD (coronary artery disease)     Comment: 6 STENTS No date: 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 No date: Cataract No date: Chronic kidney disease     Comment: Stage III No date: Coronary artery disease No date: DDD (degenerative disc disease), lumbar No date: Diabetes mellitus without complication (HCC)     Comment: TYPE 2 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 No date: Hyperlipidemia No date: Hypertension     Comment: CONTROLLED ON MEDS No date: Vertigo     Comment: occasional, no episodes in 2-3 months No date: Vertigo     Comment: OCCASIONALLY No date: Vitamin B 12  deficiency  Past Surgical History: 02/04/2016: 24 HOUR PH STUDY N/A     Comment: Procedure: 24 HOUR PH STUDY;  Surgeon: Lucilla Lame, MD;  Location: [facility] ENDOSCOPY;  Service:               Endoscopy;  Laterality: N/A; No date: ABDOMINAL HYSTERECTOMY No date: BACK SURGERY     Comment: Lumbar spinal fusion 05/30/2015 2009: CARDIAC CATHETERIZATION No date: CATARACT EXTRACTION Left 03/19/2015: CATARACT EXTRACTION W/PHACO Right     Comment: Procedure: CATARACT EXTRACTION PHACO AND               INTRAOCULAR LENS PLACEMENT (IOC);  Surgeon:               Leandrew Koyanagi, MD;  Location: [place];  Service: Ophthalmology;                Laterality: Right;  DIABETIC - insulin and oral              meds 03/2008: CORONARY STENT PLACEMENT No date: CYSTECTOMY Right     Comment: wrist 02/04/2016: ESOPHAGEAL MANOMETRY N/A     Comment: Procedure: ESOPHAGEAL MANOMETRY (EM);                Surgeon: Lucilla Lame, MD;  Location: [facility]               ENDOSCOPY;  Service: Endoscopy;  Laterality:  N/A; 09/04/2015: ESOPHAGOGASTRODUODENOSCOPY (EGD) WITH PROPOFOL N/A     Comment: Procedure: ESOPHAGOGASTRODUODENOSCOPY (EGD)               WITH PROPOFOL;  Surgeon: Lucilla Lame, MD;                Location: [place];  Service:               Endoscopy;  Laterality: N/A;  INSULIN DEPENDENT              DIABETIC No date: EYE SURGERY Left     Comment: cataract extraction 2001, 2014, 2015: LUMBAR FUSION No date: SPINE SURGERY     Comment: 2001, 2014, 2015 No date: TUMOR EXCISION     Comment: back of neck  BMI    Body Mass Index:  31.64 kg/m      Reproductive/Obstetrics negative OB ROS                             Anesthesia Physical Anesthesia Plan  ASA: III  Anesthesia Plan: General ETT   Post-op Pain Management:    Induction:   Airway Management Planned:   Additional Equipment:   Intra-op Plan:    Post-operative Plan:   Informed Consent: I have reviewed the patients History and Physical, chart, labs and discussed the procedure including the risks, benefits and alternatives for the proposed anesthesia with the patient or authorized representative who has indicated his/her understanding and acceptance.   Dental Advisory Given  Plan Discussed with: Anesthesiologist, CRNA and Surgeon  Anesthesia Plan Comments:         Anesthesia Quick Evaluation

## 2016-05-19 NOTE — Brief Op Note (Signed)
05/17/2016 - 05/19/2016  11:15 AM  PATIENT:  Dorothy Rogers  65 y.o. female  PRE-OPERATIVE DIAGNOSIS:  Persistent nausea and anorexia  POST-OPERATIVE DIAGNOSIS:  Persistent nausea and anorexia  PROCEDURE:  Procedure(s): LAPAROSCOPIC CHOLECYSTECTOMY (N/A)  SURGEON:  Surgeon(s) and Role:    * Clayburn Pert, MD - Primary  PHYSICIAN ASSISTANT:   ASSISTANTS: none   ANESTHESIA:   general  EBL:  Total I/O In: 700 [I.V.:700] Out: 10 [Blood:10]  BLOOD ADMINISTERED:none  DRAINS: none   LOCAL MEDICATIONS USED:  MARCAINE   , XYLOCAINE  and Amount: 29 ml  SPECIMEN:  Source of Specimen:  gallbladder  DISPOSITION OF SPECIMEN:  PATHOLOGY  COUNTS:  YES  TOURNIQUET:  * No tourniquets in log *  DICTATION: .Dragon Dictation  PLAN OF CARE: return to inpatient status  PATIENT DISPOSITION:  PACU - hemodynamically stable.   Delay start of Pharmacological VTE agent (>24hrs) due to surgical blood loss or risk of bleeding: no

## 2016-05-19 NOTE — Anesthesia Postprocedure Evaluation (Signed)
Anesthesia Post Note  Patient: Dorothy Rogers  Procedure(s) Performed: Procedure(s) (LRB): LAPAROSCOPIC CHOLECYSTECTOMY (N/A)  Patient location during evaluation: PACU Anesthesia Type: General Level of consciousness: awake and alert and oriented Pain management: pain level controlled Vital Signs Assessment: post-procedure vital signs reviewed and stable Respiratory status: spontaneous breathing, nonlabored ventilation and respiratory function stable Cardiovascular status: blood pressure returned to baseline and stable Postop Assessment: no signs of nausea or vomiting Anesthetic complications: no     Last Vitals:  Vitals:   05/19/16 1210 05/19/16 1249  BP:  (!) 153/62  Pulse:  84  Resp:    Temp: 36.2 C 36.7 C    Last Pain:  Vitals:   05/19/16 1249  TempSrc: Oral  PainSc:                  [name]

## 2016-05-19 NOTE — Anesthesia Procedure Notes (Signed)
Procedure Name: Intubation Date/Time: 05/19/2016 9:50 AM Performed by: Hedda Slade Pre-anesthesia Checklist: Patient identified, Emergency Drugs available, Suction available and Patient being monitored Patient Re-evaluated:Patient Re-evaluated prior to inductionOxygen Delivery Method: Circle system utilized Preoxygenation: Pre-oxygenation with 100% oxygen Intubation Type: IV induction Ventilation: Mask ventilation without difficulty Laryngoscope Size: Mac and 3 Grade View: Grade I Tube type: Oral Tube size: 7.0 mm Number of attempts: 1 Airway Equipment and Method: Stylet Placement Confirmation: ETT inserted through vocal cords under direct vision,  positive ETCO2 and breath sounds checked- equal and bilateral Secured at: 21 cm Tube secured with: Tape Dental Injury: Teeth and Oropharynx as per pre-operative assessment

## 2016-05-19 NOTE — Op Note (Signed)
Laparoscopic Cholecystectomy  Pre-operative Diagnosis: Cholecystitis  Post-operative Diagnosis: Same  Procedure: Laparoscopic cholecystectomy  Surgeon: Juanda Crumble T. Adonis Huguenin, MD FACS  Anesthesia: Gen. with endotracheal tube  Assistant: None  Procedure Details  The patient was seen again in the Holding Room. The benefits, complications, treatment options, and expected outcomes were discussed with the patient. The risks of bleeding, infection, recurrence of symptoms, failure to resolve symptoms, bile duct damage, bile duct leak, retained common bile duct stone, bowel injury, any of which could require further surgery and/or ERCP, stent, or papillotomy were reviewed with the patient. The likelihood of improving the patient's symptoms with return to their baseline status is good.  The patient and/or family concurred with the proposed plan, giving informed consent.  The patient was taken to Operating Room, identified as Dorothy Rogers and the procedure verified as Laparoscopic Cholecystectomy.  A Time Out was held and the above information confirmed.  Prior to the induction of general anesthesia, antibiotic prophylaxis was administered. VTE prophylaxis was in place. General endotracheal anesthesia was then administered and tolerated well. After the induction, the abdomen was prepped with Chloraprep and draped in the sterile fashion. The patient was positioned in the supine position.  Local anesthetic  was injected into the skin near the umbilicus and an incision made. The Veress needle was placed. Pneumoperitoneum was then created with CO2 and tolerated well without any adverse changes in the patient's vital signs. A 26mm port was placed in the periumbilical position and the abdominal cavity was explored. The trocar was noted to be within some fatty adhesions and was directed into the free peritoneal space. Two 5-mm ports were placed in the right upper quadrant and a 12 mm epigastric port was placed all  under direct vision. All skin incisions  were infiltrated with a local anesthetic agent before making the incision and placing the trocars. The umbilical trocar was looked back upon from the upper abdominal trochars and some of the adhesions were taken down. There is no evidence of damage to any of the deep returning structures from either the needle or the trocar.  The patient was positioned  in reverse Trendelenburg, tilted slightly to the patient's left.  The gallbladder was identified, the fundus grasped and retracted cephalad. Adhesions were lysed bluntly. The infundibulum was grasped and retracted laterally, exposing the peritoneum overlying the triangle of Calot. This was then divided and exposed in a blunt fashion. A critical view of the cystic duct and cystic artery was obtained.  The cystic duct was clearly identified and bluntly dissected. The duct and artery were serially clipped with endoclips and cut with EndoShears.  The gallbladder was taken from the gallbladder fossa in a retrograde fashion with the electrocautery. The gallbladder was removed and placed in an Endocatch bag. The liver bed was irrigated and inspected. Hemostasis was achieved with the electrocautery. Copious irrigation was utilized and was repeatedly aspirated until clear.  The gallbladder and Endocatch sac were then removed through the epigastric port site. The epigastric port site had to be stretched with a Kelly forceps to allow the gallbladder to be removed.  Inspection of the right upper quadrant was performed. No bleeding, bile duct injury or leak, or bowel injury was noted. Pneumoperitoneum was released. 4-0 subcuticular Monocryl was used to close the skin. Steristrips and Mastisol and sterile dressings were  applied.  The patient was then extubated and brought to the recovery room in stable condition. Sponge, lap, and needle counts were correct at closure and at  the conclusion of the case.   Findings: Acute  Cholecystitis   Estimated Blood Loss: 10 mL         Drains: None         Specimens: Gallbladder           Complications: none               [name] T. Adonis Huguenin, MD, FACS

## 2016-05-19 NOTE — Interval H&P Note (Signed)
History and Physical Interval Note:  05/19/2016 9:15 AM  Dorothy Rogers  has presented today for surgery, with the diagnosis of N/A  The various methods of treatment have been discussed with the patient and family. After consideration of risks, benefits and other options for treatment, the patient has consented to  Procedure(s): LAPAROSCOPIC CHOLECYSTECTOMY (N/A) as a surgical intervention .  The patient's history has been reviewed, patient examined, no change in status, stable for surgery.  I have reviewed the patient's chart and labs.  Questions were answered to the patient's satisfaction.     Clayburn Pert

## 2016-05-19 NOTE — Transfer of Care (Signed)
Immediate Anesthesia Transfer of Care Note  Patient: Dorothy Rogers  Procedure(s) Performed: Procedure(s): LAPAROSCOPIC CHOLECYSTECTOMY (N/A)  Patient Location: PACU  Anesthesia Type:General  Level of Consciousness: sedated  Airway & Oxygen Therapy: Patient Spontanous Breathing and Patient connected to face mask oxygen  Post-op Assessment: Report given to RN and Post -op Vital signs reviewed and stable  Post vital signs: Reviewed and stable  Last Vitals:  Vitals:   05/19/16 0900 05/19/16 1124  BP: 138/68 137/63  Pulse: 74 91  Resp: 18 16  Temp: 37 C 36.6 C    Last Pain:  Vitals:   05/19/16 0900  TempSrc: Tympanic  PainSc:          Complications: No apparent anesthesia complications

## 2016-05-19 NOTE — Care Management Obs Status (Signed)
[facility] NOTIFICATION   Patient Details  Name: HANNI ALOIA MRN: [id number] Date of Birth: [birth year]   Medicare Observation Status Notification Given:  Yes    Shelbie Ammons, RN 05/19/2016, 1:31 PM

## 2016-05-19 NOTE — Progress Notes (Signed)
Pt and family is very concerned with her getting a sandwich to eat.  They are stating that her nausea would get worse if she doesn't eat and the clear liquids aren't satisfying her enough.  Dr. Bridgett Larsson was paged and the response was to stay on clear liquids.  Pt's family would not accept this, so GI MD Vicente Males was paged and he reiterated the same.  Clear liquids only until NPO at midnight for possible surgery by Dr. Adonis Huguenin tomorrow.

## 2016-05-19 NOTE — H&P (View-Only) (Signed)
Patient ID: Dorothy Rogers, female   DOB: 04/25/1951, 66 y.o.   MRN: [id number]  CC: Persistent nausea and anorexia  HPI Dorothy Rogers is a 65 y.o. female well-known to the surgery service as she is 2 months status post a laparoscopic paraesophageal hernia repair performed by myself. Surgery consult requested by Dr. Bridgett Larsson due to persistent nausea and anorexia. Patient provides an inconsistent history as she is unable to truly identify what causes her nausea what makes it worse. She states that when she eats she is able force food down but she has persistent nausea. Her primary complaint of causes of nausea his activity. She's had numerous outpatient workups including CT scans and swallow studies which do not show any breakdown from the antireflux repair. She states that her rapid breathing and chest discomfort has resolved and that she's not been having reflux or indigestion since the surgery that she had before. She currently denies any fevers, chills, chest pain, shortness of breath, diarrhea. She has been having constipation, nausea but no vomiting.  HPI  Past Medical History:  Diagnosis Date  . Anemia   . Anemia   . Arthritis    lower back, hips  . Blood transfusion without reported diagnosis   . CAD (coronary artery disease) FEB AND NOV 2009   6 STENTS  . Cancer ([place])    tumor back of neck-fibroushistocytoma  . Cataract   . Chronic kidney disease    Stage III  . Coronary artery disease   . DDD (degenerative disc disease), lumbar   . Diabetes mellitus without complication ([place])    TYPE 2  . Herpes simplex   . Hyperlipidemia   . Hypertension    CONTROLLED ON MEDS  . Vertigo    occasional, no episodes in 2-3 months  . Vertigo    OCCASIONALLY  . Vitamin B 12 deficiency     Past Surgical History:  Procedure Laterality Date  . [place] STUDY N/A 02/04/2016   Procedure: [place] STUDY;  Surgeon: Lucilla Lame, MD;  Location: [facility] ENDOSCOPY;  Service: Endoscopy;  Laterality:  N/A;  . ABDOMINAL HYSTERECTOMY    . BACK SURGERY     Lumbar spinal fusion 05/30/2015  . CARDIAC CATHETERIZATION  2009  . CATARACT EXTRACTION Left   . CATARACT EXTRACTION W/PHACO Right 03/19/2015   Procedure: CATARACT EXTRACTION PHACO AND INTRAOCULAR LENS PLACEMENT ([place]);  Surgeon: Leandrew Koyanagi, MD;  Location: [place];  Service: Ophthalmology;  Laterality: Right;  DIABETIC - insulin and oral meds  . CORONARY STENT PLACEMENT  03/2008  . CYSTECTOMY Right    wrist  . ESOPHAGEAL MANOMETRY N/A 02/04/2016   Procedure: ESOPHAGEAL MANOMETRY (EM);  Surgeon: Lucilla Lame, MD;  Location: [facility] ENDOSCOPY;  Service: Endoscopy;  Laterality: N/A;  . ESOPHAGOGASTRODUODENOSCOPY (EGD) WITH PROPOFOL N/A 09/04/2015   Procedure: ESOPHAGOGASTRODUODENOSCOPY (EGD) WITH PROPOFOL;  Surgeon: Lucilla Lame, MD;  Location: [place];  Service: Endoscopy;  Laterality: N/A;  INSULIN DEPENDENT DIABETIC  . EYE SURGERY Left    cataract extraction  . LUMBAR FUSION  2001, 2014, 2015  . [place]     2001, 2014, 2015  . TUMOR EXCISION     back of neck    Family History  Problem Relation Age of Onset  . Heart disease Father   . Diabetes Other   . Breast cancer Neg Hx     Social History Social History  Substance Use Topics  . Smoking status: Never Smoker  . Smokeless  tobacco: Never Used  . Alcohol use 0.0 oz/week     Comment: 2-3 drinks per year    Allergies  Allergen Reactions  . Tramadol Swelling  . Beta Adrenergic Blockers Hives  . Celebrex [Celecoxib] Other (See Comments)    A lot of pressure in head  . Niacin Other (See Comments)    flushing  . Niacin And Related Other (See Comments)    Flushing and hot sensation  . Oxycodone Nausea Only  . Shellfish Allergy Nausea And Vomiting  . Vicodin [Hydrocodone-Acetaminophen] Other (See Comments)    Hypoglycemic episodes  . Heparin Rash    Current Facility-Administered Medications  Medication Dose Route Frequency Provider Last  Rate Last Dose  . 0.9 % NaCl with KCl 20 mEq/ L  infusion   Intravenous Continuous Demetrios Loll, MD 75 mL/hr at 05/18/16 0705    . atorvastatin (LIPITOR) tablet 40 mg  40 mg Oral QHS Demetrios Loll, MD   40 mg at 05/17/16 2159  . diltiazem (CARDIZEM CD) 24 hr capsule 120 mg  120 mg Oral Daily Demetrios Loll, MD   120 mg at 05/18/16 0910  . ferrous sulfate tablet 325 mg  325 mg Oral Daily Demetrios Loll, MD   325 mg at 05/18/16 0910  . insulin aspart (novoLOG) injection 0-5 Units  0-5 Units Subcutaneous QHS Demetrios Loll, MD      . insulin aspart (novoLOG) injection 0-9 Units  0-9 Units Subcutaneous TID WC Demetrios Loll, MD      . LORazepam (ATIVAN) tablet 1 mg  1 mg Oral Q6H PRN Demetrios Loll, MD      . losartan (COZAAR) tablet 100 mg  100 mg Oral q morning - 10a Demetrios Loll, MD   100 mg at 05/18/16 0910  . ondansetron (ZOFRAN) tablet 4 mg  4 mg Oral Q6H PRN Demetrios Loll, MD       Or  . ondansetron [facility]) injection 4 mg  4 mg Intravenous Q6H PRN Demetrios Loll, MD      . pantoprazole (PROTONIX) EC tablet 40 mg  40 mg Oral BID Demetrios Loll, MD   40 mg at 05/18/16 0910  . prochlorperazine (COMPAZINE) tablet 10 mg  10 mg Oral Q6H PRN Demetrios Loll, MD      . sertraline (ZOLOFT) tablet 50 mg  50 mg Oral QHS Demetrios Loll, MD         Review of Systems A Multi-point review of systems was asked and was negative except for the findings documented in the history of present illness  Physical Exam Blood pressure (!) 137/53, pulse 70, temperature 98.2 F (36.8 C), temperature source Oral, resp. rate 18, height 5\' 2"  (1.575 m), weight 78.5 kg (173 lb), SpO2 98 %. CONSTITUTIONAL: No acute distress. EYES: Pupils are equal, round, and reactive to light, Sclera are non-icteric. EARS, NOSE, MOUTH AND THROAT: The oropharynx is clear. The oral mucosa is pink and moist. Hearing is intact to voice. LYMPH NODES:  Lymph nodes in the neck are normal. RESPIRATORY:  Lungs are clear. There is normal respiratory effort, with equal breath sounds bilaterally, and  without pathologic use of accessory muscles. CARDIOVASCULAR: Heart is regular without murmurs, gallops, or rubs. GI: The abdomen is soft, nontender, and nondistended. There are no palpable masses. There is no hepatosplenomegaly. There are normal bowel sounds in all quadrants. GU: Rectal deferred.   MUSCULOSKELETAL: Normal muscle strength and tone. No cyanosis or edema.   SKIN: Turgor is good and there are no pathologic skin lesions  or ulcers. NEUROLOGIC: Motor and sensation is grossly normal. Cranial nerves are grossly intact. PSYCH:  Oriented to person, place and time. Affect is normal.  Data Reviewed Images and labs reviewed, labs show a normal white blood cell count 6.7, mildly elevated creatinine of 1.55 but other electrolytes per really within normal limits. Most recent CT scan and swallow study showed an intact fundoplication without evidence of stricture or slipping of the wrap. I have personally reviewed the patient's imaging, laboratory findings and medical records.    Assessment    Persistent nausea and anorexia    Plan    65 year old female that is 2 months status post laparoscopic paraesophageal hernia repair. Her persistent nausea and anorexia is difficult to diagnose and treat due to the patient. Discussed getting a GI evaluation while she is in the hospital. Patient also is known to have gallstones and therefore ruling out a biliary source for her nausea is not unreasonable. At current, there is no indication for any surgical intervention but a HIDA scan will be obtained to further work up her biliary system. Surgery will continue to follow with you.     Time spent with the patient was 80 minutes, with more than 50% of the time spent in face-to-face education, counseling and care coordination.     Clayburn Pert, MD FACS General Surgeon 05/18/2016, 9:41 AM

## 2016-05-19 NOTE — Care Management (Signed)
Admitted to [facility] with the diagnosis of nausea/vomitting under observation status. Lives with husband [name], [phone]). Last seen Dr. Doy Hutching about 1 1/2 weeks ago. No home health. No skilled facility. No home oxygen. Prescriptions are filled at [facility] in [place]. Decreased appetite. Lost 15 lbs in  2 months. No falls. Takes care of all basic activities of daily living herself, can drive. Husband can drive.  Shelbie Ammons RN MSN CCM Care Management

## 2016-05-20 DIAGNOSIS — K8010 Calculus of gallbladder with chronic cholecystitis without obstruction: Secondary | ICD-10-CM | POA: Diagnosis not present

## 2016-05-20 LAB — COMPREHENSIVE METABOLIC PANEL
ALBUMIN: 3.1 g/dL — AB (ref 3.5–5.0)
ALK PHOS: 63 U/L (ref 38–126)
ALT: 48 U/L (ref 14–54)
AST: 53 U/L — AB (ref 15–41)
Anion gap: 4 — ABNORMAL LOW (ref 5–15)
BILIRUBIN TOTAL: 0.9 mg/dL (ref 0.3–1.2)
BUN: 16 mg/dL (ref 6–20)
CO2: 22 mmol/L (ref 22–32)
Calcium: 8 mg/dL — ABNORMAL LOW (ref 8.9–10.3)
Chloride: 113 mmol/L — ABNORMAL HIGH (ref 101–111)
Creatinine, Ser: 1.46 mg/dL — ABNORMAL HIGH (ref 0.44–1.00)
GFR calc Af Amer: 42 mL/min — ABNORMAL LOW (ref >60)
GFR calc non Af Amer: 36 mL/min — ABNORMAL LOW (ref >60)
GLUCOSE: 199 mg/dL — AB (ref 65–99)
POTASSIUM: 4.8 mmol/L (ref 3.5–5.1)
Sodium: 139 mmol/L (ref 135–145)
TOTAL PROTEIN: 5.6 g/dL — AB (ref 6.5–8.1)

## 2016-05-20 LAB — CBC
HEMATOCRIT: 28.3 % — AB (ref 35.0–47.0)
Hemoglobin: 9.7 g/dL — ABNORMAL LOW (ref 12.0–16.0)
MCH: 31.4 pg (ref 26.0–34.0)
MCHC: 34.2 g/dL (ref 32.0–36.0)
MCV: 91.8 fL (ref 80.0–100.0)
PLATELETS: 170 10*3/uL (ref 150–440)
RBC: 3.08 MIL/uL — AB (ref 3.80–5.20)
RDW: 14.3 % (ref 11.5–14.5)
WBC: 6.6 10*3/uL (ref 3.6–11.0)

## 2016-05-20 LAB — GLUCOSE, CAPILLARY
Glucose-Capillary: 220 mg/dL — ABNORMAL HIGH (ref 65–99)
Glucose-Capillary: 197 mg/dL — ABNORMAL HIGH (ref 65–99)
Glucose-Capillary: 249 mg/dL — ABNORMAL HIGH (ref 65–99)

## 2016-05-20 LAB — SURGICAL PATHOLOGY

## 2016-05-20 MED ORDER — GLUCERNA SHAKE PO LIQD
237.0000 mL | Freq: Two times a day (BID) | ORAL | Status: DC
  Administered 2016-05-20: 15:00:00 237 mL via ORAL

## 2016-05-20 MED ORDER — ASPIRIN 81 MG PO TBEC: 81.0000 mg | DELAYED_RELEASE_TABLET | Freq: Every day | ORAL | Status: DC

## 2016-05-20 MED ORDER — PREMIER PROTEIN SHAKE: 11.0000 [oz_av] | ORAL | 0 refills | Status: DC

## 2016-05-20 MED ORDER — PREMIER PROTEIN SHAKE
11.0000 [oz_av] | ORAL | Status: DC
  Administered 2016-05-20: 11:00:00 11 [oz_av] via ORAL

## 2016-05-20 MED ORDER — GLUCERNA SHAKE PO LIQD: 237.0000 mL | Freq: Two times a day (BID) | ORAL | 0 refills | Status: DC

## 2016-05-20 MED ORDER — ACETAMINOPHEN-CODEINE #3 300-30 MG PO TABS: 1.0000 | ORAL_TABLET | Freq: Four times a day (QID) | ORAL | 0 refills | Status: DC | PRN

## 2016-05-20 NOTE — Progress Notes (Signed)
1 Day Post-Op   Subjective:  Patient reports that she did well immediately postop from her laparoscopic cholecystectomy. She has been tolerating a regular diet without nausea or vomiting last night. She reports difficulty in eating breakfast this morning but is unable to fully describe why she cannot eat. She describes that her "stomach just wanted except it". But also states she is hungry. No nausea or vomiting at this time. Pain is well controlled.  Vital signs in last 24 hours: Temp:  [97.2 F (36.2 C)-98.5 F (36.9 C)] 98.5 F (36.9 C) (12/20 2112) Pulse Rate:  [77-93] 77 (12/21 0443) Resp:  [14-19] 19 (12/20 1158) BP: (103-153)/(49-86) 117/49 (12/21 0443) SpO2:  [98 %-100 %] 99 % (12/21 0443) Last BM Date: 05/19/16  Intake/Output from previous day: 12/20 0701 - 12/21 0700 In: 2203.8 [I.V.:2203.8] Out: 10 [Blood:10]  GI: Abdomen soft, purple tender to palpation at incision sites, nondistended. Dressings are intact with a minimal amount of serosanguineous drainage. No evidence of infection.  Lab Results:  CBC  Recent Labs  05/18/16 0419 05/20/16 0413  WBC 5.7 6.6  HGB 9.7* 9.7*  HCT 28.3* 28.3*  PLT 194 170   CMP     Component Value Date/Time   NA 139 05/20/2016 0413   NA 141 05/14/2014 1842   K 4.8 05/20/2016 0413   K 4.3 05/14/2014 1842   CL 113 (H) 05/20/2016 0413   CL 109 (H) 05/14/2014 1842   CO2 22 05/20/2016 0413   CO2 23 05/14/2014 1842   GLUCOSE 199 (H) 05/20/2016 0413   GLUCOSE 154 (H) 05/14/2014 1842   BUN 16 05/20/2016 0413   BUN 20 (H) 05/14/2014 1842   CREATININE 1.46 (H) 05/20/2016 0413   CREATININE 1.51 (H) 05/14/2014 1842   CALCIUM 8.0 (L) 05/20/2016 0413   CALCIUM 8.4 (L) 05/14/2014 1842   PROT 5.6 (L) 05/20/2016 0413   PROT 6.4 05/14/2014 1842   ALBUMIN 3.1 (L) 05/20/2016 0413   ALBUMIN 2.6 (L) 05/14/2014 1842   AST 53 (H) 05/20/2016 0413   AST 23 05/14/2014 1842   ALT 48 05/20/2016 0413   ALT 19 05/14/2014 1842   ALKPHOS 63  05/20/2016 0413   ALKPHOS 82 05/14/2014 1842   BILITOT 0.9 05/20/2016 0413   BILITOT 0.8 05/14/2014 1842   GFRNONAA 36 (L) 05/20/2016 0413   GFRNONAA 37 (L) 05/14/2014 1842   GFRNONAA 34 (L) 10/18/2011 1944   GFRAA 42 (L) 05/20/2016 0413   GFRAA 45 (L) 05/14/2014 1842   GFRAA 40 (L) 10/18/2011 1944   PT/INR No results for input(s): LABPROT, INR in the last 72 hours.  Studies/Results: Nm Hepatobiliary Liver Func  Result Date: 05/18/2016 CLINICAL DATA:  Nausea for several weeks and history of gallstones EXAM: NUCLEAR MEDICINE HEPATOBILIARY IMAGING TECHNIQUE: Sequential images of the abdomen were obtained out to 60 minutes following intravenous administration of radiopharmaceutical. RADIOPHARMACEUTICALS:  5.43 mCi Tc-57m  Choletec IV COMPARISON:  07/07/2015 FINDINGS: Prompt uptake and biliary excretion of activity by the liver is seen. There is opacification of the biliary tree with subsequent small-bowel visualization. There is a paucity of activity identified in the gallbladder fossa when compared with the prior CT examination. Central activity is identified which in part corresponds to the biliary tree as well as the adjacent small bowel. Continued imaging to 2 hours was in no significant gallbladder activity is noted. IMPRESSION: Nonvisualization of the gallbladder. In the appropriate clinical setting this could represent acute cholecystitis. Evaluation with ultrasound may be helpful to assess for  wall thickening and pericholecystic fluid. Electronically Signed   By: Inez Catalina M.D.   On: 05/18/2016 14:37   US Abdomen Limited Ruq  Result Date: 05/18/2016 CLINICAL DATA:  Cholelithiasis. Right upper quadrant pain and nausea for 2 days. EXAM: US ABDOMEN LIMITED - RIGHT UPPER QUADRANT COMPARISON:  Radionuclide hepatobiliary scan of 05/18/2016 FINDINGS: Gallbladder: Multiple calculi are present within the gallbladder lumen, measuring up 1.6 cm. No gallbladder mural thickening or pericholecystic  fluid. Gallbladder wall measures 2.6 mm. The patient was not tender to probe pressure over the gallbladder. Common bile duct: Diameter: Normal, 5 mm. Liver: Mildly increased echogenicity of hepatic parenchyma consistent with fatty infiltration. No focal liver lesion. IMPRESSION: Cholelithiasis without sonographic evidence of acute cholecystitis. Mild hepatic steatosis. Electronically Signed   By: Andreas Newport M.D.   On: 05/18/2016 19:12    Assessment/Plan: 65 year old female now postop day 1 from a laparoscopic cholecystectomy. Confusing course with a difficult patient. Appears to be tolerating a diet but at the same time states that she is not tolerating a diet. Had long conversation with her and her husband that's a long as she can maintain hydration orally that she does not need to be in the hospital. They voice understanding and the patient will try to drink water to maintain hydration. If she cannot maintain her hydration orally then she needs to continue IV fluids. If her nausea persists then the likely cause at this point is diabetic gastroparesis and she would need further workup by GI. However, I anticipate she'll be discharged later today as long as she can maintain hydration. She'll need surgery follow-up in approximately 2 weeks for a wound check.   Clayburn Pert, MD FACS General Surgeon  05/20/2016

## 2016-05-20 NOTE — Progress Notes (Signed)
Discussed discharge instructions and medications with pt and her husband. IV removed. All questions addressed.  Information about a cholecystectomy given as well as after care information.  Pt transported home via car by her husband.  Clarise Cruz, RN

## 2016-05-20 NOTE — Discharge Summary (Signed)
Dorothy Rogers    MR#:  [id number]  DATE OF BIRTH:  1950/06/08  DATE OF ADMISSION:  05/17/2016 ADMITTING PHYSICIAN: Demetrios Loll, MD  DATE OF DISCHARGE: 05/20/16 PRIMARY CARE PHYSICIAN: SPARKS,JEFFREY D, MD    ADMISSION DIAGNOSIS:  Nausea [R11.0]  DISCHARGE DIAGNOSIS:   Acute cholecystitis status post cholecystectomy laparoscopic Severe protein calorie malnutrition SECONDARY DIAGNOSIS:   Past Medical History:  Diagnosis Date  . Anemia   . Anemia   . Arthritis    lower back, hips  . Blood transfusion without reported diagnosis   . CAD (coronary artery disease) FEB AND NOV 2009   6 STENTS  . Cancer ([place])    tumor back of neck-fibroushistocytoma  . Cataract   . Chronic kidney disease    Stage III  . Coronary artery disease   . DDD (degenerative disc disease), lumbar   . Diabetes mellitus without complication ([place])    TYPE 2  . Herpes simplex   . Hyperlipidemia   . Hypertension    CONTROLLED ON MEDS  . Vertigo    occasional, no episodes in 2-3 months  . Vertigo    OCCASIONALLY  . Vitamin B 12 deficiency     HOSPITAL COURSE:  Dorothy Rogers  is a 65 y.o. female with a known history of Hypertension, CK D stage III, CAD, diabetes and anemia. She is S/p lap Paraesophageal H w nissen fundoplication [id number]. She developed nausea and vomiting after surgery. She came to the ED multiple times, was given Zofran and Phenergan and sent home. But she still has intractable nausea and vomiting. She denies any abdominal pain, diarrhea, melena or bloody stool. ED physician suggested admitting patient and get surgical and GI consult. Please review history and physical for details   Hospital course  Intractable nausea and vomiting- HIDA scan is done and u s with cholelithiasis s/p lap cholecystectomy POD # 1 Tolerated advanced diet well today  Zofran and Phenergan when necessary, IV fluid support provided  during the hospital course Supportive treatment  Outpatient follow-up with gastroenterology and surgery  Continue wound care as recommended by surgery - 2 weeks Patient is a status post laparoscopic paraesophageal hernia repair 2 months ago, scars healing well Resume  Aspirin   and Plavix Pain management as needed  # ARF on CKDstage III.  Provided IV fluids  Creatinine 1.77-1.55 - 1.54-1.46 TSH is normal  # Hyponatremia. Sodium at 139 improved with Normal saline IV.  #Hypertension. Continue hypertension medication.  #Diabetes. sliding scale during hospital course. Resume Lantus at discharge    Patient is clinically stable and discharged home   DISCHARGE CONDITIONS:   stable  CONSULTS OBTAINED:  Treatment Team:  Clayburn Pert, MD Jonathon Bellows, MD   PROCEDURES lap cholecystectomy   DRUG ALLERGIES:   Allergies  Allergen Reactions  . Tramadol Swelling  . Beta Adrenergic Blockers Hives  . Celebrex [Celecoxib] Other (See Comments)    A lot of pressure in head  . Niacin Other (See Comments)    flushing  . Niacin And Related Other (See Comments)    Flushing and hot sensation  . Oxycodone Nausea Only  . Shellfish Allergy Nausea And Vomiting  . Vicodin [Hydrocodone-Acetaminophen] Other (See Comments)    Hypoglycemic episodes  . Heparin Rash    DISCHARGE MEDICATIONS:   Current Discharge Medication List    START taking these medications   Details  acetaminophen-codeine (TYLENOL #3) 300-30 MG tablet Take  1-2 tablets by mouth every 6 (six) hours as needed for moderate pain or severe pain. Qty: 30 tablet, Refills: 0    aspirin EC 81 MG EC tablet Take 1 tablet (81 mg total) by mouth daily.    feeding supplement, GLUCERNA SHAKE, (GLUCERNA SHAKE) LIQD Take 237 mLs by mouth 2 (two) times daily between meals. Qty: 60 Can, Refills: 0    protein supplement shake (PREMIER PROTEIN) LIQD Take 325 mLs (11 oz total) by mouth daily. Qty: 30 Can, Refills: 0       CONTINUE these medications which have NOT CHANGED   Details  atorvastatin (LIPITOR) 40 MG tablet Take 40 mg by mouth at bedtime.     calcium-vitamin D (OSCAL WITH D) 500-200 MG-UNIT tablet Take 1 tablet by mouth daily with breakfast.    clopidogrel (PLAVIX) 75 MG tablet Take 75 mg by mouth daily.    diltiazem (CARDIZEM CD) 120 MG 24 hr capsule Take 1 capsule by mouth daily.    ferrous sulfate 325 (65 FE) MG tablet Take 325 mg by mouth daily.    glipiZIDE (GLUCOTROL) 10 MG tablet Take 10 mg by mouth 2 (two) times daily.    insulin lispro (HUMALOG) 100 UNIT/ML injection Inject 4-10 Units into the skin 3 (three) times daily with meals. Based on sliding scale. Mid range.    losartan (COZAAR) 100 MG tablet Take 100 mg by mouth every morning. AM    ondansetron (ZOFRAN ODT) 8 MG disintegrating tablet Take 8 mg by mouth as needed for nausea or vomiting.    pantoprazole (PROTONIX) 20 MG tablet Take 20 mg by mouth daily.    prochlorperazine (COMPAZINE) 10 MG tablet Take 1 tablet (10 mg total) by mouth every 6 (six) hours as needed for nausea or vomiting. Qty: 30 tablet, Refills: 0    vitamin B-12 (CYANOCOBALAMIN) 1000 MCG tablet Take 1,000 mcg by mouth daily.    acyclovir (ZOVIRAX) 400 MG tablet Take 400 mg by mouth as needed.     insulin glargine (LANTUS) 100 UNIT/ML injection Inject 10 Units into the skin daily.    LORazepam (ATIVAN) 1 MG tablet Take 1 tablet (1 mg total) by mouth every 6 (six) hours as needed for anxiety. Qty: 10 tablet, Refills: 0    promethazine (PHENERGAN) 12.5 MG tablet Take 1 tablet (12.5 mg total) by mouth every 6 (six) hours as needed for nausea or vomiting. Qty: 10 tablet, Refills: 0    sertraline (ZOLOFT) 50 MG tablet Take 50 mg by mouth at bedtime.         DISCHARGE INSTRUCTIONS:   Follow-up with surgery Dr. Adonis Huguenin in 2 weeks Follow-up with the gastroenterology Dr. Vicente Males in 2 weeks Follow-up with primary care physician Dr. Doy Hutching in a  week   DIET:  Cardiac diet, diabetic  DISCHARGE CONDITION:  Stable  ACTIVITY:  Activity as tolerated  OXYGEN:  Home Oxygen: No.   Oxygen Delivery: room air  DISCHARGE LOCATION:  home   If you experience worsening of your admission symptoms, develop shortness of breath, life threatening emergency, suicidal or homicidal thoughts you must seek medical attention immediately by calling 911 or calling your MD immediately  if symptoms less severe.  You Must read complete instructions/literature along with all the possible adverse reactions/side effects for all the Medicines you take and that have been prescribed to you. Take any new Medicines after you have completely understood and accpet all the possible adverse reactions/side effects.   Please note  You were cared for  by a hospitalist during your hospital stay. If you have any questions about your discharge medications or the care you received while you were in the hospital after you are discharged, you can call the unit and asked to speak with the hospitalist on call if the hospitalist that took care of you is not available. Once you are discharged, your primary care physician will handle any further medical issues. Please note that NO REFILLS for any discharge medications will be authorized once you are discharged, as it is imperative that you return to your primary care physician (or establish a relationship with a primary care physician if you do not have one) for your aftercare needs so that they can reassess your need for medications and monitor your lab values.     Today  Chief Complaint  Patient presents with  . Nausea   Patient is tolerating advanced diet. Denies any abdominal pain. Answering all questions appropriately. Wants to go home.  ROS:  CONSTITUTIONAL: Denies fevers, chills. Denies any fatigue, weakness.  EYES: Denies blurry vision, double vision, eye pain. EARS, NOSE, THROAT: Denies tinnitus, ear pain, hearing  loss. RESPIRATORY: Denies cough, wheeze, shortness of breath.  CARDIOVASCULAR: Denies chest pain, palpitations, edema.  GASTROINTESTINAL: Denies nausea, vomiting, diarrhea, abdominal pain. Denies bright red blood per rectum. GENITOURINARY: Denies dysuria, hematuria. ENDOCRINE: Denies nocturia or thyroid problems. HEMATOLOGIC AND LYMPHATIC: Denies easy bruising or bleeding. SKIN: Denies rash or lesion. MUSCULOSKELETAL: Denies pain in neck, back, shoulder, knees, hips or arthritic symptoms.  NEUROLOGIC: Denies paralysis, paresthesias.  PSYCHIATRIC: Denies anxiety or depressive symptoms.   VITAL SIGNS:  Blood pressure (!) 123/54, pulse 88, temperature 98.7 F (37.1 C), temperature source Oral, resp. rate 18, height 5\' 2"  (1.575 m), weight 78.5 kg (173 lb), SpO2 100 %.  I/O:    Intake/Output Summary (Last 24 hours) at 05/20/16 1556 Last data filed at 05/20/16 1016  Gross per 24 hour  Intake          1896.25 ml  Output                0 ml  Net          1896.25 ml    PHYSICAL EXAMINATION:  GENERAL:  65 y.o.-year-old patient lying in the bed with no acute distress.  EYES: Pupils equal, round, reactive to light and accommodation. No scleral icterus. Extraocular muscles intact.  HEENT: Head atraumatic, normocephalic. Oropharynx and nasopharynx clear.  NECK:  Supple, no jugular venous distention. No thyroid enlargement, no tenderness.  LUNGS: Normal breath sounds bilaterally, no wheezing, rales,rhonchi or crepitation. No use of accessory muscles of respiration.  CARDIOVASCULAR: S1, S2 normal. No murmurs, rubs, or gallops.  ABDOMEN: Soft, non-tender, non-distended. Laparoscopic scars-with the dressing Bowel sounds present. No organomegaly or mass.  EXTREMITIES: No pedal edema, cyanosis, or clubbing.  NEUROLOGIC: Cranial nerves II through XII are intact. Muscle strength 5/5 in all extremities. Sensation intact. Gait not checked.  PSYCHIATRIC: The patient is alert and oriented x 3.  SKIN:  No obvious rash, lesion, or ulcer.   DATA REVIEW:   CBC  Recent Labs Lab 05/20/16 0413  WBC 6.6  HGB 9.7*  HCT 28.3*  PLT 170    Chemistries   Recent Labs Lab 05/17/16 1224  05/20/16 0413  NA 134*  < > 139  K 4.5  < > 4.8  CL 100*  < > 113*  CO2 23  < > 22  GLUCOSE 302*  < > 199*  BUN 25*  < >  16  CREATININE 1.77*  < > 1.46*  CALCIUM 9.1  < > 8.0*  MG 2.0  --   --   AST 21  < > 53*  ALT 26  < > 48  ALKPHOS 79  < > 63  BILITOT 1.2  < > 0.9  < > = values in this interval not displayed.  Cardiac Enzymes No results for input(s): TROPONINI in the last 168 hours.  Microbiology Results  Results for orders placed or performed during the hospital encounter of 07/10/15  C difficile quick scan w PCR reflex     Status: None   Collection Time: 07/10/15  5:21 AM  Result Value Ref Range Status   C Diff antigen NEGATIVE NEGATIVE Final   C Diff toxin NEGATIVE NEGATIVE Final   C Diff interpretation Negative for C. difficile  Final  Gastrointestinal Panel by PCR , Stool     Status: None   Collection Time: 07/10/15  7:21 AM  Result Value Ref Range Status   Campylobacter species NOT DETECTED NOT DETECTED Final   Plesimonas shigelloides NOT DETECTED NOT DETECTED Final   Salmonella species NOT DETECTED NOT DETECTED Final   Yersinia enterocolitica NOT DETECTED NOT DETECTED Final   Vibrio species NOT DETECTED NOT DETECTED Final   Vibrio cholerae NOT DETECTED NOT DETECTED Final   Enteroaggregative E coli (EAEC) NOT DETECTED NOT DETECTED Final   Enteropathogenic E coli (EPEC) NOT DETECTED NOT DETECTED Final   Enterotoxigenic E coli (ETEC) NOT DETECTED NOT DETECTED Final   Shiga like toxin producing E coli (STEC) NOT DETECTED NOT DETECTED Final   E. coli O157 NOT DETECTED NOT DETECTED Final   Shigella/Enteroinvasive E coli (EIEC) NOT DETECTED NOT DETECTED Final   Cryptosporidium NOT DETECTED NOT DETECTED Final   Cyclospora cayetanensis NOT DETECTED NOT DETECTED Final   Entamoeba  histolytica NOT DETECTED NOT DETECTED Final   Giardia lamblia NOT DETECTED NOT DETECTED Final   Adenovirus F40/41 NOT DETECTED NOT DETECTED Final   Astrovirus NOT DETECTED NOT DETECTED Final   Norovirus GI/GII NOT DETECTED NOT DETECTED Final   Rotavirus A NOT DETECTED NOT DETECTED Final   Sapovirus (I, II, IV, and V) NOT DETECTED NOT DETECTED Final    RADIOLOGY:  Nm Hepatobiliary Liver Func  Result Date: 05/18/2016 CLINICAL DATA:  Nausea for several weeks and history of gallstones EXAM: NUCLEAR MEDICINE HEPATOBILIARY IMAGING TECHNIQUE: Sequential images of the abdomen were obtained out to 60 minutes following intravenous administration of radiopharmaceutical. RADIOPHARMACEUTICALS:  5.43 mCi Tc-63m  Choletec IV COMPARISON:  07/07/2015 FINDINGS: Prompt uptake and biliary excretion of activity by the liver is seen. There is opacification of the biliary tree with subsequent small-bowel visualization. There is a paucity of activity identified in the gallbladder fossa when compared with the prior CT examination. Central activity is identified which in part corresponds to the biliary tree as well as the adjacent small bowel. Continued imaging to 2 hours was in no significant gallbladder activity is noted. IMPRESSION: Nonvisualization of the gallbladder. In the appropriate clinical setting this could represent acute cholecystitis. Evaluation with ultrasound may be helpful to assess for wall thickening and pericholecystic fluid. Electronically Signed   By: Inez Catalina M.D.   On: 05/18/2016 14:37   US Abdomen Limited Ruq  Result Date: 05/18/2016 CLINICAL DATA:  Cholelithiasis. Right upper quadrant pain and nausea for 2 days. EXAM: US ABDOMEN LIMITED - RIGHT UPPER QUADRANT COMPARISON:  Radionuclide hepatobiliary scan of 05/18/2016 FINDINGS: Gallbladder: Multiple calculi are present within the gallbladder  lumen, measuring up 1.6 cm. No gallbladder mural thickening or pericholecystic fluid. Gallbladder wall  measures 2.6 mm. The patient was not tender to probe pressure over the gallbladder. Common bile duct: Diameter: Normal, 5 mm. Liver: Mildly increased echogenicity of hepatic parenchyma consistent with fatty infiltration. No focal liver lesion. IMPRESSION: Cholelithiasis without sonographic evidence of acute cholecystitis. Mild hepatic steatosis. Electronically Signed   By: Andreas Newport M.D.   On: 05/18/2016 19:12    EKG:   Orders placed or performed during the hospital encounter of 05/12/16  . ED EKG  . ED EKG  . EKG 12-Lead  . EKG 12-Lead      Management plans discussed with the patient, family and they are in agreement.  CODE STATUS:     Code Status Orders        Start     Ordered   05/17/16 2004  Full code  Continuous     05/17/16 2003    Code Status History    Date Active Date Inactive Code Status Order ID Comments User Context   04/08/2016  1:08 AM 04/09/2016  5:37 PM Full Code [id number]  Lance Coon, MD Inpatient   03/11/2016  2:09 PM 03/14/2016  4:10 PM Full Code [id number]  Clayburn Pert, MD Inpatient   07/10/2015 10:47 AM 07/11/2015  6:33 PM Full Code [id number]  Bettey Costa, MD Inpatient   06/09/2015 10:56 PM 06/10/2015 10:12 PM Full Code [id number]  Lance Coon, MD Inpatient    Advance Directive Documentation   Flowsheet Row Most Recent Value  Type of Advance Directive  Healthcare Power of Attorney, Living will  Pre-existing out of facility DNR order (yellow form or pink MOST form)  No data  "MOST" Form in Place?  No data      TOTAL TIME TAKING CARE OF THIS PATIENT: 45 minutes.   Note: This dictation was prepared with Dragon dictation along with smaller phrase technology. Any transcriptional errors that result from this process are unintentional.   @[facility] @  on 05/20/2016 at 3:56 PM  Between 7am to 6pm - Pager - [phone]  After 6pm go to [email or web address] - password [email or web address] [facility]  Office  [phone]  CC: Primary care physician;  Idelle Crouch, MD

## 2016-05-20 NOTE — Progress Notes (Signed)
Inpatient Diabetes Program Recommendations  AACE/ADA: New Consensus Statement on Inpatient Glycemic Control (2015)  Target Ranges:  Prepandial:   less than 140 mg/dL      Peak postprandial:   less than 180 mg/dL (1-2 hours)      Critically ill patients:  140 - 180 mg/dL   Lab Results  Component Value Date   GLUCAP 197 (H) 05/20/2016   HGBA1C 6.5 (H) 06/09/2015      Inpatient Diabetes Program Recommendations:  Please change diet to heart healthy/carb modified.  Gentry Fitz, RN, BA, MHA, CDE Diabetes Coordinator Inpatient Diabetes Program  [phone] (Team Pager) [phone] ([employer]) 05/20/2016 1:52 PM

## 2016-05-20 NOTE — Discharge Instructions (Signed)
Follow-up with surgery Dr. Adonis Huguenin in 2 weeks Follow-up with the gastroenterology Dr. Vicente Males in 2 weeks Follow-up with primary care physician Dr. Doy Hutching in a week

## 2016-05-21 ENCOUNTER — Telehealth: Payer: Self-pay

## 2016-05-21 DIAGNOSIS — R110 Nausea: Secondary | ICD-10-CM

## 2016-05-21 NOTE — Telephone Encounter (Signed)
Patient had a laparoscopic cholecystectomy on 05/19/16 with Dr. Adonis Huguenin. Dr. Adonis Huguenin sent them home after the surgery but told them to call us if there were any complications. [name] (husband) is calling stating that she is having the same symptoms. When I asked him what those symptoms are, patient just said she can't eat. He wants to know whether he should take her to the hospital again or not. Please call patient and advice.

## 2016-05-21 NOTE — Telephone Encounter (Signed)
Spoke with patient's husband. He states that patient was not able to eat dinner last night or breakfast this morning due to a feeling of "her stomach turning" when she has food in front of her. This is only occurring intermittently. Denies vomiting but she is spitting food out of her mouth because she feels that food is disgusting when she is trying to eat this. Her last bowel movement was on Wednesday morning and was a small bowel movement. Denies abdominal pain, diarrhea, or fever/chills at this time.  Spoke with Dr. Adonis Huguenin at this time. He would like to place patient on constipation regimen per protocol, order a gastric emptying study, and have patient also follow-up with Dr. Vicente Males after this testing has been completed.  Gastric emptying study ordered. This has been scheduled for 05/28/16 at [facility]. Arrive at 1115am. Stop all stomach medications and remain NPO 8 hours prior to exam. Patient was given all information and verbalizes understanding of this.  Also, Informed patient that they should increase water intake and activity level as much as possible to help with bowel movement. Also educated that they may use Miralax over the counter x 2 doses, 6 hours apart, followed by Dulcolax x 2 doses, 6 hours apart until they have a successful bowel movement. If they are unsuccessful with oral medications, they will need to do 2 fleets enemas back to back. Asked patient to call back for further instructions if after taking all doses of these medications, they are still unsuccessful with a bowel movement.  Informed patient that she can take Compazine or Reglan 15 minutes prior to eating to see if this will help with nausea.

## 2016-05-24 ENCOUNTER — Encounter: Payer: Self-pay | Admitting: Emergency Medicine

## 2016-05-24 ENCOUNTER — Emergency Department
Admission: EM | Admit: 2016-05-24 | Discharge: 2016-05-24 | Disposition: A | Discharge status: Home / Self Care | Payer: Medicare Other | Attending: Emergency Medicine | Admitting: Emergency Medicine

## 2016-05-24 DIAGNOSIS — Z955 Presence of coronary angioplasty implant and graft: Secondary | ICD-10-CM | POA: Diagnosis not present

## 2016-05-24 DIAGNOSIS — R110 Nausea: Secondary | ICD-10-CM

## 2016-05-24 DIAGNOSIS — Z859 Personal history of malignant neoplasm, unspecified: Secondary | ICD-10-CM | POA: Diagnosis not present

## 2016-05-24 DIAGNOSIS — I129 Hypertensive chronic kidney disease with stage 1 through stage 4 chronic kidney disease, or unspecified chronic kidney disease: Secondary | ICD-10-CM | POA: Insufficient documentation

## 2016-05-24 DIAGNOSIS — Z794 Long term (current) use of insulin: Secondary | ICD-10-CM | POA: Insufficient documentation

## 2016-05-24 DIAGNOSIS — E1122 Type 2 diabetes mellitus with diabetic chronic kidney disease: Secondary | ICD-10-CM | POA: Diagnosis not present

## 2016-05-24 DIAGNOSIS — N183 Chronic kidney disease, stage 3 (moderate): Secondary | ICD-10-CM | POA: Diagnosis not present

## 2016-05-24 DIAGNOSIS — Z7982 Long term (current) use of aspirin: Secondary | ICD-10-CM | POA: Diagnosis not present

## 2016-05-24 DIAGNOSIS — I2510 Atherosclerotic heart disease of native coronary artery without angina pectoris: Secondary | ICD-10-CM | POA: Insufficient documentation

## 2016-05-24 DIAGNOSIS — Z79899 Other long term (current) drug therapy: Secondary | ICD-10-CM | POA: Diagnosis not present

## 2016-05-24 LAB — CBC
HCT: 32.4 % — ABNORMAL LOW (ref 35.0–47.0)
Hemoglobin: 11.2 g/dL — ABNORMAL LOW (ref 12.0–16.0)
MCH: 31.4 pg (ref 26.0–34.0)
MCHC: 34.5 g/dL (ref 32.0–36.0)
MCV: 90.9 fL (ref 80.0–100.0)
PLATELETS: 231 10*3/uL (ref 150–440)
RBC: 3.57 MIL/uL — ABNORMAL LOW (ref 3.80–5.20)
RDW: 14.4 % (ref 11.5–14.5)
WBC: 6.4 10*3/uL (ref 3.6–11.0)

## 2016-05-24 LAB — COMPREHENSIVE METABOLIC PANEL
ALT: 30 U/L (ref 14–54)
AST: 23 U/L (ref 15–41)
Albumin: 4.2 g/dL (ref 3.5–5.0)
Alkaline Phosphatase: 77 U/L (ref 38–126)
Anion gap: 9 (ref 5–15)
BILIRUBIN TOTAL: 0.4 mg/dL (ref 0.3–1.2)
BUN: 26 mg/dL — AB (ref 6–20)
CALCIUM: 9.7 mg/dL (ref 8.9–10.3)
CO2: 22 mmol/L (ref 22–32)
CREATININE: 1.46 mg/dL — AB (ref 0.44–1.00)
Chloride: 107 mmol/L (ref 101–111)
GFR calc Af Amer: 42 mL/min — ABNORMAL LOW (ref >60)
GFR, EST NON AFRICAN AMERICAN: 36 mL/min — AB (ref >60)
Glucose, Bld: 146 mg/dL — ABNORMAL HIGH (ref 65–99)
Potassium: 4.1 mmol/L (ref 3.5–5.1)
Sodium: 138 mmol/L (ref 135–145)
TOTAL PROTEIN: 7.5 g/dL (ref 6.5–8.1)

## 2016-05-24 LAB — LIPASE, BLOOD: Lipase: 30 U/L (ref 11–51)

## 2016-05-24 MED ORDER — PROMETHAZINE HCL 25 MG/ML IJ SOLN
INTRAMUSCULAR | Status: AC
  Administered 2016-05-24: 12.5 mg via INTRAVENOUS
  Filled 2016-05-24: qty 1

## 2016-05-24 MED ORDER — PROMETHAZINE HCL 25 MG/ML IJ SOLN
12.5000 mg | Freq: Once | INTRAMUSCULAR | Status: AC
  Administered 2016-05-24: 12.5 mg via INTRAVENOUS

## 2016-05-24 MED ORDER — SODIUM CHLORIDE 0.9 % IV BOLUS (SEPSIS)
1000.0000 mL | Freq: Once | INTRAVENOUS | Status: AC
  Administered 2016-05-24: 1000 mL via INTRAVENOUS

## 2016-05-24 NOTE — ED Notes (Signed)
Patient denies pain and is resting comfortably.  

## 2016-05-24 NOTE — Discharge Instructions (Signed)
As we discussed, your workup today was reassuring.  Though we do not know exactly what is causing your symptoms, it appears that you have no emergent medical condition at this time and that you are safe to go home and follow up as recommended in this paperwork. ° °Please return immediately to the Emergency Department if you develop any new or worsening symptoms that concern you. ° °

## 2016-05-24 NOTE — ED Triage Notes (Signed)
Pt presents to ED with c/o nausea all day today. Pt had gallbladder removed 12/20. Pt has taken Reglan, Zofran, and compazine throughout the day without relief. Pt denies vomiting, diarrhea, chest pain, or abd pain. Pt alert and oriented x 4, respirations even and unlabored.

## 2016-05-24 NOTE — ED Provider Notes (Signed)
[facility] Emergency Department Provider Note  ____________________________________________   First MD Initiated Contact with Patient 05/24/16 2258     (approximate)  I have reviewed the triage vital signs and the nursing notes.   HISTORY  Chief Complaint Nausea    HPI Dorothy Rogers is a 65 y.o. female who presents for evaluation of severe nausea.  He says at least her sixth visit to the emergency department for nausea and the last few weeks, and she has had a total of 11 ED visits in the last 6 months with 3 admissions.  She had her gallbladder removed less than a week ago because it was thought that perhaps the cholelithiasis was causing her persistent and refractory nausea.  She states that after she went home she had a day or 2 where she was asymptomatic and that her nausea came back yesterday.  She has Zofran, Phenergan, Compazine, and Ativan at home, all of which have variously worked in the past, but nothing worked for her today so her husband brought her for evaluation in the emergency department.  She describes the nausea as gradual in onset, nothing makes it better and nothing makes it worse.  She denies fever/chills, chest pain, shortness of breath, abdominal pain (except for some general soreness after her surgery), dysuria, constipation, diarrhea.  She took an aggressive bowel regimen after her surgery which has "kept me cleaned out".  She has been able to tolerate liquids and even some food but he has the persistent nausea that she experiences and makes it difficult for her to get to sleep.  She has follow-up with GI in 5 days as an outpatient.  He has had extensive imaging performed in the past including a HIDA scan prior to her surgery and previously Dr. Adonis Huguenin has recommended no additional imaging be performed in the emergency department in the absence of any new or different symptoms.   Past Medical History:  Diagnosis Date  . Anemia   .  Anemia   . Arthritis    lower back, hips  . Blood transfusion without reported diagnosis   . CAD (coronary artery disease) FEB AND NOV 2009   6 STENTS  . Cancer ([place])    tumor back of neck-fibroushistocytoma  . Cataract   . Chronic kidney disease    Stage III  . Coronary artery disease   . DDD (degenerative disc disease), lumbar   . Diabetes mellitus without complication ([place])    TYPE 2  . Herpes simplex   . Hyperlipidemia   . Hypertension    CONTROLLED ON MEDS  . Vertigo    occasional, no episodes in 2-3 months  . Vertigo    OCCASIONALLY  . Vitamin B 12 deficiency     Patient Active Problem List   Diagnosis Date Noted  . Cholelithiasis   . Protein-calorie malnutrition, severe 05/18/2016  . Supraventricular tachycardia ([place]) 05/05/2016  . SOB (shortness of breath)   . Near syncope 04/07/2016  . 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 Non-intractable cyclical vomiting with nausea   . Congenital esophageal defect   . Hiatal hernia   . Gastritis   . Diarrhea 07/10/2015  . Diabetes mellitus ([place]) 07/02/2015  . Adjustment disorder 06/16/2015  . Abdominal pain 06/16/2015  . C. difficile colitis 06/16/2015  . Intractable nausea and vomiting 06/09/2015  . Dehydration 06/09/2015  . Type 2 diabetes mellitus ([place]) 06/09/2015  . HLD (hyperlipidemia) 06/09/2015  . HTN (hypertension) 06/09/2015  . CAD (coronary artery  disease) 06/09/2015  . CKD (chronic kidney disease), stage III 06/09/2015  . Thrombocytopenia ([place]) 05/30/2015  . Lumbar canal stenosis 05/30/2015  . Disc disease with myelopathy, lumbar 05/30/2015  . Presence of stent in coronary artery 05/30/2015  . 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 Status post lumbar spinal fusion 12/25/2014  . DDD (degenerative disc disease), lumbar 11/18/2014  . Lumbar post-laminectomy syndrome 11/18/2014  . Facet syndrome, lumbar 11/18/2014  . Piriformis syndrome of left side 11/18/2014  . Status post lumbar spine operation  05/09/2014  . Adiposity 04/23/2014  . Thoracic and lumbosacral neuritis 03/27/2013  . H/O arthrodesis 03/27/2013  . Peripheral vascular disease ([place]) 03/22/2013  . Hypomagnesemia 03/22/2013  . Absolute anemia 03/22/2013    Past Surgical History:  Procedure Laterality Date  . [place] STUDY N/A 02/04/2016   Procedure: [place] STUDY;  Surgeon: Lucilla Lame, MD;  Location: [facility] ENDOSCOPY;  Service: Endoscopy;  Laterality: N/A;  . ABDOMINAL HYSTERECTOMY    . BACK SURGERY     Lumbar spinal fusion 05/30/2015  . CARDIAC CATHETERIZATION  2009  . CATARACT EXTRACTION Left   . CATARACT EXTRACTION W/PHACO Right 03/19/2015   Procedure: CATARACT EXTRACTION PHACO AND INTRAOCULAR LENS PLACEMENT ([place]);  Surgeon: Leandrew Koyanagi, MD;  Location: [place];  Service: Ophthalmology;  Laterality: Right;  DIABETIC - insulin and oral meds  . CHOLECYSTECTOMY N/A 05/19/2016   Procedure: LAPAROSCOPIC CHOLECYSTECTOMY;  Surgeon: Clayburn Pert, MD;  Location: [facility] ORS;  Service: General;  Laterality: N/A;  . CORONARY STENT PLACEMENT  03/2008  . CYSTECTOMY Right    wrist  . ESOPHAGEAL MANOMETRY N/A 02/04/2016   Procedure: ESOPHAGEAL MANOMETRY (EM);  Surgeon: Lucilla Lame, MD;  Location: [facility] ENDOSCOPY;  Service: Endoscopy;  Laterality: N/A;  . ESOPHAGOGASTRODUODENOSCOPY (EGD) WITH PROPOFOL N/A 09/04/2015   Procedure: ESOPHAGOGASTRODUODENOSCOPY (EGD) WITH PROPOFOL;  Surgeon: Lucilla Lame, MD;  Location: [place];  Service: Endoscopy;  Laterality: N/A;  INSULIN DEPENDENT DIABETIC  . EYE SURGERY Left    cataract extraction  . LUMBAR FUSION  2001, 2014, 2015  . [place]     2001, 2014, 2015  . TUMOR EXCISION     back of neck    Prior to Admission medications   Medication Sig Start Date End Date Taking? Authorizing Provider  acetaminophen-codeine (TYLENOL #3) 300-30 MG tablet Take 1-2 tablets by mouth every 6 (six) hours as needed for moderate pain or severe pain. 05/20/16  Yes Nicholes Mango, MD  acyclovir (ZOVIRAX) 400 MG tablet Take 400 mg by mouth as needed.  08/07/15  Yes Historical Provider, MD  aspirin EC 81 MG EC tablet Take 1 tablet (81 mg total) by mouth daily. 05/21/16  Yes Nicholes Mango, MD  atorvastatin (LIPITOR) 40 MG tablet Take 40 mg by mouth at bedtime.  08/16/14  Yes Historical Provider, MD  calcium-vitamin D (OSCAL WITH D) 500-200 MG-UNIT tablet Take 1 tablet by mouth daily with breakfast.   Yes Historical Provider, MD  clopidogrel (PLAVIX) 75 MG tablet Take 75 mg by mouth daily.   Yes Historical Provider, MD  diltiazem (CARDIZEM CD) 120 MG 24 hr capsule Take 1 capsule by mouth daily. 05/04/16 05/04/17 Yes Historical Provider, MD  ferrous sulfate 325 (65 FE) MG tablet Take 325 mg by mouth daily.   Yes Historical Provider, MD  glipiZIDE (GLUCOTROL) 10 MG tablet Take 10 mg by mouth 2 (two) times daily.   Yes Historical Provider, MD  insulin glargine (LANTUS) 100 UNIT/ML injection Inject 10 Units into  the skin daily.   Yes Historical Provider, MD  insulin lispro (HUMALOG) 100 UNIT/ML injection Inject 4-10 Units into the skin 3 (three) times daily with meals. Based on sliding scale. Mid range.   Yes Historical Provider, MD  LORazepam (ATIVAN) 1 MG tablet Take 1 tablet (1 mg total) by mouth every 6 (six) hours as needed for anxiety. 05/09/16 05/09/17 Yes Anne-Caroline Mariea Clonts, MD  losartan (COZAAR) 100 MG tablet Take 100 mg by mouth every morning. AM 02/05/14  Yes Historical Provider, MD  pantoprazole (PROTONIX) 20 MG tablet Take 20 mg by mouth daily.   Yes Historical Provider, MD  prochlorperazine (COMPAZINE) 10 MG tablet Take 1 tablet (10 mg total) by mouth every 6 (six) hours as needed for nausea or vomiting. 05/14/16  Yes Diego F Pabon, MD  promethazine (PHENERGAN) 12.5 MG tablet Take 1 tablet (12.5 mg total) by mouth every 6 (six) hours as needed for nausea or vomiting. 05/12/16  Yes Schuyler Amor, MD  sertraline (ZOLOFT) 50 MG tablet Take 50 mg by mouth at bedtime.  05/10/16  Yes Historical Provider, MD  vitamin B-12 (CYANOCOBALAMIN) 1000 MCG tablet Take 1,000 mcg by mouth daily.   Yes Historical Provider, MD  feeding supplement, GLUCERNA SHAKE, (GLUCERNA SHAKE) LIQD Take 237 mLs by mouth 2 (two) times daily between meals. 05/21/16   Nicholes Mango, MD  ondansetron (ZOFRAN ODT) 8 MG disintegrating tablet Take 8 mg by mouth as needed for nausea or vomiting.    Historical Provider, MD  protein supplement shake (PREMIER PROTEIN) LIQD Take 325 mLs (11 oz total) by mouth daily. 05/21/16   Nicholes Mango, MD    Allergies Tramadol; Beta adrenergic blockers; Celebrex [celecoxib]; Niacin; Niacin and related; Oxycodone; Shellfish allergy; Vicodin [hydrocodone-acetaminophen]; and Heparin  Family History  Problem Relation Age of Onset  . Heart disease Father   . Diabetes Other   . Breast cancer Neg Hx     Social History Social History  Substance Use Topics  . Smoking status: Never Smoker  . Smokeless tobacco: Never Used  . Alcohol use 0.0 oz/week     Comment: 2-3 drinks per year    Review of Systems Constitutional: No fever/chills Eyes: No visual changes. ENT: No sore throat. Cardiovascular: Denies chest pain. Respiratory: Denies shortness of breath. Gastrointestinal: No abdominal pain.  Severe nausea, no vomiting.  No diarrhea.  No constipation. Genitourinary: Negative for dysuria. Musculoskeletal: Negative for back pain. Skin: Negative for rash. Neurological: Negative for headaches, focal weakness or numbness.  10-point ROS otherwise negative.  ____________________________________________   PHYSICAL EXAM:  VITAL SIGNS: ED Triage Vitals  Enc Vitals Group     BP 05/24/16 2109 (!) 151/76     Pulse Rate 05/24/16 2109 100     Resp 05/24/16 2109 18     Temp 05/24/16 2109 98.1 F (36.7 C)     Temp Source 05/24/16 2109 Oral     SpO2 05/24/16 2109 98 %     Weight 05/24/16 2109 170 lb (77.1 kg)     Height 05/24/16 2109 5\' 2"  (1.575 m)     Head  Circumference --      Peak Flow --      Pain Score 05/24/16 2110 0     Pain Loc --      Pain Edu? --      Excl. in [place]? --     Constitutional: Alert and oriented. Well appearing and in no acute distress. Eyes: Conjunctivae are normal. PERRL. EOMI. Head: Atraumatic. Nose: No  congestion/rhinnorhea. Mouth/Throat: Mucous membranes are moist.  Oropharynx non-erythematous. Neck: No stridor.  No meningeal signs.   Cardiovascular: Normal rate, regular rhythm. Good peripheral circulation. Grossly normal heart sounds. Respiratory: Normal respiratory effort.  No retractions. Lungs CTAB. Gastrointestinal: Soft and With only mild tenderness to palpation around the surgical wounds. No distention. Well-appearing surgical wounds Musculoskeletal: No lower extremity tenderness nor edema. No gross deformities of extremities. Neurologic:  Normal speech and language. No gross focal neurologic deficits are appreciated.  Skin:  Skin is warm, dry and intact. No rash noted. Psychiatric: Mood and affect are normal. Speech and behavior are normal.  ____________________________________________   LABS (all labs ordered are listed, but only abnormal results are displayed)  Labs Reviewed  COMPREHENSIVE METABOLIC PANEL - Abnormal; Notable for the following:       Result Value   Glucose, Bld 146 (*)    BUN 26 (*)    Creatinine, Ser 1.46 (*)    GFR calc non Af Amer 36 (*)    GFR calc Af Amer 42 (*)    All other components within normal limits  CBC - Abnormal; Notable for the following:    RBC 3.57 (*)    Hemoglobin 11.2 (*)    HCT 32.4 (*)    All other components within normal limits  LIPASE, BLOOD  URINALYSIS, COMPLETE (UACMP) WITH MICROSCOPIC   ____________________________________________  EKG  None - EKG not ordered by ED physician ____________________________________________  RADIOLOGY   No results found.  ____________________________________________   PROCEDURES  Procedure(s)  performed:   Procedures   Critical Care performed: No ____________________________________________   INITIAL IMPRESSION / ASSESSMENT AND PLAN / ED COURSE  Pertinent labs & imaging results that were available during my care of the patient were reviewed by me and considered in my medical decision making (see chart for details).  The patient is in no acute distress with normal vital signs and labs which are completely unchanged from prior.  She states that she feels better after getting some Phenergan in the emergency department.  She and her husband both feel that they are ready to go home.  I explained to them that in the absence of any new or different symptoms, we seem to have reached the end of our abilities to diagnose her chronic nausea in the emergency department.  I encouraged her to try using her medicine at home and recommended that she not use both Compazine and Phenergan at the same time.  I gave her warning signs and symptoms that should prompt her to come back to the emergency department but otherwise encouraged her to follow up with her GI doctor on Friday.  She and her husband understand and agree with the plan.    ____________________________________________  FINAL CLINICAL IMPRESSION(S) / ED DIAGNOSES  Final diagnoses:  Chronic nausea     MEDICATIONS GIVEN DURING THIS VISIT:  Medications  promethazine (PHENERGAN) injection 12.5 mg (12.5 mg Intravenous Given 05/24/16 2137)  sodium chloride 0.9 % bolus 1,000 mL (1,000 mLs Intravenous New Bag/Given 05/24/16 2138)     NEW OUTPATIENT MEDICATIONS STARTED DURING THIS VISIT:  New Prescriptions   No medications on file    Modified Medications   No medications on file    Discontinued Medications   No medications on file     Note:  This document was prepared using Dragon voice recognition software and may include unintentional dictation errors.    Hinda Kehr, MD 05/24/16 [phone]

## 2016-05-28 ENCOUNTER — Telehealth: Payer: Self-pay

## 2016-05-28 ENCOUNTER — Ambulatory Visit
Admission: RE | Admit: 2016-05-28 | Discharge: 2016-05-28 | Disposition: A | Discharge status: Home / Self Care | Payer: Medicare Other | Source: Ambulatory Visit | Attending: General Surgery | Admitting: General Surgery

## 2016-05-28 ENCOUNTER — Ambulatory Visit: Payer: Medicare Other

## 2016-05-28 ENCOUNTER — Other Ambulatory Visit: Payer: Medicare Other

## 2016-05-28 DIAGNOSIS — R110 Nausea: Secondary | ICD-10-CM | POA: Diagnosis present

## 2016-05-28 MED ORDER — TECHNETIUM TC 99M SULFUR COLLOID
2.0000 | Freq: Once | INTRAVENOUS | Status: AC | PRN
  Administered 2016-05-28: 2.3 via INTRAVENOUS

## 2016-05-28 NOTE — Telephone Encounter (Addendum)
Spoke with Dr. Adonis Huguenin in regards to results. He would like patient to follow-up with Dr. Vicente Males on 06/02/16 as scheduled and no further testing needed at this time.   Spoke with patient and spouse at this time and explained this. They both verbalize understanding of this.

## 2016-05-28 NOTE — Telephone Encounter (Signed)
Radiology results : Gastric emptying study  Markedly abnormal gastric emptying study for solid material. 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   Dr. Allegra Lai with [facility]  [phone]

## 2016-05-28 NOTE — Telephone Encounter (Signed)
[name] (husband) called stating that Dorothy Rogers had surgey on the 20th and that we were supposed to call them to make an appointment and he hasn't heard anything from [place] yet. I went ahead and made his wife an appointment to see Dr. Adonis Huguenin on the 8th. [name] would like for [employer] to call him. He has some questions for her. Please call patient and advice

## 2016-05-28 NOTE — Telephone Encounter (Signed)
See other note from today

## 2016-06-02 ENCOUNTER — Ambulatory Visit (INDEPENDENT_AMBULATORY_CARE_PROVIDER_SITE_OTHER): Payer: Medicare Other | Admitting: Gastroenterology

## 2016-06-02 ENCOUNTER — Other Ambulatory Visit: Payer: Self-pay | Admitting: *Deleted

## 2016-06-02 ENCOUNTER — Encounter: Payer: Self-pay | Admitting: Gastroenterology

## 2016-06-02 VITALS — BP 126/80 | HR 85 | Temp 98.4°F | Resp 18 | Wt 172.4 lb

## 2016-06-02 DIAGNOSIS — K3184 Gastroparesis: Secondary | ICD-10-CM

## 2016-06-02 DIAGNOSIS — K5900 Constipation, unspecified: Secondary | ICD-10-CM

## 2016-06-02 NOTE — Progress Notes (Signed)
Primary Care Physician: Idelle Crouch, MD  Primary Gastroenterologist:  Dr. Jonathon Bellows   Chief Complaint  Patient presents with  . Gastric Emptying Study    Results    HPI: Dorothy Rogers is a 66 y.o. female here to follow up to her hospital discharge. I was consulted to see her when she was recently admitted on 05/17/16 for nausea and vomiting . Barium swallow with tablet 05/13/16 shows mild narrowing of GE jn with passage of tablet , possible mild wall thickening .  She has had prior ER visits for same issues and discharges when it was attributed to anxiety.  She has had a lap paraesophageal hernia repair with fundoplication in [id number] .  EGD in 08/2015 by Dr Allen Norris for nausea/vomiting showed medium sized hiatal hernia, antral gastritis that was negative for H pylori on biopsy.  At the time of her initial visit she was on Reglan ,had a positive HIDA scan , underwent a cholecystectomy. She did have elevated blood sugars ,some constpation.  Gall bladder specimen showed chronic cholecystitis and cholelithiasis.    Interval history 05/14/16-06/2015  Continued to have nausea after her cholecystectomy, commenced on bowel regime .  She has a bowel movement after dulcolax, she has been taking miralax, one bowel movement every 1-2 days- semi solid. Feels a bit better after a bowel movement . Dulcolax seemed to work better. Sugars around 170 mg   Gastric emptying study showed only 27% of her stomach was emptied at 4 hour mark which is very abnormal.  She says she eats every 2 hours and if she does not gets very sick . She takes Reglan BID as needed PRN. Last 4 days taken it scheduled.    Current Outpatient Prescriptions  Medication Sig Dispense Refill  . acetaminophen-codeine (TYLENOL #3) 300-30 MG tablet Take 1-2 tablets by mouth every 6 (six) hours as needed for moderate pain or severe pain. 30 tablet 0  . acyclovir (ZOVIRAX) 400 MG tablet Take 400 mg by mouth as needed.     Marland Kitchen  aspirin EC 81 MG EC tablet Take 1 tablet (81 mg total) by mouth daily.    Marland Kitchen atorvastatin (LIPITOR) 40 MG tablet Take 40 mg by mouth at bedtime.     . calcium-vitamin D (OSCAL WITH D) 500-200 MG-UNIT tablet Take 1 tablet by mouth daily with breakfast.    . clopidogrel (PLAVIX) 75 MG tablet Take 75 mg by mouth daily.    Marland Kitchen diltiazem (CARDIZEM CD) 120 MG 24 hr capsule Take 1 capsule by mouth daily.    . feeding supplement, GLUCERNA SHAKE, (GLUCERNA SHAKE) LIQD Take 237 mLs by mouth 2 (two) times daily between meals. 60 Can 0  . ferrous sulfate 325 (65 FE) MG tablet Take 325 mg by mouth daily.    Marland Kitchen glipiZIDE (GLUCOTROL) 10 MG tablet Take 10 mg by mouth 2 (two) times daily.    . insulin glargine (LANTUS) 100 UNIT/ML injection Inject 10 Units into the skin daily.    . insulin lispro (HUMALOG) 100 UNIT/ML injection Inject 4-10 Units into the skin 3 (three) times daily with meals. Based on sliding scale. Mid range.    Marland Kitchen LORazepam (ATIVAN) 1 MG tablet Take 1 tablet (1 mg total) by mouth every 6 (six) hours as needed for anxiety. 10 tablet 0  . losartan (COZAAR) 100 MG tablet Take 100 mg by mouth every morning. AM    . ondansetron (ZOFRAN ODT) 8 MG disintegrating tablet Take 8 mg by mouth as  needed for nausea or vomiting.    . pantoprazole (PROTONIX) 20 MG tablet Take 20 mg by mouth daily.    . prochlorperazine (COMPAZINE) 10 MG tablet Take 1 tablet (10 mg total) by mouth every 6 (six) hours as needed for nausea or vomiting. 30 tablet 0  . promethazine (PHENERGAN) 12.5 MG tablet Take 1 tablet (12.5 mg total) by mouth every 6 (six) hours as needed for nausea or vomiting. 10 tablet 0  . protein supplement shake (PREMIER PROTEIN) LIQD Take 325 mLs (11 oz total) by mouth daily. 30 Can 0  . sertraline (ZOLOFT) 50 MG tablet Take 50 mg by mouth at bedtime.    . vitamin B-12 (CYANOCOBALAMIN) 1000 MCG tablet Take 1,000 mcg by mouth daily.     No current facility-administered medications for this visit.      Allergies as of 06/02/2016 - Review Complete 06/02/2016  Allergen Reaction Noted  . Tramadol Swelling 03/11/2016  . Beta adrenergic blockers Hives 11/18/2014  . Celebrex [celecoxib] Other (See Comments) 11/18/2014  . Niacin Other (See Comments) 08/26/2015  . Niacin and related Other (See Comments) 11/18/2014  . Oxycodone Nausea Only 08/26/2015  . Shellfish allergy Nausea And Vomiting 11/18/2014  . Vicodin [hydrocodone-acetaminophen] Other (See Comments) 11/18/2014  . Heparin Rash 12/01/2015    ROS:  General: Negative for anorexia, weight loss, fever, chills, fatigue, weakness. ENT: Negative for hoarseness, difficulty swallowing , nasal congestion. CV: Negative for chest pain, angina, palpitations, dyspnea on exertion, peripheral edema.  Respiratory: Negative for dyspnea at rest, dyspnea on exertion, cough, sputum, wheezing.  GI: See history of present illness. GU:  Negative for dysuria, hematuria, urinary incontinence, urinary frequency, nocturnal urination.  Endo: Negative for unusual weight change.    Physical Examination:   BP 126/80   Pulse 85   Temp 98.4 F (36.9 C)   Resp 18   Wt 172 lb 6.4 oz (78.2 kg)   SpO2 100%   BMI 31.53 kg/m   General: Well-nourished, well-developed in no acute distress.  Eyes: No icterus. Conjunctivae pink. Mouth: Oropharyngeal mucosa moist and pink , no lesions erythema or exudate. Lungs: Clear to auscultation bilaterally. Non-labored. Heart: Regular rate and rhythm, no murmurs rubs or gallops.  Abdomen: Bowel sounds are normal, nontender, nondistended, no hepatosplenomegaly or masses, no abdominal bruits or hernia , no rebound or guarding.   Extremities: No lower extremity edema. No clubbing or deformities. Neuro: Alert and oriented x 3.  Grossly intact. Skin: Warm and dry, no jaundice.   Psych: Alert and cooperative, normal mood and affect.   Imaging Studies: Ct Abdomen Pelvis Wo Contrast  Result Date: 05/05/2016 CLINICAL DATA:   Left lower quadrant pain and constipation EXAM: CT ABDOMEN AND PELVIS WITHOUT CONTRAST TECHNIQUE: Multidetector CT imaging of the abdomen and pelvis was performed following the standard protocol without IV contrast. COMPARISON:  06/15/2015 FINDINGS: Lower chest: 6 mm right middle lobe nodule is again identified and stable. Hepatobiliary: Multiple gallstones are noted. The gallbladder and liver are otherwise within normal limits. Pancreas: Unremarkable. No pancreatic ductal dilatation or surrounding inflammatory changes. Spleen: Multiple splenic granulomas are seen. Adrenals/Urinary Tract: Adrenal glands are within normal limits. Kidneys are well visualized without renal calculi or obstructive changes. Bladder is decompressed. Stomach/Bowel: The appendix is within normal limits. The colon is well visualize without inflammatory changes. Inflammatory changes are noted surrounding the gastroesophageal junction. The previously seen hiatal hernia has reduced although some suggestion of mild twisting of the proximal stomach is noted. Vascular/Lymphatic: Aortic atherosclerosis. No enlarged  abdominal or pelvic lymph nodes. Reproductive: Status post hysterectomy. No adnexal masses. Other: No abdominal wall hernia or abnormality. No abdominopelvic ascites. Musculoskeletal: Postsurgical changes are noted in the lower lumbar spine. Bony donor sites are noted within the iliac wings bilaterally. IMPRESSION: Inflammatory changes surrounding the gastroesophageal junction and proximal stomach. The previously seen large hiatal hernia has reduced in the interval although some twisting of the proximal stomach is noted which may be related to the reduction of the hiatal hernia. The need for fluoroscopic evaluation or direct visualization can be determined on a clinical basis. Cholelithiasis without complicating factors. Stable right middle lobe nodule. No other focal abnormality is seen. Electronically Signed   By: Inez Catalina M.D.    On: 05/05/2016 16:11   Nm Hepatobiliary Liver Func  Result Date: 05/18/2016 CLINICAL DATA:  Nausea for several weeks and history of gallstones EXAM: NUCLEAR MEDICINE HEPATOBILIARY IMAGING TECHNIQUE: Sequential images of the abdomen were obtained out to 60 minutes following intravenous administration of radiopharmaceutical. RADIOPHARMACEUTICALS:  5.43 mCi Tc-69m  Choletec IV COMPARISON:  07/07/2015 FINDINGS: Prompt uptake and biliary excretion of activity by the liver is seen. There is opacification of the biliary tree with subsequent small-bowel visualization. There is a paucity of activity identified in the gallbladder fossa when compared with the prior CT examination. Central activity is identified which in part corresponds to the biliary tree as well as the adjacent small bowel. Continued imaging to 2 hours was in no significant gallbladder activity is noted. IMPRESSION: Nonvisualization of the gallbladder. In the appropriate clinical setting this could represent acute cholecystitis. Evaluation with ultrasound may be helpful to assess for wall thickening and pericholecystic fluid. Electronically Signed   By: Inez Catalina M.D.   On: 05/18/2016 14:37   Nm Gastric Emptying  Result Date: 05/28/2016 CLINICAL DATA:  Chronic nausea. Patient status post Nissen fundoplication. Diabetes mellitus EXAM: NUCLEAR MEDICINE GASTRIC EMPTYING SCAN TECHNIQUE: After oral ingestion of radiolabeled meal, sequential abdominal images were obtained for 4 hours. Percentage of activity emptying the stomach was calculated at 1 hour, 2 hour, 3 hour, and 4 hours. RADIOPHARMACEUTICALS:  2.3 mCi Tc-49m sulfur colloid in standardized meal including egg COMPARISON:  None. FINDINGS: Expected location of the stomach in the left upper quadrant. Ingested meal empties the stomach gradually over the course of the study. 0% emptied at 1 hr ( normal >= 10%) 2% emptied at 2 hr ( normal >= 40%) 12% emptied at 3 hr ( normal >= 70%) 27% emptied at  4 hr ( normal >= 90%) IMPRESSION: Markedly abnormal gastric emptying study for solid material. 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 These results will be called to the ordering clinician or representative by the Radiologist Assistant, and communication documented in the PACS or zVision Dashboard. Electronically Signed   By: Lowella Grip III M.D.   On: 05/28/2016 13:32   Dg Esophagus  Result Date: 05/13/2016 CLINICAL DATA:  History of paraesophageal hernia repair. Abnormal thickening of the distal esophagus seen on prior CT chest dated 04/07/2016. EXAM: ESOPHOGRAM / BARIUM SWALLOW / BARIUM TABLET STUDY TECHNIQUE: Combined double contrast and single contrast examination performed using effervescent crystals, thick barium liquid, and thin barium liquid. The patient was observed with fluoroscopy swallowing a 13 mm barium sulphate tablet. FLUOROSCOPY TIME:  Fluoroscopy Time:  0.8 minute Radiation Exposure Index (if provided by the fluoroscopic device): 5.6 mGy Number of Acquired Spot Images: 0 COMPARISON:  CT chest 12/01/2015, 04/07/2016 FINDINGS: There was  normal pharyngeal anatomy and motility. Contrast flowed freely through the esophagus without evidence of a mass. Mild relative narrowing of the esophagogastric junction which does not restrict the passage of a barium tablet. There was normal esophageal mucosa without evidence of irregularity or ulceration. Esophageal motility was normal. No evidence of reflux. No definite hiatal hernia was demonstrated. At the end of the examination a 13 mm barium tablet was administered which transited through the esophagus and esophagogastric junction without delay. IMPRESSION: Mild relative narrowing of the esophagogastric junction which does not restrict the passage of a barium tablet. The relative narrowing may reflect mild wall thickening secondary to esophagitis or recent surgery as was seen on prior CT chest dated  04/07/2016. The patient was nauseous throughout the exam. Electronically Signed   By: Kathreen Devoid   On: 05/13/2016 11:38   US Abdomen Limited Ruq  Result Date: 05/18/2016 CLINICAL DATA:  Cholelithiasis. Right upper quadrant pain and nausea for 2 days. EXAM: US ABDOMEN LIMITED - RIGHT UPPER QUADRANT COMPARISON:  Radionuclide hepatobiliary scan of 05/18/2016 FINDINGS: Gallbladder: Multiple calculi are present within the gallbladder lumen, measuring up 1.6 cm. No gallbladder mural thickening or pericholecystic fluid. Gallbladder wall measures 2.6 mm. The patient was not tender to probe pressure over the gallbladder. Common bile duct: Diameter: Normal, 5 mm. Liver: Mildly increased echogenicity of hepatic parenchyma consistent with fatty infiltration. No focal liver lesion. IMPRESSION: Cholelithiasis without sonographic evidence of acute cholecystitis. Mild hepatic steatosis. Electronically Signed   By: Andreas Newport M.D.   On: 05/18/2016 19:12    Assessment and Plan:   KHAI FOCKLER is a 66 y.o. y/o female here for a hospital follow up . She underwent a cholecystectomy but has persistent nausea. Gastric emptying study suggests severe gastroparesis likely secondary to long standing diabetes. She also suffers from constipation.   1. Tight glycemic control , keep sugars <150 mg/dl , will check Hba1c. I  Will get an EGD to rule out gastric outlet obstruction   2. Low fiber diet , can have liquids more often , solids small qty preferably every 3-4 hours rather than every 2 hours. Low fat meals. ( has ham for breakfast). She has been consuming a lot of chicken salad.   3. Constipation - miralax daily , if required can change to linzess.   Dr Jonathon Bellows  MD  Follow up in 6 weeks.

## 2016-06-02 NOTE — Patient Instructions (Addendum)
° °Gastroparesis °Introduction °Gastroparesis, also called delayed gastric emptying, is a condition in which food takes longer than normal to empty from the stomach. The condition is usually long-lasting (chronic). °What are the causes? °This condition may be caused by: °· An endocrine disorder, such as hypothyroidism or diabetes. Diabetes is the most common cause of this condition. °· A nervous system disease, such as Parkinson disease or multiple sclerosis. °· Cancer, infection, or surgery of the stomach or vagus nerve. °· A connective tissue disorder, such as scleroderma. °· Certain medicines. °In most cases, the cause is not known. °What increases the risk? °This condition is more likely to develop in: °· People with certain disorders, including endocrine disorders, eating disorders, amyloidosis, and scleroderma. °· People with certain diseases, including Parkinson disease or multiple sclerosis. °· People with cancer or infection of the stomach or vagus nerve. °· People who have had surgery on the stomach or vagus nerve. °· People who take certain medicines. °· Women. °What are the signs or symptoms? °Symptoms of this condition include: °· An early feeling of fullness when eating. °· Nausea. °· Weight loss. °· Vomiting. °· Heartburn. °· Abdominal bloating. °· Inconsistent blood glucose levels. °· Lack of appetite. °· Acid from the stomach coming up into the esophagus (gastroesophageal reflux). °· Spasms of the stomach. °Symptoms may come and go. °How is this diagnosed? °This condition is diagnosed with tests, such as: °· Tests that check how long it takes food to move through the stomach and intestines. These tests include: °¨ Upper gastrointestinal (GI) series. In this test, X-rays of the intestines are taken after you drink a liquid. The liquid makes the intestines show up better on the X-rays. °¨ Gastric emptying scintigraphy. In this test, scans are taken after you eat food that contains a small amount of  radioactive material. °¨ Wireless capsule GI monitoring system. This test involves swallowing a capsule that records information about movement through the stomach. °· Gastric manometry. This test measures electrical and muscular activity in the stomach. It is done with a thin tube that is passed down the throat and into the stomach. °· Endoscopy. This test checks for abnormalities in the lining of the stomach. It is done with a long, thin tube that is passed down the throat and into the stomach. °· An ultrasound. This test can help rule out gallbladder disease or pancreatitis as a cause of your symptoms. It uses sound waves to take pictures of the inside of your body. °How is this treated? °There is no cure for gastroparesis. This condition may be managed with: °· Treatment of the underlying condition causing the gastroparesis. °· Lifestyle changes, including exercise and dietary changes. Dietary changes can include: °¨ Changes in what and when you eat. °¨ Eating smaller meals more often. °¨ Eating low-fat foods. °¨ Eating low-fiber forms of high-fiber foods, such as cooked vegetables instead of raw vegetables. °¨ Having liquid foods in place of solid foods. Liquid foods are easier to digest. °· Medicines. These may be given to control nausea and vomiting and to stimulate stomach muscles. °· Getting food through a feeding tube. This may be done in severe cases. °· A gastric neurostimulator. This is a device that is inserted into the body with surgery. It helps improve stomach emptying and control nausea and vomiting. °Follow these instructions at home: °· Follow your health care provider's instructions about exercise and diet. °· Take medicines only as directed by your health care provider. °Contact a health care provider   if: °· Your symptoms do not improve with treatment. °· You have new symptoms. °Get help right away if: °· You have severe abdominal pain that does not improve with treatment. °· You have nausea  that does not go away. °· You cannot keep fluids down. °This information is not intended to replace advice given to you by your health care provider. Make sure you discuss any questions you have with your health care provider. °Document Released: 05/17/2005 Document Revised: 10/23/2015 Document Reviewed: 05/13/2014 °© 2017 Elsevier ° °

## 2016-06-07 ENCOUNTER — Encounter: Payer: Self-pay | Admitting: General Surgery

## 2016-06-07 ENCOUNTER — Ambulatory Visit (INDEPENDENT_AMBULATORY_CARE_PROVIDER_SITE_OTHER): Payer: Medicare Other | Admitting: General Surgery

## 2016-06-07 VITALS — BP 134/74 | HR 69 | Temp 98.2°F | Ht 63.0 in | Wt 174.2 lb

## 2016-06-07 DIAGNOSIS — Z4889 Encounter for other specified surgical aftercare: Secondary | ICD-10-CM

## 2016-06-07 NOTE — Progress Notes (Signed)
Outpatient Surgical Follow Up  06/07/2016  Dorothy Rogers is an 66 y.o. female.   Chief Complaint  Patient presents with  . Routine Post Op    Laparoscopy Cholecystectomy-05/19/16-Dr.Micah Barnier    HPI: 66 year old female returns to clinic for follow-up from a laparoscopic cholecystectomy 2 weeks ago. She is also less than 3 months status post a laparoscopic paraesophageal hernia repair. Her most recent surgery was done secondary to nonfunctioning gallbladder with nausea. She had some improvement in her nausea after remove her gallbladder but not complete. She also then had a gastric imaging study which confirmed that some aspect of gastroparesis. She continues to have nausea but states today as a good day with her regimen. She denies any fevers, chills, chest pain, shortness of breath, diarrhea, constipation, vomiting. She's been very happy with her surgical experience is far. She has been able to stop her antireflux medications.  Past Medical History:  Diagnosis Date  . Anemia   . Anemia   . Arthritis    lower back, hips  . Blood transfusion without reported diagnosis   . CAD (coronary artery disease) FEB AND NOV 2009   6 STENTS  . 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 Cataract   . Chronic kidney disease    Stage III  . Coronary artery disease   . DDD (degenerative disc disease), lumbar   . Diabetes mellitus without complication ([place])    TYPE 2  . Herpes simplex   . Hyperlipidemia   . Hypertension    CONTROLLED ON MEDS  . Vertigo    occasional, no episodes in 2-3 months  . Vertigo    OCCASIONALLY  . Vitamin B 12 deficiency     Past Surgical History:  Procedure Laterality Date  . [place] STUDY N/A 02/04/2016   Procedure: [place] STUDY;  Surgeon: Lucilla Lame, MD;  Location: [facility];  Service: Endoscopy;  Laterality: N/A;  . ABDOMINAL HYSTERECTOMY    . BACK SURGERY     Lumbar spinal fusion 05/30/2015  . CARDIAC CATHETERIZATION  2009  .  CATARACT EXTRACTION Left   . CATARACT EXTRACTION W/PHACO Right 03/19/2015   Procedure: CATARACT EXTRACTION PHACO AND INTRAOCULAR LENS PLACEMENT ([place]);  Surgeon: Leandrew Koyanagi, MD;  Location: [facility];  Service: Ophthalmology;  Laterality: Right;  DIABETIC - insulin and oral meds  . CHOLECYSTECTOMY N/A 05/19/2016   Procedure: LAPAROSCOPIC CHOLECYSTECTOMY;  Surgeon: Clayburn Pert, MD;  Location: [facility] ORS;  Service: General;  Laterality: N/A;  . CORONARY STENT PLACEMENT  03/2008  . CYSTECTOMY Right    wrist  . ESOPHAGEAL MANOMETRY N/A 02/04/2016   Procedure: ESOPHAGEAL MANOMETRY (EM);  Surgeon: Lucilla Lame, MD;  Location: [facility];  Service: Endoscopy;  Laterality: N/A;  . ESOPHAGOGASTRODUODENOSCOPY (EGD) WITH PROPOFOL N/A 09/04/2015   Procedure: ESOPHAGOGASTRODUODENOSCOPY (EGD) WITH PROPOFOL;  Surgeon: Lucilla Lame, MD;  Location: [facility];  Service: Endoscopy;  Laterality: N/A;  INSULIN DEPENDENT DIABETIC  . EYE SURGERY Left    cataract extraction  . LUMBAR FUSION  2001, 2014, 2015  . [place]     2001, 2014, 2015  . TUMOR EXCISION     back of neck    Family History  Problem Relation Age of Onset  . Heart disease Father   . Diabetes Other   . Breast cancer Neg Hx     Social History:  reports that she has never smoked. She has never used smokeless tobacco. She reports that she drinks  alcohol. She reports that she does not use drugs.  Allergies:  Allergies  Allergen Reactions  . Tramadol Swelling  . Beta Adrenergic Blockers Hives  . Celebrex [Celecoxib] Other (See Comments)    A lot of pressure in head  . Niacin Other (See Comments)    flushing  . Niacin And Related Other (See Comments)    Flushing and hot sensation  . Oxycodone Nausea Only  . Shellfish Allergy Nausea And Vomiting  . Vicodin [Hydrocodone-Acetaminophen] Other (See Comments)    Hypoglycemic episodes  . Heparin Rash    Medications reviewed.    ROS A multipoint review  of systems was completed. All pertinent positives and negatives are documented within the history of present illness and remainder are negative.   BP 134/74   Pulse 69   Temp 98.2 F (36.8 C) (Oral)   Ht 5\' 3"  (1.6 m)   Wt 79 kg (174 lb 3.2 oz)   BMI 30.86 kg/m   Physical Exam Gen.: No acute distress Chest: Clear to auscultation Heart: Regular rhythm Abdomen: Soft, nontender, nondistended. Well approximated and healing laparoscopic incision sites evidence of erythema or drainage.    No results found for this or any previous visit (from the past 48 hour(s)). No results found.  Assessment/Plan:  1. Aftercare following surgery 67 year old female status post laparoscopic cholecystectomy. Doing well from a postsurgical standpoint. Gastroparesis and chronic nausea now being treated by the gastroenterology department. Provided with standard postoperative precautions and indications for returning to clinic. All questions were answered to the patient and her husband's satisfaction and they will follow-up in clinic on an as-needed basis.     Clayburn Pert, MD FACS General Surgeon  06/07/2016,2:39 PM

## 2016-06-07 NOTE — Patient Instructions (Signed)
Please call our office if you have questions or concerns.   

## 2016-06-11 ENCOUNTER — Telehealth: Payer: Self-pay

## 2016-06-11 ENCOUNTER — Other Ambulatory Visit: Payer: Self-pay

## 2016-06-11 NOTE — Telephone Encounter (Signed)
Spoke with pt's husband and advised him we are waiting on the clearance from Dr. Ubaldo Glassing before scheduling EGD. I have left message at his office to please include the clearance in his office note when he completes it.

## 2016-06-11 NOTE — Telephone Encounter (Signed)
Coralyn Mark called in behalf of wife, Dorothy Rogers. They received the blood thinner clearance but haven't heard back in regards to scheduling an EGD. Please call patient and advice

## 2016-06-16 DIAGNOSIS — E1122 Type 2 diabetes mellitus with diabetic chronic kidney disease: Secondary | ICD-10-CM | POA: Diagnosis not present

## 2016-06-16 DIAGNOSIS — I2510 Atherosclerotic heart disease of native coronary artery without angina pectoris: Secondary | ICD-10-CM | POA: Insufficient documentation

## 2016-06-16 DIAGNOSIS — Z794 Long term (current) use of insulin: Secondary | ICD-10-CM | POA: Diagnosis not present

## 2016-06-16 DIAGNOSIS — N289 Disorder of kidney and ureter, unspecified: Secondary | ICD-10-CM | POA: Insufficient documentation

## 2016-06-16 DIAGNOSIS — R112 Nausea with vomiting, unspecified: Secondary | ICD-10-CM | POA: Diagnosis present

## 2016-06-16 DIAGNOSIS — N183 Chronic kidney disease, stage 3 (moderate): Secondary | ICD-10-CM | POA: Insufficient documentation

## 2016-06-16 DIAGNOSIS — R1030 Lower abdominal pain, unspecified: Secondary | ICD-10-CM | POA: Diagnosis not present

## 2016-06-16 DIAGNOSIS — Z79899 Other long term (current) drug therapy: Secondary | ICD-10-CM | POA: Insufficient documentation

## 2016-06-16 DIAGNOSIS — I129 Hypertensive chronic kidney disease with stage 1 through stage 4 chronic kidney disease, or unspecified chronic kidney disease: Secondary | ICD-10-CM | POA: Diagnosis not present

## 2016-06-16 DIAGNOSIS — Z7982 Long term (current) use of aspirin: Secondary | ICD-10-CM | POA: Diagnosis not present

## 2016-06-16 NOTE — ED Triage Notes (Signed)
Pt presents to ED via wheelchair with c/o vomiting x 1 hr PTA. (+) abdominal cramping. Pt dx with gastroparesis. Reglan at 9 pm and Zofran at 930 pm without relief.

## 2016-06-17 ENCOUNTER — Emergency Department
Admission: EM | Admit: 2016-06-17 | Discharge: 2016-06-17 | Disposition: A | Discharge status: Home / Self Care | Payer: Medicare Other | Attending: Emergency Medicine | Admitting: Emergency Medicine

## 2016-06-17 ENCOUNTER — Encounter: Payer: Self-pay | Admitting: Emergency Medicine

## 2016-06-17 ENCOUNTER — Emergency Department: Payer: Medicare Other

## 2016-06-17 DIAGNOSIS — N289 Disorder of kidney and ureter, unspecified: Secondary | ICD-10-CM

## 2016-06-17 DIAGNOSIS — R112 Nausea with vomiting, unspecified: Secondary | ICD-10-CM

## 2016-06-17 DIAGNOSIS — R1030 Lower abdominal pain, unspecified: Secondary | ICD-10-CM

## 2016-06-17 DIAGNOSIS — R197 Diarrhea, unspecified: Secondary | ICD-10-CM

## 2016-06-17 LAB — BASIC METABOLIC PANEL
Anion gap: 9 (ref 5–15)
BUN: 47 mg/dL — AB (ref 6–20)
CHLORIDE: 107 mmol/L (ref 101–111)
CO2: 19 mmol/L — AB (ref 22–32)
CREATININE: 2.08 mg/dL — AB (ref 0.44–1.00)
Calcium: 7.7 mg/dL — ABNORMAL LOW (ref 8.9–10.3)
GFR calc Af Amer: 27 mL/min — ABNORMAL LOW (ref >60)
GFR calc non Af Amer: 24 mL/min — ABNORMAL LOW (ref >60)
Glucose, Bld: 320 mg/dL — ABNORMAL HIGH (ref 65–99)
Potassium: 5 mmol/L (ref 3.5–5.1)
Sodium: 135 mmol/L (ref 135–145)

## 2016-06-17 LAB — COMPREHENSIVE METABOLIC PANEL
ALK PHOS: 84 U/L (ref 38–126)
ALT: 47 U/L (ref 14–54)
AST: 39 U/L (ref 15–41)
Albumin: 4 g/dL (ref 3.5–5.0)
Anion gap: 8 (ref 5–15)
BUN: 44 mg/dL — ABNORMAL HIGH (ref 6–20)
CALCIUM: 8.4 mg/dL — AB (ref 8.9–10.3)
CO2: 21 mmol/L — ABNORMAL LOW (ref 22–32)
CREATININE: 2.27 mg/dL — AB (ref 0.44–1.00)
Chloride: 109 mmol/L (ref 101–111)
GFR, EST AFRICAN AMERICAN: 25 mL/min — AB (ref >60)
GFR, EST NON AFRICAN AMERICAN: 21 mL/min — AB (ref >60)
Glucose, Bld: 232 mg/dL — ABNORMAL HIGH (ref 65–99)
Potassium: 4.3 mmol/L (ref 3.5–5.1)
Sodium: 138 mmol/L (ref 135–145)
Total Bilirubin: 0.7 mg/dL (ref 0.3–1.2)
Total Protein: 7.5 g/dL (ref 6.5–8.1)

## 2016-06-17 LAB — GASTROINTESTINAL PANEL BY PCR, STOOL (REPLACES STOOL CULTURE)
ADENOVIRUS F40/41: NOT DETECTED
ASTROVIRUS: NOT DETECTED
Campylobacter species: NOT DETECTED
Cryptosporidium: NOT DETECTED
Cyclospora cayetanensis: NOT DETECTED
ENTAMOEBA HISTOLYTICA: NOT DETECTED
ENTEROAGGREGATIVE E COLI (EAEC): NOT DETECTED
ENTEROTOXIGENIC E COLI (ETEC): NOT DETECTED
Enteropathogenic E coli (EPEC): NOT DETECTED
Giardia lamblia: NOT DETECTED
NOROVIRUS GI/GII: NOT DETECTED
Plesimonas shigelloides: NOT DETECTED
Rotavirus A: NOT DETECTED
Salmonella species: NOT DETECTED
Sapovirus (I, II, IV, and V): NOT DETECTED
Shiga like toxin producing E coli (STEC): NOT DETECTED
Shigella/Enteroinvasive E coli (EIEC): NOT DETECTED
VIBRIO CHOLERAE: NOT DETECTED
Vibrio species: NOT DETECTED
Yersinia enterocolitica: NOT DETECTED

## 2016-06-17 LAB — URINALYSIS, COMPLETE (UACMP) WITH MICROSCOPIC
Bacteria, UA: NONE SEEN
Bilirubin Urine: NEGATIVE
GLUCOSE, UA: 150 mg/dL — AB
Hgb urine dipstick: NEGATIVE
KETONES UR: NEGATIVE mg/dL
Nitrite: NEGATIVE
PH: 5 (ref 5.0–8.0)
Protein, ur: NEGATIVE mg/dL
Specific Gravity, Urine: 1.015 (ref 1.005–1.030)

## 2016-06-17 LAB — CBC
HCT: 34.3 % — ABNORMAL LOW (ref 35.0–47.0)
Hemoglobin: 11.7 g/dL — ABNORMAL LOW (ref 12.0–16.0)
MCH: 32.1 pg (ref 26.0–34.0)
MCHC: 34.2 g/dL (ref 32.0–36.0)
MCV: 94.1 fL (ref 80.0–100.0)
PLATELETS: 206 10*3/uL (ref 150–440)
RBC: 3.65 MIL/uL — AB (ref 3.80–5.20)
RDW: 14.5 % (ref 11.5–14.5)
WBC: 9.2 10*3/uL (ref 3.6–11.0)

## 2016-06-17 LAB — TROPONIN I: Troponin I: <0.03 ng/mL (ref <0.03)

## 2016-06-17 LAB — C DIFFICILE QUICK SCREEN W PCR REFLEX
C DIFFICLE (CDIFF) ANTIGEN: NEGATIVE
C Diff interpretation: NOT DETECTED
C Diff toxin: NEGATIVE

## 2016-06-17 LAB — LIPASE, BLOOD: Lipase: 51 U/L (ref 11–51)

## 2016-06-17 MED ORDER — IOPAMIDOL (ISOVUE-300) INJECTION 61%
15.0000 mL | INTRAVENOUS | Status: AC
  Administered 2016-06-17: 15 mL via ORAL

## 2016-06-17 MED ORDER — HYDROMORPHONE HCL 1 MG/ML IJ SOLN
1.0000 mg | Freq: Once | INTRAMUSCULAR | Status: DC
  Filled 2016-06-17: qty 1

## 2016-06-17 MED ORDER — SODIUM CHLORIDE 0.9 % IV BOLUS (SEPSIS)
1000.0000 mL | Freq: Once | INTRAVENOUS | Status: AC
  Administered 2016-06-17: 1000 mL via INTRAVENOUS

## 2016-06-17 MED ORDER — PROMETHAZINE HCL 25 MG/ML IJ SOLN
25.0000 mg | Freq: Once | INTRAMUSCULAR | Status: AC
  Administered 2016-06-17: 25 mg via INTRAVENOUS
  Filled 2016-06-17: qty 1

## 2016-06-17 NOTE — ED Provider Notes (Signed)
[facility] Emergency Department Provider Note  ____________________________________________  Time seen: Approximately 12:34 AM  I have reviewed the triage vital signs and the nursing notes.   HISTORY  Chief Complaint Emesis    HPI Dorothy Rogers is a 66 y.o. female with a history of gastroparesis on daily Zofran and Reglan, HTN, DM 2, CAD, status post cholecystectomy and hysterectomy, presenting with nausea vomiting and diarrhea, lower abdominal cramping. The patient reports that at 9:30 this evening she developed cramping in the lower abdomen associated with vomiting that did not respond to Zofran and Reglan. No fevers, urinary symptoms. She is also having diarrhea, which is not typical for her.   Past Medical History:  Diagnosis Date  . Anemia   . Anemia   . Arthritis    lower back, hips  . Blood transfusion without reported diagnosis   . CAD (coronary artery disease) FEB AND NOV 2009   6 STENTS  . 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 Cataract   . Chronic kidney disease    Stage III  . Coronary artery disease   . DDD (degenerative disc disease), lumbar   . Diabetes mellitus without complication ([place])    TYPE 2  . Herpes simplex   . Hyperlipidemia   . Hypertension    CONTROLLED ON MEDS  . Vertigo    occasional, no episodes in 2-3 months  . Vertigo    OCCASIONALLY  . Vitamin B 12 deficiency     Patient Active Problem List   Diagnosis Date Noted  . Cholelithiasis   . Protein-calorie malnutrition, severe 05/18/2016  . Supraventricular tachycardia ([place]) 05/05/2016  . SOB (shortness of breath)   . Near syncope 04/07/2016  . 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 Non-intractable cyclical vomiting with nausea   . Congenital esophageal defect   . Hiatal hernia   . Gastritis   . Diarrhea 07/10/2015  . Diabetes mellitus ([place]) 07/02/2015  . Adjustment disorder 06/16/2015  . Abdominal pain 06/16/2015  . C. difficile  colitis 06/16/2015  . Intractable nausea and vomiting 06/09/2015  . Dehydration 06/09/2015  . Type 2 diabetes mellitus ([place]) 06/09/2015  . HLD (hyperlipidemia) 06/09/2015  . HTN (hypertension) 06/09/2015  . CAD (coronary artery disease) 06/09/2015  . CKD (chronic kidney disease), stage III 06/09/2015  . Thrombocytopenia ([place]) 05/30/2015  . Lumbar canal stenosis 05/30/2015  . Disc disease with myelopathy, lumbar 05/30/2015  . Presence of stent in coronary artery 05/30/2015  . 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 Status post lumbar spinal fusion 12/25/2014  . DDD (degenerative disc disease), lumbar 11/18/2014  . Lumbar post-laminectomy syndrome 11/18/2014  . Facet syndrome, lumbar 11/18/2014  . Piriformis syndrome of left side 11/18/2014  . Status post lumbar spine operation 05/09/2014  . Adiposity 04/23/2014  . Thoracic and lumbosacral neuritis 03/27/2013  . H/O arthrodesis 03/27/2013  . Peripheral vascular disease ([place]) 03/22/2013  . Hypomagnesemia 03/22/2013  . Absolute anemia 03/22/2013    Past Surgical History:  Procedure Laterality Date  . [place] STUDY N/A 02/04/2016   Procedure: [place] STUDY;  Surgeon: Lucilla Lame, MD;  Location: [facility] ENDOSCOPY;  Service: Endoscopy;  Laterality: N/A;  . ABDOMINAL HYSTERECTOMY    . BACK SURGERY     Lumbar spinal fusion 05/30/2015  . CARDIAC CATHETERIZATION  2009  . CATARACT EXTRACTION Left   . CATARACT EXTRACTION W/PHACO Right 03/19/2015   Procedure: CATARACT EXTRACTION PHACO AND INTRAOCULAR LENS PLACEMENT ([place]);  Surgeon: Leandrew Koyanagi,  MD;  Location: [place];  Service: Ophthalmology;  Laterality: Right;  DIABETIC - insulin and oral meds  . CHOLECYSTECTOMY N/A 05/19/2016   Procedure: LAPAROSCOPIC CHOLECYSTECTOMY;  Surgeon: Clayburn Pert, MD;  Location: [facility] ORS;  Service: General;  Laterality: N/A;  . CORONARY STENT PLACEMENT  03/2008  . CYSTECTOMY Right    wrist  . ESOPHAGEAL MANOMETRY N/A 02/04/2016    Procedure: ESOPHAGEAL MANOMETRY (EM);  Surgeon: Lucilla Lame, MD;  Location: [facility] ENDOSCOPY;  Service: Endoscopy;  Laterality: N/A;  . ESOPHAGOGASTRODUODENOSCOPY (EGD) WITH PROPOFOL N/A 09/04/2015   Procedure: ESOPHAGOGASTRODUODENOSCOPY (EGD) WITH PROPOFOL;  Surgeon: Lucilla Lame, MD;  Location: [place];  Service: Endoscopy;  Laterality: N/A;  INSULIN DEPENDENT DIABETIC  . EYE SURGERY Left    cataract extraction  . LUMBAR FUSION  2001, 2014, 2015  . [place]     2001, 2014, 2015  . TUMOR EXCISION     back of neck    Current Outpatient Rx  . Order #: [id number] Class: Print  . Order #: [id number] Class: Historical Med  . Order #: [id number] Class: OTC  . Order #: [id number] Class: Historical Med  . Order #: [id number] Class: Historical Med  . Order #: [id number] Class: Historical Med  . Order #: [id number] Class: Historical Med  . Order #: [id number] Class: Normal  . Order #: [id number] Class: Historical Med  . Order #: [id number] Class: Historical Med  . Order #: [id number] Class: Historical Med  . Order #: [id number] Class: Historical Med  . Order #: [id number] Class: Print  . Order #: [id number] Class: Historical Med  . Order #: [id number] Class: Historical Med  . Order #: [id number] Class: Historical Med  . Order #: [id number] Class: Normal  . Order #: [id number] Class: Print  . Order #: [id number] Class: Print  . Order #: [id number] Class: Historical Med    Allergies Tramadol; Beta adrenergic blockers; Celebrex [celecoxib]; Niacin; Niacin and related; Oxycodone; Shellfish allergy; Vicodin [hydrocodone-acetaminophen]; and Heparin  Family History  Problem Relation Age of Onset  . Heart disease Father   . Diabetes Other   . Breast cancer Neg Hx     Social History Social History  Substance Use Topics  . Smoking status: Never Smoker  . Smokeless tobacco: Never Used  . Alcohol use 0.0 oz/week     Comment: 2-3 drinks per year    Review of Systems Constitutional: No fever/chills.No  lightheadedness or syncope. Eyes: No visual changes. ENT: No sore throat. No congestion or rhinorrhea. Cardiovascular: Denies chest pain. Denies palpitations. Respiratory: Denies shortness of breath.  No cough. Gastrointestinal: Lower abdominal cramping.  Positive nausea, positive vomiting.  Positive diarrhea.  No constipation. Genitourinary: Negative for dysuria. Musculoskeletal: Negative for back pain. Skin: Negative for rash. Neurological: Negative for headaches. No focal numbness, tingling or weakness.   10-point ROS otherwise negative.  ____________________________________________   PHYSICAL EXAM:  VITAL SIGNS: ED Triage Vitals  Enc Vitals Group     BP 06/17/16 0000 (!) 152/75     Pulse Rate 06/17/16 0000 (!) 134     Resp 06/17/16 0000 20     Temp 06/17/16 0000 98.5 F (36.9 C)     Temp Source 06/17/16 0000 Oral     SpO2 06/17/16 0000 98 %     Weight 06/17/16 0001 174 lb (78.9 kg)     Height 06/17/16 0001 5\' 3"  (1.6 m)     Head Circumference --      Peak Flow --      Pain Score 06/17/16 0001 8     Pain Loc --  Pain Edu? --      Excl. in [place]? --     Constitutional: Alert and oriented. Uncomfortable appearing but in no acute distress. Answers questions appropriately. Eyes: Conjunctivae are normal.  EOMI. No scleral icterus. Head: Atraumatic. Nose: No congestion/rhinnorhea. Mouth/Throat: Mucous membranes are moist.  Neck: No stridor.  Supple.  No JVD. No meningismus. Cardiovascular: Normal rate, regular rhythm. No murmurs, rubs or gallops.  Respiratory: Normal respiratory effort.  No accessory muscle use or retractions. Lungs CTAB.  No wheezes, rales or ronchi. Gastrointestinal: Soft,  and nondistended.  Diffusely tender to palpation in lower abdomen without focality. No guarding or rebound.  No peritoneal signs. Musculoskeletal: No LE edema. . Neurologic:  A&Ox3.  Speech is clear.  Face and smile are symmetric.  EOMI.  Moves all extremities well. Skin:  Skin is  warm, dry and intact. No rash noted. Psychiatric: Mood and affect are normal. Speech and behavior are normal.  Normal judgement.  ____________________________________________   LABS (all labs ordered are listed, but only abnormal results are displayed)  Labs Reviewed  COMPREHENSIVE METABOLIC PANEL - Abnormal; Notable for the following:       Result Value   CO2 21 (*)    Glucose, Bld 232 (*)    BUN 44 (*)    Creatinine, Ser 2.27 (*)    Calcium 8.4 (*)    GFR calc non Af Amer 21 (*)    GFR calc Af Amer 25 (*)    All other components within normal limits  CBC - Abnormal; Notable for the following:    RBC 3.65 (*)    Hemoglobin 11.7 (*)    HCT 34.3 (*)    All other components within normal limits  URINALYSIS, COMPLETE (UACMP) WITH MICROSCOPIC - Abnormal; Notable for the following:    Color, Urine STRAW (*)    APPearance CLEAR (*)    Glucose, UA 150 (*)    Leukocytes, UA TRACE (*)    Squamous Epithelial / LPF 0-5 (*)    All other components within normal limits  GASTROINTESTINAL PANEL BY PCR, STOOL (REPLACES STOOL CULTURE)  C DIFFICILE QUICK SCREEN W PCR REFLEX  LIPASE, BLOOD  TROPONIN I  BASIC METABOLIC PANEL   ____________________________________________  EKG  ED ECG REPORT I, Eula Listen, the attending physician, personally viewed and interpreted this ECG.   Date: 06/17/2016  EKG Time: 0702   Rate: 134  Rhythm: sinus tachycardia  Axis: normal  Intervals:none  ST&T Change: Nonspecific T-wave inversions in V1. No ST elevation  ____________________________________________  RADIOLOGY  Ct Abdomen Pelvis Wo Contrast  Result Date: 06/17/2016 CLINICAL DATA:  Lower abdominal pain.  Nausea and vomiting. EXAM: CT ABDOMEN AND PELVIS WITHOUT CONTRAST TECHNIQUE: Multidetector CT imaging of the abdomen and pelvis was performed following the standard protocol without IV contrast. COMPARISON:  CT 05/05/2016. FINDINGS: Lower chest: 4 mm pulmonary nodule in the right  lower lobe, image 5 series 34, not previously and it in the field of view unchanged size of 5 mm right middle lobe pulmonary nodule. No consolidation or pleural fluid. Coronary artery calcifications versus stents. Calcified nodule versus calcified node at the right hilum. Hepatobiliary: No focal hepatic lesion allowing for lack contrast. Clips in the gallbladder fossa postcholecystectomy. No biliary dilatation. Pancreas: No ductal dilatation or inflammation. Spleen: Scattered calcified granuloma. Normal in size. Splenule noted inferiorly adjacent to the pancreatic tail. Adrenals/Urinary Tract: No adrenal nodule. Lobular renal contours and thinning of renal parenchyma. No hydronephrosis or perinephric edema. Calcification in the  left kidney felt to be vascular in etiology. Urinary bladder is decompressed. Stomach/Bowel: Patulous distal esophagus containing minimal intraluminal contrast. Soft tissue density at the gastroesophageal junction may be sequela of prior Nissen. Stomach is distended with ingested contrast. Small duodenum diverticulum, no inflammation. No bowel obstruction with enteric contrast reaching the colon. There is mild fecalization of distal small bowel contents suggesting slow transit. Moderate stool burden without colonic inflammation. Appendix not confidently identified, no pericecal inflammation. Vascular/Lymphatic: Abdominal atherosclerosis without aneurysm. No adenopathy. Reproductive: Status post hysterectomy. No adnexal masses. Other: No free air, free fluid, or intra-abdominal fluid collection. Postsurgical change of the anterior abdominal wall. Musculoskeletal: Posterior fusion hardware L2 through L4. Interbody spacer at L5-S1. There are no acute or suspicious osseous abnormalities. IMPRESSION: 1. No acute abnormality in the abdomen/pelvis. 2. Mild fecalization of distal small bowel contents suggesting slow transit. No evidence of obstruction or bowel inflammation. 3. Right lung base  pulmonary nodules, likely a sequela of prior granulomatous disease. Middle lobe nodule is unchanged from prior. Lower lobe nodule not previously included in the field of view. 4. Atherosclerosis.  Coronary artery calcifications versus stents. Electronically Signed   By: Jeb Levering M.D.   On: 06/17/2016 03:15    ____________________________________________   PROCEDURES  Procedure(s) performed: None  Procedures  Critical Care performed: No ____________________________________________   INITIAL IMPRESSION / ASSESSMENT AND PLAN / ED COURSE  Pertinent labs & imaging results that were available during my care of the patient were reviewed by me and considered in my medical decision making (see chart for details).  66 y.o. female with a history of gastroparesis presenting with nausea vomiting and diarrhea, as well as lower abdominal pain. On examination, the patient is tachycardic and appears grossly uncomfortable. Her symptoms today would be atypical for gastroparesis alone. I would consider a viral or foodborne GI illness, influenza, as well as intra-abdominal pathology including diverticulitis or partial small bowel injection. We'll get a CT scan for further evaluation. She does have some renal insufficiency today, likely from dehydration from her fluid loss.   ----------------------------------------- 2:42 AM on 06/17/2016 -----------------------------------------  The patient is resting comfortably at this time. She is no longer vomiting. I am awaiting the results of her CT scan for final disposition.  ----------------------------------------- 3:26 AM on 06/17/2016 -----------------------------------------  The patient is feeling significantly better and able to tolerate liquid. Her CT scan does not show any acute illness. I will get a repeat basic metabolic panel to make sure her creatinine is trending in the right direction, and if there is improvement anticipate discharge home.  ____________________________________________  FINAL CLINICAL IMPRESSION(S) / ED DIAGNOSES  Final diagnoses:  Nausea vomiting and diarrhea  Lower abdominal pain  Acute renal insufficiency         NEW MEDICATIONS STARTED DURING THIS VISIT:  New Prescriptions   No medications on file      Eula Listen, MD 06/17/16 [phone]

## 2016-06-17 NOTE — ED Notes (Signed)
Pt completed one bottle of contrast, resting comfortably at this time, unable to give urine or stool sample at this time

## 2016-06-17 NOTE — ED Notes (Signed)
Pt reports hx gastroparesis, tonight nausea without relief from zofran or reglan, pt reports loose stool x 4 today, states takes miralax daily.

## 2016-06-17 NOTE — Discharge Instructions (Signed)
Please make an appointment with your primary care physician to have your kidney function rechecked, as it was abnormal today.  Please drink plenty of fluid to stay well hydrated. Take a clear liquid diet for the next 24-48 hours, then advance to a bland diet as tolerated. Return to the emergency department if you develop severe pain, fever, inability to keep down fluids, or any other symptoms concerning to you.

## 2016-06-22 ENCOUNTER — Other Ambulatory Visit: Payer: Self-pay

## 2016-06-22 NOTE — Telephone Encounter (Signed)
EGD with Dr. Vicente Males at [facility] on 06/29/16 for Gastroparesis K31.84

## 2016-06-28 ENCOUNTER — Encounter: Payer: Self-pay | Admitting: *Deleted

## 2016-06-29 ENCOUNTER — Ambulatory Visit: Payer: Medicare Other | Admitting: Anesthesiology

## 2016-06-29 ENCOUNTER — Ambulatory Visit
Admission: RE | Admit: 2016-06-29 | Discharge: 2016-06-29 | Disposition: A | Discharge status: Home / Self Care | Payer: Medicare Other | Source: Ambulatory Visit | Attending: Gastroenterology | Admitting: Gastroenterology

## 2016-06-29 ENCOUNTER — Encounter: Payer: Self-pay | Admitting: Anesthesiology

## 2016-06-29 ENCOUNTER — Encounter
Admission: RE | Disposition: A | Discharge status: Home / Self Care | Payer: Self-pay | Source: Ambulatory Visit | Attending: Gastroenterology

## 2016-06-29 DIAGNOSIS — E538 Deficiency of other specified B group vitamins: Secondary | ICD-10-CM | POA: Diagnosis not present

## 2016-06-29 DIAGNOSIS — E119 Type 2 diabetes mellitus without complications: Secondary | ICD-10-CM | POA: Diagnosis not present

## 2016-06-29 DIAGNOSIS — E1122 Type 2 diabetes mellitus with diabetic chronic kidney disease: Secondary | ICD-10-CM | POA: Diagnosis not present

## 2016-06-29 DIAGNOSIS — E1143 Type 2 diabetes mellitus with diabetic autonomic (poly)neuropathy: Secondary | ICD-10-CM | POA: Insufficient documentation

## 2016-06-29 DIAGNOSIS — I2510 Atherosclerotic heart disease of native coronary artery without angina pectoris: Secondary | ICD-10-CM | POA: Diagnosis not present

## 2016-06-29 DIAGNOSIS — Z955 Presence of coronary angioplasty implant and graft: Secondary | ICD-10-CM | POA: Insufficient documentation

## 2016-06-29 DIAGNOSIS — E785 Hyperlipidemia, unspecified: Secondary | ICD-10-CM | POA: Insufficient documentation

## 2016-06-29 DIAGNOSIS — K3184 Gastroparesis: Secondary | ICD-10-CM | POA: Diagnosis present

## 2016-06-29 DIAGNOSIS — I129 Hypertensive chronic kidney disease with stage 1 through stage 4 chronic kidney disease, or unspecified chronic kidney disease: Secondary | ICD-10-CM | POA: Insufficient documentation

## 2016-06-29 DIAGNOSIS — N183 Chronic kidney disease, stage 3 (moderate): Secondary | ICD-10-CM | POA: Insufficient documentation

## 2016-06-29 DIAGNOSIS — Z7982 Long term (current) use of aspirin: Secondary | ICD-10-CM | POA: Insufficient documentation

## 2016-06-29 DIAGNOSIS — D649 Anemia, unspecified: Secondary | ICD-10-CM | POA: Diagnosis not present

## 2016-06-29 DIAGNOSIS — Z794 Long term (current) use of insulin: Secondary | ICD-10-CM | POA: Insufficient documentation

## 2016-06-29 DIAGNOSIS — E1151 Type 2 diabetes mellitus with diabetic peripheral angiopathy without gangrene: Secondary | ICD-10-CM | POA: Diagnosis not present

## 2016-06-29 DIAGNOSIS — K219 Gastro-esophageal reflux disease without esophagitis: Secondary | ICD-10-CM | POA: Diagnosis not present

## 2016-06-29 DIAGNOSIS — Z7902 Long term (current) use of antithrombotics/antiplatelets: Secondary | ICD-10-CM | POA: Insufficient documentation

## 2016-06-29 DIAGNOSIS — Z79899 Other long term (current) drug therapy: Secondary | ICD-10-CM | POA: Insufficient documentation

## 2016-06-29 HISTORY — PX: ESOPHAGOGASTRODUODENOSCOPY (EGD) WITH PROPOFOL: SHX5813

## 2016-06-29 LAB — GLUCOSE, CAPILLARY: Glucose-Capillary: 169 mg/dL — ABNORMAL HIGH (ref 65–99)

## 2016-06-29 SURGERY — ESOPHAGOGASTRODUODENOSCOPY (EGD) WITH PROPOFOL
Anesthesia: General

## 2016-06-29 MED ORDER — PROPOFOL 500 MG/50ML IV EMUL
INTRAVENOUS | Status: DC | PRN
  Administered 2016-06-29: 180 ug/kg/min via INTRAVENOUS

## 2016-06-29 MED ORDER — GLYCOPYRROLATE 0.2 MG/ML IJ SOLN
INTRAMUSCULAR | Status: DC | PRN
  Administered 2016-06-29: 0.2 mg via INTRAVENOUS

## 2016-06-29 MED ORDER — LIDOCAINE 2% (20 MG/ML) 5 ML SYRINGE
INTRAMUSCULAR | Status: DC | PRN
  Administered 2016-06-29: 100 mg via INTRAVENOUS

## 2016-06-29 MED ORDER — SODIUM CHLORIDE 0.9 % IV SOLN
INTRAVENOUS | Status: DC
  Administered 2016-06-29: 1000 mL via INTRAVENOUS

## 2016-06-29 MED ORDER — PROPOFOL 10 MG/ML IV BOLUS
INTRAVENOUS | Status: DC | PRN
  Administered 2016-06-29: 60 mg via INTRAVENOUS

## 2016-06-29 MED ORDER — PROPOFOL 500 MG/50ML IV EMUL
INTRAVENOUS | Status: AC
  Filled 2016-06-29: qty 50

## 2016-06-29 NOTE — Anesthesia Postprocedure Evaluation (Signed)
Anesthesia Post Note  Patient: YASIRA DEMANCHE  Procedure(s) Performed: Procedure(s) (LRB): ESOPHAGOGASTRODUODENOSCOPY (EGD) WITH PROPOFOL (N/A)  Patient location during evaluation: Endoscopy Anesthesia Type: General Level of consciousness: awake and alert Pain management: pain level controlled Vital Signs Assessment: post-procedure vital signs reviewed and stable Respiratory status: spontaneous breathing, nonlabored ventilation, respiratory function stable and patient connected to nasal cannula oxygen Cardiovascular status: blood pressure returned to baseline and stable Postop Assessment: no signs of nausea or vomiting Anesthetic complications: no     Last Vitals:  Vitals:   06/29/16 0955 06/29/16 1005  BP: 119/64 129/67  Pulse: 80 82  Resp: 19 (!) 21  Temp:      Last Pain:  Vitals:   06/29/16 0936  TempSrc: Tympanic                 Martha Clan

## 2016-06-29 NOTE — Anesthesia Post-op Follow-up Note (Cosign Needed)
Anesthesia QCDR form completed.        

## 2016-06-29 NOTE — Op Note (Signed)
[facility] Patient Name: Dorothy Rogers Procedure Date: 06/29/2016 9:12 AM MRN: [id number] Account #: [id number] Date of Birth: 1951-05-19 Admit Type: Outpatient Age: 66 Room: [facility] ENDO ROOM 4 Gender: Female Note Status: Finalized Procedure:            Upper GI endoscopy Indications:          Gastroparesis Providers:            Jonathon Bellows MD, MD Referring MD:         Leonie Douglas. Doy Hutching, MD (Referring MD) Medicines:            Monitored Anesthesia Care Complications:        No immediate complications. Procedure:            Pre-Anesthesia Assessment:                       - Prior to the procedure, a History and Physical was                        performed, and patient medications, allergies and                        sensitivities were reviewed. The patient's tolerance of                        previous anesthesia was reviewed.                       - The risks and benefits of the procedure and the                        sedation options and risks were discussed with the                        patient. All questions were answered and informed                        consent was obtained.                       - The risks and benefits of the procedure and the                        sedation options and risks were discussed with the                        patient. All questions were answered and informed                        consent was obtained.                       - ASA Grade Assessment: III - A patient with severe                        systemic disease.                       After obtaining informed consent, the endoscope was  passed under direct vision. Throughout the procedure,                        the patient's blood pressure, pulse, and oxygen                        saturations were monitored continuously. The Endoscope                        was introduced through the mouth, and advanced to the       third part of duodenum. The upper GI endoscopy was                        accomplished with ease. The patient tolerated the                        procedure well. Findings:      The esophagus was normal.      The examined duodenum was normal.      A large amount of food (residue) was found in the entire examined       stomach.      Evidence of a Nissen fundoplication was found in the cardia. The wrap       appeared intact. This was traversed. Impression:           - Normal esophagus.                       - Normal examined duodenum.                       - A large amount of food (residue) in the stomach.                       - No specimens collected. Recommendation:       - Discharge patient to home (with escort).                       - Resume previous diet.                       - Continue present medications.                       - Return to my office in 8 weeks. Procedure Code(s):    --- Professional ---                       [phone], Esophagogastroduodenoscopy, flexible, transoral;                        diagnostic, including collection of specimen(s) by                        brushing or washing, when performed (separate procedure) Diagnosis Code(s):    --- Professional ---                       K31.84, Gastroparesis CPT copyright 2016 [employer]. All rights reserved. The codes documented in this report are preliminary and upon coder review may  be revised to meet current compliance requirements. Jonathon Bellows, MD Jonathon Bellows MD, MD 06/29/2016 9:31:43 AM This  report has been signed electronically. Number of Addenda: 0 Note Initiated On: 06/29/2016 9:12 AM      [facility]

## 2016-06-29 NOTE — H&P (Signed)
Dorothy Bellows MD [address] [place], [place] [address] Phone: [phone] Fax : [phone]  Primary Care Physician:  Idelle Crouch, MD Primary Gastroenterologist:  Dr. Jonathon Rogers   Pre-Procedure History & Physical: HPI:  Dorothy Rogers is a 66 y.o. female is here for an endoscopy.   Past Medical History:  Diagnosis Date  . Anemia   . Anemia   . Arthritis    lower back, hips  . Blood transfusion without reported diagnosis   . CAD (coronary artery disease) FEB AND NOV 2009   6 STENTS  . Cancer ([place])    tumor back of neck-fibroushistocytoma  . Cataract   . Chronic kidney disease    Stage III  . Coronary artery disease   . DDD (degenerative disc disease), lumbar   . Diabetes mellitus without complication ([facility])    TYPE 2  . Herpes simplex   . Hyperlipidemia   . Hypertension    CONTROLLED ON MEDS  . Vertigo    occasional, no episodes in 2-3 months  . Vertigo    OCCASIONALLY  . Vitamin B 12 deficiency     Past Surgical History:  Procedure Laterality Date  . [place] STUDY N/A 02/04/2016   Procedure: [place] STUDY;  Surgeon: Lucilla Lame, MD;  Location: [facility];  Service: Endoscopy;  Laterality: N/A;  . ABDOMINAL HYSTERECTOMY    . BACK SURGERY     Lumbar spinal fusion 05/30/2015  . CARDIAC CATHETERIZATION  2009  . CATARACT EXTRACTION Left   . CATARACT EXTRACTION W/PHACO Right 03/19/2015   Procedure: CATARACT EXTRACTION PHACO AND INTRAOCULAR LENS PLACEMENT ([place]);  Surgeon: Leandrew Koyanagi, MD;  Location: [facility];  Service: Ophthalmology;  Laterality: Right;  DIABETIC - insulin and oral meds  . CHOLECYSTECTOMY N/A 05/19/2016   Procedure: LAPAROSCOPIC CHOLECYSTECTOMY;  Surgeon: Clayburn Pert, MD;  Location: [facility] OR[facility];  Service: General;  Laterality: N/A;  . CORONARY STENT PLACEMENT  03/2008  . CYSTECTOMY Right    wrist  . ESOPHAGEAL MANOMETRY N/A 02/04/2016   Procedure: ESOPHAGEAL MANOMETRY (EM);  Surgeon: Lucilla Lame, MD;   Location: [facility];  Service: Endoscopy;  Laterality: N/A;  . ESOPHAGOGASTRODUODENOSCOPY (EGD) WITH PROPOFOL N/A 09/04/2015   Procedure: ESOPHAGOGASTRODUODENOSCOPY (EGD) WITH PROPOFOL;  Surgeon: Lucilla Lame, MD;  Location: [facility];  Service: Endoscopy;  Laterality: N/A;  INSULIN DEPENDENT DIABETIC  . EYE SURGERY Left    cataract extraction  . LUMBAR FUSION  2001, 2014, 2015  . [place]     2001, 2014, 2015  . TUMOR EXCISION     back of neck    Prior to Admission medications   Medication Sig Start Date End Date Taking? Authorizing Provider  acetaminophen-codeine (TYLENOL #3) 300-30 MG tablet Take 1-2 tablets by mouth every 6 (six) hours as needed for moderate pain or severe pain. 05/20/16  Yes Nicholes Mango, MD  acyclovir (ZOVIRAX) 400 MG tablet Take 400 mg by mouth as needed.  08/07/15  Yes Historical Provider, MD  aspirin EC 81 MG EC tablet Take 1 tablet (81 mg total) by mouth daily. 05/21/16  Yes Nicholes Mango, MD  atorvastatin (LIPITOR) 40 MG tablet Take 40 mg by mouth at bedtime.  08/16/14  Yes Historical Provider, MD  calcium-vitamin D (OSCAL WITH D) 500-200 MG-UNIT tablet Take 1 tablet by mouth daily with breakfast.   Yes Historical Provider, MD  clopidogrel (PLAVIX) 75 MG tablet Take 75 mg by mouth daily.   Yes Historical Provider, MD  diltiazem (  CARDIZEM CD) 120 MG 24 hr capsule Take 1 capsule by mouth daily. 05/04/16 05/04/17 Yes Historical Provider, MD  feeding supplement, GLUCERNA SHAKE, (GLUCERNA SHAKE) LIQD Take 237 mLs by mouth 2 (two) times daily between meals. 05/21/16  Yes Nicholes Mango, MD  ferrous sulfate 325 (65 FE) MG tablet Take 325 mg by mouth daily.   Yes Historical Provider, MD  glipiZIDE (GLUCOTROL) 10 MG tablet Take 10 mg by mouth 2 (two) times daily.   Yes Historical Provider, MD  insulin glargine (LANTUS) 100 UNIT/ML injection Inject 10 Units into the skin daily.   Yes Historical Provider, MD  insulin lispro (HUMALOG) 100 UNIT/ML injection Inject 4-10  Units into the skin 3 (three) times daily with meals. Based on sliding scale. Mid range.   Yes Historical Provider, MD  LORazepam (ATIVAN) 1 MG tablet Take 1 tablet (1 mg total) by mouth every 6 (six) hours as needed for anxiety. 05/09/16 05/09/17 Yes Anne-Caroline Mariea Clonts, MD  losartan (COZAAR) 100 MG tablet Take 100 mg by mouth every morning. AM 02/05/14  Yes Historical Provider, MD  ondansetron (ZOFRAN ODT) 8 MG disintegrating tablet Take 8 mg by mouth as needed for nausea or vomiting.   Yes Historical Provider, MD  pantoprazole (PROTONIX) 20 MG tablet Take 20 mg by mouth daily.   Yes Historical Provider, MD  prochlorperazine (COMPAZINE) 10 MG tablet Take 1 tablet (10 mg total) by mouth every 6 (six) hours as needed for nausea or vomiting. 05/14/16  Yes Diego F Pabon, MD  protein supplement shake (PREMIER PROTEIN) LIQD Take 325 mLs (11 oz total) by mouth daily. 05/21/16  Yes Nicholes Mango, MD  vitamin B-12 (CYANOCOBALAMIN) 1000 MCG tablet Take 1,000 mcg by mouth daily.   Yes Historical Provider, MD  promethazine (PHENERGAN) 12.5 MG tablet Take 1 tablet (12.5 mg total) by mouth every 6 (six) hours as needed for nausea or vomiting. 05/12/16   Schuyler Amor, MD    Allergies as of 06/22/2016 - Review Complete 06/17/2016  Allergen Reaction Noted  . Tramadol Swelling 03/11/2016  . Beta adrenergic blockers Hives 11/18/2014  . Celebrex [celecoxib] Other (See Comments) 11/18/2014  . Niacin Other (See Comments) 08/26/2015  . Niacin and related Other (See Comments) 11/18/2014  . Oxycodone Nausea Only 08/26/2015  . Shellfish allergy Nausea And Vomiting 11/18/2014  . Vicodin [hydrocodone-acetaminophen] Other (See Comments) 11/18/2014  . Heparin Rash 12/01/2015    Family History  Problem Relation Age of Onset  . Heart disease Father   . Diabetes Other   . Breast cancer Neg Hx     Social History   Social History  . Marital status: Married    Spouse name: N/A  . Number of children: N/A  . Years  of education: N/A   Occupational History  . Not on file.   Social History Main Topics  . Smoking status: Never Smoker  . Smokeless tobacco: Never Used  . Alcohol use 0.0 oz/week     Comment: 2-3 drinks per year  . Drug use: No  . Sexual activity: Not on file   Other Topics Concern  . Not on file   Social History Narrative  . No narrative on file    Review of Systems: See HPI, otherwise negative ROS  Physical Exam: BP 125/88   Pulse 86   Temp 98.1 F (36.7 C)   Resp 16   Ht 5' 2.5" (1.588 m)   Wt 178 lb (80.7 kg)   SpO2 100%   BMI 32.04 kg/m  General:   Alert,  pleasant and cooperative in NAD Head:  Normocephalic and atraumatic. Neck:  Supple; no masses or thyromegaly. Lungs:  Clear throughout to auscultation.    Heart:  Regular rate and rhythm. Abdomen:  Soft, nontender and nondistended. Normal bowel sounds, without guarding, and without rebound.   Neurologic:  Alert and  oriented x4;  grossly normal neurologically.  Impression/Plan: Dorothy Rogers is here for an endoscopy to be performed for gastroparesis  Risks, benefits, limitations, and alternatives regarding  endoscopy have been reviewed with the patient.  Questions have been answered.  All parties agreeable.   Dorothy Bellows, MD  06/29/2016, 9:12 AM

## 2016-06-29 NOTE — Anesthesia Preprocedure Evaluation (Signed)
Anesthesia Evaluation  Patient identified by MRN, date of birth, ID band Patient awake    Reviewed: Allergy & Precautions, H&P , NPO status , Patient's Chart, lab work & pertinent test results  History of Anesthesia Complications Negative for: history of anesthetic complications  Airway Mallampati: III  TM Distance: <3 FB Neck ROM: limited    Dental no notable dental hx. (+) Poor Dentition, Chipped   Pulmonary neg pulmonary ROS, neg shortness of breath,    Pulmonary exam normal breath sounds clear to auscultation       Cardiovascular Exercise Tolerance: Good hypertension, (-) angina+ CAD, + Cardiac Stents and + Peripheral Vascular Disease  (-) Past MI and (-) DOE Normal cardiovascular exam Rhythm:regular Rate:Normal     Neuro/Psych PSYCHIATRIC DISORDERS  Neuromuscular disease    GI/Hepatic Neg liver ROS, hiatal hernia, GERD  Controlled,  Endo/Other  diabetes, Type 2, Insulin Dependent  Renal/GU ESRFRenal disease     Musculoskeletal  (+) Arthritis ,   Abdominal   Peds  Hematology negative hematology ROS (+)   Anesthesia Other Findings Past Medical History: No date: Anemia No date: Anemia No date: Arthritis     Comment: lower back, hips No date: Blood transfusion without reported diagnosis FEB AND NOV 2009: CAD (coronary artery disease)     Comment: 6 STENTS No date: Cancer ([place])     Comment: tumor back of neck-fibroushistocytoma No date: Cataract No date: Chronic kidney disease     Comment: Stage III No date: Coronary artery disease No date: DDD (degenerative disc disease), lumbar No date: Diabetes mellitus without complication ([facility])     Comment: TYPE 2 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 No date: Hyperlipidemia No date: Hypertension     Comment: CONTROLLED ON MEDS No date: Vertigo     Comment: occasional, no episodes in 2-3 months No date: Vertigo     Comment: OCCASIONALLY No date: Vitamin B 12  deficiency  Past Surgical History: 02/04/2016: 24 HOUR PH STUDY N/A     Comment: Procedure: 24 HOUR PH STUDY;  Surgeon: Lucilla Lame, MD;  Location: [facility];  Service:               Endoscopy;  Laterality: N/A; No date: ABDOMINAL HYSTERECTOMY No date: BACK SURGERY     Comment: Lumbar spinal fusion 05/30/2015 2009: CARDIAC CATHETERIZATION No date: CATARACT EXTRACTION Left 03/19/2015: CATARACT EXTRACTION W/PHACO Right     Comment: Procedure: CATARACT EXTRACTION PHACO AND               INTRAOCULAR LENS PLACEMENT ([facility]);  Surgeon:               Leandrew Koyanagi, MD;  Location: [facility];  Service: Ophthalmology;                Laterality: Right;  DIABETIC - insulin and oral              meds 03/2008: CORONARY STENT PLACEMENT No date: CYSTECTOMY Right     Comment: wrist 02/04/2016: ESOPHAGEAL MANOMETRY N/A     Comment: Procedure: ESOPHAGEAL MANOMETRY (EM);                Surgeon: Lucilla Lame, MD;  Location: [facility]               ENDOSCOPY;  Service: Endoscopy;  Laterality:  N/A; 09/04/2015: ESOPHAGOGASTRODUODENOSCOPY (EGD) WITH PROPOFOL N/A     Comment: Procedure: ESOPHAGOGASTRODUODENOSCOPY (EGD)               WITH PROPOFOL;  Surgeon: Lucilla Lame, MD;                Location: [facility];  Service:               Endoscopy;  Laterality: N/A;  INSULIN DEPENDENT              DIABETIC No date: EYE SURGERY Left     Comment: cataract extraction 2001, 2014, 2015: LUMBAR FUSION No date: SPINE SURGERY     Comment: 2001, 2014, 2015 No date: TUMOR EXCISION     Comment: back of neck  BMI    Body Mass Index:  31.64 kg/m      Reproductive/Obstetrics negative OB ROS                             Anesthesia Physical  Anesthesia Plan  ASA: III  Anesthesia Plan: General   Post-op Pain Management:    Induction:   Airway Management Planned:   Additional Equipment:   Intra-op Plan:    Post-operative Plan:   Informed Consent: I have reviewed the patients History and Physical, chart, labs and discussed the procedure including the risks, benefits and alternatives for the proposed anesthesia with the patient or authorized representative who has indicated his/her understanding and acceptance.   Dental Advisory Given  Plan Discussed with: Anesthesiologist, CRNA and Surgeon  Anesthesia Plan Comments:         Anesthesia Quick Evaluation

## 2016-06-29 NOTE — Transfer of Care (Signed)
Immediate Anesthesia Transfer of Care Note  Patient: Dorothy Rogers  Procedure(s) Performed: Procedure(s): ESOPHAGOGASTRODUODENOSCOPY (EGD) WITH PROPOFOL (N/A)  Patient Location: Endoscopy Unit  Anesthesia Type:General  Level of Consciousness: sedated  Airway & Oxygen Therapy: Patient connected to nasal cannula oxygen  Post-op Assessment: Post -op Vital signs reviewed and stable  Post vital signs: stable  Last Vitals:  Vitals:   06/29/16 0935 06/29/16 0936  BP:  107/67  Pulse:  95  Resp:  11  Temp: (P) 36.5 C 36.5 C    Last Pain:  Vitals:   06/29/16 0936  TempSrc: Tympanic         Complications: No apparent anesthesia complications

## 2016-06-30 ENCOUNTER — Encounter: Payer: Self-pay | Admitting: Gastroenterology

## 2016-07-05 ENCOUNTER — Other Ambulatory Visit: Payer: Self-pay | Admitting: *Deleted

## 2016-07-13 ENCOUNTER — Encounter: Payer: Self-pay | Admitting: Gastroenterology

## 2016-07-13 ENCOUNTER — Ambulatory Visit (INDEPENDENT_AMBULATORY_CARE_PROVIDER_SITE_OTHER): Payer: Medicare Other | Admitting: Gastroenterology

## 2016-07-13 VITALS — BP 128/78 | HR 77 | Ht 63.0 in | Wt 182.0 lb

## 2016-07-13 DIAGNOSIS — E1143 Type 2 diabetes mellitus with diabetic autonomic (poly)neuropathy: Secondary | ICD-10-CM | POA: Diagnosis not present

## 2016-07-13 DIAGNOSIS — K5900 Constipation, unspecified: Secondary | ICD-10-CM | POA: Diagnosis not present

## 2016-07-13 DIAGNOSIS — K3184 Gastroparesis: Secondary | ICD-10-CM | POA: Diagnosis not present

## 2016-07-13 NOTE — Progress Notes (Signed)
Primary Care Physician: Idelle Crouch, MD  Primary Gastroenterologist:  Dr. Jonathon Bellows   Chief Complaint  Patient presents with  . Follow-up    HPI: Dorothy Rogers is a 66 y.o. female .  She is here today for a follow up . She was last seen on 06/02/16 for gastroparesis secondary to diabetes, constipation.   Summary of history   I was consulted to see her when she was recently admitted on 05/17/16 for nausea and vomiting . Barium swallow with tablet 05/13/16 shows mild narrowing of GE jn with passage of tablet , possible mild wall thickening .She has had prior ER visits for same issues and discharges when it was attributed to anxiety. She has had a lap paraesophageal hernia repair with fundoplication in [id number] .EGD in 08/2015 by Dr Allen Norris for nausea/vomiting showed medium sized hiatal hernia, antral gastritis that was negative for H pylori on biopsy.  At the time of her initial visit she was on Reglan ,had a positive HIDA scan , underwent a cholecystectomy. She did have elevated blood sugars ,some constpation.Gall bladder specimen showed chronic cholecystitis and cholelithiasis. Continued to have nausea after her cholecystectomy.Gastric emptying study showed only 27% of her stomach was emptied at 4 hour mark which is very abnormal and suggestive of gastroparesis   Interval history 05/2016-07/2016  Recent EGD showed a lot of retained food in the stomach but no outlet obstruction.  Gained 7 lbs since last week .   She has a bowel ,movement daily, she is satisfied with her bowel movements on PRN ,miralax. Not much nausea, none so far . Her sugars run about 150-160 . She is trying to keep it below 150 .  BP 128/78   Pulse 77   Ht 5\' 3"  (1.6 m)   Wt 182 lb (82.6 kg)   BMI 32.24 kg/m   Current Outpatient Prescriptions  Medication Sig Dispense Refill  . acyclovir (ZOVIRAX) 400 MG tablet Take 400 mg by mouth as needed.     Marland Kitchen aspirin EC 81 MG EC tablet Take 1 tablet (81 mg total) by  mouth daily.    Marland Kitchen atorvastatin (LIPITOR) 40 MG tablet Take 40 mg by mouth at bedtime.     . calcium-vitamin D (OSCAL WITH D) 500-200 MG-UNIT tablet Take 1 tablet by mouth daily with breakfast.    . clopidogrel (PLAVIX) 75 MG tablet Take 75 mg by mouth daily.    Marland Kitchen diltiazem (CARDIZEM CD) 120 MG 24 hr capsule Take 1 capsule by mouth daily.    . ferrous sulfate 325 (65 FE) MG tablet Take 325 mg by mouth daily.    Marland Kitchen glipiZIDE (GLUCOTROL) 10 MG tablet Take 10 mg by mouth 2 (two) times daily.    . insulin glargine (LANTUS) 100 UNIT/ML injection Inject 10 Units into the skin daily.    . insulin lispro (HUMALOG) 100 UNIT/ML injection Inject 4-10 Units into the skin 3 (three) times daily with meals. Based on sliding scale. Mid range.    Marland Kitchen LORazepam (ATIVAN) 1 MG tablet Take 1 tablet (1 mg total) by mouth every 6 (six) hours as needed for anxiety. 10 tablet 0  . losartan (COZAAR) 100 MG tablet Take 100 mg by mouth every morning. AM    . metoCLOPramide (REGLAN) 10 MG tablet Take by mouth.    . ondansetron (ZOFRAN ODT) 8 MG disintegrating tablet Take 8 mg by mouth as needed for nausea or vomiting.    . prochlorperazine (COMPAZINE) 10 MG tablet Take 1  tablet (10 mg total) by mouth every 6 (six) hours as needed for nausea or vomiting. 30 tablet 0  . promethazine (PHENERGAN) 12.5 MG tablet Take 1 tablet (12.5 mg total) by mouth every 6 (six) hours as needed for nausea or vomiting. 10 tablet 0  . sertraline (ZOLOFT) 50 MG tablet   0  . vitamin B-12 (CYANOCOBALAMIN) 1000 MCG tablet Take 1,000 mcg by mouth daily.     No current facility-administered medications for this visit.     Allergies as of 07/13/2016 - Review Complete 07/13/2016  Allergen Reaction Noted  . Tramadol Swelling 03/11/2016  . Beta adrenergic blockers Hives 11/18/2014  . Celebrex [celecoxib] Other (See Comments) 11/18/2014  . Niacin Other (See Comments) 08/26/2015  . Niacin and related Other (See Comments) 11/18/2014  . Oxycodone Nausea  Only 08/26/2015  . Shellfish allergy Nausea And Vomiting 11/18/2014  . Vicodin [hydrocodone-acetaminophen] Other (See Comments) 11/18/2014  . Heparin Rash 12/01/2015    ROS:  General: Negative for anorexia, weight loss, fever, chills, fatigue, weakness. ENT: Negative for hoarseness, difficulty swallowing , nasal congestion. CV: Negative for chest pain, angina, palpitations, dyspnea on exertion, peripheral edema.  Respiratory: Negative for dyspnea at rest, dyspnea on exertion, cough, sputum, wheezing.  GI: See history of present illness. GU:  Negative for dysuria, hematuria, urinary incontinence, urinary frequency, nocturnal urination.  Endo: Negative for unusual weight change.    Physical Examination:   BP 128/78   Pulse 77   Ht 5\' 3"  (1.6 m)   Wt 182 lb (82.6 kg)   BMI 32.24 kg/m   General: Well-nourished, well-developed in no acute distress.  Eyes: No icterus. Conjunctivae pink. Mouth: Oropharyngeal mucosa moist and pink , no lesions erythema or exudate. Lungs: Clear to auscultation bilaterally. Non-labored. Heart: Regular rate and rhythm, no murmurs rubs or gallops.  Abdomen: Bowel sounds are normal, nontender, nondistended, no hepatosplenomegaly or masses, no abdominal bruits or hernia , no rebound or guarding.   Extremities: No lower extremity edema. No clubbing or deformities. Neuro: Alert and oriented x 3.  Grossly intact. Skin: Warm and dry, no jaundice.   Psych: Alert and cooperative, normal mood and affect.   Imaging Studies: Ct Abdomen Pelvis Wo Contrast  Result Date: 06/17/2016 CLINICAL DATA:  Lower abdominal pain.  Nausea and vomiting. EXAM: CT ABDOMEN AND PELVIS WITHOUT CONTRAST TECHNIQUE: Multidetector CT imaging of the abdomen and pelvis was performed following the standard protocol without IV contrast. COMPARISON:  CT 05/05/2016. FINDINGS: Lower chest: 4 mm pulmonary nodule in the right lower lobe, image 5 series 34, not previously and it in the field of view  unchanged size of 5 mm right middle lobe pulmonary nodule. No consolidation or pleural fluid. Coronary artery calcifications versus stents. Calcified nodule versus calcified node at the right hilum. Hepatobiliary: No focal hepatic lesion allowing for lack contrast. Clips in the gallbladder fossa postcholecystectomy. No biliary dilatation. Pancreas: No ductal dilatation or inflammation. Spleen: Scattered calcified granuloma. Normal in size. Splenule noted inferiorly adjacent to the pancreatic tail. Adrenals/Urinary Tract: No adrenal nodule. Lobular renal contours and thinning of renal parenchyma. No hydronephrosis or perinephric edema. Calcification in the left kidney felt to be vascular in etiology. Urinary bladder is decompressed. Stomach/Bowel: Patulous distal esophagus containing minimal intraluminal contrast. Soft tissue density at the gastroesophageal junction may be sequela of prior Nissen. Stomach is distended with ingested contrast. Small duodenum diverticulum, no inflammation. No bowel obstruction with enteric contrast reaching the colon. There is mild fecalization of distal small bowel contents suggesting  slow transit. Moderate stool burden without colonic inflammation. Appendix not confidently identified, no pericecal inflammation. Vascular/Lymphatic: Abdominal atherosclerosis without aneurysm. No adenopathy. Reproductive: Status post hysterectomy. No adnexal masses. Other: No free air, free fluid, or intra-abdominal fluid collection. Postsurgical change of the anterior abdominal wall. Musculoskeletal: Posterior fusion hardware L2 through L4. Interbody spacer at L5-S1. There are no acute or suspicious osseous abnormalities. IMPRESSION: 1. No acute abnormality in the abdomen/pelvis. 2. Mild fecalization of distal small bowel contents suggesting slow transit. No evidence of obstruction or bowel inflammation. 3. Right lung base pulmonary nodules, likely a sequela of prior granulomatous disease. Middle lobe  nodule is unchanged from prior. Lower lobe nodule not previously included in the field of view. 4. Atherosclerosis.  Coronary artery calcifications versus stents. Electronically Signed   By: Jeb Levering M.D.   On: 06/17/2016 03:15    Assessment and Plan:   Dorothy Rogers is a 66 y.o. y/o female here for  follow up for diabetic gastroparesis and constipation.    1. Tight glycemic control , keep sugars <150 mg/dl , will check Hba1c. Doing well on Reglan, zofran and Ativan. Suggest to decrease her reglan to once every other day then stop in a few weeks. sibsequently she can go off the Ativan . At next follow up I will plan to gradually take her off the Zofran as her glycemic control improves. I also feel she is very anxious and that may have a significant role to her symptoms  2. Low fiber diet ,  Low fat meals. Long discussion on food choices  3. Constipation - miralax being used PRN and doing very well.   Dr Jonathon Bellows  MD F/u in 2 months

## 2016-07-14 ENCOUNTER — Ambulatory Visit: Payer: Self-pay | Admitting: Gastroenterology

## 2016-09-13 ENCOUNTER — Encounter: Payer: Self-pay | Admitting: Gastroenterology

## 2016-09-13 ENCOUNTER — Ambulatory Visit (INDEPENDENT_AMBULATORY_CARE_PROVIDER_SITE_OTHER): Payer: Medicare Other | Admitting: Gastroenterology

## 2016-09-13 VITALS — BP 121/74 | HR 82 | Temp 98.2°F | Resp 16 | Ht 63.0 in | Wt 184.2 lb

## 2016-09-13 DIAGNOSIS — K3184 Gastroparesis: Secondary | ICD-10-CM

## 2016-09-13 DIAGNOSIS — K5900 Constipation, unspecified: Secondary | ICD-10-CM

## 2016-09-13 NOTE — Progress Notes (Signed)
Primary Care Physician: Idelle Crouch, MD  Primary Gastroenterologist:  Dr. Jonathon Bellows    Summary of history   I was consulted to see her on  05/17/16 for nausea and vomiting when admitted . Barium swallow with tablet 05/13/16 shows mild narrowing of GE jn with passage of tablet , possible mild wall thickening .She has had prior ER visits for same issues and discharges when it was attributed to anxiety. She has had a lap paraesophageal hernia repair with fundoplication in 15/4008 .EGD in 08/2015 by Dr Allen Norris for nausea/vomiting showed medium sized hiatal hernia, antral gastritis that was negative for H pylori on biopsy.  At the time of her initial visit she was on Reglan ,had a positive HIDA scan , underwent a cholecystectomy. She did have elevated blood sugars ,some constpation.Gall bladder specimen showed chronic cholecystitis and cholelithiasis. Continued to have nausea after her cholecystectomy.Gastric emptying study showed only 27% of her stomach was emptied at 4 hour mark which is very abnormal and suggestive of gastroparesis   Interval history  07/2016-08/2016   Weight stable since last visit. She has stopped all her Reglan and Zofran. No vomiting since last visit. Sugars running between lower. Hba1c 7.5 .   Constipation- good and bad days - miralax causes diarrhea at times.    Chief Complaint  Patient presents with  . Follow-up    HPI: Dorothy Rogers is a 66 y.o. female  Current Outpatient Prescriptions  Medication Sig Dispense Refill  . acyclovir (ZOVIRAX) 400 MG tablet Take 400 mg by mouth as needed.     Marland Kitchen aspirin EC 81 MG EC tablet Take 1 tablet (81 mg total) by mouth daily.    Marland Kitchen atorvastatin (LIPITOR) 40 MG tablet Take 40 mg by mouth at bedtime.     . calcium-vitamin D (OSCAL WITH D) 500-200 MG-UNIT tablet Take 1 tablet by mouth daily with breakfast.    . clopidogrel (PLAVIX) 75 MG tablet Take 75 mg by mouth daily.    Marland Kitchen diltiazem (CARDIZEM CD) 120 MG 24 hr capsule  Take 1 capsule by mouth daily.    . ferrous sulfate 325 (65 FE) MG EC tablet Take by mouth.    . ferrous sulfate 325 (65 FE) MG tablet Take 325 mg by mouth daily.    Marland Kitchen glipiZIDE (GLUCOTROL) 10 MG tablet Take 10 mg by mouth 2 (two) times daily.    . insulin glargine (LANTUS) 100 UNIT/ML injection Inject 10 Units into the skin daily.    . insulin lispro (HUMALOG) 100 UNIT/ML injection Inject 4-10 Units into the skin 3 (three) times daily with meals. Based on sliding scale. Mid range.    Marland Kitchen LORazepam (ATIVAN) 1 MG tablet Take 1 tablet (1 mg total) by mouth every 6 (six) hours as needed for anxiety. 10 tablet 0  . SHINGRIX injection   0  . vitamin B-12 (CYANOCOBALAMIN) 1000 MCG tablet Take 1,000 mcg by mouth daily.    Marland Kitchen losartan (COZAAR) 100 MG tablet Take 100 mg by mouth every morning. AM    . metoCLOPramide (REGLAN) 10 MG tablet Take by mouth.    . ondansetron (ZOFRAN ODT) 8 MG disintegrating tablet Take 8 mg by mouth as needed for nausea or vomiting.    . prochlorperazine (COMPAZINE) 10 MG tablet Take 1 tablet (10 mg total) by mouth every 6 (six) hours as needed for nausea or vomiting. (Patient not taking: Reported on 09/13/2016) 30 tablet 0  . promethazine (PHENERGAN) 12.5 MG tablet Take 1 tablet (  12.5 mg total) by mouth every 6 (six) hours as needed for nausea or vomiting. (Patient not taking: Reported on 09/13/2016) 10 tablet 0  . sertraline (ZOLOFT) 50 MG tablet   0   No current facility-administered medications for this visit.     Allergies as of 09/13/2016 - Review Complete 09/13/2016  Allergen Reaction Noted  . Tramadol Swelling 03/11/2016  . Beta adrenergic blockers Hives 11/18/2014  . Celebrex [celecoxib] Other (See Comments) 11/18/2014  . Niacin Other (See Comments) 08/26/2015  . Niacin and related Other (See Comments) 11/18/2014  . Oxycodone Nausea Only 08/26/2015  . Shellfish allergy Nausea And Vomiting 11/18/2014  . Vicodin [hydrocodone-acetaminophen] Other (See Comments)  11/18/2014  . Heparin Rash 12/01/2015    ROS:  General: Negative for anorexia, weight loss, fever, chills, fatigue, weakness. ENT: Negative for hoarseness, difficulty swallowing , nasal congestion. CV: Negative for chest pain, angina, palpitations, dyspnea on exertion, peripheral edema.  Respiratory: Negative for dyspnea at rest, dyspnea on exertion, cough, sputum, wheezing.  GI: See history of present illness. GU:  Negative for dysuria, hematuria, urinary incontinence, urinary frequency, nocturnal urination.  Endo: Negative for unusual weight change.    Physical Examination:   BP 121/74   Pulse 82   Temp 98.2 F (36.8 C)   Resp 16   Ht 5\' 3"  (1.6 m)   Wt 184 lb 3.2 oz (83.6 kg)   BMI 32.63 kg/m   General: Well-nourished, well-developed in no acute distress.  Eyes: No icterus. Conjunctivae pink. Mouth: Oropharyngeal mucosa moist and pink , no lesions erythema or exudate. Lungs: Clear to auscultation bilaterally. Non-labored. Heart: Regular rate and rhythm, no murmurs rubs or gallops.  Abdomen: Bowel sounds are normal, nontender, nondistended, no hepatosplenomegaly or masses, no abdominal bruits or hernia , no rebound or guarding.   Extremities: No lower extremity edema. No clubbing or deformities. Neuro: Alert and oriented x 3.  Grossly intact. Skin: Warm and dry, no jaundice.   Psych: Alert and cooperative, normal mood and affect.  Imaging Studies: No results found.  Assessment and Plan:    Dorothy Kriegel Hensonis a 66 y.o.y/o femalehere for  follow up for diabetic gastroparesis and constipation.    1. Gastroparesis- doing well off all medications . Counseled on life style changes Reiterated tight glycemic control ,low fiber diet and low fat in food, weight loss  2. Low fiber diet ,  Low fat meals. Long discussion on food choices  3. Constipation - miralax being used PRN, try prune juice in addition.   Dr Jonathon Bellows  MD Follow up in 6 months

## 2016-11-26 DIAGNOSIS — E1143 Type 2 diabetes mellitus with diabetic autonomic (poly)neuropathy: Secondary | ICD-10-CM | POA: Insufficient documentation

## 2016-11-26 DIAGNOSIS — K3184 Gastroparesis: Secondary | ICD-10-CM

## 2016-11-29 DIAGNOSIS — M5431 Sciatica, right side: Secondary | ICD-10-CM | POA: Insufficient documentation

## 2016-11-29 DIAGNOSIS — M5432 Sciatica, left side: Secondary | ICD-10-CM

## 2017-01-07 ENCOUNTER — Other Ambulatory Visit: Payer: Self-pay | Admitting: Internal Medicine

## 2017-01-07 DIAGNOSIS — Z1231 Encounter for screening mammogram for malignant neoplasm of breast: Secondary | ICD-10-CM

## 2017-01-11 ENCOUNTER — Ambulatory Visit
Admission: RE | Admit: 2017-01-11 | Discharge: 2017-01-11 | Disposition: A | Discharge status: Home / Self Care | Payer: Medicare Other | Source: Ambulatory Visit | Attending: Internal Medicine | Admitting: Internal Medicine

## 2017-01-11 DIAGNOSIS — Z1231 Encounter for screening mammogram for malignant neoplasm of breast: Secondary | ICD-10-CM | POA: Diagnosis not present

## 2017-01-21 HISTORY — PX: POSTERIOR LUMBAR FUSION: SHX6036

## 2017-01-30 ENCOUNTER — Emergency Department
Admission: EM | Admit: 2017-01-30 | Discharge: 2017-01-30 | Disposition: A | Discharge status: Home / Self Care | Payer: Medicare Other | Attending: Emergency Medicine | Admitting: Emergency Medicine

## 2017-01-30 ENCOUNTER — Emergency Department: Payer: Medicare Other

## 2017-01-30 ENCOUNTER — Encounter: Payer: Self-pay | Admitting: Emergency Medicine

## 2017-01-30 DIAGNOSIS — K5900 Constipation, unspecified: Secondary | ICD-10-CM | POA: Diagnosis not present

## 2017-01-30 DIAGNOSIS — Z7902 Long term (current) use of antithrombotics/antiplatelets: Secondary | ICD-10-CM | POA: Diagnosis not present

## 2017-01-30 DIAGNOSIS — Z79899 Other long term (current) drug therapy: Secondary | ICD-10-CM | POA: Insufficient documentation

## 2017-01-30 DIAGNOSIS — I2510 Atherosclerotic heart disease of native coronary artery without angina pectoris: Secondary | ICD-10-CM | POA: Diagnosis not present

## 2017-01-30 DIAGNOSIS — Z7982 Long term (current) use of aspirin: Secondary | ICD-10-CM | POA: Diagnosis not present

## 2017-01-30 DIAGNOSIS — E119 Type 2 diabetes mellitus without complications: Secondary | ICD-10-CM | POA: Diagnosis not present

## 2017-01-30 DIAGNOSIS — R109 Unspecified abdominal pain: Secondary | ICD-10-CM | POA: Diagnosis not present

## 2017-01-30 DIAGNOSIS — Z794 Long term (current) use of insulin: Secondary | ICD-10-CM | POA: Insufficient documentation

## 2017-01-30 DIAGNOSIS — N183 Chronic kidney disease, stage 3 (moderate): Secondary | ICD-10-CM | POA: Diagnosis not present

## 2017-01-30 DIAGNOSIS — I129 Hypertensive chronic kidney disease with stage 1 through stage 4 chronic kidney disease, or unspecified chronic kidney disease: Secondary | ICD-10-CM | POA: Diagnosis not present

## 2017-01-30 LAB — COMPREHENSIVE METABOLIC PANEL
ALK PHOS: 91 U/L (ref 38–126)
ALT: 31 U/L (ref 14–54)
AST: 27 U/L (ref 15–41)
Albumin: 3.3 g/dL — ABNORMAL LOW (ref 3.5–5.0)
Anion gap: 9 (ref 5–15)
BUN: 30 mg/dL — ABNORMAL HIGH (ref 6–20)
CALCIUM: 8.7 mg/dL — AB (ref 8.9–10.3)
CO2: 21 mmol/L — AB (ref 22–32)
CREATININE: 1.64 mg/dL — AB (ref 0.44–1.00)
Chloride: 105 mmol/L (ref 101–111)
GFR, EST AFRICAN AMERICAN: 37 mL/min — AB (ref >60)
GFR, EST NON AFRICAN AMERICAN: 32 mL/min — AB (ref >60)
Glucose, Bld: 157 mg/dL — ABNORMAL HIGH (ref 65–99)
Potassium: 4.4 mmol/L (ref 3.5–5.1)
Sodium: 135 mmol/L (ref 135–145)
Total Bilirubin: 1 mg/dL (ref 0.3–1.2)
Total Protein: 7.1 g/dL (ref 6.5–8.1)

## 2017-01-30 LAB — URINALYSIS, COMPLETE (UACMP) WITH MICROSCOPIC
BACTERIA UA: NONE SEEN
BILIRUBIN URINE: NEGATIVE
GLUCOSE, UA: NEGATIVE mg/dL
Hgb urine dipstick: NEGATIVE
KETONES UR: NEGATIVE mg/dL
Nitrite: NEGATIVE
PROTEIN: NEGATIVE mg/dL
Specific Gravity, Urine: 1.01 (ref 1.005–1.030)
pH: 5 (ref 5.0–8.0)

## 2017-01-30 LAB — CBC
HCT: 32.7 % — ABNORMAL LOW (ref 35.0–47.0)
Hemoglobin: 11.6 g/dL — ABNORMAL LOW (ref 12.0–16.0)
MCH: 31.4 pg (ref 26.0–34.0)
MCHC: 35.3 g/dL (ref 32.0–36.0)
MCV: 88.9 fL (ref 80.0–100.0)
PLATELETS: 273 10*3/uL (ref 150–440)
RBC: 3.68 MIL/uL — AB (ref 3.80–5.20)
RDW: 13.3 % (ref 11.5–14.5)
WBC: 8.9 10*3/uL (ref 3.6–11.0)

## 2017-01-30 LAB — LIPASE, BLOOD: Lipase: 20 U/L (ref 11–51)

## 2017-01-30 MED ORDER — MAGNESIUM CITRATE PO SOLN
ORAL | Status: AC
  Filled 2017-01-30: qty 296

## 2017-01-30 MED ORDER — MAGNESIUM CITRATE PO SOLN
1.0000 | Freq: Once | ORAL | Status: AC
  Administered 2017-01-30: 1 via ORAL
  Filled 2017-01-30: qty 296

## 2017-01-30 NOTE — ED Notes (Signed)
S/p Enema attempt x2, moderate results.

## 2017-01-30 NOTE — ED Notes (Signed)
ED Provider at bedside. 

## 2017-01-30 NOTE — ED Triage Notes (Signed)
Had L1-L2 Back fusion surgery at [facility] on 8/24.  Arrives today with c/o no BM since surgery.  Has been given laxatives and enema at home.  Sent to ED for evaluation.

## 2017-01-30 NOTE — ED Provider Notes (Signed)
[facility] Emergency Department Provider Note  Time seen: 5:28 PM  I have reviewed the triage vital signs and the nursing notes.   HISTORY  Chief Complaint Abdominal Pain    HPI Dorothy Rogers is a 66 y.o. female With a past medical history of arthritis, CAD, diabetes, hypertension, hyperlipidemia who presents to the emergency department for abdominal discomfort and constipation. According to the patient approximately 9 days ago she had a lumbar spine fusion at [facility]. She has been taking Tylenol with codeine for her discomfort as well as daily stool softener and has restarted using MiraLAX. Patient states she has not had a decent bowel movement for the past 9 days and is now having abdominal discomfort over the past several days. She states the same thing happened after her last surgery when she became very constipated as well. Denies any dysuria or urinary frequency. Denies any fever. Denies any nausea or vomiting. the patient is still able to pass flatus. Describes her lower abdominal discomfort as mild to moderate aching type pain. Pain is somewhat better if she lies on her side.  Past Medical History:  Diagnosis Date  . Anemia   . Anemia   . Arthritis    lower back, hips  . Blood transfusion without reported diagnosis   . CAD (coronary artery disease) FEB AND NOV 2009   6 STENTS  . 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 Cataract   . Chronic kidney disease    Stage III  . Coronary artery disease   . DDD (degenerative disc disease), lumbar   . Diabetes mellitus without complication ([place])    TYPE 2  . Herpes simplex   . Hyperlipidemia   . Hypertension    CONTROLLED ON MEDS  . Vertigo    occasional, no episodes in 2-3 months  . Vertigo    OCCASIONALLY  . Vitamin B 12 deficiency     Patient Active Problem List   Diagnosis Date Noted  . Cholelithiasis   . Protein-calorie malnutrition, severe 05/18/2016  .  Supraventricular tachycardia ([place]) 05/05/2016  . SOB (shortness of breath)   . Near syncope 04/07/2016  . 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 Non-intractable cyclical vomiting with nausea   . Congenital esophageal defect   . Hiatal hernia   . Gastritis   . Diarrhea 07/10/2015  . Diabetes mellitus ([place]) 07/02/2015  . Adjustment disorder 06/16/2015  . Abdominal pain 06/16/2015  . C. difficile colitis 06/16/2015  . Intractable nausea and vomiting 06/09/2015  . Dehydration 06/09/2015  . Type 2 diabetes mellitus ([place]) 06/09/2015  . HLD (hyperlipidemia) 06/09/2015  . HTN (hypertension) 06/09/2015  . CAD (coronary artery disease) 06/09/2015  . CKD (chronic kidney disease), stage III 06/09/2015  . Thrombocytopenia ([place]) 05/30/2015  . Lumbar canal stenosis 05/30/2015  . Disc disease with myelopathy, lumbar 05/30/2015  . Presence of stent in coronary artery 05/30/2015  . 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 Status post lumbar spinal fusion 12/25/2014  . DDD (degenerative disc disease), lumbar 11/18/2014  . Lumbar post-laminectomy syndrome 11/18/2014  . 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 Piriformis syndrome of left side 11/18/2014  . Status post lumbar spine operation 05/09/2014  . Adiposity 04/23/2014  . Thoracic and lumbosacral neuritis 03/27/2013  . H/O arthrodesis 03/27/2013  . Peripheral vascular disease ([place]) 03/22/2013  . Hypomagnesemia 03/22/2013  . Absolute anemia 03/22/2013    Past Surgical History:  Procedure Laterality Date  . [place]  N/A 02/04/2016   Procedure: 48 HOUR PH STUDY;  Surgeon: Lucilla Lame, MD;  Location: [facility] ENDOSCOPY;  Service: Endoscopy;  Laterality: N/A;  . ABDOMINAL HYSTERECTOMY    . BACK SURGERY     Lumbar spinal fusion 05/30/2015  . CARDIAC CATHETERIZATION  2009  . CATARACT EXTRACTION Left   . CATARACT EXTRACTION W/PHACO Right 03/19/2015   Procedure: CATARACT EXTRACTION PHACO AND INTRAOCULAR LENS PLACEMENT ([place]);  Surgeon:  Leandrew Koyanagi, MD;  Location: [place];  Service: Ophthalmology;  Laterality: Right;  DIABETIC - insulin and oral meds  . CHOLECYSTECTOMY N/A 05/19/2016   Procedure: LAPAROSCOPIC CHOLECYSTECTOMY;  Surgeon: Clayburn Pert, MD;  Location: [facility] ORS;  Service: General;  Laterality: N/A;  . CORONARY STENT PLACEMENT  03/2008  . CYSTECTOMY Right    wrist  . ESOPHAGEAL MANOMETRY N/A 02/04/2016   Procedure: ESOPHAGEAL MANOMETRY (EM);  Surgeon: Lucilla Lame, MD;  Location: [facility] ENDOSCOPY;  Service: Endoscopy;  Laterality: N/A;  . ESOPHAGOGASTRODUODENOSCOPY (EGD) WITH PROPOFOL N/A 09/04/2015   Procedure: ESOPHAGOGASTRODUODENOSCOPY (EGD) WITH PROPOFOL;  Surgeon: Lucilla Lame, MD;  Location: [place];  Service: Endoscopy;  Laterality: N/A;  INSULIN DEPENDENT DIABETIC  . ESOPHAGOGASTRODUODENOSCOPY (EGD) WITH PROPOFOL N/A 06/29/2016   Procedure: ESOPHAGOGASTRODUODENOSCOPY (EGD) WITH PROPOFOL;  Surgeon: Jonathon Bellows, MD;  Location: [facility];  Service: Endoscopy;  Laterality: N/A;  . EYE SURGERY Left    cataract extraction  . LUMBAR FUSION  2001, 2014, 2015  . [place]     2001, 2014, 2015  . TUMOR EXCISION     back of neck    Prior to Admission medications   Medication Sig Start Date End Date Taking? Authorizing Provider  acyclovir (ZOVIRAX) 400 MG tablet Take 400 mg by mouth as needed.  08/07/15   [provider]  aspirin EC 81 MG EC tablet Take 1 tablet (81 mg total) by mouth daily. 05/21/16   Nicholes Mango, MD  atorvastatin (LIPITOR) 40 MG tablet Take 40 mg by mouth at bedtime.  08/16/14   [provider]  calcium-vitamin D (OSCAL WITH D) 500-200 MG-UNIT tablet Take 1 tablet by mouth daily with breakfast.    [provider]  clopidogrel (PLAVIX) 75 MG tablet Take 75 mg by mouth daily.    [provider]  diltiazem (CARDIZEM CD) 120 MG 24 hr capsule Take 1 capsule by mouth daily. 05/04/16 05/04/17  [provider]  ferrous sulfate  325 (65 FE) MG EC tablet Take by mouth.    [provider]  ferrous sulfate 325 (65 FE) MG tablet Take 325 mg by mouth daily.    [provider]  glipiZIDE (GLUCOTROL) 10 MG tablet Take 10 mg by mouth 2 (two) times daily.    [provider]  insulin glargine (LANTUS) 100 UNIT/ML injection Inject 10 Units into the skin daily.    [provider]  insulin lispro (HUMALOG) 100 UNIT/ML injection Inject 4-10 Units into the skin 3 (three) times daily with meals. Based on sliding scale. Mid range.    [provider]  LORazepam (ATIVAN) 1 MG tablet Take 1 tablet (1 mg total) by mouth every 6 (six) hours as needed for anxiety. 05/09/16 05/09/17  Eula Listen, MD  losartan (COZAAR) 100 MG tablet Take 100 mg by mouth every morning. AM 02/05/14   [provider]  metoCLOPramide (REGLAN) 10 MG tablet Take by mouth. 06/03/16 06/03/17  [provider]  ondansetron (ZOFRAN ODT) 8 MG disintegrating tablet Take 8 mg by mouth as needed for  nausea or vomiting.    [provider]  prochlorperazine (COMPAZINE) 10 MG tablet Take 1 tablet (10 mg total) by mouth every 6 (six) hours as needed for nausea or vomiting. Patient not taking: Reported on 09/13/2016 05/14/16   Jules Husbands, MD  promethazine (PHENERGAN) 12.5 MG tablet Take 1 tablet (12.5 mg total) by mouth every 6 (six) hours as needed for nausea or vomiting. Patient not taking: Reported on 09/13/2016 05/12/16   Schuyler Amor, MD  sertraline (ZOLOFT) 50 MG tablet  05/10/16   [provider]  [facility] injection  07/20/16   [provider]  vitamin B-12 (CYANOCOBALAMIN) 1000 MCG tablet Take 1,000 mcg by mouth daily.    [provider]    Allergies  Allergen Reactions  . Tramadol Swelling  . Beta Adrenergic Blockers Hives  . Celebrex [Celecoxib] Other (See Comments)    A lot of pressure in head  . Niacin Other (See Comments)    flushing  . Niacin And Related  Other (See Comments)    Flushing and hot sensation  . Oxycodone Nausea Only  . Shellfish Allergy Nausea And Vomiting  . Vicodin [Hydrocodone-Acetaminophen] Other (See Comments)    Hypoglycemic episodes  . Heparin Rash    Family History  Problem Relation Age of Onset  . Heart disease Father   . Diabetes Other   . Breast cancer Neg Hx     Social History Social History  Substance Use Topics  . Smoking status: Never Smoker  . Smokeless tobacco: Never Used  . Alcohol use 0.0 oz/week     Comment: 2-3 drinks per year    Review of Systems Constitutional: Negative for fever Cardiovascular: Negative for chest pain. Respiratory: Negative for shortness of breath. Gastrointestinal: lower abdominal discomfort. Negative for nausea or vomiting or diarrhea. Positive for constipation times one week. Genitourinary: Negative for dysuria. Musculoskeletal: mild lower back pain. Neurological: Negative for headache All other ROS negative  ____________________________________________   PHYSICAL EXAM:  VITAL SIGNS: ED Triage Vitals  Enc Vitals Group     BP 01/30/17 1711 126/82     Pulse Rate 01/30/17 1711 (!) 116     Resp 01/30/17 1711 18     Temp 01/30/17 1711 98.5 F (36.9 C)     Temp Source 01/30/17 1711 Oral     SpO2 01/30/17 1711 98 %     Weight 01/30/17 1710 183 lb (83 kg)     Height 01/30/17 1710 5\' 2"  (1.575 m)     Head Circumference --      Peak Flow --      Pain Score 01/30/17 1709 8     Pain Loc --      Pain Edu? --      Excl. in [place]? --     Constitutional: Alert and oriented. Well appearing and in no distress. Eyes: Normal exam ENT   Head: Normocephalic and atraumatic.   Mouth/Throat: Mucous membranes are moist. Cardiovascular: Normal rate, regular rhythm. No murmur Respiratory: Normal respiratory effort without tachypnea nor retractions. Breath sounds are clear  Gastrointestinal: soft, mild diffuse lower abdominal discomfort. No rebound or guarding. No  distention. Musculoskeletal: Nontender with normal range of motion in all extremities. Neurologic:  Normal speech and language. No gross focal neurologic deficits Skin:  Skin is warm, dry and intact.  Psychiatric: Mood and affect are normal.   ____________________________________________   RADIOLOGY  x-ray negative, besides moderate stool burden  ____________________________________________   INITIAL IMPRESSION / ASSESSMENT AND PLAN /  ED COURSE  Pertinent labs & imaging results that were available during my care of the patient were reviewed by me and considered in my medical decision making (see chart for details).  patient presents the emergency department for lower abdominal discomfort and constipation 9 days since her back surgery. Patient has been taking Colace daily and is now also taking MiraLAX but is still not been able to have a bowel movement. She states she had one bowel movement the day after her surgery but it was extremely small. We will check a 3-way abdominal x-ray to help rule out obstruction. If negative and labs are normal we will likely proceed with a large volume enema.  x-ray negative for obstructive pattern. Moderate stool [place]. Patient has received part of an enema with decent relief. Given magnesium citrate which made the patient feel somewhat nauseated and flushed, we will discontinue magnesium citrate. Patient states she is starting to feel a little better we will continue to monitor in the emergency department.  patient has had a very large bowel movement, feels much better. We'll discharge home with continued use of Colace and MiraLAX as needed.  ____________________________________________   FINAL CLINICAL IMPRESSION(S) / ED DIAGNOSES  lower abdominal pain constipation    Harvest Dark, MD 01/30/17 2129

## 2017-01-30 NOTE — ED Notes (Signed)
Large amount of stool produced. Pt c/o abd cramping. Laying in bed with husband at bedside.

## 2017-02-03 ENCOUNTER — Encounter: Payer: Self-pay | Admitting: Emergency Medicine

## 2017-02-03 ENCOUNTER — Emergency Department
Admission: EM | Admit: 2017-02-03 | Discharge: 2017-02-03 | Disposition: A | Discharge status: Home / Self Care | Payer: Medicare Other | Attending: Student in an Organized Health Care Education/Training Program | Admitting: Student in an Organized Health Care Education/Training Program

## 2017-02-03 ENCOUNTER — Emergency Department: Payer: Medicare Other

## 2017-02-03 DIAGNOSIS — Z794 Long term (current) use of insulin: Secondary | ICD-10-CM | POA: Diagnosis not present

## 2017-02-03 DIAGNOSIS — Z7982 Long term (current) use of aspirin: Secondary | ICD-10-CM | POA: Diagnosis not present

## 2017-02-03 DIAGNOSIS — K5903 Drug induced constipation: Secondary | ICD-10-CM | POA: Diagnosis not present

## 2017-02-03 DIAGNOSIS — Z79899 Other long term (current) drug therapy: Secondary | ICD-10-CM | POA: Diagnosis not present

## 2017-02-03 DIAGNOSIS — Z7902 Long term (current) use of antithrombotics/antiplatelets: Secondary | ICD-10-CM | POA: Diagnosis not present

## 2017-02-03 DIAGNOSIS — E1122 Type 2 diabetes mellitus with diabetic chronic kidney disease: Secondary | ICD-10-CM | POA: Diagnosis not present

## 2017-02-03 DIAGNOSIS — I129 Hypertensive chronic kidney disease with stage 1 through stage 4 chronic kidney disease, or unspecified chronic kidney disease: Secondary | ICD-10-CM | POA: Diagnosis not present

## 2017-02-03 DIAGNOSIS — I2510 Atherosclerotic heart disease of native coronary artery without angina pectoris: Secondary | ICD-10-CM | POA: Insufficient documentation

## 2017-02-03 DIAGNOSIS — R1032 Left lower quadrant pain: Secondary | ICD-10-CM | POA: Diagnosis not present

## 2017-02-03 DIAGNOSIS — N183 Chronic kidney disease, stage 3 (moderate): Secondary | ICD-10-CM | POA: Insufficient documentation

## 2017-02-03 DIAGNOSIS — K5900 Constipation, unspecified: Secondary | ICD-10-CM | POA: Diagnosis present

## 2017-02-03 LAB — CBC
HEMATOCRIT: 32.7 % — AB (ref 35.0–47.0)
HEMOGLOBIN: 11.4 g/dL — AB (ref 12.0–16.0)
MCH: 30.6 pg (ref 26.0–34.0)
MCHC: 34.8 g/dL (ref 32.0–36.0)
MCV: 88 fL (ref 80.0–100.0)
PLATELETS: 328 10*3/uL (ref 150–440)
RBC: 3.72 MIL/uL — AB (ref 3.80–5.20)
RDW: 13.8 % (ref 11.5–14.5)
WBC: 10.6 10*3/uL (ref 3.6–11.0)

## 2017-02-03 LAB — COMPREHENSIVE METABOLIC PANEL
ALT: 21 U/L (ref 14–54)
ANION GAP: 9 (ref 5–15)
AST: 21 U/L (ref 15–41)
Albumin: 3.6 g/dL (ref 3.5–5.0)
Alkaline Phosphatase: 107 U/L (ref 38–126)
BUN: 30 mg/dL — ABNORMAL HIGH (ref 6–20)
CO2: 23 mmol/L (ref 22–32)
Calcium: 9 mg/dL (ref 8.9–10.3)
Chloride: 105 mmol/L (ref 101–111)
Creatinine, Ser: 1.62 mg/dL — ABNORMAL HIGH (ref 0.44–1.00)
GFR, EST AFRICAN AMERICAN: 37 mL/min — AB (ref >60)
GFR, EST NON AFRICAN AMERICAN: 32 mL/min — AB (ref >60)
Glucose, Bld: 180 mg/dL — ABNORMAL HIGH (ref 65–99)
POTASSIUM: 4.8 mmol/L (ref 3.5–5.1)
SODIUM: 137 mmol/L (ref 135–145)
Total Bilirubin: 1 mg/dL (ref 0.3–1.2)
Total Protein: 7.7 g/dL (ref 6.5–8.1)

## 2017-02-03 LAB — GLUCOSE, CAPILLARY: Glucose-Capillary: 115 mg/dL — ABNORMAL HIGH (ref 65–99)

## 2017-02-03 LAB — LIPASE, BLOOD: LIPASE: 39 U/L (ref 11–51)

## 2017-02-03 MED ORDER — MAGNESIUM CITRATE PO SOLN
1.0000 | Freq: Once | ORAL | Status: DC
  Filled 2017-02-03: qty 296

## 2017-02-03 MED ORDER — PROMETHAZINE HCL 25 MG/ML IJ SOLN: 12.5000 mg | Freq: Four times a day (QID) | INTRAMUSCULAR | Status: DC | PRN

## 2017-02-03 MED ORDER — DOCUSATE SODIUM 100 MG PO CAPS
100.0000 mg | ORAL_CAPSULE | Freq: Once | ORAL | Status: AC
  Administered 2017-02-03: 100 mg via ORAL
  Filled 2017-02-03: qty 1

## 2017-02-03 MED ORDER — IOPAMIDOL (ISOVUE-300) INJECTION 61%
75.0000 mL | Freq: Once | INTRAVENOUS | Status: AC | PRN
  Administered 2017-02-03: 75 mL via INTRAVENOUS

## 2017-02-03 MED ORDER — DICYCLOMINE HCL 10 MG PO CAPS
10.0000 mg | ORAL_CAPSULE | Freq: Once | ORAL | Status: AC
  Administered 2017-02-03: 10 mg via ORAL

## 2017-02-03 MED ORDER — LACTULOSE 20 G PO PACK: 20.0000 g | PACK | Freq: Two times a day (BID) | ORAL | 0 refills | Status: DC

## 2017-02-03 MED ORDER — ACETAMINOPHEN 500 MG PO TABS
ORAL_TABLET | ORAL | Status: AC
  Filled 2017-02-03: qty 2

## 2017-02-03 MED ORDER — SODIUM CHLORIDE 0.9 % IV BOLUS (SEPSIS)
500.0000 mL | Freq: Once | INTRAVENOUS | Status: AC
  Administered 2017-02-03: 500 mL via INTRAVENOUS

## 2017-02-03 MED ORDER — METOCLOPRAMIDE HCL 5 MG/ML IJ SOLN
10.0000 mg | Freq: Once | INTRAMUSCULAR | Status: DC
  Filled 2017-02-03: qty 2

## 2017-02-03 MED ORDER — ACETAMINOPHEN 500 MG PO TABS
1000.0000 mg | ORAL_TABLET | Freq: Once | ORAL | Status: AC
  Administered 2017-02-03: 1000 mg via ORAL

## 2017-02-03 MED ORDER — LACTULOSE 10 GM/15ML PO SOLN
30.0000 g | Freq: Once | ORAL | Status: AC
  Administered 2017-02-03: 30 g via ORAL
  Filled 2017-02-03: qty 60

## 2017-02-03 MED ORDER — LIDOCAINE HCL 2 % EX GEL
CUTANEOUS | Status: AC
  Administered 2017-02-03: 1 via TOPICAL
  Filled 2017-02-03: qty 10

## 2017-02-03 MED ORDER — LIDOCAINE HCL 2 % EX GEL
1.0000 "application " | Freq: Once | CUTANEOUS | Status: AC
  Administered 2017-02-03: 1 via TOPICAL
  Filled 2017-02-03: qty 5

## 2017-02-03 MED ORDER — DICYCLOMINE HCL 10 MG PO CAPS
ORAL_CAPSULE | ORAL | Status: AC
  Administered 2017-02-03: 10 mg via ORAL
  Filled 2017-02-03: qty 1

## 2017-02-03 NOTE — ED Notes (Signed)
Enema given successfully. Pt could not hold bowels and needed to be helped to the bedside commode immediatly after fluids finished. PT had a BM immediately and denies relief. RN instructed pt to sit on toilet to see if more stool will pass.

## 2017-02-03 NOTE — ED Notes (Signed)
Pt had back surgery recently. Staples remain in pts back. Incision appears to be healing appropriately. No signs of infection noted, pt denies increased tenderness in back over the past 4 days.

## 2017-02-03 NOTE — ED Notes (Signed)
Pt reports small amount of improvement in pain. Pt reports feeling comfortable going home to wait for the rest of the stool to pass. Pt taken to car via wheelchair by husband. Family to help pt into car.

## 2017-02-03 NOTE — ED Triage Notes (Signed)
Patient presents to ED with constipation and abdominal pain. Patient reports lower generalized aching abdominal pain. Patient came to ED on 01/30/17 for enema and had three bowel movements throughout the day. Patient has not had a bowel movement since 01/29/17. Patient saw "back surgeon" today and had stitches removed who said "she needed to come on up here." Patient's husband states "pain medicine from surgery" is what caused constipation. Patient was taking tylenol at home to relieve the pain.

## 2017-02-03 NOTE — ED Notes (Signed)
Family talked to nurse about concerns that pts blood glucose is low. Family reports pt took her Lantus at pm yesterday but did not eat or drink since then. Family reports last time she checked her sugar was in the 90s.

## 2017-02-03 NOTE — Discharge Instructions (Signed)

## 2017-02-03 NOTE — ED Notes (Signed)
Patient transported to CT 

## 2017-02-03 NOTE — ED Notes (Signed)
Pt refused Reglan and reported to RN that it has caused "anxiety" in the past.

## 2017-02-03 NOTE — ED Provider Notes (Signed)
[facility] Emergency Department Provider Note    None    (approximate)  I have reviewed the triage vital signs and the nursing notes.   HISTORY  Chief Complaint Constipation and Abdominal Pain    HPI Dorothy Rogers is a 66 y.o. female since the chief complaint of constipation and diffuse abdominal pain. Patient status post recent spinal surgery. Has had issues with constipation and postop.Marland Kitchen She's been taking MiraLAX at home without any improvement. Did have admission where she had enema performed with improvement. Is on narcotic pain medication after surgery. Denies any fevers. No nausea or vomiting.   Past Medical History:  Diagnosis Date  . Anemia   . Anemia   . Arthritis    lower back, hips  . Blood transfusion without reported diagnosis   . CAD (coronary artery disease) FEB AND NOV 2009   6 STENTS  . 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 Cataract   . Chronic kidney disease    Stage III  . Coronary artery disease   . DDD (degenerative disc disease), lumbar   . Diabetes mellitus without complication ([place])    TYPE 2  . Herpes simplex   . Hyperlipidemia   . Hypertension    CONTROLLED ON MEDS  . Vertigo    occasional, no episodes in 2-3 months  . Vertigo    OCCASIONALLY  . Vitamin B 12 deficiency    Family History  Problem Relation Age of Onset  . Heart disease Father   . Diabetes Other   . Breast cancer Neg Hx    Past Surgical History:  Procedure Laterality Date  . [place] STUDY N/A 02/04/2016   Procedure: [place] STUDY;  Surgeon: Lucilla Lame, MD;  Location: [facility] ENDOSCOPY;  Service: Endoscopy;  Laterality: N/A;  . ABDOMINAL HYSTERECTOMY    . BACK SURGERY     Lumbar spinal fusion 05/30/2015  . CARDIAC CATHETERIZATION  2009  . CATARACT EXTRACTION Left   . CATARACT EXTRACTION W/PHACO Right 03/19/2015   Procedure: CATARACT EXTRACTION PHACO AND INTRAOCULAR LENS PLACEMENT ([place]);  Surgeon: Leandrew Koyanagi, MD;  Location: [place];  Service: Ophthalmology;  Laterality: Right;  DIABETIC - insulin and oral meds  . CHOLECYSTECTOMY N/A 05/19/2016   Procedure: LAPAROSCOPIC CHOLECYSTECTOMY;  Surgeon: Clayburn Pert, MD;  Location: [facility] ORS;  Service: General;  Laterality: N/A;  . CORONARY STENT PLACEMENT  03/2008  . CYSTECTOMY Right    wrist  . ESOPHAGEAL MANOMETRY N/A 02/04/2016   Procedure: ESOPHAGEAL MANOMETRY (EM);  Surgeon: Lucilla Lame, MD;  Location: [facility] ENDOSCOPY;  Service: Endoscopy;  Laterality: N/A;  . ESOPHAGOGASTRODUODENOSCOPY (EGD) WITH PROPOFOL N/A 09/04/2015   Procedure: ESOPHAGOGASTRODUODENOSCOPY (EGD) WITH PROPOFOL;  Surgeon: Lucilla Lame, MD;  Location: [place];  Service: Endoscopy;  Laterality: N/A;  INSULIN DEPENDENT DIABETIC  . ESOPHAGOGASTRODUODENOSCOPY (EGD) WITH PROPOFOL N/A 06/29/2016   Procedure: ESOPHAGOGASTRODUODENOSCOPY (EGD) WITH PROPOFOL;  Surgeon: Jonathon Bellows, MD;  Location: [facility] ENDOSCOPY;  Service: Endoscopy;  Laterality: N/A;  . EYE SURGERY Left    cataract extraction  . LUMBAR FUSION  2001, 2014, 2015  . [place]     2001, 2014, 2015  . TUMOR EXCISION     back of neck   Patient Active Problem List   Diagnosis Date Noted  . Cholelithiasis   . Protein-calorie malnutrition, severe 05/18/2016  . Supraventricular tachycardia ([place]) 05/05/2016  . SOB (shortness of breath)   . Near syncope 04/07/2016  .  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 Non-intractable cyclical vomiting with nausea   . Congenital esophageal defect   . Hiatal hernia   . Gastritis   . Diarrhea 07/10/2015  . Diabetes mellitus ([place]) 07/02/2015  . Adjustment disorder 06/16/2015  . Abdominal pain 06/16/2015  . C. difficile colitis 06/16/2015  . Intractable nausea and vomiting 06/09/2015  . Dehydration 06/09/2015  . Type 2 diabetes mellitus ([place]) 06/09/2015  . HLD (hyperlipidemia) 06/09/2015  . HTN (hypertension) 06/09/2015  . CAD (coronary artery disease)  06/09/2015  . CKD (chronic kidney disease), stage III 06/09/2015  . Thrombocytopenia ([place]) 05/30/2015  . Lumbar canal stenosis 05/30/2015  . Disc disease with myelopathy, lumbar 05/30/2015  . Presence of stent in coronary artery 05/30/2015  . 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 Status post lumbar spinal fusion 12/25/2014  . DDD (degenerative disc disease), lumbar 11/18/2014  . Lumbar post-laminectomy syndrome 11/18/2014  . 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 Piriformis syndrome of left side 11/18/2014  . Status post lumbar spine operation 05/09/2014  . Adiposity 04/23/2014  . Thoracic and lumbosacral neuritis 03/27/2013  . H/O arthrodesis 03/27/2013  . Peripheral vascular disease ([place]) 03/22/2013  . Hypomagnesemia 03/22/2013  . Absolute anemia 03/22/2013      Prior to Admission medications   Medication Sig Start Date End Date Taking? Authorizing Provider  acyclovir (ZOVIRAX) 400 MG tablet Take 400 mg by mouth as needed.  08/07/15   [provider]  aspirin EC 81 MG EC tablet Take 1 tablet (81 mg total) by mouth daily. 05/21/16   Nicholes Mango, MD  atorvastatin (LIPITOR) 40 MG tablet Take 40 mg by mouth at bedtime.  08/16/14   [provider]  calcium-vitamin D (OSCAL WITH D) 500-200 MG-UNIT tablet Take 1 tablet by mouth daily with breakfast.    [provider]  clopidogrel (PLAVIX) 75 MG tablet Take 75 mg by mouth daily.    [provider]  diltiazem (CARDIZEM CD) 120 MG 24 hr capsule Take 1 capsule by mouth daily. 05/04/16 05/04/17  [provider]  ferrous sulfate 325 (65 FE) MG EC tablet Take by mouth.    [provider]  ferrous sulfate 325 (65 FE) MG tablet Take 325 mg by mouth daily.    [provider]  glipiZIDE (GLUCOTROL) 10 MG tablet Take 10 mg by mouth 2 (two) times daily.    [provider]  insulin glargine (LANTUS) 100 UNIT/ML injection Inject 10 Units into the skin daily.    [provider]  insulin lispro (HUMALOG) 100 UNIT/ML injection Inject 4-10 Units into the skin 3 (three) times daily with meals. Based on sliding scale. Mid range.    [provider]  LORazepam (ATIVAN) 1 MG tablet Take 1 tablet (1 mg total) by mouth every 6 (six) hours as needed for anxiety. 05/09/16 05/09/17  Eula Listen, MD  losartan (COZAAR) 100 MG tablet Take 100 mg by mouth every morning. AM 02/05/14   [provider]  metoCLOPramide (REGLAN) 10 MG tablet Take by mouth. 06/03/16 06/03/17  [provider]  ondansetron (ZOFRAN ODT) 8 MG disintegrating tablet Take 8 mg by mouth as needed for nausea or vomiting.    [provider]  prochlorperazine (COMPAZINE) 10 MG tablet Take 1 tablet (10 mg total) by mouth every 6 (six) hours as needed for nausea or vomiting. Patient not taking: Reported on 09/13/2016 05/14/16   Caroleen Hamman F, MD  promethazine (PHENERGAN) 12.5 MG tablet Take 1 tablet (12.5 mg  total) by mouth every 6 (six) hours as needed for nausea or vomiting. Patient not taking: Reported on 09/13/2016 05/12/16   Schuyler Amor, MD  sertraline (ZOLOFT) 50 MG tablet  05/10/16   [provider]  [facility] injection  07/20/16   [provider]  vitamin B-12 (CYANOCOBALAMIN) 1000 MCG tablet Take 1,000 mcg by mouth daily.    [provider]    Allergies Tramadol; Beta adrenergic blockers; Celebrex [celecoxib]; Niacin; Niacin and related; Oxycodone; Shellfish allergy; Vicodin [hydrocodone-acetaminophen]; and Heparin    Social History Social History  Substance Use Topics  . Smoking status: Never Smoker  . Smokeless tobacco: Never Used  . Alcohol use 0.0 oz/week     Comment: 2-3 drinks per year    Review of Systems Patient denies headaches, rhinorrhea, blurry vision, numbness, shortness of breath, chest pain, edema, cough, abdominal pain, nausea, vomiting, diarrhea, dysuria, fevers, rashes or hallucinations unless otherwise  stated above in HPI. ____________________________________________   PHYSICAL EXAM:  VITAL SIGNS: Vitals:   02/03/17 1447 02/03/17 1448  BP:  138/73  Pulse:  (!) 113  Resp:  16  Temp:  98.4 F (36.9 C)  SpO2: 98% 98%    Constitutional: Alert and oriented.  in no acute distress. Eyes: Conjunctivae are normal.  Head: Atraumatic. Nose: No congestion/rhinnorhea. Mouth/Throat: Mucous membranes are moist.   Neck: No stridor. Painless ROM.  Cardiovascular: Normal rate, regular rhythm. Grossly normal heart sounds.  Good peripheral circulation. Respiratory: Normal respiratory effort.  No retractions. Lungs CTAB. Gastrointestinal: Soft and nontender. No distention. No abdominal bruits. No CVA tenderness. Genitourinary: rectal exam with no formed stool in the rectal vault Musculoskeletal: No lower extremity tenderness nor edema.  No joint effusions. Neurologic:  Normal speech and language. No gross focal neurologic deficits are appreciated. No facial droop Skin:  Skin is warm, dry and intact. No rash noted.  Surgical incision appears c/d/i Psychiatric: Mood and affect are normal. Speech and behavior are normal.  ____________________________________________   LABS (all labs ordered are listed, but only abnormal results are displayed)  Results for orders placed or performed during the hospital encounter of 02/03/17 (from the past 24 hour(s))  Lipase, blood     Status: None   Collection Time: 02/03/17  2:54 PM  Result Value Ref Range   Lipase 39 11 - 51 U/L  Comprehensive metabolic panel     Status: Abnormal   Collection Time: 02/03/17  2:54 PM  Result Value Ref Range   Sodium 137 135 - 145 mmol/L   Potassium 4.8 3.5 - 5.1 mmol/L   Chloride 105 101 - 111 mmol/L   CO2 23 22 - 32 mmol/L   Glucose, Bld 180 (H) 65 - 99 mg/dL   BUN 30 (H) 6 - 20 mg/dL   Creatinine, Ser 1.62 (H) 0.44 - 1.00 mg/dL   Calcium 9.0 8.9 - 10.3 mg/dL   Total Protein 7.7 6.5 - 8.1 g/dL   Albumin 3.6 3.5 -  5.0 g/dL   AST 21 15 - 41 U/L   ALT 21 14 - 54 U/L   Alkaline Phosphatase 107 38 - 126 U/L   Total Bilirubin 1.0 0.3 - 1.2 mg/dL   GFR calc non Af Amer 32 (L) >60 mL/min   GFR calc Af Amer 37 (L) >60 mL/min   Anion gap 9 5 - 15  CBC     Status: Abnormal   Collection Time: 02/03/17  2:54 PM  Result Value Ref Range   WBC 10.6 3.6 -  11.0 K/uL   RBC 3.72 (L) 3.80 - 5.20 MIL/uL   Hemoglobin 11.4 (L) 12.0 - 16.0 g/dL   HCT 32.7 (L) 35.0 - 47.0 %   MCV 88.0 80.0 - 100.0 fL   MCH 30.6 26.0 - 34.0 pg   MCHC 34.8 32.0 - 36.0 g/dL   RDW 13.8 11.5 - 14.5 %   Platelets 328 150 - 440 K/uL   ____________________________________________ ____________________________________________  RADIOLOGY  I personally reviewed all radiographic images ordered to evaluate for the above acute complaints and reviewed radiology reports and findings.  These findings were personally discussed with the patient.  Please see medical record for radiology report.  ____________________________________________   PROCEDURES  Procedure(s) performed:  Procedures    Critical Care performed: no ____________________________________________   INITIAL IMPRESSION / ASSESSMENT AND PLAN / ED COURSE  Pertinent labs & imaging results that were available during my care of the patient were reviewed by me and considered in my medical decision making (see chart for details).  DDX: impaction, constipation, diverticulitis, sbo, colitis, uti, abscess  Dorothy Rogers is a 66 y.o. who presents to the ED with abdominal pain as described above with recent issues with constipation. Patient is afebrile with mild tachycardia but she is in discomfort at this time. We'll give bolus fluids. Physical exam shows no evidence of fecal impaction.  Will order CT scan to evaluate for any evidence of acute intra-abdominal process.  Clinical Course as of Feb 04 1852  Thu Feb 03, 2017  1834 CT scan shows no evidence of perforation or bowel  obstruction but there is large volume stool. Will order enema and give dose of lactulose.  [PR]    Clinical Course User Index [PR] Merlyn Lot, MD   ----------------------------------------- 8:01 PM on 02/03/2017 -----------------------------------------   patient has moved her bowels. She is tolerating oral hydration. Patient was some improvement after enema. Will start the patient on lactulose. At this point she is stable for further workup as an outpatient.  ____________________________________________   FINAL CLINICAL IMPRESSION(S) / ED DIAGNOSES  Final diagnoses:  Drug-induced constipation  Left lower quadrant pain      NEW MEDICATIONS STARTED DURING THIS VISIT:  New Prescriptions   No medications on file     Note:  This document was prepared using Dragon voice recognition software and may include unintentional dictation errors.    Merlyn Lot, MD 02/03/17 2001

## 2017-02-24 ENCOUNTER — Encounter: Payer: Self-pay | Admitting: *Deleted

## 2017-02-24 ENCOUNTER — Emergency Department: Payer: Medicare Other

## 2017-02-24 ENCOUNTER — Emergency Department
Admission: EM | Admit: 2017-02-24 | Discharge: 2017-02-24 | Disposition: A | Discharge status: Home / Self Care | Payer: Medicare Other | Attending: Emergency Medicine | Admitting: Emergency Medicine

## 2017-02-24 DIAGNOSIS — R42 Dizziness and giddiness: Secondary | ICD-10-CM | POA: Diagnosis not present

## 2017-02-24 DIAGNOSIS — Z79899 Other long term (current) drug therapy: Secondary | ICD-10-CM | POA: Insufficient documentation

## 2017-02-24 DIAGNOSIS — N183 Chronic kidney disease, stage 3 (moderate): Secondary | ICD-10-CM | POA: Insufficient documentation

## 2017-02-24 DIAGNOSIS — Z7902 Long term (current) use of antithrombotics/antiplatelets: Secondary | ICD-10-CM | POA: Insufficient documentation

## 2017-02-24 DIAGNOSIS — I129 Hypertensive chronic kidney disease with stage 1 through stage 4 chronic kidney disease, or unspecified chronic kidney disease: Secondary | ICD-10-CM | POA: Insufficient documentation

## 2017-02-24 DIAGNOSIS — I2510 Atherosclerotic heart disease of native coronary artery without angina pectoris: Secondary | ICD-10-CM | POA: Diagnosis not present

## 2017-02-24 DIAGNOSIS — Z85831 Personal history of malignant neoplasm of soft tissue: Secondary | ICD-10-CM | POA: Diagnosis not present

## 2017-02-24 DIAGNOSIS — T426X5A Adverse effect of other antiepileptic and sedative-hypnotic drugs, initial encounter: Secondary | ICD-10-CM | POA: Diagnosis not present

## 2017-02-24 DIAGNOSIS — Z955 Presence of coronary angioplasty implant and graft: Secondary | ICD-10-CM | POA: Insufficient documentation

## 2017-02-24 DIAGNOSIS — Z7982 Long term (current) use of aspirin: Secondary | ICD-10-CM | POA: Diagnosis not present

## 2017-02-24 DIAGNOSIS — Z794 Long term (current) use of insulin: Secondary | ICD-10-CM | POA: Diagnosis not present

## 2017-02-24 DIAGNOSIS — E119 Type 2 diabetes mellitus without complications: Secondary | ICD-10-CM | POA: Insufficient documentation

## 2017-02-24 LAB — CBC WITH DIFFERENTIAL/PLATELET
Basophils Absolute: 0 10*3/uL (ref 0–0.1)
Basophils Relative: 1 %
Eosinophils Absolute: 0.1 10*3/uL (ref 0–0.7)
Eosinophils Relative: 1 %
HEMATOCRIT: 31.3 % — AB (ref 35.0–47.0)
HEMOGLOBIN: 10.6 g/dL — AB (ref 12.0–16.0)
LYMPHS ABS: 1.4 10*3/uL (ref 1.0–3.6)
Lymphocytes Relative: 21 %
MCH: 30.7 pg (ref 26.0–34.0)
MCHC: 33.9 g/dL (ref 32.0–36.0)
MCV: 90.5 fL (ref 80.0–100.0)
MONOS PCT: 6 %
Monocytes Absolute: 0.4 10*3/uL (ref 0.2–0.9)
NEUTROS ABS: 4.8 10*3/uL (ref 1.4–6.5)
NEUTROS PCT: 71 %
Platelets: 207 10*3/uL (ref 150–440)
RBC: 3.46 MIL/uL — AB (ref 3.80–5.20)
RDW: 14.6 % — ABNORMAL HIGH (ref 11.5–14.5)
WBC: 6.8 10*3/uL (ref 3.6–11.0)

## 2017-02-24 LAB — BASIC METABOLIC PANEL
Anion gap: 9 (ref 5–15)
BUN: 32 mg/dL — AB (ref 6–20)
CO2: 23 mmol/L (ref 22–32)
CREATININE: 1.63 mg/dL — AB (ref 0.44–1.00)
Calcium: 8.9 mg/dL (ref 8.9–10.3)
Chloride: 107 mmol/L (ref 101–111)
GFR, EST AFRICAN AMERICAN: 37 mL/min — AB (ref >60)
GFR, EST NON AFRICAN AMERICAN: 32 mL/min — AB (ref >60)
Glucose, Bld: 148 mg/dL — ABNORMAL HIGH (ref 65–99)
POTASSIUM: 4.7 mmol/L (ref 3.5–5.1)
SODIUM: 139 mmol/L (ref 135–145)

## 2017-02-24 LAB — HEPATIC FUNCTION PANEL
ALBUMIN: 3.5 g/dL (ref 3.5–5.0)
ALT: 17 U/L (ref 14–54)
AST: 18 U/L (ref 15–41)
Alkaline Phosphatase: 118 U/L (ref 38–126)
BILIRUBIN INDIRECT: 0.7 mg/dL (ref 0.3–0.9)
Bilirubin, Direct: 0.1 mg/dL (ref 0.1–0.5)
TOTAL PROTEIN: 6.7 g/dL (ref 6.5–8.1)
Total Bilirubin: 0.8 mg/dL (ref 0.3–1.2)

## 2017-02-24 LAB — TROPONIN I

## 2017-02-24 MED ORDER — SODIUM CHLORIDE 0.9 % IV BOLUS (SEPSIS)
1000.0000 mL | Freq: Once | INTRAVENOUS | Status: AC
  Administered 2017-02-24: 1000 mL via INTRAVENOUS

## 2017-02-24 MED ORDER — LORAZEPAM 2 MG/ML IJ SOLN
1.0000 mg | Freq: Once | INTRAMUSCULAR | Status: AC
  Administered 2017-02-24: 1 mg via INTRAVENOUS
  Filled 2017-02-24: qty 1

## 2017-02-24 NOTE — ED Notes (Signed)

## 2017-02-24 NOTE — ED Provider Notes (Signed)
[facility] Emergency Department Provider Note  ____________________________________________   First MD Initiated Contact with Patient 02/24/17 1850     (approximate)  I have reviewed the triage vital signs and the nursing notes.   HISTORY  Chief Complaint Dizziness   HPI Dorothy Rogers is a 66 y.o. female who comes to the emergency department via EMS with vertiginous symptoms that began when she awoke this morning. She said that every time she opens her eyes she becomes lightheaded feels the room is moving and that she was going to fall over. Symptoms have been constant since that began. Nothing in particular seems to make them better or worse. She has chronic low back pain and is status post repair several months ago. Yesterday her primary care physician prescribed her gabapentin for the first time she took 300 mg yesterday afternoon, 300 mg last night, and 300 mg this morning.   Past Medical History:  Diagnosis Date  . Anemia   . Anemia   . Arthritis    lower back, hips  . Blood transfusion without reported diagnosis   . CAD (coronary artery disease) FEB AND NOV 2009   6 STENTS  . 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 Cataract   . Chronic kidney disease    Stage III  . Coronary artery disease   . DDD (degenerative disc disease), lumbar   . Diabetes mellitus without complication ([place])    TYPE 2  . Herpes simplex   . Hyperlipidemia   . Hypertension    CONTROLLED ON MEDS  . Vertigo    occasional, no episodes in 2-3 months  . Vertigo    OCCASIONALLY  . Vitamin B 12 deficiency     Patient Active Problem List   Diagnosis Date Noted  . Cholelithiasis   . Protein-calorie malnutrition, severe 05/18/2016  . Supraventricular tachycardia ([place]) 05/05/2016  . SOB (shortness of breath)   . Near syncope 04/07/2016  . 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 Non-intractable cyclical vomiting with nausea   . Congenital esophageal  defect   . Hiatal hernia   . Gastritis   . Diarrhea 07/10/2015  . Diabetes mellitus ([place]) 07/02/2015  . Adjustment disorder 06/16/2015  . Abdominal pain 06/16/2015  . C. difficile colitis 06/16/2015  . Intractable nausea and vomiting 06/09/2015  . Dehydration 06/09/2015  . Type 2 diabetes mellitus ([place]) 06/09/2015  . HLD (hyperlipidemia) 06/09/2015  . HTN (hypertension) 06/09/2015  . CAD (coronary artery disease) 06/09/2015  . CKD (chronic kidney disease), stage III 06/09/2015  . Thrombocytopenia ([place]) 05/30/2015  . Lumbar canal stenosis 05/30/2015  . Disc disease with myelopathy, lumbar 05/30/2015  . Presence of stent in coronary artery 05/30/2015  . 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 Status post lumbar spinal fusion 12/25/2014  . DDD (degenerative disc disease), lumbar 11/18/2014  . Lumbar post-laminectomy syndrome 11/18/2014  . 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 Piriformis syndrome of left side 11/18/2014  . Status post lumbar spine operation 05/09/2014  . Adiposity 04/23/2014  . Thoracic and lumbosacral neuritis 03/27/2013  . H/O arthrodesis 03/27/2013  . Peripheral vascular disease ([place]) 03/22/2013  . Hypomagnesemia 03/22/2013  . Absolute anemia 03/22/2013    Past Surgical History:  Procedure Laterality Date  . [place] STUDY N/A 02/04/2016   Procedure: [place] STUDY;  Surgeon: Lucilla Lame, MD;  Location: [facility] ENDOSCOPY;  Service: Endoscopy;  Laterality: N/A;  . ABDOMINAL HYSTERECTOMY    . BACK SURGERY  Lumbar spinal fusion 05/30/2015  . CARDIAC CATHETERIZATION  2009  . CATARACT EXTRACTION Left   . CATARACT EXTRACTION W/PHACO Right 03/19/2015   Procedure: CATARACT EXTRACTION PHACO AND INTRAOCULAR LENS PLACEMENT ([place]);  Surgeon: Leandrew Koyanagi, MD;  Location: [place];  Service: Ophthalmology;  Laterality: Right;  DIABETIC - insulin and oral meds  . CHOLECYSTECTOMY N/A 05/19/2016   Procedure: LAPAROSCOPIC CHOLECYSTECTOMY;   Surgeon: Clayburn Pert, MD;  Location: [facility] ORS;  Service: General;  Laterality: N/A;  . CORONARY STENT PLACEMENT  03/2008  . CYSTECTOMY Right    wrist  . ESOPHAGEAL MANOMETRY N/A 02/04/2016   Procedure: ESOPHAGEAL MANOMETRY (EM);  Surgeon: Lucilla Lame, MD;  Location: [facility] ENDOSCOPY;  Service: Endoscopy;  Laterality: N/A;  . ESOPHAGOGASTRODUODENOSCOPY (EGD) WITH PROPOFOL N/A 09/04/2015   Procedure: ESOPHAGOGASTRODUODENOSCOPY (EGD) WITH PROPOFOL;  Surgeon: Lucilla Lame, MD;  Location: [place];  Service: Endoscopy;  Laterality: N/A;  INSULIN DEPENDENT DIABETIC  . ESOPHAGOGASTRODUODENOSCOPY (EGD) WITH PROPOFOL N/A 06/29/2016   Procedure: ESOPHAGOGASTRODUODENOSCOPY (EGD) WITH PROPOFOL;  Surgeon: Jonathon Bellows, MD;  Location: [facility] ENDOSCOPY;  Service: Endoscopy;  Laterality: N/A;  . EYE SURGERY Left    cataract extraction  . LUMBAR FUSION  2001, 2014, 2015  . [place]     2001, 2014, 2015  . TUMOR EXCISION     back of neck    Prior to Admission medications   Medication Sig Start Date End Date Taking? Authorizing Provider  acyclovir (ZOVIRAX) 400 MG tablet Take 400 mg by mouth as needed.  08/07/15   [provider]  aspirin EC 81 MG EC tablet Take 1 tablet (81 mg total) by mouth daily. 05/21/16   Nicholes Mango, MD  atorvastatin (LIPITOR) 40 MG tablet Take 40 mg by mouth at bedtime.  08/16/14   [provider]  calcium-vitamin D (OSCAL WITH D) 500-200 MG-UNIT tablet Take 1 tablet by mouth daily with breakfast.    [provider]  clopidogrel (PLAVIX) 75 MG tablet Take 75 mg by mouth daily.    [provider]  diltiazem (CARDIZEM CD) 120 MG 24 hr capsule Take 1 capsule by mouth daily. 05/04/16 05/04/17  [provider]  ferrous sulfate 325 (65 FE) MG EC tablet Take by mouth.    [provider]  ferrous sulfate 325 (65 FE) MG tablet Take 325 mg by mouth daily.    [provider]  glipiZIDE (GLUCOTROL) 10 MG tablet Take 10 mg by  mouth 2 (two) times daily.    [provider]  insulin glargine (LANTUS) 100 UNIT/ML injection Inject 10 Units into the skin daily.    [provider]  insulin lispro (HUMALOG) 100 UNIT/ML injection Inject 4-10 Units into the skin 3 (three) times daily with meals. Based on sliding scale. Mid range.    [provider]  lactulose (CEPHULAC) 20 g packet Take 1 packet (20 g total) by mouth 2 (two) times daily. Until having soft formed stools.  Then take one daily.  If having diarhea, hold medications 02/03/17   Merlyn Lot, MD  LORazepam (ATIVAN) 1 MG tablet Take 1 tablet (1 mg total) by mouth every 6 (six) hours as needed for anxiety. 05/09/16 05/09/17  Eula Listen, MD  losartan (COZAAR) 100 MG tablet Take 100 mg by mouth every morning. AM 02/05/14   [provider]  metoCLOPramide (REGLAN) 10 MG tablet Take by mouth. 06/03/16 06/03/17  [provider]  ondansetron (ZOFRAN ODT) 8 MG disintegrating tablet Take 8 mg by mouth as  needed for nausea or vomiting.    [provider]  prochlorperazine (COMPAZINE) 10 MG tablet Take 1 tablet (10 mg total) by mouth every 6 (six) hours as needed for nausea or vomiting. Patient not taking: Reported on 09/13/2016 05/14/16   Jules Husbands, MD  promethazine (PHENERGAN) 12.5 MG tablet Take 1 tablet (12.5 mg total) by mouth every 6 (six) hours as needed for nausea or vomiting. Patient not taking: Reported on 09/13/2016 05/12/16   Schuyler Amor, MD  sertraline (ZOLOFT) 50 MG tablet  05/10/16   [provider]  [facility] injection  07/20/16   [provider]  vitamin B-12 (CYANOCOBALAMIN) 1000 MCG tablet Take 1,000 mcg by mouth daily.    [provider]    Allergies Tramadol; Beta adrenergic blockers; Celebrex [celecoxib]; Niacin; Niacin and related; Oxycodone; Shellfish allergy; Vicodin [hydrocodone-acetaminophen]; and Heparin  Family History  Problem Relation Age of Onset  .  Heart disease Father   . Diabetes Other   . Breast cancer Neg Hx     Social History Social History  Substance Use Topics  . Smoking status: Never Smoker  . Smokeless tobacco: Never Used  . Alcohol use 0.0 oz/week     Comment: 2-3 drinks per year    Review of Systems Constitutional: No fever/chills Eyes: positive visual changes. ENT: No sore throat. Cardiovascular: Denies chest pain. Respiratory: Denies shortness of breath. Gastrointestinal: No abdominal pain.  Positive nausea, no vomiting.  No diarrhea.  No constipation. Genitourinary: Negative for dysuria. Musculoskeletal: Negative for back pain. Skin: Negative for rash. Neurological: positive for dizziness   ____________________________________________   PHYSICAL EXAM:  VITAL SIGNS: ED Triage Vitals [02/24/17 1847]  Enc Vitals Group     BP      Pulse      Resp      Temp      Temp src      SpO2      Weight      Height      Head Circumference      Peak Flow      Pain Score 4     Pain Loc      Pain Edu?      Excl. in [place]?     Constitutional: alert and oriented 4 holding her head in her hand appears uncomfortable no diaphoresis speaks in full clear sentences Eyes: PERRL EOMI.  no nystagmus noted Head: Atraumatic. Nose: No congestion/rhinnorhea. Mouth/Throat: No trismus Neck: No stridor.   Cardiovascular: Normal rate, regular rhythm. Grossly normal heart sounds.  Good peripheral circulation. Respiratory: Normal respiratory effort.  No retractions. Lungs CTAB and moving good air Gastrointestinal: soft nontender Musculoskeletal: No lower extremity edema   Neurologic:  Normal speech and language.  no pronator drift Normal finger-nose-finger no truncal ataxia although difficulty ambulating and unsteady on her feet Skin:  Skin is warm, dry and intact. No rash noted. Psychiatric: Mood and affect are normal. Speech and behavior are normal.    ____________________________________________   DIFFERENTIAL  includes but not limited to  peripheral vertigo, central vertigo, orthostasis, dehydration, medication adverse reaction ____________________________________________   LABS (all labs ordered are listed, but only abnormal results are displayed)  Labs Reviewed  BASIC METABOLIC PANEL - Abnormal; Notable for the following:       Result Value   Glucose, Bld 148 (*)    BUN 32 (*)    Creatinine, Ser 1.63 (*)    GFR calc non Af Amer 32 (*)    GFR calc  Af Amer 37 (*)    All other components within normal limits  CBC WITH DIFFERENTIAL/PLATELET - Abnormal; Notable for the following:    RBC 3.46 (*)    Hemoglobin 10.6 (*)    HCT 31.3 (*)    RDW 14.6 (*)    All other components within normal limits  HEPATIC FUNCTION PANEL  TROPONIN I  URINALYSIS, COMPLETE (UACMP) WITH MICROSCOPIC    blood work reviewed interpreted by me as normal aside from chronic kidney disease which is stable __________________________________________  EKG ED ECG REPORT I, Darel Hong, the attending physician, personally viewed and interpreted this ECG.  Date: 02/24/2017 EKG Time:  Rate: 72 Rhythm: normal sinus rhythm QRS Axis: normal Intervals: normal ST/T Wave abnormalities: normal Narrative Interpretation: no evidence of acute ischemia  ____________________________________________  RADIOLOGY  MRI were reviewed by me no acute pathology noted ____________________________________________   PROCEDURES  Procedure(s) performed: no  Procedures  Critical Care performed: no  Observation: no ____________________________________________   INITIAL IMPRESSION / ASSESSMENT AND PLAN / ED COURSE  Pertinent labs & imaging results that were available during my care of the patient were reviewed by me and considered in my medical decision making (see chart for details).  The patient arrives and miserable appearing with symptoms concerning for constant vertigo since this morning. I'm concerned that she may  have central vertigo.    Fortunately the patient's MRI is negative for acute pathology. She feels markedly improved after fluids and Ativan. She was able to get up out of bed and ambulate. This point I advised her to stop taking gabapentin and to follow up with her primary care physician this coming week for reevaluation. She is discharged home in improved condition. She had her husband verbalize understanding and agreement with the plan.  ____________________________________________   FINAL CLINICAL IMPRESSION(S) / ED DIAGNOSES  Final diagnoses:  Dizziness  Adverse effect of gabapentin, initial encounter      NEW MEDICATIONS STARTED DURING THIS VISIT:  New Prescriptions   No medications on file     Note:  This document was prepared using Dragon voice recognition software and may include unintentional dictation errors.     Darel Hong, MD 02/24/17 2326

## 2017-02-24 NOTE — ED Triage Notes (Signed)
Per EMS report, patient c/o dizziness and weakness since waking this morning. Patient was placed on Gabapentin yeasterday by PMD for spinal fusion that occurred on 8/24. Stroke was negative per EMTs. Patient is alert and oriented upon arrival.

## 2017-02-24 NOTE — ED Notes (Signed)
Lights were put on in the room and patient was put in a semi-Fowlers position from a supine per Dr. Mable Paris. Patient was able to reposition herself on the stretcher.

## 2017-02-24 NOTE — Discharge Instructions (Signed)
Fortunately today your MRI was very normal and you did not have a stroke. Please make an appointment to follow-up with your primary care physician this coming Monday and stop taking gabapentin.  It was a pleasure to take care of you today, and thank you for coming to our emergency department.  If you have any questions or concerns before leaving please ask the nurse to grab me and I'm more than happy to go through your aftercare instructions again.  If you were prescribed any opioid pain medication today such as Norco, Vicodin, Percocet, morphine, hydrocodone, or oxycodone please make sure you do not drive when you are taking this medication as it can alter your ability to drive safely.  If you have any concerns once you are home that you are not improving or are in fact getting worse before you can make it to your follow-up appointment, please do not hesitate to call [phone] and come back for further evaluation.  Darel Hong, MD  Results for orders placed or performed during the hospital encounter of 27/03/50  Basic metabolic panel  Result Value Ref Range   Sodium 139 135 - 145 mmol/L   Potassium 4.7 3.5 - 5.1 mmol/L   Chloride 107 101 - 111 mmol/L   CO2 23 22 - 32 mmol/L   Glucose, Bld 148 (H) 65 - 99 mg/dL   BUN 32 (H) 6 - 20 mg/dL   Creatinine, Ser 1.63 (H) 0.44 - 1.00 mg/dL   Calcium 8.9 8.9 - 10.3 mg/dL   GFR calc non Af Amer 32 (L) >60 mL/min   GFR calc Af Amer 37 (L) >60 mL/min   Anion gap 9 5 - 15  Hepatic function panel  Result Value Ref Range   Total Protein 6.7 6.5 - 8.1 g/dL   Albumin 3.5 3.5 - 5.0 g/dL   AST 18 15 - 41 U/L   ALT 17 14 - 54 U/L   Alkaline Phosphatase 118 38 - 126 U/L   Total Bilirubin 0.8 0.3 - 1.2 mg/dL   Bilirubin, Direct 0.1 0.1 - 0.5 mg/dL   Indirect Bilirubin 0.7 0.3 - 0.9 mg/dL  Troponin I  Result Value Ref Range   Troponin I <0.03 <0.03 ng/mL  CBC with Differential  Result Value Ref Range   WBC 6.8 3.6 - 11.0 K/uL   RBC 3.46 (L) 3.80 - 5.20  MIL/uL   Hemoglobin 10.6 (L) 12.0 - 16.0 g/dL   HCT 31.3 (L) 35.0 - 47.0 %   MCV 90.5 80.0 - 100.0 fL   MCH 30.7 26.0 - 34.0 pg   MCHC 33.9 32.0 - 36.0 g/dL   RDW 14.6 (H) 11.5 - 14.5 %   Platelets 207 150 - 440 K/uL   Neutrophils Relative % 71 %   Neutro Abs 4.8 1.4 - 6.5 K/uL   Lymphocytes Relative 21 %   Lymphs Abs 1.4 1.0 - 3.6 K/uL   Monocytes Relative 6 %   Monocytes Absolute 0.4 0.2 - 0.9 K/uL   Eosinophils Relative 1 %   Eosinophils Absolute 0.1 0 - 0.7 K/uL   Basophils Relative 1 %   Basophils Absolute 0.0 0 - 0.1 K/uL   Mr Brain Wo Contrast (neuro Protocol)  Result Date: 02/24/2017 CLINICAL DATA:  Dizziness and weakness beginning this morning. Status post spinal fusion January 21, 2017. History of hypertension, hyperlipidemia. EXAM: MRI HEAD WITHOUT CONTRAST TECHNIQUE: Multiplanar, multiecho pulse sequences of the brain and surrounding structures were obtained without intravenous contrast. COMPARISON:  CT  HEAD April 08, 2016 FINDINGS: BRAIN: No reduced diffusion to suggest acute ischemia. Solitary LEFT frontal chronic microhemorrhage. The ventricles and sulci are normal for patient's age. Diffuse punctate supratentorial white matter FLAIR T2 hyperintensities are less than expected for age compatible with mild chronic small vessel ischemic disease. No suspicious parenchymal signal, mass or mass effect. No abnormal extra-axial fluid collections. VASCULAR: Normal major intracranial vascular flow voids present at skull base. SKULL AND UPPER CERVICAL SPINE: No abnormal sellar expansion. No suspicious calvarial bone marrow signal. Craniocervical junction maintained. SINUSES/ORBITS: Trace paranasal sinus mucosal thickening. Mastoid air cells are well aerated. The included ocular globes and orbital contents are non-suspicious. Status post bilateral ocular lens implants. OTHER: None. IMPRESSION: Negative noncontrast MRI of the head for age. Electronically Signed   By: Elon Alas M.D.    On: 02/24/2017 20:59   Ct Abdomen Pelvis W Contrast  Result Date: 02/03/2017 CLINICAL DATA:  Constipation and abdomen pain. EXAM: CT ABDOMEN AND PELVIS WITH CONTRAST TECHNIQUE: Multidetector CT imaging of the abdomen and pelvis was performed using the standard protocol following bolus administration of intravenous contrast. CONTRAST:  24mL ISOVUE-300 IOPAMIDOL (ISOVUE-300) INJECTION 61% COMPARISON:  June 17, 2016 FINDINGS: Lower chest: 4 mm nodule is identified in the anterior right lung base image number 1 unchanged. Hepatobiliary: No focal liver abnormality is seen. Status post cholecystectomy. No biliary dilatation. Pancreas: Unremarkable. No pancreatic ductal dilatation or surrounding inflammatory changes. Spleen: Calcified granulomas are identified within the spleen. Spleen is otherwise unremarkable. Adrenals/Urinary Tract: Adrenal glands are unremarkable. Kidneys are normal, without renal calculi, focal lesion, or hydronephrosis. Bladder is unremarkable. Vascular calcifications are identified in the kidneys. Stomach/Bowel: There is a small hiatal hernia. Stomach is otherwise within normal limits. The appendix is not seen but no inflammation is noted around the cecum. No evidence of bowel wall thickening, distention, or inflammatory changes. Moderate bowel content is identified throughout colon. Vascular/Lymphatic: Aortic atherosclerosis. No enlarged abdominal or pelvic lymph nodes. Reproductive: Status post hysterectomy. No adnexal masses. Other: Small umbilical herniation of mesenteric fat is identified. Musculoskeletal: Prior fixation of lumbar spine are noted. IMPRESSION: Moderate bowel content identified in the colon. There is no bowel obstruction. Prior cholecystectomy and hysterectomy. Calcified granulomas within the spleen. Electronically Signed   By: Abelardo Diesel M.D.   On: 02/03/2017 18:07   Dg Abd Acute W/chest  Result Date: 01/30/2017 CLINICAL DATA:  Abdominal pain. Patient reports recent  back surgery. EXAM: DG ABDOMEN ACUTE W/ 1V CHEST COMPARISON:  Abdominal CT 06/17/2016 FINDINGS: The cardiomediastinal contours are normal. The lungs are clear. There is no free intra-abdominal air. No dilated bowel loops to suggest obstruction. Moderate volume of stool throughout the colon, with small recto sigmoid stool burden. No radiopaque calculi. Surgical clips in the right upper quadrant postcholecystectomy. Postsurgical change in the lumbar spine, suboptimally assessed on abdominal radiographs. Midline skin staples noted. IMPRESSION: 1. Moderate colonic stool burden. No evidence of obstruction or free air. 2. Clear lungs. Electronically Signed   By: Jeb Levering M.D.   On: 01/30/2017 18:07

## 2017-02-24 NOTE — ED Notes (Signed)
Patient is back from MRI  

## 2017-02-28 ENCOUNTER — Ambulatory Visit: Payer: Medicare Other | Admitting: Gastroenterology

## 2017-03-03 ENCOUNTER — Ambulatory Visit: Payer: Medicare Other | Admitting: Gastroenterology

## 2017-03-29 ENCOUNTER — Encounter: Payer: Self-pay | Admitting: Gastroenterology

## 2017-03-29 ENCOUNTER — Ambulatory Visit (INDEPENDENT_AMBULATORY_CARE_PROVIDER_SITE_OTHER): Payer: Medicare Other | Admitting: Gastroenterology

## 2017-03-29 VITALS — BP 127/85 | HR 101 | Temp 98.1°F | Ht 63.0 in | Wt 186.6 lb

## 2017-03-29 DIAGNOSIS — K3184 Gastroparesis: Secondary | ICD-10-CM | POA: Diagnosis not present

## 2017-03-29 DIAGNOSIS — K5900 Constipation, unspecified: Secondary | ICD-10-CM

## 2017-03-29 NOTE — Progress Notes (Signed)
Jonathon Bellows MD, MRCP(U.K) [address]  [place]  [place], [place] [address]  Main: [phone]  Fax: [phone]   Primary Care Physician: Idelle Crouch, MD  Primary Gastroenterologist:  Dr. Jonathon Bellows   Chief Complaint  Patient presents with  . Follow-up    HPI: Dorothy Rogers is a 66 y.o. female   Summary of history   I was consulted to see her on  05/17/16 for nausea and vomiting when admitted . Barium swallow with tablet 05/13/16 showed mild narrowing of GE jn with passage of tablet , possible mild wall thickening .She has had prior ER visits for same issues and discharges when it was attributed to anxiety. She has had a lap paraesophageal hernia repair with fundoplication in 94/1740 .EGD in 08/2015 by Dr Allen Norris for nausea/vomiting showed medium sized hiatal hernia, antral gastritis that was negative for H pylori on biopsy.At the time of her initial visit she was on Reglan ,had a positive HIDA scan , underwent a cholecystectomy. She did have elevated blood sugars ,some constpation.Gall bladder specimen showed chronic cholecystitis and cholelithiasis. Continued to have nausea after her cholecystectomy.Gastric emptying study showed only 27% of her stomach was emptied at 4 hour mark which is very abnormal and suggestive of gastroparesis   Interval history  08/2016 -03/29/2017   Weight stable since last visit. Doing well. Sugars stable. No vomiting . Has not been sick . Off Reglan. Occasional use of Zofran. Constipation is stable too. Eating back to normal.    Current Outpatient Prescriptions  Medication Sig Dispense Refill  . Insulin Pen Needle (FIFTY50 PEN NEEDLES) 32G X 4 MM MISC USE AS DIRECTED    . acetaminophen (TYLENOL) 500 MG tablet Take by mouth.    Marland Kitchen acyclovir (ZOVIRAX) 400 MG tablet Take 400 mg by mouth as needed.     Marland Kitchen aspirin EC 81 MG EC tablet Take 1 tablet (81 mg total) by mouth daily.    Marland Kitchen atorvastatin (LIPITOR) 40 MG tablet Take 40 mg by mouth at  bedtime.     . Biotin 10 MG TABS Take by mouth.    . Calcium Carb-Cholecalciferol 600-800 MG-UNIT CHEW Chew by mouth.    . calcium-vitamin D (OSCAL WITH D) 500-200 MG-UNIT tablet Take 1 tablet by mouth daily with breakfast.    . clopidogrel (PLAVIX) 75 MG tablet Take 75 mg by mouth daily.    Marland Kitchen diltiazem (CARDIZEM CD) 120 MG 24 hr capsule Take 1 capsule by mouth daily.    . ferrous sulfate 325 (65 FE) MG EC tablet Take by mouth.    . ferrous sulfate 325 (65 FE) MG tablet Take 325 mg by mouth daily.    . ferrous sulfate 325 (65 FE) MG tablet Take 325 mg by mouth.    . gabapentin (NEURONTIN) 100 MG capsule   0  . glipiZIDE (GLUCOTROL) 10 MG tablet Take 10 mg by mouth 2 (two) times daily.    Marland Kitchen HUMALOG KWIKPEN 100 UNIT/ML KiwkPen   2  . insulin glargine (LANTUS) 100 UNIT/ML injection Inject 10 Units into the skin daily.    Marland Kitchen lactulose (CEPHULAC) 20 g packet Take 1 packet (20 g total) by mouth 2 (two) times daily. Until having soft formed stools.  Then take one daily.  If having diarhea, hold medications 30 each 0  . lactulose (CHRONULAC) 10 GM/15ML solution   0  . LANTUS SOLOSTAR 100 UNIT/ML Solostar Pen   2  . LORazepam (ATIVAN) 1 MG tablet Take  1 tablet (1 mg total) by mouth every 6 (six) hours as needed for anxiety. 10 tablet 0  . losartan (COZAAR) 100 MG tablet Take 100 mg by mouth every morning. AM    . metoCLOPramide (REGLAN) 10 MG tablet Take by mouth.    . ondansetron (ZOFRAN ODT) 8 MG disintegrating tablet Take 8 mg by mouth as needed for nausea or vomiting.    . Probiotic Product (4X PROBIOTIC PO) Take by mouth.    . prochlorperazine (COMPAZINE) 10 MG tablet Take 1 tablet (10 mg total) by mouth every 6 (six) hours as needed for nausea or vomiting. (Patient not taking: Reported on 09/13/2016) 30 tablet 0  . promethazine (PHENERGAN) 12.5 MG tablet Take 1 tablet (12.5 mg total) by mouth every 6 (six) hours as needed for nausea or vomiting. (Patient not taking: Reported on 09/13/2016) 10 tablet  0  . sertraline (ZOLOFT) 50 MG tablet   0  . SHINGRIX injection   0  . vitamin B-12 (CYANOCOBALAMIN) 1000 MCG tablet Take 1,000 mcg by mouth daily.     No current facility-administered medications for this visit.     Allergies as of 03/29/2017 - Review Complete 03/29/2017  Allergen Reaction Noted  . Tramadol Swelling 03/11/2016  . Beta adrenergic blockers Hives 11/18/2014  . Celebrex [celecoxib] Other (See Comments) 11/18/2014  . Gabapentin Other (See Comments) 03/03/2017  . Hydrocodone-acetaminophen Other (See Comments) 03/22/2013  . Niacin Other (See Comments) 08/26/2015  . Niacin and related Other (See Comments) 11/18/2014  . Oxycodone Nausea Only 08/26/2015  . Shellfish allergy Nausea And Vomiting 11/18/2014  . Vicodin [hydrocodone-acetaminophen] Other (See Comments) 11/18/2014  . Heparin Rash 12/01/2015  . Other Rash 10/07/2015    ROS:  General: Negative for anorexia, weight loss, fever, chills, fatigue, weakness. ENT: Negative for hoarseness, difficulty swallowing , nasal congestion. CV: Negative for chest pain, angina, palpitations, dyspnea on exertion, peripheral edema.  Respiratory: Negative for dyspnea at rest, dyspnea on exertion, cough, sputum, wheezing.  GI: See history of present illness. GU:  Negative for dysuria, hematuria, urinary incontinence, urinary frequency, nocturnal urination.  Endo: Negative for unusual weight change.    Physical Examination:   BP 127/85 (BP Location: Right Arm, Patient Position: Sitting, Cuff Size: Normal)   Pulse (!) 101   Temp 98.1 F (36.7 C) (Oral)   Ht 5\' 3"  (1.6 m)   Wt 186 lb 9.6 oz (84.6 kg)   BMI 33.05 kg/m   General: Well-nourished, well-developed in no acute distress.  Eyes: No icterus. Conjunctivae pink. Mouth: Oropharyngeal mucosa moist and pink , no lesions erythema or exudate. Lungs: Clear to auscultation bilaterally. Non-labored. Heart: Regular rate and rhythm, no murmurs rubs or gallops.  Abdomen: Bowel  sounds are normal, nontender, nondistended, no hepatosplenomegaly or masses, no abdominal bruits or hernia , no rebound or guarding.   Extremities: No lower extremity edema. No clubbing or deformities. Neuro: Alert and oriented x 3.  Grossly intact. Skin: Warm and dry, no jaundice.   Psych: Alert and cooperative, normal mood and affect.   Imaging Studies: No results found.  Assessment and Plan:   Dorothy Rogers is a 66 y.o. y/o female here for follow up for diabetic gastroparesis and constipation.   1. Gastroparesis- doing well off all medications . Counseled on life style changes Reiterated tight glycemic control ,low fiber diet and low fat in food, weight loss  2. Constipation - stable     Dr Jonathon Bellows  MD,MRCP [facility]) Follow up in PRN

## 2017-08-05 ENCOUNTER — Ambulatory Visit
Admission: RE | Admit: 2017-08-05 | Discharge: 2017-08-05 | Disposition: A | Discharge status: Home / Self Care | Payer: Medicare Other | Source: Ambulatory Visit | Attending: Physician Assistant | Admitting: Physician Assistant

## 2017-08-05 ENCOUNTER — Other Ambulatory Visit: Payer: Self-pay | Admitting: Physician Assistant

## 2017-08-05 DIAGNOSIS — R002 Palpitations: Secondary | ICD-10-CM

## 2017-08-05 DIAGNOSIS — R0609 Other forms of dyspnea: Secondary | ICD-10-CM

## 2017-08-05 DIAGNOSIS — K449 Diaphragmatic hernia without obstruction or gangrene: Secondary | ICD-10-CM | POA: Diagnosis not present

## 2017-08-05 DIAGNOSIS — I517 Cardiomegaly: Secondary | ICD-10-CM | POA: Insufficient documentation

## 2017-08-05 DIAGNOSIS — I700 Atherosclerosis of aorta: Secondary | ICD-10-CM | POA: Diagnosis not present

## 2017-08-05 DIAGNOSIS — D71 Functional disorders of polymorphonuclear neutrophils: Secondary | ICD-10-CM | POA: Diagnosis not present

## 2017-08-05 LAB — POCT I-STAT CREATININE: Creatinine, Ser: 1.7 mg/dL — ABNORMAL HIGH (ref 0.44–1.00)

## 2017-08-05 MED ORDER — IOPAMIDOL (ISOVUE-370) INJECTION 76%
75.0000 mL | Freq: Once | INTRAVENOUS | Status: AC | PRN
  Administered 2017-08-05: 60 mL via INTRAVENOUS

## 2017-08-17 ENCOUNTER — Other Ambulatory Visit: Payer: Self-pay | Admitting: Cardiology

## 2017-08-18 ENCOUNTER — Encounter
Admission: RE | Disposition: A | Discharge status: Home / Self Care | Payer: Self-pay | Source: Ambulatory Visit | Attending: Cardiology

## 2017-08-18 ENCOUNTER — Encounter: Payer: Self-pay | Admitting: *Deleted

## 2017-08-18 ENCOUNTER — Ambulatory Visit
Admission: RE | Admit: 2017-08-18 | Discharge: 2017-08-18 | Disposition: A | Discharge status: Home / Self Care | Payer: Medicare Other | Source: Ambulatory Visit | Attending: Cardiology | Admitting: Cardiology

## 2017-08-18 DIAGNOSIS — I129 Hypertensive chronic kidney disease with stage 1 through stage 4 chronic kidney disease, or unspecified chronic kidney disease: Secondary | ICD-10-CM | POA: Insufficient documentation

## 2017-08-18 DIAGNOSIS — Z885 Allergy status to narcotic agent status: Secondary | ICD-10-CM | POA: Insufficient documentation

## 2017-08-18 DIAGNOSIS — G8929 Other chronic pain: Secondary | ICD-10-CM | POA: Diagnosis not present

## 2017-08-18 DIAGNOSIS — E1151 Type 2 diabetes mellitus with diabetic peripheral angiopathy without gangrene: Secondary | ICD-10-CM | POA: Insufficient documentation

## 2017-08-18 DIAGNOSIS — Z794 Long term (current) use of insulin: Secondary | ICD-10-CM | POA: Diagnosis not present

## 2017-08-18 DIAGNOSIS — E782 Mixed hyperlipidemia: Secondary | ICD-10-CM | POA: Diagnosis not present

## 2017-08-18 DIAGNOSIS — R079 Chest pain, unspecified: Secondary | ICD-10-CM

## 2017-08-18 DIAGNOSIS — Z8249 Family history of ischemic heart disease and other diseases of the circulatory system: Secondary | ICD-10-CM | POA: Insufficient documentation

## 2017-08-18 DIAGNOSIS — I2510 Atherosclerotic heart disease of native coronary artery without angina pectoris: Secondary | ICD-10-CM | POA: Diagnosis not present

## 2017-08-18 DIAGNOSIS — N183 Chronic kidney disease, stage 3 (moderate): Secondary | ICD-10-CM | POA: Diagnosis not present

## 2017-08-18 DIAGNOSIS — Z7902 Long term (current) use of antithrombotics/antiplatelets: Secondary | ICD-10-CM | POA: Insufficient documentation

## 2017-08-18 DIAGNOSIS — Z7982 Long term (current) use of aspirin: Secondary | ICD-10-CM | POA: Insufficient documentation

## 2017-08-18 DIAGNOSIS — Z955 Presence of coronary angioplasty implant and graft: Secondary | ICD-10-CM | POA: Insufficient documentation

## 2017-08-18 DIAGNOSIS — K3184 Gastroparesis: Secondary | ICD-10-CM | POA: Insufficient documentation

## 2017-08-18 DIAGNOSIS — E1122 Type 2 diabetes mellitus with diabetic chronic kidney disease: Secondary | ICD-10-CM | POA: Insufficient documentation

## 2017-08-18 DIAGNOSIS — E1143 Type 2 diabetes mellitus with diabetic autonomic (poly)neuropathy: Secondary | ICD-10-CM | POA: Insufficient documentation

## 2017-08-18 HISTORY — PX: LEFT HEART CATH AND CORONARY ANGIOGRAPHY: CATH118249

## 2017-08-18 LAB — GLUCOSE, CAPILLARY: Glucose-Capillary: 112 mg/dL — ABNORMAL HIGH (ref 65–99)

## 2017-08-18 SURGERY — LEFT HEART CATH AND CORONARY ANGIOGRAPHY
Anesthesia: Moderate Sedation | Laterality: Left

## 2017-08-18 MED ORDER — MIDAZOLAM HCL 2 MG/2ML IJ SOLN
INTRAMUSCULAR | Status: DC | PRN
  Administered 2017-08-18: 1 mg via INTRAVENOUS

## 2017-08-18 MED ORDER — MIDAZOLAM HCL 2 MG/2ML IJ SOLN
INTRAMUSCULAR | Status: AC
  Filled 2017-08-18: qty 2

## 2017-08-18 MED ORDER — SODIUM CHLORIDE 0.9 % IV SOLN: 250.0000 mL | INTRAVENOUS | Status: DC | PRN

## 2017-08-18 MED ORDER — FENTANYL CITRATE (PF) 100 MCG/2ML IJ SOLN
INTRAMUSCULAR | Status: AC
  Filled 2017-08-18: qty 2

## 2017-08-18 MED ORDER — IOPAMIDOL (ISOVUE-300) INJECTION 61%
INTRAVENOUS | Status: DC | PRN
  Administered 2017-08-18: 95 mL via INTRA_ARTERIAL

## 2017-08-18 MED ORDER — SODIUM CHLORIDE 0.9% FLUSH: 3.0000 mL | INTRAVENOUS | Status: DC | PRN

## 2017-08-18 MED ORDER — ASPIRIN 81 MG PO CHEW
81.0000 mg | CHEWABLE_TABLET | ORAL | Status: AC
  Administered 2017-08-18: 81 mg via ORAL

## 2017-08-18 MED ORDER — SODIUM CHLORIDE 0.9 % WEIGHT BASED INFUSION: 1.0000 mL/kg/h | INTRAVENOUS | Status: DC

## 2017-08-18 MED ORDER — SODIUM CHLORIDE 0.9% FLUSH: 3.0000 mL | Freq: Two times a day (BID) | INTRAVENOUS | Status: DC

## 2017-08-18 MED ORDER — HEPARIN (PORCINE) IN NACL 2-0.9 UNIT/ML-% IJ SOLN
INTRAMUSCULAR | Status: AC
  Filled 2017-08-18: qty 1000

## 2017-08-18 MED ORDER — SODIUM CHLORIDE 0.9 % WEIGHT BASED INFUSION
3.0000 mL/kg/h | INTRAVENOUS | Status: AC
  Administered 2017-08-18: 3 mL/kg/h via INTRAVENOUS

## 2017-08-18 MED ORDER — FENTANYL CITRATE (PF) 100 MCG/2ML IJ SOLN
INTRAMUSCULAR | Status: DC | PRN
  Administered 2017-08-18: 25 ug via INTRAVENOUS

## 2017-08-18 MED ORDER — ASPIRIN 81 MG PO CHEW
CHEWABLE_TABLET | ORAL | Status: AC
  Filled 2017-08-18: qty 1

## 2017-08-18 SURGICAL SUPPLY — 10 items
CATH INFINITI 5 FR 3DRC (CATHETERS) ×3 IMPLANT
CATH INFINITI 5FR ANG PIGTAIL (CATHETERS) ×3 IMPLANT
CATH INFINITI 5FR JL4 (CATHETERS) ×3 IMPLANT
CATH INFINITI JR4 5F (CATHETERS) ×3 IMPLANT
DEVICE CLOSURE MYNXGRIP 5F (Vascular Products) ×3 IMPLANT
KIT MANI 3VAL PERCEP (MISCELLANEOUS) ×3 IMPLANT
NEEDLE PERC 18GX7CM (NEEDLE) ×3 IMPLANT
PACK CARDIAC CATH (CUSTOM PROCEDURE TRAY) ×3 IMPLANT
SHEATH AVANTI 5FR X 11CM (SHEATH) ×3 IMPLANT
WIRE GUIDERIGHT .035X150 (WIRE) ×3 IMPLANT

## 2017-08-18 NOTE — H&P (Signed)
Chief Complaint: Chief Complaint  Patient presents with  . Shortness of Breath  worsening with any exerction  . Tachycardia  comes with the shortness of breath  . Chest Pain  tightness comes with the shortness of breath  . Fatigue  i do have some and I have to rest or it feels like I cant go on  Date of Service: 08/12/2017 Date of Birth: 03-24-1951 PCP: Idelle Crouch, MD  History of Present Illness: Dorothy Rogers is a 67 y.o.female patient who returns for evaluation. Patient has a history of coronary artery disease. She recently had symptoms prompting her to present to [facility]. She underwent extensive evaluation at [facility] including left heart cath which revealed patent stents and no progression of her disease. Remainder of her noninvasive evaluation resulted with a negative workup. Holter monitor was unremarkable. She is now noting worsening exertional chest pain. This is similar to what she had prior to her stents being placed. She denies orthopnea or PND. Pain occurs with exertion and improves with rest. EKG revealed sinus rhythm at a rate of 92 with a PR interval of 138 ms, QRS duration of 84 ms with a QTC of 442 ms. There were no ischemic changes. Patient is compliant with her medications. Past Medical and Surgical History  Past Medical History Past Medical History:  Diagnosis Date  . Anemia, unspecified  . Chronic pain  . Coronary artery disease  stents x 6  . Degenerative disk disease  . Diabetes mellitus type 2, uncomplicated (CMS-HCC)  . Encounter for blood transfusion  no reaction  . History of degenerative disc disease  . Hyperlipemia, unspecified  . Hypertension  . Kidney disease  . Malignant fibrous histiocytoma (CMS-HCC)  . Peripheral neuropathy  . Peripheral vascular disease (CMS-HCC)   Past Surgical History She has a past surgical history that includes Back surgery; transforaminal lumbar interbody fusion minimally invasive; Bone graft hip iliac  crest; fibrohistiocytoma excision (1986); Hysterectomy; Tooth extraction (01/2013); cyst removed; posterior lumbar spine fusion one level (Bilateral, 03/27/2013); instrumentation posterior spine 1/2 vertebral segments w/o fixation (Bilateral, 03/27/2013); laminectomy posterior lumbar facetectomy & foraminotomy w/decomp (Bilateral, 03/27/2013); laminectomy posterior cervicle decomp w/facetectomy & foraminotomy (Bilateral, 03/27/2013); exploration spinal fusion (Bilateral, 03/27/2013); autograft structural icbg obtained separate incision for spine surgery (Right, 03/27/2013); laminectomy lumbar excison/evacuation extradural intraspinal lesion (Bilateral, 03/27/2013); PTCA with stent placement x6; Cataract extraction (Left, 11/14/13); posterior lumbar spine fusion one level (Bilateral, 05/09/2014); instrumentation posterior spine 1/2 vertebral segments w/o fixation (Bilateral, 05/09/2014); laminotomy reexploration posterior lumbar w/nerve decomp & discectomy (Bilateral, 05/09/2014); laminotomy reexploration posterior lumbar w/nerve decomp & discectomy (Bilateral, 05/09/2014); exploration spinal fusion (Bilateral, 05/09/2014); removal posterior segmental lumbar/thoracic spinal hardware (Bilateral, 05/09/2014); autograft structural icbg obtained separate incision for spine surgery (Bilateral, 05/09/2014); Eye surgery (Left, 2015); Eye surgery (Right, 03/2015); posterior lumbar spine fusion one level lateral transverse technique (Bilateral, 05/30/2015); instrumentation posterior spine 3 to 6 vertebral segments (Bilateral, 05/30/2015); laminotomy reexploration posterior lumbar w/nerve decomp & discectomy (Bilateral, 05/30/2015); laminotomy reexploration posterior lumbar w/nerve decomp & discectomy (Bilateral, 05/30/2015); removal posterior segmental lumbar/thoracic spinal hardware (Bilateral, 05/30/2015); exploration spinal fusion (Bilateral, 05/30/2015); autograft structural icbg obtained separate incision for spine surgery  (Left, 05/30/2015); repair hiatal hernia (02/2016); Hernia repair; Cholecystectomy (04/2016); posterior lumbar spine fusion one level lateral transverse technique (Bilateral, 01/21/2017); instrumentation posterior spine 3 to 6 vertebral segments (Bilateral, 01/21/2017); laminectomy posterior lumbar facetectomy & foraminotomy w/decomp (Bilateral, 01/21/2017); laminectomy posterior cervicle decomp w/facetectomy & foraminotomy (Bilateral, 01/21/2017); exploration spinal fusion (Bilateral,  01/21/2017); removal posterior segmental lumbar/thoracic spinal hardware (Bilateral, 01/21/2017); and insertion morselized bone allograft for spine surgery (N/A, 01/21/2017).   Medications and Allergies  Current Medications  Current Outpatient Medications  Medication Sig Dispense Refill  . acyclovir (ZOVIRAX) 400 MG tablet TAKE 1 TABLET BY MOUTH ONCE DAILY 30 tablet 0  . aspirin 81 MG EC tablet Take 81 mg by mouth nightly.   . B infantis/B ani/B lon/B bifid (PROBIOTIC 4X ORAL) Take 1 capsule by mouth once daily.  . biotin 10 mg Tab Take 10 mg by mouth once daily. Biotin with zinc   . calcium carbonate-vitamin D3 (CALTRATE 600+D) 600 mg(1,500mg ) -400 unit tablet Take 1 tablet by mouth once daily.  . clopidogrel (PLAVIX) 75 mg tablet Take 1 tablet (75 mg total) by mouth once daily 90 tablet 3  . cyanocobalamin (VITAMIN B12) 1000 MCG tablet Take 1,000 mcg by mouth once daily.   . ferrous sulfate 325 (65 FE) MG tablet Take 325 mg by mouth daily with breakfast.  . glipiZIDE (GLUCOTROL) 10 MG tablet TAKE ONE TABLET BY MOUTH TWICE DAILY BEFORE MEAL(S) 180 tablet 3  . insulin GLARGINE (LANTUS) injection (concentration 100 units/mL) Inject 32 Units subcutaneously once daily. She takes at 12 noon. 10 mL 0  . insulin LISPRO (HUMALOG) injection (concentration 100 units/mL) Inject 4-5 Units subcutaneously as needed for High Blood Sugar. If BG is >200 then she will take PRN Humalog  . LORazepam (ATIVAN) 0.5 MG tablet Take 1 tablet  (0.5 mg total) by mouth 2 (two) times daily 60 tablet 0  . losartan (COZAAR) 100 MG tablet TAKE ONE TABLET BY MOUTH ONCE DAILY 90 tablet 3  . ondansetron (ZOFRAN-ODT) 8 MG disintegrating tablet Take 8 mg by mouth every 8 (eight) hours as needed for Nausea.  . verapamil (CALAN-SR) 180 MG SR tablet Take 1 tablet (180 mg total) by mouth once daily 30 tablet 11  . acetaminophen (TYLENOL) 500 MG tablet Take 500 mg by mouth every 6 (six) hours as needed for Pain.  . pen needle, diabetic 32 gauge x 5/32" Ndle USE AS DIRECTED 150 each 3   No current facility-administered medications for this visit.   Allergies: Shellfish containing products; Beta-blockers (beta-adrenergic blocking agts); Celebrex [celecoxib]; Gabapentin; Niacin; Oxycodone; Tramadol; Vicodin [hydrocodone-acetaminophen]; and Heparin analogues  Social and Family History  Social History reports that she has never smoked. She has never used smokeless tobacco. She reports that she drinks alcohol. She reports that she does not use drugs.  Family History Family History  Problem Relation Age of Onset  . Coronary Artery Disease (Blocked arteries around heart) Father  . Myocardial Infarction (Heart attack) Father  . Thyroid disease Sister  . Asthma Brother  . Diabetes Maternal Grandmother   Review of Systems  Review of Systems  Constitutional: Negative for chills, diaphoresis, fever, malaise/fatigue and weight loss.  HENT: Negative for congestion, ear discharge, hearing loss and tinnitus.  Eyes: Negative for blurred vision.  Respiratory: Positive for shortness of breath. Negative for cough, hemoptysis, sputum production and wheezing.  Cardiovascular: Negative for chest pain, orthopnea, claudication and PND.  Gastrointestinal: Positive for nausea and vomiting. Negative for abdominal pain, blood in stool, constipation, diarrhea, heartburn and melena.  Genitourinary: Negative for dysuria, frequency, hematuria and urgency.   Musculoskeletal: Positive for back pain. Negative for falls, joint pain and myalgias.  Skin: Negative for itching and rash.  Neurological: Negative for dizziness, tingling, focal weakness, loss of consciousness, weakness and headaches.  Endo/Heme/Allergies: Negative for polydipsia. Does not  bruise/bleed easily.  Psychiatric/Behavioral: Negative for depression, memory loss and substance abuse. The patient is not nervous/anxious.    Physical Examination   Vitals: BP 136/74  Pulse 103  Resp 12  Ht 158.8 cm (5' 2.5")  Wt 86.2 kg (190 lb)  SpO2 97%  BMI 34.20 kg/m  Ht:158.8 cm (5' 2.5") Wt:86.2 kg (190 lb) ZSW:FUXN surface area is 1.95 meters squared. Body mass index is 34.2 kg/m.  Wt Readings from Last 3 Encounters:  08/12/17 86.2 kg (190 lb)  08/11/17 86.2 kg (190 lb)  08/05/17 86.3 kg (190 lb 3.2 oz)   BP Readings from Last 3 Encounters:  08/12/17 136/74  08/11/17 178/84  07/25/17 158/76   General appearance appears in no acute distress  Head Mouth and Eye exam Normocephalic, without obvious abnormality, atraumatic Dentition is good Eyes appear anicteric   Neck exam Thyroid: normal  Nodes: no obvious adenopathy  LUNGS Breath Sounds: Normal Percussion: Normal  CARDIOVASCULAR JVP CV wave: no HJR: no Elevation at 90 degrees: None Carotid Pulse: normal pulsation bilaterally Bruit: None Apex: apical impulse normal  Auscultation Rhythm: normal sinus rhythm S1: normal S2: normal Clicks: no Rub: no Murmurs: no murmurs  Gallop: None ABDOMEN Liver enlargement: no Pulsatile aorta: no Ascites: no Bruits: no  EXTREMITIES Clubbing: no Edema: 1+ bilateral pedal edema Pulses: peripheral pulses symmetrical Femoral Bruits: no Amputation: no SKIN Rash: no Cyanosis: no Embolic phemonenon: no Bruising: no NEURO Alert and Oriented to person, place and time: yes Non focal: yes  PSYCH: Pt appears to have normal affect  LABS REVIEWED Last 3 CBC  results: Lab Results  Component Value Date  WBC 9.2 08/12/2017  WBC 7.0 07/18/2017  WBC 10.2 04/18/2017   Lab Results  Component Value Date  HGB 11.5 (L) 08/12/2017  HGB 11.0 (L) 07/18/2017  HGB 10.7 (L) 04/18/2017   Lab Results  Component Value Date  HCT 35.7 08/12/2017  HCT 34.1 (L) 07/18/2017  HCT 33.3 (L) 04/18/2017   Lab Results  Component Value Date  PLT 223 08/12/2017  PLT 233 07/18/2017  PLT 227 04/18/2017   Lab Results  Component Value Date  CREATININE 1.8 (H) 08/12/2017  BUN 35 (H) 08/12/2017  NA 137 08/12/2017  K 5.0 08/12/2017  CL 108 08/12/2017  CO2 21.2 (L) 08/12/2017   Lab Results  Component Value Date  HGBA1C 8.3 (H) 07/18/2017   Lab Results  Component Value Date  HDL 28.5 (L) 04/18/2017  HDL 32.5 (L) 10/13/2016  HDL 31.3 (L) 12/26/2015   Lab Results  Component Value Date  LDLCALC 97 04/18/2017  LDLCALC 105 10/13/2016  LDLCALC 97 12/26/2015   Lab Results  Component Value Date  TRIG 182 04/18/2017  TRIG 160 10/13/2016  TRIG 141 12/26/2015   Lab Results  Component Value Date  ALT 17 07/18/2017  AST 11 07/18/2017  ALKPHOS 106 (H) 07/18/2017   Lab Results  Component Value Date  TSH 4.386 04/18/2017   Assessment and Plan   67 y.o. female with  ICD-10-CM ICD-9-CM  1. Increased heart rate-appears to have sinus rhythm at an increased rate. Does not have as many palpitations recently however. She she is gradually attempting increase her activity. R00.0 785.0  2. Coronary artery disease involving native coronary artery of native heart without angina pectoris-currently stable despite multiple stents. Cardiac catheterization at [facility] showed no critical disease with patent stents. Will continue with dual anti-platelet therapy, daily exercise as tolerated and low-fat cardiac diet. Her symptoms have progressed and now with they  are similar to what she had prior to her PCI. This can occur predominantly with exertion but occasionally  with rest. Plan to pursue left heart cath to evaluate coronary anatomy with further recommendations after this is complete. I25.10 414.01  3. Mixed hyperlipidemia-continue with atorvastatin and Zetia. Liver function is normal. LDL is currently 97. LDL goal of less than 100. Continue with low-fat diet. E78.2 272.2  4. Essential hypertension with goal blood pressure less than 130/80-continue with current regimen for blood pressure including dash diet I10 401.9  5. PVD (peripheral vascular disease) I73.9 443.9  6. CKD (chronic kidney disease), stage III-will follow-up. Creatinine is currently 1.5. N18.3 585.3   7. Continue to hydrate daily. Her gastroparesis appears to be resolving.  Return in about 1 month (around 09/09/2017).  These notes generated with voice recognition software. I apologize for typographical errors.  Sydnee Levans, MD    Pt seen and examined. No change from above.

## 2017-08-25 ENCOUNTER — Emergency Department: Payer: Medicare Other

## 2017-08-25 ENCOUNTER — Emergency Department
Admission: EM | Admit: 2017-08-25 | Discharge: 2017-08-25 | Disposition: A | Discharge status: Home / Self Care | Payer: Medicare Other | Attending: Emergency Medicine | Admitting: Emergency Medicine

## 2017-08-25 ENCOUNTER — Other Ambulatory Visit: Payer: Self-pay

## 2017-08-25 DIAGNOSIS — R0602 Shortness of breath: Secondary | ICD-10-CM | POA: Diagnosis not present

## 2017-08-25 DIAGNOSIS — E1122 Type 2 diabetes mellitus with diabetic chronic kidney disease: Secondary | ICD-10-CM | POA: Diagnosis not present

## 2017-08-25 DIAGNOSIS — N289 Disorder of kidney and ureter, unspecified: Secondary | ICD-10-CM | POA: Diagnosis not present

## 2017-08-25 DIAGNOSIS — N183 Chronic kidney disease, stage 3 (moderate): Secondary | ICD-10-CM | POA: Diagnosis not present

## 2017-08-25 DIAGNOSIS — I2510 Atherosclerotic heart disease of native coronary artery without angina pectoris: Secondary | ICD-10-CM | POA: Insufficient documentation

## 2017-08-25 DIAGNOSIS — I129 Hypertensive chronic kidney disease with stage 1 through stage 4 chronic kidney disease, or unspecified chronic kidney disease: Secondary | ICD-10-CM | POA: Diagnosis not present

## 2017-08-25 LAB — COMPREHENSIVE METABOLIC PANEL
ALBUMIN: 3.8 g/dL (ref 3.5–5.0)
ALT: 19 U/L (ref 14–54)
ANION GAP: 9 (ref 5–15)
AST: 19 U/L (ref 15–41)
Alkaline Phosphatase: 99 U/L (ref 38–126)
BILIRUBIN TOTAL: 0.7 mg/dL (ref 0.3–1.2)
BUN: 39 mg/dL — ABNORMAL HIGH (ref 6–20)
CALCIUM: 8.4 mg/dL — AB (ref 8.9–10.3)
CO2: 20 mmol/L — ABNORMAL LOW (ref 22–32)
Chloride: 106 mmol/L (ref 101–111)
Creatinine, Ser: 2.14 mg/dL — ABNORMAL HIGH (ref 0.44–1.00)
GFR calc non Af Amer: 23 mL/min — ABNORMAL LOW (ref >60)
GFR, EST AFRICAN AMERICAN: 26 mL/min — AB (ref >60)
GLUCOSE: 312 mg/dL — AB (ref 65–99)
POTASSIUM: 5.1 mmol/L (ref 3.5–5.1)
SODIUM: 135 mmol/L (ref 135–145)
TOTAL PROTEIN: 7.1 g/dL (ref 6.5–8.1)

## 2017-08-25 LAB — CBC WITH DIFFERENTIAL/PLATELET
BASOS PCT: 1 %
Basophils Absolute: 0 10*3/uL (ref 0–0.1)
EOS ABS: 0.1 10*3/uL (ref 0–0.7)
Eosinophils Relative: 1 %
HEMATOCRIT: 32.6 % — AB (ref 35.0–47.0)
Hemoglobin: 10.9 g/dL — ABNORMAL LOW (ref 12.0–16.0)
LYMPHS ABS: 1.5 10*3/uL (ref 1.0–3.6)
Lymphocytes Relative: 18 %
MCH: 31.1 pg (ref 26.0–34.0)
MCHC: 33.4 g/dL (ref 32.0–36.0)
MCV: 93.1 fL (ref 80.0–100.0)
MONO ABS: 0.7 10*3/uL (ref 0.2–0.9)
MONOS PCT: 8 %
Neutro Abs: 6 10*3/uL (ref 1.4–6.5)
Neutrophils Relative %: 72 %
Platelets: 208 10*3/uL (ref 150–440)
RBC: 3.5 MIL/uL — ABNORMAL LOW (ref 3.80–5.20)
RDW: 13.1 % (ref 11.5–14.5)
WBC: 8.2 10*3/uL (ref 3.6–11.0)

## 2017-08-25 LAB — TROPONIN I: Troponin I: <0.03 ng/mL (ref <0.03)

## 2017-08-25 NOTE — ED Notes (Signed)
Pt sitting in lobby with husband, no distress noted; pt & SO updated on wait time & plan of care; reviewed protocols; pt denies any c/o at present; st she is here because of [facility] on exertion; denies accomp symptoms with such and denies symptoms at present; st it has been going on for awhile, her doctors are aware of it; had card cath last week with normal findings

## 2017-08-25 NOTE — ED Provider Notes (Signed)
[facility] Emergency Department Provider Note  ____________________________________________  Time seen: Approximately 10:31 PM  I have reviewed the triage vital signs and the nursing notes.   HISTORY  Chief Complaint Shortness of Breath    HPI Dorothy Rogers is a 67 y.o. female with a history of CAD status post stents presenting for exertional shortness of breath.  The patient reports that for the last several months, she has had exertional shortness of breath.  At this time, she becomes completely out of breath when she takes a shower or when she gets stressed.  Today, she did not have any new or different symptoms.  She reports that she went to eat at a restaurant and when she walked back to her car, she got short of breath.  Her primary care physician has been evaluating her and she has had a CT that was negative for any fluid or blood clots, she has also seen her cardiologist and had a heart catheterization last week which did not show any acute findings.  Today, the patient denies any chest pain, palpitations, lightheadedness or syncope, lower extremity swelling or calf pain.  No fevers, chills, cough or cold symptoms.  On arrival to the emergency department, the patient's oxygen saturations are 100% on room air.  Past Medical History:  Diagnosis Date  . Anemia   . Anemia   . Arthritis    lower back, hips  . Blood transfusion without reported diagnosis   . CAD (coronary artery disease) FEB AND NOV 2009   6 STENTS  . Cancer ([place])    tumor back of neck-fibroushistocytoma  . Cataract   . Chronic kidney disease    Stage III  . Coronary artery disease   . DDD (degenerative disc disease), lumbar   . Diabetes mellitus without complication ([place])    TYPE 2  . Herpes simplex   . Hyperlipidemia   . Hypertension    CONTROLLED ON MEDS  . Vertigo    occasional, no episodes in 2-3 months  . Vertigo    OCCASIONALLY  . Vitamin B 12 deficiency     Patient  Active Problem List   Diagnosis Date Noted  . Bilateral sciatica 11/29/2016  . Gastroparesis due to DM ([place]) 11/26/2016  . Cholelithiasis   . Protein-calorie malnutrition, severe 05/18/2016  . Supraventricular tachycardia ([place]) 05/05/2016  . History of paroxysmal supraventricular tachycardia 05/05/2016  . SOB (shortness of breath)   . Near syncope 04/07/2016  . 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 Non-intractable cyclical vomiting with nausea   . Congenital esophageal defect   . Hiatal hernia   . Gastritis   . Diarrhea 07/10/2015  . Diabetes mellitus ([place]) 07/02/2015  . Adjustment disorder 06/16/2015  . Abdominal pain 06/16/2015  . C. difficile colitis 06/16/2015  . Intractable nausea and vomiting 06/09/2015  . Dehydration 06/09/2015  . Type 2 diabetes mellitus with diabetic neuropathy ([place]) 06/09/2015  . Hyperlipemia 06/09/2015  . Hypertension 06/09/2015  . CAD (coronary artery disease) 06/09/2015  . CKD (chronic kidney disease), stage III ([place]) 06/09/2015  . Thrombocytopenia ([place]) 05/30/2015  . Lumbar canal stenosis 05/30/2015  . Disc disease with myelopathy, lumbar 05/30/2015  . Presence of stent in coronary artery 05/30/2015  . 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 Status post lumbar spinal fusion 12/25/2014  . DDD (degenerative disc disease), lumbar 11/18/2014  . Lumbar post-laminectomy syndrome 11/18/2014  . Facet syndrome, lumbar 11/18/2014  . Piriformis syndrome of left side 11/18/2014  . Status post lumbar  spine operation 05/09/2014  . Adiposity 04/23/2014  . Thoracic and lumbosacral neuritis 03/27/2013  . H/O arthrodesis 03/27/2013  . Peripheral vascular disease ([place]) 03/22/2013  . Hypomagnesemia 03/22/2013  . Absolute anemia 03/22/2013    Past Surgical History:  Procedure Laterality Date  . [place] STUDY N/A 02/04/2016   Procedure: [place] STUDY;  Surgeon: Lucilla Lame, MD;  Location: [facility] ENDOSCOPY;  Service: Endoscopy;  Laterality: N/A;  .  ABDOMINAL HYSTERECTOMY    . BACK SURGERY     Lumbar spinal fusion 05/30/2015  . CARDIAC CATHETERIZATION  2009  . CATARACT EXTRACTION Left   . CATARACT EXTRACTION W/PHACO Right 03/19/2015   Procedure: CATARACT EXTRACTION PHACO AND INTRAOCULAR LENS PLACEMENT ([place]);  Surgeon: Leandrew Koyanagi, MD;  Location: [place];  Service: Ophthalmology;  Laterality: Right;  DIABETIC - insulin and oral meds  . CHOLECYSTECTOMY N/A 05/19/2016   Procedure: LAPAROSCOPIC CHOLECYSTECTOMY;  Surgeon: Clayburn Pert, MD;  Location: [facility] ORS;  Service: General;  Laterality: N/A;  . CORONARY STENT PLACEMENT  03/2008  . CYSTECTOMY Right    wrist  . ESOPHAGEAL MANOMETRY N/A 02/04/2016   Procedure: ESOPHAGEAL MANOMETRY (EM);  Surgeon: Lucilla Lame, MD;  Location: [facility] ENDOSCOPY;  Service: Endoscopy;  Laterality: N/A;  . ESOPHAGOGASTRODUODENOSCOPY (EGD) WITH PROPOFOL N/A 09/04/2015   Procedure: ESOPHAGOGASTRODUODENOSCOPY (EGD) WITH PROPOFOL;  Surgeon: Lucilla Lame, MD;  Location: [place];  Service: Endoscopy;  Laterality: N/A;  INSULIN DEPENDENT DIABETIC  . ESOPHAGOGASTRODUODENOSCOPY (EGD) WITH PROPOFOL N/A 06/29/2016   Procedure: ESOPHAGOGASTRODUODENOSCOPY (EGD) WITH PROPOFOL;  Surgeon: Jonathon Bellows, MD;  Location: [facility] ENDOSCOPY;  Service: Endoscopy;  Laterality: N/A;  . EYE SURGERY Left    cataract extraction  . LEFT HEART CATH AND CORONARY ANGIOGRAPHY Left 08/18/2017   Procedure: LEFT HEART CATH AND CORONARY ANGIOGRAPHY;  Surgeon: Teodoro Spray, MD;  Location: [facility];  Service: Cardiovascular;  Laterality: Left;  . LUMBAR FUSION  2001, 2014, 2015  . [place]     2001, 2014, 2015  . TUMOR EXCISION     back of neck    Current Outpatient Rx  . Order #: [id number] Class: Historical Med  . Order #: [id number] Class: Historical Med  . Order #: [id number] Class: Historical Med  . Order #: [id number] Class: OTC  . Order #: [id number] Class: Historical Med  . Order #: [id number] Class:  Historical Med  . Order #: [id number] Class: Historical Med  . Order #: [id number] Class: Historical Med  . Order #: [id number] Class: Historical Med  . Order #: [id number] Class: Historical Med  . Order #: [id number] Class: Historical Med  . Order #: [id number] Class: Historical Med  . Order #: [id number] Class: Historical Med  . Order #: [id number] Class: Historical Med  . Order #: [id number] Class: Historical Med  . Order #: [id number] Class: Historical Med  . Order #: [id number] Class: Historical Med  . Order #: [id number] Class: Historical Med    Allergies Tramadol; Beta adrenergic blockers; Celebrex [celecoxib]; Gabapentin; Niacin and related; Oxycodone; Shellfish allergy; Vicodin [hydrocodone-acetaminophen]; and Heparin  Family History  Problem Relation Age of Onset  . Heart disease Father   . Diabetes Other   . Breast cancer Neg Hx     Social History Social History   Tobacco Use  . Smoking status: Never Smoker  . Smokeless tobacco: Never Used  Substance Use Topics  . Alcohol use: Yes    Alcohol/week: 0.0 oz    Comment: 2-3 drinks per year  . Drug use: No    Review of Systems Constitutional: No fever/chills.  No lightheadedness or  syncope. Eyes: No visual changes. ENT: No sore throat. No congestion or rhinorrhea. Cardiovascular: Denies chest pain. Denies palpitations. Respiratory: Positive exertional shortness of breath.  No cough. Gastrointestinal: No abdominal pain.  No nausea, no vomiting.  No diarrhea.  No constipation. Genitourinary: Negative for dysuria. Musculoskeletal: Negative for back pain.  No lower extremity swelling or calf pain. Skin: Negative for rash. Neurological: Negative for headaches. No focal numbness, tingling or weakness.     ____________________________________________   PHYSICAL EXAM:  VITAL SIGNS: ED Triage Vitals  Enc Vitals Group     BP 08/25/17 1813 (!) 125/46     Pulse Rate 08/25/17 1813 (!) 56     Resp 08/25/17 1813 17     Temp 08/25/17  1813 98.4 F (36.9 C)     Temp Source 08/25/17 1813 Oral     SpO2 08/25/17 1813 98 %     Weight 08/25/17 1811 187 lb (84.8 kg)     Height 08/25/17 1811 5\' 2"  (1.575 m)     Head Circumference --      Peak Flow --      Pain Score 08/25/17 1811 0     Pain Loc --      Pain Edu? --      Excl. in [place]? --     Constitutional: Alert and oriented. Well appearing and in no acute distress. Answers questions appropriately. Eyes: Conjunctivae are normal.  EOMI. No scleral icterus. Head: Atraumatic. Nose: No congestion/rhinnorhea. Mouth/Throat: Mucous membranes are moist.  Neck: No stridor.  Supple.  No JVD.  No meningismus. Cardiovascular: Normal rate, regular rhythm. No murmurs, rubs or gallops.  Respiratory: Normal respiratory effort.  No accessory muscle use or retractions. Lungs CTAB.  No wheezes, rales or ronchi.  The patient is able to speak in full sentences without distress.  She has O2 sats of 100% on room air. Gastrointestinal: Soft, nontender and nondistended.  No guarding or rebound.  No peritoneal signs. Musculoskeletal: No LE edema. No ttp in the calves or palpable cords.  Negative Homan's sign. Neurologic:  A&Ox3.  Speech is clear.  Face and smile are symmetric.  EOMI.  Moves all extremities well. Skin:  Skin is warm, dry and intact. No rash noted. Psychiatric: Mood and affect are normal. Speech and behavior are normal.  Normal judgement.  ____________________________________________   LABS (all labs ordered are listed, but only abnormal results are displayed)  Labs Reviewed  CBC WITH DIFFERENTIAL/PLATELET - Abnormal; Notable for the following components:      Result Value   RBC 3.50 (*)    Hemoglobin 10.9 (*)    HCT 32.6 (*)    All other components within normal limits  COMPREHENSIVE METABOLIC PANEL - Abnormal; Notable for the following components:   CO2 20 (*)    Glucose, Bld 312 (*)    BUN 39 (*)    Creatinine, Ser 2.14 (*)    Calcium 8.4 (*)    GFR calc non Af Amer  23 (*)    GFR calc Af Amer 26 (*)    All other components within normal limits  TROPONIN I   ____________________________________________  EKG  ED ECG REPORT I, Eula Listen, the attending physician, personally viewed and interpreted this ECG.   Date: 08/25/2017  EKG Time: 1824  Rate: 94  Rhythm: normal sinus rhythm  Axis: Normal   Intervals:none  ST&T Change: No STEMI  ____________________________________________  RADIOLOGY  Dg Chest 2 View  Result Date: 08/25/2017 CLINICAL DATA:  Shortness of  Breath EXAM: CHEST - 2 VIEW COMPARISON:  Chest radiograph January 30, 2017 and chest CT August 05, 2017 FINDINGS: There is no edema or consolidation. Heart size and pulmonary vascularity are normal. No adenopathy. There is calcification in the right coronary artery. There is postoperative change in the upper lumbar spine. IMPRESSION: No edema or consolidation. Right coronary artery calcification. No adenopathy. Electronically Signed   By: Lowella Grip III M.D.   On: 08/25/2017 18:58    ____________________________________________   PROCEDURES  Procedure(s) performed: None  Procedures  Critical Care performed: No ____________________________________________   INITIAL IMPRESSION / ASSESSMENT AND PLAN / ED COURSE  Pertinent labs & imaging results that were available during my care of the patient were reviewed by me and considered in my medical decision making (see chart for details).  67 y.o. email with several months of exertional shortness of breath that has been extensively worked up as an outpatient presenting for ongoing unchanged symptoms that are similar in character and severity to her prior episodes.  Overall, the patient is hemodynamically stable.  From a pulmonary standpoint, I do not see any abnormalities today.  She has clear lungs, she is able to speak in full sentences, she has no evidence of upper airway obstruction, her chest x-ray is not show any acute  abnormalities, and her O2 saturations and respiratory rate have been normal throughout the entirety of her stay.  From a cardiac standpoint, her EKG does not show any ischemic changes and she her troponin is negative.  The patient does have some mild renal insufficiency and I have encouraged her to drink plenty of fluid and follow-up with her primary care physician to have her creatinine rechecked.  At this time, the patient is safe for discharge home.  She will follow-up with her primary care physician, and she and her husband understand return precautions as well as follow-up instructions  ____________________________________________  FINAL CLINICAL IMPRESSION(S) / ED DIAGNOSES  Final diagnoses:  Exertional shortness of breath  Acute renal insufficiency         NEW MEDICATIONS STARTED DURING THIS VISIT:  New Prescriptions   No medications on file      Eula Listen, MD 08/25/17 2237

## 2017-08-25 NOTE — Discharge Instructions (Addendum)
These return to the emergency department if you develop severe pain, chest pain, palpitations, lightheadedness or fainting, or any other symptoms concerning to you.  Please make an appointment for Dr. Doy Hutching for continued follow-up and also have Dr. Doy Hutching recheck your kidney function.  Drink plenty of fluid to help hydrate your kidneys.

## 2017-08-25 NOTE — ED Triage Notes (Signed)
Pt c/o shortness of breath x2 hours - pt denies chest pain, headaches, dizziness - pt had heart cath last week and it "was clear"

## 2017-08-25 NOTE — ED Notes (Signed)
FIRST NURSE NOTE-unlabored. Here for dyspnea.  NAD. alert

## 2017-09-13 ENCOUNTER — Ambulatory Visit: Payer: Medicare Other | Admitting: Pulmonary Disease

## 2017-09-13 ENCOUNTER — Ambulatory Visit (INDEPENDENT_AMBULATORY_CARE_PROVIDER_SITE_OTHER): Payer: Medicare Other | Admitting: Pulmonary Disease

## 2017-09-13 ENCOUNTER — Encounter: Payer: Self-pay | Admitting: Pulmonary Disease

## 2017-09-13 VITALS — BP 150/80 | HR 86 | Ht 62.0 in | Wt 191.0 lb

## 2017-09-13 DIAGNOSIS — K449 Diaphragmatic hernia without obstruction or gangrene: Secondary | ICD-10-CM

## 2017-09-13 DIAGNOSIS — R0609 Other forms of dyspnea: Secondary | ICD-10-CM

## 2017-09-13 DIAGNOSIS — F458 Other somatoform disorders: Secondary | ICD-10-CM

## 2017-09-13 MED ORDER — OMEPRAZOLE 40 MG PO CPDR: 40.0000 mg | DELAYED_RELEASE_CAPSULE | Freq: Every day | ORAL | 10 refills | Status: DC

## 2017-09-13 NOTE — Progress Notes (Signed)
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 Mild cardiomegaly.  No acute pulmonary process.Old granulomatous disease. Small hiatal hernia with air-fluid level  INTERVAL HISTORY: Last seen 02/24/16. Failed to follow up  SUBJ: Previously seen in 2017 with what was thought to be primary hyperventilation syndrome. She underwent hiatal hernia repair and after recovery from that surgery, she had no further episodes of dyspnea. Therefore, she did not follow up as scheduled. More recently, she has again developed exertional dyspnea and chest pain. She states that "this feels different" than before. She has undergone extensive cardiac evaluation ([place]) including LHC without evidence of a cardiac etiology to her symptoms. She also c/o of NP cough. She is using Spiriva without discernible benefit. Denies fever, purulent sputum, hemoptysis, LE edema and calf tenderness. Notably, she is not on any antacid therapy.   OBJ: Vitals:   09/13/17 1428 09/13/17 1430  BP:  (!) 150/80  Pulse:  86  SpO2:  99%  Weight: 191 lb (86.6 kg)   Height: 5\' 2"  (1.575 m)    Gen: NAD HEENT: NCAT, sclera white Neck: No JVD Lungs: breath sounds full, no wheezes or other adventitious sounds Cardiovascular: RRR, no murmurs Abdomen: Soft, nontender, normal BS Ext: without clubbing, cyanosis, edema Neuro: grossly intact Skin: Limited exam, no lesions noted    DATA: BMP Latest Ref Rng & Units 08/25/2017 08/05/2017 02/24/2017  Glucose 65 - 99 mg/dL 312(H) - 148(H)  BUN 6 - 20 mg/dL 39(H) - 32(H)  Creatinine 0.44 - 1.00 mg/dL 2.14(H) 1.70(H) 1.63(H)  Sodium 135 - 145 mmol/L 135 - 139  Potassium 3.5 - 5.1 mmol/L 5.1 - 4.7  Chloride 101 - 111 mmol/L 106 - 107  CO2 22 - 32 mmol/L 20(L) - 23  Calcium 8.9 - 10.3 mg/dL 8.4(L) - 8.9    CBC Latest Ref Rng & Units 08/25/2017 02/24/2017  02/03/2017  WBC 3.6 - 11.0 K/uL 8.2 6.8 10.6  Hemoglobin 12.0 - 16.0 g/dL 10.9(L) 10.6(L) 11.4(L)  Hematocrit 35.0 - 47.0 % 32.6(L) 31.3(L) 32.7(L)  Platelets 150 - 440 K/uL 208 207 328    CXR 08/25/17: [facility]  IMPRESSION: DOE - 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

## 2017-09-13 NOTE — Patient Instructions (Addendum)
Omeprazole 40 mg daily taken at dinner time Stop Spiriva inhaler Full lung function tests (PFTs) ordered Follow up in 6-8 weeks after PFTs

## 2017-10-25 ENCOUNTER — Ambulatory Visit: Payer: Medicare Other | Attending: Pulmonary Disease

## 2017-10-25 DIAGNOSIS — R0609 Other forms of dyspnea: Secondary | ICD-10-CM | POA: Diagnosis not present

## 2017-11-01 ENCOUNTER — Ambulatory Visit (INDEPENDENT_AMBULATORY_CARE_PROVIDER_SITE_OTHER): Payer: Medicare Other | Admitting: Pulmonary Disease

## 2017-11-01 ENCOUNTER — Encounter: Payer: Self-pay | Admitting: Pulmonary Disease

## 2017-11-01 VITALS — BP 138/88 | HR 103 | Ht 62.0 in | Wt 190.0 lb

## 2017-11-01 DIAGNOSIS — R0609 Other forms of dyspnea: Secondary | ICD-10-CM | POA: Diagnosis not present

## 2017-11-01 NOTE — Progress Notes (Signed)
PROBLEMS: Unexplained episodic dyspnea  DATA: 09/26/17Spirometry: FVC 2.72 (91%), FEV1 1.93 (85%), FEV1/FVC 71%  08/05/17 CT chest: No acute pulmonary embolism. Mild cardiomegaly.  No acute pulmonary process.Old granulomatous disease. Small hiatal hernia with air-fluid level 10/25/17 PFTs: No obstruction, lung volumes normal.  Diffusion capacity measurement invalid (VA<<TLC), DLCO/VA normal  INTERVAL HISTORY: Last seen 09/13/2017.  No major pulmonary events  SUBJ: Last visit, I initiated her on omeprazole.  She took it for 3 to 4 weeks without any improvement.  Last visit, I recommended that she discontinue Spiriva and she noted no deterioration in her symptoms with this.  She is here to review pulmonary function tests which are documented above.  She continues to have episodic rest dyspnea and persistent exertional dyspnea.  Her husband states that when she is complaining of shortness of breath, she does not appear to be out of breath to him.  She can think of no triggering or mitigating factors.  There is no day-to-day variation.  There is no seasonal component.  Previously, her symptoms got markedly better after surgery for hiatal hernia.  However, they have relapsed.  Her most recent CT scan does show persistent hiatal hernia, albeit small.  She denies CP, fever, purulent sputum, hemoptysis, LE edema and calf tenderness.  OBJ: Vitals:   11/01/17 1142 11/01/17 1146  BP:  138/88  Pulse:  (!) 103  SpO2:  100%  Weight: 190 lb (86.2 kg)   Height: 5\' 2"  (1.575 m)   RA  Gen: Obese, NAD HEENT: NCAT, sclera white Neck: No JVD Lungs: breath sounds full, no wheezes or other adventitious sounds Cardiovascular: RRR, no murmurs Abdomen: Soft, nontender, normal BS Ext: without clubbing, cyanosis, edema Neuro: grossly intact Skin: Limited exam, no lesions noted     DATA: BMP Latest Ref Rng & Units 08/25/2017 08/05/2017 02/24/2017  Glucose 65 - 99 mg/dL 312(H) - 148(H)  BUN 6 - 20 mg/dL 39(H)  - 32(H)  Creatinine 0.44 - 1.00 mg/dL 2.14(H) 1.70(H) 1.63(H)  Sodium 135 - 145 mmol/L 135 - 139  Potassium 3.5 - 5.1 mmol/L 5.1 - 4.7  Chloride 101 - 111 mmol/L 106 - 107  CO2 22 - 32 mmol/L 20(L) - 23  Calcium 8.9 - 10.3 mg/dL 8.4(L) - 8.9    CBC Latest Ref Rng & Units 08/25/2017 02/24/2017 02/03/2017  WBC 3.6 - 11.0 K/uL 8.2 6.8 10.6  Hemoglobin 12.0 - 16.0 g/dL 10.9(L) 10.6(L) 11.4(L)  Hematocrit 35.0 - 47.0 % 32.6(L) 31.3(L) 32.7(L)  Platelets 150 - 440 K/uL 208 207 328    CXR 08/25/17: [facility]  IMPRESSION: Unexplained dyspnea   -entirely normal PFTs make a pulmonary cause unlikely.  She has undergone an extensive cardiac evaluation making a cardiac cause unlikely as well.  I suspect this is either deconditioning or psychogenic  PLAN: Cardiopulmonary stress test to hopefully point [place] in a direction for further evaluation  If normal or consistent with deconditioning, no further evaluation will be warranted Follow-up in 3 to 4 weeks   Merton Border, MD PCCM service Mobile [phone] Pager [phone] 11/01/2017

## 2017-11-01 NOTE — Patient Instructions (Signed)
Cardiopulmonary stress test ordered Follow-up in 3 to 4 weeks

## 2017-11-08 ENCOUNTER — Encounter (HOSPITAL_COMMUNITY): Payer: Medicare Other

## 2017-11-29 ENCOUNTER — Ambulatory Visit (HOSPITAL_COMMUNITY): Payer: Medicare Other | Attending: Pulmonary Disease

## 2017-11-29 DIAGNOSIS — R0600 Dyspnea, unspecified: Secondary | ICD-10-CM | POA: Insufficient documentation

## 2017-11-29 DIAGNOSIS — R0609 Other forms of dyspnea: Secondary | ICD-10-CM

## 2017-12-07 DIAGNOSIS — G4733 Obstructive sleep apnea (adult) (pediatric): Secondary | ICD-10-CM | POA: Insufficient documentation

## 2017-12-12 ENCOUNTER — Other Ambulatory Visit: Payer: Self-pay | Admitting: Internal Medicine

## 2017-12-12 DIAGNOSIS — Z1231 Encounter for screening mammogram for malignant neoplasm of breast: Secondary | ICD-10-CM

## 2017-12-26 ENCOUNTER — Encounter: Payer: Self-pay | Admitting: Pulmonary Disease

## 2017-12-26 ENCOUNTER — Ambulatory Visit (INDEPENDENT_AMBULATORY_CARE_PROVIDER_SITE_OTHER): Payer: Medicare Other | Admitting: Pulmonary Disease

## 2017-12-26 VITALS — BP 140/68 | HR 111 | Resp 16 | Ht 62.0 in | Wt 189.0 lb

## 2017-12-26 DIAGNOSIS — R000 Tachycardia, unspecified: Secondary | ICD-10-CM | POA: Diagnosis not present

## 2017-12-26 DIAGNOSIS — E668 Other obesity: Secondary | ICD-10-CM

## 2017-12-26 DIAGNOSIS — R5381 Other malaise: Secondary | ICD-10-CM | POA: Diagnosis not present

## 2017-12-26 DIAGNOSIS — R0609 Other forms of dyspnea: Secondary | ICD-10-CM | POA: Diagnosis not present

## 2017-12-26 NOTE — Patient Instructions (Addendum)
Continue to work on Scientist, research (medical) with regular exercise Recommend modest weight loss (15 pounds) Follow-up in 6 months or sooner as needed

## 2017-12-26 NOTE — Progress Notes (Signed)
PROBLEMS: Unexplained episodic dyspnea  DATA: 09/26/17Spirometry: FVC 2.72 (91%), FEV1 1.93 (85%), FEV1/FVC 71%  08/05/17 CT chest: No acute pulmonary embolism. Mild cardiomegaly.  No acute pulmonary process.Old granulomatous disease. Small hiatal hernia with air-fluid level 10/25/17 PFTs: No obstruction, lung volumes normal.  Diffusion capacity measurement invalid, DLCO/VA normal 11/30/17 CPST: The interpretation of this test is limited due to submaximal effort during the exercise. Based on available data, exercise testing with gas exchange demonstrates low-normal functional capacity when compared to matched sedentary norms. Patient's body habitus, deconditioning is playing a significant role in her exercise intolerance. However, other submaximal parameters are suggestive of possible circulatory limitation. Note the tachycardia at rest and little to no change in BP during the incremental exercise  INTERVAL HISTORY: Last seen 11/01/17.  No major pulmonary events.  Here to discuss results of CPST  SUBJ: No changes overall.  Since last visit, she has had her calcium channel blocker changed to diltiazem.  He continues to experience severe limitation due to exertional dyspnea.  She is also markedly limited by chronic knee arthritis which impairs her ability to undertake an exercise program.  Otherwise, she has no new complaints.  OBJ: Vitals:   12/26/17 1408 12/26/17 1414  BP:  140/68  Pulse:  (!) 111  Resp: 16   SpO2:  100%  Weight: 189 lb (85.7 kg)   Height: 5\' 2"  (1.575 m)   RA Gen: NAD HEENT: NCAT, sclera white Neck: No JVD Lungs: breath sounds full, no wheezes or other adventitious sounds Cardiovascular: RRR, no murmurs Abdomen: Soft, nontender, normal BS Ext: without clubbing, cyanosis, edema Neuro: grossly intact Skin: Limited exam, no lesions noted    DATA: BMP Latest Ref Rng & Units 08/25/2017 08/05/2017 02/24/2017  Glucose 65 - 99 mg/dL 312(H) - 148(H)  BUN 6 - 20 mg/dL 39(H)  - 32(H)  Creatinine 0.44 - 1.00 mg/dL 2.14(H) 1.70(H) 1.63(H)  Sodium 135 - 145 mmol/L 135 - 139  Potassium 3.5 - 5.1 mmol/L 5.1 - 4.7  Chloride 101 - 111 mmol/L 106 - 107  CO2 22 - 32 mmol/L 20(L) - 23  Calcium 8.9 - 10.3 mg/dL 8.4(L) - 8.9    CBC Latest Ref Rng & Units 08/25/2017 02/24/2017 02/03/2017  WBC 3.6 - 11.0 K/uL 8.2 6.8 10.6  Hemoglobin 12.0 - 16.0 g/dL 10.9(L) 10.6(L) 11.4(L)  Hematocrit 35.0 - 47.0 % 32.6(L) 31.3(L) 32.7(L)  Platelets 150 - 440 K/uL 208 207 328    CXR: No new film  IMPRESSION:   ICD-10-CM   1. DOE (dyspnea on exertion) R06.09   2. Sinus tachycardia R00.0   3. Moderate obesity E66.8   4. Physical deconditioning R53.81    After extensive evaluation, it seems clear that she does not have a pulmonary problem to account for her exertional dyspnea.  The only notable finding thus far has been a persistence of sinus tachycardia and an inordinate heart rate response to minimal exertion.  Some of this could be due to a primary cardiac problem.  I suspect much of it is due to deconditioning and there might be a perceptual/psychogenic component.  She does have a tendency for anxiety (by her own admission).  Her initial presentation to the emergency department in 2017 was highly suggestive of hyperventilation syndrome.  Moderate obesity might be a minor contributor to her overall symptomatology  PLAN: We discussed my impression as documented above in detail. I agree with the change from amlodipine to diltiazem which will help attenuate her heart rate response I  recommended that she continue to work on reconditioning program with regular exercise I recommend modest weight loss (15 pounds) Her husband requests 1 more follow-up with me.  Follow-up in 6 months or sooner as needed   Merton Border, MD PCCM service Mobile [phone] Pager [phone] 12/26/2017

## 2017-12-28 ENCOUNTER — Encounter: Payer: Self-pay | Admitting: Pulmonary Disease

## 2018-01-17 ENCOUNTER — Ambulatory Visit
Admission: RE | Admit: 2018-01-17 | Discharge: 2018-01-17 | Disposition: A | Discharge status: Home / Self Care | Payer: Medicare Other | Source: Ambulatory Visit | Attending: Internal Medicine | Admitting: Internal Medicine

## 2018-01-17 ENCOUNTER — Ambulatory Visit: Payer: Medicare Other

## 2018-01-17 DIAGNOSIS — Z1231 Encounter for screening mammogram for malignant neoplasm of breast: Secondary | ICD-10-CM | POA: Diagnosis not present

## 2018-05-17 DIAGNOSIS — R700 Elevated erythrocyte sedimentation rate: Secondary | ICD-10-CM | POA: Insufficient documentation

## 2018-05-17 DIAGNOSIS — M25552 Pain in left hip: Secondary | ICD-10-CM | POA: Insufficient documentation

## 2018-05-17 DIAGNOSIS — G8929 Other chronic pain: Secondary | ICD-10-CM | POA: Insufficient documentation

## 2018-06-03 ENCOUNTER — Emergency Department: Payer: Medicare Other

## 2018-06-03 ENCOUNTER — Other Ambulatory Visit: Payer: Self-pay

## 2018-06-03 ENCOUNTER — Emergency Department
Admission: EM | Admit: 2018-06-03 | Discharge: 2018-06-03 | Disposition: A | Discharge status: Home / Self Care | Payer: Medicare Other | Attending: Emergency Medicine | Admitting: Emergency Medicine

## 2018-06-03 DIAGNOSIS — H9202 Otalgia, left ear: Secondary | ICD-10-CM | POA: Diagnosis present

## 2018-06-03 DIAGNOSIS — I129 Hypertensive chronic kidney disease with stage 1 through stage 4 chronic kidney disease, or unspecified chronic kidney disease: Secondary | ICD-10-CM | POA: Insufficient documentation

## 2018-06-03 DIAGNOSIS — N183 Chronic kidney disease, stage 3 (moderate): Secondary | ICD-10-CM | POA: Diagnosis not present

## 2018-06-03 DIAGNOSIS — I2510 Atherosclerotic heart disease of native coronary artery without angina pectoris: Secondary | ICD-10-CM | POA: Insufficient documentation

## 2018-06-03 DIAGNOSIS — Z955 Presence of coronary angioplasty implant and graft: Secondary | ICD-10-CM | POA: Diagnosis not present

## 2018-06-03 DIAGNOSIS — E1122 Type 2 diabetes mellitus with diabetic chronic kidney disease: Secondary | ICD-10-CM | POA: Diagnosis not present

## 2018-06-03 DIAGNOSIS — R110 Nausea: Secondary | ICD-10-CM | POA: Diagnosis not present

## 2018-06-03 DIAGNOSIS — Z79899 Other long term (current) drug therapy: Secondary | ICD-10-CM | POA: Diagnosis not present

## 2018-06-03 DIAGNOSIS — R51 Headache: Secondary | ICD-10-CM | POA: Diagnosis not present

## 2018-06-03 DIAGNOSIS — E1140 Type 2 diabetes mellitus with diabetic neuropathy, unspecified: Secondary | ICD-10-CM | POA: Insufficient documentation

## 2018-06-03 DIAGNOSIS — Z85828 Personal history of other malignant neoplasm of skin: Secondary | ICD-10-CM | POA: Insufficient documentation

## 2018-06-03 DIAGNOSIS — M792 Neuralgia and neuritis, unspecified: Secondary | ICD-10-CM | POA: Insufficient documentation

## 2018-06-03 DIAGNOSIS — Z794 Long term (current) use of insulin: Secondary | ICD-10-CM | POA: Diagnosis not present

## 2018-06-03 DIAGNOSIS — Z7902 Long term (current) use of antithrombotics/antiplatelets: Secondary | ICD-10-CM | POA: Diagnosis not present

## 2018-06-03 LAB — CBC WITH DIFFERENTIAL/PLATELET
Abs Immature Granulocytes: 0.02 10*3/uL (ref 0.00–0.07)
Basophils Absolute: 0 10*3/uL (ref 0.0–0.1)
Basophils Relative: 0 %
Eosinophils Absolute: 0.1 10*3/uL (ref 0.0–0.5)
Eosinophils Relative: 1 %
HCT: 33.6 % — ABNORMAL LOW (ref 36.0–46.0)
Hemoglobin: 11.1 g/dL — ABNORMAL LOW (ref 12.0–15.0)
Immature Granulocytes: 0 %
Lymphocytes Relative: 23 %
Lymphs Abs: 1.7 10*3/uL (ref 0.7–4.0)
MCH: 31 pg (ref 26.0–34.0)
MCHC: 33 g/dL (ref 30.0–36.0)
MCV: 93.9 fL (ref 80.0–100.0)
Monocytes Absolute: 0.5 10*3/uL (ref 0.1–1.0)
Monocytes Relative: 7 %
Neutro Abs: 5 10*3/uL (ref 1.7–7.7)
Neutrophils Relative %: 69 %
Platelets: 232 10*3/uL (ref 150–400)
RBC: 3.58 MIL/uL — ABNORMAL LOW (ref 3.87–5.11)
RDW: 12.2 % (ref 11.5–15.5)
WBC: 7.3 10*3/uL (ref 4.0–10.5)
nRBC: 0 % (ref 0.0–0.2)

## 2018-06-03 LAB — BASIC METABOLIC PANEL
Anion gap: 9 (ref 5–15)
BUN: 35 mg/dL — ABNORMAL HIGH (ref 8–23)
CO2: 22 mmol/L (ref 22–32)
Calcium: 9.2 mg/dL (ref 8.9–10.3)
Chloride: 105 mmol/L (ref 98–111)
Creatinine, Ser: 1.67 mg/dL — ABNORMAL HIGH (ref 0.44–1.00)
GFR calc Af Amer: 36 mL/min — ABNORMAL LOW (ref >60)
GFR calc non Af Amer: 31 mL/min — ABNORMAL LOW (ref >60)
Glucose, Bld: 243 mg/dL — ABNORMAL HIGH (ref 70–99)
Potassium: 5.1 mmol/L (ref 3.5–5.1)
Sodium: 136 mmol/L (ref 135–145)

## 2018-06-03 MED ORDER — DIPHENHYDRAMINE HCL 50 MG/ML IJ SOLN
25.0000 mg | Freq: Once | INTRAMUSCULAR | Status: AC
  Administered 2018-06-03: 25 mg via INTRAVENOUS
  Filled 2018-06-03: qty 1

## 2018-06-03 MED ORDER — ONDANSETRON HCL 4 MG PO TABS: 4.0000 mg | ORAL_TABLET | Freq: Three times a day (TID) | ORAL | 0 refills | Status: AC | PRN

## 2018-06-03 MED ORDER — OXYCODONE-ACETAMINOPHEN 5-325 MG PO TABS
1.0000 | ORAL_TABLET | Freq: Once | ORAL | Status: AC
  Administered 2018-06-03: 1 via ORAL
  Filled 2018-06-03: qty 1

## 2018-06-03 MED ORDER — OXYCODONE-ACETAMINOPHEN 5-325 MG PO TABS: 1.0000 | ORAL_TABLET | Freq: Three times a day (TID) | ORAL | 0 refills | Status: AC | PRN

## 2018-06-03 MED ORDER — ONDANSETRON 8 MG PO TBDP
8.0000 mg | ORAL_TABLET | Freq: Once | ORAL | Status: AC
  Administered 2018-06-03: 8 mg via ORAL
  Filled 2018-06-03: qty 1

## 2018-06-03 MED ORDER — PREDNISONE 50 MG PO TABS: ORAL_TABLET | ORAL | 0 refills | Status: DC

## 2018-06-03 MED ORDER — DEXAMETHASONE SODIUM PHOSPHATE 10 MG/ML IJ SOLN
10.0000 mg | Freq: Once | INTRAMUSCULAR | Status: AC
  Administered 2018-06-03: 10 mg via INTRAVENOUS
  Filled 2018-06-03: qty 1

## 2018-06-03 MED ORDER — GADOBUTROL 1 MMOL/ML IV SOLN
8.0000 mL | Freq: Once | INTRAVENOUS | Status: AC | PRN
  Administered 2018-06-03: 8 mL via INTRAVENOUS
  Filled 2018-06-03: qty 8

## 2018-06-03 NOTE — ED Provider Notes (Signed)
[facility] Emergency Department Provider Note  ____________________________________________  Time seen: Approximately 4:55 PM  I have reviewed the triage vital signs and the nursing notes.   HISTORY  Chief Complaint Otalgia    HPI Dorothy Rogers is a 68 y.o. female presents to the emergency department with 10 out of 10 left-sided postauricular pain that started last night.  Patient describes the pain as sharp and stabbing and she localizes the pain to the postauricular space above the mastoid process.  She has had no changes in hearing or discharge from the ear.  She has noticed no rash or focal swelling behind the ear.  Patient reports that intensity of pain has made her nauseated.  She denies changes in vision or history of middle ear infections.  Patient states that she had bronchitis several weeks ago but has been otherwise well.   Past Medical History:  Diagnosis Date  . Anemia   . Anemia   . Arthritis    lower back, hips  . Blood transfusion without reported diagnosis   . CAD (coronary artery disease) FEB AND NOV 2009   6 STENTS  . 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 Cataract   . Chronic kidney disease    Stage III  . Coronary artery disease   . DDD (degenerative disc disease), lumbar   . Diabetes mellitus without complication ([place])    TYPE 2  . Herpes simplex   . Hyperlipidemia   . Hypertension    CONTROLLED ON MEDS  . Vertigo    occasional, no episodes in 2-3 months  . Vertigo    OCCASIONALLY  . Vitamin B 12 deficiency     Patient Active Problem List   Diagnosis Date Noted  . Bilateral sciatica 11/29/2016  . Gastroparesis due to DM ([place]) 11/26/2016  . Cholelithiasis   . Protein-calorie malnutrition, severe 05/18/2016  . Supraventricular tachycardia ([place]) 05/05/2016  . History of paroxysmal supraventricular tachycardia 05/05/2016  . SOB (shortness of breath)   . Near syncope 04/07/2016  . 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 Non-intractable cyclical vomiting with nausea   . Congenital esophageal defect   . Hiatal hernia   . Gastritis   . Diarrhea 07/10/2015  . Diabetes mellitus ([place]) 07/02/2015  . Adjustment disorder 06/16/2015  . Abdominal pain 06/16/2015  . C. difficile colitis 06/16/2015  . Intractable nausea and vomiting 06/09/2015  . Dehydration 06/09/2015  . Type 2 diabetes mellitus with diabetic neuropathy ([place]) 06/09/2015  . Hyperlipemia 06/09/2015  . Hypertension 06/09/2015  . CAD (coronary artery disease) 06/09/2015  . CKD (chronic kidney disease), stage III ([place]) 06/09/2015  . Thrombocytopenia ([place]) 05/30/2015  . Lumbar canal stenosis 05/30/2015  . Disc disease with myelopathy, lumbar 05/30/2015  . Presence of stent in coronary artery 05/30/2015  . 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 Status post lumbar spinal fusion 12/25/2014  . DDD (degenerative disc disease), lumbar 11/18/2014  . Lumbar post-laminectomy syndrome 11/18/2014  . Facet syndrome, lumbar 11/18/2014  . Piriformis syndrome of left side 11/18/2014  . Status post lumbar spine operation 05/09/2014  . Adiposity 04/23/2014  . Thoracic and lumbosacral neuritis 03/27/2013  . H/O arthrodesis 03/27/2013  . Peripheral vascular disease ([place]) 03/22/2013  . Hypomagnesemia 03/22/2013  . Absolute anemia 03/22/2013    Past Surgical History:  Procedure Laterality Date  . [place] STUDY N/A 02/04/2016   Procedure: [place] STUDY;  Surgeon: Lucilla Lame, MD;  Location: [facility] ENDOSCOPY;  Service:  Endoscopy;  Laterality: N/A;  . ABDOMINAL HYSTERECTOMY    . BACK SURGERY     Lumbar spinal fusion 05/30/2015  . CARDIAC CATHETERIZATION  2009  . CATARACT EXTRACTION Left   . CATARACT EXTRACTION W/PHACO Right 03/19/2015   Procedure: CATARACT EXTRACTION PHACO AND INTRAOCULAR LENS PLACEMENT ([place]);  Surgeon: Leandrew Koyanagi, MD;  Location: [place];  Service: Ophthalmology;  Laterality: Right;  DIABETIC -  insulin and oral meds  . CHOLECYSTECTOMY N/A 05/19/2016   Procedure: LAPAROSCOPIC CHOLECYSTECTOMY;  Surgeon: Clayburn Pert, MD;  Location: [facility] ORS;  Service: General;  Laterality: N/A;  . CORONARY STENT PLACEMENT  03/2008  . CYSTECTOMY Right    wrist  . ESOPHAGEAL MANOMETRY N/A 02/04/2016   Procedure: ESOPHAGEAL MANOMETRY (EM);  Surgeon: Lucilla Lame, MD;  Location: [facility] ENDOSCOPY;  Service: Endoscopy;  Laterality: N/A;  . ESOPHAGOGASTRODUODENOSCOPY (EGD) WITH PROPOFOL N/A 09/04/2015   Procedure: ESOPHAGOGASTRODUODENOSCOPY (EGD) WITH PROPOFOL;  Surgeon: Lucilla Lame, MD;  Location: [place];  Service: Endoscopy;  Laterality: N/A;  INSULIN DEPENDENT DIABETIC  . ESOPHAGOGASTRODUODENOSCOPY (EGD) WITH PROPOFOL N/A 06/29/2016   Procedure: ESOPHAGOGASTRODUODENOSCOPY (EGD) WITH PROPOFOL;  Surgeon: Jonathon Bellows, MD;  Location: [facility] ENDOSCOPY;  Service: Endoscopy;  Laterality: N/A;  . EYE SURGERY Left    cataract extraction  . LEFT HEART CATH AND CORONARY ANGIOGRAPHY Left 08/18/2017   Procedure: LEFT HEART CATH AND CORONARY ANGIOGRAPHY;  Surgeon: Teodoro Spray, MD;  Location: [place] CV [facility];  Service: Cardiovascular;  Laterality: Left;  . LUMBAR FUSION  2001, 2014, 2015  . [place]     2001, 2014, 2015  . TUMOR EXCISION     back of neck    Prior to Admission medications   Medication Sig Start Date End Date Taking? Authorizing Provider  acetaminophen (TYLENOL) 500 MG tablet Take 1,000 mg by mouth every 6 (six) hours as needed for mild pain or moderate pain.     [provider]  acyclovir (ZOVIRAX) 400 MG tablet Take 400 mg by mouth daily as needed (for fever blisters).  08/07/15   [provider]  aspirin EC 81 MG EC tablet Take 1 tablet (81 mg total) by mouth daily. 05/21/16   Nicholes Mango, MD  atorvastatin (LIPITOR) 40 MG tablet Take 40 mg by mouth at bedtime.  08/16/14   [provider]  Biotin 10 MG TABS Take 10 mg by mouth daily.     [provider]  Calcium Carb-Cholecalciferol 600-800 MG-UNIT CHEW Chew 1 each by mouth daily.     [provider]  CARTIA XT 120 MG 24 hr capsule Take 120 mg by mouth daily. 12/13/17   [provider]  clopidogrel (PLAVIX) 75 MG tablet Take 75 mg by mouth daily.    [provider]  ferrous sulfate 325 (65 FE) MG EC tablet Take 325 mg by mouth daily.     [provider]  glipiZIDE (GLUCOTROL) 10 MG tablet Take 10 mg by mouth 2 (two) times daily.    [provider]  HUMALOG KWIKPEN 100 UNIT/ML KiwkPen Inject into the skin 3 (three) times daily as needed (Per sliding scale).  03/02/17   [provider]  insulin glargine (LANTUS) 100 UNIT/ML injection Inject 32 Units into the skin daily. Noon    [provider]  LORazepam (ATIVAN) 0.5 MG tablet Take 0.5 mg by mouth 2 (two) times daily.    [provider]  losartan (COZAAR) 100 MG tablet Take 100 mg by mouth daily at 12  noon.  02/05/14   [provider]  ondansetron (ZOFRAN ODT) 8 MG disintegrating tablet Take 8 mg by mouth as needed for nausea or vomiting.    [provider]  ondansetron (ZOFRAN) 4 MG tablet Take 1 tablet (4 mg total) by mouth every 8 (eight) hours as needed for up to 3 days for nausea or vomiting. 06/03/18 06/06/18  Lannie Fields, PA-C  oxyCODONE-acetaminophen (PERCOCET/ROXICET) 5-325 MG tablet Take 1 tablet by mouth every 8 (eight) hours as needed for up to 3 days for severe pain. 06/03/18 06/06/18  Lannie Fields, PA-C  predniSONE (DELTASONE) 50 MG tablet Take one 50 mg tablet once daily for the next five days. 06/03/18   Lannie Fields, PA-C  Probiotic Product (4X PROBIOTIC PO) Take 1 capsule by mouth daily.     [provider]  vitamin B-12 (CYANOCOBALAMIN) 1000 MCG tablet Take 1,000 mcg by mouth daily.    [provider]    Allergies Tramadol; Beta adrenergic blockers; Celebrex [celecoxib]; Gabapentin; Niacin and related; Oxycodone;  Shellfish allergy; Vicodin [hydrocodone-acetaminophen]; and Heparin  Family History  Problem Relation Age of Onset  . Heart disease Father   . Diabetes Other   . Breast cancer Neg Hx     Social History Social History   Tobacco Use  . Smoking status: Never Smoker  . Smokeless tobacco: Never Used  Substance Use Topics  . Alcohol use: Yes    Alcohol/week: 0.0 standard drinks    Comment: 2-3 drinks per year  . Drug use: No     Review of Systems  Constitutional: No fever/chills Eyes: No visual changes. No discharge ENT: Patient has left sided post auricular ear pain.  Cardiovascular: no chest pain. Respiratory: no cough. No SOB. Gastrointestinal: No abdominal pain.  No nausea, no vomiting.  No diarrhea.  No constipation. Genitourinary: Negative for dysuria. No hematuria. Musculoskeletal: Negative for musculoskeletal pain. Skin: Negative for rash, abrasions, lacerations, ecchymosis. Neurological: Negative for headaches, focal weakness or numbness.   ____________________________________________   PHYSICAL EXAM:  VITAL SIGNS: ED Triage Vitals  Enc Vitals Group     BP 06/03/18 1544 (!) 158/88     Pulse Rate 06/03/18 1544 (!) 108     Resp 06/03/18 1544 18     Temp 06/03/18 1544 98.3 F (36.8 C)     Temp Source 06/03/18 1544 Oral     SpO2 06/03/18 1544 100 %     Weight 06/03/18 1545 185 lb (83.9 kg)     Height 06/03/18 1545 5\' 2"  (1.575 m)     Head Circumference --      Peak Flow --      Pain Score 06/03/18 1544 8     Pain Loc --      Pain Edu? --      Excl. in [place]? --      Constitutional: Alert and oriented. Well appearing and in no acute distress. Eyes: Conjunctivae are normal. PERRL. EOMI. Head: Atraumatic. ENT:      Ears: TMs are pearly.  No erythema behind the left external auditory canal or palpable lymph nodes.  No tenderness over the mastoid process.      Nose: No congestion/rhinnorhea.      Mouth/Throat: Mucous membranes are moist.  Neck: No stridor.   No cervical spine tenderness to palpation. Hematological/Lymphatic/Immunilogical: No cervical lymphadenopathy.  Cardiovascular: Normal rate, regular rhythm. Normal S1 and S2.  Good peripheral circulation. Respiratory: Normal respiratory effort without tachypnea or retractions. Lungs CTAB. Good air  entry to the bases with no decreased or absent breath sounds. Gastrointestinal: Bowel sounds 4 quadrants. Soft and nontender to palpation. No guarding or rigidity. No palpable masses. No distention. No CVA tenderness. Musculoskeletal: Full range of motion to all extremities. No gross deformities appreciated. Neurologic:  Normal speech and language. No gross focal neurologic deficits are appreciated.  Skin:  Skin is warm, dry and intact. No rash noted. Psychiatric: Mood and affect are normal. Speech and behavior are normal. Patient exhibits appropriate insight and judgement.   ____________________________________________   LABS (all labs ordered are listed, but only abnormal results are displayed)  Labs Reviewed  CBC WITH DIFFERENTIAL/PLATELET - Abnormal; Notable for the following components:      Result Value   RBC 3.58 (*)    Hemoglobin 11.1 (*)    HCT 33.6 (*)    All other components within normal limits  BASIC METABOLIC PANEL - Abnormal; Notable for the following components:   Glucose, Bld 243 (*)    BUN 35 (*)    Creatinine, Ser 1.67 (*)    GFR calc non Af Amer 31 (*)    GFR calc Af Amer 36 (*)    All other components within normal limits   ____________________________________________  EKG   ____________________________________________  RADIOLOGY I personally viewed and evaluated these images as part of my medical decision making, as well as reviewing the written report by the radiologist.    Mr Jodene Nam Head Wo Contrast  Result Date: 06/03/2018 CLINICAL DATA:  68 y/o F; Headache, acute, normal neuro exam Left sided post auricular headache. EXAM: MRI HEAD WITHOUT AND WITH  CONTRAST MRA HEAD WITHOUT CONTRAST TECHNIQUE: Multiplanar, multiecho pulse sequences of the brain and surrounding structures were obtained without and with intravenous contrast. Angiographic images of the head were obtained using MRA technique without contrast. CONTRAST:  8 cc Gadavist COMPARISON:  02/24/2017 MRI of the head. FINDINGS: MRI HEAD FINDINGS Brain: No acute infarction, hemorrhage, hydrocephalus, extra-axial collection or mass lesion. Stable single punctate focus of susceptibility hypointensity within the frontal white matter without signal correlate on other sequences compatible with hemosiderin deposition of chronic microhemorrhage. Few stable nonspecific punctate foci of T2 FLAIR hyperintensity are present in bifrontal subcortical white matter. After administration of intravenous contrast there is no abnormal enhancement. Vascular: Normal flow voids. Skull and upper cervical spine: Normal marrow signal. Sinuses/Orbits: Negative. Specifically no abnormal signal of the left mastoid air cells. Other: Bilateral intra-ocular lens replacement. MRA HEAD FINDINGS Internal carotid arteries: Patent. Irregularity of the bilateral carotid siphons compatible with atherosclerosis with moderate right and mild left paraclinoid stenosis. Anterior cerebral arteries:  Patent. Middle cerebral arteries: Patent. Anterior communicating artery: Patent. Posterior communicating arteries:  Patent. Posterior cerebral arteries:  Patent. Basilar artery:  Patent. Vertebral arteries:  Patent. No additional segment of high-grade stenosis, large vessel occlusion, or aneurysm. IMPRESSION: 1. No acute intracranial process or abnormal enhancement. 2. Patent anterior and posterior intracranial circulation. No large vessel occlusion or aneurysm. 3. Stable few nonspecific bifrontal white matter hyperintensities probably represent mild chronic microvascular ischemic changes or related to migraine. 4. Atherosclerosis of the carotid siphons  with moderate right and mild left paraclinoid ICA stenosis. Electronically Signed   By: Kristine Garbe M.D.   On: 06/03/2018 19:16   Mr Jeri Cos And Wo Contrast  Result Date: 06/03/2018 CLINICAL DATA:  68 y/o F; Headache, acute, normal neuro exam Left sided post auricular headache. EXAM: MRI HEAD WITHOUT AND WITH CONTRAST MRA HEAD WITHOUT CONTRAST TECHNIQUE: Multiplanar, multiecho pulse  sequences of the brain and surrounding structures were obtained without and with intravenous contrast. Angiographic images of the head were obtained using MRA technique without contrast. CONTRAST:  8 cc Gadavist COMPARISON:  02/24/2017 MRI of the head. FINDINGS: MRI HEAD FINDINGS Brain: No acute infarction, hemorrhage, hydrocephalus, extra-axial collection or mass lesion. Stable single punctate focus of susceptibility hypointensity within the frontal white matter without signal correlate on other sequences compatible with hemosiderin deposition of chronic microhemorrhage. Few stable nonspecific punctate foci of T2 FLAIR hyperintensity are present in bifrontal subcortical white matter. After administration of intravenous contrast there is no abnormal enhancement. Vascular: Normal flow voids. Skull and upper cervical spine: Normal marrow signal. Sinuses/Orbits: Negative. Specifically no abnormal signal of the left mastoid air cells. Other: Bilateral intra-ocular lens replacement. MRA HEAD FINDINGS Internal carotid arteries: Patent. Irregularity of the bilateral carotid siphons compatible with atherosclerosis with moderate right and mild left paraclinoid stenosis. Anterior cerebral arteries:  Patent. Middle cerebral arteries: Patent. Anterior communicating artery: Patent. Posterior communicating arteries:  Patent. Posterior cerebral arteries:  Patent. Basilar artery:  Patent. Vertebral arteries:  Patent. No additional segment of high-grade stenosis, large vessel occlusion, or aneurysm. IMPRESSION: 1. No acute intracranial  process or abnormal enhancement. 2. Patent anterior and posterior intracranial circulation. No large vessel occlusion or aneurysm. 3. Stable few nonspecific bifrontal white matter hyperintensities probably represent mild chronic microvascular ischemic changes or related to migraine. 4. Atherosclerosis of the carotid siphons with moderate right and mild left paraclinoid ICA stenosis. Electronically Signed   By: Kristine Garbe M.D.   On: 06/03/2018 19:16    ____________________________________________    PROCEDURES  Procedure(s) performed:    Procedures    Medications  oxyCODONE-acetaminophen (PERCOCET/ROXICET) 5-325 MG per tablet 1 tablet (1 tablet Oral Given 06/03/18 1700)  ondansetron (ZOFRAN-ODT) disintegrating tablet 8 mg (8 mg Oral Given 06/03/18 1700)  gadobutrol (GADAVIST) 1 MMOL/ML injection 8 mL (8 mLs Intravenous Contrast Given 06/03/18 1830)  dexamethasone (DECADRON) injection 10 mg (10 mg Intravenous Given 06/03/18 1936)  diphenhydrAMINE (BENADRYL) injection 25 mg (25 mg Intravenous Given 06/03/18 1938)     ____________________________________________   INITIAL IMPRESSION / ASSESSMENT AND PLAN / ED COURSE  Pertinent labs & imaging results that were available during my care of the patient were reviewed by me and considered in my medical decision making (see chart for details).  Review of the [place] CSRS was performed in accordance of the [place] prior to dispensing any controlled drugs.      ----------------------------------------- 5:19 PM on 06/03/2018 -----------------------------------------  Discussed patient's case with radiologist, Dr. Toney Reil.  Given history and physical exam findings, we will start with MRI with and without contrast and MRA.    Assessment and plan:  Neuralgia  Differential diagnosis includes glossopharyngeal neuralgia and secondary causes of glossopharyngeal neuralgia such as demyelinating lesions, cerebellopontine angle tumor, carotid  aneurysm.  Patient presented to the emergency department with left-sided temporal pain that was sharp and stabbing in nature.  Radiologist on-call was consulted and MRI and MRA were recommended, Dr. Marcellina Millin.  MRA and MRI of the brain revealed no acute abnormality.  Patient's pain improved in the emergency department with Percocet, Decadron and Benadryl.  Patient was discharged with Percocet and prednisone.  Strict return precautions were given to return to the emergency department with new or worsening symptoms.  Patient was advised to make follow-up appointment with primary care in 1 week.  All patient questions were answered.   ____________________________________________  FINAL CLINICAL IMPRESSION(S) / ED DIAGNOSES  Final  diagnoses:  Neuralgia      NEW MEDICATIONS STARTED DURING THIS VISIT:  ED Discharge Orders         Ordered    predniSONE (DELTASONE) 50 MG tablet     06/03/18 2023    oxyCODONE-acetaminophen (PERCOCET/ROXICET) 5-325 MG tablet  Every 8 hours PRN     06/03/18 2023    ondansetron (ZOFRAN) 4 MG tablet  Every 8 hours PRN     06/03/18 2023              This chart was dictated using voice recognition software/Dragon. Despite best efforts to proofread, errors can occur which can change the meaning. Any change was purely unintentional.    Karren Cobble 06/03/18 2033    Nance Pear, MD 06/03/18 2055

## 2018-06-03 NOTE — ED Notes (Signed)
Pt having stabbing pains to mastoid bone on left side. No hearing change, no pain in ear. No fever. Started last night went away, then came back today.

## 2018-06-03 NOTE — ED Triage Notes (Signed)
Pain behind L ear since yesterday. Denies cough, congestion, or fever.   A&O, ambulatory. No distress noted.

## 2018-07-27 ENCOUNTER — Encounter: Payer: Self-pay | Admitting: Emergency Medicine

## 2018-07-27 ENCOUNTER — Other Ambulatory Visit: Payer: Self-pay

## 2018-07-27 ENCOUNTER — Emergency Department
Admission: EM | Admit: 2018-07-27 | Discharge: 2018-07-28 | Disposition: A | Discharge status: Home / Self Care | Payer: Medicare Other | Attending: Emergency Medicine | Admitting: Emergency Medicine

## 2018-07-27 DIAGNOSIS — Z79899 Other long term (current) drug therapy: Secondary | ICD-10-CM | POA: Diagnosis not present

## 2018-07-27 DIAGNOSIS — Z955 Presence of coronary angioplasty implant and graft: Secondary | ICD-10-CM | POA: Diagnosis not present

## 2018-07-27 DIAGNOSIS — Z85828 Personal history of other malignant neoplasm of skin: Secondary | ICD-10-CM | POA: Diagnosis not present

## 2018-07-27 DIAGNOSIS — N183 Chronic kidney disease, stage 3 (moderate): Secondary | ICD-10-CM | POA: Insufficient documentation

## 2018-07-27 DIAGNOSIS — E1140 Type 2 diabetes mellitus with diabetic neuropathy, unspecified: Secondary | ICD-10-CM | POA: Diagnosis not present

## 2018-07-27 DIAGNOSIS — R51 Headache: Secondary | ICD-10-CM | POA: Diagnosis present

## 2018-07-27 DIAGNOSIS — I2510 Atherosclerotic heart disease of native coronary artery without angina pectoris: Secondary | ICD-10-CM | POA: Insufficient documentation

## 2018-07-27 DIAGNOSIS — Z794 Long term (current) use of insulin: Secondary | ICD-10-CM | POA: Diagnosis not present

## 2018-07-27 DIAGNOSIS — R110 Nausea: Secondary | ICD-10-CM | POA: Insufficient documentation

## 2018-07-27 DIAGNOSIS — I129 Hypertensive chronic kidney disease with stage 1 through stage 4 chronic kidney disease, or unspecified chronic kidney disease: Secondary | ICD-10-CM | POA: Diagnosis not present

## 2018-07-27 DIAGNOSIS — M792 Neuralgia and neuritis, unspecified: Secondary | ICD-10-CM | POA: Diagnosis not present

## 2018-07-27 DIAGNOSIS — E1122 Type 2 diabetes mellitus with diabetic chronic kidney disease: Secondary | ICD-10-CM | POA: Diagnosis not present

## 2018-07-27 MED ORDER — DEXAMETHASONE SODIUM PHOSPHATE 10 MG/ML IJ SOLN
10.0000 mg | Freq: Once | INTRAMUSCULAR | Status: AC
  Administered 2018-07-27: 10 mg via INTRAVENOUS
  Filled 2018-07-27: qty 1

## 2018-07-27 MED ORDER — KETOROLAC TROMETHAMINE 30 MG/ML IJ SOLN
30.0000 mg | Freq: Once | INTRAMUSCULAR | Status: AC
  Administered 2018-07-27: 30 mg via INTRAVENOUS
  Filled 2018-07-27: qty 1

## 2018-07-27 MED ORDER — DIPHENHYDRAMINE HCL 50 MG/ML IJ SOLN
25.0000 mg | Freq: Once | INTRAMUSCULAR | Status: AC
  Administered 2018-07-27: 25 mg via INTRAVENOUS
  Filled 2018-07-27: qty 1

## 2018-07-27 MED ORDER — PREDNISONE 20 MG PO TABS: 20.0000 mg | ORAL_TABLET | Freq: Every day | ORAL | 0 refills | Status: AC

## 2018-07-27 MED ORDER — OXYCODONE-ACETAMINOPHEN 5-325 MG PO TABS
1.0000 | ORAL_TABLET | Freq: Once | ORAL | Status: AC
  Administered 2018-07-27: 1 via ORAL
  Filled 2018-07-27: qty 1

## 2018-07-27 NOTE — ED Provider Notes (Signed)
[facility] Emergency Department Provider Note ____________________________________________   First MD Initiated Contact with Patient 07/27/18 2112     (approximate)  I have reviewed the triage vital signs and the nursing notes.   HISTORY  Chief Complaint Headache    HPI AIZLYNN DIGILIO is a 68 y.o. female with PMH as noted below who presents with pain to the left side of her head, acute onset yesterday morning and persisting since then, and described as a sharp shooting pain that occurs as often as every few seconds although sometimes it is a few minutes apart.  It radiates towards her left ear.  It is was associated with some nausea but no vomiting.  She has no associated photophobia no pain to the rest of the head.  She has had no trauma.  She states the pain is identical to symptoms she came to the hospital with last month.  Past Medical History:  Diagnosis Date  . Anemia   . Anemia   . Arthritis    lower back, hips  . Blood transfusion without reported diagnosis   . CAD (coronary artery disease) FEB AND NOV 2009   6 STENTS  . 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 Cataract   . Chronic kidney disease    Stage III  . Coronary artery disease   . DDD (degenerative disc disease), lumbar   . Diabetes mellitus without complication ([place])    TYPE 2  . Herpes simplex   . Hyperlipidemia   . Hypertension    CONTROLLED ON MEDS  . Vertigo    occasional, no episodes in 2-3 months  . Vertigo    OCCASIONALLY  . Vitamin B 12 deficiency     Patient Active Problem List   Diagnosis Date Noted  . Bilateral sciatica 11/29/2016  . Gastroparesis due to DM ([place]) 11/26/2016  . Cholelithiasis   . Protein-calorie malnutrition, severe 05/18/2016  . Supraventricular tachycardia ([place]) 05/05/2016  . History of paroxysmal supraventricular tachycardia 05/05/2016  . SOB (shortness of breath)   . Near syncope 04/07/2016  . Paraesophageal hernia  03/11/2016  . Non-intractable cyclical vomiting with nausea   . Congenital esophageal defect   . Hiatal hernia   . Gastritis   . Diarrhea 07/10/2015  . Diabetes mellitus ([place]) 07/02/2015  . Adjustment disorder 06/16/2015  . Abdominal pain 06/16/2015  . C. difficile colitis 06/16/2015  . Intractable nausea and vomiting 06/09/2015  . Dehydration 06/09/2015  . Type 2 diabetes mellitus with diabetic neuropathy ([place]) 06/09/2015  . Hyperlipemia 06/09/2015  . Hypertension 06/09/2015  . CAD (coronary artery disease) 06/09/2015  . CKD (chronic kidney disease), stage III ([place]) 06/09/2015  . Thrombocytopenia ([place]) 05/30/2015  . Lumbar canal stenosis 05/30/2015  . Disc disease with myelopathy, lumbar 05/30/2015  . Presence of stent in coronary artery 05/30/2015  . 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 Status post lumbar spinal fusion 12/25/2014  . DDD (degenerative disc disease), lumbar 11/18/2014  . Lumbar post-laminectomy syndrome 11/18/2014  . Facet syndrome, lumbar 11/18/2014  . Piriformis syndrome of left side 11/18/2014  . Status post lumbar spine operation 05/09/2014  . Adiposity 04/23/2014  . Thoracic and lumbosacral neuritis 03/27/2013  . H/O arthrodesis 03/27/2013  . Peripheral vascular disease ([place]) 03/22/2013  . Hypomagnesemia 03/22/2013  . Absolute anemia 03/22/2013    Past Surgical History:  Procedure Laterality Date  . [place] STUDY N/A 02/04/2016   Procedure: [place] STUDY;  Surgeon: Evangeline Gula  Allen Norris, MD;  Location: [facility];  Service: Endoscopy;  Laterality: N/A;  . ABDOMINAL HYSTERECTOMY    . BACK SURGERY     Lumbar spinal fusion 05/30/2015  . CARDIAC CATHETERIZATION  2009  . CATARACT EXTRACTION Left   . CATARACT EXTRACTION W/PHACO Right 03/19/2015   Procedure: CATARACT EXTRACTION PHACO AND INTRAOCULAR LENS PLACEMENT ([place]);  Surgeon: Leandrew Koyanagi, MD;  Location: [facility];  Service: Ophthalmology;  Laterality: Right;  DIABETIC - insulin  and oral meds  . CHOLECYSTECTOMY N/A 05/19/2016   Procedure: LAPAROSCOPIC CHOLECYSTECTOMY;  Surgeon: Clayburn Pert, MD;  Location: [facility] ORS;  Service: General;  Laterality: N/A;  . CORONARY STENT PLACEMENT  03/2008  . CYSTECTOMY Right    wrist  . ESOPHAGEAL MANOMETRY N/A 02/04/2016   Procedure: ESOPHAGEAL MANOMETRY (EM);  Surgeon: Lucilla Lame, MD;  Location: [facility] ENDOSCOPY;  Service: Endoscopy;  Laterality: N/A;  . ESOPHAGOGASTRODUODENOSCOPY (EGD) WITH PROPOFOL N/A 09/04/2015   Procedure: ESOPHAGOGASTRODUODENOSCOPY (EGD) WITH PROPOFOL;  Surgeon: Lucilla Lame, MD;  Location: [facility];  Service: Endoscopy;  Laterality: N/A;  INSULIN DEPENDENT DIABETIC  . ESOPHAGOGASTRODUODENOSCOPY (EGD) WITH PROPOFOL N/A 06/29/2016   Procedure: ESOPHAGOGASTRODUODENOSCOPY (EGD) WITH PROPOFOL;  Surgeon: Jonathon Bellows, MD;  Location: [facility] ENDOSCOPY;  Service: Endoscopy;  Laterality: N/A;  . EYE SURGERY Left    cataract extraction  . LEFT HEART CATH AND CORONARY ANGIOGRAPHY Left 08/18/2017   Procedure: LEFT HEART CATH AND CORONARY ANGIOGRAPHY;  Surgeon: Teodoro Spray, MD;  Location: [place] CV LAB;  Service: Cardiovascular;  Laterality: Left;  . LUMBAR FUSION  2001, 2014, 2015  . [place]     2001, 2014, 2015  . TUMOR EXCISION     back of neck    Prior to Admission medications   Medication Sig Start Date End Date Taking? Authorizing Provider  acetaminophen (TYLENOL) 500 MG tablet Take 1,000 mg by mouth every 6 (six) hours as needed for mild pain or moderate pain.     [provider]  acyclovir (ZOVIRAX) 400 MG tablet Take 400 mg by mouth daily as needed (for fever blisters).  08/07/15   [provider]  aspirin EC 81 MG EC tablet Take 1 tablet (81 mg total) by mouth daily. 05/21/16   Nicholes Mango, MD  atorvastatin (LIPITOR) 40 MG tablet Take 40 mg by mouth at bedtime.  08/16/14   [provider]  Biotin 10 MG TABS Take 10 mg by mouth daily.     [provider]    Calcium Carb-Cholecalciferol 600-800 MG-UNIT CHEW Chew 1 each by mouth daily.     [provider]  CARTIA XT 120 MG 24 hr capsule Take 120 mg by mouth daily. 12/13/17   [provider]  clopidogrel (PLAVIX) 75 MG tablet Take 75 mg by mouth daily.    [provider]  ferrous sulfate 325 (65 FE) MG EC tablet Take 325 mg by mouth daily.     [provider]  glipiZIDE (GLUCOTROL) 10 MG tablet Take 10 mg by mouth 2 (two) times daily.    [provider]  HUMALOG KWIKPEN 100 UNIT/ML KiwkPen Inject into the skin 3 (three) times daily as needed (Per sliding scale).  03/02/17   [provider]  insulin glargine (LANTUS) 100 UNIT/ML injection Inject 32 Units into the skin daily. Noon    [provider]  LORazepam (ATIVAN) 0.5 MG tablet Take 0.5 mg by mouth 2 (two) times daily.    [provider]  losartan (COZAAR) 100 MG  tablet Take 100 mg by mouth daily at 12 noon.  02/05/14   [provider]  ondansetron (ZOFRAN ODT) 8 MG disintegrating tablet Take 8 mg by mouth as needed for nausea or vomiting.    [provider]  predniSONE (DELTASONE) 20 MG tablet Take 1 tablet (20 mg total) by mouth daily for 3 days. 07/27/18 07/30/18  Arta Silence, MD  Probiotic Product (4X PROBIOTIC PO) Take 1 capsule by mouth daily.     [provider]  vitamin B-12 (CYANOCOBALAMIN) 1000 MCG tablet Take 1,000 mcg by mouth daily.    [provider]    Allergies Tramadol; Beta adrenergic blockers; Celebrex [celecoxib]; Gabapentin; Niacin and related; Oxycodone; Shellfish allergy; Vicodin [hydrocodone-acetaminophen]; and Heparin  Family History  Problem Relation Age of Onset  . Heart disease Father   . Diabetes Other   . Breast cancer Neg Hx     Social History Social History   Tobacco Use  . Smoking status: Never Smoker  . Smokeless tobacco: Never Used  Substance Use Topics  . Alcohol use: Yes    Alcohol/week: 0.0  standard drinks    Comment: 2-3 drinks per year  . Drug use: No    Review of Systems  Constitutional: No fever. Eyes: No visual changes. ENT: No neck pain. Cardiovascular: Denies chest pain. Respiratory: Denies shortness of breath. Gastrointestinal: No vomiting.  Genitourinary: Negative for flank pain.  Musculoskeletal: Negative for back pain. Skin: Negative for rash. Neurological: Negative for focal weakness or numbness.   ____________________________________________   PHYSICAL EXAM:  VITAL SIGNS: ED Triage Vitals  Enc Vitals Group     BP 07/27/18 1411 135/67     Pulse Rate 07/27/18 1411 (!) 103     Resp 07/27/18 1411 16     Temp 07/27/18 1409 98.2 F (36.8 C)     Temp Source 07/27/18 1409 Oral     SpO2 07/27/18 1411 100 %     Weight 07/27/18 1410 186 lb (84.4 kg)     Height 07/27/18 1410 5\' 2"  (1.575 m)     Head Circumference --      Peak Flow --      Pain Score 07/27/18 1409 8     Pain Loc --      Pain Edu? --      Excl. in [place]? --     Constitutional: Alert and oriented.  Relatively well appearing and in no acute distress. Eyes: Conjunctivae are normal.  EOMI.  PERRLA. Head: Atraumatic.  Scalp nontender. Nose: No congestion/rhinnorhea. Mouth/Throat: Mucous membranes are moist.   Neck: Normal range of motion.  Supple with no meningeal signs. Cardiovascular: Good peripheral circulation. Respiratory: Normal respiratory effort.  Gastrointestinal: No distention.  Musculoskeletal: Extremities warm and well perfused.  Neurologic:  Normal speech and language.  Motor and sensory intact in all extremities.  Normal coordination with no ataxia on finger-to-nose.  No gross focal neurologic deficits are appreciated.  Skin:  Skin is warm and dry. No rash noted. Psychiatric: Mood and affect are normal. Speech and behavior are normal.  ____________________________________________   LABS (all labs ordered are listed, but only abnormal results are displayed)  Labs  Reviewed - No data to display ____________________________________________  EKG   ____________________________________________  RADIOLOGY    ____________________________________________   PROCEDURES  Procedure(s) performed: No  Procedures  Critical Care performed: No ____________________________________________   INITIAL IMPRESSION / ASSESSMENT AND PLAN / ED COURSE  Pertinent labs & imaging results that were available during my care of  the patient were reviewed by me and considered in my medical decision making (see chart for details).  68 year old female with PMH as noted above presents with pain behind her left ear which started yesterday and is described as sharp shooting pains initially associated with some nausea.  They occur anywhere from every few seconds to every few minutes.  She has no visual changes, scotoma, or any other acute neurologic symptoms.  She reports identical symptoms last month for which she came to the ED.  I reviewed the past medical records in [place].  The patient was seen in the ED on 06/03/2018 with a similar presentation.  Neurology was consulted.  The patient had an MRI which showed no significant acute findings.  On exam today, the patient is well-appearing and her vital signs are normal except for slight hypertension and tachycardia initially, likely related to discomfort.  She does appear slightly anxious.  The remainder of the exam is unremarkable.  Neuro exam is nonfocal.  The pain is not reproducible to palpation.  Overall the presentation is consistent with some type of neuralgia or atypical migraine.  There is no indication for imaging today or further work-up.  The patient did well with Decadron and Benadryl last time (she states she has reacted poorly to Reglan in the past) so I will order a dose of the same and reassess.  ----------------------------------------- 11:25 PM on 07/27/2018 -----------------------------------------  The  patient did not get much relief from the dexamethasone and Benadryl yet.  I added on Percocet and Toradol.  I am signing the patient out to the oncoming physician Dr. Karma Greaser.  The plan will be discharged home when her pain improves.  The patient agrees with this.  Return precautions given, and she expresses understanding. ____________________________________________   FINAL CLINICAL IMPRESSION(S) / ED DIAGNOSES  Final diagnoses:  Neuralgia      NEW MEDICATIONS STARTED DURING THIS VISIT:  New Prescriptions   PREDNISONE (DELTASONE) 20 MG TABLET    Take 1 tablet (20 mg total) by mouth daily for 3 days.     Note:  This document was prepared using Dragon voice recognition software and may include unintentional dictation errors.    Arta Silence, MD 07/27/18 2326

## 2018-07-27 NOTE — ED Notes (Signed)
Discussed patient with MD.  No new orders at this time.

## 2018-07-27 NOTE — ED Triage Notes (Signed)
Here for sharp stabbing pains to left side of head. Hx of same with negative work up. Some nausea.  Started yesterday. No relief with OTC meds.  Unlabored. VSS.  Pt reports pains are sharp and sometimes a few seconds apart sometimes will be a few minutes in between pains..  No vision changes.

## 2018-07-27 NOTE — ED Triage Notes (Signed)
No protocols per dr Joni Fears

## 2018-07-27 NOTE — Discharge Instructions (Signed)
Take the prednisone daily for 3 days starting tomorrow afternoon.  You can take the oxycodone that you already have as needed for breakthrough pain.  Return to the ER for new, worsening, or persistent severe pain, vision changes, lightheadedness, vomiting, fever, or any other new or worsening symptoms that concern you.

## 2018-07-27 NOTE — ED Provider Notes (Signed)
-----------------------------------------   11:30 PM on 07/27/2018 -----------------------------------------   Assuming care from Dr. Cherylann Banas.  In short, Dorothy Rogers is a 68 y.o. female with a chief complaint of headache.  Refer to the original H&P for additional details.  The current plan of care is to reassess pain control.   ----------------------------------------- 12:25 AM on 07/28/2018 -----------------------------------------  The patient's family member came out and said that the patient is ready to go.  I will discharge as per Dr. Kennith Gain recommendations.   Hinda Kehr, MD 07/28/18 [name]

## 2018-11-15 ENCOUNTER — Encounter: Payer: Self-pay | Admitting: General Surgery

## 2018-11-15 ENCOUNTER — Encounter: Payer: Medicare Other | Admitting: General Surgery

## 2018-11-15 ENCOUNTER — Other Ambulatory Visit: Payer: Self-pay

## 2018-11-15 NOTE — Patient Instructions (Signed)
Follow up with Dr. Dahlia Byes as scheduled for 11-29-18.

## 2018-11-18 ENCOUNTER — Emergency Department
Admission: EM | Admit: 2018-11-18 | Discharge: 2018-11-18 | Discharge status: Against Medical Advice | Payer: Medicare Other

## 2018-11-18 NOTE — ED Notes (Addendum)
Pt stating if she can go home. RN informed her that I could not advise her to go home. Pt states that she did not want to wait to be seen. Pt is in NAD at this time. Signed out AMA before triage.

## 2018-11-18 NOTE — ED Notes (Signed)
First Nurse Note: Pt to ED via [facility] from home for "sick call." Pt is c/o nausea and dizzy. Per EMS is hypertension. Pt is in NAD

## 2018-11-29 ENCOUNTER — Ambulatory Visit (INDEPENDENT_AMBULATORY_CARE_PROVIDER_SITE_OTHER): Payer: Medicare Other | Admitting: Surgery

## 2018-11-29 ENCOUNTER — Other Ambulatory Visit: Payer: Self-pay

## 2018-11-29 ENCOUNTER — Encounter: Payer: Self-pay | Admitting: Surgery

## 2018-11-29 VITALS — BP 152/81 | HR 120 | Temp 97.5°F | Resp 16 | Ht 62.5 in | Wt 186.6 lb

## 2018-11-29 DIAGNOSIS — K449 Diaphragmatic hernia without obstruction or gangrene: Secondary | ICD-10-CM | POA: Diagnosis not present

## 2018-11-29 NOTE — Progress Notes (Signed)
Patient ID: Dorothy Rogers, female   DOB: 18-Sep-1950, 68 y.o.   MRN: [id number]  HPI Dorothy Rogers is a 68 y.o. female in consultation at the request of Dr. Doy Hutching for hiatal hernia.  He did have a hiatal hernia more than 2 1/2 years ago by Dr. Gwyndolyn Saxon.  She reports that at that time she was having some dyspnea on exertion that improved after her repair and Nissen fundoplication.  She consequently had a cholecystectomy 2 months following the hiatal hernia without complications.  She was doing well for about a year and a half to close to 2 years and now started having the same symptoms again.  She dyspnea mainly on exertion after movement of the upper extremity.  She denies any reflux.  She does report some dysphagia for solids.  She is able to take good p.o.  No fevers no chills.  She is able to perform more than 4 METS of activity without any shortness of breath or chest pain.  She has had cardiac work-up that so far has been negative.  Dr. Ubaldo Glassing and she also has had a left calf revealing patent stents and no progression of her disease.  Also had cardiac MRI showing no ischemia.  She was seen by pulmonary who has not had  any significant pulmonary abnormalities during this to deconditioning and psychogenic component. I Personally reviewed of CT scan from last year showing evidence of a small hiatal hernia likely from recurrent progression.  There is no evidence of compression to the mediastinum or significant atelectasis to account for her pulmonary symptoms. Creat 1.6, glucose 243 rest bmp nml, cbc nml. She is on Plavix and ASA for CAD and stent  HPI  Past Medical History:  Diagnosis Date  . Anemia   . Anemia   . Arthritis    lower back, hips  . Blood transfusion without reported diagnosis   . CAD (coronary artery disease) FEB AND NOV 2009   6 STENTS  . Cancer ([place])    tumor back of neck-fibroushistocytoma  . Cataract   . Chronic kidney disease    Stage III  . Coronary artery disease   .  DDD (degenerative disc disease), lumbar   . Diabetes mellitus without complication ([place])    TYPE 2  . Herpes simplex   . Hyperlipidemia   . Hypertension    CONTROLLED ON MEDS  . Vertigo    occasional, no episodes in 2-3 months  . Vertigo    OCCASIONALLY  . Vitamin B 12 deficiency     Past Surgical History:  Procedure Laterality Date  . [place] STUDY N/A 02/04/2016   Procedure: [place] STUDY;  Surgeon: Lucilla Lame, MD;  Location: [facility] ENDOSCOPY;  Service: Endoscopy;  Laterality: N/A;  . ABDOMINAL HYSTERECTOMY    . BACK SURGERY     Lumbar spinal fusion 05/30/2015  . CARDIAC CATHETERIZATION  2009  . CATARACT EXTRACTION Left   . CATARACT EXTRACTION W/PHACO Right 03/19/2015   Procedure: CATARACT EXTRACTION PHACO AND INTRAOCULAR LENS PLACEMENT ([place]);  Surgeon: Leandrew Koyanagi, MD;  Location: [facility];  Service: Ophthalmology;  Laterality: Right;  DIABETIC - insulin and oral meds  . CHOLECYSTECTOMY N/A 05/19/2016   Procedure: LAPAROSCOPIC CHOLECYSTECTOMY;  Surgeon: Clayburn Pert, MD;  Location: [facility] ORS;  Service: General;  Laterality: N/A;  . CORONARY STENT PLACEMENT  03/2008  . CYSTECTOMY Right    wrist  . ESOPHAGEAL MANOMETRY N/A 02/04/2016   Procedure: ESOPHAGEAL MANOMETRY (EM);  Surgeon: Lucilla Lame, MD;  Location[facility] END[facility];  Service: Endoscopy;  Laterality: N/A;  . ESOPHAGOGASTRODUODENOSCOPY (EGD) WITH PROPOFOL N/A 09/04/2015   Procedure: ESOPHAGOGASTRODUODENOSCOPY (EGD) WITH PROPOFOL;  Surgeon: Lucilla Lame, MD;  Location: [facility] [facility];  Service: Endoscopy;  Laterality: N/A;  INSULIN DEPENDENT DIABETIC  . ESOPHAGOGASTRODUODENOSCOPY (EGD) WITH PROPOFOL N/A 06/29/2016   Procedure: ESOPHAGOGASTRODUODENOSCOPY (EGD) WITH PROPOFOL;  Surgeon: Jonathon Bellows, MD;  Location: [facility] ENDOSCOPY;  Service: Endoscopy;  Laterality: N/A;  . EYE SURGERY Left    cataract extraction  . HIATAL HERNIA REPAIR  02/2016   Dr Adonis Huguenin  . [place] CATH AND CORONARY ANGIOGRAPHY  Left 08/18/2017   Procedure: LEFT HEART CATH AND CORONARY ANGIOGRAPHY;  Surgeon: Teodoro Spray, MD;  Location: [facility] [facility];  Service: Cardiovascular;  Laterality: Left;  . LUMBAR FUSION  2001, 2014, 2015  . [place]     2001, 2014, 2015  . TUMOR EXCISION     back of neck    Family History  Problem Relation Age of Onset  . Heart disease Father   . Diabetes Other   . Breast cancer Neg Hx     Social History Social History   Tobacco Use  . Smoking status: Never Smoker  . Smokeless tobacco: Never Used  Substance Use Topics  . Alcohol use: Yes    Alcohol/week: 0.0 standard drinks    Comment: 2-3 drinks per year  . Drug use: No    Allergies  Allergen Reactions  . Tramadol Swelling  . Beta Adrenergic Blockers Hives  . Celebrex [Celecoxib] Other (See Comments)    A lot of pressure in head  . Gabapentin Other (See Comments)    Dizziness, arms weak, high dose only  . Niacin And Related Other (See Comments)    Flushing and hot sensation  . Oxycodone Nausea Only  . Shellfish Allergy Nausea And Vomiting  . Vicodin [Hydrocodone-Acetaminophen] Other (See Comments)    Hypoglycemic episodes  . Heparin Rash    Current Outpatient Medications  Medication Sig Dispense Refill  . acetaminophen (TYLENOL) 500 MG tablet Take 1,000 mg by mouth every 6 (six) hours as needed for mild pain or moderate pain.     Marland Kitchen acyclovir (ZOVIRAX) 400 MG tablet Take 400 mg by mouth daily as needed (for fever blisters).     Marland Kitchen aspirin EC 81 MG EC tablet Take 1 tablet (81 mg total) by mouth daily.    Marland Kitchen atorvastatin (LIPITOR) 40 MG tablet Take 40 mg by mouth at bedtime.     . Biotin 10 MG TABS Take 10 mg by mouth daily.     . Calcium Carb-Cholecalciferol 600-800 MG-UNIT CHEW Chew 1 each by mouth daily.     Marland Kitchen CARTIA XT 120 MG 24 hr capsule Take 120 mg by mouth daily.  3  . Cholecalciferol (VITAMIN D3) 1.25 MG (50000 UT) CAPS Take by mouth daily.    . clopidogrel (PLAVIX) 75 MG tablet Take 75 mg  by mouth daily.    . ferrous sulfate 325 (65 FE) MG EC tablet Take 325 mg by mouth daily.     Marland Kitchen glipiZIDE (GLUCOTROL) 10 MG tablet Take 10 mg by mouth 2 (two) times daily.    Marland Kitchen HUMALOG KWIKPEN 100 UNIT/ML KiwkPen Inject into the skin 3 (three) times daily as needed (Per sliding scale).   2  . insulin glargine (LANTUS) 100 UNIT/ML injection Inject 32 Units into the skin daily. Noon    . LORazepam (ATIVAN) 0.5 MG tablet  Take 0.5 mg by mouth every 8 (eight) hours as needed.     Marland Kitchen losartan (COZAAR) 100 MG tablet Take 100 mg by mouth daily at 12 noon.     . ondansetron (ZOFRAN ODT) 8 MG disintegrating tablet Take 8 mg by mouth as needed for nausea or vomiting.    . Probiotic Product (4X PROBIOTIC PO) Take 1 capsule by mouth daily.     . traZODone (DESYREL) 50 MG tablet Take 50 mg by mouth at bedtime as needed for sleep.     No current facility-administered medications for this visit.      Review of Systems Full ROS  was asked and was negative except for the information on the HPI  Physical Exam Blood pressure (!) 152/81, pulse (!) 120, temperature (!) 97.5 F (36.4 C), resp. rate 16, height 5' 2.5" (1.588 m), weight 186 lb 9.6 oz (84.6 kg), SpO2 97 %. CONSTITUTIONAL: NAD EYES: Pupils are equal, round, and reactive to light, Sclera are non-icteric. EARS, NOSE, MOUTH AND THROAT: The oropharynx is clear. The oral mucosa is pink and moist. Hearing is intact to voice. LYMPH NODES:  Lymph nodes in the neck are normal. RESPIRATORY:  Lungs are clear. There is normal respiratory effort, with equal breath sounds bilaterally, and without pathologic use of accessory muscles. CARDIOVASCULAR: Heart is regular without murmurs, gallops, or rubs. GI: The abdomen is soft, nontender, and nondistended. There are no palpable masses. There is no hepatosplenomegaly. There are normal bowel sounds in all quadrants. GU: Rectal deferred.   MUSCULOSKELETAL: Normal muscle strength and tone. No cyanosis or edema.   SKIN:  Turgor is good and there are no pathologic skin lesions or ulcers. NEUROLOGIC: Motor and sensation is grossly normal. Cranial nerves are grossly intact. PSYCH:  Oriented to person, place and time. Affect is normal.  Data Reviewed I have personally reviewed the patient's imaging, laboratory findings and medical records.    Assessment/Plan 68 year old female with unexplained dyspnea on exertion and some evidence of hiatal hernia recurrence.  Am not sure if her symptoms can be completely be attributed to her hiatal hernia.  We will start formal work-up to include barium swallow, upper endoscopy and CT scan of the chest and abdomen.  We will request  Re-evaluation by Dr. Alva Garnet from pulmonary medicine that has seen her in the past. Time spent with the patient was 60 minutes, with more than 50% of the time spent in face-to-face education, counseling and care coordination.     Caroleen Hamman, MD FACS General Surgeon 11/29/2018, 5:20 PM

## 2018-11-29 NOTE — Patient Instructions (Addendum)
You are scheduled for a Barium swallow at [facility] on 12/04/18 at 8:00 am.  You will need to arrive there by 7:45 am and enter in through the [facility].  You will not have anything to eat or drink for 3 hours prior.   _____________________________________________________  You are scheduled for a CT of the chest and abdomen at [facility] on 12/11/18 at 8:00 am.  You will need to arrive there by 7:45 am and have nothing to eat or drink after midnight the night prior.  You will need to pick up a prep kit.  ____________________________________________________  [facility] will call you to schedule an Upper Endoscopy study  You will follow up here after all studies with Dr Pabon to discuss surgery on 01/01/19. If you studies are not going to be completed by this time please call our office to reschedule this appointment for after they are done.    

## 2018-12-04 ENCOUNTER — Ambulatory Visit
Admission: RE | Admit: 2018-12-04 | Discharge: 2018-12-04 | Disposition: A | Discharge status: Home / Self Care | Payer: Medicare Other | Source: Ambulatory Visit | Attending: Surgery | Admitting: Surgery

## 2018-12-04 ENCOUNTER — Other Ambulatory Visit: Payer: Self-pay

## 2018-12-04 ENCOUNTER — Telehealth: Payer: Self-pay

## 2018-12-04 DIAGNOSIS — K449 Diaphragmatic hernia without obstruction or gangrene: Secondary | ICD-10-CM | POA: Diagnosis not present

## 2018-12-04 NOTE — Telephone Encounter (Signed)
Spoke with patient and her husband. They cancelled the appointment with Dr.Simonds due to not thinking they needed to keep it. Discussed with patient according to Dr.Pabon he would like for  Dr.Anna /endoscopy and Dr.Simonds appointments and the Ct scan to be done prior to 01/01/2019 appointment with Dr.Pabon. Patient verbalized understanding and will call Dr.Simonds office to reschedule.   They understand that if all the above havenot been done to call and reschedule appointment with Dr.Pabon until all has been done.

## 2018-12-04 NOTE — Telephone Encounter (Signed)
Left message for patient to return call to office. 

## 2018-12-11 ENCOUNTER — Ambulatory Visit
Admission: RE | Admit: 2018-12-11 | Discharge: 2018-12-11 | Disposition: A | Discharge status: Home / Self Care | Payer: Medicare Other | Source: Ambulatory Visit | Attending: Surgery | Admitting: Surgery

## 2018-12-11 ENCOUNTER — Other Ambulatory Visit: Payer: Self-pay

## 2018-12-11 DIAGNOSIS — K449 Diaphragmatic hernia without obstruction or gangrene: Secondary | ICD-10-CM | POA: Diagnosis present

## 2018-12-11 MED ORDER — IOHEXOL 300 MG/ML  SOLN
60.0000 mL | Freq: Once | INTRAMUSCULAR | Status: AC | PRN
  Administered 2018-12-11: 60 mL via INTRAVENOUS

## 2018-12-13 ENCOUNTER — Ambulatory Visit: Payer: Medicare Other | Admitting: Gastroenterology

## 2018-12-13 ENCOUNTER — Telehealth: Payer: Self-pay | Admitting: *Deleted

## 2018-12-13 NOTE — Telephone Encounter (Signed)
I am cancel her appt with Dr. Dahlia Byes until she has endoscopy with DR. Anna.Patient will call us back when it is scheduled .  Notified patient as instructed, patient pleased. has a very small hiatal herniaDiscussed follow-up appointments, patient agrees.

## 2018-12-14 ENCOUNTER — Ambulatory Visit (INDEPENDENT_AMBULATORY_CARE_PROVIDER_SITE_OTHER): Payer: Medicare Other | Admitting: Pulmonary Disease

## 2018-12-14 ENCOUNTER — Encounter: Payer: Self-pay | Admitting: Pulmonary Disease

## 2018-12-14 ENCOUNTER — Other Ambulatory Visit: Payer: Self-pay

## 2018-12-14 VITALS — BP 148/82 | HR 108 | Temp 97.7°F | Ht 62.5 in | Wt 185.6 lb

## 2018-12-14 DIAGNOSIS — R0609 Other forms of dyspnea: Secondary | ICD-10-CM

## 2018-12-14 MED ORDER — BREO ELLIPTA 100-25 MCG/INH IN AEPB: 1.0000 | INHALATION_SPRAY | Freq: Every day | RESPIRATORY_TRACT | 0 refills | Status: AC

## 2018-12-14 NOTE — Patient Instructions (Addendum)
Diagnostic studies ordered: 1) repeat pulmonary function tests (lung function test) 2) repeat echocardiogram with attention to pulmonary artery pressures  Trial of Breo inhaler, 1 inhalation daily.  Rinse mouth after use.  Sample provided.  Use daily for 1 week, then 1 week off, then daily for 1 week, then 1 week off.  Follow-up in 4 weeks.  Call sooner if needed

## 2018-12-21 NOTE — Progress Notes (Signed)
PROBLEMS: Unexplained episodic dyspnea  DATA: 09/26/17Spirometry: FVC 2.72 (91%), FEV1 1.93 (85%), FEV1/FVC 71%  08/05/17 CT chest: No acute pulmonary embolism. Mild cardiomegaly.  No acute pulmonary process.Old granulomatous disease. Small hiatal hernia with air-fluid level 10/25/17 PFTs: No obstruction, lung volumes normal.  Diffusion capacity measurement invalid, DLCO/VA normal 11/30/17 CPST: The interpretation of this test is limited due to submaximal effort during the exercise. Based on available data, exercise testing with gas exchange demonstrates low-normal functional capacity when compared to matched sedentary norms. Patient's body habitus, deconditioning is playing a significant role in her exercise intolerance. However, other submaximal parameters are suggestive of possible circulatory limitation. Note the tachycardia at rest and little to no change in BP during the incremental exercise  INTERVAL HISTORY: Last seen 12/26/17.  No major pulmonary events.    SUBJ: Last seen almost a year ago.  She has had no major changes overall.  She continues to be significantly impaired with exertional dyspnea.  Dyspnea has little day-to-day variation and no seasonal variation.  She cannot identify any specific triggers or alleviating factors.  She becomes dyspneic with minimal exertion even though she is trying to recondition herself by walking on a treadmill.  Previously, with intervention for hiatal hernia, her dyspnea improved significantly.  However, over time her symptoms have once again worsened.  It appears that her hiatal hernia has worsened as well and she is presently undergoing evaluation for further intervention regarding this.  In this evaluation it was noted that she has distal esophageal narrowing on barium swallow.  She is to be evaluated by gastroenterology for this.  Presently, she denies fever, chest pain, cough, purulent sputum, hemoptysis, lower extremity edema, calf  tenderness.  OBJ: Vitals:   12/14/18 1457  BP: (!) 148/82  Pulse: (!) 108  Temp: 97.7 F (36.5 C)  TempSrc: Temporal  SpO2: 97%  Weight: 185 lb 9.6 oz (84.2 kg)  Height: 5' 2.5" (1.588 m)  RA Gen: NAD  Gen: NAD HEENT: NCAT, sclerae white Neck: No JVD Lungs: breath sounds full, no wheezes or other adventitious sounds Cardiovascular: RRR, no murmurs Abdomen: Soft, nontender, normal BS Ext: without clubbing, cyanosis, edema Neuro: grossly intact Skin: Limited exam, no lesions noted    DATA: BMP Latest Ref Rng & Units 06/03/2018 08/25/2017 08/05/2017  Glucose 70 - 99 mg/dL 243(H) 312(H) -  BUN 8 - 23 mg/dL 35(H) 39(H) -  Creatinine 0.44 - 1.00 mg/dL 1.67(H) 2.14(H) 1.70(H)  Sodium 135 - 145 mmol/L 136 135 -  Potassium 3.5 - 5.1 mmol/L 5.1 5.1 -  Chloride 98 - 111 mmol/L 105 106 -  CO2 22 - 32 mmol/L 22 20(L) -  Calcium 8.9 - 10.3 mg/dL 9.2 8.4(L) -    CBC Latest Ref Rng & Units 06/03/2018 08/25/2017 02/24/2017  WBC 4.0 - 10.5 K/uL 7.3 8.2 6.8  Hemoglobin 12.0 - 15.0 g/dL 11.1(L) 10.9(L) 10.6(L)  Hematocrit 36.0 - 46.0 % 33.6(L) 32.6(L) 31.3(L)  Platelets 150 - 400 K/uL 232 208 207    CXR: No recent film  IMPRESSION:   ICD-10-CM   1. Unexplained dyspnea.  I do not think that this is attributable to hiatal hernia.  Previous extensive cardiac and pulmonary work-up has been unrevealing  R06.09 Pulmonary Function Test [facility] Only    ECHOCARDIOGRAM COMPLETE    CANCELED: ECHOCARDIOGRAM COMPLETE   The etiology of her profound exertional dyspnea remains unclear.  Previous cardiac and pulmonary evaluations have not revealed an etiology for severe DOE.  PLAN: Repeat PFTs ordered  Repeat echocardiogram with attention to pulmonary artery pressures ordered  Trial of Breo inhaler, 1 inhalation daily.  Rinse mouth after use.  Sample provided.  She is to use 1 week on, one-week off, 1 week on prior to follow-up  Follow-up in 4 weeks.  Call sooner if needed   Merton Border, MD PCCM  service Mobile [phone] Pager [phone] 12/21/2018

## 2018-12-22 ENCOUNTER — Other Ambulatory Visit: Payer: Self-pay

## 2018-12-26 ENCOUNTER — Telehealth: Payer: Self-pay | Admitting: Pulmonary Disease

## 2018-12-26 NOTE — Telephone Encounter (Signed)
Pt's spouse, [name]) is aware of date/time of covid and voiced his understanding.  Nothing further is needed.

## 2018-12-28 ENCOUNTER — Other Ambulatory Visit: Payer: Self-pay

## 2018-12-28 ENCOUNTER — Ambulatory Visit (INDEPENDENT_AMBULATORY_CARE_PROVIDER_SITE_OTHER): Payer: Medicare Other | Admitting: Gastroenterology

## 2018-12-28 ENCOUNTER — Encounter: Payer: Self-pay | Admitting: Gastroenterology

## 2018-12-28 VITALS — BP 140/85 | HR 109 | Temp 98.5°F | Ht 62.5 in | Wt 186.6 lb

## 2018-12-28 DIAGNOSIS — R935 Abnormal findings on diagnostic imaging of other abdominal regions, including retroperitoneum: Secondary | ICD-10-CM

## 2018-12-28 DIAGNOSIS — R1310 Dysphagia, unspecified: Secondary | ICD-10-CM | POA: Diagnosis not present

## 2018-12-28 DIAGNOSIS — R1319 Other dysphagia: Secondary | ICD-10-CM

## 2018-12-28 NOTE — Progress Notes (Signed)
Jonathon Bellows MD, MRCP(U.K) [address]  [place]  [place], [place] [address]  Main: [phone]  Fax: [phone]   Primary Care Physician: Idelle Crouch, MD  Primary Gastroenterologist:  Dr. Jonathon Bellows   No chief complaint on file.   HPI: Dorothy Rogers is a 68 y.o. female   She was last seen by myself in October 2018.  Previously seen for gastroparesis, constipation   Summary of history   I was consulted to see her on 05/17/16 for nausea and vomiting when admitted . Barium swallow with tablet 05/13/16 showed mild narrowing of GE jn with passage of tablet , possible mild wall thickening .She has had prior ER visits for same issues and discharges when it was attributed to anxiety. She has had a lap paraesophageal hernia repair with fundoplication in 96/7893 .EGD in 08/2015 by Dr Allen Norris for nausea/vomiting showed medium sized hiatal hernia, antral gastritis that was negative for H pylori on biopsy.At the time of her initial visit she was on Reglan ,had a positive HIDA scan , underwent a cholecystectomy. She did have elevated blood sugars ,some constpation.Gall bladder specimen showed chronic cholecystitis and cholelithiasis. Continued to have nausea after her cholecystectomy.Gastric emptying study showed only 27% of her stomach was emptied at 4 hour mark which is very abnormal and suggestive of gastroparesis   Interval history 03/29/2017 -12/28/2018  She has been presently referred by Dr. Dahlia Byes for an EGD to evaluate her hiatal hernia.  Barium swallow on 12/04/2018 showed mild deformity of the cervical esophagus secondary to prominent vertebral endplate osteophytes.  Esophagus is widely patent.  A prominent stricture is noted within the distal esophagus could be postsurgical inflammatory or malignant.  CT chest and abdomen on 12/11/2018 demonstrated small hiatal hernia.  She says that she is here today to discuss the results of the barium study and undergo endoscopy.  She  says that she is been having some shortness of breath which is been evaluated she is due for further tests including an echocardiogram and some breathing tests next week.  Some difficulty with swallowing solids she feels that it gets hung in the center of her chest but eventually goes down.  Denies any history of heartburn.  She is on Plavix.  Current Outpatient Medications  Medication Sig Dispense Refill   acetaminophen (TYLENOL) 500 MG tablet Take 1,000 mg by mouth every 6 (six) hours as needed for mild pain or moderate pain.      acyclovir (ZOVIRAX) 400 MG tablet Take 400 mg by mouth daily as needed (for fever blisters).      aspirin EC 81 MG EC tablet Take 1 tablet (81 mg total) by mouth daily.     atorvastatin (LIPITOR) 40 MG tablet Take 40 mg by mouth at bedtime.      Biotin 10 MG TABS Take 10 mg by mouth daily.      Calcium Carb-Cholecalciferol 600-800 MG-UNIT CHEW Chew 1 each by mouth daily.      CARTIA XT 120 MG 24 hr capsule Take 120 mg by mouth daily.  3   Cholecalciferol (VITAMIN D3) 1.25 MG (50000 UT) CAPS Take by mouth daily.     clopidogrel (PLAVIX) 75 MG tablet Take 75 mg by mouth daily.     ferrous sulfate 325 (65 FE) MG EC tablet Take 325 mg by mouth daily.      glipiZIDE (GLUCOTROL) 10 MG tablet Take 10 mg by mouth 2 (two) times daily.     HUMALOG [facility]  100 UNIT/ML KiwkPen Inject into the skin 3 (three) times daily as needed (Per sliding scale).   2   insulin glargine (LANTUS) 100 UNIT/ML injection Inject 32 Units into the skin daily. Noon     LORazepam (ATIVAN) 0.5 MG tablet Take 0.5 mg by mouth every 8 (eight) hours as needed.      losartan (COZAAR) 100 MG tablet Take 100 mg by mouth daily at 12 noon.      ondansetron (ZOFRAN ODT) 8 MG disintegrating tablet Take 8 mg by mouth as needed for nausea or vomiting.     Probiotic Product (4X PROBIOTIC PO) Take 1 capsule by mouth daily.      traZODone (DESYREL) 50 MG tablet Take 50 mg by mouth at bedtime as  needed for sleep.     vitamin B-12 (CYANOCOBALAMIN) 1000 MCG tablet Take 1,000 mcg by mouth daily.     No current facility-administered medications for this visit.     Allergies as of 12/28/2018 - Review Complete 12/14/2018  Allergen Reaction Noted   Tramadol Swelling 03/11/2016   Beta adrenergic blockers Hives 11/18/2014   Celebrex [celecoxib] Other (See Comments) 11/18/2014   Gabapentin Other (See Comments) 03/03/2017   Niacin and related Other (See Comments) 11/18/2014   Oxycodone Nausea Only 08/26/2015   Shellfish allergy Nausea And Vomiting 11/18/2014   Vicodin [hydrocodone-acetaminophen] Other (See Comments) 11/18/2014   Heparin Rash 12/01/2015    ROS:  General: Negative for anorexia, weight loss, fever, chills, fatigue, weakness. ENT: Negative for hoarseness, difficulty swallowing , nasal congestion. CV: Negative for chest pain, angina, palpitations, dyspnea on exertion, peripheral edema.  Respiratory: Negative for dyspnea at rest, dyspnea on exertion, cough, sputum, wheezing.  GI: See history of present illness. GU:  Negative for dysuria, hematuria, urinary incontinence, urinary frequency, nocturnal urination.  Endo: Negative for unusual weight change.    Physical Examination:   There were no vitals taken for this visit.  General: Well-nourished, well-developed in no acute distress.  Eyes: No icterus. Conjunctivae pink. Mouth: Oropharyngeal mucosa moist and pink , no lesions erythema or exudate. Lungs: Clear to auscultation bilaterally. Non-labored. Heart: Regular rate and rhythm, no murmurs rubs or gallops.  Abdomen: Bowel sounds are normal, nontender, nondistended, no hepatosplenomegaly or masses, no abdominal bruits or hernia , no rebound or guarding.   Extremities: No lower extremity edema. No clubbing or deformities. Neuro: Alert and oriented x 3.  Grossly intact. Skin: Warm and dry, no jaundice.   Psych: Alert and cooperative, normal mood and  affect.   Imaging Studies: Ct Chest W Contrast  Result Date: 12/11/2018 CLINICAL DATA:  Diaphragmatic hernia, worsening shortness of breath EXAM: CT CHEST AND ABDOMEN WITH CONTRAST TECHNIQUE: Multidetector CT imaging of the chest and abdomen was performed following the standard protocol during bolus administration of intravenous contrast. CONTRAST:  32mL OMNIPAQUE IOHEXOL 300 MG/ML SOLN, additional oral enteric contrast COMPARISON:  CT chest angiogram, 08/05/2017 FINDINGS: CT CHEST FINDINGS Cardiovascular: No significant vascular findings. Normal heart size. Extensive 3 vessel coronary artery calcifications and/or stents. No pericardial effusion. Mediastinum/Nodes: No enlarged mediastinal, hilar, or axillary lymph nodes. Benign calcified right hilar lymph node. Small hiatal hernia. Thyroid gland, trachea, and esophagus demonstrate no significant findings. Lungs/Pleura: Lungs are clear. Benign calcified nodule of the superior segment right lower lobe (series 4, image 63). No pleural effusion or pneumothorax. Musculoskeletal: No chest wall mass or suspicious bone lesions identified. CT ABDOMEN FINDINGS Hepatobiliary: No focal liver abnormality is seen. Status post cholecystectomy. No biliary dilatation. Pancreas: Unremarkable. No  pancreatic ductal dilatation or surrounding inflammatory changes. Spleen: Normal in size without significant abnormality. Adrenals/Urinary Tract: Adrenal glands are unremarkable. Kidneys are normal, without renal calculi, solid lesion, or hydronephrosis. Stomach/Bowel: Stomach is within normal limits. Incidental duodenal diverticulum. No evidence of bowel wall thickening, distention, or inflammatory changes. Vascular/Lymphatic: Aortic atherosclerosis. No enlarged abdominal or pelvic lymph nodes. Other: No abdominal wall hernia or abnormality. No abdominopelvic ascites. Musculoskeletal: Extensive posterior lumbar fusion. IMPRESSION: 1.  Small hiatal hernia. 2. No CT findings to explain  shortness of breath. No acute airspace opacity. 2.  Chronic and incidental findings as detailed above. Electronically Signed   By: Eddie Candle M.D.   On: 12/11/2018 08:44   Ct Abdomen W Contrast  Result Date: 12/11/2018 CLINICAL DATA:  Diaphragmatic hernia, worsening shortness of breath EXAM: CT CHEST AND ABDOMEN WITH CONTRAST TECHNIQUE: Multidetector CT imaging of the chest and abdomen was performed following the standard protocol during bolus administration of intravenous contrast. CONTRAST:  56mL OMNIPAQUE IOHEXOL 300 MG/ML SOLN, additional oral enteric contrast COMPARISON:  CT chest angiogram, 08/05/2017 FINDINGS: CT CHEST FINDINGS Cardiovascular: No significant vascular findings. Normal heart size. Extensive 3 vessel coronary artery calcifications and/or stents. No pericardial effusion. Mediastinum/Nodes: No enlarged mediastinal, hilar, or axillary lymph nodes. Benign calcified right hilar lymph node. Small hiatal hernia. Thyroid gland, trachea, and esophagus demonstrate no significant findings. Lungs/Pleura: Lungs are clear. Benign calcified nodule of the superior segment right lower lobe (series 4, image 63). No pleural effusion or pneumothorax. Musculoskeletal: No chest wall mass or suspicious bone lesions identified. CT ABDOMEN FINDINGS Hepatobiliary: No focal liver abnormality is seen. Status post cholecystectomy. No biliary dilatation. Pancreas: Unremarkable. No pancreatic ductal dilatation or surrounding inflammatory changes. Spleen: Normal in size without significant abnormality. Adrenals/Urinary Tract: Adrenal glands are unremarkable. Kidneys are normal, without renal calculi, solid lesion, or hydronephrosis. Stomach/Bowel: Stomach is within normal limits. Incidental duodenal diverticulum. No evidence of bowel wall thickening, distention, or inflammatory changes. Vascular/Lymphatic: Aortic atherosclerosis. No enlarged abdominal or pelvic lymph nodes. Other: No abdominal wall hernia or abnormality.  No abdominopelvic ascites. Musculoskeletal: Extensive posterior lumbar fusion. IMPRESSION: 1.  Small hiatal hernia. 2. No CT findings to explain shortness of breath. No acute airspace opacity. 2.  Chronic and incidental findings as detailed above. Electronically Signed   By: Eddie Candle M.D.   On: 12/11/2018 08:44   Dg Esophagus W Single Cm (sol Or Thin Ba)  Result Date: 12/04/2018 CLINICAL DATA:  Hernia.  Prior surgery. EXAM: ESOPHOGRAM/BARIUM SWALLOW TECHNIQUE: Combined double contrast and single contrast examination performed using effervescent crystals, thick barium liquid, and thin barium liquid. FLUOROSCOPY TIME:  Fluoroscopy Time:  1 minutes 24 seconds Radiation Exposure Index (if provided by the fluoroscopic device): 34.9 mGy Number of Acquired Spot Images: 27 COMPARISON:  CT 08/05/2017.  Barium swallow 05/13/2016. FINDINGS: Mild deformity noted of the cervical esophagus posteriorly secondary to prominent vertebral endplate osteophytes. Cervical esophagus is widely patent. No aspiration. A prominent stricture is again noted within the distal esophagus. This could be postsurgical. Inflammatory malignant stricture cannot be excluded. Ulceration cannot be excluded. Given the prominence of the stricture a barium tablet was not administered. Peristalsis normal. No prominent reflux demonstrated. IMPRESSION: 1. Mild deformity noted of the cervical esophagus posteriorly secondary to prominent vertebral endplate osteophytes. Cervical esophagus is widely patent. No aspiration. 2. A prominent stricture is again noted within the distal esophagus. This could be postsurgical. Inflammatory or malignant stricture cannot be excluded. Ulceration within the stricture cannot be excluded. Electronically Signed  By: [place]   On: 12/04/2018 08:39    Assessment and Plan:   Dorothy Rogers is a 68 y.o. y/o female  referred back to see me.  She was recently evaluated for shortness of breath on exertion and during  the evaluation had a barium swallow which showed possible narrowing at the GE junction.  She has had a Nissen's fundoplication previously and probably is the underlying cause.  I have been requested to perform an EGD for further evaluation.  She does carry a diagnosis of gastroparesis and constipation which are under control from the past.  Plan 1.  EGD with possible dilation at the GE junction to 15 mm due to history of dysphagia. 2.  EGD to be performed after cardiac and pulmonary clearance as she has a history of shortness of breath.  She will also require Plavix holding instructions.  I have discussed alternative options, risks & benefits,  which include, but are not limited to, bleeding, infection, perforation,respiratory complication & drug reaction.  The patient agrees with this plan & written consent will be obtained.      Dr Jonathon Bellows  MD,MRCP [facility]) Follow up in 8 weeks

## 2018-12-29 ENCOUNTER — Other Ambulatory Visit: Payer: Self-pay

## 2019-01-01 ENCOUNTER — Other Ambulatory Visit
Admission: RE | Admit: 2019-01-01 | Discharge: 2019-01-01 | Disposition: A | Discharge status: Home / Self Care | Payer: Medicare Other | Source: Ambulatory Visit | Attending: Pulmonary Disease | Admitting: Pulmonary Disease

## 2019-01-01 ENCOUNTER — Ambulatory Visit: Payer: Medicare Other | Admitting: Surgery

## 2019-01-01 ENCOUNTER — Other Ambulatory Visit: Payer: Self-pay

## 2019-01-01 DIAGNOSIS — Z20828 Contact with and (suspected) exposure to other viral communicable diseases: Secondary | ICD-10-CM | POA: Diagnosis not present

## 2019-01-01 DIAGNOSIS — Z01812 Encounter for preprocedural laboratory examination: Secondary | ICD-10-CM | POA: Insufficient documentation

## 2019-01-01 LAB — SARS CORONAVIRUS 2 (TAT 6-24 HRS): SARS Coronavirus 2: NEGATIVE

## 2019-01-04 ENCOUNTER — Ambulatory Visit: Payer: Medicare Other | Attending: Pulmonary Disease

## 2019-01-04 ENCOUNTER — Other Ambulatory Visit: Payer: Self-pay

## 2019-01-04 DIAGNOSIS — R0609 Other forms of dyspnea: Secondary | ICD-10-CM | POA: Insufficient documentation

## 2019-01-04 MED ORDER — ALBUTEROL SULFATE (2.5 MG/3ML) 0.083% IN NEBU
2.5000 mg | INHALATION_SOLUTION | Freq: Once | RESPIRATORY_TRACT | Status: AC
  Administered 2019-01-04: 13:00:00 2.5 mg via RESPIRATORY_TRACT
  Filled 2019-01-04: qty 3

## 2019-01-05 ENCOUNTER — Ambulatory Visit (INDEPENDENT_AMBULATORY_CARE_PROVIDER_SITE_OTHER): Payer: Medicare Other

## 2019-01-05 DIAGNOSIS — R0609 Other forms of dyspnea: Secondary | ICD-10-CM

## 2019-01-05 MED ORDER — PERFLUTREN LIPID MICROSPHERE
1.0000 mL | INTRAVENOUS | Status: AC | PRN
  Administered 2019-01-05: 2 mL via INTRAVENOUS

## 2019-01-10 ENCOUNTER — Ambulatory Visit: Payer: Medicare Other | Admitting: Pulmonary Disease

## 2019-01-11 ENCOUNTER — Other Ambulatory Visit: Payer: Self-pay

## 2019-01-11 ENCOUNTER — Encounter: Payer: Self-pay | Admitting: Pulmonary Disease

## 2019-01-11 ENCOUNTER — Ambulatory Visit (INDEPENDENT_AMBULATORY_CARE_PROVIDER_SITE_OTHER): Payer: Medicare Other | Admitting: Pulmonary Disease

## 2019-01-11 ENCOUNTER — Ambulatory Visit
Admission: RE | Admit: 2019-01-11 | Discharge: 2019-01-11 | Disposition: A | Discharge status: Home / Self Care | Payer: Medicare Other | Source: Ambulatory Visit | Attending: Pulmonary Disease | Admitting: Pulmonary Disease

## 2019-01-11 ENCOUNTER — Other Ambulatory Visit: Payer: Self-pay | Admitting: Pulmonary Disease

## 2019-01-11 VITALS — BP 132/78 | HR 117 | Temp 97.6°F | Ht 62.0 in | Wt 187.2 lb

## 2019-01-11 DIAGNOSIS — R0602 Shortness of breath: Secondary | ICD-10-CM

## 2019-01-11 DIAGNOSIS — J984 Other disorders of lung: Secondary | ICD-10-CM

## 2019-01-11 DIAGNOSIS — R0609 Other forms of dyspnea: Secondary | ICD-10-CM | POA: Diagnosis not present

## 2019-01-11 DIAGNOSIS — R000 Tachycardia, unspecified: Secondary | ICD-10-CM

## 2019-01-11 DIAGNOSIS — I5189 Other ill-defined heart diseases: Secondary | ICD-10-CM

## 2019-01-11 NOTE — Progress Notes (Signed)
PULMONARY OFFICE FOLLOW UP NOTE  Requesting MD/Service: Doy Hutching Date of initial consultation: 01/19/16 Reason for consultation: Dyspnea  PT PROFILE: 68 y.o. F referred for evaluation of severe episodic dyspnea X several months. She has undergone extensive cardiac evaluation and CTA chest which has been unrevealing. An ABG revealed severe primary respiratory alkalosis. 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   PROBLEMS: Unexplained severe exertional dyspnea  DATA: 02/24/16 Spirometry: FVC 2.72 (91%), FEV1 1.93 (85%), FEV1/FVC 71%  08/05/17 CT chest: No acute pulmonary embolism. Mild cardiomegaly.  No acute pulmonary process.Old granulomatous disease. Small hiatal hernia with air-fluid level 10/25/17 PFTs: No obstruction, lung volumes normal.  Diffusion capacity measurement invalid, DLCO/VA normal 11/30/17 CPST: The interpretation of this test is limited due to submaximal effort during the exercise. Based on available data, exercise testing with gas exchange demonstrates low-normal functional capacity when compared to matched sedentary norms. Patient's body habitus, deconditioning is playing a significant role in her exercise intolerance. However, other submaximal parameters are suggestive of possible circulatory limitation. Note the tachycardia at rest and little to no change in BP during the incremental exercise 12/11/18 CT chest: Small hiatal hernia. No CT findings to explain shortness of breath. No acute airspace opacity 01/04/19 PFTs: FVC: 0.79 L (27 %pred), FEV1: 0.66 L (30 %pred), FEV1/FVC: 83%, TLC: 3.62 L (75 %pred), DLCO maneuver could not be completed.  These test results are physiologically impossible as her inspiratory capacity exceeds forced vital capacity.  Nonetheless, they do suggest restrictive physiology.  The pattern of restriction suggests possible neuromuscular cause.  01/05/19 Echocardiogram: LVEF 60-65%.  Diastolic Doppler parameters consistent with impaired  relaxation.  LA size normal.  RVSP could not be estimated but RA and RV normal with no evidence of volume overload.   INTERVAL HISTORY: Last seen 12/14/18. Here to follow up on repeat PFTs and echocardiogram.  No major events in interim.    SUBJ: There is no change overall.  She remains profoundly limited in her exercise capacity due to dyspnea..  She has no other new complaints.  During this encounter, I ambulated her slowly 2 laps around our office and she was notably dyspneic with this minimal exertion.  She did not desaturate  OBJ: Vitals:   01/11/19 1358  BP: 132/78  Pulse: (!) 117  Temp: 97.6 F (36.4 C)  TempSrc: Temporal  SpO2: 100%  Weight: 187 lb 3.2 oz (84.9 kg)  Height: 5\' 2"  (1.575 m)  RA  Dyspneic with minimal exertion (slow ambulation of 2 laps around the office) Gen: NAD HEENT: NCAT, sclerae white Neck: No JVD Lungs: No wheezes or other adventitious sounds Cardiovascular: RRR, no murmurs Abdomen: Soft, nontender, normal BS Ext: without clubbing, cyanosis, edema Neuro: grossly intact Skin: Limited exam, no lesions noted     DATA: BMP Latest Ref Rng & Units 06/03/2018 08/25/2017 08/05/2017  Glucose 70 - 99 mg/dL 243(H) 312(H) -  BUN 8 - 23 mg/dL 35(H) 39(H) -  Creatinine 0.44 - 1.00 mg/dL 1.67(H) 2.14(H) 1.70(H)  Sodium 135 - 145 mmol/L 136 135 -  Potassium 3.5 - 5.1 mmol/L 5.1 5.1 -  Chloride 98 - 111 mmol/L 105 106 -  CO2 22 - 32 mmol/L 22 20(L) -  Calcium 8.9 - 10.3 mg/dL 9.2 8.4(L) -    CBC Latest Ref Rng & Units 06/03/2018 08/25/2017 02/24/2017  WBC 4.0 - 10.5 K/uL 7.3 8.2 6.8  Hemoglobin 12.0 - 15.0 g/dL 11.1(L) 10.9(L) 10.6(L)  Hematocrit 36.0 - 46.0 % 33.6(L) 32.6(L) 31.3(L)  Platelets 150 - 400  K/uL 232 208 207    CXR 08/13: No acute findings.  Lung volumes appear normal.  Cardiac silhouette is normal.  No edema or infiltrates.  IMPRESSION:   ICD-10-CM   1. Severe DOE - unexplained  R06.09 DG Sniff Test  2. Diastolic dysfunction  P77.93   3.  Restrictive pattern on PFTs with normal CT chest - ? testing error  J98.4 DG Sniff Test  4. Exercise-induced tachycardia  R00.0    The findings on PFTs are unexpected and not consistent with prior spirometry and PFTs.  However, the flow volume curves suggest a good effort on her part.  The reported values are physiologically impossible (inspiratory capacity exceeds forced vital capacity).  This might simply represent testing/calibration error.  However, there does appear to be restriction in a pattern that suggests neuromuscular problem. Given the profundity of her symptoms and entirely normal findings on CT of the chest, I think we need to consider the possibility of neuromuscular disease.  It is notable that she has had multiple spinal surgeries including cervical spine surgery in 2018.  One consideration would be the possibility of diaphragmatic dysfunction.     PLAN: I reviewed the PFT testing with respiratory therapy.  We went back and looked at the "raw data".  We could not find any obvious error in measurement.  We might consider repeating spirometry in the near future.  I do not think we need to repeat lung function measurements.  Sniff test ordered to look at diaphragmatic function  I will discuss with Dr. Ubaldo Glassing the possibility of increasing the dose of diltiazem in response to the finding of significant exercise-induced tachycardia which might be contributing to exercise intolerance  Follow-up with Dr. Patsey Berthold in 6-8 weeks.  Call sooner if needed   Merton Border, MD PCCM service Mobile [phone] Pager [phone] 01/19/2019 1:11 PM

## 2019-01-11 NOTE — Patient Instructions (Signed)
Sniff test (to look at your diaphragm function) ordered  I will review your lung function tests with respiratory therapist to ensure that they are accurate.  These tests might need to be repeated  I will discuss with Dr. Ubaldo Glassing the possibility of increasing the dose of diltiazem to better control your heart rate with exertion  Follow-up with Dr. Patsey Berthold in 6-8 weeks.  Call sooner if needed

## 2019-01-15 ENCOUNTER — Telehealth: Payer: Self-pay | Admitting: Gastroenterology

## 2019-01-15 NOTE — Telephone Encounter (Signed)
Mr Tibbitts called & stats patient was seen on 12-28-18 by DR ANNA stated someone would call & schedule an endoscopy. Please call.

## 2019-01-16 ENCOUNTER — Other Ambulatory Visit: Payer: Self-pay

## 2019-01-16 ENCOUNTER — Ambulatory Visit
Admission: RE | Admit: 2019-01-16 | Discharge: 2019-01-16 | Disposition: A | Discharge status: Home / Self Care | Payer: Medicare Other | Source: Ambulatory Visit | Attending: Pulmonary Disease | Admitting: Pulmonary Disease

## 2019-01-16 DIAGNOSIS — R1310 Dysphagia, unspecified: Secondary | ICD-10-CM

## 2019-01-16 DIAGNOSIS — J984 Other disorders of lung: Secondary | ICD-10-CM | POA: Insufficient documentation

## 2019-01-16 DIAGNOSIS — R935 Abnormal findings on diagnostic imaging of other abdominal regions, including retroperitoneum: Secondary | ICD-10-CM

## 2019-01-16 DIAGNOSIS — R1319 Other dysphagia: Secondary | ICD-10-CM

## 2019-01-16 DIAGNOSIS — R0609 Other forms of dyspnea: Secondary | ICD-10-CM | POA: Insufficient documentation

## 2019-01-16 NOTE — Telephone Encounter (Signed)
Spoke with pt husband, [name], and informed him that we are waiting for pt cardiac and pulmonary clearance but we can proceed with scheduling pt procedure as long as we allow enough time to receive medical clearance. He agrees, we have scheduled pt procedure.

## 2019-01-22 ENCOUNTER — Other Ambulatory Visit
Admission: RE | Admit: 2019-01-22 | Discharge: 2019-01-22 | Disposition: A | Discharge status: Home / Self Care | Payer: Medicare Other | Source: Ambulatory Visit | Attending: Gastroenterology | Admitting: Gastroenterology

## 2019-01-22 ENCOUNTER — Other Ambulatory Visit: Payer: Self-pay

## 2019-01-22 DIAGNOSIS — Z20828 Contact with and (suspected) exposure to other viral communicable diseases: Secondary | ICD-10-CM | POA: Insufficient documentation

## 2019-01-22 DIAGNOSIS — Z01812 Encounter for preprocedural laboratory examination: Secondary | ICD-10-CM | POA: Diagnosis not present

## 2019-01-23 LAB — SARS CORONAVIRUS 2 (TAT 6-24 HRS): SARS Coronavirus 2: NEGATIVE

## 2019-01-24 ENCOUNTER — Encounter: Payer: Self-pay | Admitting: *Deleted

## 2019-01-25 ENCOUNTER — Encounter
Admission: RE | Disposition: A | Discharge status: Home / Self Care | Payer: Self-pay | Source: Home / Self Care | Attending: Gastroenterology

## 2019-01-25 ENCOUNTER — Ambulatory Visit: Payer: Medicare Other | Admitting: Anesthesiology

## 2019-01-25 ENCOUNTER — Encounter: Payer: Self-pay | Admitting: *Deleted

## 2019-01-25 ENCOUNTER — Ambulatory Visit
Admission: RE | Admit: 2019-01-25 | Discharge: 2019-01-25 | Disposition: A | Discharge status: Home / Self Care | Payer: Medicare Other | Attending: Gastroenterology | Admitting: Gastroenterology

## 2019-01-25 ENCOUNTER — Other Ambulatory Visit: Payer: Self-pay

## 2019-01-25 DIAGNOSIS — E785 Hyperlipidemia, unspecified: Secondary | ICD-10-CM | POA: Insufficient documentation

## 2019-01-25 DIAGNOSIS — Z79899 Other long term (current) drug therapy: Secondary | ICD-10-CM | POA: Diagnosis not present

## 2019-01-25 DIAGNOSIS — E538 Deficiency of other specified B group vitamins: Secondary | ICD-10-CM | POA: Insufficient documentation

## 2019-01-25 DIAGNOSIS — E1122 Type 2 diabetes mellitus with diabetic chronic kidney disease: Secondary | ICD-10-CM | POA: Insufficient documentation

## 2019-01-25 DIAGNOSIS — I129 Hypertensive chronic kidney disease with stage 1 through stage 4 chronic kidney disease, or unspecified chronic kidney disease: Secondary | ICD-10-CM | POA: Diagnosis not present

## 2019-01-25 DIAGNOSIS — Z794 Long term (current) use of insulin: Secondary | ICD-10-CM | POA: Insufficient documentation

## 2019-01-25 DIAGNOSIS — R1319 Other dysphagia: Secondary | ICD-10-CM

## 2019-01-25 DIAGNOSIS — I2510 Atherosclerotic heart disease of native coronary artery without angina pectoris: Secondary | ICD-10-CM | POA: Diagnosis not present

## 2019-01-25 DIAGNOSIS — N183 Chronic kidney disease, stage 3 (moderate): Secondary | ICD-10-CM | POA: Diagnosis not present

## 2019-01-25 DIAGNOSIS — R935 Abnormal findings on diagnostic imaging of other abdominal regions, including retroperitoneum: Secondary | ICD-10-CM

## 2019-01-25 DIAGNOSIS — Z7982 Long term (current) use of aspirin: Secondary | ICD-10-CM | POA: Insufficient documentation

## 2019-01-25 DIAGNOSIS — R1310 Dysphagia, unspecified: Secondary | ICD-10-CM | POA: Insufficient documentation

## 2019-01-25 DIAGNOSIS — Z955 Presence of coronary angioplasty implant and graft: Secondary | ICD-10-CM | POA: Insufficient documentation

## 2019-01-25 DIAGNOSIS — Z7902 Long term (current) use of antithrombotics/antiplatelets: Secondary | ICD-10-CM | POA: Insufficient documentation

## 2019-01-25 HISTORY — PX: ESOPHAGOGASTRODUODENOSCOPY (EGD) WITH PROPOFOL: SHX5813

## 2019-01-25 LAB — GLUCOSE, CAPILLARY: Glucose-Capillary: 147 mg/dL — ABNORMAL HIGH (ref 70–99)

## 2019-01-25 SURGERY — ESOPHAGOGASTRODUODENOSCOPY (EGD) WITH PROPOFOL
Anesthesia: General

## 2019-01-25 MED ORDER — PROPOFOL 10 MG/ML IV BOLUS
INTRAVENOUS | Status: DC | PRN
  Administered 2019-01-25: 70 mg via INTRAVENOUS

## 2019-01-25 MED ORDER — LIDOCAINE HCL (CARDIAC) PF 100 MG/5ML IV SOSY
PREFILLED_SYRINGE | INTRAVENOUS | Status: DC | PRN
  Administered 2019-01-25: 60 mg via INTRAVENOUS

## 2019-01-25 MED ORDER — SODIUM CHLORIDE 0.9 % IV SOLN: INTRAVENOUS | Status: DC

## 2019-01-25 MED ORDER — GLYCOPYRROLATE 0.2 MG/ML IJ SOLN
INTRAMUSCULAR | Status: DC | PRN
  Administered 2019-01-25: 0.2 mg via INTRAVENOUS

## 2019-01-25 MED ORDER — PROPOFOL 500 MG/50ML IV EMUL
INTRAVENOUS | Status: DC | PRN
  Administered 2019-01-25: 140 ug/kg/min via INTRAVENOUS

## 2019-01-25 NOTE — Op Note (Signed)
[facility] Gastroenterology Patient Name: Dorothy Rogers Procedure Date: 01/25/2019 8:48 AM MRN: [id number] Account #: [id number] Date of Birth: 1951-02-13 Admit Type: Outpatient Age: 68 Room: [facility] ENDO ROOM 4 Gender: Female Note Status: Finalized Procedure:            Upper GI endoscopy Indications:          Dysphagia Providers:            Jonathon Bellows MD, MD Referring MD:         Leonie Douglas. Doy Hutching, MD (Referring MD) Medicines:            Monitored Anesthesia Care Complications:        No immediate complications. Procedure:            Pre-Anesthesia Assessment:                       - Prior to the procedure, a History and Physical was                        performed, and patient medications, allergies and                        sensitivities were reviewed. The patient's tolerance of                        previous anesthesia was reviewed.                       - The risks and benefits of the procedure and the                        sedation options and risks were discussed with the                        patient. All questions were answered and informed                        consent was obtained.                       - ASA Grade Assessment: II - A patient with mild                        systemic disease.                       After obtaining informed consent, the endoscope was                        passed under direct vision. Throughout the procedure,                        the patient's blood pressure, pulse, and oxygen                        saturations were monitored continuously. The Endoscope                        was introduced through the mouth, and advanced to the  third part of duodenum. The upper GI endoscopy was                        accomplished with ease. The patient tolerated the                        procedure well. Findings:      The esophagus was normal.      A large amount of food (residue) was found in the entire  examined       stomach.      The examined duodenum was normal. Impression:           - Normal esophagus.                       - A large amount of food (residue) in the stomach.                       - Normal examined duodenum.                       - No specimens collected. Recommendation:       - Discharge patient to home (with escort).                       - Resume previous diet.                       - Continue present medications.                       - Repeat upper endoscopy in 2 weeks.                       - suggest 24 hours of clears prior to procedure with                        low fibert diert for preceeding 72 hours Procedure Code(s):    --- Professional ---                       [phone], Esophagogastroduodenoscopy, flexible, transoral;                        diagnostic, including collection of specimen(s) by                        brushing or washing, when performed (separate procedure) Diagnosis Code(s):    --- Professional ---                       R13.10, Dysphagia, unspecified CPT copyright 2019 American Medical Association. All rights reserved. The codes documented in this report are preliminary and upon coder review may  be revised to meet current compliance requirements. Jonathon Bellows, MD Jonathon Bellows MD, MD 01/25/2019 9:01:04 AM This report has been signed electronically. Number of Addenda: 0 Note Initiated On: 01/25/2019 8:48 AM Total Procedure Duration: 0 hours 5 minutes 38 seconds  Estimated Blood Loss: Estimated blood loss: none.      [facility]

## 2019-01-25 NOTE — Transfer of Care (Signed)
Immediate Anesthesia Transfer of Care Note  Patient: Dorothy Rogers  Procedure(s) Performed: ESOPHAGOGASTRODUODENOSCOPY (EGD) WITH PROPOFOL (N/A )  Patient Location: PACU  Anesthesia Type:General  Level of Consciousness: sedated  Airway & Oxygen Therapy: Patient Spontanous Breathing  Post-op Assessment: Report given to RN and Post -op Vital signs reviewed and stable  Post vital signs: Reviewed and stable  Last Vitals:  Vitals Value Taken Time  BP 94/60 01/25/19 0903  Temp 36.4 C 01/25/19 0903  Pulse 102 01/25/19 0903  Resp 18 01/25/19 0903  SpO2 95 % 01/25/19 0903    Last Pain:  Vitals:   01/25/19 0903  TempSrc:   PainSc: 0-No pain         Complications: No apparent anesthesia complications

## 2019-01-25 NOTE — H&P (Signed)
Jonathon Bellows, MD [address] [place], [place], [place], [address] [id number], [place], [place], [place], [address] Phone: [phone]  Fax: [phone]  Primary Care Physician:  Idelle Crouch, MD   Pre-Procedure History & Physical: HPI:  Dorothy Rogers is a 68 y.o. female is here for an endoscopy    Past Medical History:  Diagnosis Date  . Anemia   . Anemia   . Arthritis    lower back, hips  . Blood transfusion without reported diagnosis   . CAD (coronary artery disease) FEB AND NOV 2009   6 STENTS  . 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 Cataract   . Chronic kidney disease    Stage III  . Coronary artery disease   . DDD (degenerative disc disease), lumbar   . Diabetes mellitus without complication ([place])    TYPE 2  . Herpes simplex   . Hyperlipidemia   . Hypertension    CONTROLLED ON MEDS  . Vertigo    occasional, no episodes in 2-3 months  . Vertigo    OCCASIONALLY  . Vitamin B 12 deficiency     Past Surgical History:  Procedure Laterality Date  . [place] STUDY N/A 02/04/2016   Procedure: [place] STUDY;  Surgeon: Lucilla Lame, MD;  Location: [facility] ENDOSCOPY;  Service: Endoscopy;  Laterality: N/A;  . ABDOMINAL HYSTERECTOMY    . BACK SURGERY     Lumbar spinal fusion 05/30/2015  . CARDIAC CATHETERIZATION  2009  . CATARACT EXTRACTION Left   . CATARACT EXTRACTION W/PHACO Right 03/19/2015   Procedure: CATARACT EXTRACTION PHACO AND INTRAOCULAR LENS PLACEMENT ([place]);  Surgeon: Leandrew Koyanagi, MD;  Location: [place];  Service: Ophthalmology;  Laterality: Right;  DIABETIC - insulin and oral meds  . CHOLECYSTECTOMY N/A 05/19/2016   Procedure: LAPAROSCOPIC CHOLECYSTECTOMY;  Surgeon: [place] Pert, MD;  Location: [facility] ORS;  Service: General;  Laterality: N/A;  . CORONARY STENT PLACEMENT  03/2008  . CYSTECTOMY Right    wrist  . ESOPHAGEAL MANOMETRY N/A 02/04/2016   Procedure: ESOPHAGEAL MANOMETRY (EM);  Surgeon:  Lucilla Lame, MD;  Location: [facility] ENDOSCOPY;  Service: Endoscopy;  Laterality: N/A;  . ESOPHAGOGASTRODUODENOSCOPY (EGD) WITH PROPOFOL N/A 09/04/2015   Procedure: ESOPHAGOGASTRODUODENOSCOPY (EGD) WITH PROPOFOL;  Surgeon: Lucilla Lame, MD;  Location: [place];  Service: Endoscopy;  Laterality: N/A;  INSULIN DEPENDENT DIABETIC  . ESOPHAGOGASTRODUODENOSCOPY (EGD) WITH PROPOFOL N/A 06/29/2016   Procedure: ESOPHAGOGASTRODUODENOSCOPY (EGD) WITH PROPOFOL;  Surgeon: Jonathon Bellows, MD;  Location: [facility] ENDOSCOPY;  Service: Endoscopy;  Laterality: N/A;  . EYE SURGERY Left    cataract extraction  . HIATAL HERNIA REPAIR  02/2016   Dr Adonis Huguenin  . [place] CATH AND CORONARY ANGIOGRAPHY Left 08/18/2017   Procedure: LEFT HEART CATH AND CORONARY ANGIOGRAPHY;  Surgeon: Teodoro Spray, MD;  Location: [place] CV LAB;  Service: Cardiovascular;  Laterality: Left;  . LUMBAR FUSION  2001, 2014, 2015  . [place]     2001, 2014, 2015  . TUMOR EXCISION     back of neck    Prior to Admission medications   Medication Sig Start Date End Date Taking? Authorizing Provider  acetaminophen (TYLENOL) 500 MG tablet Take 1,000 mg by mouth every 6 (six) hours as needed for mild pain or moderate pain.    Yes [provider]  acyclovir (ZOVIRAX) 400 MG tablet Take 400 mg by mouth daily as needed (for fever blisters).  08/07/15  Yes [provider]  aspirin EC 81 MG EC tablet Take 1 tablet (81 mg total) by mouth daily. 05/21/16  Yes Gouru, Illene Silver, MD  atorvastatin (LIPITOR) 40 MG tablet Take 40 mg by mouth at bedtime.  08/16/14  Yes [provider]  Biotin 10 MG TABS Take 10 mg by mouth daily.    Yes [provider]  Calcium Carb-Cholecalciferol 600-800 MG-UNIT CHEW Chew 1 each by mouth daily.    Yes [provider]  CARTIA XT 120 MG 24 hr capsule Take 120 mg by mouth daily. 12/13/17  Yes [provider]  Cholecalciferol (VITAMIN D3) 1.25 MG (50000 UT) CAPS Take by  mouth daily.   Yes [provider]  clopidogrel (PLAVIX) 75 MG tablet Take 75 mg by mouth daily.   Yes [provider]  ferrous sulfate 325 (65 FE) MG EC tablet Take 325 mg by mouth daily.    Yes [provider]  glipiZIDE (GLUCOTROL) 10 MG tablet Take 10 mg by mouth 2 (two) times daily.   Yes [provider]  HUMALOG KWIKPEN 100 UNIT/ML KiwkPen Inject into the skin 3 (three) times daily as needed (Per sliding scale).  03/02/17  Yes [provider]  insulin glargine (LANTUS) 100 UNIT/ML injection Inject 32 Units into the skin daily. Noon   Yes [provider]  LORazepam (ATIVAN) 0.5 MG tablet Take 0.5 mg by mouth every 8 (eight) hours as needed.    Yes [provider]  losartan (COZAAR) 100 MG tablet Take 100 mg by mouth daily at 12 noon.  02/05/14  Yes [provider]  ondansetron (ZOFRAN ODT) 8 MG disintegrating tablet Take 8 mg by mouth as needed for nausea or vomiting.   Yes [provider]  Probiotic Product (4X PROBIOTIC PO) Take 1 capsule by mouth daily.    Yes [provider]  traZODone (DESYREL) 50 MG tablet Take 50 mg by mouth at bedtime as needed for sleep.   Yes [provider]  vitamin B-12 (CYANOCOBALAMIN) 1000 MCG tablet Take 1,000 mcg by mouth daily.   Yes [provider]    Allergies as of 01/16/2019 - Review Complete 01/11/2019  Allergen Reaction Noted  . Tramadol Swelling 03/11/2016  . Beta adrenergic blockers Hives 11/18/2014  . Carbamazepine Nausea Only 11/30/2018  . Celebrex [celecoxib] Other (See Comments) 11/18/2014  . Gabapentin Other (See Comments) 03/03/2017  . Niacin and related Other (See Comments) 11/18/2014  . Oxycodone Nausea Only 08/26/2015  . Shellfish allergy Nausea And Vomiting 11/18/2014  . Vicodin [hydrocodone-acetaminophen] Other (See Comments) 11/18/2014  . Heparin Rash 12/01/2015    Family History  Problem Relation Age of Onset  . Heart  disease Father   . Diabetes Other   . Breast cancer Neg Hx     Social History   Socioeconomic History  . Marital status: Married    Spouse name: Not on file  . Number of children: Not on file  . Years of education: Not on file  . Highest education level: Not on file  Occupational History  . Not on file  Social Needs  . Financial resource strain: Not on file  . Food insecurity    Worry: Not on file    Inability: Not on file  . Transportation needs    Medical: Not on file    Non-medical: Not on file  Tobacco Use  . Smoking status: Never Smoker  . Smokeless tobacco: Never Used  Substance and Sexual Activity  .  Alcohol use: Yes    Alcohol/week: 0.0 standard drinks    Comment: 2-3 drinks per year  . Drug use: Never  . Sexual activity: Not Currently  Lifestyle  . Physical activity    Days per week: Not on file    Minutes per session: Not on file  . Stress: Not on file  Relationships  . Social Herbalist on phone: Not on file    Gets together: Not on file    Attends religious service: Not on file    Active member of club or organization: Not on file    Attends meetings of clubs or organizations: Not on file    Relationship status: Not on file  . Intimate partner violence    Fear of current or ex partner: Not on file    Emotionally abused: Not on file    Physically abused: Not on file    Forced sexual activity: Not on file  Other Topics Concern  . Not on file  Social History Narrative  . Not on file    Review of Systems: See HPI, otherwise negative ROS  Physical Exam: BP (!) 142/73   Pulse (!) 114   Temp 97.6 F (36.4 C) (Tympanic)   Resp 18   Ht 5' 2.5" (1.588 m)   Wt 83.5 kg   SpO2 100%   BMI 33.12 kg/m  General:   Alert,  pleasant and cooperative in NAD Head:  Normocephalic and atraumatic. Neck:  Supple; no masses or thyromegaly. Lungs:  Clear throughout to auscultation, normal respiratory effort.    Heart:  +S1, +S2, Regular rate and  rhythm, No edema. Abdomen:  Soft, nontender and nondistended. Normal bowel sounds, without guarding, and without rebound.   Neurologic:  Alert and  oriented x4;  grossly normal neurologically.  Impression/Plan: Dorothy Rogers is here for an endoscopy  to be performed for  evaluation of dysphagia    Risks, benefits, limitations, and alternatives regarding endoscopy and dilation have been reviewed with the patient.  Questions have been answered.  All parties agreeable.   Jonathon Bellows, MD  01/25/2019, 8:43 AM

## 2019-01-25 NOTE — Anesthesia Preprocedure Evaluation (Signed)
Anesthesia Evaluation  Patient identified by MRN, date of birth, ID band Patient awake    Reviewed: Allergy & Precautions, H&P , NPO status , Patient's Chart, lab work & pertinent test results  History of Anesthesia Complications Negative for: history of anesthetic complications  Airway Mallampati: III  TM Distance: <3 FB Neck ROM: limited    Dental no notable dental hx. (+) Poor Dentition, Chipped   Pulmonary neg pulmonary ROS, neg shortness of breath,    Pulmonary exam normal breath sounds clear to auscultation       Cardiovascular Exercise Tolerance: Good hypertension, (-) angina+ CAD, + Cardiac Stents and + Peripheral Vascular Disease  (-) Past MI and (-) DOE Normal cardiovascular exam Rhythm:regular Rate:Normal     Neuro/Psych PSYCHIATRIC DISORDERS  Neuromuscular disease    GI/Hepatic Neg liver ROS, hiatal hernia, GERD  Controlled,  Endo/Other  diabetes, Type 2, Insulin Dependent  Renal/GU ESRFRenal disease     Musculoskeletal  (+) Arthritis ,   Abdominal   Peds  Hematology negative hematology ROS (+) anemia ,   Anesthesia Other Findings Past Medical History: No date: Anemia No date: Anemia No date: Arthritis     Comment: lower back, hips No date: Blood transfusion without reported diagnosis FEB AND NOV 2009: CAD (coronary artery disease)     Comment: 6 STENTS No date: Cancer ([place])     Comment: tumor back of neck-fibroushistocytoma No date: Cataract No date: Chronic kidney disease     Comment: Stage III No date: Coronary artery disease No date: DDD (degenerative disc disease), lumbar No date: Diabetes mellitus without complication ([facility])     Comment: TYPE 2 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 No date: Hyperlipidemia No date: Hypertension     Comment: CONTROLLED ON MEDS No date: Vertigo     Comment: occasional, no episodes in 2-3 months No date: Vertigo     Comment: OCCASIONALLY No date: Vitamin B 12  deficiency  Past Surgical History: 02/04/2016: 24 HOUR PH STUDY N/A     Comment: Procedure: 24 HOUR PH STUDY;  Surgeon: Lucilla Lame, MD;  Location: [facility] ENDOSCOPY;  Service:               Endoscopy;  Laterality: N/A; No date: ABDOMINAL HYSTERECTOMY No date: BACK SURGERY     Comment: Lumbar spinal fusion 05/30/2015 2009: CARDIAC CATHETERIZATION No date: CATARACT EXTRACTION Left 03/19/2015: CATARACT EXTRACTION W/PHACO Right     Comment: Procedure: CATARACT EXTRACTION PHACO AND               INTRAOCULAR LENS PLACEMENT ([facility]);  Surgeon:               Leandrew Koyanagi, MD;  Location: [place];  Service: Ophthalmology;                Laterality: Right;  DIABETIC - insulin and oral              meds 03/2008: CORONARY STENT PLACEMENT No date: CYSTECTOMY Right     Comment: wrist 02/04/2016: ESOPHAGEAL MANOMETRY N/A     Comment: Procedure: ESOPHAGEAL MANOMETRY (EM);                Surgeon: Lucilla Lame, MD;  Location: [facility]               ENDOSCOPY;  Service: Endoscopy;  Laterality:  N/A; 09/04/2015: ESOPHAGOGASTRODUODENOSCOPY (EGD) WITH PROPOFOL N/A     Comment: Procedure: ESOPHAGOGASTRODUODENOSCOPY (EGD)               WITH PROPOFOL;  Surgeon: Lucilla Lame, MD;                Location: [place];  Service:               Endoscopy;  Laterality: N/A;  INSULIN DEPENDENT              DIABETIC No date: EYE SURGERY Left     Comment: cataract extraction 2001, 2014, 2015: LUMBAR FUSION No date: SPINE SURGERY     Comment: 2001, 2014, 2015 No date: TUMOR EXCISION     Comment: back of neck  BMI    Body Mass Index:  31.64 kg/m      Reproductive/Obstetrics negative OB ROS                             Anesthesia Physical  Anesthesia Plan  ASA: III  Anesthesia Plan: General   Post-op Pain Management:    Induction:   PONV Risk Score and Plan:   Airway Management Planned: Nasal Cannula  Additional  Equipment:   Intra-op Plan:   Post-operative Plan:   Informed Consent: I have reviewed the patients History and Physical, chart, labs and discussed the procedure including the risks, benefits and alternatives for the proposed anesthesia with the patient or authorized representative who has indicated his/her understanding and acceptance.     Dental Advisory Given  Plan Discussed with: Anesthesiologist, CRNA and Surgeon  Anesthesia Plan Comments:         Anesthesia Quick Evaluation

## 2019-01-25 NOTE — Anesthesia Post-op Follow-up Note (Signed)
Anesthesia QCDR form completed.        

## 2019-01-26 ENCOUNTER — Encounter: Payer: Self-pay | Admitting: Gastroenterology

## 2019-01-26 NOTE — Anesthesia Postprocedure Evaluation (Signed)
Anesthesia Post Note  Patient: Dorothy Rogers  Procedure(s) Performed: ESOPHAGOGASTRODUODENOSCOPY (EGD) WITH PROPOFOL (N/A )  Patient location during evaluation: Endoscopy Anesthesia Type: General Level of consciousness: awake and alert and oriented Pain management: pain level controlled Vital Signs Assessment: post-procedure vital signs reviewed and stable Respiratory status: spontaneous breathing Cardiovascular status: blood pressure returned to baseline Anesthetic complications: no     Last Vitals:  Vitals:   01/25/19 0902 01/25/19 0903  BP: 94/60 94/60  Pulse: (!) 104 (!) 102  Resp: 18 18  Temp: 36.4 C 36.4 C  SpO2: 95% 95%    Last Pain:  Vitals:   01/25/19 0923  TempSrc:   PainSc: 0-No pain                 Alexismarie Flaim

## 2019-02-02 ENCOUNTER — Other Ambulatory Visit: Payer: Self-pay | Admitting: Internal Medicine

## 2019-02-02 DIAGNOSIS — Z1231 Encounter for screening mammogram for malignant neoplasm of breast: Secondary | ICD-10-CM

## 2019-02-20 ENCOUNTER — Ambulatory Visit (INDEPENDENT_AMBULATORY_CARE_PROVIDER_SITE_OTHER): Payer: Medicare Other | Admitting: Gastroenterology

## 2019-02-20 ENCOUNTER — Other Ambulatory Visit: Payer: Self-pay

## 2019-02-20 VITALS — BP 135/66 | HR 77 | Temp 98.3°F | Ht 62.0 in | Wt 187.0 lb

## 2019-02-20 DIAGNOSIS — K3184 Gastroparesis: Secondary | ICD-10-CM | POA: Diagnosis not present

## 2019-02-20 DIAGNOSIS — R159 Full incontinence of feces: Secondary | ICD-10-CM | POA: Diagnosis not present

## 2019-02-20 DIAGNOSIS — N814 Uterovaginal prolapse, unspecified: Secondary | ICD-10-CM

## 2019-02-20 NOTE — Progress Notes (Signed)
Dorothy Bellows MD, MRCP(U.K) [address]  [place]  [place], [place] [address]  Main: [phone]  Fax: [phone]   Primary Care Physician: Idelle Crouch, MD  Primary Gastroenterologist:  Dr. Jonathon Rogers     HPI: Dorothy Rogers is a 68 y.o. female   Summary of history   I was consulted to see her on 05/17/16 for nausea and vomiting when admitted .Marland KitchenEGD in 08/2015 by Dr Allen Norris for nausea/vomiting showed medium sized hiatal hernia, antral gastritis that was negative for H pylori on biopsy .She has had a lap paraesophageal hernia repair with fundoplication in [id number]. Barium swallow with tablet 05/13/16 showedmild narrowing of GE jn with passage of tablet , possible mild wall thickening .She has had prior ER visits for same issues and discharges when it was attributed to anxiety. .At the time of her initial visit she was on Reglan ,had a positive HIDA scan , underwent a cholecystectomy. Continued to have nausea after her cholecystectomy.Gastric emptying study showed only 27% of her stomach was emptied at 4 hour mark which is very abnormal and suggestive of gastroparesis.  Barium swallow on 12/04/2018 showed mild deformity of the cervical esophagus secondary to prominent vertebral endplate osteophytes.  Esophagus is widely patent.  A prominent stricture is noted within the distal esophagus could be postsurgical inflammatory or malignant.  CT chest and abdomen on 12/11/2018 demonstrated small hiatal hernia.  Referred back by Dr Dahlia Byes for EGD to evaluate hiatal hernia   Interval history 12/28/2018-02/20/2019  01/25/2019: Normal esophagus- large qty of food in the stomach - suggested repeat EGD in 2 weeks  She is doing well since her last visit.  Not really having any issues with her gastroparesis.  She has been on a high-fiber diet because she says she has been having bowel incontinence ongoing for a year and a half.  She also mentions she has issues with urination and then mention  to me she has a uterine prolapse that was repaired back many years ago and she feels that is acting up again.  She has never had a colonoscopy and I strongly suggested her to undergo 1.  She said she had the stool test previously and it was negative.     Current Outpatient Medications  Medication Sig Dispense Refill   acetaminophen (TYLENOL) 500 MG tablet Take 1,000 mg by mouth every 6 (six) hours as needed for mild pain or moderate pain.      acyclovir (ZOVIRAX) 400 MG tablet Take 400 mg by mouth daily as needed (for fever blisters).      aspirin EC 81 MG EC tablet Take 1 tablet (81 mg total) by mouth daily.     atorvastatin (LIPITOR) 40 MG tablet Take 40 mg by mouth at bedtime.      Biotin 10 MG TABS Take 10 mg by mouth daily.      Calcium Carb-Cholecalciferol 600-800 MG-UNIT CHEW Chew 1 each by mouth daily.      CARTIA XT 120 MG 24 hr capsule Take 120 mg by mouth daily.  3   Cholecalciferol (VITAMIN D3) 1.25 MG (50000 UT) CAPS Take by mouth daily.     clopidogrel (PLAVIX) 75 MG tablet Take 75 mg by mouth daily.     ferrous sulfate 325 (65 FE) MG EC tablet Take 325 mg by mouth daily.      glipiZIDE (GLUCOTROL) 10 MG tablet Take 10 mg by mouth 2 (two) times daily.     HUMALOG KWIKPEN 100 UNIT/ML  KiwkPen Inject into the skin 3 (three) times daily as needed (Per sliding scale).   2   insulin glargine (LANTUS) 100 UNIT/ML injection Inject 32 Units into the skin daily. Noon     LORazepam (ATIVAN) 0.5 MG tablet Take 0.5 mg by mouth every 8 (eight) hours as needed.      losartan (COZAAR) 100 MG tablet Take 100 mg by mouth daily at 12 noon.      ondansetron (ZOFRAN ODT) 8 MG disintegrating tablet Take 8 mg by mouth as needed for nausea or vomiting.     Probiotic Product (4X PROBIOTIC PO) Take 1 capsule by mouth daily.      traZODone (DESYREL) 50 MG tablet Take 50 mg by mouth at bedtime as needed for sleep.     vitamin B-12 (CYANOCOBALAMIN) 1000 MCG tablet Take 1,000 mcg by  mouth daily.     No current facility-administered medications for this visit.     Allergies as of 02/20/2019 - Review Complete 01/25/2019  Allergen Reaction Noted   Tramadol Swelling 03/11/2016   Beta adrenergic blockers Hives 11/18/2014   Carbamazepine Nausea Only 11/30/2018   Celebrex [celecoxib] Other (See Comments) 11/18/2014   Gabapentin Other (See Comments) 03/03/2017   Niacin and related Other (See Comments) 11/18/2014   Oxycodone Nausea Only 08/26/2015   Shellfish allergy Nausea And Vomiting 11/18/2014   Vicodin [hydrocodone-acetaminophen] Other (See Comments) 11/18/2014   Heparin Rash 12/01/2015    ROS:  General: Negative for anorexia, weight loss, fever, chills, fatigue, weakness. ENT: Negative for hoarseness, difficulty swallowing , nasal congestion. CV: Negative for chest pain, angina, palpitations, dyspnea on exertion, peripheral edema.  Respiratory: Negative for dyspnea at rest, dyspnea on exertion, cough, sputum, wheezing.  GI: See history of present illness. GU:  Negative for dysuria, hematuria, urinary incontinence, urinary frequency, nocturnal urination.  Endo: Negative for unusual weight change.    Physical Examination:   There were no vitals taken for this visit.  General: Well-nourished, well-developed in no acute distress.  Eyes: No icterus. Conjunctivae pink. Mouth: Oropharyngeal mucosa moist and pink , no lesions erythema or exudate. Lungs: Clear to auscultation bilaterally. Non-labored. Heart: Regular rate and rhythm, no murmurs rubs or gallops.  Abdomen: Bowel sounds are normal, nontender, nondistended, no hepatosplenomegaly or masses, no abdominal bruits or hernia , no rebound or guarding.   Extremities: No lower extremity edema. No clubbing or deformities. Neuro: Alert and oriented x 3.  Grossly intact. Skin: Warm and dry, no jaundice.   Psych: Alert and cooperative, normal mood and affect.   Imaging Studies: No results  found.  Assessment and Plan:   Dorothy Rogers is a 68 y.o. y/o female here to follow up for dysphagia.  She  had a barium swallow which showed possible narrowing at the GE junction.    Recent EGD showed a normal esophagus but showed significant amount of food in the stomach impairing visualization strongly suggestive of gastroparesis.  My impression is that the gastroparesis is leading to significant acid reflux and probably secondary dysphagia.  It is also possible that she eats when her stomach is full of food and the food cannot go down further and accumulates in her esophagus.  She does not seem to have any issues from her gastroparesis at this point of time.  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  She also mentioned that she has been consuming a high-fiber diet for bowel incontinence  going on for a year and a half.  It is possible that the high-fiber diet is making her gastroparesis symptoms worse.  She also mentioned she had a uterine prolapse fixed many years ago and now is having further uterine prolapse symptoms including protrusion from her vagina as well as bladder emptying issues.  Plan 1.    Discussed that if she were to have her uterine prolapse fixed it would probably help her with her bowel and bladder symptoms which she has at this point of time.  She would like me to start the referral process to [facility]. 2.  She is not keen on a repeat upper endoscopy at this point of time but would be willing to consider the same along with her colonoscopy which she would like to schedule at her following visit with me in about 2 months.   Dr Dorothy Bellows  MD,MRCP [facility]) Follow up in 2 months

## 2019-02-21 ENCOUNTER — Other Ambulatory Visit: Payer: Self-pay

## 2019-02-21 ENCOUNTER — Ambulatory Visit
Admission: RE | Admit: 2019-02-21 | Discharge: 2019-02-21 | Disposition: A | Discharge status: Home / Self Care | Payer: Medicare Other | Source: Ambulatory Visit | Attending: Internal Medicine | Admitting: Internal Medicine

## 2019-02-21 DIAGNOSIS — Z1231 Encounter for screening mammogram for malignant neoplasm of breast: Secondary | ICD-10-CM | POA: Diagnosis not present

## 2019-02-22 ENCOUNTER — Ambulatory Visit: Payer: Medicare Other | Admitting: Pulmonary Disease

## 2019-02-23 ENCOUNTER — Ambulatory Visit: Payer: Medicare Other | Admitting: Cardiothoracic Surgery

## 2019-03-01 ENCOUNTER — Encounter: Payer: Self-pay | Admitting: Cardiothoracic Surgery

## 2019-03-01 ENCOUNTER — Ambulatory Visit (INDEPENDENT_AMBULATORY_CARE_PROVIDER_SITE_OTHER): Payer: Medicare Other | Admitting: Cardiothoracic Surgery

## 2019-03-01 ENCOUNTER — Telehealth: Payer: Self-pay

## 2019-03-01 ENCOUNTER — Other Ambulatory Visit: Payer: Self-pay

## 2019-03-01 VITALS — BP 130/80 | HR 89 | Temp 97.9°F | Resp 14 | Ht 62.5 in | Wt 186.6 lb

## 2019-03-01 DIAGNOSIS — K449 Diaphragmatic hernia without obstruction or gangrene: Secondary | ICD-10-CM | POA: Diagnosis not present

## 2019-03-01 NOTE — Progress Notes (Signed)
Patient ID: Dorothy Rogers, female   DOB: 1950-06-03, 68 y.o.   MRN: [id number]  Chief Complaint  Patient presents with  . Follow-up    Diaphragmatic Plication / Hernia    Referred By Dr. Fulton Reek Reason for Referral shortness of breath  HPI Location, Quality, Duration, Severity, Timing, Context, Modifying Factors, Associated Signs and Symptoms.  Dorothy Rogers is a 68 y.o. female.  Her problems actually began several years ago when she had a significant reflux type symptoms and had a large paraesophageal hernia repaired laparoscopically.  At the time of her original surgery the majority of her stomach was in her left chest and in the mediastinum and this was translocated back into the abdomen and a primary repair was completed.  She did well for about a year and a half or so but then developed progressive shortness of breath and has undergone multiple work-ups for this including a complete cardiac evaluation and pulmonary medicine evaluation.  She is also had an endoscopy and has been seen by almost all the specialist here in our community for her progressive shortness of breath.  She comes in today to elicit my opinion regarding repair of her diaphragmatic hernia and its subsequent consequences.  She recently saw 1 of our pulmonologist and a sniff test was performed revealing normal functioning of the right hemidiaphragm but diminished function of the left hemidiaphragm.  Specifically there was no evidence of paradoxical movement of the diaphragm.  The patient states that she is short of breath and that it significantly impacts her lifestyle.  She is unable to walk more than 10 to 15 feet before she feels short of breath.  She notices that lying flat actually improves her breathing whereas sitting upright or standing actually makes her breathing less well.  I am not sure how to explain that but that was her description of her problems.  She has been seen by her cardiologist her pulmonologist  and her primary care physician without any significant therapeutic benefit to any intervention.  The patient was given the name of Dr. Lerry Paterson at [facility] as someone who may be able to assist in her evaluation and I certainly echo that sentiment.   Past Medical History:  Diagnosis Date  . Anemia   . Anemia   . Arthritis    lower back, hips  . Blood transfusion without reported diagnosis   . CAD (coronary artery disease) FEB AND NOV 2009   6 STENTS  . 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 Cataract   . Chronic kidney disease    Stage III  . Coronary artery disease   . DDD (degenerative disc disease), lumbar   . Diabetes mellitus without complication ([place])    TYPE 2  . Herpes simplex   . Hyperlipidemia   . Hypertension    CONTROLLED ON MEDS  . Vertigo    occasional, no episodes in 2-3 months  . Vertigo    OCCASIONALLY  . Vitamin B 12 deficiency     Past Surgical History:  Procedure Laterality Date  . [place] STUDY N/A 02/04/2016   Procedure: [place] STUDY;  Surgeon: Lucilla Lame, MD;  Location: [facility] ENDOSCOPY;  Service: Endoscopy;  Laterality: N/A;  . ABDOMINAL HYSTERECTOMY    . BACK SURGERY     Lumbar spinal fusion 05/30/2015  . CARDIAC CATHETERIZATION  2009  . CATARACT EXTRACTION Left   . CATARACT EXTRACTION W/PHACO Right 03/19/2015  Procedure: CATARACT EXTRACTION PHACO AND INTRAOCULAR LENS PLACEMENT (IOC);  Surgeon: Leandrew Koyanagi, MD;  Location: [facility];  Service: Ophthalmology;  Laterality: Right;  DIABETIC - insulin and oral meds  . CHOLECYSTECTOMY N/A 05/19/2016   Procedure: LAPAROSCOPIC CHOLECYSTECTOMY;  Surgeon: Clayburn Pert, MD;  Location: [facility] ORS;  Service: General;  Laterality: N/A;  . CORONARY STENT PLACEMENT  03/2008  . CYSTECTOMY Right    wrist  . ESOPHAGEAL MANOMETRY N/A 02/04/2016   Procedure: ESOPHAGEAL MANOMETRY (EM);  Surgeon: Lucilla Lame, MD;  Location: [facility] ENDOSCOPY;  Service: Endoscopy;   Laterality: N/A;  . ESOPHAGOGASTRODUODENOSCOPY (EGD) WITH PROPOFOL N/A 09/04/2015   Procedure: ESOPHAGOGASTRODUODENOSCOPY (EGD) WITH PROPOFOL;  Surgeon: Lucilla Lame, MD;  Location: [facility];  Service: Endoscopy;  Laterality: N/A;  INSULIN DEPENDENT DIABETIC  . ESOPHAGOGASTRODUODENOSCOPY (EGD) WITH PROPOFOL N/A 06/29/2016   Procedure: ESOPHAGOGASTRODUODENOSCOPY (EGD) WITH PROPOFOL;  Surgeon: Jonathon Bellows, MD;  Location: [facility] ENDOSCOPY;  Service: Endoscopy;  Laterality: N/A;  . ESOPHAGOGASTRODUODENOSCOPY (EGD) WITH PROPOFOL N/A 01/25/2019   Procedure: ESOPHAGOGASTRODUODENOSCOPY (EGD) WITH PROPOFOL;  Surgeon: Jonathon Bellows, MD;  Location[facility] ENDOSCOPY;  Service: Gastroenterology;  Laterality: N/A;  . EYE SURGERY Left    cataract extraction  . HIATAL HERNIA REPAIR  02/2016   Dr Adonis Huguenin  . [place] CATH AND CORONARY ANGIOGRAPHY Left 08/18/2017   Procedure: LEFT HEART CATH AND CORONARY ANGIOGRAPHY;  Surgeon: Teodoro Spray, MD;  Location: [place] [facility] CV LAB;  Service: Cardiovascular;  Laterality: Left;  . LUMBAR FUSION  2001, 2014, 2015  . [place]     2001, 2014, 2015  . TUMOR EXCISION     back of neck    Family History  Problem Relation Age of Onset  . Heart disease Father   . Diabetes Other   . Breast cancer Neg Hx     Social History Social History   Tobacco Use  . Smoking status: Never Smoker  . Smokeless tobacco: Never Used  Substance Use Topics  . Alcohol use: Yes    Alcohol/week: 0.0 standard drinks    Comment: 2-3 drinks per year  . Drug use: Never    Allergies  Allergen Reactions  . Tramadol Swelling  . Beta Adrenergic Blockers Hives  . Carbamazepine Nausea Only  . Celebrex [Celecoxib] Other (See Comments)    A lot of pressure in head  . Gabapentin Other (See Comments)    Dizziness, arms weak, high dose only  . Niacin And Related Other (See Comments)    Flushing and hot sensation  . Oxycodone Nausea Only  . Shellfish Allergy Nausea And Vomiting  .  Vicodin [Hydrocodone-Acetaminophen] Other (See Comments)    Hypoglycemic episodes  . Heparin Rash    Current Outpatient Medications  Medication Sig Dispense Refill  . acetaminophen (TYLENOL) 500 MG tablet Take 1,000 mg by mouth every 6 (six) hours as needed for mild pain or moderate pain.     Marland Kitchen acyclovir (ZOVIRAX) 400 MG tablet Take 400 mg by mouth daily as needed (for fever blisters).     Marland Kitchen aspirin EC 81 MG EC tablet Take 1 tablet (81 mg total) by mouth daily.    Marland Kitchen atorvastatin (LIPITOR) 40 MG tablet Take 40 mg by mouth at bedtime.     . Biotin 10 MG TABS Take 10 mg by mouth daily.     . Calcium Carb-Cholecalciferol 600-800 MG-UNIT CHEW Chew 1 each by mouth daily.     Marland Kitchen CARTIA XT 120 MG 24 hr capsule Take 120 mg by  mouth daily.  3  . Cholecalciferol (VITAMIN D3) 1.25 MG (50000 UT) CAPS Take by mouth daily.    . clopidogrel (PLAVIX) 75 MG tablet Take 75 mg by mouth daily.    . ferrous sulfate 325 (65 FE) MG EC tablet Take 325 mg by mouth daily.     Marland Kitchen glipiZIDE (GLUCOTROL) 10 MG tablet Take 10 mg by mouth 2 (two) times daily.    Marland Kitchen HUMALOG KWIKPEN 100 UNIT/ML KiwkPen Inject into the skin 3 (three) times daily as needed (Per sliding scale).   2  . insulin glargine (LANTUS) 100 UNIT/ML injection Inject 32 Units into the skin daily. Noon    . LORazepam (ATIVAN) 0.5 MG tablet Take 0.5 mg by mouth every 8 (eight) hours as needed.     Marland Kitchen losartan (COZAAR) 100 MG tablet Take 100 mg by mouth daily at 12 noon.     . ondansetron (ZOFRAN ODT) 8 MG disintegrating tablet Take 8 mg by mouth as needed for nausea or vomiting.    . Probiotic Product (4X PROBIOTIC PO) Take 1 capsule by mouth daily.     . traZODone (DESYREL) 50 MG tablet Take 50 mg by mouth at bedtime as needed for sleep.    . vitamin B-12 (CYANOCOBALAMIN) 1000 MCG tablet Take 1,000 mcg by mouth daily.     No current facility-administered medications for this visit.       Review of Systems A complete review of systems was asked and was  negative except for the following positive findings significant musculoskeletal pain secondary to her multiple back issues.  Blood pressure 130/80, pulse 89, temperature 97.9 F (36.6 C), temperature source Temporal, resp. rate 14, height 5' 2.5" (1.588 m), weight 186 lb 9.6 oz (84.6 kg), SpO2 99 %.  Physical Exam CONSTITUTIONAL:  Pleasant, well-developed, well-nourished, and in no acute distress. EYES: Pupils equal and reactive to light, Sclera non-icteric EARS, NOSE, MOUTH AND THROAT:  The oropharynx was clear.  Dentition is good repair.  Oral mucosa pink and moist. LYMPH NODES:  Lymph nodes in the neck and axillae were normal RESPIRATORY:  Lungs were clear.  Normal respiratory effort without pathologic use of accessory muscles of respiration CARDIOVASCULAR: Heart was regular without murmurs.  There were no carotid bruits. GI: The abdomen was soft, nontender, and nondistended. There were no palpable masses. There was no hepatosplenomegaly. There were normal bowel sounds in all quadrants. GU:  Rectal deferred.   MUSCULOSKELETAL:  Normal muscle strength and tone.  No clubbing or cyanosis.   SKIN:  There were no pathologic skin lesions.  There were no nodules on palpation. NEUROLOGIC:  Sensation is normal.  Cranial nerves are grossly intact. PSYCH:  Oriented to person, place and time.  Mood and affect are normal.  Data Reviewed Multiple CT scans chest x-rays and barium swallows  I have personally reviewed the patient's imaging, laboratory findings and medical records.    Assessment    Left hemidiaphragm dysfunction    Plan    I had a long discussion with her and her husband today regarding the options.  I do not see any obvious lesion in her neck or mediastinum.  I believe that her problems began with her original paraesophageal hernia although she was asymptomatic for 12 to 18 months after her surgery.  I am not entirely sure how to best help her shortness of breath as I am not clear  as to the etiology.  Her pulmonary function studies did not reveal any significant insights and  her endoscopy, upper GI, sniff test and CT scans did not reveal any absolute rationale for her shortness of breath.  Therefore I have encouraged her to seek another opinion with Dr. Otilio Connors at [facility].  I encouraged him to get all the records gathered prior to their visit there.  They will do so.  All their questions were answered to the best my ability.       Nestor Lewandowsky, MD 03/01/2019, 12:08 PM

## 2019-03-01 NOTE — Telephone Encounter (Signed)
Patient notified that we have sent over a referral to Dr Guadalupe Dawn at [facility]. They will contact the patient to schedule an appointment. [place]: [phone] Fax: [phone]

## 2019-03-01 NOTE — Patient Instructions (Signed)
We will sent referral over to [facility]. They will contact you to schedule this appointment.   Follow-up with our office as needed.  Please call and ask to speak with a nurse if you develop questions or concerns.

## 2019-03-02 ENCOUNTER — Ambulatory Visit: Payer: Medicare Other | Admitting: Cardiothoracic Surgery

## 2019-03-12 ENCOUNTER — Telehealth: Payer: Self-pay

## 2019-03-12 NOTE — Telephone Encounter (Signed)
Patient has scheduled appointment with Dr.Amico on 03/20/19 per his nurse.

## 2019-04-23 ENCOUNTER — Ambulatory Visit: Payer: Medicare Other | Admitting: Gastroenterology

## 2019-05-07 ENCOUNTER — Other Ambulatory Visit: Payer: Self-pay

## 2019-05-07 ENCOUNTER — Other Ambulatory Visit
Admission: RE | Admit: 2019-05-07 | Discharge: 2019-05-07 | Disposition: A | Discharge status: Home / Self Care | Payer: Medicare Other | Source: Ambulatory Visit | Attending: Cardiology | Admitting: Cardiology

## 2019-05-07 DIAGNOSIS — Z01812 Encounter for preprocedural laboratory examination: Secondary | ICD-10-CM | POA: Diagnosis present

## 2019-05-07 DIAGNOSIS — Z20828 Contact with and (suspected) exposure to other viral communicable diseases: Secondary | ICD-10-CM | POA: Diagnosis not present

## 2019-05-08 LAB — SARS CORONAVIRUS 2 (TAT 6-24 HRS): SARS Coronavirus 2: NEGATIVE

## 2019-05-09 ENCOUNTER — Other Ambulatory Visit: Payer: Self-pay | Admitting: Cardiology

## 2019-05-09 MED ORDER — SODIUM CHLORIDE 0.9% FLUSH: 3.0000 mL | Freq: Two times a day (BID) | INTRAVENOUS | Status: AC

## 2019-05-10 ENCOUNTER — Encounter
Admission: RE | Disposition: A | Discharge status: Home / Self Care | Payer: Self-pay | Source: Home / Self Care | Attending: Cardiology

## 2019-05-10 ENCOUNTER — Encounter: Payer: Self-pay | Admitting: Cardiology

## 2019-05-10 ENCOUNTER — Other Ambulatory Visit: Payer: Self-pay

## 2019-05-10 ENCOUNTER — Ambulatory Visit
Admission: RE | Admit: 2019-05-10 | Discharge: 2019-05-10 | Disposition: A | Discharge status: Home / Self Care | Payer: Medicare Other | Attending: Cardiology | Admitting: Cardiology

## 2019-05-10 DIAGNOSIS — G4733 Obstructive sleep apnea (adult) (pediatric): Secondary | ICD-10-CM | POA: Diagnosis not present

## 2019-05-10 DIAGNOSIS — Z794 Long term (current) use of insulin: Secondary | ICD-10-CM | POA: Diagnosis not present

## 2019-05-10 DIAGNOSIS — Z885 Allergy status to narcotic agent status: Secondary | ICD-10-CM | POA: Insufficient documentation

## 2019-05-10 DIAGNOSIS — E1143 Type 2 diabetes mellitus with diabetic autonomic (poly)neuropathy: Secondary | ICD-10-CM | POA: Insufficient documentation

## 2019-05-10 DIAGNOSIS — D649 Anemia, unspecified: Secondary | ICD-10-CM | POA: Diagnosis not present

## 2019-05-10 DIAGNOSIS — E1151 Type 2 diabetes mellitus with diabetic peripheral angiopathy without gangrene: Secondary | ICD-10-CM | POA: Insufficient documentation

## 2019-05-10 DIAGNOSIS — J986 Disorders of diaphragm: Secondary | ICD-10-CM | POA: Insufficient documentation

## 2019-05-10 DIAGNOSIS — I272 Pulmonary hypertension, unspecified: Secondary | ICD-10-CM

## 2019-05-10 DIAGNOSIS — I129 Hypertensive chronic kidney disease with stage 1 through stage 4 chronic kidney disease, or unspecified chronic kidney disease: Secondary | ICD-10-CM | POA: Insufficient documentation

## 2019-05-10 DIAGNOSIS — E782 Mixed hyperlipidemia: Secondary | ICD-10-CM | POA: Diagnosis not present

## 2019-05-10 DIAGNOSIS — Z888 Allergy status to other drugs, medicaments and biological substances status: Secondary | ICD-10-CM | POA: Insufficient documentation

## 2019-05-10 DIAGNOSIS — Z7902 Long term (current) use of antithrombotics/antiplatelets: Secondary | ICD-10-CM | POA: Diagnosis not present

## 2019-05-10 DIAGNOSIS — N183 Chronic kidney disease, stage 3 unspecified: Secondary | ICD-10-CM | POA: Diagnosis not present

## 2019-05-10 DIAGNOSIS — I2510 Atherosclerotic heart disease of native coronary artery without angina pectoris: Secondary | ICD-10-CM | POA: Insufficient documentation

## 2019-05-10 DIAGNOSIS — E1122 Type 2 diabetes mellitus with diabetic chronic kidney disease: Secondary | ICD-10-CM | POA: Insufficient documentation

## 2019-05-10 DIAGNOSIS — K3184 Gastroparesis: Secondary | ICD-10-CM | POA: Diagnosis not present

## 2019-05-10 DIAGNOSIS — Z7982 Long term (current) use of aspirin: Secondary | ICD-10-CM | POA: Diagnosis not present

## 2019-05-10 DIAGNOSIS — Z955 Presence of coronary angioplasty implant and graft: Secondary | ICD-10-CM | POA: Insufficient documentation

## 2019-05-10 DIAGNOSIS — Z8249 Family history of ischemic heart disease and other diseases of the circulatory system: Secondary | ICD-10-CM | POA: Insufficient documentation

## 2019-05-10 DIAGNOSIS — Z79899 Other long term (current) drug therapy: Secondary | ICD-10-CM | POA: Insufficient documentation

## 2019-05-10 DIAGNOSIS — Z886 Allergy status to analgesic agent status: Secondary | ICD-10-CM | POA: Diagnosis not present

## 2019-05-10 HISTORY — DX: Pulmonary hypertension, unspecified: I27.20

## 2019-05-10 HISTORY — PX: RIGHT HEART CATH: CATH118263

## 2019-05-10 LAB — GLUCOSE, CAPILLARY: Glucose-Capillary: 172 mg/dL — ABNORMAL HIGH (ref 70–99)

## 2019-05-10 SURGERY — RIGHT HEART CATH
Anesthesia: Moderate Sedation | Laterality: Right

## 2019-05-10 MED ORDER — MIDAZOLAM HCL 2 MG/2ML IJ SOLN
INTRAMUSCULAR | Status: AC
  Filled 2019-05-10: qty 2

## 2019-05-10 MED ORDER — ASPIRIN 81 MG PO CHEW
81.0000 mg | CHEWABLE_TABLET | ORAL | Status: AC
  Administered 2019-05-10: 81 mg via ORAL

## 2019-05-10 MED ORDER — SODIUM CHLORIDE 0.9 % WEIGHT BASED INFUSION: 1.0000 mL/kg/h | INTRAVENOUS | Status: DC

## 2019-05-10 MED ORDER — HEPARIN (PORCINE) IN NACL 1000-0.9 UT/500ML-% IV SOLN
INTRAVENOUS | Status: AC
  Filled 2019-05-10: qty 1000

## 2019-05-10 MED ORDER — LABETALOL HCL 5 MG/ML IV SOLN: 10.0000 mg | INTRAVENOUS | Status: DC | PRN

## 2019-05-10 MED ORDER — FENTANYL CITRATE (PF) 100 MCG/2ML IJ SOLN
INTRAMUSCULAR | Status: AC
  Filled 2019-05-10: qty 2

## 2019-05-10 MED ORDER — SODIUM CHLORIDE 0.9 % IV SOLN: INTRAVENOUS | Status: DC

## 2019-05-10 MED ORDER — SODIUM CHLORIDE 0.9% FLUSH: 3.0000 mL | Freq: Two times a day (BID) | INTRAVENOUS | Status: DC

## 2019-05-10 MED ORDER — SODIUM CHLORIDE 0.9 % IV SOLN: 250.0000 mL | INTRAVENOUS | Status: DC | PRN

## 2019-05-10 MED ORDER — SODIUM CHLORIDE 0.9% FLUSH: 3.0000 mL | INTRAVENOUS | Status: DC | PRN

## 2019-05-10 MED ORDER — HYDRALAZINE HCL 20 MG/ML IJ SOLN: 10.0000 mg | INTRAMUSCULAR | Status: DC | PRN

## 2019-05-10 MED ORDER — MIDAZOLAM HCL 2 MG/2ML IJ SOLN
INTRAMUSCULAR | Status: DC | PRN
  Administered 2019-05-10: 1 mg via INTRAVENOUS

## 2019-05-10 MED ORDER — ONDANSETRON HCL 4 MG/2ML IJ SOLN: 4.0000 mg | Freq: Four times a day (QID) | INTRAMUSCULAR | Status: DC | PRN

## 2019-05-10 MED ORDER — FENTANYL CITRATE (PF) 100 MCG/2ML IJ SOLN
INTRAMUSCULAR | Status: DC | PRN
  Administered 2019-05-10: 25 ug via INTRAVENOUS

## 2019-05-10 MED ORDER — ACETAMINOPHEN 325 MG PO TABS: 650.0000 mg | ORAL_TABLET | ORAL | Status: DC | PRN

## 2019-05-10 MED ORDER — SODIUM CHLORIDE 0.9 % IV SOLN
INTRAVENOUS | Status: AC | PRN
  Administered 2019-05-10: 100 mL

## 2019-05-10 MED ORDER — SODIUM CHLORIDE 0.9 % WEIGHT BASED INFUSION
3.0000 mL/kg/h | INTRAVENOUS | Status: AC
  Administered 2019-05-10: 3 mL/kg/h via INTRAVENOUS

## 2019-05-10 MED ORDER — ASPIRIN 81 MG PO CHEW
CHEWABLE_TABLET | ORAL | Status: AC
  Filled 2019-05-10: qty 1

## 2019-05-10 SURGICAL SUPPLY — 11 items
CATH SWANZ 7F THERMO (CATHETERS) ×4 IMPLANT
DEVICE INFLAT 30 PLUS (MISCELLANEOUS) IMPLANT
GLIDESHEATH SLEND SS 6F .021 (SHEATH) IMPLANT
GUIDEWIRE EMER 3M J .025X150CM (WIRE) ×4 IMPLANT
KIT MANI 3VAL PERCEP (MISCELLANEOUS) ×4 IMPLANT
NEEDLE PERC 18GX7CM (NEEDLE) ×4 IMPLANT
PACK CARDIAC CATH (CUSTOM PROCEDURE TRAY) ×4 IMPLANT
SHEATH AVANTI 5FR X 11CM (SHEATH) IMPLANT
SHEATH AVANTI 7FRX11 (SHEATH) ×4 IMPLANT
WIRE GUIDERIGHT .035X150 (WIRE) ×4 IMPLANT
WIRE ROSEN-J .035X260CM (WIRE) IMPLANT

## 2019-05-10 NOTE — H&P (Signed)
Chief Complaint: Chief Complaint  Patient presents with  . 3 month follow up  set up right heart cath per pulmonology  Date of Service: 04/23/2019 Date of Birth: 1950/10/12 PCP: Idelle Crouch, MD  History of Present Illness: Dorothy Rogers is a 68 y.o.female patient who returns for evaluation of her persistent shortness of breath. This is been present for quite some time. Patient has a history of coronary artery disease. She has had chest pain and she underwent extensive evaluation at [facility] including left heart cath which revealed patent stents and no progression of her disease. Remainder of her noninvasive evaluation resulted with a negative workup. Holter monitor was unremarkable. She is now noting worsening exertional chest pain with episodes of shortness of breath. Repeat cardiac catheterization done in March 2019 revealed patent stents. Her symptoms persisted and she underwent cardiac MRI. This revealed no ischemia. No wall motion abnormality. Continues to have shortness of breath with any activity.. These episodes are extremely debilitating. Extensive evaluation of cardiac function has not revealed the etiology of her symptoms. She also has been evaluated by pulmonary who does not feel that she has any significant pulmonary abnormalities.. She did not qualify for pulmonary rehab. She still has severe shortness of breath. She states she is short of breath sitting in our office although has approximate symmetry of 99% with no wheezing. She has no tachypnea. She has seen rheumatology. She has an elevated sed rate and does have some arthritic changes in her hip. She continues to be short of breath. 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 Echo also showed possible mild diastolic dysfunction. She has normal LV function. She evaluated by 2 separate thoracic surgeons regarding her diaphragmatic dysfunction and felt not to be a candidate for  intervention. She decrement in the DLCO is consistent with pulmonary arterial hypertension. She is now referred for consideration for right heart cath. Past Medical and Surgical History  Past Medical History Past Medical History:  Diagnosis Date  . Anemia  . Chronic pain  . Coronary artery disease  stents x 6  . Degenerative disk disease  . Diabetes mellitus type 2, uncomplicated (CMS-HCC)  . Encounter for blood transfusion  no reaction  . History of degenerative disc disease  . Hyperlipemia  . Hypertension  . Kidney disease  . Malignant fibrous histiocytoma (CMS-HCC)  . OSA (obstructive sleep apnea) 12/07/2017  . Peripheral neuropathy  . Peripheral vascular disease (CMS-HCC)   Past Surgical History She has a past surgical history that includes Back surgery; transforaminal lumbar interbody fusion minimally invasive; Bone graft hip iliac crest; fibrohistiocytoma excision (1986); Hysterectomy; Tooth extraction (01/2013); cyst removed; posterior lumbar spine fusion one level (Bilateral, 03/27/2013); instrumentation posterior spine 1/2 vertebral segments w/o fixation (Bilateral, 03/27/2013); laminectomy posterior lumbar facetectomy & foraminotomy w/decomp (Bilateral, 03/27/2013); laminectomy posterior cervicle decomp w/facetectomy & foraminotomy (Bilateral, 03/27/2013); exploration spinal fusion (Bilateral, 03/27/2013); autograft structural icbg obtained separate incision for spine surgery (Right, 03/27/2013); laminectomy lumbar excison/evacuation extradural intraspinal lesion (Bilateral, 03/27/2013); PTCA with stent placement x6; Cataract extraction (Left, 11/14/13); posterior lumbar spine fusion one level (Bilateral, 05/09/2014); instrumentation posterior spine 1/2 vertebral segments w/o fixation (Bilateral, 05/09/2014); laminotomy reexploration posterior lumbar w/nerve decomp & discectomy (Bilateral, 05/09/2014); laminotomy reexploration posterior lumbar w/nerve decomp & discectomy (Bilateral,  05/09/2014); exploration spinal fusion (Bilateral, 05/09/2014); removal posterior segmental lumbar/thoracic spinal hardware (Bilateral, 05/09/2014); autograft structural icbg obtained separate incision for spine surgery (Bilateral, 05/09/2014); Eye surgery (Left, 2015); Eye surgery (  Right, 03/2015); posterior lumbar spine fusion one level lateral transverse technique (Bilateral, 05/30/2015); instrumentation posterior spine 3 to 6 vertebral segments (Bilateral, 05/30/2015); laminotomy reexploration posterior lumbar w/nerve decomp & discectomy (Bilateral, 05/30/2015); laminotomy reexploration posterior lumbar w/nerve decomp & discectomy (Bilateral, 05/30/2015); removal posterior segmental lumbar/thoracic spinal hardware (Bilateral, 05/30/2015); exploration spinal fusion (Bilateral, 05/30/2015); autograft structural icbg obtained separate incision for spine surgery (Left, 05/30/2015); repair hiatal hernia (02/2016); Hernia repair; Cholecystectomy (04/2016); posterior lumbar spine fusion one level lateral transverse technique (Bilateral, 01/21/2017); instrumentation posterior spine 3 to 6 vertebral segments (Bilateral, 01/21/2017); laminectomy posterior lumbar facetectomy & foraminotomy w/decomp (Bilateral, 01/21/2017); laminectomy posterior cervicle decomp w/facetectomy & foraminotomy (Bilateral, 01/21/2017); exploration spinal fusion (Bilateral, 01/21/2017); removal posterior segmental lumbar/thoracic spinal hardware (Bilateral, 01/21/2017); insertion morselized bone allograft for spine surgery (N/A, 01/21/2017); and Colporrhaphy For Repair Cystocele Anterior.   Medications and Allergies  Current Medications  Current Outpatient Medications  Medication Sig Dispense Refill  . acetaminophen (TYLENOL) 500 MG tablet Take 500 mg by mouth every 6 (six) hours as needed for Pain.  Marland Kitchen acyclovir (ZOVIRAX) 400 MG tablet Take 1 tablet by mouth once daily 30 tablet 0  . aspirin 81 MG EC tablet Take 81 mg by mouth once daily  .  atorvastatin (LIPITOR) 40 MG tablet Take 1 tablet by mouth once daily 90 tablet 0  . B infantis/B ani/B lon/B bifid (PROBIOTIC 4X ORAL) Take 1 capsule by mouth once daily.  . biotin 10 mg Tab Take 10 mg by mouth once daily. Biotin with zinc   . calcium carbonate-vitamin D3 (CALTRATE 600+D) 600 mg(1,500mg ) -400 unit tablet Take 1 tablet by mouth once daily.  . carBAMazepine (TEGRETOL) 100 mg chewable tablet Take 1 tablet (100 mg total) by mouth 2 (two) times daily for 30 days 60 tablet 5  . cholecalciferol (CHOLECALCIFEROL) 1,000 unit tablet Take 5,000 Units by mouth  . clopidogreL (PLAVIX) 75 mg tablet Take 1 tablet by mouth once daily 90 tablet 0  . conjugated estrogens (PREMARIN) 0.625 mg/gram vaginal cream Place 0.5 g vaginally twice a week 42.5 g 12  . cyanocobalamin (VITAMIN B12) 1000 MCG tablet Take 1,000 mcg by mouth once daily.   Marland Kitchen diltiazem (CARDIZEM CD) 120 MG XR capsule Take 1 capsule by mouth once daily 90 capsule 1  . DULoxetine (CYMBALTA) 30 MG DR capsule Take 1 capsule (30 mg total) by mouth once daily 30 capsule 2  . ferrous sulfate 325 (65 FE) MG tablet Take 325 mg by mouth daily with breakfast.  . glipiZIDE (GLUCOTROL) 10 MG tablet TAKE 1 TABLET BY MOUTH TWICE DAILY BEFORE MEAL(S) 180 tablet 0  . insulin GLARGINE (LANTUS SOLOSTAR U-100 INSULIN) pen injector (concentration 100 units/mL) INJECT 36 TO 50 UNITS SUBCUTANEOUSLY ONCE DAILY AS DIRECTED 30 mL 0  . insulin LISPRO (HUMALOG) injection (concentration 100 units/mL) Inject 0-15 Units subcutaneously 3 (three) times daily with meals If BG is >200 then she will take PRN Humalog 15 mL 3  . loperamide (IMODIUM) 2 mg capsule Take 2 mg by mouth 4 (four) times daily as needed for Diarrhea  . LORazepam (ATIVAN) 1 MG tablet TAKE 1 TABLET BY MOUTH EVERY 8 HOURS AS NEEDED FOR ANXIETY 30 tablet 0  . losartan (COZAAR) 100 MG tablet Take 1 tablet by mouth once daily 90 tablet 0  . melatonin 1 mg tablet Take by mouth  . ondansetron  (ZOFRAN-ODT) 8 MG disintegrating tablet Take 8 mg by mouth every 8 (eight) hours as needed for Nausea.  . pen needle, diabetic  32 gauge x 5/32" Ndle USE AS DIRECTED 150 each 3  . tiZANidine (ZANAFLEX) 4 MG tablet Take 1 tablet (4 mg total) by mouth 3 (three) times daily 30 tablet 0  . traZODone (DESYREL) 50 MG tablet Take 1 tablet (50 mg total) by mouth nightly 30 tablet 11   No current facility-administered medications for this visit.   Allergies: Shellfish containing products, Beta-blockers (beta-adrenergic blocking agts), Carbamazepine, Celebrex [celecoxib], Gabapentin, Indomethacin, Niacin, Oxycodone, Tramadol, Vicodin [hydrocodone-acetaminophen], and Heparin analogues  Social and Family History  Social History reports that she has never smoked. She has never used smokeless tobacco. She reports current alcohol use. She reports that she does not use drugs.  Family History Family History  Problem Relation Age of Onset  . Coronary Artery Disease (Blocked arteries around heart) Father  . Myocardial Infarction (Heart attack) Father  . Arthritis Mother  . Cancer Mother  . Thyroid disease Sister  . Arthritis Sister  . Asthma Brother  . Diabetes Maternal Grandmother   Review of Systems  Review of Systems  Constitutional: Negative for chills, diaphoresis, fever, malaise/fatigue and weight loss.  HENT: Negative for congestion, ear discharge, hearing loss and tinnitus.  Eyes: Negative for blurred vision.  Respiratory: Positive for shortness of breath. Negative for cough, hemoptysis, sputum production and wheezing.  Cardiovascular: Negative for chest pain, orthopnea, claudication and PND.  Gastrointestinal: Negative for abdominal pain, blood in stool, constipation, diarrhea, heartburn and melena.  Genitourinary: Negative for dysuria, frequency, hematuria and urgency.  Musculoskeletal: Positive for back pain. Negative for falls, joint pain and myalgias.  Skin: Negative for itching and rash.   Neurological: Negative for dizziness, tingling, focal weakness, loss of consciousness, weakness and headaches.  Endo/Heme/Allergies: Negative for polydipsia. Does not bruise/bleed easily.  Psychiatric/Behavioral: Negative for depression, memory loss and substance abuse. The patient is not nervous/anxious.    Physical Examination   Vitals: BP 150/74  Pulse 109  Resp 16  Ht 157.5 cm (5\' 2" )  Wt 83.9 kg (185 lb)  LMP (LMP Unknown)  SpO2 99%  BMI 33.84 kg/m  Ht:157.5 cm (5\' 2" ) Wt:83.9 kg (185 lb) [id number] surface area is 1.92 meters squared. Body mass index is 33.84 kg/m.  Wt Readings from Last 3 Encounters:  04/23/19 83.9 kg (185 lb)  04/11/19 83.5 kg (184 lb)  04/03/19 82.1 kg (181 lb)   BP Readings from Last 3 Encounters:  04/23/19 150/74  04/11/19 159/77  04/03/19 (!) 182/80   General appearance appears in no acute distress  Head Mouth and Eye exam Normocephalic, without obvious abnormality, atraumatic Dentition is good Eyes appear anicteric   LUNGS Breath Sounds: Normal Percussion: Normal  CARDIOVASCULAR JVP CV wave: no HJR: no Elevation at 90 degrees: None Carotid Pulse: normal pulsation bilaterally Bruit: None Apex: apical impulse normal  Auscultation Rhythm: normal sinus rhythm S1: normal S2: normal Clicks: no Rub: no Murmurs: no murmurs  Gallop: None ABDOMEN Liver enlargement: no Pulsatile aorta: no Ascites: no Bruits: no   EXTREMITIES Clubbing: no Edema: 1+ bilateral pedal edema Pulses: peripheral pulses symmetrical Femoral Bruits: no Amputation: no SKIN Rash: no Cyanosis: no Embolic phemonenon: no Bruising: no NEURO Alert and Oriented to person, place and time: yes Non focal: yes  PSYCH: Pt appears to have normal affect  LABS REVIEWED Last 3 CBC results: Lab Results  Component Value Date  WBC 9.2 02/13/2019  WBC 7.8 11/07/2018  WBC 11.5 (H) 08/03/2018   Lab Results  Component Value Date  HGB 11.4 (L) 02/13/2019   HGB  10.7 (L) 11/07/2018  HGB 11.4 (L) 08/03/2018   Lab Results  Component Value Date  HCT 34.5 (L) 02/13/2019  HCT 32.7 (L) 11/07/2018  HCT 35.1 08/03/2018   Lab Results  Component Value Date  PLT 211 02/13/2019  PLT 202 11/07/2018  PLT 214 08/03/2018   Lab Results  Component Value Date  CREATININE 1.7 (H) 02/13/2019  BUN 30 (H) 02/13/2019  NA 137 02/13/2019  K 4.8 02/13/2019  CL 106 02/13/2019  CO2 21.6 (L) 02/13/2019   Lab Results  Component Value Date  HGBA1C 7.9 (H) 02/13/2019   Lab Results  Component Value Date  HDL 28.9 (L) 02/13/2019  HDL 32.3 (L) 08/03/2018  HDL 29.4 (L) 01/19/2018   Lab Results  Component Value Date  LDLCALC 97 02/13/2019  LDLCALC 97 08/03/2018  LDLCALC 91 01/19/2018   Lab Results  Component Value Date  TRIG 220 (H) 02/13/2019  TRIG 165 08/03/2018  TRIG 151 01/19/2018   Lab Results  Component Value Date  ALT 21 02/13/2019  AST 16 02/13/2019  ALKPHOS 101 02/13/2019   Lab Results  Component Value Date  TSH 3.944 08/31/2018   Assessment and Plan   68 y.o. female with  ICD-10-CM ICD-9-CM  1. Increased heart rate-appears to have sinus rhythm at an increased rate. Does not have as many palpitations recently however. She is gradually attempting increase her activity however is limited by shortness of breath when ambulating. Heart rate is fairly well controlled at present. Will continue with current medical regimen. No evidence of heavy metal toxicity or proteinuria. R00.0 785.0  2. Coronary artery disease involving native coronary artery of native heart without angina pectoris-currently stable despite multiple stents. Cardiac catheterization at [facility] as well as at [facility] showed no critical disease with patent stents. Will continue with dual anti-platelet therapy, daily exercise as tolerated and low-fat cardiac diet. Her symptoms have progressed and now with they are similar to what she had prior to her PCI. She underwent stress  cardiac MR which revealed no evidence of any significant abnormalities. I25.10 414.01  3. Mixed hyperlipidemia-continue with atorvastatin and Zetia. Liver function is normal. LDL is currently 97. LDL goal of less than 100. Continue with low-fat diet. E78.2 272.2  4. Essential hypertension with goal blood pressure less than 130/80-continue with current regimen for blood pressure including dash diet I10 401.9  5. PVD (peripheral vascular disease) I73.9 443.9  6. CKD (chronic kidney disease), stage III-will follow-up. Creatinine is currently 1.7. N18.3 585.3   7. Continue to hydrate daily. Her gastroparesis appears to be resolving.  8. Shortness of breath-allergy testing unrevealing. Has left diaphragmatic paralysis. Consideration for diaphragmatic plication was raised by pulmonary. Patient desires second opinion regarding this procedure. We will make referrals as desired. Consideration of right heart cath was discussed by her pulmonologist. We will proceed with this. Risk and benefit of right heart cath were explained. Right heart cath for pulmonary hypertension.  Return in about 4 weeks (around 05/21/2019).  These notes generated with voice recognition software. I apologize for typographical errors.  Sydnee Levans, MD  Pt seen and examined. No change from above.

## 2019-06-07 ENCOUNTER — Encounter: Payer: Self-pay | Admitting: *Deleted

## 2019-06-07 ENCOUNTER — Emergency Department
Admission: EM | Admit: 2019-06-07 | Discharge: 2019-06-07 | Disposition: A | Discharge status: Home / Self Care | Payer: Medicare Other | Attending: Emergency Medicine | Admitting: Emergency Medicine

## 2019-06-07 ENCOUNTER — Other Ambulatory Visit: Payer: Self-pay

## 2019-06-07 DIAGNOSIS — E1140 Type 2 diabetes mellitus with diabetic neuropathy, unspecified: Secondary | ICD-10-CM | POA: Diagnosis not present

## 2019-06-07 DIAGNOSIS — E1122 Type 2 diabetes mellitus with diabetic chronic kidney disease: Secondary | ICD-10-CM | POA: Diagnosis not present

## 2019-06-07 DIAGNOSIS — N183 Chronic kidney disease, stage 3 unspecified: Secondary | ICD-10-CM | POA: Insufficient documentation

## 2019-06-07 DIAGNOSIS — T380X5A Adverse effect of glucocorticoids and synthetic analogues, initial encounter: Secondary | ICD-10-CM | POA: Diagnosis not present

## 2019-06-07 DIAGNOSIS — Z955 Presence of coronary angioplasty implant and graft: Secondary | ICD-10-CM | POA: Insufficient documentation

## 2019-06-07 DIAGNOSIS — I2510 Atherosclerotic heart disease of native coronary artery without angina pectoris: Secondary | ICD-10-CM | POA: Diagnosis not present

## 2019-06-07 DIAGNOSIS — E1165 Type 2 diabetes mellitus with hyperglycemia: Secondary | ICD-10-CM | POA: Insufficient documentation

## 2019-06-07 DIAGNOSIS — Z794 Long term (current) use of insulin: Secondary | ICD-10-CM | POA: Insufficient documentation

## 2019-06-07 DIAGNOSIS — Z85831 Personal history of malignant neoplasm of soft tissue: Secondary | ICD-10-CM | POA: Insufficient documentation

## 2019-06-07 DIAGNOSIS — Z7982 Long term (current) use of aspirin: Secondary | ICD-10-CM | POA: Insufficient documentation

## 2019-06-07 DIAGNOSIS — Y69 Unspecified misadventure during surgical and medical care: Secondary | ICD-10-CM | POA: Insufficient documentation

## 2019-06-07 DIAGNOSIS — T887XXA Unspecified adverse effect of drug or medicament, initial encounter: Secondary | ICD-10-CM | POA: Diagnosis not present

## 2019-06-07 DIAGNOSIS — Z79899 Other long term (current) drug therapy: Secondary | ICD-10-CM | POA: Diagnosis not present

## 2019-06-07 DIAGNOSIS — I129 Hypertensive chronic kidney disease with stage 1 through stage 4 chronic kidney disease, or unspecified chronic kidney disease: Secondary | ICD-10-CM | POA: Insufficient documentation

## 2019-06-07 DIAGNOSIS — R739 Hyperglycemia, unspecified: Secondary | ICD-10-CM | POA: Diagnosis present

## 2019-06-07 LAB — BASIC METABOLIC PANEL
Anion gap: 12 (ref 5–15)
BUN: 35 mg/dL — ABNORMAL HIGH (ref 8–23)
CO2: 19 mmol/L — ABNORMAL LOW (ref 22–32)
Calcium: 8.8 mg/dL — ABNORMAL LOW (ref 8.9–10.3)
Chloride: 102 mmol/L (ref 98–111)
Creatinine, Ser: 1.85 mg/dL — ABNORMAL HIGH (ref 0.44–1.00)
GFR calc Af Amer: 32 mL/min — ABNORMAL LOW (ref >60)
GFR calc non Af Amer: 27 mL/min — ABNORMAL LOW (ref >60)
Glucose, Bld: 474 mg/dL — ABNORMAL HIGH (ref 70–99)
Potassium: 4.5 mmol/L (ref 3.5–5.1)
Sodium: 133 mmol/L — ABNORMAL LOW (ref 135–145)

## 2019-06-07 LAB — URINALYSIS, COMPLETE (UACMP) WITH MICROSCOPIC
Bilirubin Urine: NEGATIVE
Glucose, UA: >=500 mg/dL — AB
Hgb urine dipstick: NEGATIVE
Ketones, ur: NEGATIVE mg/dL
Leukocytes,Ua: NEGATIVE
Nitrite: NEGATIVE
Protein, ur: NEGATIVE mg/dL
Specific Gravity, Urine: 1.024 (ref 1.005–1.030)
pH: 5 (ref 5.0–8.0)

## 2019-06-07 LAB — GLUCOSE, CAPILLARY
Glucose-Capillary: 327 mg/dL — ABNORMAL HIGH (ref 70–99)
Glucose-Capillary: 293 mg/dL — ABNORMAL HIGH (ref 70–99)
Glucose-Capillary: 274 mg/dL — ABNORMAL HIGH (ref 70–99)
Glucose-Capillary: 222 mg/dL — ABNORMAL HIGH (ref 70–99)

## 2019-06-07 LAB — CBC
HCT: 33.6 % — ABNORMAL LOW (ref 36.0–46.0)
Hemoglobin: 11.1 g/dL — ABNORMAL LOW (ref 12.0–15.0)
MCH: 30.8 pg (ref 26.0–34.0)
MCHC: 33 g/dL (ref 30.0–36.0)
MCV: 93.3 fL (ref 80.0–100.0)
Platelets: 253 10*3/uL (ref 150–400)
RBC: 3.6 MIL/uL — ABNORMAL LOW (ref 3.87–5.11)
RDW: 12.3 % (ref 11.5–15.5)
WBC: 9.6 10*3/uL (ref 4.0–10.5)
nRBC: 0 % (ref 0.0–0.2)

## 2019-06-07 LAB — TROPONIN I (HIGH SENSITIVITY): Troponin I (High Sensitivity): <2 ng/L (ref <18)

## 2019-06-07 MED ORDER — SODIUM CHLORIDE 0.9 % IV BOLUS
1000.0000 mL | Freq: Once | INTRAVENOUS | Status: AC
  Administered 2019-06-07: 1000 mL via INTRAVENOUS

## 2019-06-07 MED ORDER — INSULIN ASPART 100 UNIT/ML ~~LOC~~ SOLN
10.0000 [IU] | Freq: Once | SUBCUTANEOUS | Status: AC
  Administered 2019-06-07: 10 [IU] via INTRAVENOUS
  Filled 2019-06-07: qty 1

## 2019-06-07 NOTE — ED Notes (Signed)
pts husband came to stat desk asking for glucose recheck.  Pt was having no symptoms, but wanted it checked since shes been here for awhile.  CBG 327

## 2019-06-07 NOTE — ED Provider Notes (Addendum)
[facility] Emergency Department Provider Note  ____________________________________________  Time seen: Approximately 5:20 AM  I have reviewed the triage vital signs and the nursing notes.   HISTORY  Chief Complaint Hyperglycemia   HPI Dorothy Rogers is a 69 y.o. female with a history of insulin-dependent type 2 diabetes, chronic kidney disease, CAD, anemia, hypertension, hyperlipidemia who presents for evaluation of hyperglycemia.  Patient saw her primary care doctor 2 days ago for neck pain.  She was given an IM shot of steroids and sent home on prednisone.  Yesterday morning when she woke up her sugar was low in the 50s.  She took 2 sugar tabs and went to sleep.  She woke up a few hours later and her sugar was in the low 100s.  She then took 40 mg of prednisone and when she rechecked her sugar it was in the 500s.  Patient took 20 units of NovoLog before coming to the emergency room.  No nausea, vomiting, abdominal pain.   Past Medical History:  Diagnosis Date  . Anemia   . Anemia   . Arthritis    lower back, hips  . Blood transfusion without reported diagnosis   . CAD (coronary artery disease) FEB AND NOV 2009   6 STENTS  . Cancer ([place])    tumor back of neck-fibroushistocytoma  . Cataract   . Chronic kidney disease    Stage III  . Coronary artery disease   . DDD (degenerative disc disease), lumbar   . Diabetes mellitus without complication ([place])    TYPE 2  . Herpes simplex   . Hyperlipidemia   . Hypertension    CONTROLLED ON MEDS  . Vertigo    occasional, no episodes in 2-3 months  . Vertigo    OCCASIONALLY  . Vitamin B 12 deficiency     Patient Active Problem List   Diagnosis Date Noted  . Esophageal dysphagia   . Chronic left hip pain 05/17/2018  . Elevated erythrocyte sedimentation rate 05/17/2018  . OSA (obstructive sleep apnea) 12/07/2017  . Bilateral sciatica 11/29/2016  . Gastroparesis due to DM ([place]) 11/26/2016  .  Cholelithiasis   . Protein-calorie malnutrition, severe 05/18/2016  . Supraventricular tachycardia ([place]) 05/05/2016  . History of paroxysmal supraventricular tachycardia 05/05/2016  . SOB (shortness of breath)   . Near syncope 04/07/2016  . 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 Non-intractable cyclical vomiting with nausea   . Congenital esophageal defect   . Hiatal hernia   . Gastritis   . Diarrhea 07/10/2015  . Diabetes mellitus ([place]) 07/02/2015  . Adjustment disorder 06/16/2015  . Abdominal pain 06/16/2015  . C. difficile colitis 06/16/2015  . Intractable nausea and vomiting 06/09/2015  . Dehydration 06/09/2015  . Type 2 diabetes mellitus with diabetic neuropathy ([place]) 06/09/2015  . Hyperlipemia 06/09/2015  . Hypertension 06/09/2015  . CAD (coronary artery disease) 06/09/2015  . CKD (chronic kidney disease), stage III 06/09/2015  . Thrombocytopenia ([place]) 05/30/2015  . Lumbar canal stenosis 05/30/2015  . Disc disease with myelopathy, lumbar 05/30/2015  . Presence of stent in coronary artery 05/30/2015  . 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 Status post lumbar spinal fusion 12/25/2014  . DDD (degenerative disc disease), lumbar 11/18/2014  . Lumbar post-laminectomy syndrome 11/18/2014  . Facet syndrome, lumbar 11/18/2014  . Piriformis syndrome of left side 11/18/2014  . Status post lumbar spine operation 05/09/2014  . Adiposity 04/23/2014  . Thoracic and lumbosacral neuritis 03/27/2013  . H/O arthrodesis 03/27/2013  . Peripheral  vascular disease ([place]) 03/22/2013  . Hypomagnesemia 03/22/2013  . Absolute anemia 03/22/2013    Past Surgical History:  Procedure Laterality Date  . [place] STUDY N/A 02/04/2016   Procedure: [place] STUDY;  Surgeon: Lucilla Lame, MD;  Location: [facility] ENDOSCOPY;  Service: Endoscopy;  Laterality: N/A;  . ABDOMINAL HYSTERECTOMY    . BACK SURGERY     Lumbar spinal fusion 05/30/2015  . CARDIAC CATHETERIZATION  2009  . CATARACT EXTRACTION  Left   . CATARACT EXTRACTION W/PHACO Right 03/19/2015   Procedure: CATARACT EXTRACTION PHACO AND INTRAOCULAR LENS PLACEMENT ([place]);  Surgeon: Leandrew Koyanagi, MD;  Location: [place];  Service: Ophthalmology;  Laterality: Right;  DIABETIC - insulin and oral meds  . CHOLECYSTECTOMY N/A 05/19/2016   Procedure: LAPAROSCOPIC CHOLECYSTECTOMY;  Surgeon: Clayburn Pert, MD;  Location: [facility] ORS;  Service: General;  Laterality: N/A;  . CORONARY STENT PLACEMENT  03/2008  . CYSTECTOMY Right    wrist  . ESOPHAGEAL MANOMETRY N/A 02/04/2016   Procedure: ESOPHAGEAL MANOMETRY (EM);  Surgeon: Lucilla Lame, MD;  Location: [facility] ENDOSCOPY;  Service: Endoscopy;  Laterality: N/A;  . ESOPHAGOGASTRODUODENOSCOPY (EGD) WITH PROPOFOL N/A 09/04/2015   Procedure: ESOPHAGOGASTRODUODENOSCOPY (EGD) WITH PROPOFOL;  Surgeon: Lucilla Lame, MD;  Location: [place];  Service: Endoscopy;  Laterality: N/A;  INSULIN DEPENDENT DIABETIC  . ESOPHAGOGASTRODUODENOSCOPY (EGD) WITH PROPOFOL N/A 06/29/2016   Procedure: ESOPHAGOGASTRODUODENOSCOPY (EGD) WITH PROPOFOL;  Surgeon: Jonathon Bellows, MD;  Location: [facility] ENDOSCOPY;  Service: Endoscopy;  Laterality: N/A;  . ESOPHAGOGASTRODUODENOSCOPY (EGD) WITH PROPOFOL N/A 01/25/2019   Procedure: ESOPHAGOGASTRODUODENOSCOPY (EGD) WITH PROPOFOL;  Surgeon: Jonathon Bellows, MD;  Location: [facility] ENDOSCOPY;  Service: Gastroenterology;  Laterality: N/A;  . EYE SURGERY Left    cataract extraction  . HIATAL HERNIA REPAIR  02/2016   Dr Adonis Huguenin  . [place] CATH AND CORONARY ANGIOGRAPHY Left 08/18/2017   Procedure: LEFT HEART CATH AND CORONARY ANGIOGRAPHY;  Surgeon: Teodoro Spray, MD;  Location: [place] [facility];  Service: Cardiovascular;  Laterality: Left;  . LUMBAR FUSION  2001, 2014, 2015  . RIGHT HEART CATH N/A 05/10/2019   Procedure: RIGHT HEART CATH;  Surgeon: Teodoro Spray, MD;  Location: [place] [facility];  Service: Cardiovascular;  Laterality: N/A;  . SPINE SURGERY     2001,  2014, 2015  . TUMOR EXCISION     back of neck    Prior to Admission medications   Medication Sig Start Date End Date Taking? Authorizing Provider  acetaminophen (TYLENOL) 500 MG tablet Take 1,000 mg by mouth every 6 (six) hours as needed for mild pain or moderate pain.     [provider]  acyclovir (ZOVIRAX) 400 MG tablet Take 400 mg by mouth daily as needed (for fever blisters).  08/07/15   [provider]  aspirin EC 81 MG EC tablet Take 1 tablet (81 mg total) by mouth daily. Patient taking differently: Take 81 mg by mouth at bedtime.  05/21/16   Nicholes Mango, MD  atorvastatin (LIPITOR) 40 MG tablet Take 40 mg by mouth at bedtime.  08/16/14   [provider]  Biotin 5000 MCG TABS Take 5,000 mcg by mouth daily.    [provider]  Calcium Carb-Cholecalciferol (CALTRATE 600+D3 PO) Take 1 tablet by mouth daily.    [provider]  CARTIA XT 120 MG 24 hr capsule Take 120 mg by mouth daily. 12/13/17   [provider]  Cholecalciferol (VITAMIN D-3) 125 MCG (5000 UT) TABS Take 5,000 Units by  mouth daily.    [provider]  clopidogrel (PLAVIX) 75 MG tablet Take 75 mg by mouth daily.    [provider]  conjugated estrogens (PREMARIN) vaginal cream Place 0.5 g vaginally 2 (two) times a week.    [provider]  Cyanocobalamin (VITAMIN B-12) 5000 MCG SUBL Place 5,000 mcg under the tongue daily.    [provider]  DULoxetine (CYMBALTA) 30 MG capsule Take 30 mg by mouth daily. 04/03/19   [provider]  ferrous sulfate 325 (65 FE) MG tablet Take 325 mg by mouth daily with breakfast.    [provider]  glipiZIDE (GLUCOTROL) 10 MG tablet Take 10 mg by mouth 2 (two) times daily. Before breakfast & before supper    [provider]  HUMALOG KWIKPEN 100 UNIT/ML KiwkPen Inject 0-10 Units into the skin 3 (three) times daily. Sliding Scale Insulin 03/02/17   [provider]  ibuprofen  (ADVIL) 200 MG tablet Take 400 mg by mouth every 8 (eight) hours as needed (for pain.).    [provider]  LANTUS SOLOSTAR 100 UNIT/ML Solostar Pen Inject 36 Units into the skin daily at 12 noon. (1300) 05/01/19   [provider]  LORazepam (ATIVAN) 1 MG tablet Take 1 mg by mouth every 8 (eight) hours as needed for anxiety.    [provider]  losartan (COZAAR) 100 MG tablet Take 100 mg by mouth daily.  02/05/14   [provider]  ondansetron (ZOFRAN ODT) 8 MG disintegrating tablet Take 8 mg by mouth as needed for nausea or vomiting.    [provider]  Probiotic CAPS Take 1 capsule by mouth daily.    [provider]    Allergies Tramadol, Beta adrenergic blockers, Carbamazepine, Celebrex [celecoxib], Gabapentin, Niacin and related, Oxycodone, Shellfish allergy, Vicodin [hydrocodone-acetaminophen], and Heparin  Family History  Problem Relation Age of Onset  . Heart disease Father   . Diabetes Other   . Breast cancer Neg Hx     Social History Social History   Tobacco Use  . Smoking status: Never Smoker  . Smokeless tobacco: Never Used  Substance Use Topics  . Alcohol use: Yes    Alcohol/week: 0.0 standard drinks    Comment: 2-3 drinks per year  . Drug use: Never    Review of Systems  Constitutional: Negative for fever. Eyes: Negative for visual changes. ENT: Negative for sore throat. Neck: No neck pain  Cardiovascular: Negative for chest pain. Respiratory: Negative for shortness of breath. Gastrointestinal: Negative for abdominal pain, vomiting or diarrhea. Genitourinary: Negative for dysuria. Musculoskeletal: Negative for back pain. Skin: Negative for rash. Neurological: Negative for headaches, weakness or numbness. Psych: No SI or HI  ____________________________________________   PHYSICAL EXAM:  VITAL SIGNS: ED Triage Vitals  Enc Vitals Group     BP 06/07/19 0100 (!) 186/81     Pulse Rate 06/07/19 0100 (!)  128     Resp 06/07/19 0100 20     Temp 06/07/19 0100 98.7 F (37.1 C)     Temp Source 06/07/19 0100 Oral     SpO2 06/07/19 0100 99 %     Weight 06/07/19 0101 182 lb (82.6 kg)     Height 06/07/19 0101 5\' 2"  (1.575 m)     Head Circumference --      Peak Flow --      Pain Score 06/07/19 0101 0     Pain Loc --      Pain Edu? --  Excl. in [place]? --     Constitutional: Alert and oriented. Well appearing and in no apparent distress. HEENT:      Head: Normocephalic and atraumatic.         Eyes: Conjunctivae are normal. Sclera is non-icteric.       Mouth/Throat: Mucous membranes are moist.       Neck: Supple with no signs of meningismus. Cardiovascular: Tachycardic rate with regular rhythm. No murmurs, gallops, or rubs. 2+ symmetrical distal pulses are present in all extremities. No JVD. Respiratory: Normal respiratory effort. Lungs are clear to auscultation bilaterally. No wheezes, crackles, or rhonchi.  Gastrointestinal: Soft, non tender, and non distended with positive bowel sounds. No rebound or guarding. Musculoskeletal: Nontender with normal range of motion in all extremities. No edema, cyanosis, or erythema of extremities. Neurologic: Normal speech and language. Face is symmetric. Moving all extremities. No gross focal neurologic deficits are appreciated. Skin: Skin is warm, dry and intact. No rash noted. Psychiatric: Mood and affect are normal. Speech and behavior are normal.  ____________________________________________   LABS (all labs ordered are listed, but only abnormal results are displayed)  Labs Reviewed  CBC - Abnormal; Notable for the following components:      Result Value   RBC 3.60 (*)    Hemoglobin 11.1 (*)    HCT 33.6 (*)    All other components within normal limits  URINALYSIS, COMPLETE (UACMP) WITH MICROSCOPIC - Abnormal; Notable for the following components:   Color, Urine YELLOW (*)    APPearance HAZY (*)    Glucose, UA >=500 (*)    Bacteria, UA RARE  (*)    All other components within normal limits  BASIC METABOLIC PANEL - Abnormal; Notable for the following components:   Sodium 133 (*)    CO2 19 (*)    Glucose, Bld 474 (*)    BUN 35 (*)    Creatinine, Ser 1.85 (*)    Calcium 8.8 (*)    GFR calc non Af Amer 27 (*)    GFR calc Af Amer 32 (*)    All other components within normal limits  GLUCOSE, CAPILLARY - Abnormal; Notable for the following components:   Glucose-Capillary 327 (*)    All other components within normal limits  GLUCOSE, CAPILLARY - Abnormal; Notable for the following components:   Glucose-Capillary 293 (*)    All other components within normal limits  GLUCOSE, CAPILLARY - Abnormal; Notable for the following components:   Glucose-Capillary 274 (*)    All other components within normal limits  CBG MONITORING, ED  CBG MONITORING, ED  CBG MONITORING, ED  CBG MONITORING, ED  TROPONIN I (HIGH SENSITIVITY)   ____________________________________________  EKG  ED ECG REPORT I, Rudene Re, the attending physician, personally viewed and interpreted this ECG.  Sinus tachycardia, rate of 116, normal intervals, normal axis, no ST elevations or depressions. ____________________________________________  RADIOLOGY  none  ____________________________________________   PROCEDURES  Procedure(s) performed: None Procedures Critical Care performed:  Yes  CRITICAL CARE Performed by: Rudene Re  ?  Total critical care time: 30 min  Critical care time was exclusive of separately billable procedures and treating other patients.  Critical care was necessary to treat or prevent imminent or life-threatening deterioration.  Critical care was time spent personally by me on the following activities: development of treatment plan with patient and/or surrogate as well as nursing, discussions with consultants, evaluation of patient's response to treatment, examination of patient, obtaining history from patient  or surrogate, ordering  and performing treatments and interventions, ordering and review of laboratory studies, ordering and review of radiographic studies, pulse oximetry and re-evaluation of patient's condition.  ____________________________________________   INITIAL IMPRESSION / ASSESSMENT AND PLAN / ED COURSE   69 y.o. female with a history of insulin-dependent type 2 diabetes, chronic kidney disease, CAD, anemia, hypertension, hyperlipidemia who presents for evaluation of hyperglycemia in the setting of current steroid use.  Patient is otherwise asymptomatic.  Labs showing hyperglycemia no evidence of DKA.  BG already trending down in the ED before patient brought into a room. Will hydrate patient and reassess.  Clinical Course as of Jun 06 725  Thu Jun 07, 2019  0656 BG continues to trend down and it is 274 after fluids. Patient is uncomfortable with BG and reports that she is usually in the low 200s at home. Will give a dose of insulin and recheck in 20 min for discharge.  Discussed stopping prednisone but patient reports that they are really helping with her neck pain and she wishes to continue to take them as prescribed by her doctor. Therefore I gave her a more intense sliding scale to be followed until patient is done with her steroids.  Discussed my standard return precautions and follow-up with PCP.   [CV]    Clinical Course User Index [CV] Alfred Levins [place], MD      As part of my medical decision making, I reviewed the following data within the [place] notes reviewed and incorporated, Labs reviewed , EKG interpreted , Old EKG reviewed, Old chart reviewed, Notes from prior ED visits and [place] Controlled Substance Database   Please note:  Patient was evaluated in Emergency Department today for the symptoms described in the history of present illness. Patient was evaluated in the context of the global COVID-19 pandemic, which necessitated consideration that  the patient might be at risk for infection with the SARS-CoV-2 virus that causes COVID-19. Institutional protocols and algorithms that pertain to the evaluation of patients at risk for COVID-19 are in a state of rapid change based on information released by regulatory bodies including the CDC and federal and state organizations. These policies and algorithms were followed during the patient's care in the ED.  Some ED evaluations and interventions may be delayed as a result of limited staffing during the pandemic.   ____________________________________________   FINAL CLINICAL IMPRESSION(S) / ED DIAGNOSES   Final diagnoses:  Steroid-induced hyperglycemia      NEW MEDICATIONS STARTED DURING THIS VISIT:  ED Discharge Orders    None       Note:  This document was prepared using Dragon voice recognition software and may include unintentional dictation errors.    Alfred Levins, [place], MD 06/07/19 [place], [place], MD 06/16/19 [id number]

## 2019-06-07 NOTE — ED Notes (Signed)
Pt ambulatory to bathroom

## 2019-06-07 NOTE — Discharge Instructions (Addendum)
Follow the below sliding scale for humalog until you have been off of prednisone for at least 24 hours. Do not change your Lantus.  CBG < 70: No insulin CBG 70 - 120: 0 units CBG 121 - 150: 3 units CBG 151 - 200: 4 units CBG 201 - 250: 7 units CBG 251 - 300: 11 units CBG 301 - 350: 15 units CBG 351 - 400: 20 units CBG > 400: call your doctor

## 2019-06-07 NOTE — ED Triage Notes (Signed)
Pt ambulatory to triage.  Pt reports elevated blood sugar.  Pt on prednisone for neck pain since yesterday.  No chest pain no sob.  Pt alert  Speech clear

## 2019-06-20 ENCOUNTER — Other Ambulatory Visit: Payer: Self-pay | Admitting: Sports Medicine

## 2019-06-20 DIAGNOSIS — M25512 Pain in left shoulder: Secondary | ICD-10-CM

## 2019-06-20 DIAGNOSIS — G8929 Other chronic pain: Secondary | ICD-10-CM

## 2019-06-20 DIAGNOSIS — M7552 Bursitis of left shoulder: Secondary | ICD-10-CM

## 2019-06-20 DIAGNOSIS — M7542 Impingement syndrome of left shoulder: Secondary | ICD-10-CM

## 2019-06-20 DIAGNOSIS — M75112 Incomplete rotator cuff tear or rupture of left shoulder, not specified as traumatic: Secondary | ICD-10-CM

## 2019-06-26 ENCOUNTER — Ambulatory Visit: Payer: Medicare Other | Admitting: Gastroenterology

## 2019-06-30 ENCOUNTER — Other Ambulatory Visit: Payer: Self-pay

## 2019-06-30 ENCOUNTER — Ambulatory Visit
Admission: RE | Admit: 2019-06-30 | Discharge: 2019-06-30 | Disposition: A | Discharge status: Home / Self Care | Payer: Medicare Other | Source: Ambulatory Visit | Attending: Sports Medicine | Admitting: Sports Medicine

## 2019-06-30 DIAGNOSIS — M7542 Impingement syndrome of left shoulder: Secondary | ICD-10-CM | POA: Diagnosis present

## 2019-06-30 DIAGNOSIS — M7552 Bursitis of left shoulder: Secondary | ICD-10-CM | POA: Insufficient documentation

## 2019-06-30 DIAGNOSIS — M25512 Pain in left shoulder: Secondary | ICD-10-CM | POA: Diagnosis present

## 2019-06-30 DIAGNOSIS — M75112 Incomplete rotator cuff tear or rupture of left shoulder, not specified as traumatic: Secondary | ICD-10-CM | POA: Diagnosis present

## 2019-06-30 DIAGNOSIS — G8929 Other chronic pain: Secondary | ICD-10-CM | POA: Diagnosis present

## 2019-06-30 IMAGING — MR MR SHOULDER*L* W/O CM
4 of 5 series · 31 of 40 positions shown · non-contrast
Comparison: MRI [DATE]

CLINICAL DATA: Anterior left shoulder pain for 1 month with
decreased range of motion

EXAM:
MRI OF THE LEFT SHOULDER WITHOUT CONTRAST
TECHNIQUE: Multiplanar, multisequence MR imaging of the shoulder was performed.
No intravenous contrast was administered.

[Series 5: PD fat-sat · axial · left · 4.0mm · 0.55mm/px · z∈[-91,+39]mm · 8 of 28 slices shown (1 of 2)]
[im 1/28]
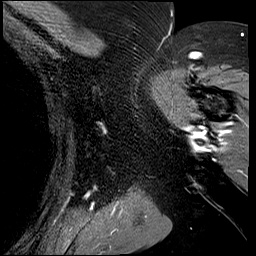
[im 4/28]
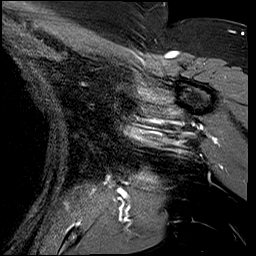
[im 10/28]
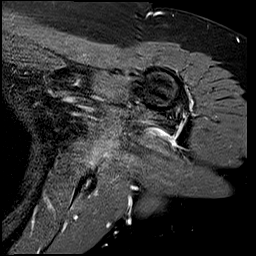
[im 13/28]
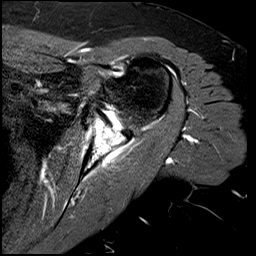
[im 16/28]
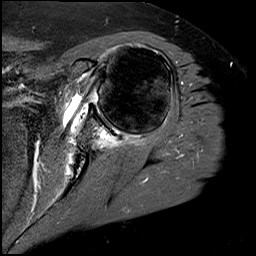
[im 19/28]
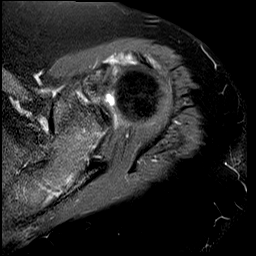
[im 25/28]
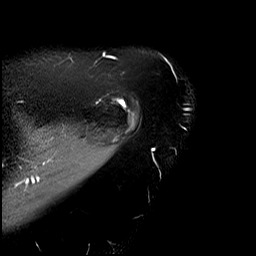
[im 28/28]
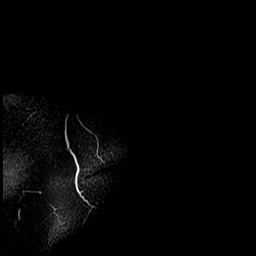

[Series 6: PD fat-sat · oblique · left · 4.0mm · 0.44mm/px · 8 of 26 slices shown (2 of 2)]
[im 1/26]
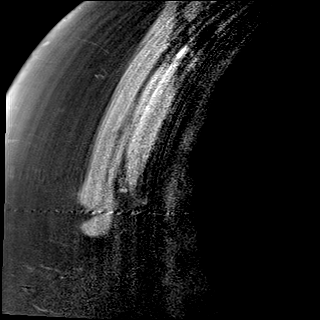
[im 4/26]
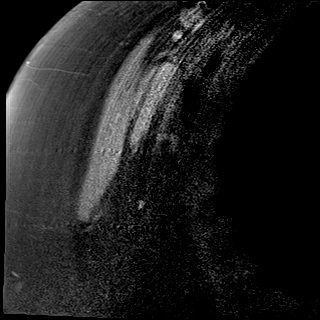
[im 8/26]
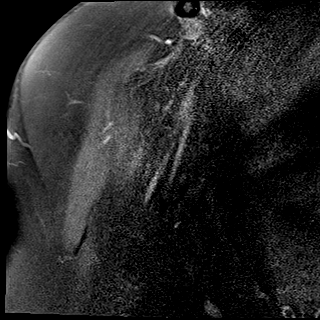
[im 11/26]
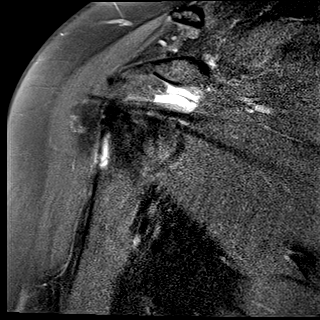
[im 15/26]
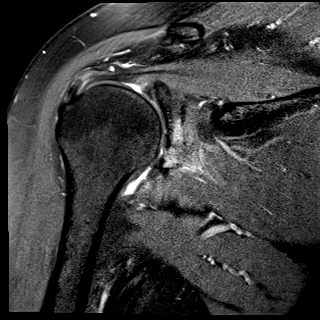
[im 18/26]
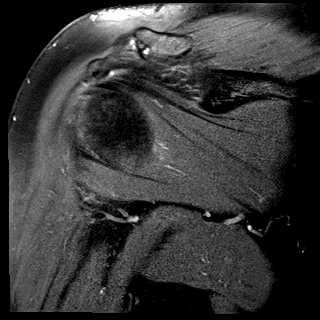
[im 22/26]
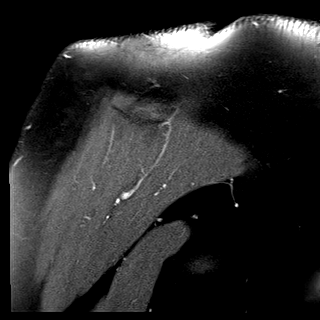
[im 26/26]
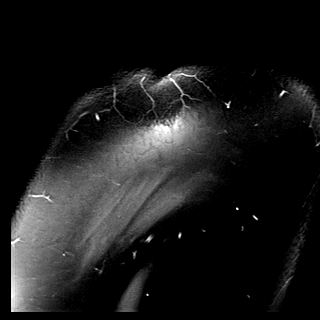

[Series 7: T2 fat-sat · oblique · left · 4.0mm · 0.44mm/px · 8 of 26 slices shown (1 of 2)]
[im 1/26]
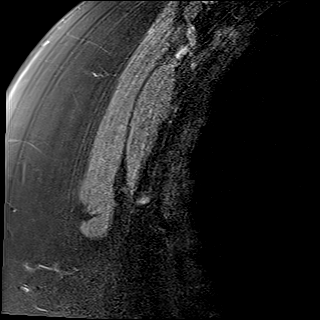
[im 4/26]
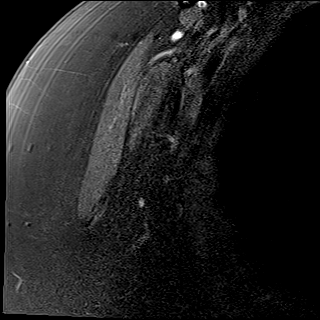
[im 8/26]
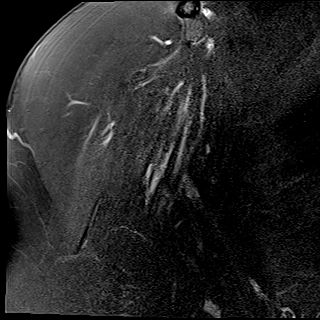
[im 11/26]
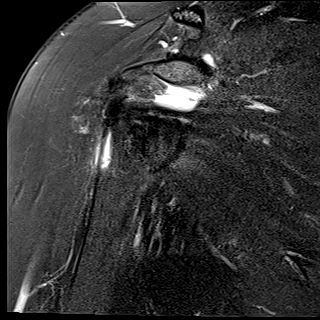
[im 15/26]
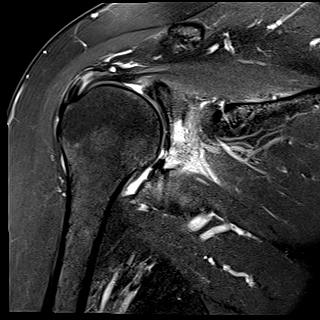
[im 18/26]
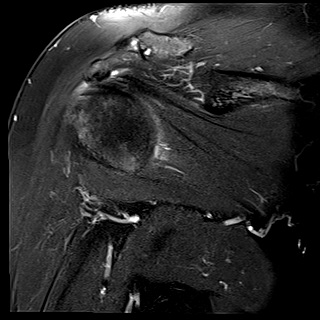
[im 22/26]
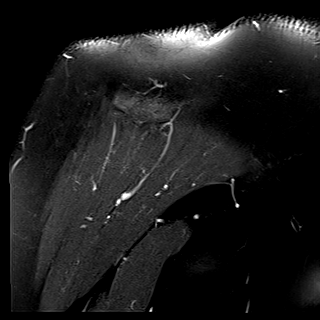
[im 26/26]
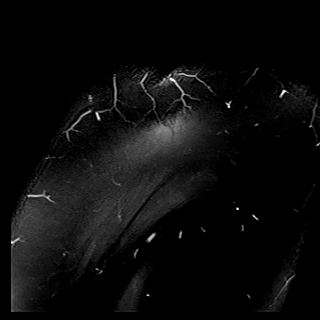

[Series 8: T2 fat-sat · oblique · left · 4.0mm · 0.23mm/px · 7 of 22 slices shown (2 of 2)]
[im 1/22]
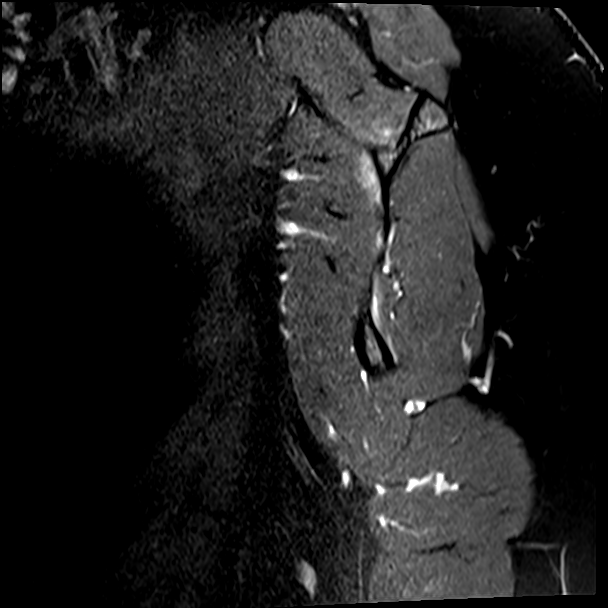
[im 4/22]
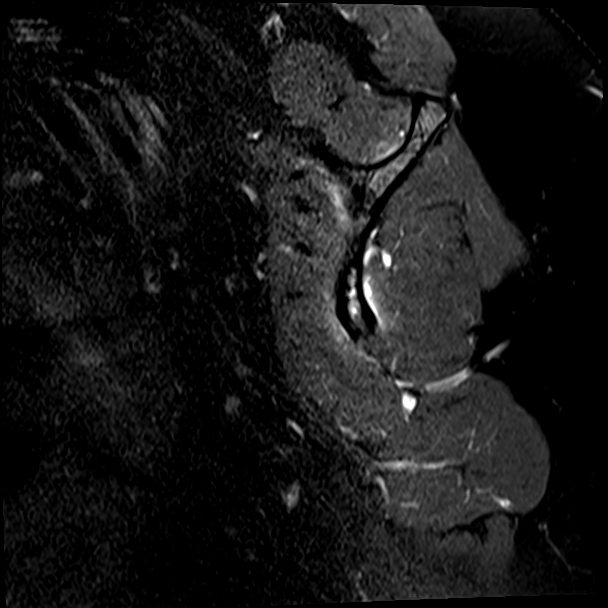
[im 8/22]
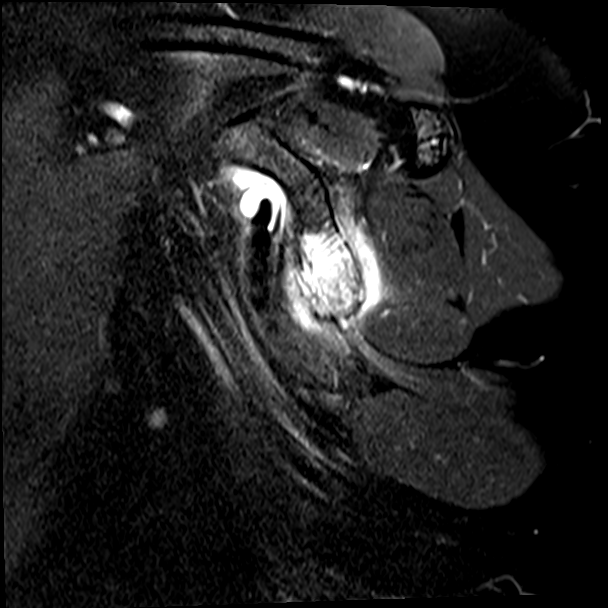
[im 11/22]
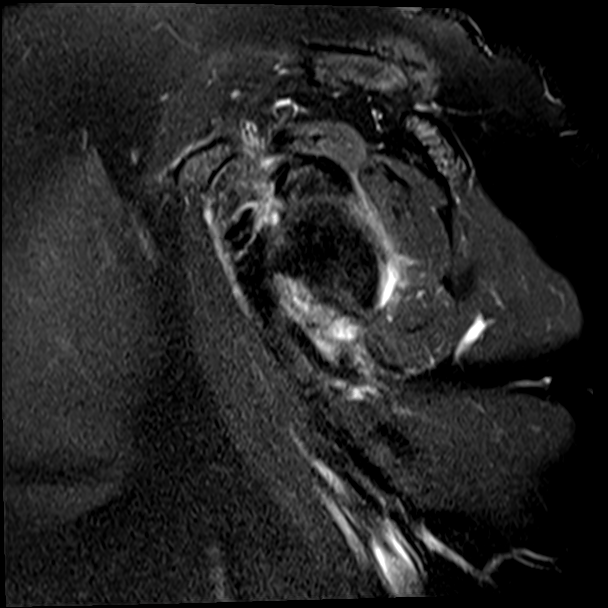
[im 15/22]
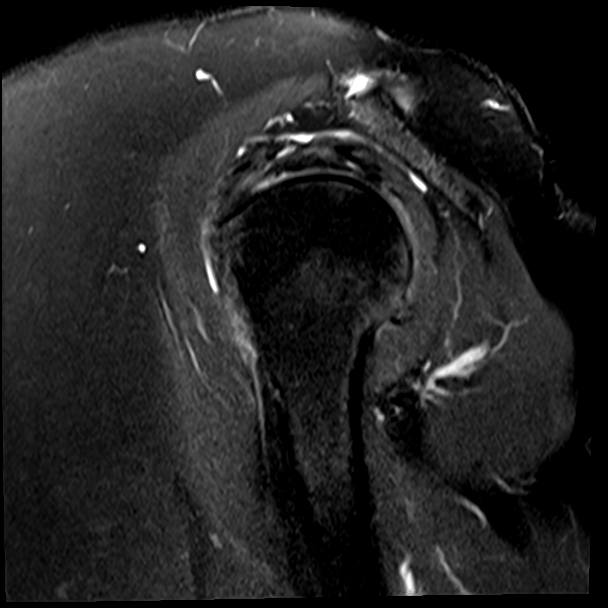
[im 18/22]
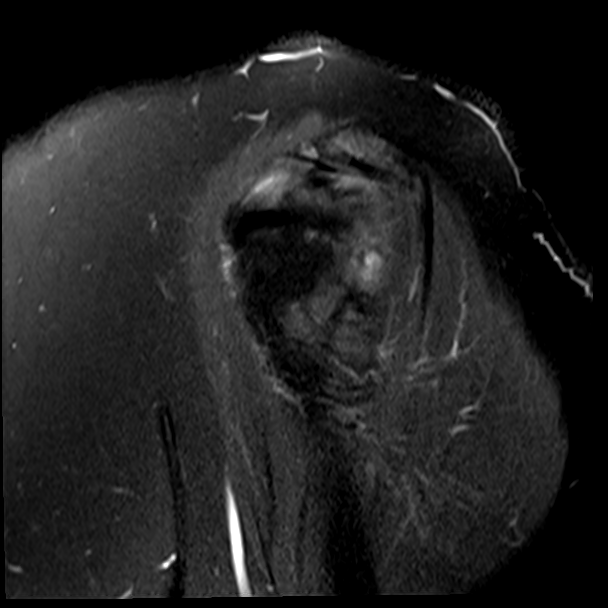
[im 22/22]
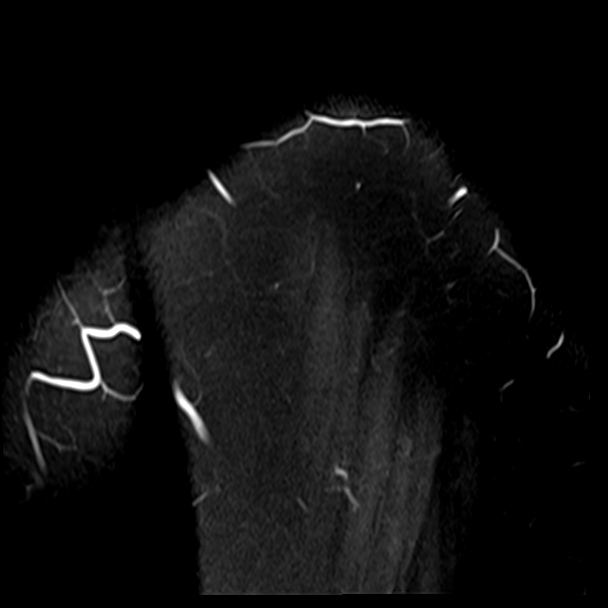

[31 of 40 positions shown; findings below may reference images not displayed]

FINDINGS: Technical note: Examination is mildly degraded by motion artifact.

Rotator cuff: Moderate supraspinatus tendinosis with
partial-thickness bursal sided tearing of the mid aspect of the
distal tendon involving up to 50% of the tendon depth (series 7,
images 13-14). Mild infraspinatus tendinosis. Subscapularis and
teres minor tendons appear intact.

Muscles: No atrophy or abnormal signal of the muscles of the rotator
cuff.

Biceps long head: Grossly intact. Intra-articular portion of the
biceps tendon is not well evaluated.

Acromioclavicular Joint: Minimal arthropathy of the AC joint. Trace
subacromial-subdeltoid bursal fluid.

Glenohumeral Joint: Large full-thickness cartilage defect of the
central and inferior aspects of the glenoid (series 5, images 13-16;
series 7, image 14). There is chondral thinning and surface
irregularity of the humeral head. Small joint effusion.

Labrum:  Poorly evaluated but appears diffusely degenerated.

Bones: Marked bone marrow edema throughout the central and inferior
aspects of the bony glenoid with irregular linear low T1/T2 signal
within the subchondral bone suggesting fracture (series 8, image 9;
series 5, image 15). There is associated periosteal edema. Humeral
head is intact. No dislocation.

Other: None.
IMPRESSION: 1. Marked bone marrow edema throughout the central and inferior
aspects of the bony glenoid with irregular linear low T1/T2 signal
within the subchondral bone suggesting fracture.
2. Multiple full-thickness chondral defects of the central and
inferior aspects of the glenoid.
3. Moderate supraspinatus tendinosis with partial-thickness bursal
sided tearing involving up to 50% of the tendon depth. No
full-thickness rotator cuff tear.
4. Mild infraspinatus tendinosis.

These results will be called to the ordering clinician or
representative by the Radiologist Assistant, and communication
documented in the PACS or zVision Dashboard.

## 2019-07-06 ENCOUNTER — Other Ambulatory Visit: Payer: Self-pay | Admitting: Sports Medicine

## 2019-07-06 ENCOUNTER — Ambulatory Visit
Admission: RE | Admit: 2019-07-06 | Discharge: 2019-07-06 | Disposition: A | Discharge status: Home / Self Care | Payer: Medicare Other | Source: Ambulatory Visit | Attending: Sports Medicine | Admitting: Sports Medicine

## 2019-07-06 ENCOUNTER — Other Ambulatory Visit: Payer: Self-pay

## 2019-07-06 DIAGNOSIS — G8929 Other chronic pain: Secondary | ICD-10-CM

## 2019-07-06 DIAGNOSIS — R937 Abnormal findings on diagnostic imaging of other parts of musculoskeletal system: Secondary | ICD-10-CM | POA: Diagnosis present

## 2019-07-06 DIAGNOSIS — M25512 Pain in left shoulder: Secondary | ICD-10-CM | POA: Insufficient documentation

## 2019-07-06 DIAGNOSIS — M19012 Primary osteoarthritis, left shoulder: Secondary | ICD-10-CM

## 2019-07-25 ENCOUNTER — Other Ambulatory Visit: Payer: Self-pay | Admitting: Pulmonary Disease

## 2019-07-25 DIAGNOSIS — R0609 Other forms of dyspnea: Secondary | ICD-10-CM

## 2019-07-27 ENCOUNTER — Other Ambulatory Visit: Payer: Self-pay | Admitting: Pulmonary Disease

## 2019-07-27 ENCOUNTER — Other Ambulatory Visit
Admission: RE | Admit: 2019-07-27 | Discharge: 2019-07-27 | Disposition: A | Discharge status: Home / Self Care | Payer: Medicare Other | Source: Ambulatory Visit | Attending: Pulmonary Disease | Admitting: Pulmonary Disease

## 2019-07-27 ENCOUNTER — Other Ambulatory Visit: Payer: Self-pay | Admitting: Physical Medicine and Rehabilitation

## 2019-07-27 DIAGNOSIS — R0600 Dyspnea, unspecified: Secondary | ICD-10-CM

## 2019-07-27 DIAGNOSIS — M5416 Radiculopathy, lumbar region: Secondary | ICD-10-CM

## 2019-07-27 LAB — FIBRIN DERIVATIVES D-DIMER (ARMC ONLY): Fibrin derivatives D-dimer (ARMC): 603.43 ng/mL (FEU) — ABNORMAL HIGH (ref 0.00–499.00)

## 2019-07-31 ENCOUNTER — Ambulatory Visit: Payer: Medicare Other

## 2019-07-31 DIAGNOSIS — J45909 Unspecified asthma, uncomplicated: Secondary | ICD-10-CM | POA: Insufficient documentation

## 2019-08-01 ENCOUNTER — Encounter
Admission: RE | Admit: 2019-08-01 | Discharge: 2019-08-01 | Disposition: A | Discharge status: Home / Self Care | Payer: Medicare Other | Source: Ambulatory Visit | Attending: Pulmonary Disease | Admitting: Pulmonary Disease

## 2019-08-01 ENCOUNTER — Other Ambulatory Visit: Payer: Self-pay

## 2019-08-01 ENCOUNTER — Ambulatory Visit
Admission: RE | Admit: 2019-08-01 | Discharge: 2019-08-01 | Disposition: A | Discharge status: Home / Self Care | Payer: Medicare Other | Source: Ambulatory Visit | Attending: Pulmonary Disease | Admitting: Pulmonary Disease

## 2019-08-01 DIAGNOSIS — R0600 Dyspnea, unspecified: Secondary | ICD-10-CM | POA: Diagnosis present

## 2019-08-01 MED ORDER — TECHNETIUM TO 99M ALBUMIN AGGREGATED
4.4600 | Freq: Once | INTRAVENOUS | Status: AC | PRN
  Administered 2019-08-01: 4.46 via INTRAVENOUS

## 2019-08-07 ENCOUNTER — Other Ambulatory Visit: Payer: Self-pay | Admitting: Cardiology

## 2019-08-07 DIAGNOSIS — R1012 Left upper quadrant pain: Secondary | ICD-10-CM

## 2019-08-08 ENCOUNTER — Ambulatory Visit: Payer: Medicare Other

## 2019-08-08 ENCOUNTER — Other Ambulatory Visit: Payer: Self-pay

## 2019-08-08 ENCOUNTER — Ambulatory Visit
Admission: RE | Admit: 2019-08-08 | Discharge: 2019-08-08 | Disposition: A | Discharge status: Home / Self Care | Payer: Medicare Other | Source: Ambulatory Visit | Attending: Physical Medicine and Rehabilitation | Admitting: Physical Medicine and Rehabilitation

## 2019-08-08 DIAGNOSIS — M5416 Radiculopathy, lumbar region: Secondary | ICD-10-CM | POA: Insufficient documentation

## 2019-08-13 ENCOUNTER — Ambulatory Visit
Admission: RE | Admit: 2019-08-13 | Discharge: 2019-08-13 | Disposition: A | Discharge status: Home / Self Care | Payer: Medicare Other | Source: Ambulatory Visit | Attending: Cardiology | Admitting: Cardiology

## 2019-08-13 ENCOUNTER — Ambulatory Visit: Payer: Medicare Other

## 2019-08-13 ENCOUNTER — Other Ambulatory Visit: Payer: Self-pay

## 2019-08-13 DIAGNOSIS — R1012 Left upper quadrant pain: Secondary | ICD-10-CM

## 2019-08-30 ENCOUNTER — Ambulatory Visit: Payer: Medicare Other | Admitting: Gastroenterology

## 2019-10-09 ENCOUNTER — Other Ambulatory Visit: Payer: Self-pay | Admitting: Neurology

## 2019-10-09 DIAGNOSIS — R0602 Shortness of breath: Secondary | ICD-10-CM

## 2019-10-19 ENCOUNTER — Ambulatory Visit: Payer: Medicare Other

## 2019-10-20 ENCOUNTER — Ambulatory Visit
Admission: RE | Admit: 2019-10-20 | Discharge: 2019-10-20 | Disposition: A | Discharge status: Home / Self Care | Payer: Medicare Other | Source: Ambulatory Visit | Attending: Neurology | Admitting: Neurology

## 2019-10-20 DIAGNOSIS — R0602 Shortness of breath: Secondary | ICD-10-CM | POA: Insufficient documentation

## 2019-10-29 ENCOUNTER — Other Ambulatory Visit: Payer: Self-pay

## 2019-10-30 ENCOUNTER — Other Ambulatory Visit: Payer: Self-pay

## 2019-10-30 ENCOUNTER — Encounter: Payer: Self-pay | Admitting: Infectious Diseases

## 2019-10-30 ENCOUNTER — Ambulatory Visit: Payer: Medicare Other | Attending: Infectious Diseases | Admitting: Infectious Diseases

## 2019-10-30 VITALS — BP 154/83 | HR 95 | Temp 98.1°F | Resp 16 | Ht 62.0 in | Wt 182.0 lb

## 2019-10-30 DIAGNOSIS — E1122 Type 2 diabetes mellitus with diabetic chronic kidney disease: Secondary | ICD-10-CM | POA: Diagnosis not present

## 2019-10-30 DIAGNOSIS — Z794 Long term (current) use of insulin: Secondary | ICD-10-CM | POA: Insufficient documentation

## 2019-10-30 DIAGNOSIS — Z7982 Long term (current) use of aspirin: Secondary | ICD-10-CM | POA: Insufficient documentation

## 2019-10-30 DIAGNOSIS — M5136 Other intervertebral disc degeneration, lumbar region: Secondary | ICD-10-CM | POA: Diagnosis not present

## 2019-10-30 DIAGNOSIS — Z8249 Family history of ischemic heart disease and other diseases of the circulatory system: Secondary | ICD-10-CM | POA: Diagnosis not present

## 2019-10-30 DIAGNOSIS — Z791 Long term (current) use of non-steroidal anti-inflammatories (NSAID): Secondary | ICD-10-CM | POA: Insufficient documentation

## 2019-10-30 DIAGNOSIS — I129 Hypertensive chronic kidney disease with stage 1 through stage 4 chronic kidney disease, or unspecified chronic kidney disease: Secondary | ICD-10-CM | POA: Insufficient documentation

## 2019-10-30 DIAGNOSIS — D649 Anemia, unspecified: Secondary | ICD-10-CM | POA: Diagnosis not present

## 2019-10-30 DIAGNOSIS — I2510 Atherosclerotic heart disease of native coronary artery without angina pectoris: Secondary | ICD-10-CM | POA: Insufficient documentation

## 2019-10-30 DIAGNOSIS — E785 Hyperlipidemia, unspecified: Secondary | ICD-10-CM | POA: Insufficient documentation

## 2019-10-30 DIAGNOSIS — Z885 Allergy status to narcotic agent status: Secondary | ICD-10-CM | POA: Diagnosis not present

## 2019-10-30 DIAGNOSIS — Z85831 Personal history of malignant neoplasm of soft tissue: Secondary | ICD-10-CM | POA: Insufficient documentation

## 2019-10-30 DIAGNOSIS — F419 Anxiety disorder, unspecified: Secondary | ICD-10-CM | POA: Diagnosis not present

## 2019-10-30 DIAGNOSIS — R894 Abnormal immunological findings in specimens from other organs, systems and tissues: Secondary | ICD-10-CM

## 2019-10-30 DIAGNOSIS — Z888 Allergy status to other drugs, medicaments and biological substances status: Secondary | ICD-10-CM | POA: Diagnosis not present

## 2019-10-30 DIAGNOSIS — M797 Fibromyalgia: Secondary | ICD-10-CM

## 2019-10-30 DIAGNOSIS — Z79899 Other long term (current) drug therapy: Secondary | ICD-10-CM | POA: Insufficient documentation

## 2019-10-30 DIAGNOSIS — N183 Chronic kidney disease, stage 3 unspecified: Secondary | ICD-10-CM | POA: Insufficient documentation

## 2019-10-30 DIAGNOSIS — Z955 Presence of coronary angioplasty implant and graft: Secondary | ICD-10-CM | POA: Insufficient documentation

## 2019-10-30 DIAGNOSIS — Z981 Arthrodesis status: Secondary | ICD-10-CM | POA: Diagnosis not present

## 2019-10-30 DIAGNOSIS — Z833 Family history of diabetes mellitus: Secondary | ICD-10-CM | POA: Insufficient documentation

## 2019-10-30 DIAGNOSIS — B2700 Gammaherpesviral mononucleosis without complication: Secondary | ICD-10-CM | POA: Diagnosis not present

## 2019-10-30 DIAGNOSIS — R760 Raised antibody titer: Secondary | ICD-10-CM

## 2019-10-30 NOTE — Patient Instructions (Addendum)
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 You do not have an active infection because of that now and you dont need to have any kind of treatment- neither it is the cause of your symptoms, You have a high ESR, anemia - and weakness and fatigue in your arms-- you will have to see rheumatology. When you do your next blood work ,get a repeat ESR, HIV and hepatitis C (the latter two test are just routine test)

## 2019-10-30 NOTE — Progress Notes (Signed)
NAME: Dorothy Rogers  DOB: 1951-02-22  MRN: [id number]  Date/Time: 10/30/2019 12:04 PM   Subjective:   Patient referred to me for positive Epstein-Barr virus antibodies.  ? Dorothy Rogers is a 68 y.o. 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  She had undergone CAT scan chest which was normal.  She was worked up for left diaphragmatic paralysis secondary to hiatus hernia surgery because of a positive sniff test.  She was referred to a surgeon but was not considered as surgical candidate because of very mild symptoms and lack of diaphragm being pulled up on imaging. She is followed by Dr. Lanney Gins who has been investigating her for shortness of breath with a variety of lab tests.  The recent ESR was 106 from May 2021. He was concerned about fibromyalgia and sent Lyme antibodies and Epstein-Barr virus antibodies.  EBV AB VCA, IgM is less than 36 normal EBV AB VCA IgG is 324 high EBV nuclear antigen antibody IgG is 398.  So she has been referred to me. Patient does not have any fever or chills or weight loss She has chronic fatigue She has achiness and feeling of tiredness in her shoulders especially when she tries to comb her hair or put mascara.  She also has a feeling of shortness of breath when she is doing this and when walking. She has hip pain as well She is not immunocompromised She does not have any malignancy currently She is not on any chemotherapy She has not had any transplant She has chronic anemia and is on iron.  Past Medical History:   Diagnosis Date  . Anemia   . Anemia   . Arthritis    lower back, hips  . Blood transfusion without reported diagnosis   . CAD (coronary artery disease) FEB AND NOV 2009   6 STENTS  . Cancer ([place])    tumor back of neck-fibroushistocytoma  . Cataract   . Chronic kidney disease    Stage III  . Coronary artery disease   . DDD (degenerative disc disease), lumbar   . Diabetes mellitus without complication ([place])    TYPE 2  . Herpes simplex   . Hyperlipidemia   . Hypertension    CONTROLLED ON MEDS  . Vertigo    occasional, no episodes in 2-3 months  . Vertigo    OCCASIONALLY  . Vitamin B 12 deficiency     Past Surgical History:  Procedure Laterality Date  . [place] STUDY N/A 02/04/2016   Procedure: [place] STUDY;  Surgeon: Lucilla Lame, MD;  Location: [facility] ENDOSCOPY;  Service: Endoscopy;  Laterality: N/A;  . ABDOMINAL HYSTERECTOMY    . BACK SURGERY     Lumbar spinal fusion 05/30/2015  . CARDIAC CATHETERIZATION  2009  . CATARACT EXTRACTION Left   . CATARACT EXTRACTION W/PHACO Right 03/19/2015   Procedure: CATARACT EXTRACTION PHACO AND INTRAOCULAR LENS PLACEMENT ([place]);  Surgeon: Leandrew Koyanagi, MD;  Location: [facility];  Service: Ophthalmology;  Laterality: Right;  DIABETIC - insulin and oral meds  . CHOLECYSTECTOMY N/A 05/19/2016   Procedure: LAPAROSCOPIC CHOLECYSTECTOMY;  Surgeon: Clayburn Pert, MD;  Location: [facility] ORS;  Service: General;  Laterality: N/A;  . CORONARY STENT PLACEMENT  03/2008  . CYSTECTOMY Right    wrist  . ESOPHAGEAL MANOMETRY N/A 02/04/2016   Procedure: ESOPHAGEAL MANOMETRY (EM);  Surgeon: Lucilla Lame, MD;  Location: [facility] ENDOSCOPY;  Service: Endoscopy;  Laterality: N/A;  . ESOPHAGOGASTRODUODENOSCOPY (EGD) WITH PROPOFOL N/A 09/04/2015   Procedure: ESOPHAGOGASTRODUODENOSCOPY (EGD) WITH PROPOFOL;  Surgeon: Lucilla Lame, MD;  Location: [place];  Service: Endoscopy;  Laterality: N/A;  INSULIN DEPENDENT DIABETIC  .  ESOPHAGOGASTRODUODENOSCOPY (EGD) WITH PROPOFOL N/A 06/29/2016   Procedure: ESOPHAGOGASTRODUODENOSCOPY (EGD) WITH PROPOFOL;  Surgeon: Jonathon Bellows, MD;  Location: [facility] ENDOSCOPY;  Service: Endoscopy;  Laterality: N/A;  . ESOPHAGOGASTRODUODENOSCOPY (EGD) WITH PROPOFOL N/A 01/25/2019   Procedure: ESOPHAGOGASTRODUODENOSCOPY (EGD) WITH PROPOFOL;  Surgeon: Jonathon Bellows, MD;  [facility] END[facility];  Service: Gastroenterology;  Laterality: N/A;  . EYE SURGERY Left    cataract extraction  . HIATAL HERNIA REPAIR  02/2016   Dr Adonis Huguenin  . [place] CATH AND CORONARY ANGIOGRAPHY Left 08/18/2017   Procedure: LEFT HEART CATH AND CORONARY ANGIOGRAPHY;  Surgeon: Teodoro Spray, MD;  Location: [place] [facility];  Service: Cardiovascular;  Laterality: Left;  . LUMBAR FUSION  2001, 2014, 2015  . RIGHT HEART CATH N/A 05/10/2019   Procedure: RIGHT HEART CATH;  Surgeon: Teodoro Spray, MD;  Location: [place] [facility];  Service: Cardiovascular;  Laterality: N/A;  . SPINE SURGERY     2001, 2014, 2015  . TUMOR EXCISION     back of neck    Social History   Socioeconomic History  . Marital status: Married    Spouse name: Not on file  . Number of children: Not on file  . Years of education: Not on file  . Highest education level: Not on file  Occupational History  . Not on file  Tobacco Use  . Smoking status: Never Smoker  . Smokeless tobacco: Never Used  Substance and Sexual Activity  . Alcohol use: Yes    Alcohol/week: 0.0 standard drinks    Comment: 2-3 drinks per year  . Drug use: Never  . Sexual activity: Not Currently  Other Topics Concern  . Not on file  Social History Narrative  . Not on file   Social Determinants of Health   Financial Resource Strain:   . Difficulty of Paying Living Expenses:   Food Insecurity:   . Worried About Charity fundraiser in the Last Year:   . Arboriculturist in the Last Year:   Transportation Needs:   . Film/video editor (Medical):   Marland Kitchen Lack of  Transportation (Non-Medical):   Physical Activity:   . Days of Exercise per Week:   . Minutes of Exercise per Session:   Stress:   . Feeling of Stress :   Social Connections:   . Frequency of Communication with Friends and Family:   . Frequency of Social Gatherings with Friends and Family:   . Attends Religious Services:   . Active Member of Clubs or Organizations:   . Attends Archivist Meetings:   Marland Kitchen Marital Status:   Intimate Partner Violence:   . Fear of Current or Ex-Partner:   . Emotionally Abused:   Marland Kitchen Physically Abused:   . Sexually Abused:     Family History  Problem Relation Age of Onset  . Heart disease Father   . Diabetes Other   . Breast cancer Neg Hx  Allergies  Allergen Reactions  . Sildenafil Anaphylaxis  . Tramadol Swelling  . Beta Adrenergic Blockers Hives  . Carbamazepine Nausea Only  . Celebrex [Celecoxib] Other (See Comments)    A lot of pressure in head  . Gabapentin Other (See Comments)    Dizziness, arms weak, high dose only  . Niacin And Related Other (See Comments)    Flushing and hot sensation  . Oxycodone Nausea Only  . Shellfish Allergy Nausea And Vomiting  . Vicodin [Hydrocodone-Acetaminophen] Other (See Comments)    Hypoglycemic episodes  . Heparin Rash   ID  Recent  Procedure Surgery Injections Trauma Sick contacts Travel Antibiotic use Food- raw/exotic Steroid/immune suppressants/splenectomy/Hardware Animal bites Tick exposure Water sports Fishing/hunting/animal bird exposure ? Current Outpatient Medications  Medication Sig Dispense Refill  . acetaminophen (TYLENOL) 500 MG tablet Take 1,000 mg by mouth every 6 (six) hours as needed for mild pain or moderate pain.     Marland Kitchen acyclovir (ZOVIRAX) 400 MG tablet Take 400 mg by mouth daily as needed (for fever blisters).     Marland Kitchen aspirin EC 81 MG EC tablet Take 1 tablet (81 mg total) by mouth daily. (Patient taking differently: Take 81 mg by mouth at bedtime. )    .  atorvastatin (LIPITOR) 40 MG tablet Take 1 tablet by mouth once daily    . Biotin 10 MG TABS Take by mouth.    . Calcium Carb-Cholecalciferol (CALTRATE 600+D3 PO) Take 1 tablet by mouth daily.    . Calcium Carbonate-Vitamin D 600-400 MG-UNIT tablet Take by mouth.    Geronimo Boot XT 120 MG 24 hr capsule Take 120 mg by mouth daily.  3  . Cholecalciferol (VITAMIN D-3) 125 MCG (5000 UT) TABS Take 5,000 Units by mouth daily.    . clopidogrel (PLAVIX) 75 MG tablet Take 1 tablet by mouth once daily    . conjugated estrogens (PREMARIN) vaginal cream Place 0.5 g vaginally 2 (two) times a week.    . Cyanocobalamin (VITAMIN B-12) 5000 MCG SUBL Place 5,000 mcg under the tongue daily.    . diazepam (VALIUM) 2 MG tablet Take by mouth.    . ferrous sulfate 325 (65 FE) MG tablet Take 325 mg by mouth daily with breakfast.    . glipiZIDE (GLUCOTROL) 10 MG tablet Take 10 mg by mouth 2 (two) times daily. Before breakfast & before supper    . ibuprofen (ADVIL) 200 MG tablet Take 400 mg by mouth every 8 (eight) hours as needed (for pain.).    Marland Kitchen insulin glargine (LANTUS SOLOSTAR) 100 UNIT/ML Solostar Pen INJECT 36 TO 50 UNITS SUBCUTANEOUSLY ONCE DAILY AS DIRECTED    . insulin lispro (HUMALOG) 100 UNIT/ML injection Inject into the skin 3 (three) times daily before meals. Sliding scale    . loperamide (IMODIUM) 2 MG capsule Take by mouth.    . losartan (COZAAR) 100 MG tablet Take 1 tablet by mouth once daily    . melatonin 1 MG TABS tablet Take by mouth.    . ondansetron (ZOFRAN ODT) 8 MG disintegrating tablet Take 8 mg by mouth as needed for nausea or vomiting.    . Probiotic CAPS Take 1 capsule by mouth daily.    . modafinil (PROVIGIL) 100 MG tablet Take by mouth.     No current facility-administered medications for this visit.   Facility-Administered Medications Ordered in Other Visits  Medication Dose Route Frequency Provider Last Rate Last Admin  . sodium chloride flush (NS) 0.9 % injection 3 mL  3  mL Intravenous  Q12H Teodoro Spray, MD         Abtx:  Anti-infectives (From admission, onward)   None      REVIEW OF SYSTEMS:  Const: negative fever, negative chills, negative weight loss Eyes: negative diplopia or visual changes, negative eye pain ENT: negative coryza, negative sore throat Resp: negative cough, hemoptysis, has dyspnea Cards: negative for chest pain, palpitations, lower extremity edema GU: negative for frequency, dysuria and hematuria GI: Negative for abdominal pain, diarrhea, bleeding, constipation Skin: negative for rash and pruritus Heme: negative for easy bruising and gum/nose bleeding MS:  myalgias, arthralgias, back pain and muscle weakness Neurolo: Has headaches, no dizziness, no vertigo, memory problems  Psych: Anxiety Endocrine: Diabetes mellitus Allergy/Immunology-multiple side effects/allergies to medications as listed above Objective:  VITALS:  BP (!) 154/83   Pulse 95   Temp 98.1 F (36.7 C)   Resp 16   Ht 5' 2"  (1.575 m)   Wt 182 lb (82.6 kg)   SpO2 100%   BMI 33.29 kg/m    Pulse ox on at rest is 98% on walking briskly it is 97% PHYSICAL EXAM:  General: Alert, cooperative, no distress, appears stated age.  Head: Normocephalic, without obvious abnormality, atraumatic. Eyes: Conjunctivae clear, anicteric sclerae. Pupils are equal ENT Nares normal. No drainage or sinus tenderness. Lips, mucosa, and tongue normal. No Thrush Neck: Scar on the nape of the neck , symmetrical, no adenopathy, thyroid: non tender no carotid bruit and no JVD. Back: No CVA tenderness. Lungs: Clear to auscultation bilaterally. No Wheezing or Rhonchi. No rales. Heart: Tachycardia regular rate and rhythm, no murmur, rub or gallop. Abdomen: Soft, non-tender,not distended. Bowel sounds normal. No masses Extremities: atraumatic, no cyanosis. No edema. No clubbing Skin: No rashes or lesions. Or bruising Lymph: Cervical, supraclavicular normal. Neurologic: Grossly  non-focal Pertinent Labs Lab Results CBC    Component Value Date/Time   WBC 9.6 06/07/2019 0109   RBC 3.60 (L) 06/07/2019 0109   HGB 11.1 (L) 06/07/2019 0109   HGB 10.3 (L) 05/14/2014 1842   HCT 33.6 (L) 06/07/2019 0109   HCT 30.4 (L) 05/14/2014 1842   PLT 253 06/07/2019 0109   PLT 170 05/14/2014 1842   MCV 93.3 06/07/2019 0109   MCV 92 05/14/2014 1842   MCH 30.8 06/07/2019 0109   MCHC 33.0 06/07/2019 0109   RDW 12.3 06/07/2019 0109   RDW 13.4 05/14/2014 1842   LYMPHSABS 1.7 06/03/2018 1752   LYMPHSABS 0.7 (L) 05/14/2014 1842   MONOABS 0.5 06/03/2018 1752   MONOABS 0.5 05/14/2014 1842   EOSABS 0.1 06/03/2018 1752   EOSABS 0.1 05/14/2014 1842   BASOSABS 0.0 06/03/2018 1752   BASOSABS 0.0 05/14/2014 1842    CMP Latest Ref Rng & Units 06/07/2019 06/03/2018 08/25/2017  Glucose 70 - 99 mg/dL 474(H) 243(H) 312(H)  BUN 8 - 23 mg/dL 35(H) 35(H) 39(H)  Creatinine 0.44 - 1.00 mg/dL 1.85(H) 1.67(H) 2.14(H)  Sodium 135 - 145 mmol/L 133(L) 136 135  Potassium 3.5 - 5.1 mmol/L 4.5 5.1 5.1  Chloride 98 - 111 mmol/L 102 105 106  CO2 22 - 32 mmol/L 19(L) 22 20(L)  Calcium 8.9 - 10.3 mg/dL 8.8(L) 9.2 8.4(L)  Total Protein 6.5 - 8.1 g/dL - - 7.1  Total Bilirubin 0.3 - 1.2 mg/dL - - 0.7  Alkaline Phos 38 - 126 U/L - - 99  AST 15 - 41 U/L - - 19  ALT 14 - 54 U/L - - 19     IMAGING  RESULTS: Reviewed CT scan chest I have personally reviewed the films ? Impression/Recommendation ?Positive Epstein-Barr virus IgG antibody and negative IgM.  This indicates a prior infection.  There is no indication of reactivation or ongoing infection. There is no significance to the increased IgG antibodies. Patient is not immunocompromised or on immunosuppressive therapy and the elevated IgG antibodies has no relevance. Neither it is the cause of her  fibromyalgia or shortness of breath ?No need for any treatment or further diagnostic intervention.  Shortness of breath more subjective with no impairment of  oxygenation. Has been extensively investigated  Increased ESR, hemoglobin of 10.9, fatigue of the shoulders may indicate polymyalgia rheumatica.  Would recommend repeating the ESR to confirm whether it is high and refer to rheumatologist if needed Recommend getting a baseline HIV and hepatitis C antibodies  History of fibrous histiocytoma  a malignant soft tissue tumor on the right side of the neck and it was excised in 1986.  No obvious recurrence .  CAD status post stents on Lipitor, diltiazem  Tachycardia  Multiple vertebral surgeries, laminectomies and vertebral fusion  Discussed the management with the patient and her husband in great detail.   Encouraged her to do gradual exercise, water aerobics, get adequate sleep and incorporate relaxation techniques.  Patient discharged from my clinic and will see her if needed    note:  This document was prepared using Dragon voice recognition software and may include unintentional dictation errors.

## 2020-02-06 ENCOUNTER — Other Ambulatory Visit: Payer: Self-pay | Admitting: Internal Medicine

## 2020-02-06 DIAGNOSIS — R0609 Other forms of dyspnea: Secondary | ICD-10-CM

## 2020-02-06 DIAGNOSIS — R1012 Left upper quadrant pain: Secondary | ICD-10-CM

## 2020-02-06 DIAGNOSIS — Z1231 Encounter for screening mammogram for malignant neoplasm of breast: Secondary | ICD-10-CM

## 2020-02-19 ENCOUNTER — Ambulatory Visit: Payer: Medicare Other

## 2020-02-22 ENCOUNTER — Ambulatory Visit
Admission: RE | Admit: 2020-02-22 | Discharge: 2020-02-22 | Disposition: A | Discharge status: Home / Self Care | Payer: Medicare Other | Source: Ambulatory Visit | Attending: Internal Medicine | Admitting: Internal Medicine

## 2020-02-22 ENCOUNTER — Other Ambulatory Visit: Payer: Self-pay

## 2020-02-22 DIAGNOSIS — R1012 Left upper quadrant pain: Secondary | ICD-10-CM | POA: Insufficient documentation

## 2020-02-22 DIAGNOSIS — R0600 Dyspnea, unspecified: Secondary | ICD-10-CM | POA: Insufficient documentation

## 2020-02-22 DIAGNOSIS — R0609 Other forms of dyspnea: Secondary | ICD-10-CM

## 2020-02-26 ENCOUNTER — Ambulatory Visit: Payer: Medicare Other

## 2020-03-03 ENCOUNTER — Ambulatory Visit
Admission: RE | Admit: 2020-03-03 | Discharge: 2020-03-03 | Disposition: A | Discharge status: Home / Self Care | Payer: Medicare Other | Source: Ambulatory Visit | Attending: Internal Medicine | Admitting: Internal Medicine

## 2020-03-03 ENCOUNTER — Other Ambulatory Visit: Payer: Self-pay

## 2020-03-03 DIAGNOSIS — Z1231 Encounter for screening mammogram for malignant neoplasm of breast: Secondary | ICD-10-CM | POA: Insufficient documentation

## 2020-05-12 ENCOUNTER — Other Ambulatory Visit: Payer: Self-pay

## 2020-05-12 ENCOUNTER — Emergency Department
Admission: EM | Admit: 2020-05-12 | Discharge: 2020-05-13 | Disposition: A | Discharge status: Home / Self Care | Payer: Medicare Other | Attending: Emergency Medicine | Admitting: Emergency Medicine

## 2020-05-12 ENCOUNTER — Ambulatory Visit
Admission: EM | Admit: 2020-05-12 | Discharge: 2020-05-12 | Disposition: A | Discharge status: Home / Self Care | Payer: Medicare Other | Attending: Family Medicine | Admitting: Family Medicine

## 2020-05-12 ENCOUNTER — Emergency Department: Payer: Medicare Other

## 2020-05-12 ENCOUNTER — Encounter: Payer: Self-pay | Admitting: Emergency Medicine

## 2020-05-12 DIAGNOSIS — E1140 Type 2 diabetes mellitus with diabetic neuropathy, unspecified: Secondary | ICD-10-CM | POA: Diagnosis not present

## 2020-05-12 DIAGNOSIS — Z20822 Contact with and (suspected) exposure to covid-19: Secondary | ICD-10-CM | POA: Diagnosis not present

## 2020-05-12 DIAGNOSIS — Z79899 Other long term (current) drug therapy: Secondary | ICD-10-CM | POA: Insufficient documentation

## 2020-05-12 DIAGNOSIS — R112 Nausea with vomiting, unspecified: Secondary | ICD-10-CM | POA: Diagnosis not present

## 2020-05-12 DIAGNOSIS — Z955 Presence of coronary angioplasty implant and graft: Secondary | ICD-10-CM | POA: Insufficient documentation

## 2020-05-12 DIAGNOSIS — Z7982 Long term (current) use of aspirin: Secondary | ICD-10-CM | POA: Insufficient documentation

## 2020-05-12 DIAGNOSIS — R42 Dizziness and giddiness: Secondary | ICD-10-CM | POA: Diagnosis present

## 2020-05-12 DIAGNOSIS — I2510 Atherosclerotic heart disease of native coronary artery without angina pectoris: Secondary | ICD-10-CM | POA: Insufficient documentation

## 2020-05-12 DIAGNOSIS — R519 Headache, unspecified: Secondary | ICD-10-CM

## 2020-05-12 DIAGNOSIS — J45909 Unspecified asthma, uncomplicated: Secondary | ICD-10-CM | POA: Diagnosis not present

## 2020-05-12 DIAGNOSIS — E1122 Type 2 diabetes mellitus with diabetic chronic kidney disease: Secondary | ICD-10-CM | POA: Insufficient documentation

## 2020-05-12 DIAGNOSIS — Z7902 Long term (current) use of antithrombotics/antiplatelets: Secondary | ICD-10-CM | POA: Insufficient documentation

## 2020-05-12 DIAGNOSIS — I129 Hypertensive chronic kidney disease with stage 1 through stage 4 chronic kidney disease, or unspecified chronic kidney disease: Secondary | ICD-10-CM | POA: Insufficient documentation

## 2020-05-12 DIAGNOSIS — Z794 Long term (current) use of insulin: Secondary | ICD-10-CM | POA: Insufficient documentation

## 2020-05-12 DIAGNOSIS — N183 Chronic kidney disease, stage 3 unspecified: Secondary | ICD-10-CM | POA: Insufficient documentation

## 2020-05-12 LAB — BASIC METABOLIC PANEL
Anion gap: 11 (ref 5–15)
BUN: 28 mg/dL — ABNORMAL HIGH (ref 8–23)
CO2: 21 mmol/L — ABNORMAL LOW (ref 22–32)
Calcium: 9.1 mg/dL (ref 8.9–10.3)
Chloride: 106 mmol/L (ref 98–111)
Creatinine, Ser: 1.58 mg/dL — ABNORMAL HIGH (ref 0.44–1.00)
GFR, Estimated: 35 mL/min — ABNORMAL LOW (ref >60)
Glucose, Bld: 182 mg/dL — ABNORMAL HIGH (ref 70–99)
Potassium: 4.4 mmol/L (ref 3.5–5.1)
Sodium: 138 mmol/L (ref 135–145)

## 2020-05-12 LAB — CBC
HCT: 34.4 % — ABNORMAL LOW (ref 36.0–46.0)
Hemoglobin: 11.7 g/dL — ABNORMAL LOW (ref 12.0–15.0)
MCH: 31.5 pg (ref 26.0–34.0)
MCHC: 34 g/dL (ref 30.0–36.0)
MCV: 92.5 fL (ref 80.0–100.0)
Platelets: 228 10*3/uL (ref 150–400)
RBC: 3.72 MIL/uL — ABNORMAL LOW (ref 3.87–5.11)
RDW: 13 % (ref 11.5–15.5)
WBC: 8.4 10*3/uL (ref 4.0–10.5)
nRBC: 0 % (ref 0.0–0.2)

## 2020-05-12 LAB — TROPONIN I (HIGH SENSITIVITY): Troponin I (High Sensitivity): 4 ng/L (ref <18)

## 2020-05-12 NOTE — Discharge Instructions (Addendum)
Based on your exam findings it is concerning that you had a stroke.  We are sending you to the ER by EMS for further evaluation and management of your stroke.

## 2020-05-12 NOTE — ED Notes (Signed)
Patient is being discharged from the Urgent Care and sent to the Emergency Department via EMS . Per Margarette Canada, NP, patient is in need of higher level of care due to . Patient is aware and verbalizes understanding of plan of care.  Vitals:   05/12/20 1447  BP: (!) 146/118  Pulse: (!) 115  Resp: (!) 22  Temp: 98.6 F (37 C)  SpO2: 98%

## 2020-05-12 NOTE — ED Provider Notes (Signed)
[facility]    CSN: [id number] Arrival date & time: 05/12/20  1408      History   Chief Complaint Chief Complaint  Patient presents with   Nausea   Dizziness    HPI Dorothy Rogers is a 69 y.o. female.   HPI   69 year old female here for evaluation of nausea and headache.  Patient initially describes it as "feeling off".  She reports that she feels better when she lays down and worse when she stands up or turns.  Patient is had vertigo in the past and states this is not like her vertigo.  She states that she has had nausea for the past 3 days but is also had night sweats for the last 4 to 5 days.  She states that she feels off balance and has some mild shortness of breath.  The shortness of breath is not new that is currently being evaluated.  Patient denies vomiting, fever, changes in vision, room spinning, chest pain, ringing in ears, changes to her hearing, diarrhea, constipation, difficulty urinating, fainting, or abdominal pain.  When trying to get the patient to describe what she is feeling in her head she states that it is across between being dizzy and disoriented.  Patient's husband states that she described it as "wonky".  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   Cataract    Chronic kidney disease    Stage III   Coronary artery disease    DDD (degenerative disc disease), lumbar    Diabetes mellitus without complication ([place])    TYPE 2   Herpes simplex    Hyperlipidemia    Hypertension    CONTROLLED ON MEDS   Vertigo    occasional, no episodes in 2-3 months   Vertigo    OCCASIONALLY   Vitamin B 12 deficiency     Patient Active Problem List   Diagnosis Date Noted   Asthma 07/31/2019   Esophageal dysphagia     Chronic left hip pain 05/17/2018   Elevated erythrocyte sedimentation rate 05/17/2018   OSA (obstructive sleep apnea) 12/07/2017   Bilateral sciatica 11/29/2016   Gastroparesis due to DM ([place]) 11/26/2016   Cholelithiasis    Protein-calorie malnutrition, severe 05/18/2016   Supraventricular tachycardia ([place]) 05/05/2016   History of paroxysmal supraventricular tachycardia 05/05/2016   SOB (shortness of breath)    Near syncope 04/07/2016   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 difficile colitis 06/16/2015   Intractable nausea and vomiting 06/09/2015   Dehydration 06/09/2015   Type 2 diabetes mellitus with diabetic neuropathy ([place]) 06/09/2015   Hyperlipemia 06/09/2015   Hypertension 06/09/2015   CAD (coronary artery disease) 06/09/2015   CKD (chronic kidney disease), stage III ([place]) 06/09/2015   Thrombocytopenia ([place]) 05/30/2015   Lumbar canal stenosis 05/30/2015   Disc disease with myelopathy, lumbar 05/30/2015   Presence of stent in coronary artery 05/30/2015   Sacroiliac joint dysfunction 02/10/2015   Status post lumbar spinal fusion 12/25/2014  DDD (degenerative disc disease), lumbar 11/18/2014   Lumbar post-laminectomy syndrome 11/18/2014   Facet syndrome, lumbar 11/18/2014   Piriformis syndrome of left side 11/18/2014   Status post lumbar spine operation 05/09/2014   Adiposity 04/23/2014   Thoracic and lumbosacral neuritis 03/27/2013   H/O arthrodesis 03/27/2013   Peripheral vascular disease ([place]) 03/22/2013   Hypomagnesemia 03/22/2013   Absolute anemia 03/22/2013    Past Surgical History:  Procedure Laterality Date   109 HOUR [place] STUDY N/A 02/04/2016   Procedure: 24 HOUR PH STUDY;  Surgeon:  Lucilla Lame, MD;  Location: [facility];  Service: Endoscopy;  Laterality: N/A;   ABDOMINAL HYSTERECTOMY     BACK SURGERY     Lumbar spinal fusion 05/30/2015   CARDIAC CATHETERIZATION  2009   CATARACT EXTRACTION Left    CATARACT EXTRACTION W/PHACO Right 03/19/2015   Procedure: CATARACT EXTRACTION PHACO AND INTRAOCULAR LENS PLACEMENT ([place]);  Surgeon: Leandrew Koyanagi, MD;  Location: [place];  Service: Ophthalmology;  Laterality: Right;  DIABETIC - insulin and oral meds   CHOLECYSTECTOMY N/A 05/19/2016   Procedure: LAPAROSCOPIC CHOLECYSTECTOMY;  Surgeon: Clayburn Pert, MD;  Location: [facility];  Service: General;  Laterality: N/A;   CORONARY STENT PLACEMENT  03/2008   CYSTECTOMY Right    wrist   ESOPHAGEAL MANOMETRY N/A 02/04/2016   Procedure: ESOPHAGEAL MANOMETRY (EM);  Surgeon: Lucilla Lame, MD;  Location: [facility];  Service: Endoscopy;  Laterality: N/A;   ESOPHAGOGASTRODUODENOSCOPY (EGD) WITH PROPOFOL N/A 09/04/2015   Procedure: ESOPHAGOGASTRODUODENOSCOPY (EGD) WITH PROPOFOL;  Surgeon: Lucilla Lame, MD;  Location: [place];  Service: Endoscopy;  Laterality: N/A;  INSULIN DEPENDENT DIABETIC   ESOPHAGOGASTRODUODENOSCOPY (EGD) WITH PROPOFOL N/A 06/29/2016   Procedure: ESOPHAGOGASTRODUODENOSCOPY (EGD) WITH PROPOFOL;  Surgeon: Jonathon Bellows, MD;  Location: [facility];  Service: Endoscopy;  Laterality: N/A;   ESOPHAGOGASTRODUODENOSCOPY (EGD) WITH PROPOFOL N/A 01/25/2019   Procedure: ESOPHAGOGASTRODUODENOSCOPY (EGD) WITH PROPOFOL;  Surgeon: Jonathon Bellows, MD;  Location: [facility] END[facility]COPY;  Service: Gastroenterology;  Laterality: N/A;   EYE SURGERY Left    cataract extraction   HIATAL HERNIA REPAIR  02/2016   Dr Adonis Huguenin   LEFT HEART CATH AND CORONARY ANGIOGRAPHY Left 08/18/2017   Procedure: LEFT HEART CATH AND CORONARY ANGIOGRAPHY;  Surgeon: Teodoro Spray, MD;  Location: [place] [facility];  Service: Cardiovascular;  Laterality: Left;   LUMBAR FUSION   2001, 2014, 2015   RIGHT HEART CATH N/A 05/10/2019   Procedure: RIGHT HEART CATH;  Surgeon: Teodoro Spray, MD;  Location: [place] [facility];  Service: Cardiovascular;  Laterality: N/A;   [place]     2001, 2014, 2015   TUMOR EXCISION     back of neck    OB History   No obstetric history on file.      Home Medications    Prior to Admission medications   Medication Sig Start Date End Date Taking? Authorizing Provider  acetaminophen (TYLENOL) 500 MG tablet Take 1,000 mg by mouth every 6 (six) hours as needed for mild pain or moderate pain.    Yes [provider]  acyclovir (ZOVIRAX) 400 MG tablet Take 400 mg by mouth daily as needed (for fever blisters).  08/07/15  Yes [provider]  aspirin EC 81 MG EC tablet Take 1 tablet (81 mg total) by mouth daily. Patient taking differently: Take 81 mg by mouth at bedtime. 05/21/16  Yes Gouru, Illene Silver, MD  atorvastatin (LIPITOR) 40 MG tablet Take 1 tablet by mouth once daily 08/11/19  Yes [provider]  Biotin 10 MG TABS Take by mouth.   Yes [provider]  Calcium Carb-Cholecalciferol (CALTRATE 600+D3 PO) Take 1 tablet by mouth daily.   Yes [provider]  Calcium Carbonate-Vitamin D 600-400 MG-UNIT tablet Take by mouth.   Yes [provider]  CARTIA XT 120 MG 24 hr capsule Take 120 mg by mouth daily. 12/13/17  Yes [provider]  Cholecalciferol (VITAMIN D-3) 125 MCG (5000 UT) TABS Take 5,000 Units by mouth daily.   Yes [provider]  clopidogrel (PLAVIX) 75 MG tablet Take 1 tablet by mouth once daily 09/30/19  Yes [provider]  conjugated estrogens (PREMARIN) vaginal cream Place 0.5 g vaginally 2 (two) times a week.   Yes [provider]  Cyanocobalamin (VITAMIN B-12) 5000 MCG SUBL Place 5,000 mcg under the tongue daily.   Yes [provider]  ferrous sulfate 325 (65 FE) MG tablet Take 325 mg by mouth daily with breakfast.   Yes  [provider]  glipiZIDE (GLUCOTROL) 10 MG tablet Take 10 mg by mouth 2 (two) times daily. Before breakfast & before supper   Yes [provider]  ibuprofen (ADVIL) 200 MG tablet Take 400 mg by mouth every 8 (eight) hours as needed (for pain.).   Yes [provider]  insulin glargine (LANTUS SOLOSTAR) 100 UNIT/ML Solostar Pen INJECT 36 TO 50 UNITS SUBCUTANEOUSLY ONCE DAILY AS DIRECTED 08/02/19  Yes [provider]  insulin lispro (HUMALOG) 100 UNIT/ML injection Inject into the skin 3 (three) times daily before meals. Sliding scale   Yes [provider]  loperamide (IMODIUM) 2 MG capsule Take by mouth.   Yes [provider]  losartan (COZAAR) 100 MG tablet Take 1 tablet by mouth once daily 08/27/19  Yes [provider]  melatonin 1 MG TABS tablet Take by mouth.   Yes [provider]  modafinil (PROVIGIL) 100 MG tablet Take by mouth. 10/09/19 10/08/20 Yes [provider]  ondansetron (ZOFRAN-ODT) 8 MG disintegrating tablet Take 8 mg by mouth as needed for nausea or vomiting.   Yes [provider]  Probiotic CAPS Take 1 capsule by mouth daily.   Yes [provider]  diazepam (VALIUM) 2 MG tablet Take by mouth. 06/05/19   [provider]    Family History Family History  Problem Relation Age of Onset   Heart disease Father    Diabetes Other    Breast cancer Neg Hx     Social History Social History   Tobacco Use   Smoking status: Never Smoker   Smokeless tobacco: Never Used  Vaping Use   Vaping Use: Never used  Substance Use Topics   Alcohol use: Yes    Alcohol/week: 0.0 standard drinks    Comment: 2-3 drinks per year   Drug use: Never     Allergies   Sildenafil, Tramadol, Beta adrenergic blockers, Carbamazepine, Celebrex [celecoxib], Gabapentin, Niacin and related, Oxycodone, Shellfish allergy, Vicodin [hydrocodone-acetaminophen], and Heparin   Review of Systems Review  of Systems  Constitutional: Negative for activity change.  HENT: Negative for congestion and tinnitus.   Eyes: Negative for photophobia and visual disturbance.  Respiratory: Positive for shortness of breath. Negative for cough and wheezing.   Cardiovascular: Negative for chest pain.  Gastrointestinal: Positive for nausea. Negative for constipation, diarrhea and vomiting.  Genitourinary: Negative for dysuria.  Musculoskeletal: Negative for arthralgias and back pain.  Skin: Negative for rash.  Neurological: Positive for dizziness, light-headedness and headaches. Negative for  syncope, facial asymmetry, speech difficulty and weakness.  Hematological: Negative.   Psychiatric/Behavioral: Negative.      Physical Exam Triage Vital Signs ED Triage Vitals  Enc Vitals Group     BP 05/12/20 1447 (!) 146/118     Pulse Rate 05/12/20 1447 (!) 115     Resp 05/12/20 1447 (!) 22     Temp 05/12/20 1447 98.6 F (37 C)     Temp Source 05/12/20 1447 Oral     SpO2 05/12/20 1447 98 %     Weight 05/12/20 1448 178 lb (80.7 kg)     Height 05/12/20 1448 5\' 2"  (1.575 m)     Head Circumference --      Peak Flow --      Pain Score 05/12/20 1448 0     Pain Loc --      Pain Edu? --      Excl. in [place]? --    No data found.  Updated Vital Signs BP (!) 146/118 (BP Location: Right Arm)    Pulse (!) 115    Temp 98.6 F (37 C) (Oral)    Resp (!) 22    Ht 5\' 2"  (1.575 m)    Wt 178 lb (80.7 kg)    SpO2 98%    BMI 32.56 kg/m   Visual Acuity Right Eye Distance:   Left Eye Distance:   Bilateral Distance:    Right Eye Near:   Left Eye Near:    Bilateral Near:     Physical Exam Vitals and nursing note reviewed.  Constitutional:      General: She is not in acute distress.    Appearance: Normal appearance. She is not toxic-appearing.  HENT:     Head: Normocephalic and atraumatic.     Right Ear: Tympanic membrane, ear canal and external ear normal.     Left Ear: Tympanic membrane, ear canal and external ear  normal.     Mouth/Throat:     Mouth: Mucous membranes are moist.     Pharynx: Oropharynx is clear. No oropharyngeal exudate or posterior oropharyngeal erythema.  Eyes:     General: No scleral icterus.       Right eye: No discharge.        Left eye: No discharge.     Conjunctiva/sclera: Conjunctivae normal.     Comments: Patient has vertical nystagmus when looking to the right and up.  Patient also states that this makes her discomfort in her head worse.  Cardiovascular:     Rate and Rhythm: Regular rhythm. Tachycardia present.     Pulses: Normal pulses.  Pulmonary:     Effort: Pulmonary effort is normal.     Breath sounds: Normal breath sounds. No wheezing, rhonchi or rales.  Musculoskeletal:        General: No swelling or tenderness. Normal range of motion.     Cervical back: Normal range of motion and neck supple.  Skin:    General: Skin is warm.     Capillary Refill: Capillary refill takes less than 2 seconds.  Neurological:     General: No focal deficit present.     Mental Status: She is alert and oriented to person, place, and time.     Cranial Nerves: No cranial nerve deficit.     Motor: Weakness present.     Deep Tendon Reflexes: Reflexes abnormal.  Psychiatric:        Mood and Affect: Mood normal.        Behavior: Behavior  normal.        Thought Content: Thought content normal.        Judgment: Judgment normal.      UC Treatments / Results  Labs (all labs ordered are listed, but only abnormal results are displayed) Labs Reviewed - No data to display  EKG   Radiology No results found.  Procedures Procedures (including critical care time)  Medications Ordered in UC Medications - No data to display  Initial Impression / Assessment and Plan / UC Course  I have reviewed the triage vital signs and the nursing notes.  Pertinent labs & imaging results that were available during my care of the patient were reviewed by me and considered in my medical decision  making (see chart for details).   Nausea, headache, and feeling off.  The symptoms have been going on somewhere between 3 and 5 days.  Physical exam reveals vertical nystagmus when she moves her eyes to the right and looks up.  Patient's pupils are equal round reactive.  Patient has no other cranial nerve deficits.  Patient does have decreased grip strength and pedal pushers on the left.  Left grip is 3/5, right 5/5, pedal pressures as well.  Patient also has decreased deep tendon reflexes at the brachial radial, and patellar.  Patient has a left arm pronator drift and a positive Romberg.  His physical exam is concerning for CVA.  Will send to the emergency department for further evaluation and possible admission.   EKG shows sinus tachycardia with a rate of 102.  Normal axis.  No changes when compared to 05/10/2019 EKG.  Will send to ER by EMS.  Report given to paramedic. Final Clinical Impressions(s) / UC Diagnoses   Final diagnoses:  Dizziness     Discharge Instructions     Based on your exam findings it is concerning that you had a stroke.  We are sending you to the ER by EMS for further evaluation and management of your stroke.    ED Prescriptions    None     PDMP not reviewed this encounter.   Margarette Canada, NP 05/12/20 1606

## 2020-05-12 NOTE — ED Triage Notes (Signed)
Pt comes into the ED via EMS from [facility] urgent care with c/o feeling dizziness, "my head just doesn't feel right". With nausea for the past 2 days. Pt denies any weakness or numbness at this time.

## 2020-05-12 NOTE — ED Provider Notes (Signed)
[facility] Emergency Department Provider Note   ____________________________________________   Event Date/Time   First MD Initiated Contact with Patient 05/12/20 2349     (approximate)  I have reviewed the triage vital signs and the nursing notes.   HISTORY  Chief Complaint Dizziness    HPI Dorothy Rogers is a 69 y.o. female with past medical history of hypertension, hyperlipidemia, diabetes, CAD, vertigo, and CKD who presents to the ED for dizziness and nausea.  Patient states that for the past 2 days she has been dealing with a feeling in her head that "just does not feel right."  She denies feeling dizzy but states she feels nauseous with any movement with an unusual feeling in her chest.  She did not have any headache until earlier this evening.  She has not had any vomiting and denies any abdominal pain or diarrhea.  She does states she has had sweating at night, but denies any fevers, cough, chest pain, shortness of breath, dysuria, or hematuria.  She was initially evaluated at urgent care, where there was concern for left-sided deficits and she was referred to the ED for further evaluation for stroke.  Patient denies any facial droop, numbness, weakness, or vision changes.  She states current symptoms are not similar to when she has dealt with vertigo in the past.        Past Medical History:  Diagnosis Date  . Anemia   . Anemia   . Arthritis    lower back, hips  . Blood transfusion without reported diagnosis   . CAD (coronary artery disease) FEB AND NOV 2009   6 STENTS  . 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 Cataract   . Chronic kidney disease    Stage III  . Coronary artery disease   . DDD (degenerative disc disease), lumbar   . Diabetes mellitus without complication ([place])    TYPE 2  . Herpes simplex   . Hyperlipidemia   . Hypertension    CONTROLLED ON MEDS  . Vertigo    occasional, no episodes in 2-3 months  .  Vertigo    OCCASIONALLY  . Vitamin B 12 deficiency     Patient Active Problem List   Diagnosis Date Noted  . Asthma 07/31/2019  . Esophageal dysphagia   . Chronic left hip pain 05/17/2018  . Elevated erythrocyte sedimentation rate 05/17/2018  . OSA (obstructive sleep apnea) 12/07/2017  . Bilateral sciatica 11/29/2016  . Gastroparesis due to DM ([place]) 11/26/2016  . Cholelithiasis   . Protein-calorie malnutrition, severe 05/18/2016  . Supraventricular tachycardia ([place]) 05/05/2016  . History of paroxysmal supraventricular tachycardia 05/05/2016  . SOB (shortness of breath)   . Near syncope 04/07/2016  . 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 Non-intractable cyclical vomiting with nausea   . Congenital esophageal defect   . Hiatal hernia   . Gastritis   . Diarrhea 07/10/2015  . Diabetes mellitus ([place]) 07/02/2015  . Adjustment disorder 06/16/2015  . Abdominal pain 06/16/2015  . C. difficile colitis 06/16/2015  . Intractable nausea and vomiting 06/09/2015  . Dehydration 06/09/2015  . Type 2 diabetes mellitus with diabetic neuropathy ([place]) 06/09/2015  . Hyperlipemia 06/09/2015  . Hypertension 06/09/2015  . CAD (coronary artery disease) 06/09/2015  . CKD (chronic kidney disease), stage III ([place]) 06/09/2015  . Thrombocytopenia ([place]) 05/30/2015  . Lumbar canal stenosis 05/30/2015  . Disc disease with myelopathy, lumbar 05/30/2015  . Presence of stent in coronary artery 05/30/2015  .  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 Status post lumbar spinal fusion 12/25/2014  . DDD (degenerative disc disease), lumbar 11/18/2014  . Lumbar post-laminectomy syndrome 11/18/2014  . Facet syndrome, lumbar 11/18/2014  . Piriformis syndrome of left side 11/18/2014  . Status post lumbar spine operation 05/09/2014  . Adiposity 04/23/2014  . Thoracic and lumbosacral neuritis 03/27/2013  . H/O arthrodesis 03/27/2013  . Peripheral vascular disease ([place]) 03/22/2013  . Hypomagnesemia 03/22/2013  .  Absolute anemia 03/22/2013    Past Surgical History:  Procedure Laterality Date  . [place] STUDY N/A 02/04/2016   Procedure: [place] STUDY;  Surgeon: Lucilla Lame, MD;  Location: [facility] ENDOSCOPY;  Service: Endoscopy;  Laterality: N/A;  . ABDOMINAL HYSTERECTOMY    . BACK SURGERY     Lumbar spinal fusion 05/30/2015  . CARDIAC CATHETERIZATION  2009  . CATARACT EXTRACTION Left   . CATARACT EXTRACTION W/PHACO Right 03/19/2015   Procedure: CATARACT EXTRACTION PHACO AND INTRAOCULAR LENS PLACEMENT ([place]);  Surgeon: Leandrew Koyanagi, MD;  Location: [place];  Service: Ophthalmology;  Laterality: Right;  DIABETIC - insulin and oral meds  . CHOLECYSTECTOMY N/A 05/19/2016   Procedure: LAPAROSCOPIC CHOLECYSTECTOMY;  Surgeon: Clayburn Pert, MD;  Location: [facility] ORS;  Service: General;  Laterality: N/A;  . CORONARY STENT PLACEMENT  03/2008  . CYSTECTOMY Right    wrist  . ESOPHAGEAL MANOMETRY N/A 02/04/2016   Procedure: ESOPHAGEAL MANOMETRY (EM);  Surgeon: Lucilla Lame, MD;  Location: [facility] ENDOSCOPY;  Service: Endoscopy;  Laterality: N/A;  . ESOPHAGOGASTRODUODENOSCOPY (EGD) WITH PROPOFOL N/A 09/04/2015   Procedure: ESOPHAGOGASTRODUODENOSCOPY (EGD) WITH PROPOFOL;  Surgeon: Lucilla Lame, MD;  Location: [place];  Service: Endoscopy;  Laterality: N/A;  INSULIN DEPENDENT DIABETIC  . ESOPHAGOGASTRODUODENOSCOPY (EGD) WITH PROPOFOL N/A 06/29/2016   Procedure: ESOPHAGOGASTRODUODENOSCOPY (EGD) WITH PROPOFOL;  Surgeon: Jonathon Bellows, MD;  Location: [facility] ENDOSCOPY;  Service: Endoscopy;  Laterality: N/A;  . ESOPHAGOGASTRODUODENOSCOPY (EGD) WITH PROPOFOL N/A 01/25/2019   Procedure: ESOPHAGOGASTRODUODENOSCOPY (EGD) WITH PROPOFOL;  Surgeon: Jonathon Bellows, MD;  Location: [facility];  Service: Gastroenterology;  Laterality: N/A;  . EYE SURGERY Left    cataract extraction  . HIATAL HERNIA REPAIR  02/2016   Dr Adonis Huguenin  . [place] CATH AND CORONARY ANGIOGRAPHY Left 08/18/2017   Procedure: LEFT HEART  CATH AND CORONARY ANGIOGRAPHY;  Surgeon: Teodoro Spray, MD;  Location: [place] CV LAB;  Service: Cardiovascular;  Laterality: Left;  . LUMBAR FUSION  2001, 2014, 2015  . RIGHT HEART CATH N/A 05/10/2019   Procedure: RIGHT HEART CATH;  Surgeon: Teodoro Spray, MD;  Location: [place] CV LAB;  Service: Cardiovascular;  Laterality: N/A;  . SPINE SURGERY     2001, 2014, 2015  . TUMOR EXCISION     back of neck    Prior to Admission medications   Medication Sig Start Date End Date Taking? Authorizing Provider  acetaminophen (TYLENOL) 500 MG tablet Take 1,000 mg by mouth every 6 (six) hours as needed for mild pain or moderate pain.     [provider]  acyclovir (ZOVIRAX) 400 MG tablet Take 400 mg by mouth daily as needed (for fever blisters).  08/07/15   [provider]  aspirin EC 81 MG EC tablet Take 1 tablet (81 mg total) by mouth daily. Patient taking differently: Take 81 mg by mouth at bedtime. 05/21/16   Nicholes Mango, MD  atorvastatin (LIPITOR) 40 MG tablet Take 1 tablet by mouth once daily 08/11/19   [provider]  Biotin 10  MG TABS Take by mouth.    [provider]  Calcium Carb-Cholecalciferol (CALTRATE 600+D3 PO) Take 1 tablet by mouth daily.    [provider]  Calcium Carbonate-Vitamin D 600-400 MG-UNIT tablet Take by mouth.    [provider]  CARTIA XT 120 MG 24 hr capsule Take 120 mg by mouth daily. 12/13/17   [provider]  Cholecalciferol (VITAMIN D-3) 125 MCG (5000 UT) TABS Take 5,000 Units by mouth daily.    [provider]  clopidogrel (PLAVIX) 75 MG tablet Take 1 tablet by mouth once daily 09/30/19   [provider]  conjugated estrogens (PREMARIN) vaginal cream Place 0.5 g vaginally 2 (two) times a week.    [provider]  Cyanocobalamin (VITAMIN B-12) 5000 MCG SUBL Place 5,000 mcg under the tongue daily.    [provider]  diazepam (VALIUM) 2 MG tablet Take by  mouth. 06/05/19   [provider]  ferrous sulfate 325 (65 FE) MG tablet Take 325 mg by mouth daily with breakfast.    [provider]  glipiZIDE (GLUCOTROL) 10 MG tablet Take 10 mg by mouth 2 (two) times daily. Before breakfast & before supper    [provider]  ibuprofen (ADVIL) 200 MG tablet Take 400 mg by mouth every 8 (eight) hours as needed (for pain.).    [provider]  insulin glargine (LANTUS SOLOSTAR) 100 UNIT/ML Solostar Pen INJECT 36 TO 50 UNITS SUBCUTANEOUSLY ONCE DAILY AS DIRECTED 08/02/19   [provider]  insulin lispro (HUMALOG) 100 UNIT/ML injection Inject into the skin 3 (three) times daily before meals. Sliding scale    [provider]  loperamide (IMODIUM) 2 MG capsule Take by mouth.    [provider]  losartan (COZAAR) 100 MG tablet Take 1 tablet by mouth once daily 08/27/19   [provider]  melatonin 1 MG TABS tablet Take by mouth.    [provider]  ondansetron (ZOFRAN-ODT) 8 MG disintegrating tablet Take 8 mg by mouth as needed for nausea or vomiting.    [provider]  Probiotic CAPS Take 1 capsule by mouth daily.    [provider]  promethazine (PHENERGAN) 12.5 MG tablet Take 1 tablet (12.5 mg total) by mouth every 6 (six) hours as needed for nausea or vomiting. 05/13/20   Blake Divine, MD  modafinil (PROVIGIL) 100 MG tablet Take by mouth. 10/09/19 05/12/20  [provider]    Allergies Sildenafil, Tramadol, Beta adrenergic blockers, Carbamazepine, Celebrex [celecoxib], Gabapentin, Niacin and related, Oxycodone, Shellfish allergy, Vicodin [hydrocodone-acetaminophen], and Heparin  Family History  Problem Relation Age of Onset  . Heart disease Father   . Diabetes Other   . Breast cancer Neg Hx     Social History Social History   Tobacco Use  . Smoking status: Never Smoker  . Smokeless tobacco: Never Used  Vaping Use  . Vaping Use: Never used   Substance Use Topics  . Alcohol use: Yes    Alcohol/week: 0.0 standard drinks    Comment: 2-3 drinks per year  . Drug use: Never    Review of Systems  Constitutional: No fever/chills.  Positive for malaise. Eyes: No visual changes. ENT: No sore throat. Cardiovascular: Denies chest pain. Respiratory: Denies shortness of breath. Gastrointestinal: No abdominal pain.  Positive for nausea, no vomiting.  No diarrhea.  No constipation. Genitourinary: Negative for dysuria. Musculoskeletal: Negative for back pain. Skin: Negative for rash. Neurological: Positive for headache, negative for focal weakness or numbness.  ____________________________________________   PHYSICAL EXAM:  VITAL SIGNS: ED Triage Vitals  Enc Vitals Group     BP 05/12/20 1728 (!) 151/84     Pulse Rate 05/12/20 1728 (!) 115     Resp 05/12/20 1728 20     Temp 05/12/20 1728 98.9 F (37.2 C)     Temp Source 05/12/20 1728 Oral     SpO2 05/12/20 1728 100 %     Weight 05/12/20 1729 178 lb (80.7 kg)     Height 05/12/20 1729 5\' 2"  (1.575 m)     Head Circumference --      Peak Flow --      Pain Score 05/12/20 1729 0     Pain Loc --      Pain Edu? --      Excl. in [place]? --     Constitutional: Alert and oriented. Eyes: Conjunctivae are normal.  Pupils equal round and reactive to light bilaterally, extraocular movements intact without nystagmus. Head: Atraumatic. Nose: No congestion/rhinnorhea. Mouth/Throat: Mucous membranes are moist. Neck: Normal ROM Cardiovascular: Normal rate, regular rhythm. Grossly normal heart sounds.  2+ radial pulses bilaterally. Respiratory: Normal respiratory effort.  No retractions. Lungs CTAB. Gastrointestinal: Soft and nontender. No distention. Genitourinary: deferred Musculoskeletal: No lower extremity tenderness nor edema. Neurologic:  Normal speech and language.  4-5 strength in left upper extremity with pronator drift noted, 5 out of 5 strength in right upper extremity and  bilateral lower extremities. Skin:  Skin is warm, dry and intact. No rash noted. Psychiatric: Mood and affect are normal. Speech and behavior are normal.  ____________________________________________   LABS (all labs ordered are listed, but only abnormal results are displayed)  Labs Reviewed  BASIC METABOLIC PANEL - Abnormal; Notable for the following components:      Result Value   CO2 21 (*)    Glucose, Bld 182 (*)    BUN 28 (*)    Creatinine, Ser 1.58 (*)    GFR, Estimated 35 (*)    All other components within normal limits  CBC - Abnormal; Notable for the following components:   RBC 3.72 (*)    Hemoglobin 11.7 (*)    HCT 34.4 (*)    All other components within normal limits  URINALYSIS, COMPLETE (UACMP) WITH MICROSCOPIC - Abnormal; Notable for the following components:   Color, Urine YELLOW (*)    APPearance CLOUDY (*)    Leukocytes,Ua SMALL (*)    Bacteria, UA RARE (*)    All other components within normal limits  CBG MONITORING, ED - Abnormal; Notable for the following components:   Glucose-Capillary 166 (*)    All other components within normal limits  RESP PANEL BY RT-PCR (FLU A&B, COVID) ARPGX2  HEPATIC FUNCTION PANEL  LIPASE, BLOOD  TROPONIN I (HIGH SENSITIVITY)   ____________________________________________  EKG  ED ECG REPORT I, Blake Divine, the attending physician, personally viewed and interpreted this ECG.   Date: 05/12/2020  EKG Time: 14:59  Rate: 102  Rhythm: sinus tachycardia  Axis: Normal  Intervals:none  ST&T Change: None   PROCEDURES  Procedure(s) performed (including Critical Care):  Procedures   ____________________________________________   INITIAL IMPRESSION / ASSESSMENT AND PLAN / ED COURSE       69 year old female with past medical history of hypertension, hyperlipidemia, diabetes, CAD, vertigo, and CKD who presents to the ED with her head "not feeling right" with nausea upon movement for the past few days.  Patient  initially evaluated at urgent care where there was concern  for nystagmus and left-sided weakness.  On my exam, she does seem to have weakness in her left upper extremity with pronator drift.  No abnormalities noted with ocular exam.  CT head reviewed by me and shows no obvious hemorrhage, negative for acute process per radiology.  EKG and labs are unremarkable, renal function stable compared to previous.  We will check UA and further assess for stroke with MRI brain.  We will attempt to control patient's symptoms with IV fluids, Zofran, and meclizine.  UA without evidence of infection.  MRI shows no acute stroke or other process to explain patient's symptoms.  Patient unfortunately with no improvement in her symptoms following IV fluids, Zofran, and meclizine.  She continues to complain of headache and nausea, especially when she gets up to move.  Current symptoms could represent migraine headache and patient treated with Phenergan.  She now reports symptoms are much improved.  LFTs and lipase are within normal limits, patient continues to deny abdominal pain.  Given unremarkable work-up and improving symptoms, patient is appropriate for discharge home with PCP follow-up.  She was counseled to return to the ED for new worsening symptoms, patient agrees with plan.      ____________________________________________   FINAL CLINICAL IMPRESSION(S) / ED DIAGNOSES  Final diagnoses:  Non-intractable vomiting with nausea, unspecified vomiting type  Acute nonintractable headache, unspecified headache type     ED Discharge Orders         Ordered    promethazine (PHENERGAN) 12.5 MG tablet  Every 6 hours PRN        05/13/20 0450           Note:  This document was prepared using Dragon voice recognition software and may include unintentional dictation errors.   Blake Divine, MD 05/13/20 [phone]

## 2020-05-12 NOTE — ED Triage Notes (Signed)
Patient c/o nausea and states her head just doesn't feel right. She states when she lays down she feels better but as soon as she gets up her head starts feeling "off." She has not had any episodes of vomiting. She reports night sweats x 4-5 days.

## 2020-05-12 NOTE — ED Triage Notes (Signed)
First Nurse Note:  Sent from [facility] for ED evaluation of left arm weakness.  Patient c/o nausea and not feeling well  X 4 days.  Per EMS:  P:  120  BP  170/85.  12 lead :   ST.  Patient only complaint is nausea.

## 2020-05-12 NOTE — ED Notes (Signed)
EKG performed at the urgent care and is on pt chart.

## 2020-05-13 ENCOUNTER — Emergency Department: Payer: Medicare Other

## 2020-05-13 DIAGNOSIS — R519 Headache, unspecified: Secondary | ICD-10-CM | POA: Diagnosis not present

## 2020-05-13 LAB — URINALYSIS, COMPLETE (UACMP) WITH MICROSCOPIC
Bilirubin Urine: NEGATIVE
Glucose, UA: NEGATIVE mg/dL
Hgb urine dipstick: NEGATIVE
Ketones, ur: NEGATIVE mg/dL
Nitrite: NEGATIVE
Protein, ur: NEGATIVE mg/dL
Specific Gravity, Urine: 1.021 (ref 1.005–1.030)
pH: 5 (ref 5.0–8.0)

## 2020-05-13 LAB — RESP PANEL BY RT-PCR (FLU A&B, COVID) ARPGX2
Influenza A by PCR: NEGATIVE
Influenza B by PCR: NEGATIVE
SARS Coronavirus 2 by RT PCR: NEGATIVE

## 2020-05-13 LAB — HEPATIC FUNCTION PANEL
ALT: 21 U/L (ref 0–44)
AST: 19 U/L (ref 15–41)
Albumin: 3.9 g/dL (ref 3.5–5.0)
Alkaline Phosphatase: 69 U/L (ref 38–126)
Bilirubin, Direct: <0.1 mg/dL (ref 0.0–0.2)
Total Bilirubin: 0.9 mg/dL (ref 0.3–1.2)
Total Protein: 7.3 g/dL (ref 6.5–8.1)

## 2020-05-13 LAB — LIPASE, BLOOD: Lipase: 30 U/L (ref 11–51)

## 2020-05-13 LAB — CBG MONITORING, ED: Glucose-Capillary: 166 mg/dL — ABNORMAL HIGH (ref 70–99)

## 2020-05-13 MED ORDER — ONDANSETRON HCL 4 MG/2ML IJ SOLN
4.0000 mg | Freq: Once | INTRAMUSCULAR | Status: AC
  Administered 2020-05-13: 01:00:00 4 mg via INTRAVENOUS
  Filled 2020-05-13: qty 2

## 2020-05-13 MED ORDER — PROMETHAZINE HCL 25 MG/ML IJ SOLN
12.5000 mg | Freq: Once | INTRAMUSCULAR | Status: AC
  Administered 2020-05-13: 04:00:00 12.5 mg via INTRAVENOUS
  Filled 2020-05-13: qty 1

## 2020-05-13 MED ORDER — PROMETHAZINE HCL 12.5 MG PO TABS: 12.5000 mg | ORAL_TABLET | Freq: Four times a day (QID) | ORAL | 0 refills | Status: DC | PRN

## 2020-05-13 MED ORDER — MECLIZINE HCL 25 MG PO TABS
25.0000 mg | ORAL_TABLET | Freq: Once | ORAL | Status: AC
  Administered 2020-05-13: 01:00:00 25 mg via ORAL
  Filled 2020-05-13: qty 1

## 2020-05-13 MED ORDER — LACTATED RINGERS IV BOLUS
1000.0000 mL | Freq: Once | INTRAVENOUS | Status: AC
  Administered 2020-05-13: 01:00:00 1000 mL via INTRAVENOUS

## 2020-07-10 ENCOUNTER — Other Ambulatory Visit: Payer: Self-pay | Admitting: Internal Medicine

## 2020-07-10 ENCOUNTER — Other Ambulatory Visit (HOSPITAL_COMMUNITY): Payer: Self-pay | Admitting: Internal Medicine

## 2020-07-10 DIAGNOSIS — R1310 Dysphagia, unspecified: Secondary | ICD-10-CM

## 2020-07-17 ENCOUNTER — Other Ambulatory Visit: Payer: Self-pay

## 2020-07-17 ENCOUNTER — Ambulatory Visit
Admission: RE | Admit: 2020-07-17 | Discharge: 2020-07-17 | Disposition: A | Discharge status: Home / Self Care | Payer: Medicare Other | Source: Ambulatory Visit | Attending: Internal Medicine | Admitting: Internal Medicine

## 2020-07-17 DIAGNOSIS — R1310 Dysphagia, unspecified: Secondary | ICD-10-CM | POA: Insufficient documentation

## 2020-08-07 ENCOUNTER — Other Ambulatory Visit: Payer: Self-pay | Admitting: Physical Medicine & Rehabilitation

## 2020-08-07 DIAGNOSIS — M5414 Radiculopathy, thoracic region: Secondary | ICD-10-CM

## 2020-08-07 DIAGNOSIS — M546 Pain in thoracic spine: Secondary | ICD-10-CM

## 2020-08-08 ENCOUNTER — Ambulatory Visit
Admission: RE | Admit: 2020-08-08 | Discharge: 2020-08-08 | Disposition: A | Discharge status: Home / Self Care | Payer: Medicare Other | Source: Ambulatory Visit | Attending: Physical Medicine & Rehabilitation | Admitting: Physical Medicine & Rehabilitation

## 2020-08-08 ENCOUNTER — Other Ambulatory Visit: Payer: Self-pay

## 2020-08-08 DIAGNOSIS — M5414 Radiculopathy, thoracic region: Secondary | ICD-10-CM | POA: Insufficient documentation

## 2020-08-08 DIAGNOSIS — M546 Pain in thoracic spine: Secondary | ICD-10-CM | POA: Insufficient documentation

## 2020-09-18 ENCOUNTER — Other Ambulatory Visit: Admission: RE | Admit: 2020-09-18 | Payer: Medicare Other | Source: Ambulatory Visit

## 2020-09-22 ENCOUNTER — Encounter: Admission: RE | Payer: Self-pay | Source: Home / Self Care

## 2020-09-22 ENCOUNTER — Ambulatory Visit: Admission: RE | Admit: 2020-09-22 | Payer: Medicare Other | Source: Home / Self Care | Admitting: Internal Medicine

## 2020-09-22 SURGERY — ESOPHAGOGASTRODUODENOSCOPY (EGD) WITH PROPOFOL
Anesthesia: General

## 2020-10-23 ENCOUNTER — Encounter: Payer: Self-pay | Admitting: *Deleted

## 2020-10-29 ENCOUNTER — Encounter: Payer: Medicare Other | Attending: Pulmonary Disease | Admitting: *Deleted

## 2020-10-29 ENCOUNTER — Other Ambulatory Visit: Payer: Self-pay

## 2020-10-29 DIAGNOSIS — I272 Pulmonary hypertension, unspecified: Secondary | ICD-10-CM | POA: Insufficient documentation

## 2020-10-29 NOTE — Progress Notes (Signed)
Virtual orientation call completed today. shehas an appointment on Date: 11/05/2020 for EP eval and gym Orientation.  Documentation of diagnosis can be found in [facility] Date: 10/01/2020.

## 2020-11-05 ENCOUNTER — Other Ambulatory Visit: Payer: Self-pay

## 2020-11-05 ENCOUNTER — Encounter: Payer: Medicare Other | Admitting: *Deleted

## 2020-11-05 VITALS — Ht 62.5 in | Wt 178.5 lb

## 2020-11-05 DIAGNOSIS — I272 Pulmonary hypertension, unspecified: Secondary | ICD-10-CM | POA: Diagnosis present

## 2020-11-05 NOTE — Patient Instructions (Signed)
Patient Instructions  Patient Details  Name: Dorothy Rogers MRN: [id number] Date of Birth: May 24, 1951 Referring Provider:  Ottie Glazier, MD  Below are your personal goals for exercise, nutrition, and risk factors. Our goal is to help you stay on track towards obtaining and maintaining these goals. We will be discussing your progress on these goals with you throughout the program.  Initial Exercise Prescription:  Initial Exercise Prescription - 11/05/20 1500      Date of Initial Exercise RX and Referring Provider   Date 11/05/20    Referring Provider Zetta Bills MD      Recumbant Bike   Level 1    RPM 50    Watts 5    Minutes 15    METs 2      NuStep   Level 1    SPM 80    Minutes 15    METs 2      REL-XR   Level 1    Speed 50    Minutes 15    METs 2      Track   Laps 8    Minutes 15    METs 1.4      Prescription Details   Frequency (times per week) 3    Duration Progress to 30 minutes of continuous aerobic without signs/symptoms of physical distress      Intensity   THRR 40-80% of Max Heartrate 117-139    Ratings of Perceived Exertion 11-13    Perceived Dyspnea 0-4      Progression   Progression Continue to progress workloads to maintain intensity without signs/symptoms of physical distress.      Resistance Training   Training Prescription Yes    Weight 3 lb    Reps 10-15           Exercise Goals: Frequency: Be able to perform aerobic exercise two to three times per week in program working toward 2-5 days per week of home exercise.  Intensity: Work with a perceived exertion of 11 (fairly light) - 15 (hard) while following your exercise prescription.  We will make changes to your prescription with you as you progress through the program.   Duration: Be able to do 30 to 45 minutes of continuous aerobic exercise in addition to a 5 minute warm-up and a 5 minute cool-down routine.   Nutrition Goals: Your personal nutrition goals will be  established when you do your nutrition analysis with the dietician.  The following are general nutrition guidelines to follow: Cholesterol < 200mg /day Sodium < 1500mg /day Fiber: Women over 50 yrs - 21 grams per day  Personal Goals:  Personal Goals and Risk Factors at Admission - 11/05/20 1514      Core Components/Risk Factors/Patient Goals on Admission    Weight Management Yes;Obesity;Weight Loss    Intervention Weight Management: Develop a combined nutrition and exercise program designed to reach desired caloric intake, while maintaining appropriate intake of nutrient and fiber, sodium and fats, and appropriate energy expenditure required for the weight goal.;Weight Management/Obesity: Establish reasonable short term and long term weight goals.;Weight Management: Provide education and appropriate resources to help participant work on and attain dietary goals.;Obesity: Provide education and appropriate resources to help participant work on and attain dietary goals.    Admit Weight 178 lb 8 oz (81 kg)    Goal Weight: Short Term 170 lb (77.1 kg)    Goal Weight: Long Term 165 lb (74.8 kg)    Expected Outcomes Short Term: Continue to assess  and modify interventions until short term weight is achieved;Long Term: Adherence to nutrition and physical activity/exercise program aimed toward attainment of established weight goal;Weight Loss: Understanding of general recommendations for a balanced deficit meal plan, which promotes 1-2 lb weight loss per week and includes a negative energy balance of [id number] kcal/d;Understanding recommendations for meals to include 15-35% energy as protein, 25-35% energy from fat, 35-60% energy from carbohydrates, less than 200mg  of dietary cholesterol, 20-35 gm of total fiber daily;Understanding of distribution of calorie intake throughout the day with the consumption of 4-5 meals/snacks    Improve shortness of breath with ADL's Yes    Intervention Provide education,  individualized exercise plan and daily activity instruction to help decrease symptoms of SOB with activities of daily living.    Expected Outcomes Short Term: Improve cardiorespiratory fitness to achieve a reduction of symptoms when performing ADLs;Long Term: Be able to perform more ADLs without symptoms or delay the onset of symptoms    Diabetes Yes    Intervention Provide education about signs/symptoms and action to take for hypo/hyperglycemia.;Provide education about proper nutrition, including hydration, and aerobic/resistive exercise prescription along with prescribed medications to achieve blood glucose in normal ranges: Fasting glucose 65-99 mg/dL    Expected Outcomes Short Term: Participant verbalizes understanding of the signs/symptoms and immediate care of hyper/hypoglycemia, proper foot care and importance of medication, aerobic/resistive exercise and nutrition plan for blood glucose control.;Long Term: Attainment of HbA1C < 7%.    Hypertension Yes    Intervention Provide education on lifestyle modifcations including regular physical activity/exercise, weight management, moderate sodium restriction and increased consumption of fresh fruit, vegetables, and low fat dairy, alcohol moderation, and smoking cessation.;Monitor prescription use compliance.    Expected Outcomes Short Term: Continued assessment and intervention until BP is < 140/29mm HG in hypertensive participants. < 130/55mm HG in hypertensive participants with diabetes, heart failure or chronic kidney disease.;Long Term: Maintenance of blood pressure at goal levels.    Lipids Yes    Intervention Provide education and support for participant on nutrition & aerobic/resistive exercise along with prescribed medications to achieve LDL 70mg , HDL >40mg .    Expected Outcomes Short Term: Participant states understanding of desired cholesterol values and is compliant with medications prescribed. Participant is following exercise prescription  and nutrition guidelines.;Long Term: Cholesterol controlled with medications as prescribed, with individualized exercise RX and with personalized nutrition plan. Value goals: LDL < 70mg , HDL > 40 mg.           Tobacco Use Initial Evaluation: Social History   Tobacco Use  Smoking Status Never Smoker  Smokeless Tobacco Never Used    Exercise Goals and Review:  Exercise Goals    Row Name 11/05/20 1512             Exercise Goals   Increase Physical Activity Yes       Intervention Provide advice, education, support and counseling about physical activity/exercise needs.;Develop an individualized exercise prescription for aerobic and resistive training based on initial evaluation findings, risk stratification, comorbidities and participant's personal goals.       Expected Outcomes Long Term: Add in home exercise to make exercise part of routine and to increase amount of physical activity.;Short Term: Attend rehab on a regular basis to increase amount of physical activity.;Long Term: Exercising regularly at least 3-5 days a week.       Increase Strength and Stamina Yes       Intervention Provide advice, education, support and counseling about physical activity/exercise needs.;Develop an individualized  exercise prescription for aerobic and resistive training based on initial evaluation findings, risk stratification, comorbidities and participant's personal goals.       Expected Outcomes Short Term: Increase workloads from initial exercise prescription for resistance, speed, and METs.;Short Term: Perform resistance training exercises routinely during rehab and add in resistance training at home;Long Term: Improve cardiorespiratory fitness, muscular endurance and strength as measured by increased METs and functional capacity (6MWT)       Able to understand and use rate of perceived exertion (RPE) scale Yes       Intervention Provide education and explanation on how to use RPE scale       Expected  Outcomes Short Term: Able to use RPE daily in rehab to express subjective intensity level;Long Term:  Able to use RPE to guide intensity level when exercising independently       Able to understand and use Dyspnea scale Yes       Intervention Provide education and explanation on how to use Dyspnea scale       Expected Outcomes Short Term: Able to use Dyspnea scale daily in rehab to express subjective sense of shortness of breath during exertion;Long Term: Able to use Dyspnea scale to guide intensity level when exercising independently       Knowledge and understanding of Target Heart Rate Range (THRR) Yes       Intervention Provide education and explanation of THRR including how the numbers were predicted and where they are located for reference       Expected Outcomes Short Term: Able to state/look up THRR;Short Term: Able to use daily as guideline for intensity in rehab;Long Term: Able to use THRR to govern intensity when exercising independently       Able to check pulse independently Yes       Intervention Provide education and demonstration on how to check pulse in carotid and radial arteries.;Review the importance of being able to check your own pulse for safety during independent exercise       Expected Outcomes Short Term: Able to explain why pulse checking is important during independent exercise;Long Term: Able to check pulse independently and accurately       Understanding of Exercise Prescription Yes       Intervention Provide education, explanation, and written materials on patient's individual exercise prescription       Expected Outcomes Short Term: Able to explain program exercise prescription;Long Term: Able to explain home exercise prescription to exercise independently              Copy of goals given to participant.

## 2020-11-05 NOTE — Progress Notes (Addendum)
Pulmonary Individual Treatment Plan  Patient Details  Name: Dorothy Rogers MRN: [id number] Date of Birth: 12/30/1950 Referring Provider:   Flowsheet Row Pulmonary Rehab from 11/05/2020 in [facility] Cardiac and Pulmonary Rehab  Referring Provider Zetta Bills MD      Initial Encounter Date:  Flowsheet Row Pulmonary Rehab from 11/05/2020 in [facility] Cardiac and Pulmonary Rehab  Date 11/05/20      Visit Diagnosis: Pulmonary hypertension ([place])  Patient's Home Medications on Admission:  Current Outpatient Medications:  .  acetaminophen (TYLENOL) 500 MG tablet, Take 1,000 mg by mouth every 6 (six) hours as needed for mild pain or moderate pain. , Disp: , Rfl:  .  acyclovir (ZOVIRAX) 400 MG tablet, Take 400 mg by mouth daily as needed (for fever blisters). , Disp: , Rfl:  .  aspirin EC 81 MG EC tablet, Take 1 tablet (81 mg total) by mouth daily. (Patient taking differently: Take 81 mg by mouth at bedtime.), Disp: , Rfl:  .  atorvastatin (LIPITOR) 40 MG tablet, Take 1 tablet by mouth once daily, Disp: , Rfl:  .  Biotin 10 MG TABS, Take by mouth., Disp: , Rfl:  .  Calcium Carb-Cholecalciferol (CALTRATE 600+D3 PO), Take 1 tablet by mouth daily., Disp: , Rfl:  .  Calcium Carbonate-Vitamin D 600-400 MG-UNIT tablet, Take by mouth. (Patient not taking: Reported on 10/29/2020), Disp: , Rfl:  .  CARTIA XT 120 MG 24 hr capsule, Take 120 mg by mouth daily., Disp: , Rfl: 3 .  Cholecalciferol (VITAMIN D-3) 125 MCG (5000 UT) TABS, Take 5,000 Units by mouth daily., Disp: , Rfl:  .  clopidogrel (PLAVIX) 75 MG tablet, Take 1 tablet by mouth once daily, Disp: , Rfl:  .  conjugated estrogens (PREMARIN) vaginal cream, Place 0.5 g vaginally 2 (two) times a week. (Patient not taking: Reported on 10/29/2020), Disp: , Rfl:  .  Cyanocobalamin (VITAMIN B-12) 5000 MCG SUBL, Place 5,000 mcg under the tongue daily., Disp: , Rfl:  .  diazepam (VALIUM) 2 MG tablet, Take by mouth., Disp: , Rfl:  .  ferrous sulfate 325 (65 FE) MG  tablet, Take 325 mg by mouth daily with breakfast., Disp: , Rfl:  .  gabapentin (NEURONTIN) 100 MG capsule, Take by mouth., Disp: , Rfl:  .  glipiZIDE (GLUCOTROL) 10 MG tablet, Take 10 mg by mouth 2 (two) times daily. Before breakfast & before supper, Disp: , Rfl:  .  ibuprofen (ADVIL) 200 MG tablet, Take 400 mg by mouth every 8 (eight) hours as needed (for pain.)., Disp: , Rfl:  .  insulin glargine (LANTUS SOLOSTAR) 100 UNIT/ML Solostar Pen, INJECT 36 TO 50 UNITS SUBCUTANEOUSLY ONCE DAILY AS DIRECTED, Disp: , Rfl:  .  insulin lispro (HUMALOG) 100 UNIT/ML injection, Inject into the skin 3 (three) times daily before meals. Sliding scale, Disp: , Rfl:  .  loperamide (IMODIUM) 2 MG capsule, Take by mouth., Disp: , Rfl:  .  losartan (COZAAR) 100 MG tablet, Take 1 tablet by mouth once daily, Disp: , Rfl:  .  melatonin 1 MG TABS tablet, Take by mouth. (Patient not taking: Reported on 10/29/2020), Disp: , Rfl:  .  ondansetron (ZOFRAN-ODT) 8 MG disintegrating tablet, Take 8 mg by mouth as needed for nausea or vomiting., Disp: , Rfl:  .  pantoprazole (PROTONIX) 20 MG tablet, Take by mouth., Disp: , Rfl:  .  Probiotic CAPS, Take 1 capsule by mouth daily., Disp: , Rfl:  .  promethazine (PHENERGAN) 12.5 MG tablet, Take 1 tablet (12.5  mg total) by mouth every 6 (six) hours as needed for nausea or vomiting., Disp: 15 tablet, Rfl: 0 No current facility-administered medications for this visit.  Facility-Administered Medications Ordered in Other Visits:  .  sodium chloride flush (NS) 0.9 % injection 3 mL, 3 mL, Intravenous, Q12H, Fath, Javier Docker, MD  Past Medical History: Past Medical History:  Diagnosis Date  . Anemia   . Anemia   . Arthritis    lower back, hips  . Blood transfusion without reported diagnosis   . CAD (coronary artery disease) FEB AND NOV 2009   6 STENTS  . 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 Cataract   . Chronic kidney disease    Stage III  . Coronary artery  disease   . DDD (degenerative disc disease), lumbar   . Diabetes mellitus without complication ([place])    TYPE 2  . Herpes simplex   . Hyperlipidemia   . Hypertension    CONTROLLED ON MEDS  . Vertigo    occasional, no episodes in 2-3 months  . Vertigo    OCCASIONALLY  . Vitamin B 12 deficiency     Tobacco Use: Social History   Tobacco Use  Smoking Status Never Smoker  Smokeless Tobacco Never Used    Labs: Recent Review Flowsheet Data    Labs for ITP Cardiac and Pulmonary Rehab Latest Ref Rng & Units 06/09/2015 12/01/2015   Hemoglobin A1c 4.0 - 6.0 % 6.5(H) -   PHART 7.350 - 7.450 - 7.67(HH)   PCO2ART 32.0 - 48.0 mmHg - 17(LL)       Pulmonary Assessment Scores:  Pulmonary Assessment Scores    Row Name 10/29/20 1609 11/05/20 1517       ADL UCSD   ADL Phase Entry Entry    SOB Score total 88 88    Rest 2 2    Walk 4 4    Stairs 4 4    Bath 5 5    Dress 4 4    Shop 4 4         CAT Score   CAT Score 15 15         mMRC Score   mMRC Score -- 4           UCSD: Self-administered rating of dyspnea associated with activities of daily living (ADLs) 6-point scale (0 = "not at all" to 5 = "maximal or unable to do because of breathlessness")  Scoring Scores range from 0 to 120.  Minimally important difference is 5 units  CAT: CAT can identify the health impairment of COPD patients and is better correlated with disease progression.  CAT has a scoring range of zero to 40. The CAT score is classified into four groups of low (less than 10), medium (10 - 20), high (21-30) and very high (31-40) based on the impact level of disease on health status. A CAT score over 10 suggests significant symptoms.  A worsening CAT score could be explained by an exacerbation, poor medication adherence, poor inhaler technique, or progression of COPD or comorbid conditions.  CAT MCID is 2 points  mMRC: mMRC (Modified Medical Research Council) Dyspnea Scale is used to assess the degree of  baseline functional disability in patients of respiratory disease due to dyspnea. No minimal important difference is established. A decrease in score of 1 point or greater is considered a positive change.   Pulmonary Function Assessment:  Pulmonary Function Assessment - 11/05/20 1516  Breath   Shortness of Breath Yes;Panic with Shortness of Breath;Fear of Shortness of Breath;Limiting activity           Exercise Target Goals: Exercise Program Goal: Individual exercise prescription set using results from initial 6 min walk test and THRR while considering  patient's activity barriers and safety.   Exercise Prescription Goal: Initial exercise prescription builds to 30-45 minutes a day of aerobic activity, 2-3 days per week.  Home exercise guidelines will be given to patient during program as part of exercise prescription that the participant will acknowledge.  Education: Aerobic Exercise: - Group verbal and visual presentation on the components of exercise prescription. Introduces F.I.T.T principle from ACSM for exercise prescriptions.  Reviews F.I.T.T. principles of aerobic exercise including progression. Written material given at graduation.   Education: Resistance Exercise: - Group verbal and visual presentation on the components of exercise prescription. Introduces F.I.T.T principle from ACSM for exercise prescriptions  Reviews F.I.T.T. principles of resistance exercise including progression. Written material given at graduation.    Education: Exercise & Equipment Safety: - Individual verbal instruction and demonstration of equipment use and safety with use of the equipment. Flowsheet Row Pulmonary Rehab from 11/05/2020 in [facility] Cardiac and Pulmonary Rehab  Date 11/05/20  Educator [facility]  Instruction Review Code 1- Verbalizes Understanding      Education: Exercise Physiology & General Exercise Guidelines: - Group verbal and written instruction with models to review the exercise  physiology of the cardiovascular system and associated critical values. Provides general exercise guidelines with specific guidelines to those with heart or lung disease.    Education: Flexibility, Balance, Mind/Body Relaxation: - Group verbal and visual presentation with interactive activity on the components of exercise prescription. Introduces F.I.T.T principle from ACSM for exercise prescriptions. Reviews F.I.T.T. principles of flexibility and balance exercise training including progression. Also discusses the mind body connection.  Reviews various relaxation techniques to help reduce and manage stress (i.e. Deep breathing, progressive muscle relaxation, and visualization). Balance handout provided to take home. Written material given at graduation.   Activity Barriers & Risk Stratification:  Activity Barriers & Cardiac Risk Stratification - 11/05/20 1510      Activity Barriers & Cardiac Risk Stratification   Activity Barriers Arthritis;Back Problems;Neck/Spine Problems;Muscular Weakness;Deconditioning;Balance Concerns;Shortness of Breath;Assistive Device;Joint Problems           6 Minute Walk:  6 Minute Walk    Row Name 11/05/20 1508         6 Minute Walk   Phase Initial     Distance 296 feet     Walk Time 4.88 minutes     # of Rest Breaks 5  10 sec, 17 sec, 12 sec, 10 sec, stopped at 5:32     MPH 0.67     METS 1.24     RPE 15     Perceived Dyspnea  3     VO2 Peak 4.35     Symptoms Yes (comment)     Comments SOB, chronic hip pain 3/10     Resting HR 95 bpm     Resting BP 132/64     Resting Oxygen Saturation  98 %     Exercise Oxygen Saturation  during 6 min walk 98 %     Max Ex. HR 128 bpm     Max Ex. BP 146/72     2 Minute Post BP 130/64           Interval HR   1 Minute HR 127  2 Minute HR 128     3 Minute HR 127     4 Minute HR 123     5 Minute HR 124     6 Minute HR 113     2 Minute Post HR 106     Interval Heart Rate? Yes           Interval Oxygen    Interval Oxygen? Yes     Baseline Oxygen Saturation % 98 %     1 Minute Oxygen Saturation % 98 %     1 Minute Liters of Oxygen 0 L  Room Air     2 Minute Oxygen Saturation % 99 %     2 Minute Liters of Oxygen 0 L     3 Minute Oxygen Saturation % 99 %     3 Minute Liters of Oxygen 0 L     4 Minute Oxygen Saturation % 98 %     4 Minute Liters of Oxygen 0 L     5 Minute Oxygen Saturation % 98 %     5 Minute Liters of Oxygen 0 L     6 Minute Oxygen Saturation % 98 %     6 Minute Liters of Oxygen 0 L     2 Minute Post Oxygen Saturation % 98 %     2 Minute Post Liters of Oxygen 0 L           Oxygen Initial Assessment:  Oxygen Initial Assessment - 11/05/20 1516      Home Oxygen   Home Oxygen Device None    Sleep Oxygen Prescription None    Home Exercise Oxygen Prescription None    Home Resting Oxygen Prescription None    Compliance with Home Oxygen Use Yes      Initial 6 min Walk   Oxygen Used None      Program Oxygen Prescription   Program Oxygen Prescription None      Intervention   Short Term Goals To learn and understand importance of monitoring SPO2 with pulse oximeter and demonstrate accurate use of the pulse oximeter.;To learn and understand importance of maintaining oxygen saturations>88%;To learn and demonstrate proper pursed lip breathing techniques or other breathing techniques.;To learn and demonstrate proper use of respiratory medications    Long  Term Goals Verbalizes importance of monitoring SPO2 with pulse oximeter and return demonstration;Maintenance of O2 saturations>88%;Exhibits proper breathing techniques, such as pursed lip breathing or other method taught during program session;Compliance with respiratory medication;Demonstrates proper use of MDI's           Oxygen Re-Evaluation:   Oxygen Discharge (Final Oxygen Re-Evaluation):   Initial Exercise Prescription:  Initial Exercise Prescription - 11/05/20 1500      Date of Initial Exercise RX and  Referring Provider   Date 11/05/20    Referring Provider Zetta Bills MD      Recumbant Bike   Level 1    RPM 50    Watts 5    Minutes 15    METs 2      NuStep   Level 1    SPM 80    Minutes 15    METs 2      REL-XR   Level 1    Speed 50    Minutes 15    METs 2      Track   Laps 8    Minutes 15    METs 1.4      Prescription Details  Frequency (times per week) 3    Duration Progress to 30 minutes of continuous aerobic without signs/symptoms of physical distress      Intensity   THRR 40-80% of Max Heartrate 117-139    Ratings of Perceived Exertion 11-13    Perceived Dyspnea 0-4      Progression   Progression Continue to progress workloads to maintain intensity without signs/symptoms of physical distress.      Resistance Training   Training Prescription Yes    Weight 3 lb    Reps 10-15           Perform Capillary Blood Glucose checks as needed.  Exercise Prescription Changes:   Exercise Prescription Changes    Row Name 11/05/20 1500             Response to Exercise   Blood Pressure (Admit) 132/64       Blood Pressure (Exercise) 146/72       Blood Pressure (Exit) 104/62       Heart Rate (Admit) 95 bpm       Heart Rate (Exercise) 128 bpm       Heart Rate (Exit) 94 bpm       Oxygen Saturation (Admit) 98 %       Oxygen Saturation (Exercise) 98 %       Oxygen Saturation (Exit) 98 %       Rating of Perceived Exertion (Exercise) 15       Perceived Dyspnea (Exercise) 3       Symptoms SOB, hip pain (3/10)       Comments walk test results              Exercise Comments:   Exercise Goals and Review:   Exercise Goals    Row Name 11/05/20 1512             Exercise Goals   Increase Physical Activity Yes       Intervention Provide advice, education, support and counseling about physical activity/exercise needs.;Develop an individualized exercise prescription for aerobic and resistive training based on initial evaluation findings, risk  stratification, comorbidities and participant's personal goals.       Expected Outcomes Long Term: Add in home exercise to make exercise part of routine and to increase amount of physical activity.;Short Term: Attend rehab on a regular basis to increase amount of physical activity.;Long Term: Exercising regularly at least 3-5 days a week.       Increase Strength and Stamina Yes       Intervention Provide advice, education, support and counseling about physical activity/exercise needs.;Develop an individualized exercise prescription for aerobic and resistive training based on initial evaluation findings, risk stratification, comorbidities and participant's personal goals.       Expected Outcomes Short Term: Increase workloads from initial exercise prescription for resistance, speed, and METs.;Short Term: Perform resistance training exercises routinely during rehab and add in resistance training at home;Long Term: Improve cardiorespiratory fitness, muscular endurance and strength as measured by increased METs and functional capacity (6MWT)       Able to understand and use rate of perceived exertion (RPE) scale Yes       Intervention Provide education and explanation on how to use RPE scale       Expected Outcomes Short Term: Able to use RPE daily in rehab to express subjective intensity level;Long Term:  Able to use RPE to guide intensity level when exercising independently       Able to understand and use Dyspnea  scale Yes       Intervention Provide education and explanation on how to use Dyspnea scale       Expected Outcomes Short Term: Able to use Dyspnea scale daily in rehab to express subjective sense of shortness of breath during exertion;Long Term: Able to use Dyspnea scale to guide intensity level when exercising independently       Knowledge and understanding of Target Heart Rate Range (THRR) Yes       Intervention Provide education and explanation of THRR including how the numbers were predicted  and where they are located for reference       Expected Outcomes Short Term: Able to state/look up THRR;Short Term: Able to use daily as guideline for intensity in rehab;Long Term: Able to use THRR to govern intensity when exercising independently       Able to check pulse independently Yes       Intervention Provide education and demonstration on how to check pulse in carotid and radial arteries.;Review the importance of being able to check your own pulse for safety during independent exercise       Expected Outcomes Short Term: Able to explain why pulse checking is important during independent exercise;Long Term: Able to check pulse independently and accurately       Understanding of Exercise Prescription Yes       Intervention Provide education, explanation, and written materials on patient's individual exercise prescription       Expected Outcomes Short Term: Able to explain program exercise prescription;Long Term: Able to explain home exercise prescription to exercise independently              Exercise Goals Re-Evaluation :   Discharge Exercise Prescription (Final Exercise Prescription Changes):  Exercise Prescription Changes - 11/05/20 1500      Response to Exercise   Blood Pressure (Admit) 132/64    Blood Pressure (Exercise) 146/72    Blood Pressure (Exit) 104/62    Heart Rate (Admit) 95 bpm    Heart Rate (Exercise) 128 bpm    Heart Rate (Exit) 94 bpm    Oxygen Saturation (Admit) 98 %    Oxygen Saturation (Exercise) 98 %    Oxygen Saturation (Exit) 98 %    Rating of Perceived Exertion (Exercise) 15    Perceived Dyspnea (Exercise) 3    Symptoms SOB, hip pain (3/10)    Comments walk test results           Nutrition:  Target Goals: Understanding of nutrition guidelines, daily intake of sodium 1500mg , cholesterol 200mg , calories 30% from fat and 7% or less from saturated fats, daily to have 5 or more servings of fruits and vegetables.  Education: All About  Nutrition: -Group instruction provided by verbal, written material, interactive activities, discussions, models, and posters to present general guidelines for heart healthy nutrition including fat, fiber, MyPlate, the role of sodium in heart healthy nutrition, utilization of the nutrition label, and utilization of this knowledge for meal planning. Follow up email sent as well. Written material given at graduation. Flowsheet Row Pulmonary Rehab from 11/05/2020 in [facility] Cardiac and Pulmonary Rehab  Education need identified 11/05/20      Biometrics:  Pre Biometrics - 11/05/20 1513      Pre Biometrics   Height 5' 2.5" (1.588 m)    Weight 178 lb 8 oz (81 kg)    BMI (Calculated) 32.11    Single Leg Stand 14.4 seconds  Nutrition Therapy Plan and Nutrition Goals:  Nutrition Therapy & Goals - 11/05/20 1513      Intervention Plan   Intervention Prescribe, educate and counsel regarding individualized specific dietary modifications aiming towards targeted core components such as weight, hypertension, lipid management, diabetes, heart failure and other comorbidities.    Expected Outcomes Short Term Goal: Understand basic principles of dietary content, such as calories, fat, sodium, cholesterol and nutrients.;Short Term Goal: A plan has been developed with personal nutrition goals set during dietitian appointment.;Long Term Goal: Adherence to prescribed nutrition plan.           Nutrition Assessments:  MEDIFICTS Score Key:  ?70 Need to make dietary changes   40-70 Heart Healthy Diet  ? 40 Therapeutic Level Cholesterol Diet  Flowsheet Row Pulmonary Rehab from 11/05/2020 in [facility] Cardiac and Pulmonary Rehab  Picture Your Plate Total Score on Admission 60     Picture Your Plate Scores:  <22 Unhealthy dietary pattern with much room for improvement.  41-50 Dietary pattern unlikely to meet recommendations for good health and room for improvement.  51-60 More healthful dietary  pattern, with some room for improvement.   >60 Healthy dietary pattern, although there may be some specific behaviors that could be improved.   Nutrition Goals Re-Evaluation:   Nutrition Goals Discharge (Final Nutrition Goals Re-Evaluation):   Psychosocial: Target Goals: Acknowledge presence or absence of significant depression and/or stress, maximize coping skills, provide positive support system. Participant is able to verbalize types and ability to use techniques and skills needed for reducing stress and depression.   Education: Stress, Anxiety, and Depression - Group verbal and visual presentation to define topics covered.  Reviews how body is impacted by stress, anxiety, and depression.  Also discusses healthy ways to reduce stress and to treat/manage anxiety and depression.  Written material given at graduation.   Education: Sleep Hygiene -Provides group verbal and written instruction about how sleep can affect your health.  Define sleep hygiene, discuss sleep cycles and impact of sleep habits. Review good sleep hygiene tips.    Initial Review & Psychosocial Screening:  Initial Psych Review & Screening - 10/29/20 1550      Initial Review   Current issues with Current Stress Concerns;Current Sleep Concerns    Source of Stress Concerns Unable to perform yard/household activities    Comments Worries about everything. [place]? Yes   husband and son     Barriers   Psychosocial barriers to participate in program There are no identifiable barriers or psychosocial needs.;The patient should benefit from training in stress management and relaxation.      Screening Interventions   Interventions Encouraged to exercise;To provide support and resources with identified psychosocial needs;Provide feedback about the scores to participant    Expected Outcomes Short Term goal: Utilizing psychosocial counselor, staff and physician to  assist with identification of specific Stressors or current issues interfering with healing process. Setting desired goal for each stressor or current issue identified.;Long Term Goal: Stressors or current issues are controlled or eliminated.;Short Term goal: Identification and review with participant of any Quality of Life or Depression concerns found by scoring the questionnaire.;Long Term goal: The participant improves quality of Life and PHQ9 Scores as seen by post scores and/or verbalization of changes           Quality of Life Scores:  Scores of 19 and below usually indicate a poorer quality of life in  these areas.  A difference of  2-3 points is a clinically meaningful difference.  A difference of 2-3 points in the total score of the Quality of Life Index has been associated with significant improvement in overall quality of life, self-image, physical symptoms, and general health in studies assessing change in quality of life.  PHQ-9: Recent Review Flowsheet Data    Depression screen [facility] 2/9 11/05/2020 01/01/2015 12/25/2014 11/27/2014 11/18/2014   Decreased Interest 3 0 0 0 0   Down, Depressed, Hopeless 1 0 0 0 0   PHQ - 2 Score 4 0 0 0 0   Altered sleeping 2 - - - -   Tired, decreased energy 3 - - - -   Change in appetite 0 - - - -   Feeling bad or failure about yourself  1 - - - -   Trouble concentrating 0 - - - -   Moving slowly or fidgety/restless 0 - - - -   Suicidal thoughts 0 - - - -   PHQ-9 Score 10 - - - -   Difficult doing work/chores Somewhat difficult - - - -     Interpretation of Total Score  Total Score Depression Severity:  1-4 = Minimal depression, 5-9 = Mild depression, 10-14 = Moderate depression, 15-19 = Moderately severe depression, 20-27 = Severe depression   Psychosocial Evaluation and Intervention:  Psychosocial Evaluation - 10/29/20 1613      Psychosocial Evaluation & Interventions   Interventions Encouraged to exercise with the program and follow exercise  prescription;Relaxation education    Comments Catharina has no barriers to attending the program. She has a great support system with her husband at home. She does have some concerns with her sleep pattern and has tried melatonin that did not help. Her stress is over the fact that she cannot participate in actitivities because of fatigue or shortness of breath stopping her. She worries and cannot always make decisions per her husband and [place]. She is looking forward to attending the program to see if she can gain some stamina and less shortness of breath with activity. She does have back and hip chronic concerns. She is being treated with injections and meds to help control the pain . She is ready to start the program.    Expected Outcomes STG Daionna is able to attend all scheduled sessions and gain some stamina to be able to do more at home with less fatigue and shortness of breath. Hoping this will reduce the stress of not participating in her daily activities.  LTG: Evaluna will be able to maintain her progress from the program    Continue Psychosocial Services  Follow up required by staff           Psychosocial Re-Evaluation:   Psychosocial Discharge (Final Psychosocial Re-Evaluation):   Education: Education Goals: Education classes will be provided on a weekly basis, covering required topics. Participant will state understanding/return demonstration of topics presented.  Learning Barriers/Preferences:   General Pulmonary Education Topics:  Infection Prevention: - Provides verbal and written material to individual with discussion of infection control including proper hand washing and proper equipment cleaning during exercise session. Flowsheet Row Pulmonary Rehab from 11/05/2020 in [facility] Cardiac and Pulmonary Rehab  Date 11/05/20  Educator [facility]  Instruction Review Code 1- Verbalizes Understanding      Falls Prevention: - Provides verbal and written material to individual with discussion  of falls prevention and safety. Flowsheet Row Pulmonary Rehab from 11/05/2020 in [facility] Cardiac and  Pulmonary Rehab  Date 10/29/20  Educator SB  Instruction Review Code 1- Verbalizes Understanding      Chronic Lung Disease Review: - Group verbal instruction with posters, models, PowerPoint presentations and videos,  to review new updates, new respiratory medications, new advancements in procedures and treatments. Providing information on websites and "800" numbers for continued self-education. Includes information about supplement oxygen, available portable oxygen systems, continuous and intermittent flow rates, oxygen safety, concentrators, and Medicare reimbursement for oxygen. Explanation of Pulmonary Drugs, including class, frequency, complications, importance of spacers, rinsing mouth after steroid MDI's, and proper cleaning methods for nebulizers. Review of basic lung anatomy and physiology related to function, structure, and complications of lung disease. Review of risk factors. Discussion about methods for diagnosing sleep apnea and types of masks and machines for OSA. Includes a review of the use of types of environmental controls: home humidity, furnaces, filters, dust mite/pet prevention, HEPA vacuums. Discussion about weather changes, air quality and the benefits of nasal washing. Instruction on Warning signs, infection symptoms, calling MD promptly, preventive modes, and value of vaccinations. Review of effective airway clearance, coughing and/or vibration techniques. Emphasizing that all should Create an Action Plan. Written material given at graduation. Flowsheet Row Pulmonary Rehab from 11/05/2020 in [facility] Cardiac and Pulmonary Rehab  Education need identified 11/05/20      AED/CPR: - Group verbal and written instruction with the use of models to demonstrate the basic use of the AED with the basic ABC's of resuscitation.    Anatomy and Cardiac Procedures: - Group verbal and visual  presentation and models provide information about basic cardiac anatomy and function. Reviews the testing methods done to diagnose heart disease and the outcomes of the test results. Describes the treatment choices: Medical Management, Angioplasty, or Coronary Bypass Surgery for treating various heart conditions including Myocardial Infarction, Angina, Valve Disease, and Cardiac Arrhythmias.  Written material given at graduation.   Medication Safety: - Group verbal and visual instruction to review commonly prescribed medications for heart and lung disease. Reviews the medication, class of the drug, and side effects. Includes the steps to properly store meds and maintain the prescription regimen.  Written material given at graduation.   Other: -Provides group and verbal instruction on various topics (see comments)   Knowledge Questionnaire Score:  Knowledge Questionnaire Score - 11/05/20 1514      Knowledge Questionnaire Score   Pre Score 15/18  oxygen and nutrition            Core Components/Risk Factors/Patient Goals at Admission:  Personal Goals and Risk Factors at Admission - 11/05/20 1514      Core Components/Risk Factors/Patient Goals on Admission    Weight Management Yes;Obesity;Weight Loss    Intervention Weight Management: Develop a combined nutrition and exercise program designed to reach desired caloric intake, while maintaining appropriate intake of nutrient and fiber, sodium and fats, and appropriate energy expenditure required for the weight goal.;Weight Management/Obesity: Establish reasonable short term and long term weight goals.;Weight Management: Provide education and appropriate resources to help participant work on and attain dietary goals.;Obesity: Provide education and appropriate resources to help participant work on and attain dietary goals.    Admit Weight 178 lb 8 oz (81 kg)    Goal Weight: Short Term 170 lb (77.1 kg)    Goal Weight: Long Term 165 lb (74.8 kg)     Expected Outcomes Short Term: Continue to assess and modify interventions until short term weight is achieved;Long Term: Adherence to nutrition and physical activity/exercise  program aimed toward attainment of established weight goal;Weight Loss: Understanding of general recommendations for a balanced deficit meal plan, which promotes 1-2 lb weight loss per week and includes a negative energy balance of [phone] kcal/d;Understanding recommendations for meals to include 15-35% energy as protein, 25-35% energy from fat, 35-60% energy from carbohydrates, less than 200mg  of dietary cholesterol, 20-35 gm of total fiber daily;Understanding of distribution of calorie intake throughout the day with the consumption of 4-5 meals/snacks    Improve shortness of breath with ADL's Yes    Intervention Provide education, individualized exercise plan and daily activity instruction to help decrease symptoms of SOB with activities of daily living.    Expected Outcomes Short Term: Improve cardiorespiratory fitness to achieve a reduction of symptoms when performing ADLs;Long Term: Be able to perform more ADLs without symptoms or delay the onset of symptoms    Diabetes Yes    Intervention Provide education about signs/symptoms and action to take for hypo/hyperglycemia.;Provide education about proper nutrition, including hydration, and aerobic/resistive exercise prescription along with prescribed medications to achieve blood glucose in normal ranges: Fasting glucose 65-99 mg/dL    Expected Outcomes Short Term: Participant verbalizes understanding of the signs/symptoms and immediate care of hyper/hypoglycemia, proper foot care and importance of medication, aerobic/resistive exercise and nutrition plan for blood glucose control.;Long Term: Attainment of HbA1C < 7%.    Hypertension Yes    Intervention Provide education on lifestyle modifcations including regular physical activity/exercise, weight management, moderate sodium  restriction and increased consumption of fresh fruit, vegetables, and low fat dairy, alcohol moderation, and smoking cessation.;Monitor prescription use compliance.    Expected Outcomes Short Term: Continued assessment and intervention until BP is < 140/62mm HG in hypertensive participants. < 130/3mm HG in hypertensive participants with diabetes, heart failure or chronic kidney disease.;Long Term: Maintenance of blood pressure at goal levels.    Lipids Yes    Intervention Provide education and support for participant on nutrition & aerobic/resistive exercise along with prescribed medications to achieve LDL 70mg , HDL >40mg .    Expected Outcomes Short Term: Participant states understanding of desired cholesterol values and is compliant with medications prescribed. Participant is following exercise prescription and nutrition guidelines.;Long Term: Cholesterol controlled with medications as prescribed, with individualized exercise RX and with personalized nutrition plan. Value goals: LDL < 70mg , HDL > 40 mg.           Education:Diabetes - Individual verbal and written instruction to review signs/symptoms of diabetes, desired ranges of glucose level fasting, after meals and with exercise. Acknowledge that pre and post exercise glucose checks will be done for 3 sessions at entry of program. Flowsheet Row Pulmonary Rehab from 11/05/2020 in [facility] Cardiac and Pulmonary Rehab  Date 11/05/20  Educator [facility]  Instruction Review Code 1- Verbalizes Understanding      Know Your Numbers and Heart Failure: - Group verbal and visual instruction to discuss disease risk factors for cardiac and pulmonary disease and treatment options.  Reviews associated critical values for Overweight/Obesity, Hypertension, Cholesterol, and Diabetes.  Discusses basics of heart failure: signs/symptoms and treatments.  Introduces Heart Failure Zone chart for action plan for heart failure.  Written material given at graduation.   Core  Components/Risk Factors/Patient Goals Review:    Core Components/Risk Factors/Patient Goals at Discharge (Final Review):    ITP Comments:  ITP Comments    Row Name 10/29/20 1620 11/05/20 1507         ITP Comments Virtual orientation call completed today. shehas an appointment on Date: 11/05/2020  for EP eval and gym Orientation.  Documentation of diagnosis can be found in [facility] Date: 10/01/2020. Completed 6MWT and gym orientation. Initial ITP created and sent for review to Dr. Zetta Bills, Medical Director.             Comments: Initial ITP

## 2020-11-10 ENCOUNTER — Encounter: Payer: Medicare Other | Admitting: *Deleted

## 2020-11-10 ENCOUNTER — Other Ambulatory Visit: Payer: Self-pay

## 2020-11-10 DIAGNOSIS — I272 Pulmonary hypertension, unspecified: Secondary | ICD-10-CM

## 2020-11-10 LAB — GLUCOSE, CAPILLARY
Glucose-Capillary: 133 mg/dL — ABNORMAL HIGH (ref 70–99)
Glucose-Capillary: 102 mg/dL — ABNORMAL HIGH (ref 70–99)

## 2020-11-10 NOTE — Progress Notes (Signed)
Daily Session Note  Patient Details  Name: Dorothy Rogers MRN: [id number] Date of Birth: 1950/11/09 Referring Provider:   Flowsheet Row Pulmonary Rehab from 11/05/2020 in [facility] Cardiac and Pulmonary Rehab  Referring Provider Zetta Bills MD       Encounter Date: 11/10/2020  Check In:  Session Check In - 11/10/20 1427       Check-In   Supervising physician immediately available to respond to emergencies See telemetry face sheet for immediately available ER MD    Location [facility]-Cardiac & Pulmonary Rehab    Staff Present Heath Lark, RN, BSN, CCRP;Kelly Bollinger, MPA, Nino Glow, MS, ASCM CEP, Exercise Physiologist    Virtual Visit No    Medication changes reported     No    Fall or balance concerns reported    No    Warm-up and Cool-down Performed on first and last piece of equipment    Resistance Training Performed Yes    VAD Patient? No    PAD/SET Patient? No      Pain Assessment   Currently in Pain? No/denies                Social History   Tobacco Use  Smoking Status Never  Smokeless Tobacco Never    Goals Met:  Exercise tolerated well Personal goals reviewed No report of cardiac concerns or symptoms  Goals Unmet:  Not Applicable  Comments: First full day of exercise!  Patient was oriented to gym and equipment including functions, settings, policies, and procedures.  Patient's individual exercise prescription and treatment plan were reviewed.  All starting workloads were established based on the results of the 6 minute walk test done at initial orientation visit.  The plan for exercise progression was also introduced and progression will be customized based on patient's performance and goals.    Dr. Emily Filbert is Medical Director for [place].  Dr. Ottie Glazier is Medical Director for [facility] Pulmonary Rehabilitation.

## 2020-11-12 ENCOUNTER — Encounter: Payer: Self-pay | Admitting: *Deleted

## 2020-11-12 ENCOUNTER — Other Ambulatory Visit: Payer: Self-pay

## 2020-11-12 DIAGNOSIS — I272 Pulmonary hypertension, unspecified: Secondary | ICD-10-CM

## 2020-11-12 LAB — GLUCOSE, CAPILLARY
Glucose-Capillary: 121 mg/dL — ABNORMAL HIGH (ref 70–99)
Glucose-Capillary: 113 mg/dL — ABNORMAL HIGH (ref 70–99)

## 2020-11-12 NOTE — Progress Notes (Signed)
Pulmonary Individual Treatment Plan  Patient Details  Name: Dorothy Rogers MRN: [id number] Date of Birth: [birth year] Referring Provider:   Flowsheet Row Pulmonary Rehab from 11/05/2020 in [facility] Cardiac and Pulmonary Rehab  Referring Provider Zetta Bills MD       Initial Encounter Date:  Flowsheet Row Pulmonary Rehab from 11/05/2020 in [facility] Cardiac and Pulmonary Rehab  Date 11/05/20       Visit Diagnosis: Pulmonary hypertension ([place])  Patient's Home Medications on Admission:  Current Outpatient Medications:    acetaminophen (TYLENOL) 500 MG tablet, Take 1,000 mg by mouth every 6 (six) hours as needed for mild pain or moderate pain. , Disp: , Rfl:    acyclovir (ZOVIRAX) 400 MG tablet, Take 400 mg by mouth daily as needed (for fever blisters). , Disp: , Rfl:    aspirin EC 81 MG EC tablet, Take 1 tablet (81 mg total) by mouth daily. (Patient taking differently: Take 81 mg by mouth at bedtime.), Disp: , Rfl:    atorvastatin (LIPITOR) 40 MG tablet, Take 1 tablet by mouth once daily, Disp: , Rfl:    Biotin 10 MG TABS, Take by mouth., Disp: , Rfl:    Calcium Carb-Cholecalciferol (CALTRATE 600+D3 PO), Take 1 tablet by mouth daily., Disp: , Rfl:    Calcium Carbonate-Vitamin D 600-400 MG-UNIT tablet, Take by mouth. (Patient not taking: Reported on 10/29/2020), Disp: , Rfl:    CARTIA XT 120 MG 24 hr capsule, Take 120 mg by mouth daily., Disp: , Rfl: 3   Cholecalciferol (VITAMIN D-3) 125 MCG (5000 UT) TABS, Take 5,000 Units by mouth daily., Disp: , Rfl:    clopidogrel (PLAVIX) 75 MG tablet, Take 1 tablet by mouth once daily, Disp: , Rfl:    conjugated estrogens (PREMARIN) vaginal cream, Place 0.5 g vaginally 2 (two) times a week. (Patient not taking: Reported on 10/29/2020), Disp: , Rfl:    Cyanocobalamin (VITAMIN B-12) 5000 MCG SUBL, Place 5,000 mcg under the tongue daily., Disp: , Rfl:    diazepam (VALIUM) 2 MG tablet, Take by mouth., Disp: , Rfl:    ferrous sulfate 325 (65 FE) MG tablet, Take  325 mg by mouth daily with breakfast., Disp: , Rfl:    gabapentin (NEURONTIN) 100 MG capsule, Take by mouth., Disp: , Rfl:    glipiZIDE (GLUCOTROL) 10 MG tablet, Take 10 mg by mouth 2 (two) times daily. Before breakfast & before supper, Disp: , Rfl:    ibuprofen (ADVIL) 200 MG tablet, Take 400 mg by mouth every 8 (eight) hours as needed (for pain.)., Disp: , Rfl:    insulin glargine (LANTUS SOLOSTAR) 100 UNIT/ML Solostar Pen, INJECT 36 TO 50 UNITS SUBCUTANEOUSLY ONCE DAILY AS DIRECTED, Disp: , Rfl:    insulin lispro (HUMALOG) 100 UNIT/ML injection, Inject into the skin 3 (three) times daily before meals. Sliding scale, Disp: , Rfl:    loperamide (IMODIUM) 2 MG capsule, Take by mouth., Disp: , Rfl:    losartan (COZAAR) 100 MG tablet, Take 1 tablet by mouth once daily, Disp: , Rfl:    melatonin 1 MG TABS tablet, Take by mouth. (Patient not taking: Reported on 10/29/2020), Disp: , Rfl:    ondansetron (ZOFRAN-ODT) 8 MG disintegrating tablet, Take 8 mg by mouth as needed for nausea or vomiting., Disp: , Rfl:    pantoprazole (PROTONIX) 20 MG tablet, Take by mouth., Disp: , Rfl:    Probiotic CAPS, Take 1 capsule by mouth daily., Disp: , Rfl:    promethazine (PHENERGAN) 12.5 MG tablet, Take 1  tablet (12.5 mg total) by mouth every 6 (six) hours as needed for nausea or vomiting., Disp: 15 tablet, Rfl: 0 No current facility-administered medications for this visit.  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   Cataract    Chronic kidney disease    Stage III   Coronary artery disease    DDD (degenerative disc  disease), lumbar    Diabetes mellitus without complication ([place])    TYPE 2   Herpes simplex    Hyperlipidemia    Hypertension    CONTROLLED ON MEDS   Vertigo    occasional, no episodes in 2-3 months   Vertigo    OCCASIONALLY   Vitamin B 12 deficiency     Tobacco Use: Social History   Tobacco Use  Smoking Status Never  Smokeless Tobacco Never    Labs: Recent Review Flowsheet Data     Labs for ITP Cardiac and Pulmonary Rehab Latest Ref Rng & Units 06/09/2015 12/01/2015   Hemoglobin A1c 4.0 - 6.0 % 6.5(H) -   PHART 7.350 - 7.450 - 7.67(HH)   PCO2ART 32.0 - 48.0 mmHg - 17(LL)        Pulmonary Assessment Scores:  Pulmonary Assessment Scores     Row Name 10/29/20 1609 11/05/20 1517       ADL UCSD   ADL Phase Entry Entry    SOB Score total 88 88    Rest 2 2    Walk 4 4    Stairs 4 4    Bath 5 5    Dress 4 4    Shop 4 4         CAT Score      CAT Score 15 15         mMRC Score      mMRC Score -- 4            UCSD: Self-administered rating of dyspnea associated with activities of daily living (ADLs) 6-point scale (0 = "not at all" to 5 = "maximal or unable to do because of breathlessness")  Scoring Scores range from 0 to 120.  Minimally important difference is 5 units  CAT: CAT can identify the health impairment of COPD patients and is better correlated with disease progression.  CAT has a scoring range of zero to 40. The CAT score is classified into four groups of low (less than 10), medium (10 - 20), high (21-30) and very high (31-40) based on the impact level of disease on health status. A CAT score over 10 suggests significant symptoms.  A worsening CAT score could be explained by an exacerbation, poor medication adherence, poor inhaler technique, or progression of COPD or comorbid conditions.  CAT MCID is 2 points  mMRC: mMRC (Modified Medical Research Council) Dyspnea Scale is used to assess the degree of baseline functional disability in patients of  respiratory disease due to dyspnea. No minimal important difference is established. A decrease in score of 1 point or greater is considered a positive change.   Pulmonary Function Assessment:  Pulmonary Function Assessment - 11/05/20 1516       Breath   Shortness of Breath Yes;Panic with Shortness of Breath;Fear of Shortness of Breath;Limiting activity             Exercise Target Goals: Exercise Program Goal: Individual exercise prescription set using results from initial 6 min walk test and THRR while considering  patient's activity barriers and safety.   Exercise Prescription Goal: Initial exercise prescription builds to 30-45 minutes a day of aerobic activity, 2-3 days per week.  Home exercise guidelines will be given to patient during program as part of exercise prescription that the participant will acknowledge.  Education: Aerobic Exercise: - Group verbal and visual presentation on the components of exercise prescription. Introduces F.I.T.T principle from ACSM for exercise prescriptions.  Reviews F.I.T.T. principles of aerobic exercise including progression. Written material given at graduation.   Education: Resistance Exercise: - Group verbal and visual presentation on the components of exercise prescription. Introduces F.I.T.T principle from ACSM for exercise prescriptions  Reviews F.I.T.T. principles of resistance exercise including progression. Written material given at graduation.    Education: Exercise & Equipment Safety: - Individual verbal instruction and demonstration of equipment use and safety with use of the equipment. Flowsheet Row Pulmonary Rehab from 11/05/2020 in [facility] Cardiac and Pulmonary Rehab  Date 11/05/20  Educator [facility]  Instruction Review Code 1- Verbalizes Understanding       Education: Exercise Physiology & General Exercise Guidelines: - Group verbal and written instruction with models to review the exercise physiology of the cardiovascular system  and associated critical values. Provides general exercise guidelines with specific guidelines to those with heart or lung disease.    Education: Flexibility, Balance, Mind/Body Relaxation: - Group verbal and visual presentation with interactive activity on the components of exercise prescription. Introduces F.I.T.T principle from ACSM for exercise prescriptions. Reviews F.I.T.T. principles of flexibility and balance exercise training including progression. Also discusses the mind body connection.  Reviews various relaxation techniques to help reduce and manage stress (i.e. Deep breathing, progressive muscle relaxation, and visualization). Balance handout provided to take home. Written material given at graduation.   Activity Barriers & Risk Stratification:  Activity Barriers & Cardiac Risk Stratification - 11/05/20 1510       Activity Barriers & Cardiac Risk Stratification   Activity Barriers Arthritis;Back Problems;Neck/Spine Problems;Muscular Weakness;Deconditioning;Balance Concerns;Shortness of Breath;Assistive Device;Joint Problems             6 Minute Walk:  6 Minute Walk     Row Name 11/05/20 1508         6 Minute Walk   Phase Initial     Distance 296 feet     Walk Time 4.88 minutes     # of Rest Breaks 5  10 sec, 17 sec, 12 sec, 10 sec, stopped at 5:32     MPH 0.67     METS 1.24     RPE 15     Perceived Dyspnea  3     VO2 Peak 4.35     Symptoms Yes (comment)     Comments SOB, chronic hip pain 3/10     Resting HR 95 bpm     Resting BP 132/64     Resting Oxygen Saturation  98 %     Exercise Oxygen Saturation  during 6 min walk 98 %     Max Ex. HR 128 bpm     Max Ex. BP 146/72     2 Minute Post BP 130/64  Interval HR     1 Minute HR 127     2 Minute HR 128     3 Minute HR 127     4 Minute HR 123     5 Minute HR 124     6 Minute HR 113     2 Minute Post HR 106     Interval Heart Rate? Yes           Interval Oxygen     Interval Oxygen? Yes      Baseline Oxygen Saturation % 98 %     1 Minute Oxygen Saturation % 98 %     1 Minute Liters of Oxygen 0 L  Room Air     2 Minute Oxygen Saturation % 99 %     2 Minute Liters of Oxygen 0 L     3 Minute Oxygen Saturation % 99 %     3 Minute Liters of Oxygen 0 L     4 Minute Oxygen Saturation % 98 %     4 Minute Liters of Oxygen 0 L     5 Minute Oxygen Saturation % 98 %     5 Minute Liters of Oxygen 0 L     6 Minute Oxygen Saturation % 98 %     6 Minute Liters of Oxygen 0 L     2 Minute Post Oxygen Saturation % 98 %     2 Minute Post Liters of Oxygen 0 L            Oxygen Initial Assessment:  Oxygen Initial Assessment - 11/05/20 1516       Home Oxygen   Home Oxygen Device None    Sleep Oxygen Prescription None    Home Exercise Oxygen Prescription None    Home Resting Oxygen Prescription None    Compliance with Home Oxygen Use Yes      Initial 6 min Walk   Oxygen Used None      Program Oxygen Prescription   Program Oxygen Prescription None      Intervention   Short Term Goals To learn and understand importance of monitoring SPO2 with pulse oximeter and demonstrate accurate use of the pulse oximeter.;To learn and understand importance of maintaining oxygen saturations>88%;To learn and demonstrate proper pursed lip breathing techniques or other breathing techniques. ;To learn and demonstrate proper use of respiratory medications    Long  Term Goals Verbalizes importance of monitoring SPO2 with pulse oximeter and return demonstration;Maintenance of O2 saturations>88%;Exhibits proper breathing techniques, such as pursed lip breathing or other method taught during program session;Compliance with respiratory medication;Demonstrates proper use of MDI's             Oxygen Re-Evaluation:   Oxygen Discharge (Final Oxygen Re-Evaluation):   Initial Exercise Prescription:  Initial Exercise Prescription - 11/05/20 1500       Date of Initial Exercise RX and Referring Provider    Date 11/05/20    Referring Provider Zetta Bills MD      Recumbant Bike   Level 1    RPM 50    Watts 5    Minutes 15    METs 2      NuStep   Level 1    SPM 80    Minutes 15    METs 2      REL-XR   Level 1    Speed 50    Minutes 15    METs 2      Track  Laps 8    Minutes 15    METs 1.4      Prescription Details   Frequency (times per week) 3    Duration Progress to 30 minutes of continuous aerobic without signs/symptoms of physical distress      Intensity   THRR 40-80% of Max Heartrate 117-139    Ratings of Perceived Exertion 11-13    Perceived Dyspnea 0-4      Progression   Progression Continue to progress workloads to maintain intensity without signs/symptoms of physical distress.      Resistance Training   Training Prescription Yes    Weight 3 lb    Reps 10-15             Perform Capillary Blood Glucose checks as needed.  Exercise Prescription Changes:   Exercise Prescription Changes     Row Name 11/05/20 1500             Response to Exercise   Blood Pressure (Admit) 132/64       Blood Pressure (Exercise) 146/72       Blood Pressure (Exit) 104/62       Heart Rate (Admit) 95 bpm       Heart Rate (Exercise) 128 bpm       Heart Rate (Exit) 94 bpm       Oxygen Saturation (Admit) 98 %       Oxygen Saturation (Exercise) 98 %       Oxygen Saturation (Exit) 98 %       Rating of Perceived Exertion (Exercise) 15       Perceived Dyspnea (Exercise) 3       Symptoms SOB, hip pain (3/10)       Comments walk test results                Exercise Comments:   Exercise Comments     Row Name 11/10/20 1429           Exercise Comments First full day of exercise!  Patient was oriented to gym and equipment including functions, settings, policies, and procedures.  Patient's individual exercise prescription and treatment plan were reviewed.  All starting workloads were established based on the results of the 6 minute walk test done at initial  orientation visit.  The plan for exercise progression was also introduced and progression will be customized based on patient's performance and goals.                Exercise Goals and Review:   Exercise Goals     Row Name 11/05/20 1512             Exercise Goals   Increase Physical Activity Yes       Intervention Provide advice, education, support and counseling about physical activity/exercise needs.;Develop an individualized exercise prescription for aerobic and resistive training based on initial evaluation findings, risk stratification, comorbidities and participant's personal goals.       Expected Outcomes Long Term: Add in home exercise to make exercise part of routine and to increase amount of physical activity.;Short Term: Attend rehab on a regular basis to increase amount of physical activity.;Long Term: Exercising regularly at least 3-5 days a week.       Increase Strength and Stamina Yes       Intervention Provide advice, education, support and counseling about physical activity/exercise needs.;Develop an individualized exercise prescription for aerobic and resistive training based on initial evaluation findings, risk stratification, comorbidities and participant's personal goals.  Expected Outcomes Short Term: Increase workloads from initial exercise prescription for resistance, speed, and METs.;Short Term: Perform resistance training exercises routinely during rehab and add in resistance training at home;Long Term: Improve cardiorespiratory fitness, muscular endurance and strength as measured by increased METs and functional capacity (6MWT)       Able to understand and use rate of perceived exertion (RPE) scale Yes       Intervention Provide education and explanation on how to use RPE scale       Expected Outcomes Short Term: Able to use RPE daily in rehab to express subjective intensity level;Long Term:  Able to use RPE to guide intensity level when exercising  independently       Able to understand and use Dyspnea scale Yes       Intervention Provide education and explanation on how to use Dyspnea scale       Expected Outcomes Short Term: Able to use Dyspnea scale daily in rehab to express subjective sense of shortness of breath during exertion;Long Term: Able to use Dyspnea scale to guide intensity level when exercising independently       Knowledge and understanding of Target Heart Rate Range (THRR) Yes       Intervention Provide education and explanation of THRR including how the numbers were predicted and where they are located for reference       Expected Outcomes Short Term: Able to state/look up THRR;Short Term: Able to use daily as guideline for intensity in rehab;Long Term: Able to use THRR to govern intensity when exercising independently       Able to check pulse independently Yes       Intervention Provide education and demonstration on how to check pulse in carotid and radial arteries.;Review the importance of being able to check your own pulse for safety during independent exercise       Expected Outcomes Short Term: Able to explain why pulse checking is important during independent exercise;Long Term: Able to check pulse independently and accurately       Understanding of Exercise Prescription Yes       Intervention Provide education, explanation, and written materials on patient's individual exercise prescription       Expected Outcomes Short Term: Able to explain program exercise prescription;Long Term: Able to explain home exercise prescription to exercise independently                Exercise Goals Re-Evaluation :  Exercise Goals Re-Evaluation     Row Name 11/10/20 1429             Exercise Goal Re-Evaluation   Exercise Goals Review Able to understand and use rate of perceived exertion (RPE) scale;Able to understand and use Dyspnea scale;Knowledge and understanding of Target Heart Rate Range (THRR);Understanding of Exercise  Prescription       Comments Reviewed RPE and dyspnea scales, THR and program prescription with pt today.  Pt voiced understanding and was given a copy of goals to take home.       Expected Outcomes Short: Use RPE daily to regulate intensity. Long: Follow program prescription in THR.                Discharge Exercise Prescription (Final Exercise Prescription Changes):  Exercise Prescription Changes - 11/05/20 1500       Response to Exercise   Blood Pressure (Admit) 132/64    Blood Pressure (Exercise) 146/72    Blood Pressure (Exit) 104/62    Heart Rate (Admit) 95  bpm    Heart Rate (Exercise) 128 bpm    Heart Rate (Exit) 94 bpm    Oxygen Saturation (Admit) 98 %    Oxygen Saturation (Exercise) 98 %    Oxygen Saturation (Exit) 98 %    Rating of Perceived Exertion (Exercise) 15    Perceived Dyspnea (Exercise) 3    Symptoms SOB, hip pain (3/10)    Comments walk test results             Nutrition:  Target Goals: Understanding of nutrition guidelines, daily intake of sodium 1500mg , cholesterol 200mg , calories 30% from fat and 7% or less from saturated fats, daily to have 5 or more servings of fruits and vegetables.  Education: All About Nutrition: -Group instruction provided by verbal, written material, interactive activities, discussions, models, and posters to present general guidelines for heart healthy nutrition including fat, fiber, MyPlate, the role of sodium in heart healthy nutrition, utilization of the nutrition label, and utilization of this knowledge for meal planning. Follow up email sent as well. Written material given at graduation. Flowsheet Row Pulmonary Rehab from 11/05/2020 in [facility] Cardiac and Pulmonary Rehab  Education need identified 11/05/20       Biometrics:  Pre Biometrics - 11/05/20 1513       Pre Biometrics   Height 5' 2.5" (1.588 m)    Weight 178 lb 8 oz (81 kg)    BMI (Calculated) 32.11    Single Leg Stand 14.4 seconds               Nutrition Therapy Plan and Nutrition Goals:  Nutrition Therapy & Goals - 11/05/20 1513       Intervention Plan   Intervention Prescribe, educate and counsel regarding individualized specific dietary modifications aiming towards targeted core components such as weight, hypertension, lipid management, diabetes, heart failure and other comorbidities.    Expected Outcomes Short Term Goal: Understand basic principles of dietary content, such as calories, fat, sodium, cholesterol and nutrients.;Short Term Goal: A plan has been developed with personal nutrition goals set during dietitian appointment.;Long Term Goal: Adherence to prescribed nutrition plan.             Nutrition Assessments:  MEDIFICTS Score Key: ?70 Need to make dietary changes  40-70 Heart Healthy Diet ? 40 Therapeutic Level Cholesterol Diet  Flowsheet Row Pulmonary Rehab from 11/05/2020 in [facility] Cardiac and Pulmonary Rehab  Picture Your Plate Total Score on Admission 60      Picture Your Plate Scores: <40 Unhealthy dietary pattern with much room for improvement. 41-50 Dietary pattern unlikely to meet recommendations for good health and room for improvement. 51-60 More healthful dietary pattern, with some room for improvement.  >60 Healthy dietary pattern, although there may be some specific behaviors that could be improved.   Nutrition Goals Re-Evaluation:   Nutrition Goals Discharge (Final Nutrition Goals Re-Evaluation):   Psychosocial: Target Goals: Acknowledge presence or absence of significant depression and/or stress, maximize coping skills, provide positive support system. Participant is able to verbalize types and ability to use techniques and skills needed for reducing stress and depression.   Education: Stress, Anxiety, and Depression - Group verbal and visual presentation to define topics covered.  Reviews how body is impacted by stress, anxiety, and depression.  Also discusses healthy ways to  reduce stress and to treat/manage anxiety and depression.  Written material given at graduation.   Education: Sleep Hygiene -Provides group verbal and written instruction about how sleep can affect your health.  Define sleep  hygiene, discuss sleep cycles and impact of sleep habits. Review good sleep hygiene tips.    Initial Review & Psychosocial Screening:  Initial Psych Review & Screening - 10/29/20 1550       Initial Review   Current issues with Current Stress Concerns;Current Sleep Concerns    Source of Stress Concerns Unable to perform yard/household activities    Comments Worries about everything. [place]? Yes   husband and son     Barriers   Psychosocial barriers to participate in program There are no identifiable barriers or psychosocial needs.;The patient should benefit from training in stress management and relaxation.      Screening Interventions   Interventions Encouraged to exercise;To provide support and resources with identified psychosocial needs;Provide feedback about the scores to participant    Expected Outcomes Short Term goal: Utilizing psychosocial counselor, staff and physician to assist with identification of specific Stressors or current issues interfering with healing process. Setting desired goal for each stressor or current issue identified.;Long Term Goal: Stressors or current issues are controlled or eliminated.;Short Term goal: Identification and review with participant of any Quality of Life or Depression concerns found by scoring the questionnaire.;Long Term goal: The participant improves quality of Life and PHQ9 Scores as seen by post scores and/or verbalization of changes             Quality of Life Scores:  Scores of 19 and below usually indicate a poorer quality of life in these areas.  A difference of  2-3 points is a clinically meaningful difference.  A difference of 2-3 points in the  total score of the Quality of Life Index has been associated with significant improvement in overall quality of life, self-image, physical symptoms, and general health in studies assessing change in quality of life.  PHQ-9: Recent Review Flowsheet Data     Depression screen [facility] 2/9 11/05/2020 01/01/2015 12/25/2014 11/27/2014 11/18/2014   Decreased Interest 3 0 0 0 0   Down, Depressed, Hopeless 1 0 0 0 0   PHQ - 2 Score 4 0 0 0 0   Altered sleeping 2 - - - -   Tired, decreased energy 3 - - - -   Change in appetite 0 - - - -   Feeling bad or failure about yourself  1 - - - -   Trouble concentrating 0 - - - -   Moving slowly or fidgety/restless 0 - - - -   Suicidal thoughts 0 - - - -   PHQ-9 Score 10 - - - -   Difficult doing work/chores Somewhat difficult - - - -      Interpretation of Total Score  Total Score Depression Severity:  1-4 = Minimal depression, 5-9 = Mild depression, 10-14 = Moderate depression, 15-19 = Moderately severe depression, 20-27 = Severe depression   Psychosocial Evaluation and Intervention:  Psychosocial Evaluation - 10/29/20 1613       Psychosocial Evaluation & Interventions   Interventions Encouraged to exercise with the program and follow exercise prescription;Relaxation education    Comments Lilienne has no barriers to attending the program. She has a great support system with her husband at home. She does have some concerns with her sleep pattern and has tried melatonin that did not help. Her stress is over the fact that she cannot participate in actitivities because of fatigue or shortness of breath stopping her. She worries and cannot always  make decisions per her husband and Gayl. She is looking forward to attending the program to see if she can gain some stamina and less shortness of breath with activity. She does have back and hip chronic concerns. She is being treated with injections and meds to help control the pain . She is ready to start the program.     Expected Outcomes STG Karson is able to attend all scheduled sessions and gain some stamina to be able to do more at home with less fatigue and shortness of breath. Hoping this will reduce the stress of not participating in her daily activities.  LTG: Vantasia will be able to maintain her progress from the program    Continue Psychosocial Services  Follow up required by staff             Psychosocial Re-Evaluation:   Psychosocial Discharge (Final Psychosocial Re-Evaluation):   Education: Education Goals: Education classes will be provided on a weekly basis, covering required topics. Participant will state understanding/return demonstration of topics presented.  Learning Barriers/Preferences:   General Pulmonary Education Topics:  Infection Prevention: - Provides verbal and written material to individual with discussion of infection control including proper hand washing and proper equipment cleaning during exercise session. Flowsheet Row Pulmonary Rehab from 11/05/2020 in [facility] Cardiac and Pulmonary Rehab  Date 11/05/20  Educator [facility]  Instruction Review Code 1- Verbalizes Understanding       Falls Prevention: - Provides verbal and written material to individual with discussion of falls prevention and safety. Flowsheet Row Pulmonary Rehab from 11/05/2020 in [facility] Cardiac and Pulmonary Rehab  Date 10/29/20  Educator SB  Instruction Review Code 1- Verbalizes Understanding       Chronic Lung Disease Review: - Group verbal instruction with posters, models, PowerPoint presentations and videos,  to review new updates, new respiratory medications, new advancements in procedures and treatments. Providing information on websites and "800" numbers for continued self-education. Includes information about supplement oxygen, available portable oxygen systems, continuous and intermittent flow rates, oxygen safety, concentrators, and Medicare reimbursement for oxygen. Explanation of Pulmonary  Drugs, including class, frequency, complications, importance of spacers, rinsing mouth after steroid MDI's, and proper cleaning methods for nebulizers. Review of basic lung anatomy and physiology related to function, structure, and complications of lung disease. Review of risk factors. Discussion about methods for diagnosing sleep apnea and types of masks and machines for OSA. Includes a review of the use of types of environmental controls: home humidity, furnaces, filters, dust mite/pet prevention, HEPA vacuums. Discussion about weather changes, air quality and the benefits of nasal washing. Instruction on Warning signs, infection symptoms, calling MD promptly, preventive modes, and value of vaccinations. Review of effective airway clearance, coughing and/or vibration techniques. Emphasizing that all should Create an Action Plan. Written material given at graduation. Flowsheet Row Pulmonary Rehab from 11/05/2020 in [facility] Cardiac and Pulmonary Rehab  Education need identified 11/05/20       AED/CPR: - Group verbal and written instruction with the use of models to demonstrate the basic use of the AED with the basic ABC's of resuscitation.    Anatomy and Cardiac Procedures: - Group verbal and visual presentation and models provide information about basic cardiac anatomy and function. Reviews the testing methods done to diagnose heart disease and the outcomes of the test results. Describes the treatment choices: Medical Management, Angioplasty, or Coronary Bypass Surgery for treating various heart conditions including Myocardial Infarction, Angina, Valve Disease, and Cardiac Arrhythmias.  Written material given at graduation.  Medication Safety: - Group verbal and visual instruction to review commonly prescribed medications for heart and lung disease. Reviews the medication, class of the drug, and side effects. Includes the steps to properly store meds and maintain the prescription regimen.  Written  material given at graduation.   Other: -Provides group and verbal instruction on various topics (see comments)   Knowledge Questionnaire Score:  Knowledge Questionnaire Score - 11/05/20 1514       Knowledge Questionnaire Score   Pre Score 15/18  oxygen and nutrition              Core Components/Risk Factors/Patient Goals at Admission:  Personal Goals and Risk Factors at Admission - 11/05/20 1514       Core Components/Risk Factors/Patient Goals on Admission    Weight Management Yes;Obesity;Weight Loss    Intervention Weight Management: Develop a combined nutrition and exercise program designed to reach desired caloric intake, while maintaining appropriate intake of nutrient and fiber, sodium and fats, and appropriate energy expenditure required for the weight goal.;Weight Management/Obesity: Establish reasonable short term and long term weight goals.;Weight Management: Provide education and appropriate resources to help participant work on and attain dietary goals.;Obesity: Provide education and appropriate resources to help participant work on and attain dietary goals.    Admit Weight 178 lb 8 oz (81 kg)    Goal Weight: Short Term 170 lb (77.1 kg)    Goal Weight: Long Term 165 lb (74.8 kg)    Expected Outcomes Short Term: Continue to assess and modify interventions until short term weight is achieved;Long Term: Adherence to nutrition and physical activity/exercise program aimed toward attainment of established weight goal;Weight Loss: Understanding of general recommendations for a balanced deficit meal plan, which promotes 1-2 lb weight loss per week and includes a negative energy balance of [phone] kcal/d;Understanding recommendations for meals to include 15-35% energy as protein, 25-35% energy from fat, 35-60% energy from carbohydrates, less than 200mg  of dietary cholesterol, 20-35 gm of total fiber daily;Understanding of distribution of calorie intake throughout the day with the  consumption of 4-5 meals/snacks    Improve shortness of breath with ADL's Yes    Intervention Provide education, individualized exercise plan and daily activity instruction to help decrease symptoms of SOB with activities of daily living.    Expected Outcomes Short Term: Improve cardiorespiratory fitness to achieve a reduction of symptoms when performing ADLs;Long Term: Be able to perform more ADLs without symptoms or delay the onset of symptoms    Diabetes Yes    Intervention Provide education about signs/symptoms and action to take for hypo/hyperglycemia.;Provide education about proper nutrition, including hydration, and aerobic/resistive exercise prescription along with prescribed medications to achieve blood glucose in normal ranges: Fasting glucose 65-99 mg/dL    Expected Outcomes Short Term: Participant verbalizes understanding of the signs/symptoms and immediate care of hyper/hypoglycemia, proper foot care and importance of medication, aerobic/resistive exercise and nutrition plan for blood glucose control.;Long Term: Attainment of HbA1C < 7%.    Hypertension Yes    Intervention Provide education on lifestyle modifcations including regular physical activity/exercise, weight management, moderate sodium restriction and increased consumption of fresh fruit, vegetables, and low fat dairy, alcohol moderation, and smoking cessation.;Monitor prescription use compliance.    Expected Outcomes Short Term: Continued assessment and intervention until BP is < 140/3mm HG in hypertensive participants. < 130/60mm HG in hypertensive participants with diabetes, heart failure or chronic kidney disease.;Long Term: Maintenance of blood pressure at goal levels.    Lipids Yes  Intervention Provide education and support for participant on nutrition & aerobic/resistive exercise along with prescribed medications to achieve LDL 70mg , HDL >40mg .    Expected Outcomes Short Term: Participant states understanding of desired  cholesterol values and is compliant with medications prescribed. Participant is following exercise prescription and nutrition guidelines.;Long Term: Cholesterol controlled with medications as prescribed, with individualized exercise RX and with personalized nutrition plan. Value goals: LDL < 70mg , HDL > 40 mg.             Education:Diabetes - Individual verbal and written instruction to review signs/symptoms of diabetes, desired ranges of glucose level fasting, after meals and with exercise. Acknowledge that pre and post exercise glucose checks will be done for 3 sessions at entry of program. Flowsheet Row Pulmonary Rehab from 11/05/2020 in [facility] Cardiac and Pulmonary Rehab  Date 11/05/20  Educator [facility]  Instruction Review Code 1- Verbalizes Understanding       Know Your Numbers and Heart Failure: - Group verbal and visual instruction to discuss disease risk factors for cardiac and pulmonary disease and treatment options.  Reviews associated critical values for Overweight/Obesity, Hypertension, Cholesterol, and Diabetes.  Discusses basics of heart failure: signs/symptoms and treatments.  Introduces Heart Failure Zone chart for action plan for heart failure.  Written material given at graduation.   Core Components/Risk Factors/Patient Goals Review:    Core Components/Risk Factors/Patient Goals at Discharge (Final Review):    ITP Comments:  ITP Comments     Row Name 10/29/20 1620 11/05/20 1507 11/10/20 1429 11/12/20 0743     ITP Comments Virtual orientation call completed today. shehas an appointment on Date: 11/05/2020 for EP eval and gym Orientation.  Documentation of diagnosis can be found in [facility] Date: 10/01/2020. Completed 6MWT and gym orientation. Initial ITP created and sent for review to Dr. Zetta Bills, Medical Director. First full day of exercise!  Patient was oriented to gym and equipment including functions, settings, policies, and procedures.  Patient's individual exercise  prescription and treatment plan were reviewed.  All starting workloads were established based on the results of the 6 minute walk test done at initial orientation visit.  The plan for exercise progression was also introduced and progression will be customized based on patient's performance and goals. 30 Day review completed. Medical Director ITP review done, changes made as directed, and signed approval by Medical Director.  New to program             Comments:

## 2020-11-12 NOTE — Progress Notes (Signed)
Daily Session Note  Patient Details  Name: Dorothy Rogers MRN: [id number] Date of Birth: 01/15/1951 Referring Provider:   Flowsheet Row Pulmonary Rehab from 11/05/2020 in [facility] Cardiac and Pulmonary Rehab  Referring Provider Zetta Bills MD       Encounter Date: 11/12/2020  Check In:  Session Check In - 11/12/20 1345       Check-In   Supervising physician immediately available to respond to emergencies See telemetry face sheet for immediately available ER MD    Location [facility]-Cardiac & Pulmonary Rehab    Staff Present Birdie Sons, MPA, RN;Joseph Lou Miner, MS, ASCM CEP, Exercise Physiologist    Virtual Visit No    Medication changes reported     No    Fall or balance concerns reported    No    Warm-up and Cool-down Performed on first and last piece of equipment    Resistance Training Performed Yes    VAD Patient? No    PAD/SET Patient? No      Pain Assessment   Currently in Pain? No/denies                Social History   Tobacco Use  Smoking Status Never  Smokeless Tobacco Never    Goals Met:  Independence with exercise equipment Exercise tolerated well No report of cardiac concerns or symptoms Strength training completed today  Goals Unmet:  Not Applicable  Comments: Pt able to follow exercise prescription today without complaint.  Will continue to monitor for progression.    Dr. Emily Filbert is Medical Director for [place].  Dr. Ottie Glazier is Medical Director for [facility] Pulmonary Rehabilitation.

## 2020-11-13 ENCOUNTER — Other Ambulatory Visit: Payer: Self-pay

## 2020-11-13 ENCOUNTER — Encounter: Payer: Medicare Other | Admitting: *Deleted

## 2020-11-13 DIAGNOSIS — I272 Pulmonary hypertension, unspecified: Secondary | ICD-10-CM

## 2020-11-13 LAB — GLUCOSE, CAPILLARY
Glucose-Capillary: 117 mg/dL — ABNORMAL HIGH (ref 70–99)
Glucose-Capillary: 124 mg/dL — ABNORMAL HIGH (ref 70–99)

## 2020-11-13 NOTE — Progress Notes (Signed)
Daily Session Note  Patient Details  Name: Dorothy Rogers MRN: [id number] Date of Birth: May 02, 1951 Referring Provider:   Flowsheet Row Pulmonary Rehab from 11/05/2020 in [facility] Cardiac and Pulmonary Rehab  Referring Provider Zetta Bills MD       Encounter Date: 11/13/2020  Check In:  Session Check In - 11/13/20 1342       Check-In   Supervising physician immediately available to respond to emergencies See telemetry face sheet for immediately available ER MD    Location [facility]-Cardiac & Pulmonary Rehab    Staff Present Renita Papa, RN BSN;Joseph Hood RCP,RRT,BSRT;Melissa Royersford RDN, LDN    Virtual Visit No    Medication changes reported     No    Fall or balance concerns reported    No    Warm-up and Cool-down Performed on first and last piece of equipment    Resistance Training Performed Yes    VAD Patient? No    PAD/SET Patient? No      Pain Assessment   Currently in Pain? No/denies                Social History   Tobacco Use  Smoking Status Never  Smokeless Tobacco Never    Goals Met:  Independence with exercise equipment Exercise tolerated well No report of cardiac concerns or symptoms Strength training completed today  Goals Unmet:  Not Applicable  Comments: Pt able to follow exercise prescription today without complaint.  Will continue to monitor for progression.    Dr. Emily Filbert is Medical Director for [place].  Dr. Ottie Glazier is Medical Director for [facility] Pulmonary Rehabilitation.

## 2020-11-17 ENCOUNTER — Other Ambulatory Visit: Payer: Self-pay

## 2020-11-17 DIAGNOSIS — I272 Pulmonary hypertension, unspecified: Secondary | ICD-10-CM | POA: Diagnosis not present

## 2020-11-17 NOTE — Progress Notes (Signed)
Daily Session Note  Patient Details  Name: TORIA MONTE MRN: [id number] Date of Birth: 08-Nov-1950 Referring Provider:   Flowsheet Row Pulmonary Rehab from 11/05/2020 in [facility] Cardiac and Pulmonary Rehab  Referring Provider Zetta Bills MD       Encounter Date: 11/17/2020  Check In:  Session Check In - 11/17/20 1415       Check-In   Supervising physician immediately available to respond to emergencies See telemetry face sheet for immediately available ER MD    Location [facility]-Cardiac & Pulmonary Rehab    Staff Present Birdie Sons, MPA, Mauricia Area, BS, ACSM CEP, Exercise Physiologist;Kara Eliezer Bottom, MS, ASCM CEP, Exercise Physiologist    Virtual Visit No    Medication changes reported     No    Fall or balance concerns reported    No    Warm-up and Cool-down Performed on first and last piece of equipment    Resistance Training Performed Yes    VAD Patient? No    PAD/SET Patient? No      Pain Assessment   Currently in Pain? No/denies                Social History   Tobacco Use  Smoking Status Never  Smokeless Tobacco Never    Goals Met:  Independence with exercise equipment Exercise tolerated well No report of cardiac concerns or symptoms Strength training completed today  Goals Unmet:  Not Applicable  Comments: Pt able to follow exercise prescription today without complaint.  Will continue to monitor for progression.    Dr. Emily Filbert is Medical Director for [place].  Dr. Ottie Glazier is Medical Director for [facility] Pulmonary Rehabilitation.

## 2020-11-19 ENCOUNTER — Other Ambulatory Visit: Payer: Self-pay

## 2020-11-19 DIAGNOSIS — I272 Pulmonary hypertension, unspecified: Secondary | ICD-10-CM | POA: Diagnosis not present

## 2020-11-19 NOTE — Progress Notes (Signed)
Daily Session Note  Patient Details  Name: Dorothy Rogers MRN: [id number] Date of Birth: December 29, 1950 Referring Provider:   Flowsheet Row Pulmonary Rehab from 11/05/2020 in [facility] Cardiac and Pulmonary Rehab  Referring Provider Zetta Bills MD       Encounter Date: 11/19/2020  Check In:  Session Check In - 11/19/20 1339       Check-In   Supervising physician immediately available to respond to emergencies See telemetry face sheet for immediately available ER MD    Location [facility]-Cardiac & Pulmonary Rehab    Staff Present Birdie Sons, MPA, RN;Joseph Lou Miner, MS, ASCM CEP, Exercise Physiologist    Virtual Visit No    Medication changes reported     No    Fall or balance concerns reported    No    Warm-up and Cool-down Performed on first and last piece of equipment    Resistance Training Performed Yes    VAD Patient? No    PAD/SET Patient? No      Pain Assessment   Currently in Pain? No/denies                Social History   Tobacco Use  Smoking Status Never  Smokeless Tobacco Never    Goals Met:  Independence with exercise equipment Exercise tolerated well No report of cardiac concerns or symptoms Strength training completed today  Goals Unmet:  Not Applicable  Comments: Pt able to follow exercise prescription today without complaint.  Will continue to monitor for progression.    Dr. Emily Filbert is Medical Director for [place].  Dr. Ottie Glazier is Medical Director for [facility] Pulmonary Rehabilitation.

## 2020-11-24 ENCOUNTER — Other Ambulatory Visit: Payer: Self-pay

## 2020-11-24 DIAGNOSIS — I272 Pulmonary hypertension, unspecified: Secondary | ICD-10-CM | POA: Diagnosis not present

## 2020-11-24 NOTE — Progress Notes (Signed)
Daily Session Note  Patient Details  Name: Dorothy Rogers MRN: [id number] Date of Birth: 04/30/1951 Referring Provider:   Flowsheet Row Pulmonary Rehab from 11/05/2020 in [facility] Cardiac and Pulmonary Rehab  Referring Provider Zetta Bills MD       Encounter Date: 11/24/2020  Check In:  Session Check In - 11/24/20 1354       Check-In   Supervising physician immediately available to respond to emergencies See telemetry face sheet for immediately available ER MD    Location [facility]-Cardiac & Pulmonary Rehab    Staff Present Birdie Sons, MPA, Mauricia Area, BS, ACSM CEP, Exercise Physiologist;Kara Eliezer Bottom, MS, ASCM CEP, Exercise Physiologist    Virtual Visit No    Medication changes reported     No    Fall or balance concerns reported    No    Warm-up and Cool-down Performed on first and last piece of equipment    Resistance Training Performed Yes    VAD Patient? No    PAD/SET Patient? No      Pain Assessment   Currently in Pain? No/denies                Social History   Tobacco Use  Smoking Status Never  Smokeless Tobacco Never    Goals Met:  Independence with exercise equipment Exercise tolerated well No report of cardiac concerns or symptoms Strength training completed today  Goals Unmet:  Not Applicable  Comments: Pt able to follow exercise prescription today without complaint.  Will continue to monitor for progression.    Dr. Emily Filbert is Medical Director for [place].  Dr. Ottie Glazier is Medical Director for [facility] Pulmonary Rehabilitation.

## 2020-11-26 ENCOUNTER — Other Ambulatory Visit: Payer: Self-pay

## 2020-11-26 DIAGNOSIS — I272 Pulmonary hypertension, unspecified: Secondary | ICD-10-CM

## 2020-11-26 NOTE — Progress Notes (Signed)
Daily Session Note  Patient Details  Name: CAILEIGH CANCHE MRN: [id number] Date of Birth: 02/06/1951 Referring Provider:   Flowsheet Row Pulmonary Rehab from 11/05/2020 in [facility] Cardiac and Pulmonary Rehab  Referring Provider Zetta Bills MD       Encounter Date: 11/26/2020  Check In:  Session Check In - 11/26/20 1449       Check-In   Supervising physician immediately available to respond to emergencies See telemetry face sheet for immediately available ER MD    Location [facility]-Cardiac & Pulmonary Rehab    Staff Present Birdie Sons, MPA, Nino Glow, MS, ASCM CEP, Exercise Physiologist;Joseph Tessie Fass, RCP,RRT,BSRT;Meredith Sherryll Burger, RN BSN;Kristen Coble, RN,BC,MSN    Virtual Visit No    Medication changes reported     No    Fall or balance concerns reported    No    Warm-up and Cool-down Performed on first and last piece of equipment    Resistance Training Performed Yes    VAD Patient? No    PAD/SET Patient? No      Pain Assessment   Currently in Pain? No/denies                Social History   Tobacco Use  Smoking Status Never  Smokeless Tobacco Never    Goals Met:  Independence with exercise equipment Exercise tolerated well No report of cardiac concerns or symptoms Strength training completed today  Goals Unmet:  Not Applicable  Comments: Pt able to follow exercise prescription today without complaint.  Will continue to monitor for progression.    Dr. Emily Filbert is Medical Director for [place].  Dr. Ottie Glazier is Medical Director for [facility] Pulmonary Rehabilitation.

## 2020-11-27 ENCOUNTER — Other Ambulatory Visit: Payer: Self-pay

## 2020-11-27 ENCOUNTER — Encounter: Payer: Medicare Other | Admitting: *Deleted

## 2020-11-27 DIAGNOSIS — I272 Pulmonary hypertension, unspecified: Secondary | ICD-10-CM | POA: Diagnosis not present

## 2020-11-27 NOTE — Progress Notes (Signed)
Daily Session Note  Patient Details  Name: SHEQUITA PEPLINSKI MRN: [id number] Date of Birth: 02/13/1951 Referring Provider:   Flowsheet Row Pulmonary Rehab from 11/05/2020 in [facility] Cardiac and Pulmonary Rehab  Referring Provider Zetta Bills MD       Encounter Date: 11/27/2020  Check In:  Session Check In - 11/27/20 [place]       Check-In   Supervising physician immediately available to respond to emergencies See telemetry face sheet for immediately available ER MD    Location [facility]-Cardiac & Pulmonary Rehab    Staff Present Renita Papa, RN BSN;Jessica [place], MA, RCEP, CCRP, CCET;Joseph [place], [place]    Virtual Visit No    Medication changes reported     No    Fall or balance concerns reported    No    Warm-up and Cool-down Performed on first and last piece of equipment    Resistance Training Performed Yes    VAD Patient? No    PAD/SET Patient? No      Pain Assessment   Currently in Pain? No/denies                Social History   Tobacco Use  Smoking Status Never  Smokeless Tobacco Never    Goals Met:  Independence with exercise equipment Exercise tolerated well No report of cardiac concerns or symptoms Strength training completed today  Goals Unmet:  Not Applicable  Comments: Pt able to follow exercise prescription today without complaint.  Will continue to monitor for progression.    Dr. Emily Filbert is Medical Director for [place].  Dr. Ottie Glazier is Medical Director for [facility] Pulmonary Rehabilitation.

## 2020-12-03 ENCOUNTER — Other Ambulatory Visit: Payer: Self-pay

## 2020-12-03 ENCOUNTER — Encounter: Payer: Medicare Other | Attending: Pulmonary Disease

## 2020-12-03 DIAGNOSIS — I272 Pulmonary hypertension, unspecified: Secondary | ICD-10-CM | POA: Insufficient documentation

## 2020-12-03 NOTE — Progress Notes (Signed)
Daily Session Note  Patient Details  Name: Dorothy Rogers MRN: [id number] Date of Birth: Nov 02, 1950 Referring Provider:   Flowsheet Row Pulmonary Rehab from 11/05/2020 in [facility] Cardiac and Pulmonary Rehab  Referring Provider Zetta Bills MD       Encounter Date: 12/03/2020  Check In:  Session Check In - 12/03/20 1329       Check-In   Supervising physician immediately available to respond to emergencies See telemetry face sheet for immediately available ER MD    Location [facility]-Cardiac & Pulmonary Rehab    Staff Present Birdie Sons, MPA, Nino Glow, MS, ASCM CEP, Exercise Physiologist;Joseph Tessie Fass, [place]    Virtual Visit No    Medication changes reported     No    Fall or balance concerns reported    No    Warm-up and Cool-down Performed on first and last piece of equipment    Resistance Training Performed Yes    VAD Patient? No    PAD/SET Patient? No      Pain Assessment   Currently in Pain? No/denies                Social History   Tobacco Use  Smoking Status Never  Smokeless Tobacco Never    Goals Met:  Independence with exercise equipment Exercise tolerated well Personal goals reviewed No report of cardiac concerns or symptoms Strength training completed today  Goals Unmet:  Not Applicable  Comments: Pt able to follow exercise prescription today without complaint.  Will continue to monitor for progression.    Dr. Emily Filbert is Medical Director for [place].  Dr. Ottie Glazier is Medical Director for [facility] Pulmonary Rehabilitation.

## 2020-12-04 ENCOUNTER — Encounter: Payer: Medicare Other | Admitting: *Deleted

## 2020-12-04 ENCOUNTER — Other Ambulatory Visit: Payer: Self-pay

## 2020-12-04 DIAGNOSIS — I272 Pulmonary hypertension, unspecified: Secondary | ICD-10-CM

## 2020-12-04 NOTE — Progress Notes (Signed)
Daily Session Note  Patient Details  Name: Dorothy Rogers MRN: [id number] Date of Birth: 02-16-1951 Referring Provider:   Flowsheet Row Pulmonary Rehab from 11/05/2020 in [facility] Cardiac and Pulmonary Rehab  Referring Provider Zetta Bills MD       Encounter Date: 12/04/2020  Check In:  Session Check In - 12/04/20 1329       Check-In   Supervising physician immediately available to respond to emergencies See telemetry face sheet for immediately available ER MD    Location [facility]-Cardiac & Pulmonary Rehab    Staff Present Renita Papa, RN BSN;Jessica Luan Pulling, MA, RCEP, CCRP, CCET;Melissa [place], RDN, Tawanna Solo, MS, ASCM CEP, Exercise Physiologist    Virtual Visit No    Medication changes reported     No    Fall or balance concerns reported    No    Warm-up and Cool-down Performed on first and last piece of equipment    Resistance Training Performed Yes    VAD Patient? No    PAD/SET Patient? No      Pain Assessment   Currently in Pain? No/denies                Social History   Tobacco Use  Smoking Status Never  Smokeless Tobacco Never    Goals Met:  Independence with exercise equipment Exercise tolerated well No report of cardiac concerns or symptoms Strength training completed today  Goals Unmet:  Not Applicable  Comments: Pt able to follow exercise prescription today without complaint.  Will continue to monitor for progression.    Dr. Emily Filbert is Medical Director for [place].  Dr. Ottie Glazier is Medical Director for [facility] Pulmonary Rehabilitation.

## 2020-12-08 ENCOUNTER — Encounter: Payer: Medicare Other | Admitting: *Deleted

## 2020-12-08 ENCOUNTER — Other Ambulatory Visit: Payer: Self-pay

## 2020-12-08 DIAGNOSIS — I272 Pulmonary hypertension, unspecified: Secondary | ICD-10-CM | POA: Diagnosis not present

## 2020-12-08 NOTE — Progress Notes (Signed)
Daily Session Note  Patient Details  Name: Dorothy Rogers MRN: [id number] Date of Birth: 09-Nov-1950 Referring Provider:   Flowsheet Row Pulmonary Rehab from 11/05/2020 in [facility] Cardiac and Pulmonary Rehab  Referring Provider Zetta Bills MD       Encounter Date: 12/08/2020  Check In:  Session Check In - 12/08/20 1400       Check-In   Supervising physician immediately available to respond to emergencies See telemetry face sheet for immediately available ER MD    Location [facility]-Cardiac & Pulmonary Rehab    Staff Present Heath Lark, RN, BSN, Laveda Norman, BS, ACSM CEP, Exercise Physiologist;Kelly Rosalia Hammers, MPA, RN    Virtual Visit No    Medication changes reported     No    Fall or balance concerns reported    No    Warm-up and Cool-down Performed on first and last piece of equipment    Resistance Training Performed Yes    VAD Patient? No    PAD/SET Patient? No      Pain Assessment   Currently in Pain? No/denies                Social History   Tobacco Use  Smoking Status Never  Smokeless Tobacco Never    Goals Met:  Proper associated with RPD/PD & O2 Sat Independence with exercise equipment Exercise tolerated well No report of cardiac concerns or symptoms  Goals Unmet:  Not Applicable  Comments: Pt able to follow exercise prescription today without complaint.  Will continue to monitor for progression.    Dr. Emily Filbert is Medical Director for [place].  Dr. Ottie Glazier is Medical Director for [facility] Pulmonary Rehabilitation.

## 2020-12-10 ENCOUNTER — Other Ambulatory Visit: Payer: Self-pay

## 2020-12-10 ENCOUNTER — Encounter: Payer: Self-pay | Admitting: *Deleted

## 2020-12-10 ENCOUNTER — Encounter: Payer: Medicare Other | Admitting: *Deleted

## 2020-12-10 DIAGNOSIS — I272 Pulmonary hypertension, unspecified: Secondary | ICD-10-CM

## 2020-12-10 NOTE — Progress Notes (Signed)
Pulmonary Individual Treatment Plan  Patient Details  Name: Dorothy Rogers MRN: [id number] Date of Birth: [birth year] Referring Provider:   Flowsheet Row Pulmonary Rehab from 11/05/2020 in [facility] Cardiac and Pulmonary Rehab  Referring Provider Zetta Bills MD       Initial Encounter Date:  Flowsheet Row Pulmonary Rehab from 11/05/2020 in [facility] Cardiac and Pulmonary Rehab  Date 11/05/20       Visit Diagnosis: Pulmonary hypertension ([place])  Patient's Home Medications on Admission:  Current Outpatient Medications:    acetaminophen (TYLENOL) 500 MG tablet, Take 1,000 mg by mouth every 6 (six) hours as needed for mild pain or moderate pain. , Disp: , Rfl:    acyclovir (ZOVIRAX) 400 MG tablet, Take 400 mg by mouth daily as needed (for fever blisters). , Disp: , Rfl:    aspirin EC 81 MG EC tablet, Take 1 tablet (81 mg total) by mouth daily. (Patient taking differently: Take 81 mg by mouth at bedtime.), Disp: , Rfl:    atorvastatin (LIPITOR) 40 MG tablet, Take 1 tablet by mouth once daily, Disp: , Rfl:    Biotin 10 MG TABS, Take by mouth., Disp: , Rfl:    Calcium Carb-Cholecalciferol (CALTRATE 600+D3 PO), Take 1 tablet by mouth daily., Disp: , Rfl:    Calcium Carbonate-Vitamin D 600-400 MG-UNIT tablet, Take by mouth. (Patient not taking: Reported on 10/29/2020), Disp: , Rfl:    CARTIA XT 120 MG 24 hr capsule, Take 120 mg by mouth daily., Disp: , Rfl: 3   Cholecalciferol (VITAMIN D-3) 125 MCG (5000 UT) TABS, Take 5,000 Units by mouth daily., Disp: , Rfl:    clopidogrel (PLAVIX) 75 MG tablet, Take 1 tablet by mouth once daily, Disp: , Rfl:    conjugated estrogens (PREMARIN) vaginal cream, Place 0.5 g vaginally 2 (two) times a week. (Patient not taking: Reported on 10/29/2020), Disp: , Rfl:    Cyanocobalamin (VITAMIN B-12) 5000 MCG SUBL, Place 5,000 mcg under the tongue daily., Disp: , Rfl:    diazepam (VALIUM) 2 MG tablet, Take by mouth., Disp: , Rfl:    ferrous sulfate 325 (65 FE) MG tablet, Take  325 mg by mouth daily with breakfast., Disp: , Rfl:    gabapentin (NEURONTIN) 100 MG capsule, Take by mouth., Disp: , Rfl:    glipiZIDE (GLUCOTROL) 10 MG tablet, Take 10 mg by mouth 2 (two) times daily. Before breakfast & before supper, Disp: , Rfl:    ibuprofen (ADVIL) 200 MG tablet, Take 400 mg by mouth every 8 (eight) hours as needed (for pain.)., Disp: , Rfl:    insulin glargine (LANTUS SOLOSTAR) 100 UNIT/ML Solostar Pen, INJECT 36 TO 50 UNITS SUBCUTANEOUSLY ONCE DAILY AS DIRECTED, Disp: , Rfl:    insulin lispro (HUMALOG) 100 UNIT/ML injection, Inject into the skin 3 (three) times daily before meals. Sliding scale, Disp: , Rfl:    loperamide (IMODIUM) 2 MG capsule, Take by mouth., Disp: , Rfl:    losartan (COZAAR) 100 MG tablet, Take 1 tablet by mouth once daily, Disp: , Rfl:    melatonin 1 MG TABS tablet, Take by mouth. (Patient not taking: Reported on 10/29/2020), Disp: , Rfl:    ondansetron (ZOFRAN-ODT) 8 MG disintegrating tablet, Take 8 mg by mouth as needed for nausea or vomiting., Disp: , Rfl:    pantoprazole (PROTONIX) 20 MG tablet, Take by mouth., Disp: , Rfl:    Probiotic CAPS, Take 1 capsule by mouth daily., Disp: , Rfl:    promethazine (PHENERGAN) 12.5 MG tablet, Take 1  tablet (12.5 mg total) by mouth every 6 (six) hours as needed for nausea or vomiting., Disp: 15 tablet, Rfl: 0 No current facility-administered medications for this visit.  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   Cataract    Chronic kidney disease    Stage III   Coronary artery disease    DDD (degenerative disc  disease), lumbar    Diabetes mellitus without complication ([place])    TYPE 2   Herpes simplex    Hyperlipidemia    Hypertension    CONTROLLED ON MEDS   Vertigo    occasional, no episodes in 2-3 months   Vertigo    OCCASIONALLY   Vitamin B 12 deficiency     Tobacco Use: Social History   Tobacco Use  Smoking Status Never  Smokeless Tobacco Never    Labs: Recent Review Flowsheet Data     Labs for ITP Cardiac and Pulmonary Rehab Latest Ref Rng & Units 06/09/2015 12/01/2015   Hemoglobin A1c 4.0 - 6.0 % 6.5(H) -   PHART 7.350 - 7.450 - 7.67(HH)   PCO2ART 32.0 - 48.0 mmHg - 17(LL)        Pulmonary Assessment Scores:  Pulmonary Assessment Scores     Row Name 10/29/20 1609 11/05/20 1517       ADL UCSD   ADL Phase Entry Entry    SOB Score total 88 88    Rest 2 2    Walk 4 4    Stairs 4 4    Bath 5 5    Dress 4 4    Shop 4 4         CAT Score      CAT Score 15 15         mMRC Score      mMRC Score -- 4            UCSD: Self-administered rating of dyspnea associated with activities of daily living (ADLs) 6-point scale (0 = "not at all" to 5 = "maximal or unable to do because of breathlessness")  Scoring Scores range from 0 to 120.  Minimally important difference is 5 units  CAT: CAT can identify the health impairment of COPD patients and is better correlated with disease progression.  CAT has a scoring range of zero to 40. The CAT score is classified into four groups of low (less than 10), medium (10 - 20), high (21-30) and very high (31-40) based on the impact level of disease on health status. A CAT score over 10 suggests significant symptoms.  A worsening CAT score could be explained by an exacerbation, poor medication adherence, poor inhaler technique, or progression of COPD or comorbid conditions.  CAT MCID is 2 points  mMRC: mMRC (Modified Medical Research Council) Dyspnea Scale is used to assess the degree of baseline functional disability in patients of  respiratory disease due to dyspnea. No minimal important difference is established. A decrease in score of 1 point or greater is considered a positive change.   Pulmonary Function Assessment:  Pulmonary Function Assessment - 11/05/20 1516       Breath   Shortness of Breath Yes;Panic with Shortness of Breath;Fear of Shortness of Breath;Limiting activity             Exercise Target Goals: Exercise Program Goal: Individual exercise prescription set using results from initial 6 min walk test and THRR while considering  patient's activity barriers and safety.   Exercise Prescription Goal: Initial exercise prescription builds to 30-45 minutes a day of aerobic activity, 2-3 days per week.  Home exercise guidelines will be given to patient during program as part of exercise prescription that the participant will acknowledge.  Education: Aerobic Exercise: - Group verbal and visual presentation on the components of exercise prescription. Introduces F.I.T.T principle from ACSM for exercise prescriptions.  Reviews F.I.T.T. principles of aerobic exercise including progression. Written material given at graduation.   Education: Resistance Exercise: - Group verbal and visual presentation on the components of exercise prescription. Introduces F.I.T.T principle from ACSM for exercise prescriptions  Reviews F.I.T.T. principles of resistance exercise including progression. Written material given at graduation. Flowsheet Row Pulmonary Rehab from 12/03/2020 in [facility] Cardiac and Pulmonary Rehab  Date 11/26/20  Educator [place]  Instruction Review Code 1- Verbalizes Understanding        Education: Exercise & Equipment Safety: - Individual verbal instruction and demonstration of equipment use and safety with use of the equipment. Flowsheet Row Pulmonary Rehab from 12/03/2020 in [facility] Cardiac and Pulmonary Rehab  Date 11/05/20  Educator [facility]  Instruction Review Code 1- Verbalizes Understanding        Education: Exercise Physiology & General Exercise Guidelines: - Group verbal and written instruction with models to review the exercise physiology of the cardiovascular system and associated critical values. Provides general exercise guidelines with specific guidelines to those with heart or lung disease.  Flowsheet Row Pulmonary Rehab from 12/03/2020 in [facility] Cardiac and Pulmonary Rehab  Date 11/12/20  Educator [facility]  Instruction Review Code 1- Verbalizes Understanding       Education: Flexibility, Balance, Mind/Body Relaxation: - Group verbal and visual presentation with interactive activity on the components of exercise prescription. Introduces F.I.T.T principle from ACSM for exercise prescriptions. Reviews F.I.T.T. principles of flexibility and balance exercise training including progression. Also discusses the mind body connection.  Reviews various relaxation techniques to help reduce and manage stress (i.e. Deep breathing, progressive muscle relaxation, and visualization). Balance handout provided to take home. Written material given at graduation. Flowsheet Row Pulmonary Rehab from 12/03/2020 in [facility] Cardiac and Pulmonary Rehab  Date 12/03/20  Educator [facility]  Instruction Review Code 1- Verbalizes Understanding       Activity Barriers & Risk Stratification:  Activity Barriers & Cardiac Risk Stratification - 11/05/20 1510       Activity Barriers & Cardiac Risk Stratification   Activity Barriers Arthritis;Back Problems;Neck/Spine Problems;Muscular Weakness;Deconditioning;Balance Concerns;Shortness of Breath;Assistive Device;Joint Problems             6 Minute Walk:  6 Minute Walk     Row Name 11/05/20 1508         6 Minute Walk   Phase Initial     Distance 296 feet     Walk Time 4.88 minutes     # of Rest Breaks 5  10 sec, 17 sec, 12 sec, 10 sec, stopped at 5:32     MPH 0.67     METS 1.24     RPE 15     Perceived Dyspnea  3  VO2 Peak 4.35     Symptoms Yes (comment)      Comments SOB, chronic hip pain 3/10     Resting HR 95 bpm     Resting BP 132/64     Resting Oxygen Saturation  98 %     Exercise Oxygen Saturation  during 6 min walk 98 %     Max Ex. HR 128 bpm     Max Ex. BP 146/72     2 Minute Post BP 130/64           Interval HR     1 Minute HR 127     2 Minute HR 128     3 Minute HR 127     4 Minute HR 123     5 Minute HR 124     6 Minute HR 113     2 Minute Post HR 106     Interval Heart Rate? Yes           Interval Oxygen     Interval Oxygen? Yes     Baseline Oxygen Saturation % 98 %     1 Minute Oxygen Saturation % 98 %     1 Minute Liters of Oxygen 0 L  Room Air     2 Minute Oxygen Saturation % 99 %     2 Minute Liters of Oxygen 0 L     3 Minute Oxygen Saturation % 99 %     3 Minute Liters of Oxygen 0 L     4 Minute Oxygen Saturation % 98 %     4 Minute Liters of Oxygen 0 L     5 Minute Oxygen Saturation % 98 %     5 Minute Liters of Oxygen 0 L     6 Minute Oxygen Saturation % 98 %     6 Minute Liters of Oxygen 0 L     2 Minute Post Oxygen Saturation % 98 %     2 Minute Post Liters of Oxygen 0 L            Oxygen Initial Assessment:  Oxygen Initial Assessment - 11/05/20 1516       Home Oxygen   Home Oxygen Device None    Sleep Oxygen Prescription None    Home Exercise Oxygen Prescription None    Home Resting Oxygen Prescription None    Compliance with Home Oxygen Use Yes      Initial 6 min Walk   Oxygen Used None      Program Oxygen Prescription   Program Oxygen Prescription None      Intervention   Short Term Goals To learn and understand importance of monitoring SPO2 with pulse oximeter and demonstrate accurate use of the pulse oximeter.;To learn and understand importance of maintaining oxygen saturations>88%;To learn and demonstrate proper pursed lip breathing techniques or other breathing techniques. ;To learn and demonstrate proper use of respiratory medications    Long  Term Goals Verbalizes importance  of monitoring SPO2 with pulse oximeter and return demonstration;Maintenance of O2 saturations>88%;Exhibits proper breathing techniques, such as pursed lip breathing or other method taught during program session;Compliance with respiratory medication;Demonstrates proper use of MDI's             Oxygen Re-Evaluation:  Oxygen Re-Evaluation     Row Name 11/27/20 1337             Program Oxygen Prescription   Program Oxygen Prescription None  Home Oxygen     Home Oxygen Device None       Sleep Oxygen Prescription None       Home Exercise Oxygen Prescription None       Home Resting Oxygen Prescription None       Compliance with Home Oxygen Use Yes               Goals/Expected Outcomes     Short Term Goals To learn and demonstrate proper pursed lip breathing techniques or other breathing techniques.        Long  Term Goals Exhibits proper breathing techniques, such as pursed lip breathing or other method taught during program session       Comments Informed patient how to perform the Pursed Lipped breathing technique. Told patient to Inhale through the nose and out the mouth with pursed lips to keep their airways open, help oxygenate them better, practice when at rest or doing strenuous activity. Patient Verbalizes understanding of technique and will work on and be reiterated during [place].       Goals/Expected Outcomes Short: use PLB with exertion. Long: use PLB on exertion proficiently and independently.               Oxygen Discharge (Final Oxygen Re-Evaluation):  Oxygen Re-Evaluation - 11/27/20 1337       Program Oxygen Prescription   Program Oxygen Prescription None      Home Oxygen   Home Oxygen Device None    Sleep Oxygen Prescription None    Home Exercise Oxygen Prescription None    Home Resting Oxygen Prescription None    Compliance with Home Oxygen Use Yes      Goals/Expected Outcomes   Short Term Goals To learn and demonstrate proper pursed  lip breathing techniques or other breathing techniques.     Long  Term Goals Exhibits proper breathing techniques, such as pursed lip breathing or other method taught during program session    Comments Informed patient how to perform the Pursed Lipped breathing technique. Told patient to Inhale through the nose and out the mouth with pursed lips to keep their airways open, help oxygenate them better, practice when at rest or doing strenuous activity. Patient Verbalizes understanding of technique and will work on and be reiterated during [place].    Goals/Expected Outcomes Short: use PLB with exertion. Long: use PLB on exertion proficiently and independently.             Initial Exercise Prescription:  Initial Exercise Prescription - 11/05/20 1500       Date of Initial Exercise RX and Referring Provider   Date 11/05/20    Referring Provider Zetta Bills MD      Recumbant Bike   Level 1    RPM 50    Watts 5    Minutes 15    METs 2      NuStep   Level 1    SPM 80    Minutes 15    METs 2      REL-XR   Level 1    Speed 50    Minutes 15    METs 2      Track   Laps 8    Minutes 15    METs 1.4      Prescription Details   Frequency (times per week) 3    Duration Progress to 30 minutes of continuous aerobic without signs/symptoms of physical distress      Intensity   THRR  40-80% of Max Heartrate 117-139    Ratings of Perceived Exertion 11-13    Perceived Dyspnea 0-4      Progression   Progression Continue to progress workloads to maintain intensity without signs/symptoms of physical distress.      Resistance Training   Training Prescription Yes    Weight 3 lb    Reps 10-15             Perform Capillary Blood Glucose checks as needed.  Exercise Prescription Changes:   Exercise Prescription Changes     Row Name 11/05/20 1500 11/18/20 1500 12/04/20 0800         Response to Exercise   Blood Pressure (Admit) 132/64 132/82 130/62     Blood Pressure  (Exercise) 146/72 142/80 174/78     Blood Pressure (Exit) 104/62 134/62 112/70     Heart Rate (Admit) 95 bpm 100 bpm 96 bpm     Heart Rate (Exercise) 128 bpm 101 bpm 116 bpm     Heart Rate (Exit) 94 bpm 106 bpm 106 bpm     Oxygen Saturation (Admit) 98 % 93 % 96 %     Oxygen Saturation (Exercise) 98 % 98 % 98 %     Oxygen Saturation (Exit) 98 % 99 % 99 %     Rating of Perceived Exertion (Exercise) 15 13 12      Perceived Dyspnea (Exercise) 3 3 3      Symptoms SOB, hip pain (3/10) SOB SOB     Comments walk test results -- --     Duration -- Progress to 30 minutes of  aerobic without signs/symptoms of physical distress Progress to 30 minutes of  aerobic without signs/symptoms of physical distress     Intensity -- THRR unchanged THRR unchanged           Progression       Progression -- Continue to progress workloads to maintain intensity without signs/symptoms of physical distress. Continue to progress workloads to maintain intensity without signs/symptoms of physical distress.     Average METs -- 1.83 2.44           Resistance Training       Training Prescription -- Yes Yes     Weight -- 3 lb 3 lb     Reps -- 10-15 10-15           Interval Training       Interval Training -- No No           NuStep       Level -- 1 --     Minutes -- 15 --     METs -- 1.8 --           REL-XR       Level -- 1 1     Minutes -- 15 30     METs -- 1.9 3.2           T5 Nustep       Level -- 1 1     Minutes -- 15 30     METs -- 1.8 1.8           Track       Laps -- -- 4     Minutes -- -- 15     METs -- -- 1.2             Exercise Comments:   Exercise Comments     Row Name 11/10/20 1429  Exercise Comments First full day of exercise!  Patient was oriented to gym and equipment including functions, settings, policies, and procedures.  Patient's individual exercise prescription and treatment plan were reviewed.  All starting workloads were established based on the results of  the 6 minute walk test done at initial orientation visit.  The plan for exercise progression was also introduced and progression will be customized based on patient's performance and goals.                Exercise Goals and Review:   Exercise Goals     Row Name 11/05/20 1512             Exercise Goals   Increase Physical Activity Yes       Intervention Provide advice, education, support and counseling about physical activity/exercise needs.;Develop an individualized exercise prescription for aerobic and resistive training based on initial evaluation findings, risk stratification, comorbidities and participant's personal goals.       Expected Outcomes Long Term: Add in home exercise to make exercise part of routine and to increase amount of physical activity.;Short Term: Attend rehab on a regular basis to increase amount of physical activity.;Long Term: Exercising regularly at least 3-5 days a week.       Increase Strength and Stamina Yes       Intervention Provide advice, education, support and counseling about physical activity/exercise needs.;Develop an individualized exercise prescription for aerobic and resistive training based on initial evaluation findings, risk stratification, comorbidities and participant's personal goals.       Expected Outcomes Short Term: Increase workloads from initial exercise prescription for resistance, speed, and METs.;Short Term: Perform resistance training exercises routinely during rehab and add in resistance training at home;Long Term: Improve cardiorespiratory fitness, muscular endurance and strength as measured by increased METs and functional capacity (6MWT)       Able to understand and use rate of perceived exertion (RPE) scale Yes       Intervention Provide education and explanation on how to use RPE scale       Expected Outcomes Short Term: Able to use RPE daily in rehab to express subjective intensity level;Long Term:  Able to use RPE to guide  intensity level when exercising independently       Able to understand and use Dyspnea scale Yes       Intervention Provide education and explanation on how to use Dyspnea scale       Expected Outcomes Short Term: Able to use Dyspnea scale daily in rehab to express subjective sense of shortness of breath during exertion;Long Term: Able to use Dyspnea scale to guide intensity level when exercising independently       Knowledge and understanding of Target Heart Rate Range (THRR) Yes       Intervention Provide education and explanation of THRR including how the numbers were predicted and where they are located for reference       Expected Outcomes Short Term: Able to state/look up THRR;Short Term: Able to use daily as guideline for intensity in rehab;Long Term: Able to use THRR to govern intensity when exercising independently       Able to check pulse independently Yes       Intervention Provide education and demonstration on how to check pulse in carotid and radial arteries.;Review the importance of being able to check your own pulse for safety during independent exercise       Expected Outcomes Short Term: Able to explain why pulse checking is  important during independent exercise;Long Term: Able to check pulse independently and accurately       Understanding of Exercise Prescription Yes       Intervention Provide education, explanation, and written materials on patient's individual exercise prescription       Expected Outcomes Short Term: Able to explain program exercise prescription;Long Term: Able to explain home exercise prescription to exercise independently                Exercise Goals Re-Evaluation :  Exercise Goals Re-Evaluation     Row Name 11/10/20 1429 11/18/20 1517 12/04/20 0833         Exercise Goal Re-Evaluation   Exercise Goals Review Able to understand and use rate of perceived exertion (RPE) scale;Able to understand and use Dyspnea scale;Knowledge and understanding of  Target Heart Rate Range (THRR);Understanding of Exercise Prescription Increase Physical Activity;Increase Strength and Stamina;Understanding of Exercise Prescription Increase Physical Activity;Increase Strength and Stamina;Understanding of Exercise Prescription     Comments Reviewed RPE and dyspnea scales, THR and program prescription with pt today.  Pt voiced understanding and was given a copy of goals to take home. Bambi is doing well in rehab.  She has completed her first four full exercise sessions.  We will start to increase her workloads and continue to monitor her progress. Jacey is doing well. Her O2 sats remain in appropriate range while exercising and has now tried to walk the track with her walker in which she should build up tolerance overtime. Will continue to monitor her progress.     Expected Outcomes Short: Use RPE daily to regulate intensity. Long: Follow program prescription in THR. Short: Increase workloads Long: Continue to improve stamina Short: Increase number of laps on track Long: Continue to increase overall MET level              Discharge Exercise Prescription (Final Exercise Prescription Changes):  Exercise Prescription Changes - 12/04/20 0800       Response to Exercise   Blood Pressure (Admit) 130/62    Blood Pressure (Exercise) 174/78    Blood Pressure (Exit) 112/70    Heart Rate (Admit) 96 bpm    Heart Rate (Exercise) 116 bpm    Heart Rate (Exit) 106 bpm    Oxygen Saturation (Admit) 96 %    Oxygen Saturation (Exercise) 98 %    Oxygen Saturation (Exit) 99 %    Rating of Perceived Exertion (Exercise) 12    Perceived Dyspnea (Exercise) 3    Symptoms SOB    Duration Progress to 30 minutes of  aerobic without signs/symptoms of physical distress    Intensity THRR unchanged      Progression   Progression Continue to progress workloads to maintain intensity without signs/symptoms of physical distress.    Average METs 2.44      Resistance Training   Training  Prescription Yes    Weight 3 lb    Reps 10-15      Interval Training   Interval Training No      REL-XR   Level 1    Minutes 30    METs 3.2      T5 Nustep   Level 1    Minutes 30    METs 1.8      Track   Laps 4    Minutes 15    METs 1.2             Nutrition:  Target Goals: Understanding of nutrition guidelines, daily intake of sodium <  $'1500mg'M$ , cholesterol '200mg'$ , calories 30% from fat and 7% or less from saturated fats, daily to have 5 or more servings of fruits and vegetables.  Education: All About Nutrition: -Group instruction provided by verbal, written material, interactive activities, discussions, models, and posters to present general guidelines for heart healthy nutrition including fat, fiber, MyPlate, the role of sodium in heart healthy nutrition, utilization of the nutrition label, and utilization of this knowledge for meal planning. Follow up email sent as well. Written material given at graduation. Flowsheet Row Pulmonary Rehab from 12/03/2020 in [facility] Cardiac and Pulmonary Rehab  Education need identified 11/05/20       Biometrics:  Pre Biometrics - 11/05/20 1513       Pre Biometrics   Height 5' 2.5" (1.588 m)    Weight 178 lb 8 oz (81 kg)    BMI (Calculated) 32.11    Single Leg Stand 14.4 seconds              Nutrition Therapy Plan and Nutrition Goals:  Nutrition Therapy & Goals - 11/17/20 1432       Nutrition Therapy   Diet Heart healthy, low Na, diabetes friendly eating    Drug/Food Interactions Statins/Certain Fruits    Protein (specify units) 65g    Fiber 25 grams    Whole Grain Foods 3 servings    Saturated Fats 12 max. grams    Fruits and Vegetables 8 servings/day    Sodium 1.5 grams      Personal Nutrition Goals   Nutrition Goal ST: practice CHO counting at meals LT: follow MyPlate guidelines    Comments B: varies: this morning was egg and bacon and 2/3 toast (white) with some berries. With a cup of hot tea (equal). L sandwich  or 1/2 sandwich (tomato, tuna, bolonga). With water. D: varies: tonight - hamburgers - will only have chips if BG is ok. Salmon cakes, chicken, pork chops. Chicken and dumplings, pizza 2x/month, quesadillas. S: cheese puffs or cheese or peanut butter. Drinks: water. CKD stg 3. She will tak eher Lantus in AM, she also has humulog and usually does not need it. Discussed heart healthy, diabetes friendly.      Intervention Plan   Intervention Prescribe, educate and counsel regarding individualized specific dietary modifications aiming towards targeted core components such as weight, hypertension, lipid management, diabetes, heart failure and other comorbidities.    Expected Outcomes Short Term Goal: Understand basic principles of dietary content, such as calories, fat, sodium, cholesterol and nutrients.;Short Term Goal: A plan has been developed with personal nutrition goals set during dietitian appointment.;Long Term Goal: Adherence to prescribed nutrition plan.             Nutrition Assessments:  MEDIFICTS Score Key: ?70 Need to make dietary changes  40-70 Heart Healthy Diet ? 40 Therapeutic Level Cholesterol Diet  Flowsheet Row Pulmonary Rehab from 11/05/2020 in [facility] Cardiac and Pulmonary Rehab  Picture Your Plate Total Score on Admission 60      Picture Your Plate Scores: <27 Unhealthy dietary pattern with much room for improvement. 41-50 Dietary pattern unlikely to meet recommendations for good health and room for improvement. 51-60 More healthful dietary pattern, with some room for improvement.  >60 Healthy dietary pattern, although there may be some specific behaviors that could be improved.   Nutrition Goals Re-Evaluation:   Nutrition Goals Discharge (Final Nutrition Goals Re-Evaluation):   Psychosocial: Target Goals: Acknowledge presence or absence of significant depression and/or stress, maximize coping skills, provide positive support system.  Participant is able to verbalize  types and ability to use techniques and skills needed for reducing stress and depression.   Education: Stress, Anxiety, and Depression - Group verbal and visual presentation to define topics covered.  Reviews how body is impacted by stress, anxiety, and depression.  Also discusses healthy ways to reduce stress and to treat/manage anxiety and depression.  Written material given at graduation.   Education: Sleep Hygiene -Provides group verbal and written instruction about how sleep can affect your health.  Define sleep hygiene, discuss sleep cycles and impact of sleep habits. Review good sleep hygiene tips.    Initial Review & Psychosocial Screening:  Initial Psych Review & Screening - 10/29/20 1550       Initial Review   Current issues with Current Stress Concerns;Current Sleep Concerns    Source of Stress Concerns Unable to perform yard/household activities    Comments Worries about everything. [place]? Yes   husband and son     Barriers   Psychosocial barriers to participate in program There are no identifiable barriers or psychosocial needs.;The patient should benefit from training in stress management and relaxation.      Screening Interventions   Interventions Encouraged to exercise;To provide support and resources with identified psychosocial needs;Provide feedback about the scores to participant    Expected Outcomes Short Term goal: Utilizing psychosocial counselor, staff and physician to assist with identification of specific Stressors or current issues interfering with healing process. Setting desired goal for each stressor or current issue identified.;Long Term Goal: Stressors or current issues are controlled or eliminated.;Short Term goal: Identification and review with participant of any Quality of Life or Depression concerns found by scoring the questionnaire.;Long Term goal: The participant improves quality of Life  and PHQ9 Scores as seen by post scores and/or verbalization of changes             Quality of Life Scores:  Scores of 19 and below usually indicate a poorer quality of life in these areas.  A difference of  2-3 points is a clinically meaningful difference.  A difference of 2-3 points in the total score of the Quality of Life Index has been associated with significant improvement in overall quality of life, self-image, physical symptoms, and general health in studies assessing change in quality of life.  PHQ-9: Recent Review Flowsheet Data     Depression screen [facility] 2/9 11/27/2020 11/05/2020 01/01/2015 12/25/2014 11/27/2014   Decreased Interest 3 3 0 0 0   Down, Depressed, Hopeless 0 1 0 0 0   PHQ - 2 Score 3 4 0 0 0   Altered sleeping 1 2 - - -   Tired, decreased energy 3 3 - - -   Change in appetite 0 0 - - -   Feeling bad or failure about yourself  0 1 - - -   Trouble concentrating 0 0 - - -   Moving slowly or fidgety/restless 0 0 - - -   Suicidal thoughts 0 0 - - -   PHQ-9 Score 7 10 - - -   Difficult doing work/chores Somewhat difficult Somewhat difficult - - -      Interpretation of Total Score  Total Score Depression Severity:  1-4 = Minimal depression, 5-9 = Mild depression, 10-14 = Moderate depression, 15-19 = Moderately severe depression, 20-27 = Severe depression   Psychosocial Evaluation and Intervention:  Psychosocial Evaluation - 10/29/20 1613  Psychosocial Evaluation & Interventions   Interventions Encouraged to exercise with the program and follow exercise prescription;Relaxation education    Comments Novice has no barriers to attending the program. She has a great support system with her husband at home. She does have some concerns with her sleep pattern and has tried melatonin that did not help. Her stress is over the fact that she cannot participate in actitivities because of fatigue or shortness of breath stopping her. She worries and cannot always make  decisions per her husband and Lennix. She is looking forward to attending the program to see if she can gain some stamina and less shortness of breath with activity. She does have back and hip chronic concerns. She is being treated with injections and meds to help control the pain . She is ready to start the program.    Expected Outcomes STG Shiri is able to attend all scheduled sessions and gain some stamina to be able to do more at home with less fatigue and shortness of breath. Hoping this will reduce the stress of not participating in her daily activities.  LTG: Lanaysia will be able to maintain her progress from the program    Continue Psychosocial Services  Follow up required by staff             Psychosocial Re-Evaluation:  Psychosocial Re-Evaluation     [place] Name 11/27/20 1343             Psychosocial Re-Evaluation   Current issues with Current Anxiety/Panic;Current Psychotropic Meds       Comments Reviewed patient health questionnaire (PHQ-9) with patient for follow up. Previously, patients score indicated signs/symptoms of depression.  Reviewed to see if patient is improving symptom wise while in program.  Score improved/declined and patient states that it is because of her support system. Her husband son and family is telling her that her health is going to get better and makes her feel more positve.       Expected Outcomes Short: Continue to attend [facility] regularly for regular exercise and social engagement. Long: Continue to improve symptoms and manage a positive mental state.       Interventions Encouraged to attend Pulmonary Rehabilitation for the exercise       Continue Psychosocial Services  Follow up required by staff                Psychosocial Discharge (Final Psychosocial Re-Evaluation):  Psychosocial Re-Evaluation - 11/27/20 1343       Psychosocial Re-Evaluation   Current issues with Current Anxiety/Panic;Current Psychotropic Meds    Comments Reviewed  patient health questionnaire (PHQ-9) with patient for follow up. Previously, patients score indicated signs/symptoms of depression.  Reviewed to see if patient is improving symptom wise while in program.  Score improved/declined and patient states that it is because of her support system. Her husband son and family is telling her that her health is going to get better and makes her feel more positve.    Expected Outcomes Short: Continue to attend [facility] regularly for regular exercise and social engagement. Long: Continue to improve symptoms and manage a positive mental state.    Interventions Encouraged to attend Pulmonary Rehabilitation for the exercise    Continue Psychosocial Services  Follow up required by staff             Education: Education Goals: Education classes will be provided on a weekly basis, covering required topics. Participant will state understanding/return demonstration of topics presented.  Learning  Barriers/Preferences:   General Pulmonary Education Topics:  Infection Prevention: - Provides verbal and written material to individual with discussion of infection control including proper hand washing and proper equipment cleaning during exercise session. Flowsheet Row Pulmonary Rehab from 12/03/2020 in [facility] Cardiac and Pulmonary Rehab  Date 11/05/20  Educator [facility]  Instruction Review Code 1- Verbalizes Understanding       Falls Prevention: - Provides verbal and written material to individual with discussion of falls prevention and safety. Flowsheet Row Pulmonary Rehab from 12/03/2020 in [facility] Cardiac and Pulmonary Rehab  Date 10/29/20  Educator SB  Instruction Review Code 1- Verbalizes Understanding       Chronic Lung Disease Review: - Group verbal instruction with posters, models, PowerPoint presentations and videos,  to review new updates, new respiratory medications, new advancements in procedures and treatments. Providing information on websites and "800"  numbers for continued self-education. Includes information about supplement oxygen, available portable oxygen systems, continuous and intermittent flow rates, oxygen safety, concentrators, and Medicare reimbursement for oxygen. Explanation of Pulmonary Drugs, including class, frequency, complications, importance of spacers, rinsing mouth after steroid MDI's, and proper cleaning methods for nebulizers. Review of basic lung anatomy and physiology related to function, structure, and complications of lung disease. Review of risk factors. Discussion about methods for diagnosing sleep apnea and types of masks and machines for OSA. Includes a review of the use of types of environmental controls: home humidity, furnaces, filters, dust mite/pet prevention, HEPA vacuums. Discussion about weather changes, air quality and the benefits of nasal washing. Instruction on Warning signs, infection symptoms, calling MD promptly, preventive modes, and value of vaccinations. Review of effective airway clearance, coughing and/or vibration techniques. Emphasizing that all should Create an Action Plan. Written material given at graduation. Flowsheet Row Pulmonary Rehab from 12/03/2020 in [facility] Cardiac and Pulmonary Rehab  Education need identified 11/05/20       AED/CPR: - Group verbal and written instruction with the use of models to demonstrate the basic use of the AED with the basic ABC's of resuscitation.    Anatomy and Cardiac Procedures: - Group verbal and visual presentation and models provide information about basic cardiac anatomy and function. Reviews the testing methods done to diagnose heart disease and the outcomes of the test results. Describes the treatment choices: Medical Management, Angioplasty, or Coronary Bypass Surgery for treating various heart conditions including Myocardial Infarction, Angina, Valve Disease, and Cardiac Arrhythmias.  Written material given at graduation. Flowsheet Row Pulmonary Rehab from  12/03/2020 in [facility] Cardiac and Pulmonary Rehab  Date 11/26/20  Educator [facility]  Instruction Review Code 1- Verbalizes Understanding       Medication Safety: - Group verbal and visual instruction to review commonly prescribed medications for heart and lung disease. Reviews the medication, class of the drug, and side effects. Includes the steps to properly store meds and maintain the prescription regimen.  Written material given at graduation.   Other: -Provides group and verbal instruction on various topics (see comments)   Knowledge Questionnaire Score:  Knowledge Questionnaire Score - 11/05/20 1514       Knowledge Questionnaire Score   Pre Score 15/18  oxygen and nutrition              Core Components/Risk Factors/Patient Goals at Admission:  Personal Goals and Risk Factors at Admission - 11/05/20 1514       Core Components/Risk Factors/Patient Goals on Admission    Weight Management Yes;Obesity;Weight Loss    Intervention Weight Management: Develop a combined nutrition  and exercise program designed to reach desired caloric intake, while maintaining appropriate intake of nutrient and fiber, sodium and fats, and appropriate energy expenditure required for the weight goal.;Weight Management/Obesity: Establish reasonable short term and long term weight goals.;Weight Management: Provide education and appropriate resources to help participant work on and attain dietary goals.;Obesity: Provide education and appropriate resources to help participant work on and attain dietary goals.    Admit Weight 178 lb 8 oz (81 kg)    Goal Weight: Short Term 170 lb (77.1 kg)    Goal Weight: Long Term 165 lb (74.8 kg)    Expected Outcomes Short Term: Continue to assess and modify interventions until short term weight is achieved;Long Term: Adherence to nutrition and physical activity/exercise program aimed toward attainment of established weight goal;Weight Loss: Understanding of general recommendations  for a balanced deficit meal plan, which promotes 1-2 lb weight loss per week and includes a negative energy balance of [phone] kcal/d;Understanding recommendations for meals to include 15-35% energy as protein, 25-35% energy from fat, 35-60% energy from carbohydrates, less than $RemoveB'200mg'QgKvgAgN$  of dietary cholesterol, 20-35 gm of total fiber daily;Understanding of distribution of calorie intake throughout the day with the consumption of 4-5 meals/snacks    Improve shortness of breath with ADL's Yes    Intervention Provide education, individualized exercise plan and daily activity instruction to help decrease symptoms of SOB with activities of daily living.    Expected Outcomes Short Term: Improve cardiorespiratory fitness to achieve a reduction of symptoms when performing ADLs;Long Term: Be able to perform more ADLs without symptoms or delay the onset of symptoms    Diabetes Yes    Intervention Provide education about signs/symptoms and action to take for hypo/hyperglycemia.;Provide education about proper nutrition, including hydration, and aerobic/resistive exercise prescription along with prescribed medications to achieve blood glucose in normal ranges: Fasting glucose 65-99 mg/dL    Expected Outcomes Short Term: Participant verbalizes understanding of the signs/symptoms and immediate care of hyper/hypoglycemia, proper foot care and importance of medication, aerobic/resistive exercise and nutrition plan for blood glucose control.;Long Term: Attainment of HbA1C < 7%.    Hypertension Yes    Intervention Provide education on lifestyle modifcations including regular physical activity/exercise, weight management, moderate sodium restriction and increased consumption of fresh fruit, vegetables, and low fat dairy, alcohol moderation, and smoking cessation.;Monitor prescription use compliance.    Expected Outcomes Short Term: Continued assessment and intervention until BP is < 140/33mm HG in hypertensive participants. <  130/59mm HG in hypertensive participants with diabetes, heart failure or chronic kidney disease.;Long Term: Maintenance of blood pressure at goal levels.    Lipids Yes    Intervention Provide education and support for participant on nutrition & aerobic/resistive exercise along with prescribed medications to achieve LDL '70mg'$ , HDL >$Remo'40mg'gOiox$ .    Expected Outcomes Short Term: Participant states understanding of desired cholesterol values and is compliant with medications prescribed. Participant is following exercise prescription and nutrition guidelines.;Long Term: Cholesterol controlled with medications as prescribed, with individualized exercise RX and with personalized nutrition plan. Value goals: LDL < $Rem'70mg'MaiC$ , HDL > 40 mg.             Education:Diabetes - Individual verbal and written instruction to review signs/symptoms of diabetes, desired ranges of glucose level fasting, after meals and with exercise. Acknowledge that pre and post exercise glucose checks will be done for 3 sessions at entry of program. Flowsheet Row Pulmonary Rehab from 12/03/2020 in [facility] Cardiac and Pulmonary Rehab  Date 11/05/20  Educator [facility]  Instruction  Review Code 1- Verbalizes Understanding       Know Your Numbers and Heart Failure: - Group verbal and visual instruction to discuss disease risk factors for cardiac and pulmonary disease and treatment options.  Reviews associated critical values for Overweight/Obesity, Hypertension, Cholesterol, and Diabetes.  Discusses basics of heart failure: signs/symptoms and treatments.  Introduces Heart Failure Zone chart for action plan for heart failure.  Written material given at graduation.   Core Components/Risk Factors/Patient Goals Review:   Goals and Risk Factor Review     Row Name 11/27/20 1339             Core Components/Risk Factors/Patient Goals Review   Personal Goals Review Improve shortness of breath with ADL's       Review Spoke to patient about their shortness  of breath and what they can do to improve. Patient has been informed of breathing techniques when starting the program. Patient is informed to tell staff if they have had any med changes and that certain meds they are taking or not taking can be causing shortness of breath.       Expected Outcomes Short: Attend [facility] regularly to improve shortness of breath with ADL's. Long: maintain independence with ADL's                Core Components/Risk Factors/Patient Goals at Discharge (Final Review):   Goals and Risk Factor Review - 11/27/20 1339       Core Components/Risk Factors/Patient Goals Review   Personal Goals Review Improve shortness of breath with ADL's    Review Spoke to patient about their shortness of breath and what they can do to improve. Patient has been informed of breathing techniques when starting the program. Patient is informed to tell staff if they have had any med changes and that certain meds they are taking or not taking can be causing shortness of breath.    Expected Outcomes Short: Attend [facility] regularly to improve shortness of breath with ADL's. Long: maintain independence with ADL's             ITP Comments:  ITP Comments     Row Name 10/29/20 1620 11/05/20 1507 11/10/20 1429 11/12/20 0743 12/10/20 0733   ITP Comments Virtual orientation call completed today. shehas an appointment on Date: 11/05/2020 for EP eval and gym Orientation.  Documentation of diagnosis [facility] be found in [facility] Date: 10/01/2020. Completed 6MWT and gym orientation. Initial ITP created and sent for review to Dr. Zetta Bills, Medical Director. First full day of exercise!  Patient was oriented to gym and equipment including functions, settings, policies, and procedures.  Patient's individual exercise prescription and treatment plan were reviewed.  All starting workloads were established based on the results of the 6 minute walk test done at initial orientation visit.  The plan for exercise  progression was also introduced and progression will be customized based on patient's performance and goals. 30 Day review completed. Medical Director ITP review done, changes made as directed, and signed approval by Medical Director.  New to program 30 Day review completed. Medical Director ITP review done, changes made as directed, and signed approval by Medical Director.            Comments:

## 2020-12-10 NOTE — Progress Notes (Signed)
Daily Session Note  Patient Details  Name: Dorothy Rogers MRN: [id number] Date of Birth: 06-06-1950 Referring Provider:   Flowsheet Row Pulmonary Rehab from 11/05/2020 in [facility] Cardiac and Pulmonary Rehab  Referring Provider Zetta Bills MD       Encounter Date: 12/10/2020  Check In:  Session Check In - 12/10/20 1354       Check-In   Supervising physician immediately available to respond to emergencies See telemetry face sheet for immediately available ER MD    Location [facility]-Cardiac & Pulmonary Rehab    Staff Present Heath Lark, RN, BSN, CCRP;Kelly Bollinger, MPA, Nino Glow, MS, ASCM CEP, Exercise Physiologist;Melissa Caiola, RDN, LDN    Virtual Visit No    Medication changes reported     No    Fall or balance concerns reported    No    Warm-up and Cool-down Performed on first and last piece of equipment    Resistance Training Performed Yes    VAD Patient? No    PAD/SET Patient? No      Pain Assessment   Currently in Pain? No/denies                Social History   Tobacco Use  Smoking Status Never  Smokeless Tobacco Never    Goals Met:  Proper associated with RPD/PD & O2 Sat Independence with exercise equipment Exercise tolerated well No report of cardiac concerns or symptoms  Goals Unmet:  Not Applicable  Comments: Pt able to follow exercise prescription today without complaint.  Will continue to monitor for progression.    Dr. Emily Filbert is Medical Director for [facility].  Dr. Ottie Glazier is Medical Director for [facility] Pulmonary Rehabilitation.

## 2020-12-11 ENCOUNTER — Encounter: Payer: Medicare Other | Admitting: *Deleted

## 2020-12-11 ENCOUNTER — Other Ambulatory Visit: Payer: Self-pay

## 2020-12-11 DIAGNOSIS — I272 Pulmonary hypertension, unspecified: Secondary | ICD-10-CM

## 2020-12-11 NOTE — Progress Notes (Signed)
Daily Session Note  Patient Details  Name: Dorothy Rogers MRN: [id number] Date of Birth: Jul 15, 1950 Referring Provider:   Flowsheet Row Pulmonary Rehab from 11/05/2020 in [facility] Cardiac and Pulmonary Rehab  Referring Provider Zetta Bills MD       Encounter Date: 12/11/2020  Check In:  Session Check In - 12/11/20 1330       Check-In   Supervising physician immediately available to respond to emergencies See telemetry face sheet for immediately available ER MD    Location [facility]-Cardiac & Pulmonary Rehab    Staff Present Renita Papa, RN BSN;Melissa Olivet, RDN, LDN;Joseph Dennison, RCP,RRT,BSRT    Virtual Visit No    Medication changes reported     No    Fall or balance concerns reported    No    Warm-up and Cool-down Performed on first and last piece of equipment    Resistance Training Performed Yes    VAD Patient? No    PAD/SET Patient? No      Pain Assessment   Currently in Pain? No/denies                Social History   Tobacco Use  Smoking Status Never  Smokeless Tobacco Never    Goals Met:  Independence with exercise equipment Exercise tolerated well No report of cardiac concerns or symptoms Strength training completed today  Goals Unmet:  Not Applicable  Comments: Pt able to follow exercise prescription today without complaint.  Will continue to monitor for progression.    Dr. Emily Filbert is Medical Director for [place].  Dr. Ottie Glazier is Medical Director for [facility] Pulmonary Rehabilitation.

## 2020-12-15 ENCOUNTER — Other Ambulatory Visit: Payer: Self-pay

## 2020-12-15 DIAGNOSIS — I272 Pulmonary hypertension, unspecified: Secondary | ICD-10-CM

## 2020-12-15 NOTE — Progress Notes (Signed)
Daily Session Note  Patient Details  Name: Dorothy Rogers MRN: [id number] Date of Birth: 23-Apr-1951 Referring Provider:   Flowsheet Row Pulmonary Rehab from 11/05/2020 in [facility] Cardiac and Pulmonary Rehab  Referring Provider Zetta Bills MD       Encounter Date: 12/15/2020  Check In:  Session Check In - 12/15/20 1409       Check-In   Supervising physician immediately available to respond to emergencies See telemetry face sheet for immediately available ER MD    Location [facility]-Cardiac & Pulmonary Rehab    Staff Present Birdie Sons, MPA, Mauricia Area, BS, ACSM CEP, Exercise Physiologist;Joseph Tessie Fass, [place]    Virtual Visit No    Medication changes reported     No    Fall or balance concerns reported    No    Warm-up and Cool-down Performed on first and last piece of equipment    Resistance Training Performed Yes    VAD Patient? No    PAD/SET Patient? No      Pain Assessment   Currently in Pain? No/denies                Social History   Tobacco Use  Smoking Status Never  Smokeless Tobacco Never    Goals Met:  Independence with exercise equipment Exercise tolerated well No report of cardiac concerns or symptoms Strength training completed today  Goals Unmet:  Not Applicable  Comments: Pt able to follow exercise prescription today without complaint.  Will continue to monitor for progression.    Dr. Emily Filbert is Medical Director for [place].  Dr. Ottie Glazier is Medical Director for [facility] Pulmonary Rehabilitation.

## 2020-12-17 ENCOUNTER — Other Ambulatory Visit: Payer: Self-pay

## 2020-12-17 DIAGNOSIS — I272 Pulmonary hypertension, unspecified: Secondary | ICD-10-CM

## 2020-12-17 NOTE — Progress Notes (Signed)
Daily Session Note  Patient Details  Name: Dorothy Rogers MRN: [id number] Date of Birth: Feb 06, 1951 Referring Provider:   Flowsheet Row Pulmonary Rehab from 11/05/2020 in [facility] Cardiac and Pulmonary Rehab  Referring Provider Zetta Bills MD       Encounter Date: 12/17/2020  Check In:  Session Check In - 12/17/20 1328       Check-In   Supervising physician immediately available to respond to emergencies See telemetry face sheet for immediately available ER MD    Location [facility]-Cardiac & Pulmonary Rehab    Staff Present Birdie Sons, MPA, Nino Glow, MS, ASCM CEP, Exercise Physiologist;Joseph Tessie Fass, [place]    Virtual Visit No    Medication changes reported     No    Fall or balance concerns reported    No    Warm-up and Cool-down Performed on first and last piece of equipment    Resistance Training Performed Yes    VAD Patient? No    PAD/SET Patient? No      Pain Assessment   Currently in Pain? No/denies                Social History   Tobacco Use  Smoking Status Never  Smokeless Tobacco Never    Goals Met:  Independence with exercise equipment Exercise tolerated well No report of cardiac concerns or symptoms Strength training completed today  Goals Unmet:  Not Applicable  Comments: Pt able to follow exercise prescription today without complaint.  Will continue to monitor for progression.    Dr. Emily Filbert is Medical Director for [place].  Dr. Ottie Glazier is Medical Director for [facility] Pulmonary Rehabilitation.

## 2020-12-18 ENCOUNTER — Other Ambulatory Visit: Payer: Self-pay

## 2020-12-18 ENCOUNTER — Encounter: Payer: Medicare Other | Admitting: *Deleted

## 2020-12-18 DIAGNOSIS — I272 Pulmonary hypertension, unspecified: Secondary | ICD-10-CM | POA: Diagnosis not present

## 2020-12-18 NOTE — Progress Notes (Signed)
Daily Session Note  Patient Details  Name: Dorothy Rogers MRN: [id number] Date of Birth: Oct 01, 1950 Referring Provider:   Flowsheet Row Pulmonary Rehab from 11/05/2020 in [facility] Cardiac and Pulmonary Rehab  Referring Provider Zetta Bills MD       Encounter Date: 12/18/2020  Check In:  Session Check In - 12/18/20 1331       Check-In   Supervising physician immediately available to respond to emergencies See telemetry face sheet for immediately available ER MD    Location [facility]-Cardiac & Pulmonary Rehab    Staff Present Renita Papa, RN BSN;Joseph [place], RCP,RRT,BSRT;Jessica Raymond, [place], RCEP, CCRP, CCET    Virtual Visit No    Medication changes reported     No    Fall or balance concerns reported    No    Warm-up and Cool-down Performed on first and last piece of equipment    Resistance Training Performed Yes    VAD Patient? No    PAD/SET Patient? No      Pain Assessment   Currently in Pain? No/denies                Social History   Tobacco Use  Smoking Status Never  Smokeless Tobacco Never    Goals Met:  Independence with exercise equipment Exercise tolerated well No report of cardiac concerns or symptoms Strength training completed today  Goals Unmet:  Not Applicable  Comments: Pt able to follow exercise prescription today without complaint.  Will continue to monitor for progression.    Dr. Emily Filbert is Medical Director for [place].  Dr. Ottie Glazier is Medical Director for [facility] Pulmonary Rehabilitation.

## 2020-12-24 ENCOUNTER — Other Ambulatory Visit: Payer: Self-pay

## 2020-12-24 DIAGNOSIS — I272 Pulmonary hypertension, unspecified: Secondary | ICD-10-CM | POA: Diagnosis not present

## 2020-12-24 NOTE — Progress Notes (Signed)
Daily Session Note  Patient Details  Name: Dorothy Rogers MRN: [id number] Date of Birth: 1950/12/17 Referring Provider:   Flowsheet Row Pulmonary Rehab from 11/05/2020 in [facility] Cardiac and Pulmonary Rehab  Referring Provider Zetta Bills MD       Encounter Date: 12/24/2020  Check In:  Session Check In - 12/24/20 1328       Check-In   Supervising physician immediately available to respond to emergencies See telemetry face sheet for immediately available ER MD    Location [facility]-Cardiac & Pulmonary Rehab    Staff Present Birdie Sons, MPA, Nino Glow, MS, ASCM CEP, Exercise Physiologist;Joseph Tessie Fass, [place]    Virtual Visit No    Medication changes reported     No    Fall or balance concerns reported    No    Warm-up and Cool-down Performed on first and last piece of equipment    Resistance Training Performed Yes    VAD Patient? No    PAD/SET Patient? No      Pain Assessment   Currently in Pain? No/denies                Social History   Tobacco Use  Smoking Status Never  Smokeless Tobacco Never    Goals Met:  Independence with exercise equipment Exercise tolerated well No report of cardiac concerns or symptoms Strength training completed today  Goals Unmet:  Not Applicable  Comments: Pt able to follow exercise prescription today without complaint.  Will continue to monitor for progression.    Dr. Emily Filbert is Medical Director for [place].  Dr. Ottie Glazier is Medical Director for [facility] Pulmonary Rehabilitation.

## 2020-12-25 ENCOUNTER — Other Ambulatory Visit: Payer: Self-pay

## 2020-12-25 ENCOUNTER — Encounter: Payer: Medicare Other | Admitting: *Deleted

## 2020-12-25 DIAGNOSIS — I272 Pulmonary hypertension, unspecified: Secondary | ICD-10-CM | POA: Diagnosis not present

## 2020-12-25 NOTE — Progress Notes (Signed)
Daily Session Note  Patient Details  Name: CHENELLE BENNING MRN: [id number] Date of Birth: 10/03/1950 Referring Provider:   Flowsheet Row Pulmonary Rehab from 11/05/2020 in [facility] Cardiac and Pulmonary Rehab  Referring Provider Zetta Bills MD       Encounter Date: 12/25/2020  Check In:  Session Check In - 12/25/20 1332       Check-In   Supervising physician immediately available to respond to emergencies See telemetry face sheet for immediately available ER MD    Location [facility]-Cardiac & Pulmonary Rehab    Staff Present Renita Papa, RN BSN;Joseph Tessie Fass, RCP,RRT,BSRT;Melissa [place], [place], LDN    Virtual Visit No    Medication changes reported     No    Fall or balance concerns reported    No    Warm-up and Cool-down Performed on first and last piece of equipment    Resistance Training Performed Yes    VAD Patient? No    PAD/SET Patient? No      Pain Assessment   Currently in Pain? No/denies                Social History   Tobacco Use  Smoking Status Never  Smokeless Tobacco Never    Goals Met:  Independence with exercise equipment Exercise tolerated well No report of cardiac concerns or symptoms Strength training completed today  Goals Unmet:  Not Applicable  Comments: Pt able to follow exercise prescription today without complaint.  Will continue to monitor for progression.    Dr. Emily Filbert is Medical Director for [place].  Dr. Ottie Glazier is Medical Director for [facility] Pulmonary Rehabilitation.

## 2020-12-29 ENCOUNTER — Other Ambulatory Visit: Payer: Self-pay

## 2020-12-29 ENCOUNTER — Encounter: Payer: Medicare Other | Attending: Pulmonary Disease

## 2020-12-29 DIAGNOSIS — I272 Pulmonary hypertension, unspecified: Secondary | ICD-10-CM | POA: Insufficient documentation

## 2020-12-29 NOTE — Progress Notes (Signed)
Daily Session Note  Patient Details  Name: Dorothy Rogers MRN: [id number] Date of Birth: [birth year] Referring Provider:   Flowsheet Row Pulmonary Rehab from 11/05/2020 in [facility] Cardiac and Pulmonary Rehab  Referring Provider Zetta Bills MD       Encounter Date: 12/29/2020  Check In:  Session Check In - 12/29/20 1332       Check-In   Supervising physician immediately available to respond to emergencies See telemetry face sheet for immediately available ER MD    Location [facility]-Cardiac & Pulmonary Rehab    Staff Present Birdie Sons, MPA, Nino Glow, MS, ASCM CEP, Exercise Physiologist;Meredith Sherryll Burger, RN BSN    Virtual Visit No    Medication changes reported     No    Fall or balance concerns reported    No    Warm-up and Cool-down Performed on first and last piece of equipment    Resistance Training Performed Yes    VAD Patient? No    PAD/SET Patient? No      Pain Assessment   Currently in Pain? No/denies                Social History   Tobacco Use  Smoking Status Never  Smokeless Tobacco Never    Goals Met:  Independence with exercise equipment Exercise tolerated well No report of cardiac concerns or symptoms Strength training completed today  Goals Unmet:  Not Applicable  Comments: Pt able to follow exercise prescription today without complaint.  Will continue to monitor for progression.    Dr. Emily Filbert is Medical Director for [place].  Dr. Ottie Glazier is Medical Director for [facility] Pulmonary Rehabilitation.

## 2020-12-31 ENCOUNTER — Encounter: Payer: Medicare Other | Admitting: *Deleted

## 2020-12-31 ENCOUNTER — Other Ambulatory Visit: Payer: Self-pay

## 2020-12-31 DIAGNOSIS — I272 Pulmonary hypertension, unspecified: Secondary | ICD-10-CM

## 2020-12-31 NOTE — Progress Notes (Signed)
Daily Session Note  Patient Details  Name: Dorothy Rogers MRN: [id number] Date of Birth: [birth year] Referring Provider:   Flowsheet Row Pulmonary Rehab from 11/05/2020 in [facility] Cardiac and Pulmonary Rehab  Referring Provider Zetta Bills MD       Encounter Date: 12/31/2020  Check In:  Session Check In - 12/31/20 1551       Check-In   Supervising physician immediately available to respond to emergencies See telemetry face sheet for immediately available ER MD    Location [facility]-Cardiac & Pulmonary Rehab    Staff Present Will Bonnet, RN,BC,MSN;Kara Eliezer Bottom, MS, ASCM CEP, Exercise Physiologist;Melissa [place], RDN, LDN    Virtual Visit No    Medication changes reported     No    Fall or balance concerns reported    No    Warm-up and Cool-down Performed on first and last piece of equipment    Resistance Training Performed Yes    VAD Patient? No    PAD/SET Patient? No      Pain Assessment   Currently in Pain? No/denies                Social History   Tobacco Use  Smoking Status Never  Smokeless Tobacco Never    Goals Met:  Independence with exercise equipment Exercise tolerated well No report of cardiac concerns or symptoms Strength training completed today  Goals Unmet:  Not Applicable  Comments: Pt able to follow exercise prescription today without complaint.  Will continue to monitor for progression.    Dr. Emily Filbert is Medical Director for [place].  Dr. Ottie Glazier is Medical Director for [facility] Pulmonary Rehabilitation.

## 2021-01-01 ENCOUNTER — Encounter: Payer: Medicare Other | Admitting: *Deleted

## 2021-01-01 ENCOUNTER — Other Ambulatory Visit: Payer: Self-pay

## 2021-01-01 DIAGNOSIS — I272 Pulmonary hypertension, unspecified: Secondary | ICD-10-CM

## 2021-01-01 NOTE — Progress Notes (Signed)
Daily Session Note  Patient Details  Name: Dorothy Rogers MRN: [id number] Date of Birth: 07/21/1950 Referring Provider:   Flowsheet Row Pulmonary Rehab from 11/05/2020 in [facility] Cardiac and Pulmonary Rehab  Referring Provider Aleskerov, Faud MD       Encounter Date: 01/01/2021  Check In:  Session Check In - 01/01/21 1329       Check-In   Supervising physician immediately available to respond to emergencies See telemetry face sheet for immediately available ER MD    Location [facility]-Cardiac & Pulmonary Rehab    Staff Present Meredith Craven, RN BSN;Joseph Hood, RCP,RRT,BSRT;Jessica Hawkins, MA, RCEP, CCRP, CCET    Virtual Visit No    Medication changes reported     No    Fall or balance concerns reported    No    Warm-up and Cool-down Performed on first and last piece of equipment    Resistance Training Performed Yes    VAD Patient? No    PAD/SET Patient? No      Pain Assessment   Currently in Pain? No/denies                Social History   Tobacco Use  Smoking Status Never  Smokeless Tobacco Never    Goals Met:  Independence with exercise equipment Exercise tolerated well No report of cardiac concerns or symptoms Strength training completed today  Goals Unmet:  Not Applicable  Comments: Pt able to follow exercise prescription today without complaint.  Will continue to monitor for progression.    Dr. Mark Miller is Medical Director for [facility] Cardiac Rehabilitation.  Dr. Fuad Aleskerov is Medical Director for [facility] Pulmonary Rehabilitation. 

## 2021-01-05 ENCOUNTER — Encounter: Payer: Self-pay | Admitting: *Deleted

## 2021-01-05 DIAGNOSIS — I272 Pulmonary hypertension, unspecified: Secondary | ICD-10-CM

## 2021-01-07 ENCOUNTER — Encounter: Payer: Self-pay | Admitting: *Deleted

## 2021-01-07 DIAGNOSIS — I272 Pulmonary hypertension, unspecified: Secondary | ICD-10-CM

## 2021-01-07 NOTE — Progress Notes (Signed)
Pulmonary Individual Treatment Plan  Patient Details  Name: Dorothy Rogers MRN: [id number] Date of Birth: [birth year] Referring Provider:   Flowsheet Row Pulmonary Rehab from 11/05/2020 in [facility] Cardiac and Pulmonary Rehab  Referring Provider Zetta Bills MD       Initial Encounter Date:  Flowsheet Row Pulmonary Rehab from 11/05/2020 in [facility] Cardiac and Pulmonary Rehab  Date 11/05/20       Visit Diagnosis: Pulmonary hypertension ([place])  Patient's Home Medications on Admission:  Current Outpatient Medications:    acetaminophen (TYLENOL) 500 MG tablet, Take 1,000 mg by mouth every 6 (six) hours as needed for mild pain or moderate pain. , Disp: , Rfl:    acyclovir (ZOVIRAX) 400 MG tablet, Take 400 mg by mouth daily as needed (for fever blisters). , Disp: , Rfl:    aspirin EC 81 MG EC tablet, Take 1 tablet (81 mg total) by mouth daily. (Patient taking differently: Take 81 mg by mouth at bedtime.), Disp: , Rfl:    atorvastatin (LIPITOR) 40 MG tablet, Take 1 tablet by mouth once daily, Disp: , Rfl:    Biotin 10 MG TABS, Take by mouth., Disp: , Rfl:    Calcium Carb-Cholecalciferol (CALTRATE 600+D3 PO), Take 1 tablet by mouth daily., Disp: , Rfl:    Calcium Carbonate-Vitamin D 600-400 MG-UNIT tablet, Take by mouth. (Patient not taking: Reported on 10/29/2020), Disp: , Rfl:    CARTIA XT 120 MG 24 hr capsule, Take 120 mg by mouth daily., Disp: , Rfl: 3   Cholecalciferol (VITAMIN D-3) 125 MCG (5000 UT) TABS, Take 5,000 Units by mouth daily., Disp: , Rfl:    clopidogrel (PLAVIX) 75 MG tablet, Take 1 tablet by mouth once daily, Disp: , Rfl:    conjugated estrogens (PREMARIN) vaginal cream, Place 0.5 g vaginally 2 (two) times a week. (Patient not taking: Reported on 10/29/2020), Disp: , Rfl:    Cyanocobalamin (VITAMIN B-12) 5000 MCG SUBL, Place 5,000 mcg under the tongue daily., Disp: , Rfl:    diazepam (VALIUM) 2 MG tablet, Take by mouth., Disp: , Rfl:    ferrous sulfate 325 (65 FE) MG tablet, Take  325 mg by mouth daily with breakfast., Disp: , Rfl:    gabapentin (NEURONTIN) 100 MG capsule, Take by mouth., Disp: , Rfl:    glipiZIDE (GLUCOTROL) 10 MG tablet, Take 10 mg by mouth 2 (two) times daily. Before breakfast & before supper, Disp: , Rfl:    ibuprofen (ADVIL) 200 MG tablet, Take 400 mg by mouth every 8 (eight) hours as needed (for pain.)., Disp: , Rfl:    insulin glargine (LANTUS SOLOSTAR) 100 UNIT/ML Solostar Pen, INJECT 36 TO 50 UNITS SUBCUTANEOUSLY ONCE DAILY AS DIRECTED, Disp: , Rfl:    insulin lispro (HUMALOG) 100 UNIT/ML injection, Inject into the skin 3 (three) times daily before meals. Sliding scale, Disp: , Rfl:    loperamide (IMODIUM) 2 MG capsule, Take by mouth., Disp: , Rfl:    losartan (COZAAR) 100 MG tablet, Take 1 tablet by mouth once daily, Disp: , Rfl:    melatonin 1 MG TABS tablet, Take by mouth. (Patient not taking: Reported on 10/29/2020), Disp: , Rfl:    ondansetron (ZOFRAN-ODT) 8 MG disintegrating tablet, Take 8 mg by mouth as needed for nausea or vomiting., Disp: , Rfl:    pantoprazole (PROTONIX) 20 MG tablet, Take by mouth., Disp: , Rfl:    Probiotic CAPS, Take 1 capsule by mouth daily., Disp: , Rfl:    promethazine (PHENERGAN) 12.5 MG tablet, Take 1  tablet (12.5 mg total) by mouth every 6 (six) hours as needed for nausea or vomiting., Disp: 15 tablet, Rfl: 0 No current facility-administered medications for this visit.  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   Cataract    Chronic kidney disease    Stage III   Coronary artery disease    DDD (degenerative disc  disease), lumbar    Diabetes mellitus without complication ([place])    TYPE 2   Herpes simplex    Hyperlipidemia    Hypertension    CONTROLLED ON MEDS   Vertigo    occasional, no episodes in 2-3 months   Vertigo    OCCASIONALLY   Vitamin B 12 deficiency     Tobacco Use: Social History   Tobacco Use  Smoking Status Never  Smokeless Tobacco Never    Labs: Recent Review Flowsheet Data     Labs for ITP Cardiac and Pulmonary Rehab Latest Ref Rng & Units 06/09/2015 12/01/2015   Hemoglobin A1c 4.0 - 6.0 % 6.5(H) -   PHART 7.350 - 7.450 - 7.67(HH)   PCO2ART 32.0 - 48.0 mmHg - 17(LL)        Pulmonary Assessment Scores:  Pulmonary Assessment Scores     Row Name 10/29/20 1609 11/05/20 1517       ADL UCSD   ADL Phase Entry Entry    SOB Score total 88 88    Rest 2 2    Walk 4 4    Stairs 4 4    Bath 5 5    Dress 4 4    Shop 4 4         CAT Score      CAT Score 15 15         mMRC Score      mMRC Score -- 4            UCSD: Self-administered rating of dyspnea associated with activities of daily living (ADLs) 6-point scale (0 = "not at all" to 5 = "maximal or unable to do because of breathlessness")  Scoring Scores range from 0 to 120.  Minimally important difference is 5 units  CAT: CAT can identify the health impairment of COPD patients and is better correlated with disease progression.  CAT has a scoring range of zero to 40. The CAT score is classified into four groups of low (less than 10), medium (10 - 20), high (21-30) and very high (31-40) based on the impact level of disease on health status. A CAT score over 10 suggests significant symptoms.  A worsening CAT score could be explained by an exacerbation, poor medication adherence, poor inhaler technique, or progression of COPD or comorbid conditions.  CAT MCID is 2 points  mMRC: mMRC (Modified Medical Research Council) Dyspnea Scale is used to assess the degree of baseline functional disability in patients of  respiratory disease due to dyspnea. No minimal important difference is established. A decrease in score of 1 point or greater is considered a positive change.   Pulmonary Function Assessment:  Pulmonary Function Assessment - 11/05/20 1516       Breath   Shortness of Breath Yes;Panic with Shortness of Breath;Fear of Shortness of Breath;Limiting activity             Exercise Target Goals: Exercise Program Goal: Individual exercise prescription set using results from initial 6 min walk test and THRR while considering  patient's activity barriers and safety.   Exercise Prescription Goal: Initial exercise prescription builds to 30-45 minutes a day of aerobic activity, 2-3 days per week.  Home exercise guidelines will be given to patient during program as part of exercise prescription that the participant will acknowledge.  Education: Aerobic Exercise: - Group verbal and visual presentation on the components of exercise prescription. Introduces F.I.T.T principle from ACSM for exercise prescriptions.  Reviews F.I.T.T. principles of aerobic exercise including progression. Written material given at graduation.   Education: Resistance Exercise: - Group verbal and visual presentation on the components of exercise prescription. Introduces F.I.T.T principle from ACSM for exercise prescriptions  Reviews F.I.T.T. principles of resistance exercise including progression. Written material given at graduation. Flowsheet Row Pulmonary Rehab from 12/31/2020 in [facility] Cardiac and Pulmonary Rehab  Date 11/26/20  Educator [place]  Instruction Review Code 1- Verbalizes Understanding        Education: Exercise & Equipment Safety: - Individual verbal instruction and demonstration of equipment use and safety with use of the equipment. Flowsheet Row Pulmonary Rehab from 12/31/2020 in [facility] Cardiac and Pulmonary Rehab  Date 11/05/20  Educator [facility]  Instruction Review Code 1- Verbalizes Understanding        Education: Exercise Physiology & General Exercise Guidelines: - Group verbal and written instruction with models to review the exercise physiology of the cardiovascular system and associated critical values. Provides general exercise guidelines with specific guidelines to those with heart or lung disease.  Flowsheet Row Pulmonary Rehab from 12/31/2020 in [facility] Cardiac and Pulmonary Rehab  Date 11/12/20  Educator [facility]  Instruction Review Code 1- Verbalizes Understanding       Education: Flexibility, Balance, Mind/Body Relaxation: - Group verbal and visual presentation with interactive activity on the components of exercise prescription. Introduces F.I.T.T principle from ACSM for exercise prescriptions. Reviews F.I.T.T. principles of flexibility and balance exercise training including progression. Also discusses the mind body connection.  Reviews various relaxation techniques to help reduce and manage stress (i.e. Deep breathing, progressive muscle relaxation, and visualization). Balance handout provided to take home. Written material given at graduation. Flowsheet Row Pulmonary Rehab from 12/31/2020 in [facility] Cardiac and Pulmonary Rehab  Date 12/03/20  Educator [facility]  Instruction Review Code 1- Verbalizes Understanding       Activity Barriers & Risk Stratification:  Activity Barriers & Cardiac Risk Stratification - 11/05/20 1510       Activity Barriers & Cardiac Risk Stratification   Activity Barriers Arthritis;Back Problems;Neck/Spine Problems;Muscular Weakness;Deconditioning;Balance Concerns;Shortness of Breath;Assistive Device;Joint Problems             6 Minute Walk:  6 Minute Walk     Row Name 11/05/20 1508         6 Minute Walk   Phase Initial     Distance 296 feet     Walk Time 4.88 minutes     # of Rest Breaks 5  10 sec, 17 sec, 12 sec, 10 sec, stopped at 5:32     MPH 0.67     METS 1.24     RPE 15     Perceived Dyspnea  3  VO2 Peak 4.35     Symptoms Yes (comment)      Comments SOB, chronic hip pain 3/10     Resting HR 95 bpm     Resting BP 132/64     Resting Oxygen Saturation  98 %     Exercise Oxygen Saturation  during 6 min walk 98 %     Max Ex. HR 128 bpm     Max Ex. BP 146/72     2 Minute Post BP 130/64           Interval HR     1 Minute HR 127     2 Minute HR 128     3 Minute HR 127     4 Minute HR 123     5 Minute HR 124     6 Minute HR 113     2 Minute Post HR 106     Interval Heart Rate? Yes           Interval Oxygen     Interval Oxygen? Yes     Baseline Oxygen Saturation % 98 %     1 Minute Oxygen Saturation % 98 %     1 Minute Liters of Oxygen 0 L  Room Air     2 Minute Oxygen Saturation % 99 %     2 Minute Liters of Oxygen 0 L     3 Minute Oxygen Saturation % 99 %     3 Minute Liters of Oxygen 0 L     4 Minute Oxygen Saturation % 98 %     4 Minute Liters of Oxygen 0 L     5 Minute Oxygen Saturation % 98 %     5 Minute Liters of Oxygen 0 L     6 Minute Oxygen Saturation % 98 %     6 Minute Liters of Oxygen 0 L     2 Minute Post Oxygen Saturation % 98 %     2 Minute Post Liters of Oxygen 0 L            Oxygen Initial Assessment:  Oxygen Initial Assessment - 11/05/20 1516       Home Oxygen   Home Oxygen Device None    Sleep Oxygen Prescription None    Home Exercise Oxygen Prescription None    Home Resting Oxygen Prescription None    Compliance with Home Oxygen Use Yes      Initial 6 min Walk   Oxygen Used None      Program Oxygen Prescription   Program Oxygen Prescription None      Intervention   Short Term Goals To learn and understand importance of monitoring SPO2 with pulse oximeter and demonstrate accurate use of the pulse oximeter.;To learn and understand importance of maintaining oxygen saturations>88%;To learn and demonstrate proper pursed lip breathing techniques or other breathing techniques. ;To learn and demonstrate proper use of respiratory medications    Long  Term Goals Verbalizes importance  of monitoring SPO2 with pulse oximeter and return demonstration;Maintenance of O2 saturations>88%;Exhibits proper breathing techniques, such as pursed lip breathing or other method taught during program session;Compliance with respiratory medication;Demonstrates proper use of MDI's             Oxygen Re-Evaluation:  Oxygen Re-Evaluation     Row Name 11/27/20 1337 12/17/20 1354           Program Oxygen Prescription   Program Oxygen Prescription None None  Home Oxygen      Home Oxygen Device None None      Sleep Oxygen Prescription None None      Home Exercise Oxygen Prescription None None      Home Resting Oxygen Prescription None None      Compliance with Home Oxygen Use Yes Yes             Goals/Expected Outcomes      Short Term Goals To learn and demonstrate proper pursed lip breathing techniques or other breathing techniques.  To learn and demonstrate proper pursed lip breathing techniques or other breathing techniques.       Long  Term Goals Exhibits proper breathing techniques, such as pursed lip breathing or other method taught during program session Exhibits proper breathing techniques, such as pursed lip breathing or other method taught during program session      Comments Informed patient how to perform the Pursed Lipped breathing technique. Told patient to Inhale through the nose and out the mouth with pursed lips to keep their airways open, help oxygenate them better, practice when at rest or doing strenuous activity. Patient Verbalizes understanding of technique and will work on and be reiterated during [place]. Practice pursed lip breathing- she keeps forgetting to use it and it feels unnatural. She does not wear a pulse ox at home to check o2 but is interested in getting one. She feels her shortness of breath has been about the same.      Goals/Expected Outcomes Short: use PLB with exertion. Long: use PLB on exertion proficiently and independently. Short:  use PLB with exertion, get pulse ox. Long: use PLB on exertion proficiently and independently.              Oxygen Discharge (Final Oxygen Re-Evaluation):  Oxygen Re-Evaluation - 12/17/20 1354       Program Oxygen Prescription   Program Oxygen Prescription None      Home Oxygen   Home Oxygen Device None    Sleep Oxygen Prescription None    Home Exercise Oxygen Prescription None    Home Resting Oxygen Prescription None    Compliance with Home Oxygen Use Yes      Goals/Expected Outcomes   Short Term Goals To learn and demonstrate proper pursed lip breathing techniques or other breathing techniques.     Long  Term Goals Exhibits proper breathing techniques, such as pursed lip breathing or other method taught during program session    Comments Practice pursed lip breathing- she keeps forgetting to use it and it feels unnatural. She does not wear a pulse ox at home to check o2 but is interested in getting one. She feels her shortness of breath has been about the same.    Goals/Expected Outcomes Short: use PLB with exertion, get pulse ox. Long: use PLB on exertion proficiently and independently.             Initial Exercise Prescription:  Initial Exercise Prescription - 11/05/20 1500       Date of Initial Exercise RX and Referring Provider   Date 11/05/20    Referring Provider Zetta Bills MD      Recumbant Bike   Level 1    RPM 50    Watts 5    Minutes 15    METs 2      NuStep   Level 1    SPM 80    Minutes 15    METs 2      REL-XR  Level 1    Speed 50    Minutes 15    METs 2      Track   Laps 8    Minutes 15    METs 1.4      Prescription Details   Frequency (times per week) 3    Duration Progress to 30 minutes of continuous aerobic without signs/symptoms of physical distress      Intensity   THRR 40-80% of Max Heartrate 117-139    Ratings of Perceived Exertion 11-13    Perceived Dyspnea 0-4      Progression   Progression Continue to progress  workloads to maintain intensity without signs/symptoms of physical distress.      Resistance Training   Training Prescription Yes    Weight 3 lb    Reps 10-15             Perform Capillary Blood Glucose checks as needed.  Exercise Prescription Changes:   Exercise Prescription Changes     Row Name 11/05/20 1500 11/18/20 1500 12/04/20 0800 12/17/20 1400 12/18/20 1400     Response to Exercise   Blood Pressure (Admit) 132/64 132/82 130/62 120/60 --   Blood Pressure (Exercise) 146/72 142/80 174/78 -- --   Blood Pressure (Exit) 104/62 134/62 112/70 102/60 --   Heart Rate (Admit) 95 bpm 100 bpm 96 bpm 100 bpm --   Heart Rate (Exercise) 128 bpm 101 bpm 116 bpm 115 bpm --   Heart Rate (Exit) 94 bpm 106 bpm 106 bpm 105 bpm --   Oxygen Saturation (Admit) 98 % 93 % 96 % 98 % --   Oxygen Saturation (Exercise) 98 % 98 % 98 % 98 % --   Oxygen Saturation (Exit) 98 % 99 % 99 % 99 % --   Rating of Perceived Exertion (Exercise) _0 --   Perceived Dyspnea (Exercise) _1 --   Symptoms SOB, hip pain (3/10) SOB SOB SOB, back pain --   Comments walk test results -- -- -- --   Duration -- Progress to 30 minutes of  aerobic without signs/symptoms of physical distress Progress to 30 minutes of  aerobic without signs/symptoms of physical distress Continue with 30 min of aerobic exercise without signs/symptoms of physical distress. --   Intensity -- THRR unchanged THRR unchanged THRR unchanged --     Progression   Progression -- Continue to progress workloads to maintain intensity without signs/symptoms of physical distress. Continue to progress workloads to maintain intensity without signs/symptoms of physical distress. Continue to progress workloads to maintain intensity without signs/symptoms of physical distress. --   Average METs -- 1.83 2.44 2.6 --     Resistance Training   Training Prescription -- Yes Yes Yes --   Weight -- 3 lb 3 lb 3 lb --   Reps -- 10-15 10-15 10-15 --      Interval Training   Interval Training -- No No No --     NuStep   Level -- 1 -- -- --   Minutes -- 15 -- -- --   METs -- 1.8 -- -- --     REL-XR   Level -- _2 --   Minutes -- _3 --   METs -- 1.9 3.2 3.3 --     T5 Nustep   Level -- _4 --   Minutes -- _5 --   METs -- 1.8 1.8 1.8 --     Track  Laps -- -- 4 -- --   Minutes -- -- 15 -- --   METs -- -- 1.2 -- --     Home Exercise Plan   Plans to continue exercise at -- -- -- -- Home (comment)  Recumbant bike, [employer] staff videos   Frequency -- -- -- -- Add 3 additional days to program exercise sessions.  Start with 1   Initial Home Exercises Provided -- -- -- -- 12/18/20    Row Name 12/30/20 1300             Response to Exercise   Blood Pressure (Admit) 142/68       Blood Pressure (Exit) 124/64       Heart Rate (Admit) 108 bpm       Heart Rate (Exercise) 106 bpm       Heart Rate (Exit) 99 bpm       Oxygen Saturation (Admit) 98 %       Oxygen Saturation (Exercise) 97 %       Oxygen Saturation (Exit) 97 %       Rating of Perceived Exertion (Exercise) 14       Perceived Dyspnea (Exercise) 2       Symptoms none       Duration Continue with 30 min of aerobic exercise without signs/symptoms of physical distress.       Intensity THRR unchanged               Progression     Progression Continue to progress workloads to maintain intensity without signs/symptoms of physical distress.       Average METs 2.4               Resistance Training     Training Prescription Yes       Weight 3 lb       Reps 10-15               Interval Training     Interval Training No               T5 Nustep     Level 2       Minutes 30       METs 2.4               Exercise Comments:   Exercise Comments     Row Name 11/10/20 1429           Exercise Comments First full day of exercise!  Patient was oriented to gym and equipment including functions, settings, policies, and procedures.  Patient's individual  exercise prescription and treatment plan were reviewed.  All starting workloads were established based on the results of the 6 minute walk test done at initial orientation visit.  The plan for exercise progression was also introduced and progression will be customized based on patient's performance and goals.                Exercise Goals and Review:   Exercise Goals     Row Name 11/05/20 1512             Exercise Goals   Increase Physical Activity Yes       Intervention Provide advice, education, support and counseling about physical activity/exercise needs.;Develop an individualized exercise prescription for aerobic and resistive training based on initial evaluation findings, risk stratification, comorbidities and participant's personal goals.       Expected Outcomes Long Term: Add in home exercise to make exercise part  of routine and to increase amount of physical activity.;Short Term: Attend rehab on a regular basis to increase amount of physical activity.;Long Term: Exercising regularly at least 3-5 days a week.       Increase Strength and Stamina Yes       Intervention Provide advice, education, support and counseling about physical activity/exercise needs.;Develop an individualized exercise prescription for aerobic and resistive training based on initial evaluation findings, risk stratification, comorbidities and participant's personal goals.       Expected Outcomes Short Term: Increase workloads from initial exercise prescription for resistance, speed, and METs.;Short Term: Perform resistance training exercises routinely during rehab and add in resistance training at home;Long Term: Improve cardiorespiratory fitness, muscular endurance and strength as measured by increased METs and functional capacity (6MWT)       Able to understand and use rate of perceived exertion (RPE) scale Yes       Intervention Provide education and explanation on how to use RPE scale       Expected Outcomes  Short Term: Able to use RPE daily in rehab to express subjective intensity level;Long Term:  Able to use RPE to guide intensity level when exercising independently       Able to understand and use Dyspnea scale Yes       Intervention Provide education and explanation on how to use Dyspnea scale       Expected Outcomes Short Term: Able to use Dyspnea scale daily in rehab to express subjective sense of shortness of breath during exertion;Long Term: Able to use Dyspnea scale to guide intensity level when exercising independently       Knowledge and understanding of Target Heart Rate Range (THRR) Yes       Intervention Provide education and explanation of THRR including how the numbers were predicted and where they are located for reference       Expected Outcomes Short Term: Able to state/look up THRR;Short Term: Able to use daily as guideline for intensity in rehab;Long Term: Able to use THRR to govern intensity when exercising independently       Able to check pulse independently Yes       Intervention Provide education and demonstration on how to check pulse in carotid and radial arteries.;Review the importance of being able to check your own pulse for safety during independent exercise       Expected Outcomes Short Term: Able to explain why pulse checking is important during independent exercise;Long Term: Able to check pulse independently and accurately       Understanding of Exercise Prescription Yes       Intervention Provide education, explanation, and written materials on patient's individual exercise prescription       Expected Outcomes Short Term: Able to explain program exercise prescription;Long Term: Able to explain home exercise prescription to exercise independently                Exercise Goals Re-Evaluation :  Exercise Goals Re-Evaluation     Row Name 11/10/20 1429 11/18/20 1517 12/04/20 0833 12/17/20 1358 12/17/20 1451     Exercise Goal Re-Evaluation   Exercise Goals Review  Able to understand and use rate of perceived exertion (RPE) scale;Able to understand and use Dyspnea scale;Knowledge and understanding of Target Heart Rate Range (THRR);Understanding of Exercise Prescription Increase Physical Activity;Increase Strength and Stamina;Understanding of Exercise Prescription Increase Physical Activity;Increase Strength and Stamina;Understanding of Exercise Prescription Increase Physical Activity;Increase Strength and Stamina;Understanding of Exercise Prescription Increase Physical Activity;Increase Strength and Stamina;Understanding  of Exercise Prescription   Comments Reviewed RPE and dyspnea scales, THR and program prescription with pt today.  Pt voiced understanding and was given a copy of goals to take home. Yanelis is doing well in rehab.  She has completed her first four full exercise sessions.  We will start to increase her workloads and continue to monitor her progress. Carlyn is doing well. Her O2 sats remain in appropriate range while exercising and has now tried to walk the track with her walker in which she should build up tolerance overtime. Will continue to monitor her progress. She reports being limited due to her back and hip. She stays active around the house. EP still needs to go over home exercise. Enyah walked some today using the handrail.  She is limited by her back still.  She is up to 2.8 METs on the XR.  We will continue to monitor her progress.   Expected Outcomes Short: Use RPE daily to regulate intensity. Long: Follow program prescription in THR. Short: Increase workloads Long: Continue to improve stamina Short: Increase number of laps on track Long: Continue to increase overall MET level Short: EP to go over home exercise Long: Continue to increase overall MET level Short: Increase walking stamina Long: Continue to improve stamina    [place] Name 12/18/20 1406 12/30/20 1352           Exercise Goal Re-Evaluation   Exercise Goals Review Increase Physical  Activity;Increase Strength and Stamina;Understanding of Exercise Prescription Increase Physical Activity;Increase Strength and Stamina      Comments Reviewed home exercise with pt today.  Pt plans to use her recumbant bike and [employer] staff videos for exercise. We reviewed the [facility] and patient is highly interested in joining with her husband. Reviewed THR, pulse, RPE, sign and symptoms, pulse oximetery and when to call 911 or MD.  Also discussed weather considerations and indoor options.  Pt voiced understanding. Lindzie attends consistently but is not reaching THR range.  Staff will encourage increasing levels on seated machines.      Expected Outcomes Short: Add on 1 day of exercise at home: Continue to exercise at home independently at appropriate prescription Short: increase levels on seated machines Long:  reach THR range during exercise               Discharge Exercise Prescription (Final Exercise Prescription Changes):  Exercise Prescription Changes - 12/30/20 1300       Response to Exercise   Blood Pressure (Admit) 142/68    Blood Pressure (Exit) 124/64    Heart Rate (Admit) 108 bpm    Heart Rate (Exercise) 106 bpm    Heart Rate (Exit) 99 bpm    Oxygen Saturation (Admit) 98 %    Oxygen Saturation (Exercise) 97 %    Oxygen Saturation (Exit) 97 %    Rating of Perceived Exertion (Exercise) 14    Perceived Dyspnea (Exercise) 2    Symptoms none    Duration Continue with 30 min of aerobic exercise without signs/symptoms of physical distress.    Intensity THRR unchanged      Progression   Progression Continue to progress workloads to maintain intensity without signs/symptoms of physical distress.    Average METs 2.4      Resistance Training   Training Prescription Yes    Weight 3 lb    Reps 10-15      Interval Training   Interval Training No      T5 Nustep   Level 2  Minutes 30    METs 2.4             Nutrition:  Target Goals: Understanding of nutrition  guidelines, daily intake of sodium <1550m, cholesterol <2083m calories 30% from fat and 7% or less from saturated fats, daily to have 5 or more servings of fruits and vegetables.  Education: All About Nutrition: -Group instruction provided by verbal, written material, interactive activities, discussions, models, and posters to present general guidelines for heart healthy nutrition including fat, fiber, MyPlate, the role of sodium in heart healthy nutrition, utilization of the nutrition label, and utilization of this knowledge for meal planning. Follow up email sent as well. Written material given at graduation. Flow[facility]  Education need identified 11/05/20  Date 12/10/20  Educator MCWeidmanInstruction Review Code 1- Verbalizes Understanding       Biometrics:  Pre Biometrics - 11/05/20 1513       Pre Biometrics   Height 5' 2.5" (1.588 m)    Weight 178 lb 8 oz (81 kg)    BMI (Calculated) 32.11    Single Leg Stand 14.4 seconds              Nutrition Therapy Plan and Nutrition Goals:  Nutrition Therapy & Goals - 11/17/20 1432       Nutrition Therapy   Diet Heart healthy, low Na, diabetes friendly eating    Drug/Food Interactions Statins/Certain Fruits    Protein (specify units) 65g    Fiber 25 grams    Whole Grain Foods 3 servings    Saturated Fats 12 max. grams    Fruits and Vegetables 8 servings/day    Sodium 1.5 grams      Personal Nutrition Goals   Nutrition Goal ST: practice CHO counting at meals LT: follow MyPlate guidelines    Comments B: varies: this morning was egg and bacon and 2/3 toast (white) with some berries. With a cup of hot tea (equal). L sandwich or 1/2 sandwich (tomato, tuna, bolonga). With water. D: varies: tonight - hamburgers - will only have chips if BG is ok. Salmon cakes, chicken, pork chops. Chicken and dumplings, pizza 2x/month, quesadillas. S: cheese puffs or cheese or peanut  butter. Drinks: water. CKD stg 3. She will tak eher Lantus in AM, she also has humulog and usually does not need it. Discussed heart healthy, diabetes friendly.      Intervention Plan   Intervention Prescribe, educate and counsel regarding individualized specific dietary modifications aiming towards targeted core components such as weight, hypertension, lipid management, diabetes, heart failure and other comorbidities.    Expected Outcomes Short Term Goal: Understand basic principles of dietary content, such as calories, fat, sodium, cholesterol and nutrients.;Short Term Goal: A plan has been developed with personal nutrition goals set during dietitian appointment.;Long Term Goal: Adherence to prescribed nutrition plan.             Nutrition Assessments:  MEDIFICTS Score Key: ?70 Need to make dietary changes  40-70 Heart Healthy Diet ? 40 Therapeutic Level Cholesterol Diet  [facility]  Picture Your Plate Total Score on Admission 60      Picture Your Plate Scores: <4<40nhealthy dietary pattern with much room for improvement. 41-50 Dietary pattern unlikely to meet recommendations for good health and room for improvement. 51-60 More healthful dietary pattern, with some room for improvement.  >60 Healthy dietary pattern, although  there may be some specific behaviors that could be improved.   Nutrition Goals Re-Evaluation:  Nutrition Goals Re-Evaluation     [place] Name 12/17/20 1400             Goals   Nutrition Goal ST: practice CHO counting at meals, 2-3 CHO per meal LT: follow MyPlate guidelines       Comment She reports she is practicing CHO counting, she can not recal how much she normally has at meals due to focusing more on quality of CHO. She tries to avoid snacking. Reviewed CHO counting and complex carbohydrates. She is eating a lot of produce from her garden.       Expected Outcome ST: practice CHO  counting at meals, 2-3 CHO per meal LT: follow MyPlate guidelines                Nutrition Goals Discharge (Final Nutrition Goals Re-Evaluation):  Nutrition Goals Re-Evaluation - 12/17/20 1400       Goals   Nutrition Goal ST: practice CHO counting at meals, 2-3 CHO per meal LT: follow MyPlate guidelines    Comment She reports she is practicing CHO counting, she can not recal how much she normally has at meals due to focusing more on quality of CHO. She tries to avoid snacking. Reviewed CHO counting and complex carbohydrates. She is eating a lot of produce from her garden.    Expected Outcome ST: practice CHO counting at meals, 2-3 CHO per meal LT: follow MyPlate guidelines             Psychosocial: Target Goals: Acknowledge presence or absence of significant depression and/or stress, maximize coping skills, provide positive support system. Participant is able to verbalize types and ability to use techniques and skills needed for reducing stress and depression.   Education: Stress, Anxiety, and Depression - Group verbal and visual presentation to define topics covered.  Reviews how body is impacted by stress, anxiety, and depression.  Also discusses healthy ways to reduce stress and to treat/manage anxiety and depression.  Written material given at graduation.   Education: Sleep Hygiene -Provides group verbal and written instruction about how sleep can affect your health.  Define sleep hygiene, discuss sleep cycles and impact of sleep habits. Review good sleep hygiene tips.    Initial Review & Psychosocial Screening:  Initial Psych Review & Screening - 10/29/20 1550       Initial Review   Current issues with Current Stress Concerns;Current Sleep Concerns    Source of Stress Concerns Unable to perform yard/household activities    Comments Worries about everything. Tripp? Yes   husband and son     Barriers    Psychosocial barriers to participate in program There are no identifiable barriers or psychosocial needs.;The patient should benefit from training in stress management and relaxation.      Screening Interventions   Interventions Encouraged to exercise;To provide support and resources with identified psychosocial needs;Provide feedback about the scores to participant    Expected Outcomes Short Term goal: Utilizing psychosocial counselor, staff and physician to assist with identification of specific Stressors or current issues interfering with healing process. Setting desired goal for each stressor or current issue identified.;Long Term Goal: Stressors or current issues are controlled or eliminated.;Short Term goal: Identification and review with participant of any Quality of Life or Depression concerns found by scoring the questionnaire.;Long Term goal: The participant improves quality  of Life and PHQ9 Scores as seen by post scores and/or verbalization of changes             Quality of Life Scores:  Scores of 19 and below usually indicate a poorer quality of life in these areas.  A difference of  2-3 points is a clinically meaningful difference.  A difference of 2-3 points in the total score of the Quality of Life Index has been associated with significant improvement in overall quality of life, self-image, physical symptoms, and general health in studies assessing change in quality of life.  PHQ-9: Recent Review Flowsheet Data     Depression screen [facility] 2/9 11/27/2020 11/05/2020 01/01/2015 12/25/2014 11/27/2014   Decreased Interest 3 3 0 0 0   Down, Depressed, Hopeless 0 1 0 0 0   PHQ - 2 Score 3 4 0 0 0   Altered sleeping 1 2 - - -   Tired, decreased energy 3 3 - - -   Change in appetite 0 0 - - -   Feeling bad or failure about yourself  0 1 - - -   Trouble concentrating 0 0 - - -   Moving slowly or fidgety/restless 0 0 - - -   Suicidal thoughts 0 0 - - -   PHQ-9 Score 7 10 - - -   Difficult  doing work/chores Somewhat difficult Somewhat difficult - - -      Interpretation of Total Score  Total Score Depression Severity:  1-4 = Minimal depression, 5-9 = Mild depression, 10-14 = Moderate depression, 15-19 = Moderately severe depression, 20-27 = Severe depression   Psychosocial Evaluation and Intervention:  Psychosocial Evaluation - 10/29/20 1613       Psychosocial Evaluation & Interventions   Interventions Encouraged to exercise with the program and follow exercise prescription;Relaxation education    Comments Obelia has no barriers to attending the program. She has a great support system with her husband at home. She does have some concerns with her sleep pattern and has tried melatonin that did not help. Her stress is over the fact that she cannot participate in actitivities because of fatigue or shortness of breath stopping her. She worries and cannot always make decisions per her husband and Carly. She is looking forward to attending the program to see if she can gain some stamina and less shortness of breath with activity. She does have back and hip chronic concerns. She is being treated with injections and meds to help control the pain . She is ready to start the program.    Expected Outcomes STG Monette is able to attend all scheduled sessions and gain some stamina to be able to do more at home with less fatigue and shortness of breath. Hoping this will reduce the stress of not participating in her daily activities.  LTG: Nolyn will be able to maintain her progress from the program    Continue Psychosocial Services  Follow up required by staff             Psychosocial Re-Evaluation:  Psychosocial Re-Evaluation     [place] Name 11/27/20 1343 12/17/20 1403           Psychosocial Re-Evaluation   Current issues with Current Anxiety/Panic;Current Psychotropic Meds Current Anxiety/Panic;Current Psychotropic Meds      Comments Reviewed patient health questionnaire (PHQ-9)  with patient for follow up. Previously, patients score indicated signs/symptoms of depression.  Reviewed to see if patient is improving symptom wise while in program.  Score improved/declined and patient states that it is because of her support system. Her husband son and family is telling her that her health is going to get better and makes her feel more positve. She gets stressed due to her breathing and not being able to do the things she needs or wants to do. She has a prescription for ativan, but has not used it yet. To calm herself down she will sit and put her head down - she also talks to her husband (he is a good support system for her). She feels like she is putting too much pressure on herself, encouraged her that she is doing a good job at rehab. She reports sleeping well most night, but sometimes she will be up until 4am - sometimes she will have racing thoughts - she reports that is normal for her.      Expected Outcomes Short: Continue to attend [facility] regularly for regular exercise and social engagement. Long: Continue to improve symptoms and manage a positive mental state. Short: Continue to attend [facility] regularly for regular exercise and social engagement. Long: Continue to improve symptoms and manage a positive mental state.      Interventions Encouraged to attend Pulmonary Rehabilitation for the exercise Encouraged to attend Pulmonary Rehabilitation for the exercise      Continue Psychosocial Services  Follow up required by staff Follow up required by staff               Psychosocial Discharge (Final Psychosocial Re-Evaluation):  Psychosocial Re-Evaluation - 12/17/20 1403       Psychosocial Re-Evaluation   Current issues with Current Anxiety/Panic;Current Psychotropic Meds    Comments She gets stressed due to her breathing and not being able to do the things she needs or wants to do. She has a prescription for ativan, but has not used it yet. To calm herself down she will  sit and put her head down - she also talks to her husband (he is a good support system for her). She feels like she is putting too much pressure on herself, encouraged her that she is doing a good job at rehab. She reports sleeping well most night, but sometimes she will be up until 4am - sometimes she will have racing thoughts - she reports that is normal for her.    Expected Outcomes Short: Continue to attend [facility] regularly for regular exercise and social engagement. Long: Continue to improve symptoms and manage a positive mental state.    Interventions Encouraged to attend Pulmonary Rehabilitation for the exercise    Continue Psychosocial Services  Follow up required by staff             Education: Education Goals: Education classes will be provided on a weekly basis, covering required topics. Participant will state understanding/return demonstration of topics presented.  Learning Barriers/Preferences:   General Pulmonary Education Topics:  Infection Prevention: - Provides verbal and written material to individual with discussion of infection control including proper hand washing and proper equipment cleaning during exercise session. Flowsheet Row Pulmonary Rehab from 12/31/2020 in [facility] Cardiac and Pulmonary Rehab  Date 11/05/20  Educator [facility]  Instruction Review Code 1- Verbalizes Understanding       Falls Prevention: - Provides verbal and written material to individual with discussion of falls prevention and safety. Flowsheet Row Pulmonary Rehab from 12/31/2020 in [facility] Cardiac and Pulmonary Rehab  Date 10/29/20  Educator SB  Instruction Review Code 1- Verbalizes Understanding  Chronic Lung Disease Review: - Group verbal instruction with posters, models, PowerPoint presentations and videos,  to review new updates, new respiratory medications, new advancements in procedures and treatments. Providing information on websites and "800" numbers for continued self-education.  Includes information about supplement oxygen, available portable oxygen systems, continuous and intermittent flow rates, oxygen safety, concentrators, and Medicare reimbursement for oxygen. Explanation of Pulmonary Drugs, including class, frequency, complications, importance of spacers, rinsing mouth after steroid MDI's, and proper cleaning methods for nebulizers. Review of basic lung anatomy and physiology related to function, structure, and complications of lung disease. Review of risk factors. Discussion about methods for diagnosing sleep apnea and types of masks and machines for OSA. Includes a review of the use of types of environmental controls: home humidity, furnaces, filters, dust mite/pet prevention, HEPA vacuums. Discussion about weather changes, air quality and the benefits of nasal washing. Instruction on Warning signs, infection symptoms, calling MD promptly, preventive modes, and value of vaccinations. Review of effective airway clearance, coughing and/or vibration techniques. Emphasizing that all should Create an Action Plan. Written material given at graduation. Flowsheet Row Pulmonary Rehab from 12/31/2020 in [facility] Cardiac and Pulmonary Rehab  Education need identified 11/05/20  Date 12/31/20  Educator [facility]  Instruction Review Code 1- Verbalizes Understanding       AED/CPR: - Group verbal and written instruction with the use of models to demonstrate the basic use of the AED with the basic ABC's of resuscitation.    Anatomy and Cardiac Procedures: - Group verbal and visual presentation and models provide information about basic cardiac anatomy and function. Reviews the testing methods done to diagnose heart disease and the outcomes of the test results. Describes the treatment choices: Medical Management, Angioplasty, or Coronary Bypass Surgery for treating various heart conditions including Myocardial Infarction, Angina, Valve Disease, and Cardiac Arrhythmias.  Written material given at  graduation. Flowsheet Row Pulmonary Rehab from 12/31/2020 in [facility] Cardiac and Pulmonary Rehab  Date 11/26/20  Educator [facility]  Instruction Review Code 1- Verbalizes Understanding       Medication Safety: - Group verbal and visual instruction to review commonly prescribed medications for heart and lung disease. Reviews the medication, class of the drug, and side effects. Includes the steps to properly store meds and maintain the prescription regimen.  Written material given at graduation. Flowsheet Row Pulmonary Rehab from 12/31/2020 in [facility] Cardiac and Pulmonary Rehab  Date 12/17/20  Educator [facility]  Instruction Review Code 1- Verbalizes Understanding       Other: -Provides group and verbal instruction on various topics (see comments)   Knowledge Questionnaire Score:  Knowledge Questionnaire Score - 11/05/20 1514       Knowledge Questionnaire Score   Pre Score 15/18  oxygen and nutrition              Core Components/Risk Factors/Patient Goals at Admission:  Personal Goals and Risk Factors at Admission - 11/05/20 1514       Core Components/Risk Factors/Patient Goals on Admission    Weight Management Yes;Obesity;Weight Loss    Intervention Weight Management: Develop a combined nutrition and exercise program designed to reach desired caloric intake, while maintaining appropriate intake of nutrient and fiber, sodium and fats, and appropriate energy expenditure required for the weight goal.;Weight Management/Obesity: Establish reasonable short term and long term weight goals.;Weight Management: Provide education and appropriate resources to help participant work on and attain dietary goals.;Obesity: Provide education and appropriate resources to help participant work on and attain dietary goals.  Admit Weight 178 lb 8 oz (81 kg)    Goal Weight: Short Term 170 lb (77.1 kg)    Goal Weight: Long Term 165 lb (74.8 kg)    Expected Outcomes Short Term: Continue to assess and modify  interventions until short term weight is achieved;Long Term: Adherence to nutrition and physical activity/exercise program aimed toward attainment of established weight goal;Weight Loss: Understanding of general recommendations for a balanced deficit meal plan, which promotes 1-2 lb weight loss per week and includes a negative energy balance of [phone] kcal/d;Understanding recommendations for meals to include 15-35% energy as protein, 25-35% energy from fat, 35-60% energy from carbohydrates, less than 258m of dietary cholesterol, 20-35 gm of total fiber daily;Understanding of distribution of calorie intake throughout the day with the consumption of 4-5 meals/snacks    Improve shortness of breath with ADL's Yes    Intervention Provide education, individualized exercise plan and daily activity instruction to help decrease symptoms of SOB with activities of daily living.    Expected Outcomes Short Term: Improve cardiorespiratory fitness to achieve a reduction of symptoms when performing ADLs;Long Term: Be able to perform more ADLs without symptoms or delay the onset of symptoms    Diabetes Yes    Intervention Provide education about signs/symptoms and action to take for hypo/hyperglycemia.;Provide education about proper nutrition, including hydration, and aerobic/resistive exercise prescription along with prescribed medications to achieve blood glucose in normal ranges: Fasting glucose 65-99 mg/dL    Expected Outcomes Short Term: Participant verbalizes understanding of the signs/symptoms and immediate care of hyper/hypoglycemia, proper foot care and importance of medication, aerobic/resistive exercise and nutrition plan for blood glucose control.;Long Term: Attainment of HbA1C < 7%.    Hypertension Yes    Intervention Provide education on lifestyle modifcations including regular physical activity/exercise, weight management, moderate sodium restriction and increased consumption of fresh fruit, vegetables, and  low fat dairy, alcohol moderation, and smoking cessation.;Monitor prescription use compliance.    Expected Outcomes Short Term: Continued assessment and intervention until BP is < 140/961mHG in hypertensive participants. < 130/8040mG in hypertensive participants with diabetes, heart failure or chronic kidney disease.;Long Term: Maintenance of blood pressure at goal levels.    Lipids Yes    Intervention Provide education and support for participant on nutrition & aerobic/resistive exercise along with prescribed medications to achieve LDL <75m64mDL >40mg53m Expected Outcomes Short Term: Participant states understanding of desired cholesterol values and is compliant with medications prescribed. Participant is following exercise prescription and nutrition guidelines.;Long Term: Cholesterol controlled with medications as prescribed, with individualized exercise RX and with personalized nutrition plan. Value goals: LDL < 75mg,13m > 40 mg.             Education:Diabetes - Individual verbal and written instruction to review signs/symptoms of diabetes, desired ranges of glucose level fasting, after meals and with exercise. Acknowledge that pre and post exercise glucose checks will be done for 3 sessions at entry of program. Flowsheet Row [facility]  Date 11/05/20  Educator [facility] Code 1- Verbalizes Understanding       Know Your Numbers and Heart Failure: - Group verbal and visual instruction to discuss disease risk factors for cardiac and pulmonary disease and treatment options.  Reviews associated critical values for Overweight/Obesity, Hypertension, Cholesterol, and Diabetes.  Discusses basics of heart failure: signs/symptoms and treatments.  Introduces Heart Failure Zone chart for action plan for heart failure.  Written material  given at graduation. Flowsheet Row Pulmonary Rehab from 12/31/2020 in [facility]  Re[facility]  Date 12/24/20  Educator [facility]  Instruction Review Code 1- Verbalizes Understanding       Core Components/Risk Factors/Patient Goals Review:   Goals and Risk Factor Review     Row Name 11/27/20 1339 12/17/20 1357           Core Components/Risk Factors/Patient Goals Review   Personal Goals Review Improve shortness of breath with ADL's Improve shortness of breath with ADL's      Review Spoke to patient about their shortness of breath and what they can do to improve. Patient has been informed of breathing techniques when starting the program. Patient is informed to tell staff if they have had any med changes and that certain meds they are taking or not taking can be causing shortness of breath. Practice pursed lip breathing- she keeps forgetting to use it and it feels unnatural. She does not wear a pulse ox at home to check o2 but is interested in getting one. She feels her shortness of breath has been about the same. She is taking all her medications and supplements as directed - she is having no problems.      Expected Outcomes Short: Attend [facility] regularly to improve shortness of breath with ADL's. Long: maintain independence with ADL's Short: Attend [facility] regularly to improve shortness of breath with ADL's. Long: maintain independence with ADL's               Core Components/Risk Factors/Patient Goals at Discharge (Final Review):   Goals and Risk Factor Review - 12/17/20 1357       Core Components/Risk Factors/Patient Goals Review   Personal Goals Review Improve shortness of breath with ADL's    Review Practice pursed lip breathing- she keeps forgetting to use it and it feels unnatural. She does not wear a pulse ox at home to check o2 but is interested in getting one. She feels her shortness of breath has been about the same. She is taking all her medications and supplements as directed - she is having no problems.    Expected Outcomes Short: Attend [facility] regularly  to improve shortness of breath with ADL's. Long: maintain independence with ADL's             ITP Comments:  ITP Comments     Row Name 10/29/20 1620 11/05/20 1507 11/10/20 1429 11/12/20 0743 12/10/20 0733   ITP Comments Virtual orientation call completed today. shehas an appointment on Date: 11/05/2020 for EP eval and gym Orientation.  Documentation of diagnosis can be found in [facility] Date: 10/01/2020. Completed 6MWT and gym orientation. Initial ITP created and sent for review to Dr. Zetta Bills, Medical Director. First full day of exercise!  Patient was oriented to gym and equipment including functions, settings, policies, and procedures.  Patient's individual exercise prescription and treatment plan were reviewed.  All starting workloads were established based on the results of the 6 minute walk test done at initial orientation visit.  The plan for exercise progression was also introduced and progression will be customized based on patient's performance and goals. 30 Day review completed. Medical Director ITP review done, changes made as directed, and signed approval by Medical Director.  New to program 30 Day review completed. Medical Director ITP review done, changes made as directed, and signed approval by Medical Director.    [place] Name 01/05/21 1552 01/07/21 0722         ITP  Comments Aicha's husband just called and they both have tested positive for COVID today.  She will be able to return on 01/15/21 if feeling better. 30 Day review completed. Medical Director ITP review done, changes made as directed, and signed approval by Medical Director.               Comments:

## 2021-01-19 ENCOUNTER — Telehealth: Payer: Self-pay

## 2021-01-19 NOTE — Telephone Encounter (Signed)
Dorothy Rogers is still not feeling great today - says she feels she "just cant shake"  Covid.  This staff recommended she call her Dr if not feeling better by tomorrow.  If she feels better she plans to return Wednesday.

## 2021-01-21 ENCOUNTER — Other Ambulatory Visit: Payer: Self-pay

## 2021-01-21 DIAGNOSIS — I272 Pulmonary hypertension, unspecified: Secondary | ICD-10-CM

## 2021-01-21 NOTE — Progress Notes (Signed)
Daily Session Note  Patient Details  Name: Dorothy Rogers MRN: [id number] Date of Birth: [birth year] Referring Provider:   Flowsheet Row Pulmonary Rehab from 11/05/2020 in [facility] Card[facility] Pul[facility] Rehab  Referring Provider Zetta Bills MD       Encounter Date: 01/21/2021  Check In:  Session Check In - 01/21/21 1342       Check-In   Supervising physician immediately available to respond to emergencies See telemetry face sheet for immediately available ER MD    Location [facility]-Cardiac & Pulmonary Rehab    Staff Present Birdie Sons, MPA, Nino Glow, MS, ASCM CEP, Exercise Physiologist;Joseph Tessie Fass, [place]    Virtual Visit No    Medication changes reported     No    Fall or balance concerns reported    No    Warm-up and Cool-down Performed on first and last piece of equipment    Resistance Training Performed Yes    VAD Patient? No    PAD/SET Patient? No      Pain Assessment   Currently in Pain? No/denies                Social History   Tobacco Use  Smoking Status Never  Smokeless Tobacco Never    Goals Met:  Independence with exercise equipment Exercise tolerated well No report of cardiac concerns or symptoms Strength training completed today  Goals Unmet:  Not Applicable  Comments: Pt able to follow exercise prescription today without complaint.  Will continue to monitor for progression.    Dr. Emily Filbert is Medical Director for [facility].  Dr. Ottie Glazier is Medical Director for [facility] Pulmonary Rehabilitation.

## 2021-01-22 ENCOUNTER — Encounter: Payer: Medicare Other | Admitting: *Deleted

## 2021-01-22 ENCOUNTER — Other Ambulatory Visit: Payer: Self-pay

## 2021-01-22 DIAGNOSIS — I272 Pulmonary hypertension, unspecified: Secondary | ICD-10-CM | POA: Diagnosis not present

## 2021-01-22 NOTE — Progress Notes (Signed)
Daily Session Note  Patient Details  Name: MARSHAWN NINNEMAN MRN: [id number] Date of Birth: 11/11/1950 Referring Provider:   Flowsheet Row Pulmonary Rehab from 11/05/2020 in [facility] Cardiac and Pulmonary Rehab  Referring Provider Zetta Bills MD       Encounter Date: 01/22/2021  Check In:  Session Check In - 01/22/21 1327       Check-In   Supervising physician immediately available to respond to emergencies See telemetry face sheet for immediately available ER MD    Location [facility]-Cardiac & Pulmonary Rehab    Staff Present Renita Papa, RN BSN;Joseph [place], RCP,RRT,BSRT;Jessica [place], [place], RCEP, CCRP, CCET    Virtual Visit No    Medication changes reported     No    Fall or balance concerns reported    No    Warm-up and Cool-down Performed on first and last piece of equipment    Resistance Training Performed Yes    VAD Patient? No    PAD/SET Patient? No      Pain Assessment   Currently in Pain? No/denies                Social History   Tobacco Use  Smoking Status Never  Smokeless Tobacco Never    Goals Met:  Independence with exercise equipment Exercise tolerated well No report of concerns or symptoms today Strength training completed today  Goals Unmet:  Not Applicable  Comments: Pt able to follow exercise prescription today without complaint.  Will continue to monitor for progression.    Dr. Emily Filbert is Medical Director for [facility].  Dr. Ottie Glazier is Medical Director for [facility] Pulmonary Rehabilitation.

## 2021-01-26 ENCOUNTER — Other Ambulatory Visit: Payer: Self-pay

## 2021-01-26 DIAGNOSIS — I272 Pulmonary hypertension, unspecified: Secondary | ICD-10-CM

## 2021-01-26 NOTE — Progress Notes (Signed)
Daily Session Note  Patient Details  Name: Dorothy Rogers MRN: [id number] Date of Birth: August 07, 1950 Referring Provider:   Flowsheet Row Pulmonary Rehab from 11/05/2020 in [facility] Cardiac and Pulmonary Rehab  Referring Provider Zetta Bills MD       Encounter Date: 01/26/2021  Check In:  Session Check In - 01/26/21 1347       Check-In   Supervising physician immediately available to respond to emergencies See telemetry face sheet for immediately available ER MD    Location [facility]-Cardiac & Pulmonary Rehab    Staff Present Birdie Sons, MPA, Nino Glow, MS, ASCM CEP, Exercise Physiologist;Joseph Tessie Fass, [place]    Virtual Visit No    Medication changes reported     No    Fall or balance concerns reported    No    Warm-up and Cool-down Performed on first and last piece of equipment    Resistance Training Performed Yes    VAD Patient? No    PAD/SET Patient? No      Pain Assessment   Currently in Pain? No/denies                Social History   Tobacco Use  Smoking Status Never  Smokeless Tobacco Never    Goals Met:  Independence with exercise equipment Exercise tolerated well Personal goals reviewed No report of concerns or symptoms today Strength training completed today  Goals Unmet:  Not Applicable  Comments: Pt able to follow exercise prescription today without complaint.  Will continue to monitor for progression.    Dr. Emily Filbert is Medical Director for [facility].  Dr. Ottie Glazier is Medical Director for [facility] Pulmonary Rehabilitation.

## 2021-01-29 ENCOUNTER — Encounter: Payer: Medicare Other | Attending: Pulmonary Disease | Admitting: *Deleted

## 2021-01-29 ENCOUNTER — Other Ambulatory Visit: Payer: Self-pay

## 2021-01-29 DIAGNOSIS — I272 Pulmonary hypertension, unspecified: Secondary | ICD-10-CM | POA: Diagnosis not present

## 2021-01-29 NOTE — Progress Notes (Signed)
Daily Session Note  Patient Details  Name: Dorothy Rogers MRN: [id number] Date of Birth: [birth year] Referring Provider:   Flowsheet Row Pulmonary Rehab from 11/05/2020 in [facility] Cardiac and Pulmonary Rehab  Referring Provider Zetta Bills MD       Encounter Date: 01/29/2021  Check In:  Session Check In - 01/29/21 1325       Check-In   Supervising physician immediately available to respond to emergencies See telemetry face sheet for immediately available ER MD    Location [facility]-Cardiac & Pulmonary Rehab    Staff Present Renita Papa, RN BSN;Joseph Tessie Fass, RCP,RRT,BSRT;Melissa [place], [place], LDN    Virtual Visit No    Medication changes reported     No    Fall or balance concerns reported    No    Warm-up and Cool-down Performed on first and last piece of equipment    Resistance Training Performed Yes    VAD Patient? No    PAD/SET Patient? No      Pain Assessment   Currently in Pain? No/denies                Social History   Tobacco Use  Smoking Status Never  Smokeless Tobacco Never    Goals Met:  Independence with exercise equipment Exercise tolerated well No report of concerns or symptoms today Strength training completed today  Goals Unmet:  Not Applicable  Comments: Pt able to follow exercise prescription today without complaint.  Will continue to monitor for progression.    Dr. Emily Filbert is Medical Director for [facility].  Dr. Ottie Glazier is Medical Director for [facility] Pulmonary Rehabilitation.

## 2021-02-04 ENCOUNTER — Other Ambulatory Visit: Payer: Self-pay

## 2021-02-04 ENCOUNTER — Encounter: Payer: Medicare Other | Admitting: *Deleted

## 2021-02-04 ENCOUNTER — Other Ambulatory Visit: Payer: Self-pay | Admitting: Internal Medicine

## 2021-02-04 ENCOUNTER — Encounter: Payer: Self-pay | Admitting: *Deleted

## 2021-02-04 DIAGNOSIS — I272 Pulmonary hypertension, unspecified: Secondary | ICD-10-CM

## 2021-02-04 DIAGNOSIS — Z1231 Encounter for screening mammogram for malignant neoplasm of breast: Secondary | ICD-10-CM

## 2021-02-04 NOTE — Progress Notes (Signed)
Pulmonary Individual Treatment Plan  Patient Details  Name: Dorothy Rogers MRN: [id number] Date of Birth: [birth year] Referring Provider:   Flowsheet Row Pulmonary Rehab from 11/05/2020 in [facility] Cardiac and Pulmonary Rehab  Referring Provider Zetta Bills MD       Initial Encounter Date:  Flowsheet Row Pulmonary Rehab from 11/05/2020 in [facility] Cardiac and Pulmonary Rehab  Date 11/05/20       Visit Diagnosis: Pulmonary hypertension ([place])  Patient's Home Medications on Admission:  Current Outpatient Medications:    acetaminophen (TYLENOL) 500 MG tablet, Take 1,000 mg by mouth every 6 (six) hours as needed for mild pain or moderate pain. , Disp: , Rfl:    acyclovir (ZOVIRAX) 400 MG tablet, Take 400 mg by mouth daily as needed (for fever blisters). , Disp: , Rfl:    aspirin EC 81 MG EC tablet, Take 1 tablet (81 mg total) by mouth daily. (Patient taking differently: Take 81 mg by mouth at bedtime.), Disp: , Rfl:    atorvastatin (LIPITOR) 40 MG tablet, Take 1 tablet by mouth once daily, Disp: , Rfl:    Biotin 10 MG TABS, Take by mouth., Disp: , Rfl:    Calcium Carb-Cholecalciferol (CALTRATE 600+D3 PO), Take 1 tablet by mouth daily., Disp: , Rfl:    Calcium Carbonate-Vitamin D 600-400 MG-UNIT tablet, Take by mouth. (Patient not taking: Reported on 10/29/2020), Disp: , Rfl:    CARTIA XT 120 MG 24 hr capsule, Take 120 mg by mouth daily., Disp: , Rfl: 3   Cholecalciferol (VITAMIN D-3) 125 MCG (5000 UT) TABS, Take 5,000 Units by mouth daily., Disp: , Rfl:    clopidogrel (PLAVIX) 75 MG tablet, Take 1 tablet by mouth once daily, Disp: , Rfl:    conjugated estrogens (PREMARIN) vaginal cream, Place 0.5 g vaginally 2 (two) times a week. (Patient not taking: Reported on 10/29/2020), Disp: , Rfl:    Cyanocobalamin (VITAMIN B-12) 5000 MCG SUBL, Place 5,000 mcg under the tongue daily., Disp: , Rfl:    diazepam (VALIUM) 2 MG tablet, Take by mouth., Disp: , Rfl:    ferrous sulfate 325 (65 FE) MG tablet, Take  325 mg by mouth daily with breakfast., Disp: , Rfl:    gabapentin (NEURONTIN) 100 MG capsule, Take by mouth., Disp: , Rfl:    glipiZIDE (GLUCOTROL) 10 MG tablet, Take 10 mg by mouth 2 (two) times daily. Before breakfast & before supper, Disp: , Rfl:    ibuprofen (ADVIL) 200 MG tablet, Take 400 mg by mouth every 8 (eight) hours as needed (for pain.)., Disp: , Rfl:    insulin glargine (LANTUS SOLOSTAR) 100 UNIT/ML Solostar Pen, INJECT 36 TO 50 UNITS SUBCUTANEOUSLY ONCE DAILY AS DIRECTED, Disp: , Rfl:    insulin lispro (HUMALOG) 100 UNIT/ML injection, Inject into the skin 3 (three) times daily before meals. Sliding scale, Disp: , Rfl:    loperamide (IMODIUM) 2 MG capsule, Take by mouth., Disp: , Rfl:    losartan (COZAAR) 100 MG tablet, Take 1 tablet by mouth once daily, Disp: , Rfl:    melatonin 1 MG TABS tablet, Take by mouth. (Patient not taking: Reported on 10/29/2020), Disp: , Rfl:    ondansetron (ZOFRAN-ODT) 8 MG disintegrating tablet, Take 8 mg by mouth as needed for nausea or vomiting., Disp: , Rfl:    pantoprazole (PROTONIX) 20 MG tablet, Take by mouth., Disp: , Rfl:    Probiotic CAPS, Take 1 capsule by mouth daily., Disp: , Rfl:    promethazine (PHENERGAN) 12.5 MG tablet, Take 1  tablet (12.5 mg total) by mouth every 6 (six) hours as needed for nausea or vomiting., Disp: 15 tablet, Rfl: 0 No current facility-administered medications for this visit.  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   Cataract    Chronic kidney disease    Stage III   Coronary artery disease    DDD (degenerative disc  disease), lumbar    Diabetes mellitus without complication ([place])    TYPE 2   Herpes simplex    Hyperlipidemia    Hypertension    CONTROLLED ON MEDS   Vertigo    occasional, no episodes in 2-3 months   Vertigo    OCCASIONALLY   Vitamin B 12 deficiency     Tobacco Use: Social History   Tobacco Use  Smoking Status Never  Smokeless Tobacco Never    Labs: Recent Review Flowsheet Data     Labs for ITP Cardiac and Pulmonary Rehab Latest Ref Rng & Units 06/09/2015 12/01/2015   Hemoglobin A1c 4.0 - 6.0 % 6.5(H) -   PHART 7.350 - 7.450 - 7.67(HH)   PCO2ART 32.0 - 48.0 mmHg - 17(LL)        Pulmonary Assessment Scores:  Pulmonary Assessment Scores     Row Name 10/29/20 1609 11/05/20 1517       ADL UCSD   ADL Phase Entry Entry    SOB Score total 88 88    Rest 2 2    Walk 4 4    Stairs 4 4    Bath 5 5    Dress 4 4    Shop 4 4         CAT Score   CAT Score 15 15         mMRC Score   mMRC Score -- 4             UCSD: Self-administered rating of dyspnea associated with activities of daily living (ADLs) 6-point scale (0 = "not at all" to 5 = "maximal or unable to do because of breathlessness")  Scoring Scores range from 0 to 120.  Minimally important difference is 5 units  CAT: CAT can identify the health impairment of COPD patients and is better correlated with disease progression.  CAT has a scoring range of zero to 40. The CAT score is classified into four groups of low (less than 10), medium (10 - 20), high (21-30) and very high (31-40) based on the impact level of disease on health status. A CAT score over 10 suggests significant symptoms.  A worsening CAT score could be explained by an exacerbation, poor medication adherence, poor inhaler technique, or progression of COPD or comorbid conditions.  CAT MCID is 2 points  mMRC: mMRC (Modified Medical Research Council) Dyspnea Scale is used to assess the degree of baseline functional disability in patients of  respiratory disease due to dyspnea. No minimal important difference is established. A decrease in score of 1 point or greater is considered a positive change.   Pulmonary Function Assessment:  Pulmonary Function Assessment -  11/05/20 1516       Breath   Shortness of Breath Yes;Panic with Shortness of Breath;Fear of Shortness of Breath;Limiting activity             Exercise Target Goals: Exercise Program Goal: Individual exercise prescription set using results from initial 6 min walk test and THRR while considering  patient's activity barriers and safety.   Exercise Prescription Goal: Initial exercise prescription builds to 30-45 minutes a day of aerobic activity, 2-3 days per week.  Home exercise guidelines will be given to patient during program as part of exercise prescription that the participant will acknowledge.  Education: Aerobic Exercise: - Group verbal and visual presentation on the components of exercise prescription. Introduces F.I.T.T principle from ACSM for exercise prescriptions.  Reviews F.I.T.T. principles of aerobic exercise including progression. Written material given at graduation. Flowsheet Row Pulmonary Rehab from 01/21/2021 in [facility] Cardiac and Pulmonary Rehab  Date 01/21/21  Educator [place]  Instruction Review Code 1- Verbalizes Understanding       Education: Resistance Exercise: - Group verbal and visual presentation on the components of exercise prescription. Introduces F.I.T.T principle from ACSM for exercise prescriptions  Reviews F.I.T.T. principles of resistance exercise including progression. Written material given at graduation. Flowsheet Row Pulmonary Rehab from 01/21/2021 in [facility] Cardiac and Pulmonary Rehab  Date 11/26/20  Educator [place]  Instruction Review Code 1- Verbalizes Understanding        Education: Exercise & Equipment Safety: - Individual verbal instruction and demonstration of equipment use and safety with use of the equipment. Flowsheet  Row Pulmonary Rehab from 01/21/2021 in [facility] Cardiac and Pulmonary Rehab  Date 11/05/20  Educator [facility]  Instruction Review Code 1- Verbalizes Understanding       Education: Exercise Physiology & General Exercise Guidelines: - Group verbal and written instruction with models to review the exercise physiology of the cardiovascular system and associated critical values. Provides general exercise guidelines with specific guidelines to those with heart or lung disease.  Flowsheet Row Pulmonary Rehab from 01/21/2021 in [facility] Cardiac and Pulmonary Rehab  Date 11/12/20  Educator AS  Instruction Review Code 1- Verbalizes Understanding       Education: Flexibility, Balance, Mind/Body Relaxation: - Group verbal and visual presentation with interactive activity on the components of exercise prescription. Introduces F.I.T.T principle from ACSM for exercise prescriptions. Reviews F.I.T.T. principles of flexibility and balance exercise training including progression. Also discusses the mind body connection.  Reviews various relaxation techniques to help reduce and manage stress (i.e. Deep breathing, progressive muscle relaxation, and visualization). Balance handout provided to take home. Written material given at graduation. Flowsheet Row Pulmonary Rehab from 01/21/2021 in [facility] Cardiac and Pulmonary Rehab  Date 12/03/20  Educator [facility]  Instruction Review Code 1- Verbalizes Understanding       Activity Barriers & Risk Stratification:  Activity Barriers & Cardiac Risk Stratification - 11/05/20 1510       Activity Barriers & Cardiac Risk Stratification   Activity Barriers Arthritis;Back Problems;Neck/Spine Problems;Muscular Weakness;Deconditioning;Balance Concerns;Shortness of Breath;Assistive Device;Joint Problems             6 Minute Walk:  6 Minute Walk     Row Name 11/05/20 1508         6 Minute Walk   Phase Initial     Distance 296 feet     Walk Time 4.88 minutes     # of Rest Breaks 5   10 sec, 17 sec, 12 sec, 10 sec, stopped at 5:32  MPH 0.67     METS 1.24     RPE 15     Perceived Dyspnea  3     VO2 Peak 4.35     Symptoms Yes (comment)     Comments SOB, chronic hip pain 3/10     Resting HR 95 bpm     Resting BP 132/64     Resting Oxygen Saturation  98 %     Exercise Oxygen Saturation  during 6 min walk 98 %     Max Ex. HR 128 bpm     Max Ex. BP 146/72     2 Minute Post BP 130/64           Interval HR   1 Minute HR 127     2 Minute HR 128     3 Minute HR 127     4 Minute HR 123     5 Minute HR 124     6 Minute HR 113     2 Minute Post HR 106     Interval Heart Rate? Yes           Interval Oxygen   Interval Oxygen? Yes     Baseline Oxygen Saturation % 98 %     1 Minute Oxygen Saturation % 98 %     1 Minute Liters of Oxygen 0 L  Room Air     2 Minute Oxygen Saturation % 99 %     2 Minute Liters of Oxygen 0 L     3 Minute Oxygen Saturation % 99 %     3 Minute Liters of Oxygen 0 L     4 Minute Oxygen Saturation % 98 %     4 Minute Liters of Oxygen 0 L     5 Minute Oxygen Saturation % 98 %     5 Minute Liters of Oxygen 0 L     6 Minute Oxygen Saturation % 98 %     6 Minute Liters of Oxygen 0 L     2 Minute Post Oxygen Saturation % 98 %     2 Minute Post Liters of Oxygen 0 L             Oxygen Initial Assessment:  Oxygen Initial Assessment - 01/26/21 1520       Home Oxygen   Home Oxygen Device None    Sleep Oxygen Prescription None    Home Exercise Oxygen Prescription None    Home Resting Oxygen Prescription None    Compliance with Home Oxygen Use Yes      Initial 6 min Walk   Oxygen Used None      Program Oxygen Prescription   Program Oxygen Prescription None      Intervention   Short Term Goals To learn and demonstrate proper pursed lip breathing techniques or other breathing techniques.     Long  Term Goals Exhibits proper breathing techniques, such as pursed lip breathing or other method taught during program session              Oxygen Re-Evaluation:  Oxygen Re-Evaluation     [place] Name 11/27/20 1337 12/17/20 1354 01/26/21 1520         Program Oxygen Prescription   Program Oxygen Prescription None None --           Home Oxygen   Home Oxygen Device None None --     Sleep Oxygen Prescription None None --     Home  Exercise Oxygen Prescription None None --     Home Resting Oxygen Prescription None None --     Compliance with Home Oxygen Use Yes Yes --           Goals/Expected Outcomes   Short Term Goals To learn and demonstrate proper pursed lip breathing techniques or other breathing techniques.  To learn and demonstrate proper pursed lip breathing techniques or other breathing techniques.  --     Long  Term Goals Exhibits proper breathing techniques, such as pursed lip breathing or other method taught during program session Exhibits proper breathing techniques, such as pursed lip breathing or other method taught during program session --     Comments Informed patient how to perform the Pursed Lipped breathing technique. Told patient to Inhale through the nose and out the mouth with pursed lips to keep their airways open, help oxygenate them better, practice when at rest or doing strenuous activity. Patient Verbalizes understanding of technique and will work on and be reiterated during [place]. Practice pursed lip breathing- she keeps forgetting to use it and it feels unnatural. She does not wear a pulse ox at home to check o2 but is interested in getting one. She feels her shortness of breath has been about the same. Stuti still has yet to get a pulse ox and we went over where she can get one. She recently recovering from Covid and is curious if anything she changed. She is aware her O2 needs to stay above 88%. She is continuing PLB especially when she has worse breathing days     Goals/Expected Outcomes Short: use PLB with exertion. Long: use PLB on exertion proficiently and independently. Short: use PLB  with exertion, get pulse ox. Long: use PLB on exertion proficiently and independently. Short: use PLB with exertion, get pulse ox. Long: use PLB on exertion proficiently and independently.              Oxygen Discharge (Final Oxygen Re-Evaluation):  Oxygen Re-Evaluation - 01/26/21 1520       Goals/Expected Outcomes   Comments Dashanna still has yet to get a pulse ox and we went over where she can get one. She recently recovering from Covid and is curious if anything she changed. She is aware her O2 needs to stay above 88%. She is continuing PLB especially when she has worse breathing days    Goals/Expected Outcomes Short: use PLB with exertion, get pulse ox. Long: use PLB on exertion proficiently and independently.             Initial Exercise Prescription:  Initial Exercise Prescription - 11/05/20 1500       Date of Initial Exercise RX and Referring Provider   Date 11/05/20    Referring Provider Zetta Bills MD      Recumbant Bike   Level 1    RPM 50    Watts 5    Minutes 15    METs 2      NuStep   Level 1    SPM 80    Minutes 15    METs 2      REL-XR   Level 1    Speed 50    Minutes 15    METs 2      Track   Laps 8    Minutes 15    METs 1.4      Prescription Details   Frequency (times per week) 3    Duration Progress to 30 minutes  of continuous aerobic without signs/symptoms of physical distress      Intensity   THRR 40-80% of Max Heartrate 117-139    Ratings of Perceived Exertion 11-13    Perceived Dyspnea 0-4      Progression   Progression Continue to progress workloads to maintain intensity without signs/symptoms of physical distress.      Resistance Training   Training Prescription Yes    Weight 3 lb    Reps 10-15             Perform Capillary Blood Glucose checks as needed.  Exercise Prescription Changes:   Exercise Prescription Changes     Row Name 11/05/20 1500 11/18/20 1500 12/04/20 0800 12/17/20 1400 12/18/20 1400      Response to Exercise   Blood Pressure (Admit) 132/64 132/82 130/62 120/60 --   Blood Pressure (Exercise) 146/72 142/80 174/78 -- --   Blood Pressure (Exit) 104/62 134/62 112/70 102/60 --   Heart Rate (Admit) 95 bpm 100 bpm 96 bpm 100 bpm --   Heart Rate (Exercise) 128 bpm 101 bpm 116 bpm 115 bpm --   Heart Rate (Exit) 94 bpm 106 bpm 106 bpm 105 bpm --   Oxygen Saturation (Admit) 98 % 93 % 96 % 98 % --   Oxygen Saturation (Exercise) 98 % 98 % 98 % 98 % --   Oxygen Saturation (Exit) 98 % 99 % 99 % 99 % --   Rating of Perceived Exertion (Exercise) _0 --   Perceived Dyspnea (Exercise) _1 --   Symptoms SOB, hip pain (3/10) SOB SOB SOB, back pain --   Comments walk test results -- -- -- --   Duration -- Progress to 30 minutes of  aerobic without signs/symptoms of physical distress Progress to 30 minutes of  aerobic without signs/symptoms of physical distress Continue with 30 min of aerobic exercise without signs/symptoms of physical distress. --   Intensity -- THRR unchanged THRR unchanged THRR unchanged --     Progression   Progression -- Continue to progress workloads to maintain intensity without signs/symptoms of physical distress. Continue to progress workloads to maintain intensity without signs/symptoms of physical distress. Continue to progress workloads to maintain intensity without signs/symptoms of physical distress. --   Average METs -- 1.83 2.44 2.6 --     Resistance Training   Training Prescription -- Yes Yes Yes --   Weight -- 3 lb 3 lb 3 lb --   Reps -- 10-15 10-15 10-15 --     Interval Training   Interval Training -- No No No --     NuStep   Level -- 1 -- -- --   Minutes -- 15 -- -- --   METs -- 1.8 -- -- --     REL-XR   Level -- _2 --   Minutes -- _3 --   METs -- 1.9 3.2 3.3 --     T5 Nustep   Level -- _4 --   Minutes -- _5 --   METs -- 1.8 1.8 1.8 --     Track   Laps -- -- 4 -- --   Minutes -- -- 15 -- --   METs -- -- 1.2 --  --     Home Exercise Plan   Plans to continue exercise at -- -- -- -- Home (comment)  Recumbant bike, [employer] staff videos   Frequency -- -- -- -- Add 3  additional days to program exercise sessions.  Start with 1   Initial Home Exercises Provided -- -- -- -- 12/18/20    Row Name 12/30/20 1300 01/14/21 0900 01/26/21 1100         Response to Exercise   Blood Pressure (Admit) 142/68 122/70 134/76     Blood Pressure (Exercise) -- 162/58 --     Blood Pressure (Exit) 124/64 132/64 102/60     Heart Rate (Admit) 108 bpm 90 bpm 93 bpm     Heart Rate (Exercise) 106 bpm 117 bpm 99 bpm     Heart Rate (Exit) 99 bpm 90 bpm 87 bpm     Oxygen Saturation (Admit) 98 % 98 % 98 %     Oxygen Saturation (Exercise) 97 % 98 % 95 %     Oxygen Saturation (Exit) 97 % 98 % 99 %     Rating of Perceived Exertion (Exercise) _0 Perceived Dyspnea (Exercise) _1 Symptoms none SOB SOB     Duration Continue with 30 min of aerobic exercise without signs/symptoms of physical distress. Continue with 30 min of aerobic exercise without signs/symptoms of physical distress. Continue with 30 min of aerobic exercise without signs/symptoms of physical distress.     Intensity THRR unchanged THRR unchanged THRR unchanged           Progression   Progression Continue to progress workloads to maintain intensity without signs/symptoms of physical distress. Continue to progress workloads to maintain intensity without signs/symptoms of physical distress. Continue to progress workloads to maintain intensity without signs/symptoms of physical distress.     Average METs 2.4 2.16 1.5           Resistance Training   Training Prescription Yes Yes Yes     Weight 3 lb 3 lb 3 lb     Reps 10-15 10-15 10-15           Interval Training   Interval Training No No No           REL-XR   Level -- 1 --     Minutes -- 30 --     METs -- 3 --           T5 Nustep   Level _2 Minutes _3 METs 2.4 1.8 1.8            Track   Laps -- 4 --     Minutes -- 15 --     METs -- 1.2 --           Home Exercise Plan   Plans to continue exercise at -- Home (comment)  Recumbant bike, [employer] staff videos Home (comment)  Recumbant bike, [employer] staff videos     Frequency -- Add 3 additional days to program exercise sessions.  Start with 1 Add 3 additional days to program exercise sessions.  Start with 1     Initial Home Exercises Provided -- 12/18/20 12/18/20              Exercise Comments:   Exercise Comments     Row Name 11/10/20 1429           Exercise Comments First full day of exercise!  Patient was oriented to gym and equipment including functions, settings, policies, and procedures.  Patient's individual exercise prescription and treatment plan were reviewed.  All starting workloads were established  based on the results of the 6 minute walk test done at initial orientation visit.  The plan for exercise progression was also introduced and progression will be customized based on patient's performance and goals.                Exercise Goals and Review:   Exercise Goals     Row Name 11/05/20 1512             Exercise Goals   Increase Physical Activity Yes       Intervention Provide advice, education, support and counseling about physical activity/exercise needs.;Develop an individualized exercise prescription for aerobic and resistive training based on initial evaluation findings, risk stratification, comorbidities and participant's personal goals.       Expected Outcomes Long Term: Add in home exercise to make exercise part of routine and to increase amount of physical activity.;Short Term: Attend rehab on a regular basis to increase amount of physical activity.;Long Term: Exercising regularly at least 3-5 days a week.       Increase Strength and Stamina Yes       Intervention Provide advice, education, support and counseling about physical activity/exercise needs.;Develop an  individualized exercise prescription for aerobic and resistive training based on initial evaluation findings, risk stratification, comorbidities and participant's personal goals.       Expected Outcomes Short Term: Increase workloads from initial exercise prescription for resistance, speed, and METs.;Short Term: Perform resistance training exercises routinely during rehab and add in resistance training at home;Long Term: Improve cardiorespiratory fitness, muscular endurance and strength as measured by increased METs and functional capacity (6MWT)       Able to understand and use rate of perceived exertion (RPE) scale Yes       Intervention Provide education and explanation on how to use RPE scale       Expected Outcomes Short Term: Able to use RPE daily in rehab to express subjective intensity level;Long Term:  Able to use RPE to guide intensity level when exercising independently       Able to understand and use Dyspnea scale Yes       Intervention Provide education and explanation on how to use Dyspnea scale       Expected Outcomes Short Term: Able to use Dyspnea scale daily in rehab to express subjective sense of shortness of breath during exertion;Long Term: Able to use Dyspnea scale to guide intensity level when exercising independently       Knowledge and understanding of Target Heart Rate Range (THRR) Yes       Intervention Provide education and explanation of THRR including how the numbers were predicted and where they are located for reference       Expected Outcomes Short Term: Able to state/look up THRR;Short Term: Able to use daily as guideline for intensity in rehab;Long Term: Able to use THRR to govern intensity when exercising independently       Able to check pulse independently Yes       Intervention Provide education and demonstration on how to check pulse in carotid and radial arteries.;Review the importance of being able to check your own pulse for safety during independent exercise        Expected Outcomes Short Term: Able to explain why pulse checking is important during independent exercise;Long Term: Able to check pulse independently and accurately       Understanding of Exercise Prescription Yes       Intervention Provide education, explanation, and written materials on  patient's individual exercise prescription       Expected Outcomes Short Term: Able to explain program exercise prescription;Long Term: Able to explain home exercise prescription to exercise independently                Exercise Goals Re-Evaluation :  Exercise Goals Re-Evaluation     Row Name 11/10/20 1429 11/18/20 1517 12/04/20 0833 12/17/20 1358 12/17/20 1451     Exercise Goal Re-Evaluation   Exercise Goals Review Able to understand and use rate of perceived exertion (RPE) scale;Able to understand and use Dyspnea scale;Knowledge and understanding of Target Heart Rate Range (THRR);Understanding of Exercise Prescription Increase Physical Activity;Increase Strength and Stamina;Understanding of Exercise Prescription Increase Physical Activity;Increase Strength and Stamina;Understanding of Exercise Prescription Increase Physical Activity;Increase Strength and Stamina;Understanding of Exercise Prescription Increase Physical Activity;Increase Strength and Stamina;Understanding of Exercise Prescription   Comments Reviewed RPE and dyspnea scales, THR and program prescription with pt today.  Pt voiced understanding and was given a copy of goals to take home. Teiara is doing well in rehab.  She has completed her first four full exercise sessions.  We will start to increase her workloads and continue to monitor her progress. Yasmene is doing well. Her O2 sats remain in appropriate range while exercising and has now tried to walk the track with her walker in which she should build up tolerance overtime. Will continue to monitor her progress. She reports being limited due to her back and hip. She stays active around the  house. EP still needs to go over home exercise. Emoni walked some today using the handrail.  She is limited by her back still.  She is up to 2.8 METs on the XR.  We will continue to monitor her progress.   Expected Outcomes Short: Use RPE daily to regulate intensity. Long: Follow program prescription in THR. Short: Increase workloads Long: Continue to improve stamina Short: Increase number of laps on track Long: Continue to increase overall MET level Short: EP to go over home exercise Long: Continue to increase overall MET level Short: Increase walking stamina Long: Continue to improve stamina    Row Name 12/18/20 1406 12/30/20 1352 01/14/21 0959 01/26/21 1105 01/26/21 1342     Exercise Goal Re-Evaluation   Exercise Goals Review Increase Physical Activity;Increase Strength and Stamina;Understanding of Exercise Prescription Increase Physical Activity;Increase Strength and Stamina Increase Physical Activity;Increase Strength and Stamina Increase Physical Activity;Increase Strength and Stamina Increase Physical Activity;Increase Strength and Stamina;Understanding of Exercise Prescription   Comments Reviewed home exercise with pt today.  Pt plans to use her recumbant bike and [employer] staff videos for exercise. We reviewed the [facility] and patient is highly interested in joining with her husband. Reviewed THR, pulse, RPE, sign and symptoms, pulse oximetery and when to call 911 or MD.  Also discussed weather considerations and indoor options.  Pt voiced understanding. Poppi attends consistently but is not reaching THR range.  Staff will encourage increasing levels on seated machines. Claris has been out with Covid and has not been able to attend her rehab sessions the last couple of weeks. She should be due to attend again tomorrow, 8/18 Allicia has just started back after being out with Covid.  Staff will monitor progress. Azadeh just moved and has a recumbant bike she plans to use at home for exercise but hasn't  unpacked it yet. In the interim, she tries to walk her around her house and stay active outside. She is still recovering from covid which made her feel  fatigued. We talked about exercise vs. being physically active.  She is limited byback and hip pain. She does check her HR at home when she is active and I encouraged her to keep that up.   Expected Outcomes Short: Add on 1 day of exercise at home: Continue to exercise at home independently at appropriate prescription Short: increase levels on seated machines Long:  reach THR range during exercise Short: Attend rehab consistently Long: Continue to increase overall MET level Short:  get back to consistent attendance  Long:  build overall stamina Short: Start using recumbant bike Long: Exercise independently at home at appropriate prescription            Discharge Exercise Prescription (Final Exercise Prescription Changes):  Exercise Prescription Changes - 01/26/21 1100       Response to Exercise   Blood Pressure (Admit) 134/76    Blood Pressure (Exit) 102/60    Heart Rate (Admit) 93 bpm    Heart Rate (Exercise) 99 bpm    Heart Rate (Exit) 87 bpm    Oxygen Saturation (Admit) 98 %    Oxygen Saturation (Exercise) 95 %    Oxygen Saturation (Exit) 99 %    Rating of Perceived Exertion (Exercise) 14    Perceived Dyspnea (Exercise) 2    Symptoms SOB    Duration Continue with 30 min of aerobic exercise without signs/symptoms of physical distress.    Intensity THRR unchanged      Progression   Progression Continue to progress workloads to maintain intensity without signs/symptoms of physical distress.    Average METs 1.5      Resistance Training   Training Prescription Yes    Weight 3 lb    Reps 10-15      Interval Training   Interval Training No      T5 Nustep   Level 1    Minutes 30    METs 1.8      Home Exercise Plan   Plans to continue exercise at Home (comment)   Recumbant bike, [employer] staff videos   Frequency Add 3  additional days to program exercise sessions.   Start with 1   Initial Home Exercises Provided 12/18/20             Nutrition:  Target Goals: Understanding of nutrition guidelines, daily intake of sodium <1564m, cholesterol <2052m calories 30% from fat and 7% or less from saturated fats, daily to have 5 or more servings of fruits and vegetables.  Education: All About Nutrition: -Group instruction provided by verbal, written material, interactive activities, discussions, models, and posters to present general guidelines for heart healthy nutrition including fat, fiber, MyPlate, the role of sodium in heart healthy nutrition, utilization of the nutrition label, and utilization of this knowledge for meal planning. Follow up email sent as well. Written material given at graduation. [facility]  Education need identified 11/05/20  Date 12/10/20  Educator MCBaldwinInstruction Review Code 1- Verbalizes Understanding       Biometrics:  Pre Biometrics - 11/05/20 1513       Pre Biometrics   Height 5' 2.5" (1.588 m)    Weight 178 lb 8 oz (81 kg)    BMI (Calculated) 32.11    Single Leg Stand 14.4 seconds              Nutrition Therapy Plan and Nutrition Goals:  Nutrition Therapy & Goals - 11/17/20 1432  Nutrition Therapy   Diet Heart healthy, low Na, diabetes friendly eating    Drug/Food Interactions Statins/Certain Fruits    Protein (specify units) 65g    Fiber 25 grams    Whole Grain Foods 3 servings    Saturated Fats 12 max. grams    Fruits and Vegetables 8 servings/day    Sodium 1.5 grams      Personal Nutrition Goals   Nutrition Goal ST: practice CHO counting at meals LT: follow MyPlate guidelines    Comments B: varies: this morning was egg and bacon and 2/3 toast (white) with some berries. With a cup of hot tea (equal). L sandwich or 1/2 sandwich (tomato, tuna, bolonga). With water. D: varies:  tonight - hamburgers - will only have chips if BG is ok. Salmon cakes, chicken, pork chops. Chicken and dumplings, pizza 2x/month, quesadillas. S: cheese puffs or cheese or peanut butter. Drinks: water. CKD stg 3. She will tak eher Lantus in AM, she also has humulog and usually does not need it. Discussed heart healthy, diabetes friendly.      Intervention Plan   Intervention Prescribe, educate and counsel regarding individualized specific dietary modifications aiming towards targeted core components such as weight, hypertension, lipid management, diabetes, heart failure and other comorbidities.    Expected Outcomes Short Term Goal: Understand basic principles of dietary content, such as calories, fat, sodium, cholesterol and nutrients.;Short Term Goal: A plan has been developed with personal nutrition goals set during dietitian appointment.;Long Term Goal: Adherence to prescribed nutrition plan.             Nutrition Assessments:  MEDIFICTS Score Key: ?70 Need to make dietary changes  40-70 Heart Healthy Diet ? 40 Therapeutic Level Cholesterol Diet  Flowsheet Row Pulmonary Rehab from 11/05/2020 in [facility] Cardiac and Pulmonary Rehab  Picture Your Plate Total Score on Admission 60      Picture Your Plate Scores: <28 Unhealthy dietary pattern with much room for improvement. 41-50 Dietary pattern unlikely to meet recommendations for good health and room for improvement. 51-60 More healthful dietary pattern, with some room for improvement.  >60 Healthy dietary pattern, although there may be some specific behaviors that could be improved.   Nutrition Goals Re-Evaluation:  Nutrition Goals Re-Evaluation     [place] Name 12/17/20 1400 01/26/21 1347           Goals   Nutrition Goal ST: practice CHO counting at meals, 2-3 CHO per meal LT: follow MyPlate guidelines ST: practice CHO counting at meals, 2-3 CHO per meal LT: follow MyPlate guidelines      Comment She reports she is practicing CHO  counting, she can not recal how much she normally has at meals due to focusing more on quality of CHO. She tries to avoid snacking. Reviewed CHO counting and complex carbohydrates. She is eating a lot of produce from her garden. Conny has cut down on sodium and sugar. Most of the time shes opting to get fresh fruit and vegatables.  She is mentally keeping track of CHO and looks at the labels at most things she eats. She lost weight from recently being sick nut has gained most of it back. She is limiting her snacks especially at night.      Expected Outcome ST: practice CHO counting at meals, 2-3 CHO per meal LT: follow MyPlate guidelines ST: practice CHO counting at meals, 2-3 CHO per meal LT: follow MyPlate guidelines  Nutrition Goals Discharge (Final Nutrition Goals Re-Evaluation):  Nutrition Goals Re-Evaluation - 01/26/21 1347       Goals   Nutrition Goal ST: practice CHO counting at meals, 2-3 CHO per meal LT: follow MyPlate guidelines    Comment Dianne has cut down on sodium and sugar. Most of the time shes opting to get fresh fruit and vegatables.  She is mentally keeping track of CHO and looks at the labels at most things she eats. She lost weight from recently being sick nut has gained most of it back. She is limiting her snacks especially at night.    Expected Outcome ST: practice CHO counting at meals, 2-3 CHO per meal LT: follow MyPlate guidelines             Psychosocial: Target Goals: Acknowledge presence or absence of significant depression and/or stress, maximize coping skills, provide positive support system. Participant is able to verbalize types and ability to use techniques and skills needed for reducing stress and depression.   Education: Stress, Anxiety, and Depression - Group verbal and visual presentation to define topics covered.  Reviews how body is impacted by stress, anxiety, and depression.  Also discusses healthy ways to reduce stress and to  treat/manage anxiety and depression.  Written material given at graduation.   Education: Sleep Hygiene -Provides group verbal and written instruction about how sleep can affect your health.  Define sleep hygiene, discuss sleep cycles and impact of sleep habits. Review good sleep hygiene tips.    Initial Review & Psychosocial Screening:  Initial Psych Review & Screening - 10/29/20 1550       Initial Review   Current issues with Current Stress Concerns;Current Sleep Concerns    Source of Stress Concerns Unable to perform yard/household activities    Comments Worries about everything. [place]? Yes   husband and son     Barriers   Psychosocial barriers to participate in program There are no identifiable barriers or psychosocial needs.;The patient should benefit from training in stress management and relaxation.      Screening Interventions   Interventions Encouraged to exercise;To provide support and resources with identified psychosocial needs;Provide feedback about the scores to participant    Expected Outcomes Short Term goal: Utilizing psychosocial counselor, staff and physician to assist with identification of specific Stressors or current issues interfering with healing process. Setting desired goal for each stressor or current issue identified.;Long Term Goal: Stressors or current issues are controlled or eliminated.;Short Term goal: Identification and review with participant of any Quality of Life or Depression concerns found by scoring the questionnaire.;Long Term goal: The participant improves quality of Life and PHQ9 Scores as seen by post scores and/or verbalization of changes             Quality of Life Scores:  Scores of 19 and below usually indicate a poorer quality of life in these areas.  A difference of  2-3 points is a clinically meaningful difference.  A difference of 2-3 points in the total score of the  Quality of Life Index has been associated with significant improvement in overall quality of life, self-image, physical symptoms, and general health in studies assessing change in quality of life.  PHQ-9: Recent Review Flowsheet Data     Depression screen [facility] 2/9 11/27/2020 11/05/2020   Decreased Interest 3 3   Down, Depressed, Hopeless 0 1   PHQ - 2 Score 3 4  Altered sleeping 1 2   Tired, decreased energy 3 3   Change in appetite 0 0   Feeling bad or failure about yourself  0 1   Trouble concentrating 0 0   Moving slowly or fidgety/restless 0 0   Suicidal thoughts 0 0   PHQ-9 Score 7 10   Difficult doing work/chores Somewhat difficult Somewhat difficult      Interpretation of Total Score  Total Score Depression Severity:  1-4 = Minimal depression, 5-9 = Mild depression, 10-14 = Moderate depression, 15-19 = Moderately severe depression, 20-27 = Severe depression   Psychosocial Evaluation and Intervention:  Psychosocial Evaluation - 10/29/20 1613       Psychosocial Evaluation & Interventions   Interventions Encouraged to exercise with the program and follow exercise prescription;Relaxation education    Comments Roseanna has no barriers to attending the program. She has a great support system with her husband at home. She does have some concerns with her sleep pattern and has tried melatonin that did not help. Her stress is over the fact that she cannot participate in actitivities because of fatigue or shortness of breath stopping her. She worries and cannot always make decisions per her husband and [place]. She is looking forward to attending the program to see if she can gain some stamina and less shortness of breath with activity. She does have back and hip chronic concerns. She is being treated with injections and meds to help control the pain . She is ready to start the program.    Expected Outcomes STG Lashe is able to attend all scheduled sessions and gain some stamina to be able to  do more at home with less fatigue and shortness of breath. Hoping this will reduce the stress of not participating in her daily activities.  LTG: Tiffanni will be able to maintain her progress from the program    Continue Psychosocial Services  Follow up required by staff             Psychosocial Re-Evaluation:  Psychosocial Re-Evaluation     [place] Name 11/27/20 1343 12/17/20 1403 01/26/21 1350         Psychosocial Re-Evaluation   Current issues with Current Anxiety/Panic;Current Psychotropic Meds Current Anxiety/Panic;Current Psychotropic Meds Current Anxiety/Panic;Current Psychotropic Meds     Comments Reviewed patient health questionnaire (PHQ-9) with patient for follow up. Previously, patients score indicated signs/symptoms of depression.  Reviewed to see if patient is improving symptom wise while in program.  Score improved/declined and patient states that it is because of her support system. Her husband son and family is telling her that her health is going to get better and makes her feel more positve. She gets stressed due to her breathing and not being able to do the things she needs or wants to do. She has a prescription for ativan, but has not used it yet. To calm herself down she will sit and put her head down - she also talks to her husband (he is a good support system for her). She feels like she is putting too much pressure on herself, encouraged her that she is doing a good job at rehab. She reports sleeping well most night, but sometimes she will be up until 4am - sometimes she will have racing thoughts - she reports that is normal for her. Akeelah is doing well. She sometimes get down but has really good support from her husband that picks her back up.  Shawntae takes ativan as needed and just  used one only in the last couple of months. She is recovering from [place] which brought her energy down. She states her mind races at night and we talked about trying out guided meditation or deep  breathing right before bed. She has no interest in any other itnervention for mental health at this time.     Expected Outcomes Short: Continue to attend [facility] regularly for regular exercise and social engagement. Long: Continue to improve symptoms and manage a positive mental state. Short: Continue to attend [facility] regularly for regular exercise and social engagement. Long: Continue to improve symptoms and manage a positive mental state. Short: Continue attendance with rehab Long: utilize exercise for stress management and maintain positive attitude     Interventions Encouraged to attend Pulmonary Rehabilitation for the exercise Encouraged to attend Pulmonary Rehabilitation for the exercise Encouraged to attend Pulmonary Rehabilitation for the exercise     Continue Psychosocial Services  Follow up required by staff Follow up required by staff Follow up required by staff              Psychosocial Discharge (Final Psychosocial Re-Evaluation):  Psychosocial Re-Evaluation - 01/26/21 1350       Psychosocial Re-Evaluation   Current issues with Current Anxiety/Panic;Current Psychotropic Meds    Comments Leanne is doing well. She sometimes get down but has really good support from her husband that picks her back up.  Mckynlie takes ativan as needed and just used one only in the last couple of months. She is recovering from [place] which brought her energy down. She states her mind races at night and we talked about trying out guided meditation or deep breathing right before bed. She has no interest in any other itnervention for mental health at this time.    Expected Outcomes Short: Continue attendance with rehab Long: utilize exercise for stress management and maintain positive attitude    Interventions Encouraged to attend Pulmonary Rehabilitation for the exercise    Continue Psychosocial Services  Follow up required by staff             Education: Education Goals: Education classes will  be provided on a weekly basis, covering required topics. Participant will state understanding/return demonstration of topics presented.  Learning Barriers/Preferences:   General Pulmonary Education Topics:  Infection Prevention: - Provides verbal and written material to individual with discussion of infection control including proper hand washing and proper equipment cleaning during exercise session. Flowsheet Row Pulmonary Rehab from [facility]/21/2021 in [facility] Cardiac and Pulmonary Rehab  Date 11/05/20  Educator [facility]  Instruction Review Code 1- Verbalizes Understanding       Falls Prevention: - Provides verbal and written material to individual with discussion of falls prevention and safety. Flowsheet Row Pulmonary Rehab from 01/21/2021 in [facility] Cardiac and Pulmonary Rehab  Date 10/29/20  Educator SB  Instruction Review Code 1- Verbalizes Understanding       Chronic Lung Disease Review: - Group verbal instruction with posters, models, PowerPoint presentations and videos,  to review new updates, new respiratory medications, new advancements in procedures and treatments. Providing information on websites and "800" numbers for continued self-education. Includes information about supplement oxygen, available portable oxygen systems, continuous and intermittent flow rates, oxygen safety, concentrators, and Medicare reimbursement for oxygen. Explanation of Pulmonary Drugs, including class, frequency, complications, importance of spacers, rinsing mouth after steroid MDI's, and proper cleaning methods for nebulizers. Review of basic lung anatomy and physiology related to function, structure, and complications of lung disease. Review of risk factors.  Discussion about methods for diagnosing sleep apnea and types of masks and machines for OSA. Includes a review of the use of types of environmental controls: home humidity, furnaces, filters, dust mite/pet prevention, HEPA vacuums. Discussion about weather  changes, air quality and the benefits of nasal washing. Instruction on Warning signs, infection symptoms, calling MD promptly, preventive modes, and value of vaccinations. Review of effective airway clearance, coughing and/or vibration techniques. Emphasizing that all should Create an Action Plan. Written material given at graduation. Flowsheet Row Pulmonary Rehab from 01/21/2021 in [facility] Cardiac and Pulmonary Rehab  Education need identified 11/05/20  Date 12/31/20  Educator [facility]  Instruction Review Code 1- Verbalizes Understanding       AED/CPR: - Group verbal and written instruction with the use of models to demonstrate the basic use of the AED with the basic ABC's of resuscitation.    Anatomy and Cardiac Procedures: - Group verbal and visual presentation and models provide information about basic cardiac anatomy and function. Reviews the testing methods done to diagnose heart disease and the outcomes of the test results. Describes the treatment choices: Medical Management, Angioplasty, or Coronary Bypass Surgery for treating various heart conditions including Myocardial Infarction, Angina, Valve Disease, and Cardiac Arrhythmias.  Written material given at graduation. Flowsheet Row Pulmonary Rehab from 01/21/2021 in [facility] Cardiac and Pulmonary Rehab  Date 11/26/20  Educator [facility]  Instruction Review Code 1- Verbalizes Understanding       Medication Safety: - Group verbal and visual instruction to review commonly prescribed medications for heart and lung disease. Reviews the medication, class of the drug, and side effects. Includes the steps to properly store meds and maintain the prescription regimen.  Written material given at graduation. Flowsheet Row Pulmonary Rehab from 01/21/2021 in [facility] Cardiac and Pulmonary Rehab  Date 12/17/20  Educator [facility]  Instruction Review Code 1- Verbalizes Understanding       Other: -Provides group and verbal instruction on various topics (see  comments)   Knowledge Questionnaire Score:  Knowledge Questionnaire Score - 11/05/20 1514       Knowledge Questionnaire Score   Pre Score 15/18  oxygen and nutrition              Core Components/Risk Factors/Patient Goals at Admission:  Personal Goals and Risk Factors at Admission - 11/05/20 1514       Core Components/Risk Factors/Patient Goals on Admission    Weight Management Yes;Obesity;Weight Loss    Intervention Weight Management: Develop a combined nutrition and exercise program designed to reach desired caloric intake, while maintaining appropriate intake of nutrient and fiber, sodium and fats, and appropriate energy expenditure required for the weight goal.;Weight Management/Obesity: Establish reasonable short term and long term weight goals.;Weight Management: Provide education and appropriate resources to help participant work on and attain dietary goals.;Obesity: Provide education and appropriate resources to help participant work on and attain dietary goals.    Admit Weight 178 lb 8 oz (81 kg)    Goal Weight: Short Term 170 lb (77.1 kg)    Goal Weight: Long Term 165 lb (74.8 kg)    Expected Outcomes Short Term: Continue to assess and modify interventions until short term weight is achieved;Long Term: Adherence to nutrition and physical activity/exercise program aimed toward attainment of established weight goal;Weight Loss: Understanding of general recommendations for a balanced deficit meal plan, which promotes 1-2 lb weight loss per week and includes a negative energy balance of [phone] kcal/d;Understanding recommendations for meals to include 15-35% energy as protein, 25-35% energy  from fat, 35-60% energy from carbohydrates, less than 281m of dietary cholesterol, 20-35 gm of total fiber daily;Understanding of distribution of calorie intake throughout the day with the consumption of 4-5 meals/snacks    Improve shortness of breath with ADL's Yes    Intervention Provide  education, individualized exercise plan and daily activity instruction to help decrease symptoms of SOB with activities of daily living.    Expected Outcomes Short Term: Improve cardiorespiratory fitness to achieve a reduction of symptoms when performing ADLs;Long Term: Be able to perform more ADLs without symptoms or delay the onset of symptoms    Diabetes Yes    Intervention Provide education about signs/symptoms and action to take for hypo/hyperglycemia.;Provide education about proper nutrition, including hydration, and aerobic/resistive exercise prescription along with prescribed medications to achieve blood glucose in normal ranges: Fasting glucose 65-99 mg/dL    Expected Outcomes Short Term: Participant verbalizes understanding of the signs/symptoms and immediate care of hyper/hypoglycemia, proper foot care and importance of medication, aerobic/resistive exercise and nutrition plan for blood glucose control.;Long Term: Attainment of HbA1C < 7%.    Hypertension Yes    Intervention Provide education on lifestyle modifcations including regular physical activity/exercise, weight management, moderate sodium restriction and increased consumption of fresh fruit, vegetables, and low fat dairy, alcohol moderation, and smoking cessation.;Monitor prescription use compliance.    Expected Outcomes Short Term: Continued assessment and intervention until BP is < 140/966mHG in hypertensive participants. < 130/8071mG in hypertensive participants with diabetes, heart failure or chronic kidney disease.;Long Term: Maintenance of blood pressure at goal levels.    Lipids Yes    Intervention Provide education and support for participant on nutrition & aerobic/resistive exercise along with prescribed medications to achieve LDL <3m54mDL >40mg64m Expected Outcomes Short Term: Participant states understanding of desired cholesterol values and is compliant with medications prescribed. Participant is following exercise  prescription and nutrition guidelines.;Long Term: Cholesterol controlled with medications as prescribed, with individualized exercise RX and with personalized nutrition plan. Value goals: LDL < 3mg,81m > 40 mg.             Education:Diabetes - Individual verbal and written instruction to review signs/symptoms of diabetes, desired ranges of glucose level fasting, after meals and with exercise. Acknowledge that pre and post exercise glucose checks will be done for 3 sessions at entry of program. Flow[facility]  Date 11/05/20  Educator JH  [facility] Code 1- Verbalizes Understanding       Know Your Numbers and Heart Failure: - Group verbal and visual instruction to discuss disease risk factors for cardiac and pulmonary disease and treatment options.  Reviews associated critical values for Overweight/Obesity, Hypertension, Cholesterol, and Diabetes.  Discusses basics of heart failure: signs/symptoms and treatments.  Introduces Heart Failure Zone chart for action plan for heart failure.  Written material given at graduation. Flowsheet [facility]  Date 12/24/20  Educator MC  [facility] Review Code 1- Verbalizes Understanding       Core Components/Risk Factors/Patient Goals Review:   Goals and Risk Factor Review     Row Name 11/27/20 1339 12/17/20 1357 01/26/21 1354         Core Components/Risk Factors/Patient Goals Review   Personal Goals Review Improve shortness of breath with ADL's Improve shortness of breath with ADL's Improve shortness of breath with ADL's;Diabetes;Hypertension;Weight Management/Obesity     Review Spoke to patient  about their shortness of breath and what they can do to improve. Patient has been informed of breathing techniques when starting the program. Patient is informed to tell staff if they have had any med changes and that  certain meds they are taking or not taking can be causing shortness of breath. Practice pursed lip breathing- she keeps forgetting to use it and it feels unnatural. She does not wear a pulse ox at home to check o2 but is interested in getting one. She feels her shortness of breath has been about the same. She is taking all her medications and supplements as directed - she is having no problems. [place] is holding up. She is still practicing PLB when she feels SOB. She has good and bad days with her breathing. Her sugars are pretty stable for the most part, she was recently on prednisone which is making he sugars go a little high, however her doctor is aware. She checks her BP several times/ week and runs stable around 120s/ 60-70s. She is still taking all medications as prescribed. Her weight has been consistent; she did lose weight when she was sick recently and has now gained it back once her appetite came.     Expected Outcomes Short: Attend [facility] regularly to improve shortness of breath with ADL's. Long: maintain independence with ADL's Short: Attend [facility] regularly to improve shortness of breath with ADL's. Long: maintain independence with ADL's Short: Keep checking sugars and maintain normalcy Long: Continue to manage life style risk factors              Core Components/Risk Factors/Patient Goals at Discharge (Final Review):   Goals and Risk Factor Review - 01/26/21 1354       Core Components/Risk Factors/Patient Goals Review   Personal Goals Review Improve shortness of breath with ADL's;Diabetes;Hypertension;Weight Management/Obesity    Review [place] is holding up. She is still practicing PLB when she feels SOB. She has good and bad days with her breathing. Her sugars are pretty stable for the most part, she was recently on prednisone which is making he sugars go a little high, however her doctor is aware. She checks her BP several times/ week and runs stable around 120s/ 60-70s. She is  still taking all medications as prescribed. Her weight has been consistent; she did lose weight when she was sick recently and has now gained it back once her appetite came.    Expected Outcomes Short: Keep checking sugars and maintain normalcy Long: Continue to manage life style risk factors             ITP Comments:  ITP Comments     Row Name 10/29/20 1620 11/05/20 1507 11/10/20 1429 11/12/20 0743 12/10/20 0733   ITP Comments Virtual orientation call completed today. shehas an appointment on Date: 11/05/2020 for EP eval and gym Orientation.  Documentation of diagnosis can be found in [facility] Date: 10/01/2020. Completed 6MWT and gym orientation. Initial ITP created and sent for review to Dr. Zetta Bills, Medical Director. First full day of exercise!  Patient was oriented to gym and equipment including functions, settings, policies, and procedures.  Patient's individual exercise prescription and treatment plan were reviewed.  All starting workloads were established based on the results of the 6 minute walk test done at initial orientation visit.  The plan for exercise progression was also introduced and progression will be customized based on patient's performance and goals. 30 Day review completed. Medical Director ITP review done, changes made as directed,  and signed approval by Medical Director.  New to program 30 Day review completed. Medical Director ITP review done, changes made as directed, and signed approval by Medical Director.    [place] Name 01/05/21 1552 01/07/21 0722 02/04/21 0625       ITP Comments Lalena's husband just called and they both have tested positive for COVID today.  She will be able to return on 01/15/21 if feeling better. 30 Day review completed. Medical Director ITP review done, changes made as directed, and signed approval by Medical Director. 30 Day review completed. Medical Director ITP review done, changes made as directed, and signed approval by Medical Director.               Comments:

## 2021-02-04 NOTE — Progress Notes (Signed)
Daily Session Note  Patient Details  Name: Dorothy Rogers MRN: [id number] Date of Birth: 11/27/1950 Referring Provider:   Flowsheet Row Pulmonary Rehab from 11/05/2020 in [facility] Card[facility] and Pulmonary Rehab  Referring Provider Zetta Bills MD       Encounter Date: 02/04/2021  Check In:  Session Check In - 02/04/21 1413       Check-In   Supervising physician immediately available to respond to emergencies See telemetry face sheet for immediately available ER MD    Staff Present Nyoka Cowden, RN, BSN, Willette Pa, MA, RCEP, CCRP, Marylynn Pearson, MS, ASCM CEP, Exercise Physiologist;Joseph [place], [place]    Virtual Visit No    Medication changes reported     No    Fall or balance concerns reported    No    Tobacco Cessation No Change    Warm-up and Cool-down Performed on first and last piece of equipment      Pain Assessment   Currently in Pain? No/denies                Social History   Tobacco Use  Smoking Status Never  Smokeless Tobacco Never    Goals Met:  Independence with exercise equipment Exercise tolerated well No report of concerns or symptoms today  Goals Unmet:  Not Applicable  Comments: Pt able to follow exercise prescription today without complaint.  Will continue to monitor for progression.     Dr. Emily Filbert is Medical Director for [place].  Dr. Ottie Glazier is Medical Director for [facility] Pulmonary Rehabilitation.

## 2021-02-05 ENCOUNTER — Encounter: Payer: Medicare Other | Admitting: *Deleted

## 2021-02-05 DIAGNOSIS — I272 Pulmonary hypertension, unspecified: Secondary | ICD-10-CM | POA: Diagnosis not present

## 2021-02-05 NOTE — Progress Notes (Signed)
Daily Session Note  Patient Details  Name: Dorothy Rogers MRN: [id number] Date of Birth: May 24, 1951 Referring Provider:   Flowsheet Row Pulmonary Rehab from 11/05/2020 in [facility] Cardiac and Pulmonary Rehab  Referring Provider Zetta Bills MD       Encounter Date: 02/05/2021  Check In:  Session Check In - 02/05/21 1330       Check-In   Supervising physician immediately available to respond to emergencies See telemetry face sheet for immediately available ER MD    Location [facility]-Cardiac & Pulmonary Rehab    Staff Present Renita Papa, RN BSN;Joseph [place], RCP,RRT,BSRT;Jessica [place], [place], RCEP, CCRP, CCET    Virtual Visit No    Medication changes reported     No    Fall or balance concerns reported    No    Warm-up and Cool-down Performed on first and last piece of equipment    Resistance Training Performed Yes    VAD Patient? No    PAD/SET Patient? No      Pain Assessment   Currently in Pain? No/denies                Social History   Tobacco Use  Smoking Status Never  Smokeless Tobacco Never    Goals Met:  Independence with exercise equipment Exercise tolerated well No report of concerns or symptoms today Strength training completed today  Goals Unmet:  Not Applicable  Comments: Pt able to follow exercise prescription today without complaint.  Will continue to monitor for progression.    Dr. Emily Filbert is Medical Director for [place].  Dr. Ottie Glazier is Medical Director for [facility] Pulmonary Rehabilitation.

## 2021-02-06 ENCOUNTER — Other Ambulatory Visit: Payer: Self-pay | Admitting: Physical Medicine & Rehabilitation

## 2021-02-06 DIAGNOSIS — G8929 Other chronic pain: Secondary | ICD-10-CM

## 2021-02-09 ENCOUNTER — Other Ambulatory Visit: Payer: Self-pay

## 2021-02-09 DIAGNOSIS — I272 Pulmonary hypertension, unspecified: Secondary | ICD-10-CM | POA: Diagnosis not present

## 2021-02-09 NOTE — Progress Notes (Signed)
Daily Session Note  Patient Details  Name: Dorothy Rogers MRN: [id number] Date of Birth: May 21, 1951 Referring Provider:   Flowsheet Row Pulmonary Rehab from 11/05/2020 in [facility] Cardiac and Pulmonary Rehab  Referring Provider Zetta Bills MD       Encounter Date: 02/09/2021  Check In:  Session Check In - 02/09/21 1402       Check-In   Supervising physician immediately available to respond to emergencies See telemetry face sheet for immediately available ER MD    Location [facility]-Cardiac & Pulmonary Rehab    Staff Present Birdie Sons, MPA, Nino Glow, MS, ASCM CEP, Exercise Physiologist;Joseph Tessie Fass, [place]    Virtual Visit No    Medication changes reported     No    Fall or balance concerns reported    No    Tobacco Cessation No Change    Warm-up and Cool-down Performed on first and last piece of equipment    Resistance Training Performed Yes    VAD Patient? No    PAD/SET Patient? No      Pain Assessment   Currently in Pain? No/denies                Social History   Tobacco Use  Smoking Status Never  Smokeless Tobacco Never    Goals Met:  Independence with exercise equipment Exercise tolerated well No report of concerns or symptoms today Strength training completed today  Goals Unmet:  Not Applicable  Comments: Pt able to follow exercise prescription today without complaint.  Will continue to monitor for progression.    Dr. Emily Filbert is Medical Director for [place].  Dr. Ottie Glazier is Medical Director for [facility] Pulmonary Rehabilitation.

## 2021-02-11 ENCOUNTER — Other Ambulatory Visit: Payer: Self-pay

## 2021-02-11 DIAGNOSIS — I272 Pulmonary hypertension, unspecified: Secondary | ICD-10-CM | POA: Diagnosis not present

## 2021-02-11 NOTE — Progress Notes (Signed)
Daily Session Note  Patient Details  Name: Dorothy Rogers MRN: [id number] Date of Birth: Mar 13, 1951 Referring Provider:   Flowsheet Row Pulmonary Rehab from 11/05/2020 in [facility] Cardiac and Pulmonary Rehab  Referring Provider Zetta Bills MD       Encounter Date: 02/11/2021  Check In:  Session Check In - 02/11/21 1334       Check-In   Supervising physician immediately available to respond to emergencies See telemetry face sheet for immediately available ER MD    Location [facility]-Cardiac & Pulmonary Rehab    Staff Present Birdie Sons, MPA, Nino Glow, MS, ASCM CEP, Exercise Physiologist;Joseph Tessie Fass, [place]    Virtual Visit No    Medication changes reported     No    Fall or balance concerns reported    No    Tobacco Cessation No Change    Warm-up and Cool-down Performed on first and last piece of equipment    Resistance Training Performed Yes    VAD Patient? No    PAD/SET Patient? No      Pain Assessment   Currently in Pain? No/denies                Social History   Tobacco Use  Smoking Status Never  Smokeless Tobacco Never    Goals Met:  Independence with exercise equipment Exercise tolerated well No report of concerns or symptoms today Strength training completed today  Goals Unmet:  Not Applicable  Comments: Pt able to follow exercise prescription today without complaint.  Will continue to monitor for progression.    Dr. Emily Filbert is Medical Director for [place].  Dr. Ottie Glazier is Medical Director for [facility] Pulmonary Rehabilitation.

## 2021-02-13 ENCOUNTER — Other Ambulatory Visit: Payer: Self-pay

## 2021-02-13 ENCOUNTER — Ambulatory Visit
Admission: RE | Admit: 2021-02-13 | Discharge: 2021-02-13 | Disposition: A | Discharge status: Home / Self Care | Payer: Medicare Other | Source: Ambulatory Visit | Attending: Physical Medicine & Rehabilitation | Admitting: Physical Medicine & Rehabilitation

## 2021-02-13 DIAGNOSIS — M5442 Lumbago with sciatica, left side: Secondary | ICD-10-CM | POA: Insufficient documentation

## 2021-02-13 DIAGNOSIS — G8929 Other chronic pain: Secondary | ICD-10-CM | POA: Insufficient documentation

## 2021-02-16 ENCOUNTER — Encounter: Payer: Medicare Other | Admitting: *Deleted

## 2021-02-16 ENCOUNTER — Other Ambulatory Visit: Payer: Self-pay

## 2021-02-16 VITALS — Ht 62.5 in | Wt 179.1 lb

## 2021-02-16 DIAGNOSIS — I272 Pulmonary hypertension, unspecified: Secondary | ICD-10-CM | POA: Diagnosis not present

## 2021-02-16 NOTE — Progress Notes (Signed)
Daily Session Note  Patient Details  Name: Dorothy Rogers MRN: [id number] Date of Birth: 1951/01/01 Referring Provider:   Flowsheet Row Pulmonary Rehab from 11/05/2020 in [facility] Cardiac and Pulmonary Rehab  Referring Provider Zetta Bills MD       Encounter Date: 02/16/2021  Check In:  Session Check In - 02/16/21 1333       Check-In   Supervising physician immediately available to respond to emergencies See telemetry face sheet for immediately available ER MD    Location [facility]-Cardiac & Pulmonary Rehab    Staff Present Renita Papa, RN BSN;Joseph [place], RCP,RRT,BSRT;Kara [place], [place], ASCM CEP, Exercise Physiologist    Virtual Visit No    Medication changes reported     No    Fall or balance concerns reported    No    Warm-up and Cool-down Performed on first and last piece of equipment    Resistance Training Performed Yes    VAD Patient? No    PAD/SET Patient? No      Pain Assessment   Currently in Pain? No/denies                Social History   Tobacco Use  Smoking Status Never  Smokeless Tobacco Never    Goals Met:  Independence with exercise equipment Exercise tolerated well No report of concerns or symptoms today Strength training completed today  Goals Unmet:  Not Applicable  Comments: Pt able to follow exercise prescription today without complaint.  Will continue to monitor for progression.    Dr. Emily Filbert is Medical Director for [place].  Dr. Ottie Glazier is Medical Director for [facility] Pulmonary Rehabilitation.

## 2021-02-18 ENCOUNTER — Other Ambulatory Visit: Payer: Self-pay

## 2021-02-18 DIAGNOSIS — I272 Pulmonary hypertension, unspecified: Secondary | ICD-10-CM | POA: Diagnosis not present

## 2021-02-18 NOTE — Progress Notes (Signed)
Daily Session Note  Patient Details  Name: KYNDRA CONDRON MRN: [id number] Date of Birth: 05/30/1951 Referring Provider:   Flowsheet Row Pulmonary Rehab from 11/05/2020 in [facility] Cardiac and Pulmonary Rehab  Referring Provider Zetta Bills MD       Encounter Date: 02/18/2021  Check In:  Session Check In - 02/18/21 1333       Check-In   Supervising physician immediately available to respond to emergencies See telemetry face sheet for immediately available ER MD    Location [facility]-Cardiac & Pulmonary Rehab    Staff Present Birdie Sons, MPA, Nino Glow, MS, ASCM CEP, Exercise Physiologist;Joseph Tessie Fass, [place]    Virtual Visit No    Medication changes reported     No    Fall or balance concerns reported    No    Tobacco Cessation No Change    Warm-up and Cool-down Performed on first and last piece of equipment    Resistance Training Performed Yes    VAD Patient? No    PAD/SET Patient? No      Pain Assessment   Currently in Pain? No/denies                Social History   Tobacco Use  Smoking Status Never  Smokeless Tobacco Never    Goals Met:  Independence with exercise equipment Exercise tolerated well Personal goals reviewed No report of concerns or symptoms today Strength training completed today  Goals Unmet:  Not Applicable  Comments: Pt able to follow exercise prescription today without complaint.  Will continue to monitor for progression.    Dr. Emily Filbert is Medical Director for [place].  Dr. Ottie Glazier is Medical Director for [facility] Pulmonary Rehabilitation.

## 2021-02-19 ENCOUNTER — Other Ambulatory Visit: Payer: Self-pay

## 2021-02-19 ENCOUNTER — Encounter: Payer: Medicare Other | Admitting: *Deleted

## 2021-02-19 DIAGNOSIS — I272 Pulmonary hypertension, unspecified: Secondary | ICD-10-CM

## 2021-02-19 NOTE — Progress Notes (Signed)
Daily Session Note  Patient Details  Name: Dorothy Rogers MRN: [id number] Date of Birth: March 16, 1951 Referring Provider:   Flowsheet Row Pulmonary Rehab from 11/05/2020 in [facility] and [facility]monary Rehab  Referring Provider Zetta Bills MD       Encounter Date: 02/19/2021  Check In:  Session Check In - 02/19/21 1331       Check-In   Supervising physician immediately available to respond to emergencies See telemetry face sheet for immediately available ER MD    Location [facility]-Cardiac & Pulmonary Rehab    Staff Present Renita Papa, RN BSN;Melissa [place], RDN, LDN;Jessica Luan [place], MA, RCEP, CCRP, CCET    Virtual Visit No    Medication changes reported     No    Fall or balance concerns reported    No    Warm-up and Cool-down Performed on first and last piece of equipment    Resistance Training Performed Yes    VAD Patient? No    PAD/SET Patient? No      Pain Assessment   Currently in Pain? No/denies                Social History   Tobacco Use  Smoking Status Never  Smokeless Tobacco Never    Goals Met:  Independence with exercise equipment Exercise tolerated well No report of concerns or symptoms today Strength training completed today  Goals Unmet:  Not Applicable  Comments: Pt able to follow exercise prescription today without complaint.  Will continue to monitor for progression.    Dr. Emily Filbert is Medical Director for [place].  Dr. Ottie Glazier is Medical Director for [facility] Pulmonary Rehabilitation.

## 2021-02-23 ENCOUNTER — Other Ambulatory Visit: Payer: Self-pay

## 2021-02-23 DIAGNOSIS — I272 Pulmonary hypertension, unspecified: Secondary | ICD-10-CM | POA: Diagnosis not present

## 2021-02-23 NOTE — Progress Notes (Signed)
Daily Session Note  Patient Details  Name: Dorothy Rogers MRN: [id number] Date of Birth: [birth year] Referring Provider:   Flowsheet Row Pulmonary Rehab from 11/05/2020 in [facility] Cardiac and Pulmonary Rehab  Referring Provider Zetta Bills MD       Encounter Date: 02/23/2021  Check In:  Session Check In - 02/23/21 1330       Check-In   Supervising physician immediately available to respond to emergencies See telemetry face sheet for immediately available ER MD    Location [facility]-Cardiac & Pulmonary Rehab    Staff Present Birdie Sons, MPA, Nino Glow, MS, ASCM CEP, Exercise Physiologist;Amanda Oletta Darter, BA, ACSM CEP, Exercise Physiologist    Virtual Visit No    Medication changes reported     Yes    Comments restarted plavix post steroid injection    Fall or balance concerns reported    No    Tobacco Cessation No Change    Warm-up and Cool-down Performed on first and last piece of equipment    Resistance Training Performed Yes    VAD Patient? No    PAD/SET Patient? No      Pain Assessment   Currently in Pain? No/denies                Social History   Tobacco Use  Smoking Status Never  Smokeless Tobacco Never    Goals Met:  Independence with exercise equipment Exercise tolerated well No report of concerns or symptoms today Strength training completed today  Goals Unmet:  Not Applicable  Comments: Pt able to follow exercise prescription today without complaint.  Will continue to monitor for progression.    Dr. Emily Filbert is Medical Director for [place].  Dr. Ottie Glazier is Medical Director for [facility] Pulmonary Rehabilitation.

## 2021-02-26 ENCOUNTER — Encounter: Payer: Self-pay | Admitting: *Deleted

## 2021-02-26 DIAGNOSIS — I272 Pulmonary hypertension, unspecified: Secondary | ICD-10-CM

## 2021-02-26 NOTE — Progress Notes (Signed)
Pulmonary Individual Treatment Plan  Patient Details  Name: Dorothy Rogers MRN: [id number] Date of Birth: [birth year] Referring Provider:   Flowsheet Row Pulmonary Rehab from 11/05/2020 in [facility] Cardiac and Pulmonary Rehab  Referring Provider Zetta Bills MD       Initial Encounter Date:  Flowsheet Row Pulmonary Rehab from 11/05/2020 in [facility] Cardiac and Pulmonary Rehab  Date 11/05/20       Visit Diagnosis: Pulmonary hypertension ([place])  Patient's Home Medications on Admission:  Current Outpatient Medications:    acetaminophen (TYLENOL) 500 MG tablet, Take 1,000 mg by mouth every 6 (six) hours as needed for mild pain or moderate pain. , Disp: , Rfl:    acyclovir (ZOVIRAX) 400 MG tablet, Take 400 mg by mouth daily as needed (for fever blisters). , Disp: , Rfl:    aspirin EC 81 MG EC tablet, Take 1 tablet (81 mg total) by mouth daily. (Patient taking differently: Take 81 mg by mouth at bedtime.), Disp: , Rfl:    atorvastatin (LIPITOR) 40 MG tablet, Take 1 tablet by mouth once daily, Disp: , Rfl:    Biotin 10 MG TABS, Take by mouth., Disp: , Rfl:    Calcium Carb-Cholecalciferol (CALTRATE 600+D3 PO), Take 1 tablet by mouth daily., Disp: , Rfl:    Calcium Carbonate-Vitamin D 600-400 MG-UNIT tablet, Take by mouth. (Patient not taking: Reported on 10/29/2020), Disp: , Rfl:    CARTIA XT 120 MG 24 hr capsule, Take 120 mg by mouth daily., Disp: , Rfl: 3   Cholecalciferol (VITAMIN D-3) 125 MCG (5000 UT) TABS, Take 5,000 Units by mouth daily., Disp: , Rfl:    clopidogrel (PLAVIX) 75 MG tablet, Take 1 tablet by mouth once daily, Disp: , Rfl:    conjugated estrogens (PREMARIN) vaginal cream, Place 0.5 g vaginally 2 (two) times a week. (Patient not taking: Reported on 10/29/2020), Disp: , Rfl:    Cyanocobalamin (VITAMIN B-12) 5000 MCG SUBL, Place 5,000 mcg under the tongue daily., Disp: , Rfl:    diazepam (VALIUM) 2 MG tablet, Take by mouth., Disp: , Rfl:    ferrous sulfate 325 (65 FE) MG tablet, Take  325 mg by mouth daily with breakfast., Disp: , Rfl:    gabapentin (NEURONTIN) 100 MG capsule, Take by mouth., Disp: , Rfl:    glipiZIDE (GLUCOTROL) 10 MG tablet, Take 10 mg by mouth 2 (two) times daily. Before breakfast & before supper, Disp: , Rfl:    ibuprofen (ADVIL) 200 MG tablet, Take 400 mg by mouth every 8 (eight) hours as needed (for pain.)., Disp: , Rfl:    insulin glargine (LANTUS SOLOSTAR) 100 UNIT/ML Solostar Pen, INJECT 36 TO 50 UNITS SUBCUTANEOUSLY ONCE DAILY AS DIRECTED, Disp: , Rfl:    insulin lispro (HUMALOG) 100 UNIT/ML injection, Inject into the skin 3 (three) times daily before meals. Sliding scale, Disp: , Rfl:    loperamide (IMODIUM) 2 MG capsule, Take by mouth., Disp: , Rfl:    losartan (COZAAR) 100 MG tablet, Take 1 tablet by mouth once daily, Disp: , Rfl:    melatonin 1 MG TABS tablet, Take by mouth. (Patient not taking: Reported on 10/29/2020), Disp: , Rfl:    ondansetron (ZOFRAN-ODT) 8 MG disintegrating tablet, Take 8 mg by mouth as needed for nausea or vomiting., Disp: , Rfl:    pantoprazole (PROTONIX) 20 MG tablet, Take by mouth., Disp: , Rfl:    Probiotic CAPS, Take 1 capsule by mouth daily., Disp: , Rfl:    promethazine (PHENERGAN) 12.5 MG tablet, Take 1  tablet (12.5 mg total) by mouth every 6 (six) hours as needed for nausea or vomiting., Disp: 15 tablet, Rfl: 0 No current facility-administered medications for this visit.  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   Cataract    Chronic kidney disease    Stage III   Coronary artery disease    DDD (degenerative disc  disease), lumbar    Diabetes mellitus without complication ([place])    TYPE 2   Herpes simplex    Hyperlipidemia    Hypertension    CONTROLLED ON MEDS   Vertigo    occasional, no episodes in 2-3 months   Vertigo    OCCASIONALLY   Vitamin B 12 deficiency     Tobacco Use: Social History   Tobacco Use  Smoking Status Never  Smokeless Tobacco Never    Labs: Recent Review Flowsheet Data     Labs for ITP Cardiac and Pulmonary Rehab Latest Ref Rng & Units 06/09/2015 12/01/2015   Hemoglobin A1c 4.0 - 6.0 % 6.5(H) -   PHART 7.350 - 7.450 - 7.67(HH)   PCO2ART 32.0 - 48.0 mmHg - 17(LL)        Pulmonary Assessment Scores:  Pulmonary Assessment Scores     Row Name 10/29/20 1609 11/05/20 1517 02/16/21 1520     ADL UCSD   ADL Phase Entry Entry Exit   SOB Score total 88 88 --   Rest 2 2 --   Walk 4 4 --   Stairs 4 4 --   Bath 5 5 --   Dress 4 4 --   Shop 4 4 --     CAT Score   CAT Score 15 15 --     mMRC Score   mMRC Score -- 4 2    Row Name 02/23/21 1347         ADL UCSD   ADL Phase Exit     SOB Score total 79     Rest 1     Walk 4     Stairs 5     Bath 5     Dress 4     Shop 4           CAT Score   CAT Score 17           mMRC Score   mMRC Score 2              UCSD: Self-administered rating of dyspnea associated with activities of daily living (ADLs) 6-point scale (0 = "not at all" to 5 = "maximal or unable to do because of breathlessness")  Scoring Scores range from 0 to 120.  Minimally important difference is 5 units  CAT: CAT can identify the health impairment of COPD patients and is better correlated with disease progression.  CAT has a scoring range of zero to 40. The CAT score is classified into four groups of low (less than 10), medium (10 - 20), high (21-30) and very high (31-40) based on the impact level of disease on health status.  A CAT score over 10 suggests significant symptoms.  A worsening CAT score could be explained by an  exacerbation, poor medication adherence, poor inhaler technique, or progression of COPD or comorbid conditions.  CAT MCID is 2 points  mMRC: mMRC (Modified Medical Research Council) Dyspnea Scale is used to assess the degree of baseline functional disability in patients of respiratory disease due to dyspnea. No minimal important difference is established. A decrease in score of 1 point or greater is considered a positive change.   Pulmonary Function Assessment:  Pulmonary Function Assessment - 11/05/20 1516       Breath   Shortness of Breath Yes;Panic with Shortness of Breath;Fear of Shortness of Breath;Limiting activity             Exercise Target Goals: Exercise Program Goal: Individual exercise prescription set using results from initial 6 min walk test and THRR while considering  patient's activity barriers and safety.   Exercise Prescription Goal: Initial exercise prescription builds to 30-45 minutes a day of aerobic activity, 2-3 days per week.  Home exercise guidelines will be given to patient during program as part of exercise prescription that the participant will acknowledge.  Education: Aerobic Exercise: - Group verbal and visual presentation on the components of exercise prescription. Introduces F.I.T.T principle from ACSM for exercise prescriptions.  Reviews F.I.T.T. principles of aerobic exercise including progression. Written material given at graduation. Flowsheet Row Pulmonary Rehab from 01/21/2021 in [facility] Cardiac and Pulmonary Rehab  Date 01/21/21  Educator AS  Instruction Review Code 1- Verbalizes Understanding       Education: Resistance Exercise: - Group verbal and visual presentation on the components of exercise prescription. Introduces F.I.T.T principle from ACSM for exercise prescriptions  Reviews F.I.T.T. principles of resistance exercise including progression. Written material given at graduation. Flowsheet Row Pulmonary Rehab from 01/21/2021 in [facility]  Cardiac and Pulmonary Rehab  Date 11/26/20  Educator [place]  Instruction Review Code 1- Verbalizes Understanding        Education: Exercise & Equipment Safety: - Individual verbal instruction and demonstration of equipment use and safety with use of the equipment. Flowsheet Row Pulmonary Rehab from 01/21/2021 in [facility] Cardiac and Pulmonary Rehab  Date 11/05/20  Educator [facility]  Instruction Review Code 1- Verbalizes Understanding       Education: Exercise Physiology & General Exercise Guidelines: - Group verbal and written instruction with models to review the exercise physiology of the cardiovascular system and associated critical values. Provides general exercise guidelines with specific guidelines to those with heart or lung disease.  Flowsheet Row Pulmonary Rehab from 01/21/2021 in [facility] Cardiac and Pulmonary Rehab  Date 11/12/20  Educator AS  Instruction Review Code 1- Verbalizes Understanding       Education: Flexibility, Balance, Mind/Body Relaxation: - Group verbal and visual presentation with interactive activity on the components of exercise prescription. Introduces F.I.T.T principle from ACSM for exercise prescriptions. Reviews F.I.T.T. principles of flexibility and balance exercise training including progression. Also discusses the mind body connection.  Reviews various relaxation techniques to help reduce and manage stress (i.e. Deep breathing, progressive muscle relaxation, and visualization). Balance handout provided to take home. Written material given at graduation. Flowsheet Row Pulmonary Rehab from 01/21/2021 in [facility] Cardiac and Pulmonary Rehab  Date 12/03/20  Educator [facility]  Instruction Review Code 1- Verbalizes Understanding       Activity Barriers & Risk Stratification:  Activity Barriers & Cardiac Risk Stratification - 11/05/20 1510       Activity Barriers & Cardiac Risk Stratification  Activity Barriers Arthritis;Back Problems;Neck/Spine Problems;Muscular  Weakness;Deconditioning;Balance Concerns;Shortness of Breath;Assistive Device;Joint Problems             6 Minute Walk:  6 Minute Walk     Row Name 11/05/20 1508 02/16/21 1517       6 Minute Walk   Phase Initial Discharge    Distance 296 feet 565 feet    Distance % Change -- 90.8 %    Distance Feet Change -- 269 ft    Walk Time 4.88 minutes 5.8 minutes    # of Rest Breaks 5  10 sec, 17 sec, 12 sec, 10 sec, stopped at 5:32 1  3:50-4:22    MPH 0.67 1.07    METS 1.24 1.74    RPE 15 13    Perceived Dyspnea  3 3    VO2 Peak 4.35 6.12    Symptoms Yes (comment) Yes (comment)    Comments SOB, chronic hip pain 3/10 Hip pain 6/10, SOB    Resting HR 95 bpm 100 bpm    Resting BP 132/64 142/70    Resting Oxygen Saturation  98 % 100 %    Exercise Oxygen Saturation  during 6 min walk 98 % 98 %    Max Ex. HR 128 bpm 132 bpm    Max Ex. BP 146/72 146/64    2 Minute Post BP 130/64 118/62         Interval HR   1 Minute HR 127 125    2 Minute HR 128 132    3 Minute HR 127 128    4 Minute HR 123 124    5 Minute HR 124 129    6 Minute HR 113 129    2 Minute Post HR 106 113    Interval Heart Rate? Yes Yes         Interval Oxygen   Interval Oxygen? Yes Yes    Baseline Oxygen Saturation % 98 % 100 %    1 Minute Oxygen Saturation % 98 % 99 %    1 Minute Liters of Oxygen 0 L  Room Air 0 L  RA    2 Minute Oxygen Saturation % 99 % 98 %    2 Minute Liters of Oxygen 0 L 0 L    3 Minute Oxygen Saturation % 99 % 99 %    3 Minute Liters of Oxygen 0 L 0 L    4 Minute Oxygen Saturation % 98 % 8 %    4 Minute Liters of Oxygen 0 L 0 L    5 Minute Oxygen Saturation % 98 % 99 %    5 Minute Liters of Oxygen 0 L 0 L    6 Minute Oxygen Saturation % 98 % 99 %    6 Minute Liters of Oxygen 0 L 0 L    2 Minute Post Oxygen Saturation % 98 % --    2 Minute Post Liters of Oxygen 0 L --            Oxygen Initial Assessment:  Oxygen Initial Assessment - 01/26/21 1520       Home Oxygen   Home  Oxygen Device None    Sleep Oxygen Prescription None    Home Exercise Oxygen Prescription None    Home Resting Oxygen Prescription None    Compliance with Home Oxygen Use Yes      Initial 6 min Walk   Oxygen Used None      Program Oxygen Prescription  Program Oxygen Prescription None      Intervention   Short Term Goals To learn and demonstrate proper pursed lip breathing techniques or other breathing techniques.     Long  Term Goals Exhibits proper breathing techniques, such as pursed lip breathing or other method taught during program session             Oxygen Re-Evaluation:  Oxygen Re-Evaluation     [place] Name 11/27/20 1337 12/17/20 1354 01/26/21 1520 02/18/21 1345       Program Oxygen Prescription   Program Oxygen Prescription None None -- None         Home Oxygen   Home Oxygen Device None None -- None    Sleep Oxygen Prescription None None -- None    Home Exercise Oxygen Prescription None None -- None    Home Resting Oxygen Prescription None None -- None    Compliance with Home Oxygen Use Yes Yes -- Yes         Goals/Expected Outcomes   Short Term Goals To learn and demonstrate proper pursed lip breathing techniques or other breathing techniques.  To learn and demonstrate proper pursed lip breathing techniques or other breathing techniques.  -- To learn and demonstrate proper pursed lip breathing techniques or other breathing techniques.     Long  Term Goals Exhibits proper breathing techniques, such as pursed lip breathing or other method taught during program session Exhibits proper breathing techniques, such as pursed lip breathing or other method taught during program session -- Exhibits proper breathing techniques, such as pursed lip breathing or other method taught during program session    Comments Informed patient how to perform the Pursed Lipped breathing technique. Told patient to Inhale through the nose and out the mouth with pursed lips to keep their  airways open, help oxygenate them better, practice when at rest or doing strenuous activity. Patient Verbalizes understanding of technique and will work on and be reiterated during [place]. Practice pursed lip breathing- she keeps forgetting to use it and it feels unnatural. She does not wear a pulse ox at home to check o2 but is interested in getting one. She feels her shortness of breath has been about the same. Dorothy Rogers still has yet to get a pulse ox and we went over where she can get one. She recently recovering from Covid and is curious if anything she changed. She is aware her O2 needs to stay above 88%. She is continuing PLB especially when she has worse breathing days Dorothy Rogers feels back to her baseline since she had covid a little while ago. She is still utilizing PLB when needed. She plans to get a pulse ox to use at home when she exercise. She is going to graduate next week.    Goals/Expected Outcomes Short: use PLB with exertion. Long: use PLB on exertion proficiently and independently. Short: use PLB with exertion, get pulse ox. Long: use PLB on exertion proficiently and independently. Short: use PLB with exertion, get pulse ox. Long: use PLB on exertion proficiently and independently. Short: Graduate Long: Use PLB efficiently             Oxygen Discharge (Final Oxygen Re-Evaluation):  Oxygen Re-Evaluation - 02/18/21 1345       Program Oxygen Prescription   Program Oxygen Prescription None      Home Oxygen   Home Oxygen Device None    Sleep Oxygen Prescription None    Home Exercise Oxygen Prescription None    Home  Resting Oxygen Prescription None    Compliance with Home Oxygen Use Yes      Goals/Expected Outcomes   Short Term Goals To learn and demonstrate proper pursed lip breathing techniques or other breathing techniques.     Long  Term Goals Exhibits proper breathing techniques, such as pursed lip breathing or other method taught during program session    Comments Dorothy Rogers  feels back to her baseline since she had covid a little while ago. She is still utilizing PLB when needed. She plans to get a pulse ox to use at home when she exercise. She is going to graduate next week.    Goals/Expected Outcomes Short: Graduate Long: Use PLB efficiently             Initial Exercise Prescription:  Initial Exercise Prescription - 11/05/20 1500       Date of Initial Exercise RX and Referring Provider   Date 11/05/20    Referring Provider Zetta Bills MD      Recumbant Bike   Level 1    RPM 50    Watts 5    Minutes 15    METs 2      NuStep   Level 1    SPM 80    Minutes 15    METs 2      REL-XR   Level 1    Speed 50    Minutes 15    METs 2      Track   Laps 8    Minutes 15    METs 1.4      Prescription Details   Frequency (times per week) 3    Duration Progress to 30 minutes of continuous aerobic without signs/symptoms of physical distress      Intensity   THRR 40-80% of Max Heartrate 117-139    Ratings of Perceived Exertion 11-13    Perceived Dyspnea 0-4      Progression   Progression Continue to progress workloads to maintain intensity without signs/symptoms of physical distress.      Resistance Training   Training Prescription Yes    Weight 3 lb    Reps 10-15             Perform Capillary Blood Glucose checks as needed.  Exercise Prescription Changes:   Exercise Prescription Changes     Row Name 11/05/20 1500 11/18/20 1500 12/04/20 0800 12/17/20 1400 12/18/20 1400     Response to Exercise   Blood Pressure (Admit) 132/64 132/82 130/62 120/60 --   Blood Pressure (Exercise) 146/72 142/80 174/78 -- --   Blood Pressure (Exit) 104/62 134/62 112/70 102/60 --   Heart Rate (Admit) 95 bpm 100 bpm 96 bpm 100 bpm --   Heart Rate (Exercise) 128 bpm 101 bpm 116 bpm 115 bpm --   Heart Rate (Exit) 94 bpm 106 bpm 106 bpm 105 bpm --   Oxygen Saturation (Admit) 98 % 93 % 96 % 98 % --   Oxygen Saturation (Exercise) 98 % 98 % 98 % 98 %  --   Oxygen Saturation (Exit) 98 % 99 % 99 % 99 % --   Rating of Perceived Exertion (Exercise) _0 --   Perceived Dyspnea (Exercise) _1 --   Symptoms SOB, hip pain (3/10) SOB SOB SOB, back pain --   Comments walk test results -- -- -- --   Duration -- Progress to 30 minutes of  aerobic without signs/symptoms of physical distress Progress to  30 minutes of  aerobic without signs/symptoms of physical distress Continue with 30 min of aerobic exercise without signs/symptoms of physical distress. --   Intensity -- THRR unchanged THRR unchanged THRR unchanged --     Progression   Progression -- Continue to progress workloads to maintain intensity without signs/symptoms of physical distress. Continue to progress workloads to maintain intensity without signs/symptoms of physical distress. Continue to progress workloads to maintain intensity without signs/symptoms of physical distress. --   Average METs -- 1.83 2.44 2.6 --     Resistance Training   Training Prescription -- Yes Yes Yes --   Weight -- 3 lb 3 lb 3 lb --   Reps -- 10-15 10-15 10-15 --     Interval Training   Interval Training -- No No No --     NuStep   Level -- 1 -- -- --   Minutes -- 15 -- -- --   METs -- 1.8 -- -- --     REL-XR   Level -- _0 --   Minutes -- _1 --   METs -- 1.9 3.2 3.3 --     T5 Nustep   Level -- _2 --   Minutes -- _3 --   METs -- 1.8 1.8 1.8 --     Track   Laps -- -- 4 -- --   Minutes -- -- 15 -- --   METs -- -- 1.2 -- --     Home Exercise Plan   Plans to continue exercise at -- -- -- -- Home (comment)  Recumbant bike, [employer] staff videos   Frequency -- -- -- -- Add 3 additional days to program exercise sessions.  Start with 1   Initial Home Exercises Provided -- -- -- -- 12/18/20    Row Name 12/30/20 1300 01/14/21 0900 01/26/21 1100 02/09/21 1500 02/23/21 1000     Response to Exercise   Blood Pressure (Admit) 142/68 122/70 134/76 124/60 130/62   Blood Pressure  (Exercise) -- 162/58 -- -- --   Blood Pressure (Exit) 124/64 132/64 102/60 126/56 124/62   Heart Rate (Admit) 108 bpm 90 bpm 93 bpm 89 bpm 93 bpm   Heart Rate (Exercise) 106 bpm 117 bpm 99 bpm 93 bpm 109 bpm   Heart Rate (Exit) 99 bpm 90 bpm 87 bpm 86 bpm 96 bpm   Oxygen Saturation (Admit) 98 % 98 % 98 % 98 % 98 %   Oxygen Saturation (Exercise) 97 % 98 % 95 % 94 % 97 %   Oxygen Saturation (Exit) 97 % 98 % 99 % 97 % 99 %   Rating of Perceived Exertion (Exercise) _4 Perceived Dyspnea (Exercise) _5 Symptoms none SOB SOB SOB, back pain SOB   Duration Continue with 30 min of aerobic exercise without signs/symptoms of physical distress. Continue with 30 min of aerobic exercise without signs/symptoms of physical distress. Continue with 30 min of aerobic exercise without signs/symptoms of physical distress. Continue with 30 min of aerobic exercise without signs/symptoms of physical distress. Continue with 30 min of aerobic exercise without signs/symptoms of physical distress.   Intensity _6      Progression   Progression Continue to progress workloads to maintain intensity without signs/symptoms of physical distress. Continue to progress workloads to maintain intensity without signs/symptoms of physical distress. Continue to progress workloads to maintain  intensity without signs/symptoms of physical distress. Continue to progress workloads to maintain intensity without signs/symptoms of physical distress. Continue to progress workloads to maintain intensity without signs/symptoms of physical distress.   Average METs 2.4 2.16 1.5 2.7 2.75     Resistance Training   Training Prescription _0    Weight 3 lb 3 lb 3 lb 3 lb 3 lb   Reps 10-15 10-15 10-15 10-15 10-15     Interval Training   Interval Training _1      REL-XR   Level -- 1 -- 1 1   Minutes -- 30 -- 30 30   METs -- 3 -- 3.6  2.75     T5 Nustep   Level _2 --   Minutes _3 --   METs 2.4 1.8 1.8 1.8 --     Track   Laps -- 4 -- -- --   Minutes -- 15 -- -- --   METs -- 1.2 -- -- --     Home Exercise Plan   Plans to continue exercise at -- Home (comment)  Recumbant bike, [employer] staff videos Home (comment)  Recumbant bike, [employer] staff videos Home (comment)  Recumbant bike, [employer] staff videos Home (comment)  Recumbant bike, [employer] staff videos   Frequency -- Add 3 additional days to program exercise sessions.  Start with 1 Add 3 additional days to program exercise sessions.  Start with 1 Add 3 additional days to program exercise sessions.  Start with 1 Add 3 additional days to program exercise sessions.  Start with 1   Initial Home Exercises Provided -- 12/18/20 12/18/20 12/18/20 12/18/20            Exercise Comments:   Exercise Comments     Row Name 11/10/20 1429           Exercise Comments First full day of exercise!  Patient was oriented to gym and equipment including functions, settings, policies, and procedures.  Patient's individual exercise prescription and treatment plan were reviewed.  All starting workloads were established based on the results of the 6 minute walk test done at initial orientation visit.  The plan for exercise progression was also introduced and progression will be customized based on patient's performance and goals.                Exercise Goals and Review:   Exercise Goals     Row Name 11/05/20 1512             Exercise Goals   Increase Physical Activity Yes       Intervention Provide advice, education, support and counseling about physical activity/exercise needs.;Develop an individualized exercise prescription for aerobic and resistive training based on initial evaluation findings, risk stratification, comorbidities and participant's personal goals.       Expected Outcomes Long Term: Add in home exercise to make exercise part of routine and to  increase amount of physical activity.;Short Term: Attend rehab on a regular basis to increase amount of physical activity.;Long Term: Exercising regularly at least 3-5 days a week.       Increase Strength and Stamina Yes       Intervention Provide advice, education, support and counseling about physical activity/exercise needs.;Develop an individualized exercise prescription for aerobic and resistive training based on initial evaluation findings, risk stratification, comorbidities and participant's personal goals.       Expected Outcomes Short Term: Increase workloads from initial exercise prescription  for resistance, speed, and METs.;Short Term: Perform resistance training exercises routinely during rehab and add in resistance training at home;Long Term: Improve cardiorespiratory fitness, muscular endurance and strength as measured by increased METs and functional capacity (6MWT)       Able to understand and use rate of perceived exertion (RPE) scale Yes       Intervention Provide education and explanation on how to use RPE scale       Expected Outcomes Short Term: Able to use RPE daily in rehab to express subjective intensity level;Long Term:  Able to use RPE to guide intensity level when exercising independently       Able to understand and use Dyspnea scale Yes       Intervention Provide education and explanation on how to use Dyspnea scale       Expected Outcomes Short Term: Able to use Dyspnea scale daily in rehab to express subjective sense of shortness of breath during exertion;Long Term: Able to use Dyspnea scale to guide intensity level when exercising independently       Knowledge and understanding of Target Heart Rate Range (THRR) Yes       Intervention Provide education and explanation of THRR including how the numbers were predicted and where they are located for reference       Expected Outcomes Short Term: Able to state/look up THRR;Short Term: Able to use daily as guideline for intensity  in rehab;Long Term: Able to use THRR to govern intensity when exercising independently       Able to check pulse independently Yes       Intervention Provide education and demonstration on how to check pulse in carotid and radial arteries.;Review the importance of being able to check your own pulse for safety during independent exercise       Expected Outcomes Short Term: Able to explain why pulse checking is important during independent exercise;Long Term: Able to check pulse independently and accurately       Understanding of Exercise Prescription Yes       Intervention Provide education, explanation, and written materials on patient's individual exercise prescription       Expected Outcomes Short Term: Able to explain program exercise prescription;Long Term: Able to explain home exercise prescription to exercise independently                Exercise Goals Re-Evaluation :  Exercise Goals Re-Evaluation     Row Name 11/10/20 1429 11/18/20 1517 12/04/20 0833 12/17/20 1358 12/17/20 1451     Exercise Goal Re-Evaluation   Exercise Goals Review Able to understand and use rate of perceived exertion (RPE) scale;Able to understand and use Dyspnea scale;Knowledge and understanding of Target Heart Rate Range (THRR);Understanding of Exercise Prescription Increase Physical Activity;Increase Strength and Stamina;Understanding of Exercise Prescription Increase Physical Activity;Increase Strength and Stamina;Understanding of Exercise Prescription Increase Physical Activity;Increase Strength and Stamina;Understanding of Exercise Prescription Increase Physical Activity;Increase Strength and Stamina;Understanding of Exercise Prescription   Comments Reviewed RPE and dyspnea scales, THR and program prescription with pt today.  Pt voiced understanding and was given a copy of goals to take home. Dorothy Rogers is doing well in rehab.  She has completed her first four full exercise sessions.  We will start to increase her  workloads and continue to monitor her progress. Dorothy Rogers is doing well. Her O2 sats remain in appropriate range while exercising and has now tried to walk the track with her walker in which she should build up tolerance overtime. Will continue  to monitor her progress. She reports being limited due to her back and hip. She stays active around the house. EP still needs to go over home exercise. Dorothy Rogers walked some today using the handrail.  She is limited by her back still.  She is up to 2.8 METs on the XR.  We will continue to monitor her progress.   Expected Outcomes Short: Use RPE daily to regulate intensity. Long: Follow program prescription in THR. Short: Increase workloads Long: Continue to improve stamina Short: Increase number of laps on track Long: Continue to increase overall MET level Short: EP to go over home exercise Long: Continue to increase overall MET level Short: Increase walking stamina Long: Continue to improve stamina    Row Name 12/18/20 1406 12/30/20 1352 01/14/21 0959 01/26/21 1105 01/26/21 1342     Exercise Goal Re-Evaluation   Exercise Goals Review Increase Physical Activity;Increase Strength and Stamina;Understanding of Exercise Prescription Increase Physical Activity;Increase Strength and Stamina Increase Physical Activity;Increase Strength and Stamina Increase Physical Activity;Increase Strength and Stamina Increase Physical Activity;Increase Strength and Stamina;Understanding of Exercise Prescription   Comments Reviewed home exercise with pt today.  Pt plans to use her recumbant bike and [employer] staff videos for exercise. We reviewed the [facility] and patient is highly interested in joining with her husband. Reviewed THR, pulse, RPE, sign and symptoms, pulse oximetery and when to call 911 or MD.  Also discussed weather considerations and indoor options.  Pt voiced understanding. Natalye attends consistently but is not reaching THR range.  Staff will encourage increasing levels on seated  machines. Dorothy Rogers has been out with Covid and has not been able to attend her rehab sessions the last couple of weeks. She should be due to attend again tomorrow, 8/18 Glayds has just started back after being out with Covid.  Staff will monitor progress. Dorothy Rogers just moved and has a recumbant bike she plans to use at home for exercise but hasn't unpacked it yet. In the interim, she tries to walk her around her house and stay active outside. She is still recovering from covid which made her feel fatigued. We talked about exercise vs. being physically active.  She is limited byback and hip pain. She does check her HR at home when she is active and I encouraged her to keep that up.   Expected Outcomes Short: Add on 1 day of exercise at home: Continue to exercise at home independently at appropriate prescription Short: increase levels on seated machines Long:  reach THR range during exercise Short: Attend rehab consistently Long: Continue to increase overall MET level Short:  get back to consistent attendance  Long:  build overall stamina Short: Start using recumbant bike Long: Exercise independently at home at appropriate prescription    Row Name 02/09/21 1532 02/18/21 1341           Exercise Goal Re-Evaluation   Exercise Goals Review Increase Physical Activity;Increase Strength and Stamina;Understanding of Exercise Prescription Increase Physical Activity;Increase Strength and Stamina;Understanding of Exercise Prescription      Comments Dorothy Rogers is doing well in rehab.  She is doing 30 min on the XR at level 1.  She has refused to walk and will stay on the equipment while in rehab.  We will continue to encouraged walking and monitoring her heart rate. Dorothy Rogers completed her post 6MWT and increased by 90%! She feels better endurance-wise but doesn't feel too much of a difference with her breathing. Dorothy Rogers is getting ready to graduate in a couple weeks. She  has a recumbant bike at home she plans to use. She is  getting injections for her back and knee and hopes that may be able to allow her to walk more at home.      Expected Outcomes Short: Try high levels Long: Continue to improve stamina Short: Graduate Long: Continue to exercise independently at home at appropriate prescription               Discharge Exercise Prescription (Final Exercise Prescription Changes):  Exercise Prescription Changes - 02/23/21 1000       Response to Exercise   Blood Pressure (Admit) 130/62    Blood Pressure (Exit) 124/62    Heart Rate (Admit) 93 bpm    Heart Rate (Exercise) 109 bpm    Heart Rate (Exit) 96 bpm    Oxygen Saturation (Admit) 98 %    Oxygen Saturation (Exercise) 97 %    Oxygen Saturation (Exit) 99 %    Rating of Perceived Exertion (Exercise) 12    Perceived Dyspnea (Exercise) 3    Symptoms SOB    Duration Continue with 30 min of aerobic exercise without signs/symptoms of physical distress.    Intensity THRR unchanged      Progression   Progression Continue to progress workloads to maintain intensity without signs/symptoms of physical distress.    Average METs 2.75      Resistance Training   Training Prescription Yes    Weight 3 lb    Reps 10-15      Interval Training   Interval Training No      REL-XR   Level 1    Minutes 30    METs 2.75      Home Exercise Plan   Plans to continue exercise at Home (comment)   Recumbant bike, [employer] staff videos   Frequency Add 3 additional days to program exercise sessions.   Start with 1   Initial Home Exercises Provided 12/18/20             Nutrition:  Target Goals: Understanding of nutrition guidelines, daily intake of sodium <1552m, cholesterol <2035m calories 30% from fat and 7% or less from saturated fats, daily to have 5 or more servings of fruits and vegetables.  Education: All About Nutrition: -Group instruction provided by verbal, written material, interactive activities, discussions, models, and posters to present general  guidelines for heart healthy nutrition including fat, fiber, MyPlate, the role of sodium in heart healthy nutrition, utilization of the nutrition label, and utilization of this knowledge for meal planning. Follow up email sent as well. Written material given at graduation. [facility]  Education need identified 11/05/20  Date 12/10/20  Educator [place] Review Code 1- Verbalizes Understanding       Biometrics:  Pre Biometrics - 11/05/20 1513       Pre Biometrics   Height 5' 2.5" (1.588 m)    Weight 178 lb 8 oz (81 kg)    BMI (Calculated) 32.11    Single Leg Stand 14.4 seconds             Post Biometrics - 02/16/21 1520        Post  Biometrics   Height 5' 2.5" (1.588 m)    Weight 179 lb 1.6 oz (81.2 kg)    BMI (Calculated) 32.22             Nutrition Therapy Plan and Nutrition Goals:  Nutrition Therapy & Goals -  11/17/20 1432       Nutrition Therapy   Diet Heart healthy, low Na, diabetes friendly eating    Drug/Food Interactions Statins/Certain Fruits    Protein (specify units) 65g    Fiber 25 grams    Whole Grain Foods 3 servings    Saturated Fats 12 max. grams    Fruits and Vegetables 8 servings/day    Sodium 1.5 grams      Personal Nutrition Goals   Nutrition Goal ST: practice CHO counting at meals LT: follow MyPlate guidelines    Comments B: varies: this morning was egg and bacon and 2/3 toast (white) with some berries. With a cup of hot tea (equal). L sandwich or 1/2 sandwich (tomato, tuna, bolonga). With water. D: varies: tonight - hamburgers - will only have chips if BG is ok. Salmon cakes, chicken, pork chops. Chicken and dumplings, pizza 2x/month, quesadillas. S: cheese puffs or cheese or peanut butter. Drinks: water. CKD stg 3. She will tak eher Lantus in AM, she also has humulog and usually does not need it. Discussed heart healthy, diabetes friendly.      Intervention Plan    Intervention Prescribe, educate and counsel regarding individualized specific dietary modifications aiming towards targeted core components such as weight, hypertension, lipid management, diabetes, heart failure and other comorbidities.    Expected Outcomes Short Term Goal: Understand basic principles of dietary content, such as calories, fat, sodium, cholesterol and nutrients.;Short Term Goal: A plan has been developed with personal nutrition goals set during dietitian appointment.;Long Term Goal: Adherence to prescribed nutrition plan.             Nutrition Assessments:  MEDIFICTS Score Key: ?70 Need to make dietary changes  40-70 Heart Healthy Diet ? 40 Therapeutic Level Cholesterol Diet  Flowsheet Row Pulmonary Rehab from 02/23/2021 in [facility] Card[facility] and Pulmonary Rehab  Picture Your Plate Total Score on Admission 60  Picture Your Plate Total Score on Discharge 64      Picture Your Plate Scores: <76 Unhealthy dietary pattern with much room for improvement. 41-50 Dietary pattern unlikely to meet recommendations for good health and room for improvement. 51-60 More healthful dietary pattern, with some room for improvement.  >60 Healthy dietary pattern, although there may be some specific behaviors that could be improved.   Nutrition Goals Re-Evaluation:  Nutrition Goals Re-Evaluation     [place] Name 12/17/20 1400 01/26/21 1347 02/18/21 1352         Goals   Nutrition Goal ST: practice CHO counting at meals, 2-3 CHO per meal LT: follow MyPlate guidelines ST: practice CHO counting at meals, 2-3 CHO per meal LT: follow MyPlate guidelines ST: practice CHO counting at meals, 2-3 CHO per meal LT: follow MyPlate guidelines     Comment She reports she is practicing CHO counting, she can not recal how much she normally has at meals due to focusing more on quality of CHO. She tries to avoid snacking. Reviewed CHO counting and complex carbohydrates. She is eating a lot of produce from her  garden. Persephanie has cut down on sodium and sugar. Most of the time shes opting to get fresh fruit and vegatables.  She is mentally keeping track of CHO and looks at the labels at most things she eats. She lost weight from recently being sick nut has gained most of it back. She is limiting her snacks especially at night. Britiney is maintaing her goals from the RD well. She is continuing to practice carb counting and overall  does not have any complaints or concerns at this time. She is getting ready to graduate next week.     Expected Outcome ST: practice CHO counting at meals, 2-3 CHO per meal LT: follow MyPlate guidelines ST: practice CHO counting at meals, 2-3 CHO per meal LT: follow MyPlate guidelines Short: Graduate Long: Follow MyPlate Guidelines              Nutrition Goals Discharge (Final Nutrition Goals Re-Evaluation):  Nutrition Goals Re-Evaluation - 02/18/21 1352       Goals   Nutrition Goal ST: practice CHO counting at meals, 2-3 CHO per meal LT: follow MyPlate guidelines    Comment Dorothy Rogers is maintaing her goals from the RD well. She is continuing to practice carb counting and overall does not have any complaints or concerns at this time. She is getting ready to graduate next week.    Expected Outcome Short: Graduate Long: Follow MyPlate Guidelines             Psychosocial: Target Goals: Acknowledge presence or absence of significant depression and/or stress, maximize coping skills, provide positive support system. Participant is able to verbalize types and ability to use techniques and skills needed for reducing stress and depression.   Education: Stress, Anxiety, and Depression - Group verbal and visual presentation to define topics covered.  Reviews how body is impacted by stress, anxiety, and depression.  Also discusses healthy ways to reduce stress and to treat/manage anxiety and depression.  Written material given at graduation.   Education: Sleep Hygiene -Provides  group verbal and written instruction about how sleep can affect your health.  Define sleep hygiene, discuss sleep cycles and impact of sleep habits. Review good sleep hygiene tips.    Initial Review & Psychosocial Screening:  Initial Psych Review & Screening - 10/29/20 1550       Initial Review   Current issues with Current Stress Concerns;Current Sleep Concerns    Source of Stress Concerns Unable to perform yard/household activities    Comments Worries about everything. Dorothy Rogers? Yes   husband and son     Barriers   Psychosocial barriers to participate in program There are no identifiable barriers or psychosocial needs.;The patient should benefit from training in stress management and relaxation.      Screening Interventions   Interventions Encouraged to exercise;To provide support and resources with identified psychosocial needs;Provide feedback about the scores to participant    Expected Outcomes Short Term goal: Utilizing psychosocial counselor, staff and physician to assist with identification of specific Stressors or current issues interfering with healing process. Setting desired goal for each stressor or current issue identified.;Long Term Goal: Stressors or current issues are controlled or eliminated.;Short Term goal: Identification and review with participant of any Quality of Life or Depression concerns found by scoring the questionnaire.;Long Term goal: The participant improves quality of Life and PHQ9 Scores as seen by post scores and/or verbalization of changes             Quality of Life Scores:  Scores of 19 and below usually indicate a poorer quality of life in these areas.  A difference of  2-3 points is a clinically meaningful difference.  A difference of 2-3 points in the total score of the Quality of Life Index has been associated with significant improvement in overall quality of life, self-image, physical  symptoms, and general health in studies assessing change in quality  of life.  PHQ-9: Recent Review Flowsheet Data     Depression screen [facility] 2/9 02/23/2021 11/27/2020 11/05/2020   Decreased Interest _0 Down, Depressed, Hopeless 1 0 1   PHQ - 2 Score _1 Altered sleeping _2 Tired, decreased energy _3 Change in appetite 0 0 0   Feeling bad or failure about yourself  1 0 1   Trouble concentrating 0 0 0   Moving slowly or fidgety/restless 0 0 0   Suicidal thoughts 0 0 0   PHQ-9 Score _4 Difficult doing work/chores Somewhat difficult Somewhat difficult Somewhat difficult      Interpretation of Total Score  Total Score Depression Severity:  1-4 = Minimal depression, 5-9 = Mild depression, 10-14 = Moderate depression, 15-19 = Moderately severe depression, 20-27 = Severe depression   Psychosocial Evaluation and Intervention:  Psychosocial Evaluation - 10/29/20 1613       Psychosocial Evaluation & Interventions   Interventions Encouraged to exercise with the program and follow exercise prescription;Relaxation education    Comments Dorothy Rogers has no barriers to attending the program. She has a great support system with her husband at home. She does have some concerns with her sleep pattern and has tried melatonin that did not help. Her stress is over the fact that she cannot participate in actitivities because of fatigue or shortness of breath stopping her. She worries and cannot always make decisions per her husband and Dorothy Rogers. She is looking forward to attending the program to see if she can gain some stamina and less shortness of breath with activity. She does have back and hip chronic concerns. She is being treated with injections and meds to help control the pain . She is ready to start the program.    Expected Outcomes STG Dorothy Rogers is able to attend all scheduled sessions and gain some stamina to be able to do more at home with less fatigue and shortness of breath. Hoping  this will reduce the stress of not participating in her daily activities.  LTG: Dorothy Rogers will be able to maintain her progress from the program    Continue Psychosocial Services  Follow up required by staff             Psychosocial Re-Evaluation:  Psychosocial Re-Evaluation     [place] Name 11/27/20 1343 12/17/20 1403 01/26/21 1350 02/18/21 1350       Psychosocial Re-Evaluation   Current issues with Current Anxiety/Panic;Current Psychotropic Meds Current Anxiety/Panic;Current Psychotropic Meds Current Anxiety/Panic;Current Psychotropic Meds Current Anxiety/Panic;Current Psychotropic Meds    Comments Reviewed patient health questionnaire (PHQ-9) with patient for follow up. Previously, patients score indicated signs/symptoms of depression.  Reviewed to see if patient is improving symptom wise while in program.  Score improved/declined and patient states that it is because of her support system. Her husband son and family is telling her that her health is going to get better and makes her feel more positve. She gets stressed due to her breathing and not being able to do the things she needs or wants to do. She has a prescription for ativan, but has not used it yet. To calm herself down she will sit and put her head down - she also talks to her husband (he is a good support system for her). She feels like she is putting too much pressure on herself, encouraged her that she is doing a good job at rehab.  She reports sleeping well most night, but sometimes she will be up until 4am - sometimes she will have racing thoughts - she reports that is normal for her. Dorothy Rogers is doing well. She sometimes get down but has really good support from her husband that picks her back up.  Dorothy Rogers takes ativan as needed and just used one only in the last couple of months. She is recovering from Gail which brought her energy down. She states her mind races at night and we talked about trying out guided meditation or deep breathing  right before bed. She has no interest in any other itnervention for mental health at this time. Zilpha is still holding up positively. Her mind still races at night but is not interested in taking any oral medications to help with sleep. She takes ativan prn but not often. Dorothy Rogers is graduating soon and is proud of herself for being able to talk down here. She is graduating next week and pleased with the staff here and well she was able to tolerate the exercise machines.    Expected Outcomes Short: Continue to attend [facility] regularly for regular exercise and social engagement. Long: Continue to improve symptoms and manage a positive mental state. Short: Continue to attend [facility] regularly for regular exercise and social engagement. Long: Continue to improve symptoms and manage a positive mental state. Short: Continue attendance with rehab Long: utilize exercise for stress management and maintain positive attitude Short: Graduate  Long: Maintain positive attitude    Interventions Encouraged to attend Pulmonary Rehabilitation for the exercise Encouraged to attend Pulmonary Rehabilitation for the exercise Encouraged to attend Pulmonary Rehabilitation for the exercise Encouraged to attend Pulmonary Rehabilitation for the exercise    Continue Psychosocial Services  Follow up required by staff Follow up required by staff Follow up required by staff Follow up required by staff             Psychosocial Discharge (Final Psychosocial Re-Evaluation):  Psychosocial Re-Evaluation - 02/18/21 1350       Psychosocial Re-Evaluation   Current issues with Current Anxiety/Panic;Current Psychotropic Meds    Comments Dorothy Rogers is still holding up positively. Her mind still races at night but is not interested in taking any oral medications to help with sleep. She takes ativan prn but not often. Dorothy Rogers is graduating soon and is proud of herself for being able to talk down here. She is graduating next week and pleased  with the staff here and well she was able to tolerate the exercise machines.    Expected Outcomes Short: Graduate  Long: Maintain positive attitude    Interventions Encouraged to attend Pulmonary Rehabilitation for the exercise    Continue Psychosocial Services  Follow up required by staff             Education: Education Goals: Education classes will be provided on a weekly basis, covering required topics. Participant will state understanding/return demonstration of topics presented.  Learning Barriers/Preferences:   General Pulmonary Education Topics:  Infection Prevention: - Provides verbal and written material to individual with discussion of infection control including proper hand washing and proper equipment cleaning during exercise session. Flowsheet Row Pulmonary Rehab from 01/21/2021 in [facility] Cardiac and Pulmonary Rehab  Date 11/05/20  Educator [facility]  Instruction Review Code 1- Verbalizes Understanding       Falls Prevention: - Provides verbal and written material to individual with discussion of falls prevention and safety. Flowsheet Row Pulmonary Rehab from 01/21/2021 in [facility] and Pulmonary Rehab  Date 10/29/20  Educator SB  Instruction Review Code 1- Verbalizes Understanding       Chronic Lung Disease Review: - Group verbal instruction with posters, models, PowerPoint presentations and videos,  to review Dorothy updates, Dorothy respiratory medications, Dorothy advancements in procedures and treatments. Providing information on websites and "800" numbers for continued self-education. Includes information about supplement oxygen, available portable oxygen systems, continuous and intermittent flow rates, oxygen safety, concentrators, and Medicare reimbursement for oxygen. Explanation of Pulmonary Drugs, including class, frequency, complications, importance of spacers, rinsing mouth after steroid MDI's, and proper cleaning methods for nebulizers. Review of basic lung anatomy and  physiology related to function, structure, and complications of lung disease. Review of risk factors. Discussion about methods for diagnosing sleep apnea and types of masks and machines for OSA. Includes a review of the use of types of environmental controls: home humidity, furnaces, filters, dust mite/pet prevention, HEPA vacuums. Discussion about weather changes, air quality and the benefits of nasal washing. Instruction on Warning signs, infection symptoms, calling MD promptly, preventive modes, and value of vaccinations. Review of effective airway clearance, coughing and/or vibration techniques. Emphasizing that all should Create an Action Plan. Written material given at graduation. Flowsheet Row Pulmonary Rehab from 01/21/2021 in [facility] Pulmonary Rehab  Education need identified 11/05/20  Date 12/31/20  Educator [facility]  Instruction Review Code 1- Verbalizes Understanding       AED/CPR: - Group verbal and written instruction with the use of models to demonstrate the basic use of the AED with the basic ABC's of resuscitation.    Anatomy and Cardiac Procedures: - Group verbal and visual presentation and models provide information about basic cardiac anatomy and function. Reviews the testing methods done to diagnose heart disease and the outcomes of the test results. Describes the treatment choices: Medical Management, Angioplasty, or Coronary Bypass Surgery for treating various heart conditions including Myocardial Infarction, Angina, Valve Disease, and Cardiac Arrhythmias.  Written material given at graduation. Flowsheet Row Pulmonary Rehab from 01/21/2021 in [facility] Cardiac and Pulmonary Rehab  Date 11/26/20  Educator [facility]  Instruction Review Code 1- Verbalizes Understanding       Medication Safety: - Group verbal and visual instruction to review commonly prescribed medications for heart and lung disease. Reviews the medication, class of the drug, and side effects. Includes the steps to  properly store meds and maintain the prescription regimen.  Written material given at graduation. Flowsheet Row Pulmonary Rehab from 01/21/2021 in [facility] Cardiac and Pulmonary Rehab  Date 12/17/20  Educator [facility]  Instruction Review Code 1- Verbalizes Understanding       Other: -Provides group and verbal instruction on various topics (see comments)   Knowledge Questionnaire Score:  Knowledge Questionnaire Score - 02/23/21 1344       Knowledge Questionnaire Score   Pre Score 15/18  oxygen and nutrition    Post Score 16/18              Core Components/Risk Factors/Patient Goals at Admission:  Personal Goals and Risk Factors at Admission - 11/05/20 1514       Core Components/Risk Factors/Patient Goals on Admission    Weight Management Yes;Obesity;Weight Loss    Intervention Weight Management: Develop a combined nutrition and exercise program designed to reach desired caloric intake, while maintaining appropriate intake of nutrient and fiber, sodium and fats, and appropriate energy expenditure required for the weight goal.;Weight Management/Obesity: Establish reasonable short term and long term weight goals.;Weight Management: Provide education and appropriate resources to help  participant work on and attain dietary goals.;Obesity: Provide education and appropriate resources to help participant work on and attain dietary goals.    Admit Weight 178 lb 8 oz (81 kg)    Goal Weight: Short Term 170 lb (77.1 kg)    Goal Weight: Long Term 165 lb (74.8 kg)    Expected Outcomes Short Term: Continue to assess and modify interventions until short term weight is achieved;Long Term: Adherence to nutrition and physical activity/exercise program aimed toward attainment of established weight goal;Weight Loss: Understanding of general recommendations for a balanced deficit meal plan, which promotes 1-2 lb weight loss per week and includes a negative energy balance of [phone] kcal/d;Understanding  recommendations for meals to include 15-35% energy as protein, 25-35% energy from fat, 35-60% energy from carbohydrates, less than 2110m of dietary cholesterol, 20-35 gm of total fiber daily;Understanding of distribution of calorie intake throughout the day with the consumption of 4-5 meals/snacks    Improve shortness of breath with ADL's Yes    Intervention Provide education, individualized exercise plan and daily activity instruction to help decrease symptoms of SOB with activities of daily living.    Expected Outcomes Short Term: Improve cardiorespiratory fitness to achieve a reduction of symptoms when performing ADLs;Long Term: Be able to perform more ADLs without symptoms or delay the onset of symptoms    Diabetes Yes    Intervention Provide education about signs/symptoms and action to take for hypo/hyperglycemia.;Provide education about proper nutrition, including hydration, and aerobic/resistive exercise prescription along with prescribed medications to achieve blood glucose in normal ranges: Fasting glucose 65-99 mg/dL    Expected Outcomes Short Term: Participant verbalizes understanding of the signs/symptoms and immediate care of hyper/hypoglycemia, proper foot care and importance of medication, aerobic/resistive exercise and nutrition plan for blood glucose control.;Long Term: Attainment of HbA1C < 7%.    Hypertension Yes    Intervention Provide education on lifestyle modifcations including regular physical activity/exercise, weight management, moderate sodium restriction and increased consumption of fresh fruit, vegetables, and low fat dairy, alcohol moderation, and smoking cessation.;Monitor prescription use compliance.    Expected Outcomes Short Term: Continued assessment and intervention until BP is < 140/937mHG in hypertensive participants. < 130/8073mG in hypertensive participants with diabetes, heart failure or chronic kidney disease.;Long Term: Maintenance of blood pressure at goal  levels.    Lipids Yes    Intervention Provide education and support for participant on nutrition & aerobic/resistive exercise along with prescribed medications to achieve LDL <61m83mDL >40mg74m Expected Outcomes Short Term: Participant states understanding of desired cholesterol values and is compliant with medications prescribed. Participant is following exercise prescription and nutrition guidelines.;Long Term: Cholesterol controlled with medications as prescribed, with individualized exercise RX and with personalized nutrition plan. Value goals: LDL < 61mg,43m > 40 mg.             Education:Diabetes - Individual verbal and written instruction to review signs/symptoms of diabetes, desired ranges of glucose level fasting, after meals and with exercise. Acknowledge that pre and post exercise glucose checks will be done for 3 sessions at entry of program. Flowsheet Row [facility]  Date 11/05/20  [facility]       Know Your Numbers and Heart Failure: - Group verbal and visual instruction to discuss disease risk factors for cardiac and pulmonary disease and treatment options.  Reviews associated critical values for Overweight/Obesity, Hypertension, Cholesterol, and Diabetes.  Discusses basics of heart failure: signs/symptoms and treatments.  Introduces Heart Failure Zone chart for action plan for heart failure.  Written material given at graduation. Flowsheet Row Pulmonary Rehab from 01/21/2021 in [facility] Cardiac and Pulmonary Rehab  Date 12/24/20  Educator [facility]  Instruction Review Code 1- Verbalizes Understanding       Core Components/Risk Factors/Patient Goals Review:   Goals and Risk Factor Review     Row Name 11/27/20 1339 12/17/20 1357 01/26/21 1354 02/18/21 1346       Core Components/Risk Factors/Patient Goals Review   Personal Goals Review Improve shortness of breath  with ADL's Improve shortness of breath with ADL's Improve shortness of breath with ADL's;Diabetes;Hypertension;Weight Management/Obesity Improve shortness of breath with ADL's;Diabetes;Hypertension    Review Spoke to patient about their shortness of breath and what they can do to improve. Patient has been informed of breathing techniques when starting the program. Patient is informed to tell staff if they have had any med changes and that certain meds they are taking or not taking can be causing shortness of breath. Practice pursed lip breathing- she keeps forgetting to use it and it feels unnatural. She does not wear a pulse ox at home to check o2 but is interested in getting one. She feels her shortness of breath has been about the same. She is taking all her medications and supplements as directed - she is having no problems. Dorothy Rogers is holding up. She is still practicing PLB when she feels SOB. She has good and bad days with her breathing. Her sugars are pretty stable for the most part, she was recently on prednisone which is making he sugars go a little high, however her doctor is aware. She checks her BP several times/ week and runs stable around 120s/ 60-70s. She is still taking all medications as prescribed. Her weight has been consistent; she did lose weight when she was sick recently and has now gained it back once her appetite came. Dorothy Rogers still struggles with her SOB but she is aware to use PLB and monitor herself. She is pleased with her increased stamina as she walks down to rehab when she couldn't before. Blood pressures at home have still been stable and runs anywhere to 097-353G systolic. She is staying compliant with all her medications. She is still checking her sugars and recently got an A1C was 7.7, about the same as before. Sugars run on average 90-110 fasting. She is getting ready to graduate next week.    Expected Outcomes Short: Attend [facility] regularly to improve shortness of breath with  ADL's. Long: maintain independence with ADL's Short: Attend [facility] regularly to improve shortness of breath with ADL's. Long: maintain independence with ADL's Short: Keep checking sugars and maintain normalcy Long: Continue to manage life style risk factors Short: Work on decreasing A1C and graduate Long: Continue to monitor life style risk factors             Core Components/Risk Factors/Patient Goals at Discharge (Final Review):   Goals and Risk Factor Review - 02/18/21 1346       Core Components/Risk Factors/Patient Goals Review   Personal Goals Review Improve shortness of breath with ADL's;Diabetes;Hypertension    Review Dorothy Rogers still struggles with her SOB but she is aware to use PLB and monitor herself. She is pleased with her increased stamina as she walks down to rehab when she couldn't before. Blood pressures at home have still been stable and runs anywhere to 992-426S systolic. She is staying compliant  with all her medications. She is still checking her sugars and recently got an A1C was 7.7, about the same as before. Sugars run on average 90-110 fasting. She is getting ready to graduate next week.    Expected Outcomes Short: Work on decreasing A1C and graduate Long: Continue to monitor life style risk factors             ITP Comments:  ITP Comments     Row Name 10/29/20 1620 11/05/20 1507 11/10/20 1429 11/12/20 0743 12/10/20 0733   ITP Comments Virtual orientation call completed today. shehas an appointment on Date: 11/05/2020 for EP eval and gym Orientation.  Documentation of diagnosis can be found in [facility] Date: 10/01/2020. Completed 6MWT and gym orientation. Initial ITP created and sent for review to Dr. Zetta Bills, Medical Director. First full day of exercise!  Patient was oriented to gym and equipment including functions, settings, policies, and procedures.  Patient's individual exercise prescription and treatment plan were reviewed.  All starting workloads were  established based on the results of the 6 minute walk test done at initial orientation visit.  The plan for exercise progression was also introduced and progression will be customized based on patient's performance and goals. 30 Day review completed. Medical Director ITP review done, changes made as directed, and signed approval by Medical Director.  Dorothy to program 30 Day review completed. Medical Director ITP review done, changes made as directed, and signed approval by Medical Director.    [place] Name 01/05/21 1552 01/07/21 0722 02/04/21 0625       ITP Comments Dorothy Rogers's husband just called and they both have tested positive for COVID today.  She will be able to return on 01/15/21 if feeling better. 30 Day review completed. Medical Director ITP review done, changes made as directed, and signed approval by Medical Director. 30 Day review completed. Medical Director ITP review done, changes made as directed, and signed approval by Medical Director.              Comments: Discharge ITP

## 2021-02-26 NOTE — Patient Instructions (Signed)
Discharge Patient Instructions  Patient Details  Name: Dorothy Rogers MRN: [id number] Date of Birth: Feb 13, 1951 Referring Provider:  Ottie Glazier, MD   Number of Visits: 37  Reason for Discharge:  Patient reached a stable level of exercise. Patient independent in their exercise. Patient has met program and personal goals.  Smoking History:  Social History   Tobacco Use  Smoking Status Never  Smokeless Tobacco Never    Diagnosis:  Pulmonary hypertension ([place])  Initial Exercise Prescription:  Initial Exercise Prescription - 11/05/20 1500       Date of Initial Exercise RX and Referring Provider   Date 11/05/20    Referring Provider Zetta Bills MD      Recumbant Bike   Level 1    RPM 50    Watts 5    Minutes 15    METs 2      NuStep   Level 1    SPM 80    Minutes 15    METs 2      REL-XR   Level 1    Speed 50    Minutes 15    METs 2      Track   Laps 8    Minutes 15    METs 1.4      Prescription Details   Frequency (times per week) 3    Duration Progress to 30 minutes of continuous aerobic without signs/symptoms of physical distress      Intensity   THRR 40-80% of Max Heartrate 117-139    Ratings of Perceived Exertion 11-13    Perceived Dyspnea 0-4      Progression   Progression Continue to progress workloads to maintain intensity without signs/symptoms of physical distress.      Resistance Training   Training Prescription Yes    Weight 3 lb    Reps 10-15             Discharge Exercise Prescription (Final Exercise Prescription Changes):  Exercise Prescription Changes - 02/23/21 1000       Response to Exercise   Blood Pressure (Admit) 130/62    Blood Pressure (Exit) 124/62    Heart Rate (Admit) 93 bpm    Heart Rate (Exercise) 109 bpm    Heart Rate (Exit) 96 bpm    Oxygen Saturation (Admit) 98 %    Oxygen Saturation (Exercise) 97 %    Oxygen Saturation (Exit) 99 %    Rating of Perceived Exertion (Exercise) 12     Perceived Dyspnea (Exercise) 3    Symptoms SOB    Duration Continue with 30 min of aerobic exercise without signs/symptoms of physical distress.    Intensity THRR unchanged      Progression   Progression Continue to progress workloads to maintain intensity without signs/symptoms of physical distress.    Average METs 2.75      Resistance Training   Training Prescription Yes    Weight 3 lb    Reps 10-15      Interval Training   Interval Training No      REL-XR   Level 1    Minutes 30    METs 2.75      Home Exercise Plan   Plans to continue exercise at Home (comment)   Recumbant bike, Youtube staff videos   Frequency Add 3 additional days to program exercise sessions.   Start with 1   Initial Home Exercises Provided 12/18/20  Functional Capacity:  6 Minute Walk     Row Name 11/05/20 1508 02/16/21 1517       6 Minute Walk   Phase Initial Discharge    Distance 296 feet 565 feet    Distance % Change -- 90.8 %    Distance Feet Change -- 269 ft    Walk Time 4.88 minutes 5.8 minutes    # of Rest Breaks 5  10 sec, 17 sec, 12 sec, 10 sec, stopped at 5:32 1  3:50-4:22    MPH 0.67 1.07    METS 1.24 1.74    RPE 15 13    Perceived Dyspnea  3 3    VO2 Peak 4.35 6.12    Symptoms Yes (comment) Yes (comment)    Comments SOB, chronic hip pain 3/10 Hip pain 6/10, SOB    Resting HR 95 bpm 100 bpm    Resting BP 132/64 142/70    Resting Oxygen Saturation  98 % 100 %    Exercise Oxygen Saturation  during 6 min walk 98 % 98 %    Max Ex. HR 128 bpm 132 bpm    Max Ex. BP 146/72 146/64    2 Minute Post BP 130/64 118/62         Interval HR   1 Minute HR 127 125    2 Minute HR 128 132    3 Minute HR 127 128    4 Minute HR 123 124    5 Minute HR 124 129    6 Minute HR 113 129    2 Minute Post HR 106 113    Interval Heart Rate? Yes Yes         Interval Oxygen   Interval Oxygen? Yes Yes    Baseline Oxygen Saturation % 98 % 100 %    1 Minute Oxygen Saturation % 98  % 99 %    1 Minute Liters of Oxygen 0 L  Room Air 0 L  RA    2 Minute Oxygen Saturation % 99 % 98 %    2 Minute Liters of Oxygen 0 L 0 L    3 Minute Oxygen Saturation % 99 % 99 %    3 Minute Liters of Oxygen 0 L 0 L    4 Minute Oxygen Saturation % 98 % 8 %    4 Minute Liters of Oxygen 0 L 0 L    5 Minute Oxygen Saturation % 98 % 99 %    5 Minute Liters of Oxygen 0 L 0 L    6 Minute Oxygen Saturation % 98 % 99 %    6 Minute Liters of Oxygen 0 L 0 L    2 Minute Post Oxygen Saturation % 98 % --    2 Minute Post Liters of Oxygen 0 L --                Nutrition & Weight - Outcomes:  Pre Biometrics - 11/05/20 1513       Pre Biometrics   Height 5' 2.5" (1.588 m)    Weight 178 lb 8 oz (81 kg)    BMI (Calculated) 32.11    Single Leg Stand 14.4 seconds             Post Biometrics - 02/16/21 1520        Post  Biometrics   Height 5' 2.5" (1.588 m)    Weight 179 lb 1.6 oz (81.2 kg)    BMI (Calculated)  32.22             Nutrition:  Nutrition Therapy & Goals - 11/17/20 1432       Nutrition Therapy   Diet Heart healthy, low Na, diabetes friendly eating    Drug/Food Interactions Statins/Certain Fruits    Protein (specify units) 65g    Fiber 25 grams    Whole Grain Foods 3 servings    Saturated Fats 12 max. grams    Fruits and Vegetables 8 servings/day    Sodium 1.5 grams      Personal Nutrition Goals   Nutrition Goal ST: practice CHO counting at meals LT: follow MyPlate guidelines    Comments B: varies: this morning was egg and bacon and 2/3 toast (white) with some berries. With a cup of hot tea (equal). L sandwich or 1/2 sandwich (tomato, tuna, bolonga). With water. D: varies: tonight - hamburgers - will only have chips if BG is ok. Salmon cakes, chicken, pork chops. Chicken and dumplings, pizza 2x/month, quesadillas. S: cheese puffs or cheese or peanut butter. Drinks: water. CKD stg 3. She will tak eher Lantus in AM, she also has humulog and usually does not need  it. Discussed heart healthy, diabetes friendly.      Intervention Plan   Intervention Prescribe, educate and counsel regarding individualized specific dietary modifications aiming towards targeted core components such as weight, hypertension, lipid management, diabetes, heart failure and other comorbidities.    Expected Outcomes Short Term Goal: Understand basic principles of dietary content, such as calories, fat, sodium, cholesterol and nutrients.;Short Term Goal: A plan has been developed with personal nutrition goals set during dietitian appointment.;Long Term Goal: Adherence to prescribed nutrition plan.            Goals reviewed with patient; copy given to patient.

## 2021-02-26 NOTE — Progress Notes (Signed)
Discharge Progress Report  Patient Details  Name: Dorothy Rogers MRN: [id number] Date of Birth: 1951-04-15 Referring Provider:   Flowsheet Row Pulmonary Rehab from 11/05/2020 in [facility]  Referring Provider Zetta Bills MD        Number of Visits: 17  Reason for Discharge:  Patient reached a stable level of exercise. Patient independent in their exercise. Patient has met program and personal goals.  Smoking History:  Social History   Tobacco Use  Smoking Status Never  Smokeless Tobacco Never    Diagnosis:  Pulmonary hypertension ([place])  ADL UCSD:  Pulmonary Assessment Scores     Row Name 10/29/20 1609 11/05/20 1517 02/16/21 1520     ADL UCSD   ADL Phase Entry Entry Exit   SOB Score total 88 88 --   Rest 2 2 --   Walk 4 4 --   Stairs 4 4 --   Bath 5 5 --   Dress 4 4 --   Shop 4 4 --     CAT Score   CAT Score 15 15 --     mMRC Score   mMRC Score -- 4 2    Row Name 02/23/21 1347         ADL UCSD   ADL Phase Exit     SOB Score total 79     Rest 1     Walk 4     Stairs 5     Bath 5     Dress 4     Shop 4           CAT Score   CAT Score 17           mMRC Score   mMRC Score 2              Initial Exercise Prescription:  Initial Exercise Prescription - 11/05/20 1500       Date of Initial Exercise RX and Referring Provider   Date 11/05/20    Referring Provider Zetta Bills MD      Recumbant Bike   Level 1    RPM 50    Watts 5    Minutes 15    METs 2      NuStep   Level 1    SPM 80    Minutes 15    METs 2      REL-XR   Level 1    Speed 50    Minutes 15    METs 2      Track   Laps 8    Minutes 15    METs 1.4      Prescription Details   Frequency (times per week) 3    Duration Progress to 30 minutes of continuous aerobic without signs/symptoms of physical distress      Intensity   THRR 40-80% of Max Heartrate 117-139    Ratings of Perceived Exertion 11-13    Perceived Dyspnea 0-4       Progression   Progression Continue to progress workloads to maintain intensity without signs/symptoms of physical distress.      Resistance Training   Training Prescription Yes    Weight 3 lb    Reps 10-15             Discharge Exercise Prescription (Final Exercise Prescription Changes):  Exercise Prescription Changes - 02/23/21 1000       Response to Exercise   Blood Pressure (Admit) 130/62    Blood  Pressure (Exit) 124/62    Heart Rate (Admit) 93 bpm    Heart Rate (Exercise) 109 bpm    Heart Rate (Exit) 96 bpm    Oxygen Saturation (Admit) 98 %    Oxygen Saturation (Exercise) 97 %    Oxygen Saturation (Exit) 99 %    Rating of Perceived Exertion (Exercise) 12    Perceived Dyspnea (Exercise) 3    Symptoms SOB    Duration Continue with 30 min of aerobic exercise without signs/symptoms of physical distress.    Intensity THRR unchanged      Progression   Progression Continue to progress workloads to maintain intensity without signs/symptoms of physical distress.    Average METs 2.75      Resistance Training   Training Prescription Yes    Weight 3 lb    Reps 10-15      Interval Training   Interval Training No      REL-XR   Level 1    Minutes 30    METs 2.75      Home Exercise Plan   Plans to continue exercise at Home (comment)   Recumbant bike, Youtube staff videos   Frequency Add 3 additional days to program exercise sessions.   Start with 1   Initial Home Exercises Provided 12/18/20             Functional Capacity:  6 Minute Walk     Row Name 11/05/20 1508 02/16/21 1517       6 Minute Walk   Phase Initial Discharge    Distance 296 feet 565 feet    Distance % Change -- 90.8 %    Distance Feet Change -- 269 ft    Walk Time 4.88 minutes 5.8 minutes    # of Rest Breaks 5  10 sec, 17 sec, 12 sec, 10 sec, stopped at 5:32 1  3:50-4:22    MPH 0.67 1.07    METS 1.24 1.74    RPE 15 13    Perceived Dyspnea  3 3    VO2 Peak 4.35 6.12    Symptoms Yes  (comment) Yes (comment)    Comments SOB, chronic hip pain 3/10 Hip pain 6/10, SOB    Resting HR 95 bpm 100 bpm    Resting BP 132/64 142/70    Resting Oxygen Saturation  98 % 100 %    Exercise Oxygen Saturation  during 6 min walk 98 % 98 %    Max Ex. HR 128 bpm 132 bpm    Max Ex. BP 146/72 146/64    2 Minute Post BP 130/64 118/62         Interval HR   1 Minute HR 127 125    2 Minute HR 128 132    3 Minute HR 127 128    4 Minute HR 123 124    5 Minute HR 124 129    6 Minute HR 113 129    2 Minute Post HR 106 113    Interval Heart Rate? Yes Yes         Interval Oxygen   Interval Oxygen? Yes Yes    Baseline Oxygen Saturation % 98 % 100 %    1 Minute Oxygen Saturation % 98 % 99 %    1 Minute Liters of Oxygen 0 L  Room Air 0 L  RA    2 Minute Oxygen Saturation % 99 % 98 %    2 Minute Liters of Oxygen 0 L 0  L    3 Minute Oxygen Saturation % 99 % 99 %    3 Minute Liters of Oxygen 0 L 0 L    4 Minute Oxygen Saturation % 98 % 8 %    4 Minute Liters of Oxygen 0 L 0 L    5 Minute Oxygen Saturation % 98 % 99 %    5 Minute Liters of Oxygen 0 L 0 L    6 Minute Oxygen Saturation % 98 % 99 %    6 Minute Liters of Oxygen 0 L 0 L    2 Minute Post Oxygen Saturation % 98 % --    2 Minute Post Liters of Oxygen 0 L --             Psychological, QOL, Others - Outcomes: PHQ 2/9: Depression screen [facility] 2/9 02/23/2021 11/27/2020 11/05/2020  Decreased Interest _0 Down, Depressed, Hopeless 1 0 1  PHQ - 2 Score _1 Altered sleeping _2 Tired, decreased energy _3 Change in appetite 0 0 0  Feeling bad or failure about yourself  1 0 1  Trouble concentrating 0 0 0  Moving slowly or fidgety/restless 0 0 0  Suicidal thoughts 0 0 0  PHQ-9 Score _4 Difficult doing work/chores Somewhat difficult Somewhat difficult Somewhat difficult  Some recent data might be hidden     Nutrition & Weight - Outcomes:  Pre Biometrics - 11/05/20 1513       Pre Biometrics   Height 5' 2.5"  (1.588 m)    Weight 178 lb 8 oz (81 kg)    BMI (Calculated) 32.11    Single Leg Stand 14.4 seconds             Post Biometrics - 02/16/21 1520        Post  Biometrics   Height 5' 2.5" (1.588 m)    Weight 179 lb 1.6 oz (81.2 kg)    BMI (Calculated) 32.22             Nutrition:  Nutrition Therapy & Goals - 11/17/20 1432       Nutrition Therapy   Diet Heart healthy, low Na, diabetes friendly eating    Drug/Food Interactions Statins/Certain Fruits    Protein (specify units) 65g    Fiber 25 grams    Whole Grain Foods 3 servings    Saturated Fats 12 max. grams    Fruits and Vegetables 8 servings/day    Sodium 1.5 grams      Personal Nutrition Goals   Nutrition Goal ST: practice CHO counting at meals LT: follow MyPlate guidelines    Comments B: varies: this morning was egg and bacon and 2/3 toast (white) with some berries. With a cup of hot tea (equal). L sandwich or 1/2 sandwich (tomato, tuna, bolonga). With water. D: varies: tonight - hamburgers - will only have chips if BG is ok. Salmon cakes, chicken, pork chops. Chicken and dumplings, pizza 2x/month, quesadillas. S: cheese puffs or cheese or peanut butter. Drinks: water. CKD stg 3. She will tak eher Lantus in AM, she also has humulog and usually does not need it. Discussed heart healthy, diabetes friendly.      Intervention Plan   Intervention Prescribe, educate and counsel regarding individualized specific dietary modifications aiming towards targeted core components such as weight, hypertension, lipid management, diabetes, heart failure and other comorbidities.    Expected Outcomes Short Term Goal: Understand basic  principles of dietary content, such as calories, fat, sodium, cholesterol and nutrients.;Short Term Goal: A plan has been developed with personal nutrition goals set during dietitian appointment.;Long Term Goal: Adherence to prescribed nutrition plan.              Education Questionnaire Score:   Knowledge Questionnaire Score - 02/23/21 1344       Knowledge Questionnaire Score   Pre Score 15/18  oxygen and nutrition    Post Score 16/18             Goals reviewed with patient; copy given to patient.

## 2021-03-04 ENCOUNTER — Other Ambulatory Visit: Payer: Self-pay

## 2021-03-04 ENCOUNTER — Ambulatory Visit
Admission: RE | Admit: 2021-03-04 | Discharge: 2021-03-04 | Disposition: A | Discharge status: Home / Self Care | Payer: Medicare Other | Source: Ambulatory Visit | Attending: Internal Medicine | Admitting: Internal Medicine

## 2021-03-04 DIAGNOSIS — Z1231 Encounter for screening mammogram for malignant neoplasm of breast: Secondary | ICD-10-CM | POA: Diagnosis not present

## 2021-03-17 ENCOUNTER — Encounter: Payer: Self-pay | Admitting: General Surgery

## 2021-04-03 ENCOUNTER — Other Ambulatory Visit (HOSPITAL_COMMUNITY): Payer: Self-pay | Admitting: Pulmonary Disease

## 2021-04-03 ENCOUNTER — Other Ambulatory Visit: Payer: Self-pay | Admitting: Pulmonary Disease

## 2021-04-03 DIAGNOSIS — J986 Disorders of diaphragm: Secondary | ICD-10-CM

## 2021-04-08 ENCOUNTER — Other Ambulatory Visit: Payer: Self-pay

## 2021-04-08 ENCOUNTER — Ambulatory Visit
Admission: RE | Admit: 2021-04-08 | Discharge: 2021-04-08 | Disposition: A | Discharge status: Home / Self Care | Payer: Medicare Other | Source: Ambulatory Visit | Attending: Pulmonary Disease | Admitting: Pulmonary Disease

## 2021-04-08 DIAGNOSIS — J986 Disorders of diaphragm: Secondary | ICD-10-CM | POA: Insufficient documentation

## 2021-10-19 ENCOUNTER — Other Ambulatory Visit: Payer: Self-pay | Admitting: Nurse Practitioner

## 2021-10-19 DIAGNOSIS — M5414 Radiculopathy, thoracic region: Secondary | ICD-10-CM

## 2021-10-28 ENCOUNTER — Ambulatory Visit
Admission: RE | Admit: 2021-10-28 | Discharge: 2021-10-28 | Disposition: A | Discharge status: Home / Self Care | Payer: Medicare Other | Source: Ambulatory Visit | Attending: Nurse Practitioner | Admitting: Nurse Practitioner

## 2021-10-28 DIAGNOSIS — M5414 Radiculopathy, thoracic region: Secondary | ICD-10-CM

## 2022-02-04 ENCOUNTER — Emergency Department
Admission: EM | Admit: 2022-02-04 | Discharge: 2022-02-04 | Disposition: A | Discharge status: Home / Self Care | Payer: Medicare Other | Attending: Emergency Medicine | Admitting: Emergency Medicine

## 2022-02-04 DIAGNOSIS — I129 Hypertensive chronic kidney disease with stage 1 through stage 4 chronic kidney disease, or unspecified chronic kidney disease: Secondary | ICD-10-CM | POA: Insufficient documentation

## 2022-02-04 DIAGNOSIS — R109 Unspecified abdominal pain: Secondary | ICD-10-CM | POA: Diagnosis not present

## 2022-02-04 DIAGNOSIS — R197 Diarrhea, unspecified: Secondary | ICD-10-CM | POA: Diagnosis not present

## 2022-02-04 DIAGNOSIS — Z20822 Contact with and (suspected) exposure to covid-19: Secondary | ICD-10-CM | POA: Diagnosis not present

## 2022-02-04 DIAGNOSIS — E860 Dehydration: Secondary | ICD-10-CM | POA: Insufficient documentation

## 2022-02-04 DIAGNOSIS — R110 Nausea: Secondary | ICD-10-CM | POA: Diagnosis not present

## 2022-02-04 DIAGNOSIS — E1122 Type 2 diabetes mellitus with diabetic chronic kidney disease: Secondary | ICD-10-CM | POA: Diagnosis not present

## 2022-02-04 DIAGNOSIS — N189 Chronic kidney disease, unspecified: Secondary | ICD-10-CM | POA: Insufficient documentation

## 2022-02-04 DIAGNOSIS — I2510 Atherosclerotic heart disease of native coronary artery without angina pectoris: Secondary | ICD-10-CM | POA: Diagnosis not present

## 2022-02-04 LAB — CBC WITH DIFFERENTIAL/PLATELET
Abs Immature Granulocytes: 0.01 10*3/uL (ref 0.00–0.07)
Basophils Absolute: 0 10*3/uL (ref 0.0–0.1)
Basophils Relative: 1 %
Eosinophils Absolute: 0.1 10*3/uL (ref 0.0–0.5)
Eosinophils Relative: 2 %
HCT: 33.9 % — ABNORMAL LOW (ref 36.0–46.0)
Hemoglobin: 10.9 g/dL — ABNORMAL LOW (ref 12.0–15.0)
Immature Granulocytes: 0 %
Lymphocytes Relative: 18 %
Lymphs Abs: 1.4 10*3/uL (ref 0.7–4.0)
MCH: 30.5 pg (ref 26.0–34.0)
MCHC: 32.2 g/dL (ref 30.0–36.0)
MCV: 95 fL (ref 80.0–100.0)
Monocytes Absolute: 0.6 10*3/uL (ref 0.1–1.0)
Monocytes Relative: 8 %
Neutro Abs: 5.5 10*3/uL (ref 1.7–7.7)
Neutrophils Relative %: 71 %
Platelets: 190 10*3/uL (ref 150–400)
RBC: 3.57 MIL/uL — ABNORMAL LOW (ref 3.87–5.11)
RDW: 12.4 % (ref 11.5–15.5)
WBC: 7.7 10*3/uL (ref 4.0–10.5)
nRBC: 0 % (ref 0.0–0.2)

## 2022-02-04 LAB — BASIC METABOLIC PANEL
Anion gap: 8 (ref 5–15)
BUN: 38 mg/dL — ABNORMAL HIGH (ref 8–23)
CO2: 22 mmol/L (ref 22–32)
Calcium: 9.1 mg/dL (ref 8.9–10.3)
Chloride: 108 mmol/L (ref 98–111)
Creatinine, Ser: 1.84 mg/dL — ABNORMAL HIGH (ref 0.44–1.00)
GFR, Estimated: 29 mL/min — ABNORMAL LOW (ref >60)
Glucose, Bld: 175 mg/dL — ABNORMAL HIGH (ref 70–99)
Potassium: 4.7 mmol/L (ref 3.5–5.1)
Sodium: 138 mmol/L (ref 135–145)

## 2022-02-04 LAB — HEPATIC FUNCTION PANEL
ALT: 81 U/L — ABNORMAL HIGH (ref 0–44)
AST: 48 U/L — ABNORMAL HIGH (ref 15–41)
Albumin: 4 g/dL (ref 3.5–5.0)
Alkaline Phosphatase: 98 U/L (ref 38–126)
Bilirubin, Direct: <0.1 mg/dL (ref 0.0–0.2)
Total Bilirubin: 0.7 mg/dL (ref 0.3–1.2)
Total Protein: 7.1 g/dL (ref 6.5–8.1)

## 2022-02-04 LAB — URINALYSIS, ROUTINE W REFLEX MICROSCOPIC
Bilirubin Urine: NEGATIVE
Glucose, UA: NEGATIVE mg/dL
Hgb urine dipstick: NEGATIVE
Ketones, ur: NEGATIVE mg/dL
Nitrite: NEGATIVE
Protein, ur: NEGATIVE mg/dL
Specific Gravity, Urine: 1.009 (ref 1.005–1.030)
pH: 5 (ref 5.0–8.0)

## 2022-02-04 LAB — RESP PANEL BY RT-PCR (FLU A&B, COVID) ARPGX2
Influenza A by PCR: NEGATIVE
Influenza B by PCR: NEGATIVE
SARS Coronavirus 2 by RT PCR: NEGATIVE

## 2022-02-04 LAB — LIPASE, BLOOD: Lipase: 26 U/L (ref 11–51)

## 2022-02-04 MED ORDER — LACTATED RINGERS IV BOLUS
1000.0000 mL | Freq: Once | INTRAVENOUS | Status: AC
  Administered 2022-02-04: 1000 mL via INTRAVENOUS

## 2022-02-04 MED ORDER — ONDANSETRON HCL 4 MG/2ML IJ SOLN
4.0000 mg | Freq: Once | INTRAMUSCULAR | Status: AC
  Administered 2022-02-04: 4 mg via INTRAVENOUS
  Filled 2022-02-04: qty 2

## 2022-02-04 MED ORDER — DIPHENOXYLATE-ATROPINE 2.5-0.025 MG PO TABS: 1.0000 | ORAL_TABLET | Freq: Four times a day (QID) | ORAL | 0 refills | Status: AC | PRN

## 2022-02-04 NOTE — ED Triage Notes (Signed)
Pt arrives with c/o diarrhea since last Saturday. Per pt. She feels fatigued and weak. Pt endorses nausea, but not vomiting.

## 2022-02-04 NOTE — ED Provider Notes (Signed)
[facility] Provider Note    Event Date/Time   First MD Initiated Contact with Patient 02/04/22 1736     (approximate)   History   Chief Complaint Diarrhea   HPI  Dorothy Rogers is a 71 y.o. female with past medical history of hypertension, hyperlipidemia, diabetes, CAD, and CKD who presents to the ED complaining of diarrhea.  Patient reports that for the past 5 days she has been dealing with frequent watery diarrhea, has not noticed any blood in her stool.  She has had some crampy pain in her abdomen, also reports some nausea, but has not had any vomiting.  She denies any fevers and has not had any recent travel outside of the country.  She does states she recently completed a course of Keflex after having leads placed in her back for a spinal stimulator.  She has been taking loperamide at home without significant relief.     Physical Exam   Triage Vital Signs: ED Triage Vitals [02/04/22 1600]  Enc Vitals Group     BP 126/68     Pulse Rate 79     Resp 16     Temp 98.4 F (36.9 C)     Temp Source Oral     SpO2 99 %     Weight      Height      Head Circumference      Peak Flow      Pain Score 0     Pain Loc      Pain Edu?      Excl. in [place]?     Most recent vital signs: Vitals:   02/04/22 1924 02/04/22 2030  BP: (!) 118/59 (!) 121/53  Pulse: 74 71  Resp: 16 16  Temp:    SpO2: 98% 97%    Constitutional: Alert and oriented. Eyes: Conjunctivae are normal. Head: Atraumatic. Nose: No congestion/rhinnorhea. Mouth/Throat: Mucous membranes are dry.  Cardiovascular: Normal rate, regular rhythm. Grossly normal heart sounds.  2+ radial pulses bilaterally. Respiratory: Normal respiratory effort.  No retractions. Lungs CTAB. Gastrointestinal: Soft and nontender. No distention. Musculoskeletal: No lower extremity tenderness nor edema.  Neurologic:  Normal speech and language. No gross focal neurologic deficits are appreciated.    ED Results /  Procedures / Treatments   Labs (all labs ordered are listed, but only abnormal results are displayed) Labs Reviewed  CBC WITH DIFFERENTIAL/PLATELET - Abnormal; Notable for the following components:      Result Value   RBC 3.57 (*)    Hemoglobin 10.9 (*)    HCT 33.9 (*)    All other components within normal limits  BASIC METABOLIC PANEL - Abnormal; Notable for the following components:   Glucose, Bld 175 (*)    BUN 38 (*)    Creatinine, Ser 1.84 (*)    GFR, Estimated 29 (*)    All other components within normal limits  HEPATIC FUNCTION PANEL - Abnormal; Notable for the following components:   AST 48 (*)    ALT 81 (*)    All other components within normal limits  URINALYSIS, ROUTINE W REFLEX MICROSCOPIC - Abnormal; Notable for the following components:   Color, Urine YELLOW (*)    APPearance HAZY (*)    Leukocytes,Ua LARGE (*)    Bacteria, UA RARE (*)    All other components within normal limits  RESP PANEL BY RT-PCR (FLU A&B, COVID) [id number]  GASTROINTESTINAL PANEL BY PCR, STOOL (REPLACES STOOL CULTURE)  C DIFFICILE  QUICK SCREEN W PCR REFLEX    LIPASE, BLOOD    PROCEDURES:  Critical Care performed: No  Procedures   MEDICATIONS ORDERED IN ED: Medications  lactated ringers bolus 1,000 mL (0 mLs Intravenous Stopped 02/04/22 1925)  ondansetron (ZOFRAN) injection 4 mg (4 mg Intravenous Given 02/04/22 1808)     IMPRESSION / MDM / ASSESSMENT AND PLAN / ED COURSE  I reviewed the triage vital signs and the nursing notes.                              71 y.o. female with past medical history of hypertension, hyperlipidemia, diabetes, CAD, and CKD who presents to the ED with frequent watery diarrhea for the past 5 days after recent course of antibiotics.  Patient's presentation is most consistent with acute presentation with potential threat to life or bodily function.  Differential diagnosis includes, but is not limited to, dehydration, lecture abnormality, AKI, anemia, GI  bleed, viral gastroenteritis, C. difficile.  Patient nontoxic-appearing and in no acute distress, vital signs are unremarkable.  She has a benign abdominal exam but does appear slightly dehydrated.  Labs are reassuring with stable chronic kidney disease, no significant electrolyte abnormality, anemia, or leukocytosis noted.  Testing for COVID-19 and influenza is negative, with her recent antibiotic she would benefit from stool studies for possible C. difficile.  We will hydrate with IV fluids and treat symptomatically with Zofran while awaiting stool studies.  Patient reports feeling better following IV fluids and Zofran, she has been unable to provide stool sample despite being here in the ED for multiple hours.  Overall suspicion for C. difficile is low at this time and patient is appropriate for discharge home with PCP follow-up.  She will be prescribed Lomotil for use as needed, was counseled to return to the ED for new or worsening symptoms.  Patient agrees with plan.      FINAL CLINICAL IMPRESSION(S) / ED DIAGNOSES   Final diagnoses:  Diarrhea, unspecified type     Rx / DC Orders   ED Discharge Orders          Ordered    diphenoxylate-atropine (LOMOTIL) 2.5-0.025 MG tablet  4 times daily PRN        02/04/22 2132             Note:  This document was prepared using Dragon voice recognition software and may include unintentional dictation errors.   Blake Divine, MD 02/04/22 2133

## 2022-02-04 NOTE — ED Notes (Signed)
ED Provider at bedside. 

## 2022-02-04 NOTE — ED Provider Triage Note (Signed)
Emergency Medicine Provider Triage Evaluation Note  Dorothy Rogers, a 71 y.o. female  was evaluated in triage.  Pt complains of diarrhea. She notes onset last Saturday. She reports fatigue and decreased appetite.   Review of Systems  Positive: Diarrhea, dehydration Negative: FCS  Physical Exam  BP 126/68   Pulse 79   Temp 98.4 F (36.9 C) (Oral)   Resp 16   SpO2 99%  Gen:   Awake, no distress  NAD Resp:  Normal effort CTA MSK:   Moves extremities without difficulty  Other:  Soft, nontender  Medical Decision Making  Medically screening exam initiated at 4:05 PM.  Appropriate orders placed.  Dorothy Rogers was informed that the remainder of the evaluation will be completed by another provider, this initial triage assessment does not replace that evaluation, and the importance of remaining in the ED until their evaluation is complete.  Patient to the ED for several days of diarrhea with associated dehydration, weakness which denies any fevers, chills, or sweats.   Melvenia Needles, PA-C 02/04/22 1610

## 2022-02-04 NOTE — ED Notes (Signed)
Signing pad did not work, pt verbalized understanding of DC instructions. 

## 2022-02-06 LAB — GASTROINTESTINAL PANEL BY PCR, STOOL (REPLACES STOOL CULTURE)

## 2022-02-06 LAB — C DIFFICILE QUICK SCREEN W PCR REFLEX
C Diff antigen: POSITIVE — AB
C Diff toxin: NEGATIVE

## 2022-02-06 LAB — CLOSTRIDIUM DIFFICILE BY PCR, REFLEXED: Toxigenic C. Difficile by PCR: POSITIVE — AB

## 2022-02-09 ENCOUNTER — Other Ambulatory Visit: Payer: Self-pay | Admitting: Internal Medicine

## 2022-02-09 DIAGNOSIS — Z1231 Encounter for screening mammogram for malignant neoplasm of breast: Secondary | ICD-10-CM

## 2022-02-28 HISTORY — PX: SPINAL CORD STIMULATOR IMPLANT: SHX2422

## 2022-03-09 ENCOUNTER — Ambulatory Visit
Admission: RE | Admit: 2022-03-09 | Discharge: 2022-03-09 | Disposition: A | Discharge status: Home / Self Care | Payer: Medicare Other | Source: Ambulatory Visit | Attending: Internal Medicine | Admitting: Internal Medicine

## 2022-03-09 DIAGNOSIS — Z1231 Encounter for screening mammogram for malignant neoplasm of breast: Secondary | ICD-10-CM | POA: Insufficient documentation

## 2022-08-07 ENCOUNTER — Emergency Department: Payer: Medicare Other

## 2022-08-07 ENCOUNTER — Other Ambulatory Visit: Payer: Self-pay

## 2022-08-07 ENCOUNTER — Emergency Department
Admission: EM | Admit: 2022-08-07 | Discharge: 2022-08-07 | Disposition: A | Discharge status: Home / Self Care | Payer: Medicare Other | Attending: Emergency Medicine | Admitting: Emergency Medicine

## 2022-08-07 ENCOUNTER — Ambulatory Visit
Admission: EM | Admit: 2022-08-07 | Discharge: 2022-08-07 | Disposition: A | Discharge status: Home / Self Care | Payer: Medicare Other

## 2022-08-07 DIAGNOSIS — I10 Essential (primary) hypertension: Secondary | ICD-10-CM | POA: Diagnosis not present

## 2022-08-07 DIAGNOSIS — E119 Type 2 diabetes mellitus without complications: Secondary | ICD-10-CM | POA: Insufficient documentation

## 2022-08-07 DIAGNOSIS — R519 Headache, unspecified: Secondary | ICD-10-CM | POA: Diagnosis present

## 2022-08-07 DIAGNOSIS — M5481 Occipital neuralgia: Secondary | ICD-10-CM | POA: Diagnosis not present

## 2022-08-07 LAB — CBC
HCT: 35.2 % — ABNORMAL LOW (ref 36.0–46.0)
Hemoglobin: 11.5 g/dL — ABNORMAL LOW (ref 12.0–15.0)
MCH: 30.9 pg (ref 26.0–34.0)
MCHC: 32.7 g/dL (ref 30.0–36.0)
MCV: 94.6 fL (ref 80.0–100.0)
Platelets: 197 10*3/uL (ref 150–400)
RBC: 3.72 MIL/uL — ABNORMAL LOW (ref 3.87–5.11)
RDW: 12.8 % (ref 11.5–15.5)
WBC: 8 10*3/uL (ref 4.0–10.5)
nRBC: 0 % (ref 0.0–0.2)

## 2022-08-07 LAB — BASIC METABOLIC PANEL
Anion gap: 5 (ref 5–15)
BUN: 35 mg/dL — ABNORMAL HIGH (ref 8–23)
CO2: 21 mmol/L — ABNORMAL LOW (ref 22–32)
Calcium: 8.4 mg/dL — ABNORMAL LOW (ref 8.9–10.3)
Chloride: 107 mmol/L (ref 98–111)
Creatinine, Ser: 1.62 mg/dL — ABNORMAL HIGH (ref 0.44–1.00)
GFR, Estimated: 34 mL/min — ABNORMAL LOW (ref >60)
Glucose, Bld: 116 mg/dL — ABNORMAL HIGH (ref 70–99)
Potassium: 4 mmol/L (ref 3.5–5.1)
Sodium: 133 mmol/L — ABNORMAL LOW (ref 135–145)

## 2022-08-07 MED ORDER — BUPIVACAINE HCL (PF) 0.5 % IJ SOLN
20.0000 mL | Freq: Once | INTRAMUSCULAR | Status: AC
  Administered 2022-08-07: 20 mL

## 2022-08-07 NOTE — ED Notes (Signed)
Patient is being discharged from the Urgent Care and sent to the [facility] Emergency Department via private vehicle with spouse . Per Margarette Canada, NP, patient is in need of higher level of care due to severe headache. Patient is aware and verbalizes understanding of plan of care.  Vitals:   08/07/22 1519  BP: (!) 140/65  Pulse: (!) 110  Resp: 16  Temp: 98.1 F (36.7 C)  SpO2: 100%

## 2022-08-07 NOTE — ED Provider Notes (Signed)
[facility] Provider Note    Event Date/Time   First MD Initiated Contact with Patient 08/07/22 1640     (approximate)  History   Chief Complaint: Headache  HPI  Dorothy Rogers is a 72 y.o. female with a past medical history of anemia, arthritis, diabetes, hypertension presents emergency department for right-sided headache.  According to the patient for the past 4 to 5 days she has had a intermittent sharp shocklike pain to the right side of her head above her ear.  States it only happens every several minutes and will last less than a second but is a shocklike discomfort.  Patient states she has had this before on the left side approximately 4 to 5 years ago and resolved after her neurologist did an injection to the area.  Patient states they have tried to prescribe her gabapentin in the past but she could not tolerate the medication.  Patient has been using ibuprofen without relief.  Physical Exam   Triage Vital Signs: ED Triage Vitals [08/07/22 1619]  Enc Vitals Group     BP (!) 156/81     Pulse Rate (!) 106     Resp 16     Temp 97.9 F (36.6 C)     Temp Source Oral     SpO2 98 %     Weight 175 lb 14.8 oz (79.8 kg)     Height '5\' 2"'$  (1.575 m)     Head Circumference      Peak Flow      Pain Score 10     Pain Loc      Pain Edu?      Excl. in [place]?     Most recent vital signs: Vitals:   08/07/22 1619  BP: (!) 156/81  Pulse: (!) 106  Resp: 16  Temp: 97.9 F (36.6 C)  SpO2: 98%    General: Awake, no distress.  CV:  Good peripheral perfusion.  Regular rate and rhythm  Resp:  Normal effort.  Equal breath sounds bilaterally.  Abd:  No distention.  Soft, nontender.    ED Results / Procedures / Treatments   RADIOLOGY  I have reviewed and interpreted the CT images.  No bleed seen on my evaluation. Radiology is read the CT scan is negative for acute abnormality.   MEDICATIONS ORDERED IN ED: Medications  bupivacaine(PF) (MARCAINE) 0.5 %  injection 20 mL (20 mLs Infiltration Given 08/07/22 1721)     IMPRESSION / MDM / ASSESSMENT AND PLAN / ED COURSE  I reviewed the triage vital signs and the nursing notes.  Patient's presentation is most consistent with acute presentation with potential threat to life or bodily function.  Patient presents emergency department for intermittent sharp shocklike pains lasting less than a second occurring every several minutes to the right side of her head.  Symptoms are very suggestive of occipital neuralgia.  CT scan showed no acute findings.  CBC is normal and chemistry shows no significant acute finding.  As the patient cannot tolerate gabapentin in the past I performed an occipital nerve block to the base of the occipital scalp on the right side with 0.5% bupivacaine.  Reassessed after approximately 30 minutes patient states significant reduction in symptoms.  We will discharge the patient have her follow-up with her neurologist.  Patient agreeable to plan of care.  FINAL CLINICAL IMPRESSION(S) / ED DIAGNOSES   Occipital neuralgia    Note:  This document was prepared using Dragon voice recognition  software and may include unintentional dictation errors.   Harvest Dark, MD 08/07/22 [phone]

## 2022-08-07 NOTE — ED Provider Notes (Signed)
[facility]    CSN: [id number] Arrival date & time: 08/07/22  1508      History   Chief Complaint Chief Complaint  Patient presents with   Headache    HPI Dorothy Rogers is a 72 y.o. female.   HPI  72 year old female here for evaluation of right temple pain.  The patient has a past medical history that is significant for multilevel degenerative disc disease, hypertension, hyperlipidemia, type 2 diabetes, CKD, CAD presenting for evaluation of 3 days worth of sharp headaches in the right temporal region that is associated with light sensitivity.  She denies changes in vision, numbness, tingling, or weakness in her extremities, nausea or vomiting, or neck pain.  She states that she had something similar several years ago but it was behind her left ear that was evaluated by neurology and it was thought to be coming from degeneration in her cervical spine.  This pain is in a different location but feels largely the same.  Past Medical History:  Diagnosis Date   Anemia    Anemia    Arthritis    lower back, hips   Blood transfusion without reported diagnosis    CAD (coronary artery disease) FEB AND NOV 2009   6 STENTS   Cancer [facility])    tumor back of neck-fibroushistocytoma   Cataract    Chronic kidney disease    Stage III   Coronary artery disease    DDD (degenerative disc disease), lumbar    Diabetes mellitus without complication ([place])    TYPE 2   Herpes simplex    Hyperlipidemia    Hypertension    CONTROLLED ON MEDS   Intractable nausea and vomiting 06/09/2015   Vertigo    occasional, no episodes in 2-3 months   Vertigo    OCCASIONALLY   Vitamin B 12 deficiency     Patient Active Problem List   Diagnosis Date Noted   Asthma 07/31/2019   Esophageal dysphagia    Chronic left hip pain 05/17/2018   Elevated erythrocyte sedimentation rate 05/17/2018   OSA (obstructive sleep apnea) 12/07/2017   Bilateral sciatica 11/29/2016   Gastroparesis due to DM  ([place]) 11/26/2016   Cholelithiasis    Protein-calorie malnutrition, severe 05/18/2016   Supraventricular tachycardia 05/05/2016   History of paroxysmal supraventricular tachycardia 05/05/2016   SOB (shortness of breath)    Near syncope 04/07/2016   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 difficile colitis 06/16/2015   Intractable nausea and vomiting 06/09/2015   Dehydration 06/09/2015   Type 2 diabetes mellitus with diabetic neuropathy ([place]) 06/09/2015   Hyperlipemia 06/09/2015   Hypertension 06/09/2015   CAD (coronary artery disease) 06/09/2015   CKD (chronic kidney disease), stage III ([place]) 06/09/2015   Thrombocytopenia ([place]) 05/30/2015   Lumbar canal stenosis 05/30/2015   Disc disease with myelopathy, lumbar 05/30/2015   Presence of stent in coronary artery 05/30/2015   Sacroiliac joint dysfunction 02/10/2015   Status post lumbar spinal fusion 12/25/2014   DDD (degenerative disc disease), lumbar 11/18/2014   Lumbar post-laminectomy syndrome 11/18/2014   Facet syndrome, lumbar 11/18/2014   Piriformis syndrome of left side 11/18/2014   Status post lumbar spine operation 05/09/2014   Adiposity 04/23/2014   Thoracic and lumbosacral neuritis 03/27/2013  H/O arthrodesis 03/27/2013   Peripheral vascular disease ([place]) 03/22/2013   Hypomagnesemia 03/22/2013   Absolute anemia 03/22/2013    Past Surgical History:  Procedure Laterality Date   14 HOUR [place] STUDY N/A 02/04/2016   Procedure: 24 HOUR PH STUDY;  Surgeon: Lucilla Lame, MD;  Location: [facility] ENDOSCOPY;  Service: Endoscopy;  Laterality: N/A;   ABDOMINAL HYSTERECTOMY     BACK SURGERY     Lumbar spinal fusion 05/30/2015   CARDIAC CATHETERIZATION  2009   CATARACT EXTRACTION Left    CATARACT  EXTRACTION W/PHACO Right 03/19/2015   Procedure: CATARACT EXTRACTION PHACO AND INTRAOCULAR LENS PLACEMENT ([place]);  Surgeon: Leandrew Koyanagi, MD;  Location: [employer];  Service: Ophthalmology;  Laterality: Right;  DIABETIC - insulin and oral meds   CHOLECYSTECTOMY N/A 05/19/2016   Procedure: LAPAROSCOPIC CHOLECYSTECTOMY;  Surgeon: Clayburn Pert, MD;  Location: [facility] ORS;  Service: General;  Laterality: N/A;   CORONARY STENT PLACEMENT  03/2008   CYSTECTOMY Right    wrist   ESOPHAGEAL MANOMETRY N/A 02/04/2016   Procedure: ESOPHAGEAL MANOMETRY (EM);  Surgeon: Lucilla Lame, MD;  Location: [facility] ENDOS[facility];  Service: Endoscopy;  Laterality: N/A;   ESOPHAGOGASTRODUODENOSCOPY (EGD) WITH PROPOFOL N/A 09/04/2015   Procedure: ESOPHAGOGASTRODUODENOSCOPY (EGD) WITH PROPOFOL;  Surgeon: Lucilla Lame, MD;  Location: [place];  Service: Endoscopy;  Laterality: N/A;  INSULIN DEPENDENT DIABETIC   ESOPHAGOGASTRODUODENOSCOPY (EGD) WITH PROPOFOL N/A 06/29/2016   Procedure: ESOPHAGOGASTRODUODENOSCOPY (EGD) WITH PROPOFOL;  Surgeon: Jonathon Bellows, MD;  Location: [facility] END[facility];  Service: Endoscopy;  Laterality: N/A;   ESOPHAGOGASTRODUODENOSCOPY (EGD) WITH PROPOFOL N/A 01/25/2019   Procedure: ESOPHAGOGASTRODUODENOSCOPY (EGD) WITH PROPOFOL;  Surgeon: Jonathon Bellows, MD;  Location: [facility] END[facility]COPY;  Service: Gastroenterology;  Laterality: N/A;   EYE SURGERY Left    cataract extraction   HIATAL HERNIA REPAIR  02/2016   Dr Adonis Huguenin   LEFT HEART CATH AND CORONARY ANGIOGRAPHY Left 08/18/2017   Procedure: LEFT HEART CATH AND CORONARY ANGIOGRAPHY;  Surgeon: Teodoro Spray, MD;  Location: [place] [facility];  Service: Cardiovascular;  Laterality: Left;   LUMBAR FUSION  2001, 2014, 2015   RIGHT HEART CATH N/A 05/10/2019   Procedure: RIGHT HEART CATH;  Surgeon: Teodoro Spray, MD;  Location: [place] [facility];  Service: Cardiovascular;  Laterality: N/A;   [place]     2001, 2014, 2015   TUMOR EXCISION      back of neck    OB History   No obstetric history on file.      Home Medications    Prior to Admission medications   Medication Sig Start Date End Date Taking? Authorizing Provider  acetaminophen (TYLENOL) 500 MG tablet Take 1,000 mg by mouth every 6 (six) hours as needed for mild pain or moderate pain.    Yes [provider]  acyclovir (ZOVIRAX) 400 MG tablet Take 400 mg by mouth daily as needed (for fever blisters).  08/07/15  Yes [provider]  aspirin EC 81 MG EC tablet Take 1 tablet (81 mg total) by mouth daily. Patient taking differently: Take 81 mg by mouth at bedtime. 05/21/16  Yes Gouru, Illene Silver, MD  atorvastatin (LIPITOR) 40 MG tablet Take 1 tablet by mouth once daily 08/11/19  Yes [provider]  Biotin 10 MG TABS Take by mouth.   Yes [provider]  Calcium Carb-Cholecalciferol (CALTRATE 600+D3 PO) Take 1 tablet by mouth daily.   Yes [provider]  Calcium Carbonate-Vitamin D 600-400 MG-UNIT tablet Take by mouth.   Yes  [provider]  CARTIA XT 120 MG 24 hr capsule Take 120 mg by mouth daily. 12/13/17  Yes [provider]  Cholecalciferol (VITAMIN D-3) 125 MCG (5000 UT) TABS Take 5,000 Units by mouth daily.   Yes [provider]  clopidogrel (PLAVIX) 75 MG tablet Take 1 tablet by mouth once daily 09/30/19  Yes [provider]  conjugated estrogens (PREMARIN) vaginal cream Place 0.5 g vaginally 2 (two) times a week.   Yes [provider]  Cyanocobalamin (VITAMIN B-12) 5000 MCG SUBL Place 5,000 mcg under the tongue daily.   Yes [provider]  diazepam (VALIUM) 2 MG tablet Take by mouth. 06/05/19  Yes [provider]  diphenoxylate-atropine (LOMOTIL) 2.5-0.025 MG tablet Take 1 tablet by mouth 4 (four) times daily as needed for diarrhea or loose stools. 02/04/22 02/04/23 Yes Blake Divine, MD  ferrous sulfate 325 (65 FE) MG tablet Take 325 mg by mouth daily with breakfast.    Yes [provider]  glipiZIDE (GLUCOTROL) 10 MG tablet Take 10 mg by mouth 2 (two) times daily. Before breakfast & before supper   Yes [provider]  ibuprofen (ADVIL) 200 MG tablet Take 400 mg by mouth every 8 (eight) hours as needed (for pain.).   Yes [provider]  insulin glargine (LANTUS SOLOSTAR) 100 UNIT/ML Solostar Pen INJECT 36 TO 50 UNITS SUBCUTANEOUSLY ONCE DAILY AS DIRECTED 08/02/19  Yes [provider]  insulin lispro (HUMALOG) 100 UNIT/ML injection Inject into the skin 3 (three) times daily before meals. Sliding scale   Yes [provider]  loperamide (IMODIUM) 2 MG capsule Take by mouth.   Yes [provider]  losartan (COZAAR) 100 MG tablet Take 1 tablet by mouth once daily 08/27/19  Yes [provider]  melatonin 1 MG TABS tablet Take by mouth.   Yes [provider]  ondansetron (ZOFRAN-ODT) 8 MG disintegrating tablet Take 8 mg by mouth as needed for nausea or vomiting.   Yes [provider]  pantoprazole (PROTONIX) 20 MG tablet Take by mouth. 07/22/20  Yes [provider]  Probiotic CAPS Take 1 capsule by mouth daily.   Yes [provider]  promethazine (PHENERGAN) 12.5 MG tablet Take 1 tablet (12.5 mg total) by mouth every 6 (six) hours as needed for nausea or vomiting. 05/13/20  Yes Blake Divine, MD  modafinil (PROVIGIL) 100 MG tablet Take by mouth. 10/09/19 05/12/20  [provider]    Family History Family History  Problem Relation Age of Onset   Heart disease Father    Diabetes Other    Breast cancer Neg Hx     Social History Social History   Tobacco Use   Smoking status: Never   Smokeless tobacco: Never  Vaping Use   Vaping Use: Never used  Substance Use Topics   Alcohol use: Yes    Alcohol/week: 0.0 standard drinks of alcohol    Comment: 2-3 drinks per year   Drug use: Never     Allergies   Sildenafil, Tramadol, Beta adrenergic blockers,  Carbamazepine, Celebrex [celecoxib], Gabapentin, Niacin and related, Oxycodone, Shellfish allergy, Vicodin [hydrocodone-acetaminophen], and Heparin   Review of Systems Review of Systems  Eyes:  Positive for photophobia. Negative for visual disturbance.  Musculoskeletal:  Negative for neck pain.  Neurological:  Positive for headaches. Negative for weakness and numbness.  Hematological: Negative.      Physical Exam Triage Vital Signs ED Triage Vitals  Enc Vitals Group     BP 08/07/22  1519 (!) 140/65     Pulse Rate 08/07/22 1519 (!) 110     Resp 08/07/22 1519 16     Temp 08/07/22 1519 98.1 F (36.7 C)     Temp Source 08/07/22 1519 Oral     SpO2 08/07/22 1519 100 %     Weight 08/07/22 1518 176 lb (79.8 kg)     Height 08/07/22 1518 5' 2.5" (1.588 m)     Head Circumference --      Peak Flow --      Pain Score 08/07/22 1517 10     Pain Loc --      Pain Edu? --      Excl. in [place]? --    No data found.  Updated Vital Signs BP (!) 140/65 (BP Location: Left Arm)   Pulse (!) 110   Temp 98.1 F (36.7 C) (Oral)   Resp 16   Ht 5' 2.5" (1.588 m)   Wt 176 lb (79.8 kg)   SpO2 100%   BMI 31.68 kg/m   Visual Acuity Right Eye Distance:   Left Eye Distance:   Bilateral Distance:    Right Eye Near:   Left Eye Near:    Bilateral Near:     Physical Exam Vitals and nursing note reviewed.  Constitutional:      Appearance: Normal appearance. She is not ill-appearing.  Cardiovascular:     Rate and Rhythm: Normal rate and regular rhythm.     Pulses: Normal pulses.     Heart sounds: Normal heart sounds. No murmur heard.    No friction rub. No gallop.  Pulmonary:     Effort: Pulmonary effort is normal.     Breath sounds: Normal breath sounds. No wheezing, rhonchi or rales.  Skin:    General: Skin is warm and dry.     Capillary Refill: Capillary refill takes less than 2 seconds.  Neurological:     General: No focal deficit present.     Mental Status: She is alert and oriented to  person, place, and time.     Cranial Nerves: No cranial nerve deficit.     Motor: No weakness.     Coordination: Coordination normal.     Deep Tendon Reflexes: Reflexes normal.      UC Treatments / Results  Labs (all labs ordered are listed, but only abnormal results are displayed) Labs Reviewed - No data to display  EKG   Radiology No results found.  Procedures Procedures (including critical care time)  Medications Ordered in UC Medications - No data to display  Initial Impression / Assessment and Plan / UC Course  I have reviewed the triage vital signs and the nursing notes.  Pertinent labs & imaging results that were available during my care of the patient were reviewed by me and considered in my medical decision making (see chart for details).   Patient is a pleasant 72 year old female with significant history of spinal degenerative changes presents for evaluation of 3 days worth of sharp stabbing pain to the right temple.  She states that they happen all throughout the day and night and is interfering with her sleep.  She has tried taking ibuprofen without any improvement of her symptoms.  She states that it would be quick sharp pains that will come multiple times a minute and have been unabating.  These are associated with light sensitivity but she denies any blurry vision or double vision.  She has been evaluated by neurology in the past  for something similar but it was on the left side and the pain was behind her ear.  At that time was attributed to coming from her neck.  This is in her right temple region.  When doing a neurological exam and checking her cranial nerves extraocular movement did trigger the pain in her right temple.  She is moving all extremities equally and her grip, upper extremity strength, and lower extremity strength are all 5/5.  Given that the patient is having pain in her right temple region and its sharp stabbing pain there is concern for temporal  arteritis, aneurysm, and trigeminal neuralgia.  I have advised the patient that she needs to be evaluated in the emergency department and have imaging of her brain.  She states that she will go to [facility]  She left ambulatory in stable condition via POV.   Final Clinical Impressions(s) / UC Diagnoses   Final diagnoses:  Temporal pain     Discharge Instructions      Please go to the ER at [facility] for further evaluation of the pain in your right temple.     ED Prescriptions   None    PDMP not reviewed this encounter.   Margarette Canada, NP 08/07/22 [phone]

## 2022-08-07 NOTE — Discharge Instructions (Addendum)
Please go to the ER at [facility] for further evaluation of the pain in your right temple.

## 2022-08-07 NOTE — ED Triage Notes (Addendum)
Pt to ED from [facility] UC transfer. Pt is having sharp right sided pain in her head that started several days ago. Pt denies blurred vision but noticed increase in light sensitivity.   Pt was sent over via UC for head CT and imaging.

## 2022-08-07 NOTE — ED Triage Notes (Signed)
Pt c/o R sided sharp pain in head x3 days, along w/light sensitivity. Denies any blurred vision,nausea,emesis or ringing in ears. Pains last for few seconds at last up to a min.

## 2022-08-08 ENCOUNTER — Emergency Department
Admission: EM | Admit: 2022-08-08 | Discharge: 2022-08-09 | Disposition: A | Discharge status: Home / Self Care | Payer: Medicare Other | Attending: Emergency Medicine | Admitting: Emergency Medicine

## 2022-08-08 ENCOUNTER — Other Ambulatory Visit: Payer: Self-pay

## 2022-08-08 DIAGNOSIS — R519 Headache, unspecified: Secondary | ICD-10-CM | POA: Diagnosis present

## 2022-08-08 DIAGNOSIS — G500 Trigeminal neuralgia: Secondary | ICD-10-CM | POA: Insufficient documentation

## 2022-08-08 MED ORDER — CLONAZEPAM 0.5 MG PO TABS
0.5000 mg | ORAL_TABLET | Freq: Once | ORAL | Status: AC
Start: 2022-08-09 — End: 2022-08-09
  Administered 2022-08-09: 0.5 mg via ORAL
  Filled 2022-08-08: qty 1

## 2022-08-08 MED ORDER — GABAPENTIN 100 MG PO CAPS
100.0000 mg | ORAL_CAPSULE | Freq: Once | ORAL | Status: AC
Start: 2022-08-09 — End: 2022-08-09
  Administered 2022-08-09: 100 mg via ORAL
  Filled 2022-08-08: qty 1

## 2022-08-08 MED ORDER — CARBAMAZEPINE 100 MG PO CHEW
100.0000 mg | CHEWABLE_TABLET | Freq: Once | ORAL | Status: AC
  Administered 2022-08-09: 100 mg via ORAL
  Filled 2022-08-08: qty 1

## 2022-08-08 MED ORDER — ONDANSETRON 4 MG PO TBDP
4.0000 mg | ORAL_TABLET | Freq: Once | ORAL | Status: AC
  Administered 2022-08-09: 4 mg via ORAL
  Filled 2022-08-08: qty 1

## 2022-08-08 MED ORDER — BUTALBITAL-APAP-CAFFEINE 50-325-40 MG PO TABS
2.0000 | ORAL_TABLET | Freq: Once | ORAL | Status: AC
  Administered 2022-08-09: 2 via ORAL
  Filled 2022-08-08: qty 2

## 2022-08-08 NOTE — ED Provider Notes (Signed)
[facility] Provider Note    Event Date/Time   First MD Initiated Contact with Patient 08/08/22 2319     (approximate)   History   Headache   HPI  Dorothy Rogers is a 72 y.o. female who presents to the ED for evaluation of Headache   I reviewed ED visit from yesterday where patient was evaluated for the same right-sided occipital headache, diagnosed with occipital neuralgia and occipital nerve block was performed with bupivacaine.  CT head without acute features, basic labs unremarkable.  She was referred back to neurology..  Patient presents to the ED for evaluation of intermittent shooting right forehead pain.  Reports intermittent severe pain over the past 4 to 5 days anteriorly to her forehead.  Denies any temporal pain, trauma, occipital pain, fever, neck stiffness, vision changes or weakness to the extremities.  No falls or syncope.  She reports her symptoms had improved when she was here yesterday, but returned over the past 6-8 hours and have not receded since that time.  No rash to the face   Physical Exam   Triage Vital Signs: ED Triage Vitals  Enc Vitals Group     BP 08/08/22 2304 (!) 178/80     Pulse Rate 08/08/22 2304 99     Resp 08/08/22 2304 18     Temp 08/08/22 2304 98.3 F (36.8 C)     Temp Source 08/08/22 2304 Oral     SpO2 08/08/22 2304 97 %     Weight 08/08/22 2305 175 lb 14.8 oz (79.8 kg)     Height 08/08/22 2305 5\' 2"  (1.575 m)     Head Circumference --      Peak Flow --      Pain Score 08/08/22 2305 10     Pain Loc --      Pain Edu? --      Excl. in [place]? --     Most recent vital signs: Vitals:   08/08/22 2304  BP: (!) 178/80  Pulse: 99  Resp: 18  Temp: 98.3 F (36.8 C)  SpO2: 97%    General: Awake, no distress.  CV:  Good peripheral perfusion.  Resp:  Normal effort.  Abd:  No distention.  MSK:  No deformity noted.  Neuro:  No focal deficits appreciated. Cranial nerves II through XII intact 5/5 strength and  sensation in all 4 extremities Other:  Hyperalgesic to even just light touch/stroking of the skin to the right forehead.  No temporal tenderness.   ED Results / Procedures / Treatments   Labs (all labs ordered are listed, but only abnormal results are displayed) Labs Reviewed - No data to display  EKG   RADIOLOGY   Official radiology report(s): No results found.  PROCEDURES and INTERVENTIONS:  Procedures  Medications  carbamazepine (TEGRETOL) chewable tablet 100 mg (100 mg Oral Given 08/09/22 0016)  clonazePAM (KLONOPIN) tablet 0.5 mg (0.5 mg Oral Given 08/09/22 0017)  butalbital-acetaminophen-caffeine (FIORICET) 50-325-40 MG per tablet 2 tablet (2 tablets Oral Given 08/09/22 0017)  gabapentin (NEURONTIN) capsule 100 mg (100 mg Oral Given 08/09/22 0017)  ondansetron (ZOFRAN-ODT) disintegrating tablet 4 mg (4 mg Oral Given 08/09/22 0019)     IMPRESSION / MDM / ASSESSMENT AND PLAN / ED COURSE  I reviewed the triage vital signs and the nursing notes.  Differential diagnosis includes, but is not limited to, trigeminal neuralgia, migraine, occipital neuralgia, temporal arteritis  {Patient presents with symptoms of an acute illness or injury that is  potentially life-threatening.  Patient presents with symptoms more consistent with trigeminal neuralgia than occipital neuralgia.  We will start low doses of carbamazepine and gabapentin and reassess.  No indications for CNS imaging at this point.  As below, resolving symptoms with this regimen we will discharge with the same.  She will call her neurologist later this morning.  We discussed return precautions.  Clinical Course as of 08/09/22 0148  Mon Aug 09, 2022  0144 Reassessed.  Patient reports feeling much better and is appreciative.  She reports lesser frequency of attacks of pain and less severity when they do occur.  We discussed starting carbamazepine and gabapentin and following up with her neurologist.  We discussed return  precautions. [DS]    Clinical Course User Index [DS] Delton Prairie, MD     FINAL CLINICAL IMPRESSION(S) / ED DIAGNOSES   Final diagnoses:  Trigeminal neuralgia     Rx / DC Orders   ED Discharge Orders          Ordered    carbamazepine (CARBATROL) 100 MG 12 hr capsule  2 times daily        08/09/22 0147    gabapentin (NEURONTIN) 100 MG capsule  3 times daily PRN        08/09/22 0147             Note:  This document was prepared using Dragon voice recognition software and may include unintentional dictation errors.   Delton Prairie, MD 08/09/22 [phone]

## 2022-08-08 NOTE — ED Triage Notes (Addendum)
Pt arrived via POV with reports of R side headace, was dx with occipital neuralgia, was given meds in ED yesterday which helped, but pt states the pain started back today around 4pm, pt states the pain is in the same area, does endorse some light sensitivity.  Pt has seen Dr. Brigitte Pulse in the past.  Pt states she gets frequent shooting pain to the right side of her head.  Pt has taken ibuprofen and tylenol today for sxs.

## 2022-08-09 DIAGNOSIS — G500 Trigeminal neuralgia: Secondary | ICD-10-CM | POA: Diagnosis not present

## 2022-08-09 MED ORDER — CARBAMAZEPINE ER 100 MG PO CP12: 100.0000 mg | ORAL_CAPSULE | Freq: Two times a day (BID) | ORAL | 1 refills | Status: DC

## 2022-08-09 MED ORDER — GABAPENTIN 100 MG PO CAPS: 100.0000 mg | ORAL_CAPSULE | Freq: Three times a day (TID) | ORAL | 1 refills | Status: DC | PRN

## 2022-08-09 NOTE — Discharge Instructions (Addendum)
You are being discharged with 2 prescriptions:  Carbamazepine : This would likely be the most helpful medication for the shooting pain to your face.  Take twice daily every day moving forward  Gabapentin : As needed nerve pain medication, take up to 3 times per day  Use Tylenol for pain and fevers.  Up to 1000 mg per dose, up to 4 times per day.  Do not take more than 4000 mg of Tylenol/acetaminophen within 24 hours.Marland Kitchen

## 2022-09-06 ENCOUNTER — Ambulatory Visit: Payer: Medicare Other | Attending: Cardiovascular Disease | Admitting: Cardiovascular Disease

## 2022-09-06 ENCOUNTER — Encounter: Payer: Self-pay | Admitting: Cardiovascular Disease

## 2022-09-06 VITALS — BP 138/70 | HR 102 | Ht 62.5 in | Wt 184.2 lb

## 2022-09-06 DIAGNOSIS — I25118 Atherosclerotic heart disease of native coronary artery with other forms of angina pectoris: Secondary | ICD-10-CM

## 2022-09-06 DIAGNOSIS — E782 Mixed hyperlipidemia: Secondary | ICD-10-CM | POA: Diagnosis present

## 2022-09-06 DIAGNOSIS — I10 Essential (primary) hypertension: Secondary | ICD-10-CM | POA: Diagnosis present

## 2022-09-06 DIAGNOSIS — E118 Type 2 diabetes mellitus with unspecified complications: Secondary | ICD-10-CM | POA: Diagnosis present

## 2022-09-06 DIAGNOSIS — G4733 Obstructive sleep apnea (adult) (pediatric): Secondary | ICD-10-CM | POA: Insufficient documentation

## 2022-09-06 MED ORDER — EZETIMIBE 10 MG PO TABS: 10.0000 mg | ORAL_TABLET | Freq: Every day | ORAL | 3 refills | Status: DC

## 2022-09-06 NOTE — Patient Instructions (Addendum)
Medication Instructions:  Please start zetia 10 mg daily for cholesterol  If you need a refill on your cardiac medications before your next appointment, please call your pharmacy.   Lab work: No new labs needed  Testing/Procedures: No new testing needed  Follow-Up: At [facility], you and your health needs are our priority.  As part of our continuing mission to provide you with exceptional heart care, we have created designated Provider Care Teams.  These Care Teams include your primary Cardiologist (physician) and Advanced Practice Providers (APPs -  Physician Assistants and Nurse Practitioners) who all work together to provide you with the care you need, when you need it.  You will need a follow up appointment in 6 months  Providers on your designated Care Team:   Nicolasa Ducking, NP Eula Listen, PA-C Cadence Fransico [place], [place]

## 2022-09-06 NOTE — Progress Notes (Signed)
Cardiology Office Note  Date:  09/06/2022   ID:  Dorothy Rogers, Dorothy Rogers [birth year], MRN [id number]  PCP:  Marguarite Arbour, MD   Chief Complaint  Patient presents with   New Patient (Initial Visit)    Ref by Dr. Judithann Sheen to establish care for CAD; s/p cardiac cath & stent placements. Patient c/o shortness of breath with little to no exertion. Medications reviewed by the patient verbally.     HPI:  [name] is a 72 year old woman with past medical history of Coronary artery disease, prior stenting 2009 Hypertension hyperlipidemia Diabetes type 2 Who presents by referral from Dr. Judithann Sheen for coronary artery disease Chronic SOB, chronic back pain, nerve stimu8lator Previously seen by [facility] cardiology, last in October 2023  Lab work reviewed A1c 7.7 Total cholesterol 154 LDL 94  Cardiac catheterization performed March 2019, patent stents LAD and RCA Ost LAD to Prox LAD lesion is 40% stenosed. Prox LAD lesion is 30% stenosed. 1st Mrg lesion is 60% stenosed. Prox RCA to Mid RCA lesion is 60% stenosed. Mid RCA to Dist RCA lesion is 30% stenosed. The left ventricular systolic function is normal. LV end diastolic pressure is normal. There is no aortic valve stenosis. There is no mitral valve stenosis and no mitral valve prolapse evident.   Patent stents in lad and rca with no critical disease.   Seen by pulmonary/aleskerov Reduced spirometry with preserved lung volumes and DLCO.  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   Hip pain, Has nerve stimulator for back pain 10/23 Injections in upper back  Can walk if using shopping cart Busy in garden  AiC 7.7 LDL 94  EKG personally reviewed by myself on todays visit Sinus tachycardia rate 102 bpm no significant ST-T wave changes  PMH:   has a past medical history of Anemia, Anemia, Arthritis, Blood transfusion without reported diagnosis, CAD (coronary artery disease) (FEB AND NOV 2009), Cancer, Cataract,  Chronic kidney disease, Coronary artery disease, DDD (degenerative disc disease), lumbar, Diabetes mellitus without complication, Herpes simplex, Hyperlipidemia, Hypertension, Intractable nausea and vomiting (06/09/2015), Vertigo, Vertigo, and Vitamin B 12 deficiency.  PSH:    Past Surgical History:  Procedure Laterality Date   80 HOUR PH STUDY N/A 02/04/2016   Procedure: 24 HOUR PH STUDY;  Surgeon: Midge Minium, MD;  Location: [facility];  Service: Endoscopy;  Laterality: N/A;   ABDOMINAL HYSTERECTOMY     BACK SURGERY     Lumbar spinal fusion 05/30/2015   CARDIAC CATHETERIZATION  2009   CATARACT EXTRACTION Left    CATARACT EXTRACTION W/PHACO Right 03/19/2015   Procedure: CATARACT EXTRACTION PHACO AND INTRAOCULAR LENS PLACEMENT (IOC);  Surgeon: Lockie Mola, MD;  Location: [facility];  Service: Ophthalmology;  Laterality: Right;  DIABETIC - insulin and oral meds   CHOLECYSTECTOMY N/A 05/19/2016   Procedure: LAPAROSCOPIC CHOLECYSTECTOMY;  Surgeon: Ricarda Frame, MD;  Location: [facility] ORS;  Service: General;  Laterality: N/A;   CORONARY STENT PLACEMENT  03/2008   CYSTECTOMY Right    wrist   ESOPHAGEAL MANOMETRY N/A 02/04/2016   Procedure: ESOPHAGEAL MANOMETRY (EM);  Surgeon: Midge Minium, MD;  Location: [facility];  Service: Endoscopy;  Laterality: N/A;   ESOPHAGOGASTRODUODENOSCOPY (EGD) WITH PROPOFOL N/A 09/04/2015   Procedure: ESOPHAGOGASTRODUODENOSCOPY (EGD) WITH PROPOFOL;  Surgeon: Midge Minium, MD;  Location: [facility];  Service: Endoscopy;  Laterality: N/A;  INSULIN DEPENDENT DIABETIC   ESOPHAGOGASTRODUODENOSCOPY (EGD) WITH PROPOFOL N/A 06/29/2016   Procedure: ESOPHAGOGASTRODUODENOSCOPY (EGD) WITH PROPOFOL;  Surgeon: Wyline Mood, MD;  Location:  [facility];  Service: Endoscopy;  Laterality: N/A;   ESOPHAGOGASTRODUODENOSCOPY (EGD) WITH PROPOFOL N/A 01/25/2019   Procedure: ESOPHAGOGASTRODUODENOSCOPY (EGD) WITH PROPOFOL;  Surgeon: Wyline MoodAnna, Kiran, MD;  [facility];  Service: Gastroenterology;  Laterality: N/A;   EYE SURGERY Left    cataract extraction   HIATAL HERNIA REPAIR  02/2016   Dr Tonita CongWoodham   LEFT HEART CATH AND CORONARY ANGIOGRAPHY Left 08/18/2017   Procedure: LEFT HEART CATH AND CORONARY ANGIOGRAPHY;  Surgeon: Dalia HeadingFath, Kenneth A, MD;  Location: [facility] INVASIVE CV LAB;  Service: Cardiovascular;  Laterality: Left;   LUMBAR FUSION  2001, 2014, 2015   RIGHT HEART CATH N/A 05/10/2019   Procedure: RIGHT HEART CATH;  Surgeon: Dalia HeadingFath, Kenneth A, MD;  Location: [facility] INVASIVE CV LAB;  Service: Cardiovascular;  Laterality: N/A;   SPINE SURGERY     2001, 2014, 2015   TUMOR EXCISION     back of neck    Current Outpatient Medications  Medication Sig Dispense Refill   acetaminophen (TYLENOL) 500 MG tablet Take 1,000 mg by mouth every 6 (six) hours as needed for mild pain or moderate pain.      acyclovir (ZOVIRAX) 400 MG tablet Take 400 mg by mouth daily as needed (for fever blisters).      amitriptyline (ELAVIL) 10 MG tablet Take 10 mg by mouth at bedtime.     amLODipine (NORVASC) 5 MG tablet Take 5 mg by mouth daily.     aspirin EC 81 MG EC tablet Take 1 tablet (81 mg total) by mouth daily. (Patient taking differently: Take 81 mg by mouth at bedtime.)     atorvastatin (LIPITOR) 40 MG tablet Take 1 tablet by mouth once daily     Biotin 10 MG TABS Take by mouth.     Calcium Carb-Cholecalciferol (CALTRATE 600+D3 PO) Take 1 tablet by mouth daily.     Calcium Carbonate-Vitamin D 600-400 MG-UNIT tablet Take by mouth.     carbamazepine (CARBATROL) 100 MG 12 hr capsule Take 1 capsule (100 mg total) by mouth 2 (two) times daily. (Patient taking differently: Take 200 mg by mouth 2 (two) times daily.) 60 capsule 1   CARTIA XT 120 MG 24 hr capsule Take 120 mg by mouth daily.  3   Cholecalciferol (VITAMIN D-3) 125 MCG (5000 UT) TABS Take 5,000 Units by mouth daily.     clopidogrel (PLAVIX) 75 MG tablet Take 1 tablet by mouth once daily     conjugated estrogens  (PREMARIN) vaginal cream Place 0.5 g vaginally 2 (two) times a week.     Cyanocobalamin (VITAMIN B-12) 5000 MCG SUBL Place 5,000 mcg under the tongue daily.     diphenoxylate-atropine (LOMOTIL) 2.5-0.025 MG tablet Take 1 tablet by mouth 4 (four) times daily as needed for diarrhea or loose stools. 20 tablet 0   ferrous sulfate 325 (65 FE) MG tablet Take 325 mg by mouth daily with breakfast.     glipiZIDE (GLUCOTROL) 10 MG tablet Take 10 mg by mouth 2 (two) times daily. Before breakfast & before supper     ibuprofen (ADVIL) 200 MG tablet Take 400 mg by mouth every 8 (eight) hours as needed (for pain.).     insulin glargine (LANTUS SOLOSTAR) 100 UNIT/ML Solostar Pen INJECT 36 TO 50 UNITS SUBCUTANEOUSLY ONCE DAILY AS DIRECTED     insulin lispro (HUMALOG) 100 UNIT/ML injection Inject into the skin 3 (three) times daily before meals. Sliding scale     loperamide (IMODIUM) 2 MG capsule Take by mouth.  LORazepam (ATIVAN) 1 MG tablet Take 1 tablet by mouth every 8 (eight) hours as needed.     losartan (COZAAR) 100 MG tablet Take 1 tablet by mouth once daily     meclizine (ANTIVERT) 25 MG tablet Take 25 mg by mouth 3 (three) times daily.     ondansetron (ZOFRAN-ODT) 8 MG disintegrating tablet Take 8 mg by mouth as needed for nausea or vomiting.     pantoprazole (PROTONIX) 20 MG tablet Take by mouth.     Probiotic CAPS Take 1 capsule by mouth daily.     promethazine (PHENERGAN) 12.5 MG tablet Take 1 tablet (12.5 mg total) by mouth every 6 (six) hours as needed for nausea or vomiting. 15 tablet 0   diazepam (VALIUM) 2 MG tablet Take by mouth. (Patient not taking: Reported on 09/06/2022)     gabapentin (NEURONTIN) 100 MG capsule Take 1 capsule (100 mg total) by mouth 3 (three) times daily as needed (facial pain). (Patient not taking: Reported on 09/06/2022) 30 capsule 1   No current facility-administered medications for this visit.   Facility-Administered Medications Ordered in Other Visits  Medication Dose  Route Frequency Provider Last Rate Last Admin   sodium chloride flush (NS) 0.9 % injection 3 mL  3 mL Intravenous Q12H Dalia Heading, MD         Allergies:   Sildenafil, Tramadol, Beta adrenergic blockers, Celecoxib, Duloxetine, Gabapentin, Heparin, Hydrocodone-acetaminophen, Indomethacin, Niacin and related, Shellfish allergy, Vicodin [hydrocodone-acetaminophen], Carbamazepine, and Oxycodone   Social History:  The patient  reports that she has never smoked. She has never used smokeless tobacco. She reports current alcohol use. She reports that she does not use drugs.   Family History:   family history includes Diabetes in an other family member; Heart attack (age of onset: 70) in her father; Heart disease in her father.    Review of Systems: Review of Systems  Constitutional: Negative.   HENT: Negative.    Respiratory: Negative.    Cardiovascular: Negative.   Gastrointestinal: Negative.   Musculoskeletal:  Positive for back pain.  Neurological: Negative.   Psychiatric/Behavioral: Negative.    All other systems reviewed and are negative.    PHYSICAL EXAM: VS:  BP 138/70 (BP Location: Right Arm, Patient Position: Sitting, Cuff Size: Normal)   Pulse (!) 102   Ht 5' 2.5" (1.588 m)   Wt 184 lb 4 oz (83.6 kg)   SpO2 98%   BMI 33.16 kg/m  , BMI Body mass index is 33.16 kg/m. GEN: Well nourished, well developed, in no acute distress HEENT: normal Neck: no JVD, carotid bruits, or masses Cardiac: RRR; no murmurs, rubs, or gallops,no edema  Respiratory:  clear to auscultation bilaterally, normal work of breathing GI: soft, nontender, nondistended, + BS MS: no deformity or atrophy Skin: warm and dry, no rash Neuro:  Strength and sensation are intact Psych: euthymic mood, full affect  Recent Labs: 02/04/2022: ALT 81 08/07/2022: BUN 35; Creatinine, Ser 1.62; Hemoglobin 11.5; Platelets 197; Potassium 4.0; Sodium 133    Lipid Panel No results found for: "CHOL", "HDL", "LDLCALC",  "TRIG"    Wt Readings from Last 3 Encounters:  09/06/22 184 lb 4 oz (83.6 kg)  08/08/22 175 lb 14.8 oz (79.8 kg)  08/07/22 175 lb 14.8 oz (79.8 kg)       ASSESSMENT AND PLAN:  Problem List Items Addressed This Visit       Cardiology Problems   Hyperlipemia   Relevant Medications   amLODipine (NORVASC) 5 MG  tablet   Hypertension   Relevant Medications   amLODipine (NORVASC) 5 MG tablet   CAD (coronary artery disease) - Primary   Relevant Medications   amLODipine (NORVASC) 5 MG tablet     Other   OSA (obstructive sleep apnea)   Other Visit Diagnoses     Diabetes mellitus type 2 with complications          Coronary disease with stable angina Prior stenting 2009 Denies anginal symptoms Stressed importance of aggressive diabetes control To achieve optimal lipid control we will add Zetia 10 mg daily with her statin No additional testing at this time  Hyperlipidemia Recommend she continue Lipitor 40 daily, add Zetia 10 to achieve goal LDL less than 70 preferably 60  Essential hypertension Blood pressure is well controlled on today's visit. No changes made to the medications.  Chronic back pain Spinal cord stimulator in place, still with residual symptoms  Diabetes type 2 with complications Stressed importance of achieving A1c in the 6 range Unable to exercise secondary to chronic back pain Calorie restriction recommended    Total encounter time more than 60 minutes  Greater than 50% was spent in counseling and coordination of care with the patient    Signed, Dossie Arbour, M.D., Ph.D. [facility] [place], [place] [phone]

## 2023-01-07 ENCOUNTER — Other Ambulatory Visit: Payer: Self-pay | Admitting: Pulmonary Disease

## 2023-01-07 DIAGNOSIS — R0602 Shortness of breath: Secondary | ICD-10-CM

## 2023-01-07 DIAGNOSIS — J986 Disorders of diaphragm: Secondary | ICD-10-CM

## 2023-01-10 ENCOUNTER — Ambulatory Visit
Admission: RE | Admit: 2023-01-10 | Discharge: 2023-01-10 | Disposition: A | Discharge status: Home / Self Care | Payer: Medicare Other | Source: Ambulatory Visit | Attending: Pulmonary Disease | Admitting: Pulmonary Disease

## 2023-01-10 DIAGNOSIS — R0602 Shortness of breath: Secondary | ICD-10-CM | POA: Diagnosis not present

## 2023-01-10 DIAGNOSIS — J986 Disorders of diaphragm: Secondary | ICD-10-CM | POA: Insufficient documentation

## 2023-01-25 ENCOUNTER — Other Ambulatory Visit: Payer: Self-pay | Admitting: Internal Medicine

## 2023-01-25 DIAGNOSIS — Z1231 Encounter for screening mammogram for malignant neoplasm of breast: Secondary | ICD-10-CM

## 2023-03-14 ENCOUNTER — Ambulatory Visit
Admission: RE | Admit: 2023-03-14 | Discharge: 2023-03-14 | Disposition: A | Discharge status: Home / Self Care | Payer: Medicare Other | Source: Ambulatory Visit | Attending: Internal Medicine | Admitting: Internal Medicine

## 2023-03-14 DIAGNOSIS — Z1231 Encounter for screening mammogram for malignant neoplasm of breast: Secondary | ICD-10-CM | POA: Diagnosis present

## 2023-03-20 NOTE — Progress Notes (Unsigned)
Cardiology Office Note  Date:  03/21/2023   ID:  Dorothy Rogers, [name] Dorothy Rogers, 1952, MRN [id number]  PCP:  Dorothy Arbour, MD   Chief Complaint  Patient presents with   6 month follow up     "Doing well." Medications reviewed by the patient verbally.     HPI:  [name] is a 72 year old woman with past medical history of Coronary artery disease, prior stenting 2009 Hypertension  hyperlipidemia Diabetes type 2 Chronic SOB, paralyzed hemidiaphragm chronic back pain, nerve stimulator Previously seen by [facility] cardiology, last in October 2023 Who presents for follow-up of her coronary artery disease and short of breath  Last seen by myself in clinic April 2024 In follow-up today reports feeling relatively well Chronic shortness of breath which she attributes to paralyzed hemidiaphragm Followed by pulmonary at [facility]  Denies significant chest pain concerning for angina Breathing is stable as above Denies significant leg swelling  A1c higher after being treated with prednisone, hip pain Active, working in the garden  Lab work reviewed A1c 7.7 up to 8.5, on prednisone Total cholesterol 146 LDL 84  EKG personally reviewed by myself on todays visit EKG Interpretation Date/Time:  Monday March 21 2023 15:25:48 EDT Ventricular Rate:  98 PR Interval:  138 QRS Duration:  78 QT Interval:  346 QTC Calculation: 441 R Axis:   20  Text Interpretation: Normal sinus rhythm Normal ECG When compared with ECG of 12-May-2020 14:59, No significant change was found Confirmed by Dorothy Rogers ([id number]) on 03/21/2023 3:37:08 PM    Cardiac catheterization performed March 2019, patent stents LAD and RCA Ost LAD to Prox LAD lesion is 40% stenosed. Prox LAD lesion is 30% stenosed. 1st Mrg lesion is 60% stenosed. Prox RCA to Mid RCA lesion is 60% stenosed. Mid RCA to Dist RCA lesion is 30% stenosed. The left ventricular systolic function is normal. LV end diastolic pressure is  normal. There is no aortic valve stenosis. There is no mitral valve stenosis and no mitral valve prolapse evident.   Patent stents in lad and rca with no critical disease.    PMH:   has a past medical history of Anemia, Anemia, Arthritis, Blood transfusion without reported diagnosis, CAD (coronary artery disease) (FEB AND NOV 2009), Cancer (HCC), Cataract, Chronic kidney disease, Coronary artery disease, DDD (degenerative disc disease), lumbar, Diabetes mellitus without complication (HCC), Herpes simplex, Hyperlipidemia, Hypertension, Intractable nausea and vomiting (06/09/2015), Vertigo, Vertigo, and Vitamin B 12 deficiency.  PSH:    Past Surgical History:  Procedure Laterality Date   59 HOUR PH STUDY N/A 02/04/2016   Procedure: 24 HOUR PH STUDY;  Surgeon: Dorothy Minium, MD;  Location: [facility];  Service: Endoscopy;  Laterality: N/A;   ABDOMINAL HYSTERECTOMY     BACK SURGERY     Lumbar spinal fusion 05/30/2015   CARDIAC CATHETERIZATION  2009   CATARACT EXTRACTION Left    CATARACT EXTRACTION W/PHACO Right 03/19/2015   Procedure: CATARACT EXTRACTION PHACO AND INTRAOCULAR LENS PLACEMENT (IOC);  Surgeon: Dorothy Mola, MD;  Location: [facility];  Service: Ophthalmology;  Laterality: Right;  DIABETIC - insulin and oral meds   CHOLECYSTECTOMY N/A 05/19/2016   Procedure: LAPAROSCOPIC CHOLECYSTECTOMY;  Surgeon: Dorothy Frame, MD;  Location: [facility] ORS;  Service: General;  Laterality: N/A;   CORONARY STENT PLACEMENT  03/2008   CYSTECTOMY Right    wrist   ESOPHAGEAL MANOMETRY N/A 02/04/2016   Procedure: ESOPHAGEAL MANOMETRY (EM);  Surgeon: Dorothy Minium, MD;  Location: [facility];  Service: Endoscopy;  Laterality: N/A;   ESOPHAGOGASTRODUODENOSCOPY (EGD) WITH PROPOFOL N/A 09/04/2015   Procedure: ESOPHAGOGASTRODUODENOSCOPY (EGD) WITH PROPOFOL;  Surgeon: Dorothy Minium, MD;  Location: [facility];  Service: Endoscopy;  Laterality: N/A;  INSULIN DEPENDENT DIABETIC    ESOPHAGOGASTRODUODENOSCOPY (EGD) WITH PROPOFOL N/A 06/29/2016   Procedure: ESOPHAGOGASTRODUODENOSCOPY (EGD) WITH PROPOFOL;  Surgeon: Dorothy Mood, MD;  Location: [facility];  Service: Endoscopy;  Laterality: N/A;   ESOPHAGOGASTRODUODENOSCOPY (EGD) WITH PROPOFOL N/A 01/25/2019   Procedure: ESOPHAGOGASTRODUODENOSCOPY (EGD) WITH PROPOFOL;  Surgeon: Dorothy Mood, MD;  Location: [facility];  Service: Gastroenterology;  Laterality: N/A;   EYE SURGERY Left    cataract extraction   HIATAL HERNIA REPAIR  02/2016   Dr Dorothy Rogers   LEFT HEART CATH AND CORONARY ANGIOGRAPHY Left 08/18/2017   Procedure: LEFT HEART CATH AND CORONARY ANGIOGRAPHY;  Surgeon: Dorothy Heading, MD;  Location: [facility];  Service: Cardiovascular;  Laterality: Left;   LUMBAR FUSION  2001, 2014, 2015   RIGHT HEART CATH N/A 05/10/2019   Procedure: RIGHT HEART CATH;  Surgeon: Dorothy Heading, MD;  Location: [facility];  Service: Cardiovascular;  Laterality: N/A;   SPINE SURGERY     2001, 2014, 2015   TUMOR EXCISION     back of neck    Current Outpatient Medications  Medication Sig Dispense Refill   acetaminophen (TYLENOL) 500 MG tablet Take 1,000 mg by mouth every 6 (six) hours as needed for mild pain or moderate pain.      acyclovir (ZOVIRAX) 400 MG tablet Take 400 mg by mouth daily as needed (for fever blisters).      amLODipine (NORVASC) 5 MG tablet Take 5 mg by mouth daily.     aspirin EC 81 MG EC tablet Take 1 tablet (81 mg total) by mouth daily. (Patient taking differently: Take 81 mg by mouth at bedtime.)     atorvastatin (LIPITOR) 40 MG tablet Take 1 tablet by mouth once daily     Biotin 10 MG TABS Take by mouth.     Calcium Carbonate-Vitamin D 600-400 MG-UNIT tablet Take by mouth.     CARTIA XT 120 MG 24 hr capsule Take 120 mg by mouth daily.  3   Cholecalciferol (VITAMIN D-3) 125 MCG (5000 UT) TABS Take 5,000 Units by mouth daily.     clopidogrel (PLAVIX) 75 MG tablet Take 1 tablet by mouth once  daily     ezetimibe (ZETIA) 10 MG tablet Take 1 tablet (10 mg total) by mouth daily. 90 tablet 3   ferrous sulfate 325 (65 FE) MG tablet Take 325 mg by mouth daily with breakfast.     glipiZIDE (GLUCOTROL) 10 MG tablet Take 10 mg by mouth 2 (two) times daily. Before breakfast & before supper     ibuprofen (ADVIL) 200 MG tablet Take 400 mg by mouth every 8 (eight) hours as needed (for pain.).     insulin glargine (LANTUS SOLOSTAR) 100 UNIT/ML Solostar Pen INJECT 36 TO 50 UNITS SUBCUTANEOUSLY ONCE DAILY AS DIRECTED     insulin lispro (HUMALOG) 100 UNIT/ML KwikPen INJECT 0-15u UNDER THE SKIN THREE TIMES DAILY AS NEEDED     LORazepam (ATIVAN) 1 MG tablet Take 1 tablet by mouth every 8 (eight) hours as needed.     losartan (COZAAR) 100 MG tablet Take 1 tablet by mouth once daily     ondansetron (ZOFRAN-ODT) 8 MG disintegrating tablet Take 8 mg by mouth as needed for nausea or vomiting.     pantoprazole (PROTONIX) 20 MG  tablet Take by mouth.     Probiotic CAPS Take 1 capsule by mouth daily.     amitriptyline (ELAVIL) 10 MG tablet Take 10 mg by mouth at bedtime. (Patient not taking: Reported on 03/21/2023)     carbamazepine (CARBATROL) 100 MG 12 hr capsule Take 1 capsule (100 mg total) by mouth 2 (two) times daily. (Patient not taking: Reported on 03/21/2023) 60 capsule 1   conjugated estrogens (PREMARIN) vaginal cream Place 0.5 g vaginally 2 (two) times a week. (Patient not taking: Reported on 03/21/2023)     diazepam (VALIUM) 2 MG tablet Take by mouth. (Patient not taking: Reported on 09/06/2022)     gabapentin (NEURONTIN) 100 MG capsule Take 1 capsule (100 mg total) by mouth 3 (three) times daily as needed (facial pain). (Patient not taking: Reported on 09/06/2022) 30 capsule 1   loperamide (IMODIUM) 2 MG capsule Take by mouth. (Patient not taking: Reported on 03/21/2023)     meclizine (ANTIVERT) 25 MG tablet Take 25 mg by mouth 3 (three) times daily. (Patient not taking: Reported on 03/21/2023)      promethazine (PHENERGAN) 12.5 MG tablet Take 1 tablet (12.5 mg total) by mouth every 6 (six) hours as needed for nausea or vomiting. (Patient not taking: Reported on 03/21/2023) 15 tablet 0   No current facility-administered medications for this visit.   Facility-Administered Medications Ordered in Other Visits  Medication Dose Route Frequency Provider Last Rate Last Admin   sodium chloride flush (NS) 0.9 % injection 3 mL  3 mL Intravenous Q12H Dorothy Heading, MD        Allergies:   Sildenafil, Tramadol, Beta adrenergic blockers, Celecoxib, Duloxetine, Gabapentin, Heparin, Hydrocodone-acetaminophen, Indomethacin, Niacin and related, Shellfish allergy, Vicodin [hydrocodone-acetaminophen], Carbamazepine, and Oxycodone   Social History:  The patient  reports that she has never smoked. She has never used smokeless tobacco. She reports current alcohol use. She reports that she does not use drugs.   Family History:   family history includes Diabetes in an other family member; Heart attack (age of onset: 57) in her father; Heart disease in her father.    Review of Systems: Review of Systems  Constitutional: Negative.   HENT: Negative.    Respiratory:  Positive for shortness of breath.   Cardiovascular: Negative.   Gastrointestinal: Negative.   Musculoskeletal:  Positive for back pain.  Neurological: Negative.   Psychiatric/Behavioral: Negative.    All other systems reviewed and are negative.   PHYSICAL EXAM: VS:  BP 128/62 (BP Location: Left Arm, Patient Position: Sitting, Cuff Size: Normal)   Pulse 98   Ht 5' 2.5" (1.588 m)   Wt 175 lb (79.4 kg)   SpO2 98%   BMI 31.50 kg/m  , BMI Body mass index is 31.5 kg/m. GEN: Well nourished, well developed, in no acute distress HEENT: normal Neck: no JVD, carotid bruits, or masses Cardiac: RRR; no murmurs, rubs, or gallops,no edema  Respiratory:  clear to auscultation bilaterally, normal work of breathing GI: soft, nontender, nondistended,  + BS MS: no deformity or atrophy Skin: warm and dry, no rash Neuro:  Strength and sensation are intact Psych: euthymic Rogers, full affect  Recent Labs: 08/07/2022: BUN 35; Creatinine, Ser 1.62; Hemoglobin 11.5; Platelets 197; Potassium 4.0; Sodium 133    Lipid Panel No results found for: "CHOL", "HDL", "LDLCALC", "TRIG"    Wt Readings from Last 3 Encounters:  03/21/23 175 lb (79.4 kg)  Rogers/08/24 184 lb 4 oz (83.6 kg)  08/08/22 175 lb 14.8 oz (  79.8 kg)     ASSESSMENT AND PLAN:  Problem List Items Addressed This Visit       Cardiology Problems   Hyperlipemia   Hypertension   Relevant Orders   EKG 12-Lead (Completed)   Supraventricular tachycardia (HCC)   CAD (coronary artery disease) - Primary   Relevant Orders   EKG 12-Lead (Completed)   Peripheral vascular disease (HCC)   Relevant Orders   EKG 12-Lead (Completed)     Other   OSA (obstructive sleep apnea)   Other Visit Diagnoses     Diabetes mellitus type 2 with complications (HCC)       Relevant Medications   insulin lispro (HUMALOG) 100 UNIT/ML KwikPen       Coronary disease with stable angina Prior stenting 2009 Repeat catheter 2019, no intervention, medical management recommended Tolerating Lipitor with Zetia, goal LDL less than 70 Discussed elevated A1c after recent prednisone  Hyperlipidemia Recommend she continue Lipitor with Zetia  Essential hypertension Blood pressure is well controlled on today's visit. No changes made to the medications.  Chronic back pain Spinal cord stimulator in place, still with residual symptoms  Diabetes type 2 with complications Stressed importance of achieving A1c in the 6 range Unable to exercise secondary to chronic back pain Calorie restriction recommended  Chronic shortness of breath Likely multifactorial, followed by pulmonary Paralyzed hemidiaphragm, reports that she sent her information to a specialist in [place] but was not a candidate for surgical  ntervention    Signed, Dossie Rogers, M.D., Ph.D. [facility] [place], [place] [phone]

## 2023-03-21 ENCOUNTER — Encounter: Payer: Self-pay | Admitting: Cardiovascular Disease

## 2023-03-21 ENCOUNTER — Ambulatory Visit: Payer: Medicare Other | Attending: Cardiovascular Disease | Admitting: Cardiovascular Disease

## 2023-03-21 VITALS — BP 128/62 | HR 98 | Ht 62.5 in | Wt 175.0 lb

## 2023-03-21 DIAGNOSIS — I10 Essential (primary) hypertension: Secondary | ICD-10-CM | POA: Insufficient documentation

## 2023-03-21 DIAGNOSIS — I25118 Atherosclerotic heart disease of native coronary artery with other forms of angina pectoris: Secondary | ICD-10-CM | POA: Insufficient documentation

## 2023-03-21 DIAGNOSIS — I739 Peripheral vascular disease, unspecified: Secondary | ICD-10-CM | POA: Insufficient documentation

## 2023-03-21 DIAGNOSIS — E118 Type 2 diabetes mellitus with unspecified complications: Secondary | ICD-10-CM | POA: Diagnosis present

## 2023-03-21 DIAGNOSIS — G4733 Obstructive sleep apnea (adult) (pediatric): Secondary | ICD-10-CM | POA: Insufficient documentation

## 2023-03-21 DIAGNOSIS — I471 Supraventricular tachycardia, unspecified: Secondary | ICD-10-CM | POA: Diagnosis present

## 2023-03-21 DIAGNOSIS — E782 Mixed hyperlipidemia: Secondary | ICD-10-CM | POA: Insufficient documentation

## 2023-03-21 NOTE — Patient Instructions (Signed)

## 2023-05-23 ENCOUNTER — Ambulatory Visit
Admission: EM | Admit: 2023-05-23 | Discharge: 2023-05-23 | Disposition: A | Discharge status: Home / Self Care | Payer: Medicare Other | Attending: Emergency Medicine | Admitting: Emergency Medicine

## 2023-05-23 DIAGNOSIS — M62838 Other muscle spasm: Secondary | ICD-10-CM | POA: Diagnosis not present

## 2023-05-23 MED ORDER — BACLOFEN 5 MG PO TABS: 5.0000 mg | ORAL_TABLET | Freq: Three times a day (TID) | ORAL | 0 refills | Status: DC | PRN

## 2023-05-23 MED ORDER — METHYLPREDNISOLONE 4 MG PO TBPK: ORAL_TABLET | ORAL | 0 refills | Status: DC

## 2023-05-23 NOTE — ED Triage Notes (Signed)
Neck pain that comes down into her shoulders since Sat morning.

## 2023-05-23 NOTE — ED Provider Notes (Signed)
[facility]    CSN: [id number] Arrival date & time: 05/23/23  1743      History   Chief Complaint Chief Complaint  Patient presents with   Torticollis    HPI Dorothy Rogers is a 72 y.o. female.   HPI  72 year old female with a past medical history significant for type 2 diabetes, CKD stage III, CAD, multilevel degenerative disc disease, cervical radiculopathy, and hypertension presents for evaluation of 2 days worth of neck pain that radiates into her shoulders.  She reports that the day before her symptoms started she was visiting her in-laws in [place] and sat on the couch with her head turned to the left for a prolonged period of time talking to her sister-in-law.  She is unsure if this is what prompted the neck pain or not.  She reports the next day she woke up with the pain.  She denies any numbness or tingling in her extremities.  Past Medical History:  Diagnosis Date   Anemia    Anemia    Arthritis    lower back, hips   Blood transfusion without reported diagnosis    CAD (coronary artery disease) FEB AND NOV 2009   6 STENTS   Cancer [facility])    tumor back of neck-fibroushistocytoma   Cataract    Chronic kidney disease    Stage III   Coronary artery disease    DDD (degenerative disc disease), lumbar    Diabetes mellitus without complication (HCC)    TYPE 2   Herpes simplex    Hyperlipidemia    Hypertension    CONTROLLED ON MEDS   Intractable nausea and vomiting 06/09/2015   Vertigo    occasional, no episodes in 2-3 months   Vertigo    OCCASIONALLY   Vitamin B 12 deficiency     Patient Active Problem List   Diagnosis Date Noted   Asthma 07/31/2019   Esophageal dysphagia    Chronic left hip pain 05/17/2018   Elevated erythrocyte sedimentation rate 05/17/2018   OSA (obstructive sleep apnea) 12/07/2017   Bilateral sciatica 11/29/2016   Gastroparesis due to DM (HCC) 11/26/2016   Cholelithiasis    Protein-calorie malnutrition, severe  05/18/2016   Supraventricular tachycardia (HCC) 05/05/2016   History of paroxysmal supraventricular tachycardia 05/05/2016   SOB (shortness of breath)    Near syncope 04/07/2016   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/30/2015   Presence of stent in coronary artery 05/30/2015   Sacroiliac joint dysfunction 02/10/2015   Status post lumbar spinal fusion 12/25/2014   DDD (degenerative disc disease), lumbar 11/18/2014   Lumbar post-laminectomy syndrome 11/18/2014   Facet syndrome, lumbar 11/18/2014   Piriformis syndrome of left side 11/18/2014   Status post lumbar spine operation 05/09/2014   Adiposity 04/23/2014   Thoracic and lumbosacral neuritis 03/27/2013   H/O arthrodesis 03/27/2013  Peripheral vascular disease (HCC) 03/22/2013   Hypomagnesemia 03/22/2013   Absolute anemia 03/22/2013    Past Surgical History:  Procedure Laterality Date   49 HOUR PH STUDY N/A 02/04/2016   Procedure: 24 HOUR PH STUDY;  Surgeon: Midge Minium, MD;  Location: [facility] ENDOSCOPY;  Service: Endoscopy;  Laterality: N/A;   ABDOMINAL HYSTERECTOMY     BACK SURGERY     Lumbar spinal fusion 05/30/2015   CARDIAC CATHETERIZATION  2009   CATARACT EXTRACTION Left    CATARACT EXTRACTION W/PHACO Right 03/19/2015   Procedure: CATARACT EXTRACTION PHACO AND  INTRAOCULAR LENS PLACEMENT (IOC);  Surgeon: Lockie Mola, MD;  Location: [facility];  Service: Ophthalmology;  Laterality: Right;  DIABETIC - insulin and oral meds   CHOLECYSTECTOMY N/A 05/19/2016   Procedure: LAPAROSCOPIC CHOLECYSTECTOMY;  Surgeon: Ricarda Frame, MD;  Location: [facility] ORS;  Service: General;  Laterality: N/A;   CORONARY STENT PLACEMENT  03/2008   CYSTECTOMY Right    wrist   ESOPHAGEAL MANOMETRY N/A 02/04/2016   Procedure: ESOPHAGEAL MANOMETRY (EM);  Surgeon: Midge Minium, MD;  Location: [facility] END[facility];  Service: Endoscopy;  Laterality: N/A;   ESOPHAGOGASTRODUODENOSCOPY (EGD) WITH PROPOFOL N/A 09/04/2015   Procedure: ESOPHAGOGASTRODUODENOSCOPY (EGD) WITH PROPOFOL;  Surgeon: Midge Minium, MD;  Location: [facility];  Service: Endoscopy;  Laterality: N/A;  INSULIN DEPENDENT DIABETIC   ESOPHAGOGASTRODUODENOSCOPY (EGD) WITH PROPOFOL N/A 06/29/2016   Procedure: ESOPHAGOGASTRODUODENOSCOPY (EGD) WITH PROPOFOL;  Surgeon: Wyline Mood, MD;  Location: [facility] END[facility];  Service: Endoscopy;  Laterality: N/A;   ESOPHAGOGASTRODUODENOSCOPY (EGD) WITH PROPOFOL N/A 01/25/2019   Procedure: ESOPHAGOGASTRODUODENOSCOPY (EGD) WITH PROPOFOL;  Surgeon: Wyline Mood, MD;  Location: [facility] ENDOSCOPY;  Service: Gastroenterology;  Laterality: N/A;   EYE SURGERY Left    cataract extraction   HIATAL HERNIA REPAIR  02/2016   Dr Tonita Cong   LEFT HEART CATH AND CORONARY ANGIOGRAPHY Left 08/18/2017   Procedure: LEFT HEART CATH AND CORONARY ANGIOGRAPHY;  Surgeon: Dalia Heading, MD;  Location: [facility];  Service: Cardiovascular;  Laterality: Left;   LUMBAR FUSION  2001, 2014, 2015   RIGHT HEART CATH N/A 05/10/2019   Procedure: RIGHT HEART CATH;  Surgeon: Dalia Heading, MD;  Location: [facility];  Service: Cardiovascular;  Laterality: N/A;   SPINE SURGERY     2001, 2014, 2015   TUMOR EXCISION     back of neck    OB History   No obstetric history on file.      Home  Medications    Prior to Admission medications   Medication Sig Start Date End Date Taking? Authorizing Provider  acyclovir (ZOVIRAX) 400 MG tablet Take 400 mg by mouth daily as needed (for fever blisters).  08/07/15  Yes [provider]  amLODipine (NORVASC) 5 MG tablet Take 5 mg by mouth daily.   Yes [provider]  aspirin EC 81 MG EC tablet Take 1 tablet (81 mg total) by mouth daily. Patient taking differently: Take 81 mg by mouth at bedtime. 05/21/16  Yes Gouru, Deanna Artis, MD  atorvastatin (LIPITOR) 40 MG tablet Take 1 tablet by mouth once daily 08/11/19  Yes [provider]  Baclofen 5 MG TABS Take 1 tablet (5 mg total) by mouth 3 (three) times daily as needed. 05/23/23  Yes Becky Augusta, NP  Biotin 10 MG TABS Take by mouth.   Yes [provider]  Calcium Carbonate-Vitamin D 600-400 MG-UNIT tablet Take by mouth.   Yes [provider]  Cholecalciferol (VITAMIN D-3) 125 MCG (5000  UT) TABS Take 5,000 Units by mouth daily.   Yes [provider]  clopidogrel (PLAVIX) 75 MG tablet Take 1 tablet by mouth once daily 09/30/19  Yes [provider]  diltiazem (CARDIZEM CD) 180 MG 24 hr capsule Take 180 mg by mouth daily. 12/13/17  Yes [provider]  ezetimibe (ZETIA) 10 MG tablet Take 1 tablet (10 mg total) by mouth daily. 09/06/22  Yes Gollan, Tollie Pizza, MD  ferrous sulfate 325 (65 FE) MG tablet Take 325 mg by mouth daily with breakfast.   Yes [provider]  glipiZIDE (GLUCOTROL) 10 MG tablet Take 10 mg by mouth 2 (two) times daily. Before breakfast & before supper   Yes [provider]  ibuprofen (ADVIL) 200 MG tablet Take 400 mg by mouth every 8 (eight) hours as needed (for pain.).   Yes [provider]  insulin glargine (LANTUS SOLOSTAR) 100 UNIT/ML Solostar Pen INJECT 36 TO 50 UNITS SUBCUTANEOUSLY ONCE DAILY AS DIRECTED 08/02/19  Yes [provider]  insulin lispro (HUMALOG) 100 UNIT/ML KwikPen  INJECT 0-15u UNDER THE SKIN THREE TIMES DAILY AS NEEDED 12/10/20  Yes [provider]  loperamide (IMODIUM) 2 MG capsule Take by mouth.   Yes [provider]  LORazepam (ATIVAN) 1 MG tablet Take 1 tablet by mouth every 8 (eight) hours as needed. 08/07/20  Yes [provider]  losartan (COZAAR) 100 MG tablet Take 1 tablet by mouth once daily 08/27/19  Yes [provider]  methylPREDNISolone (MEDROL DOSEPAK) 4 MG TBPK tablet Take according to the package insert. 05/23/23  Yes Becky Augusta, NP  ondansetron (ZOFRAN-ODT) 8 MG disintegrating tablet Take 8 mg by mouth as needed for nausea or vomiting.   Yes [provider]  pantoprazole (PROTONIX) 20 MG tablet Take by mouth. 07/22/20  Yes [provider]  Probiotic CAPS Take 1 capsule by mouth daily.   Yes [provider]  acetaminophen (TYLENOL) 500 MG tablet Take 1,000 mg by mouth every 6 (six) hours as needed for mild pain or moderate pain.     [provider]  amitriptyline (ELAVIL) 10 MG tablet Take 10 mg by mouth at bedtime. Patient not taking: Reported on 03/21/2023    [provider]  carbamazepine (CARBATROL) 100 MG 12 hr capsule Take 1 capsule (100 mg total) by mouth 2 (two) times daily. Patient not taking: Reported on 03/21/2023 08/09/22   Delton Prairie, MD  conjugated estrogens (PREMARIN) vaginal cream Place 0.5 g vaginally 2 (two) times a week. Patient not taking: Reported on 03/21/2023    [provider]  diazepam (VALIUM) 2 MG tablet Take by mouth. Patient not taking: Reported on 09/06/2022 06/05/19   [provider]  gabapentin (NEURONTIN) 100 MG capsule Take 1 capsule (100 mg total) by mouth 3 (three) times daily as needed (facial pain). Patient not taking: Reported on 09/06/2022 08/09/22 08/09/23  Delton Prairie, MD  meclizine (ANTIVERT) 25 MG tablet Take 25 mg by mouth 3 (three) times daily. Patient not taking: Reported on 03/21/2023 06/03/22   [provider]  promethazine (PHENERGAN) 12.5 MG tablet Take 1 tablet (12.5 mg total) by mouth every 6 (six) hours as needed for nausea or vomiting. Patient not taking: Reported on 03/21/2023 05/13/20   Chesley Noon, MD  modafinil (PROVIGIL) 100 MG tablet Take by mouth. 10/09/19 05/12/20  [provider]    Family History Family History  Problem Relation Age of Onset   Heart attack Father 38   Heart disease  Father    Diabetes Other    Breast cancer Neg Hx     Social History Social History   Tobacco Use   Smoking status: Never   Smokeless tobacco: Never  Vaping Use   Vaping status: Never Used  Substance Use Topics   Alcohol use: Yes    Alcohol/week: 0.0 standard drinks of alcohol    Comment: 2-3 drinks per year   Drug use: Never     Allergies   Sildenafil, Tramadol, Beta adrenergic blockers, Celecoxib, Duloxetine, Gabapentin, Heparin, Hydrocodone-acetaminophen, Indomethacin, Niacin and related, Shellfish allergy, Vicodin [hydrocodone-acetaminophen], Carbamazepine, and Oxycodone   Review of Systems Review of Systems  Musculoskeletal:  Positive for neck pain. Negative for neck stiffness.  Neurological:  Negative for weakness and numbness.     Physical Exam Triage Vital Signs ED Triage Vitals [05/23/23 1802]  Encounter Vitals Group     BP      Systolic BP Percentile      Diastolic BP Percentile      Pulse      Resp      Temp      Temp src      SpO2      Weight      Height      Head Circumference      Peak Flow      Pain Score 6     Pain Loc      Pain Education      Exclude from Growth Chart    No data found.  Updated Vital Signs BP 134/78 (BP Location: Right Arm)   Pulse 88   Temp 97.9 F (36.6 C) (Oral)   Resp 19   SpO2 98%   Visual Acuity Right Eye Distance:   Left Eye Distance:   Bilateral Distance:    Right Eye Near:   Left Eye Near:    Bilateral Near:     Physical Exam Vitals and nursing note reviewed.  Constitutional:       Appearance: Normal appearance. She is not ill-appearing.  HENT:     Head: Normocephalic and atraumatic.  Musculoskeletal:        General: Tenderness present. No signs of injury.  Skin:    General: Skin is warm and dry.     Capillary Refill: Capillary refill takes less than 2 seconds.  Neurological:     General: No focal deficit present.     Mental Status: She is alert and oriented to person, place, and time.      UC Treatments / Results  Labs (all labs ordered are listed, but only abnormal results are displayed) Labs Reviewed - No data to display  EKG   Radiology No results found.  Procedures Procedures (including critical care time)  Medications Ordered in UC Medications - No data to display  Initial Impression / Assessment and Plan / UC Course  I have reviewed the triage vital signs and the nursing notes.  Pertinent labs & imaging results that were available during my care of the patient were reviewed by me and considered in my medical decision making (see chart for details).   Patient is a pleasant, nontoxic-appearing 65 old female presenting for evaluation of bilateral neck pain that started 2 days ago without any associated injury as outlined HPI above.  On exam patient's head and neck are in normal axial alignment.  She has no midline spinous process tenderness or step-off in her cervical or upper thoracic spine.  She does have significant tenderness and  spasm in the upper trapezius muscles bilaterally.  Bilateral grips and upper extremity strength are 5/5.  Patient has limited range of motion to 45 degrees in each direction laterally as well as limited flexion and extension of her neck secondary to pain.  Patient exam is consistent with muscle spasm and I will discharge her home on a low-dose Medrol Dosepak to help with inflammation as she has a GFR of 34 and is unable to take NSAIDs due to her CKD stage III.  I did offer her an injection of Decadron in clinic but she  declined preferring to start the Medrol Dosepak for second morning.  Additionally, I will prescribe 5 mg of baclofen that she can take 3 times a day to help with muscle spasm.  I will also give her home exercises to perform and we discussed using moist heat to help improve blood flow to the muscles and help wash away the lactic acid which is contributing to the spasm and discomfort.  Return precautions reviewed.   Final Clinical Impressions(s) / UC Diagnoses   Final diagnoses:  Muscle spasms of neck     Discharge Instructions      Starting tomorrow morning take the Medrol Dosepak according to the package instructions.  This will help decrease inflammation and aid in pain relief.  You may continue to supplement using over-the-counter Tylenol according to the package instructions as needed for pain.  Starting tonight take the baclofen 5 mg every 8 hours to help with muscle pain and spasm.  Follow the home physical therapy exercises given in your discharge instructions to help loosen up the muscles in your neck and aid in pain relief.  I would start this tomorrow morning.  Apply moist heat to your neck for 20 minutes at a time, 2-3 times a day, to help facilitate removal of the lactic acid which is causing your muscles to be irritated and thereby improve your pain.  If your symptoms are not improving, or new symptoms develop, or your symptoms worsen, please return for reevaluation or see your primary care provider.     ED Prescriptions     Medication Sig Dispense Auth. Provider   Baclofen 5 MG TABS Take 1 tablet (5 mg total) by mouth 3 (three) times daily as needed. 30 tablet Becky Augusta, NP   methylPREDNISolone (MEDROL DOSEPAK) 4 MG TBPK tablet Take according to the package insert. 1 each Becky Augusta, NP      I have reviewed the PDMP during this encounter.   Becky Augusta, NP 05/23/23 Rickey Primus

## 2023-05-23 NOTE — Discharge Instructions (Addendum)
Starting tomorrow morning take the Medrol Dosepak according to the package instructions.  This will help decrease inflammation and aid in pain relief.  You may continue to supplement using over-the-counter Tylenol according to the package instructions as needed for pain.  Starting tonight take the baclofen 5 mg every 8 hours to help with muscle pain and spasm.  Follow the home physical therapy exercises given in your discharge instructions to help loosen up the muscles in your neck and aid in pain relief.  I would start this tomorrow morning.  Apply moist heat to your neck for 20 minutes at a time, 2-3 times a day, to help facilitate removal of the lactic acid which is causing your muscles to be irritated and thereby improve your pain.  If your symptoms are not improving, or new symptoms develop, or your symptoms worsen, please return for reevaluation or see your primary care provider.

## 2023-06-16 ENCOUNTER — Other Ambulatory Visit
Admission: RE | Admit: 2023-06-16 | Discharge: 2023-06-16 | Disposition: A | Discharge status: Home / Self Care | Payer: Medicare Other | Source: Ambulatory Visit | Attending: Internal Medicine | Admitting: Internal Medicine

## 2023-06-16 DIAGNOSIS — R0602 Shortness of breath: Secondary | ICD-10-CM | POA: Insufficient documentation

## 2023-06-16 DIAGNOSIS — R0789 Other chest pain: Secondary | ICD-10-CM | POA: Insufficient documentation

## 2023-06-16 LAB — D-DIMER, QUANTITATIVE: D-Dimer, Quant: 0.78 ug{FEU}/mL — ABNORMAL HIGH (ref 0.00–0.50)

## 2023-06-21 ENCOUNTER — Other Ambulatory Visit: Payer: Self-pay | Admitting: Internal Medicine

## 2023-06-21 DIAGNOSIS — R0789 Other chest pain: Secondary | ICD-10-CM

## 2023-06-21 DIAGNOSIS — R0602 Shortness of breath: Secondary | ICD-10-CM

## 2023-06-21 DIAGNOSIS — R7989 Other specified abnormal findings of blood chemistry: Secondary | ICD-10-CM

## 2023-06-23 ENCOUNTER — Telehealth: Payer: Self-pay | Admitting: Cardiovascular Disease

## 2023-06-23 NOTE — Telephone Encounter (Signed)
   Pre-operative Risk Assessment    Patient Name: Dorothy Rogers  DOB: 1951-04-05 MRN: [id number]   Date of last office visit: 03/21/23 Date of next office visit: n/a   Request for Surgical Clearance    Procedure:  Left THA  Date of Surgery:  Clearance TBD                                Surgeon:  Dr. Janace Litten Group or Practice Name:  [facility] Phone number:  [phone] Fax number:  [phone]   Type of Clearance Requested:   - Medical    Type of Anesthesia:  Not Indicated   Additional requests/questions:    Signed, Narda Amber   06/23/2023, 10:31 AM

## 2023-06-23 NOTE — Telephone Encounter (Signed)
Callback team please contact requesting provider regarding Plavix and potential hold time prior to clearance being completed for upcoming procedure.  Thanks,

## 2023-06-23 NOTE — Telephone Encounter (Signed)
Good Morning Dr. Mariah Milling  We have received a surgical clearance request for Ms. Edling for left THA procedure.  She has a PMH of CAD with DES and proximal LAD and will require guidance on holding Plavix for 5 days before procedure.. Can you please comment on guidance on holding Plavix. Please forward you guidance and recommendations to P CV DIV PREOP   Thanks,  Robin Searing, NP

## 2023-06-23 NOTE — Telephone Encounter (Signed)
Called surgeon's office and confirmed if Plavix needs to be held.

## 2023-06-24 ENCOUNTER — Ambulatory Visit
Admission: RE | Admit: 2023-06-24 | Discharge: 2023-06-24 | Disposition: A | Discharge status: Home / Self Care | Payer: Medicare Other | Source: Ambulatory Visit | Attending: Internal Medicine | Admitting: Internal Medicine

## 2023-06-24 DIAGNOSIS — R7989 Other specified abnormal findings of blood chemistry: Secondary | ICD-10-CM

## 2023-06-24 DIAGNOSIS — R0602 Shortness of breath: Secondary | ICD-10-CM

## 2023-06-24 DIAGNOSIS — R0789 Other chest pain: Secondary | ICD-10-CM

## 2023-06-24 MED ORDER — TECHNETIUM TO 99M ALBUMIN AGGREGATED
4.3100 | Freq: Once | INTRAVENOUS | Status: AC | PRN
  Administered 2023-06-24: 4.31 via INTRAVENOUS

## 2023-06-24 NOTE — Telephone Encounter (Addendum)
   Patient Name: Dorothy Rogers  DOB: 04-16-1951 MRN: [id number]  Primary Cardiologist: None  Chart reviewed as part of pre-operative protocol coverage. Given past medical history and time since last visit, based on ACC/AHA guidelines, JENEVIE CASSTEVENS is at acceptable risk for the planned procedure without further cardiovascular testing.   Per Dr. Mariah Milling, "Acceptable risk for procedure. No further cardiac testing needed  Would hold Plavix 5 days prior to procedure." Please resume Plavix as soon as possible postprocedure, at the discretion of the surgeon.    I will route this recommendation to the requesting party via Epic fax function and remove from pre-op pool.  Please call with questions.  Joylene Grapes, NP 06/24/2023, 12:19 PM

## 2023-07-08 ENCOUNTER — Other Ambulatory Visit: Payer: Self-pay | Admitting: Student

## 2023-07-08 DIAGNOSIS — M542 Cervicalgia: Secondary | ICD-10-CM

## 2023-07-12 ENCOUNTER — Other Ambulatory Visit: Payer: Self-pay | Admitting: Orthopedic Surgery

## 2023-07-18 ENCOUNTER — Ambulatory Visit
Admission: RE | Admit: 2023-07-18 | Discharge: 2023-07-18 | Disposition: A | Discharge status: Home / Self Care | Payer: Medicare Other | Source: Ambulatory Visit | Attending: Student | Admitting: Student

## 2023-07-18 DIAGNOSIS — M542 Cervicalgia: Secondary | ICD-10-CM | POA: Insufficient documentation

## 2023-07-19 ENCOUNTER — Encounter
Admission: RE | Admit: 2023-07-19 | Discharge: 2023-07-19 | Disposition: A | Discharge status: Home / Self Care | Payer: Medicare Other | Source: Ambulatory Visit | Attending: Orthopedic Surgery | Admitting: Orthopedic Surgery

## 2023-07-19 ENCOUNTER — Other Ambulatory Visit: Payer: Self-pay

## 2023-07-19 ENCOUNTER — Inpatient Hospital Stay: Admission: RE | Admit: 2023-07-19 | Payer: BLUE CROSS/BLUE SHIELD | Source: Ambulatory Visit

## 2023-07-19 DIAGNOSIS — Z01812 Encounter for preprocedural laboratory examination: Secondary | ICD-10-CM

## 2023-07-19 DIAGNOSIS — I491 Atrial premature depolarization: Secondary | ICD-10-CM | POA: Insufficient documentation

## 2023-07-19 DIAGNOSIS — Z79899 Other long term (current) drug therapy: Secondary | ICD-10-CM | POA: Insufficient documentation

## 2023-07-19 DIAGNOSIS — R008 Other abnormalities of heart beat: Secondary | ICD-10-CM | POA: Insufficient documentation

## 2023-07-19 DIAGNOSIS — Z01818 Encounter for other preprocedural examination: Secondary | ICD-10-CM | POA: Insufficient documentation

## 2023-07-19 HISTORY — DX: Gastro-esophageal reflux disease without esophagitis: K21.9

## 2023-07-19 HISTORY — DX: Unspecified asthma, uncomplicated: J45.909

## 2023-07-19 HISTORY — DX: Personal history of urinary calculi: Z87.442

## 2023-07-19 HISTORY — DX: Polyneuropathy, unspecified: G62.9

## 2023-07-19 HISTORY — DX: Obstructive sleep apnea (adult) (pediatric): G47.33

## 2023-07-19 HISTORY — DX: Personal history of other diseases of the digestive system: Z87.19

## 2023-07-19 HISTORY — DX: Type 2 diabetes mellitus without complications: E11.9

## 2023-07-19 HISTORY — DX: Hypothyroidism, unspecified: E03.9

## 2023-07-19 HISTORY — DX: Peripheral vascular disease, unspecified: I73.9

## 2023-07-19 HISTORY — DX: Thrombocytopenia, unspecified: D69.6

## 2023-07-19 LAB — CBC WITH DIFFERENTIAL/PLATELET
Abs Immature Granulocytes: 0.03 10*3/uL (ref 0.00–0.07)
Basophils Absolute: 0.1 10*3/uL (ref 0.0–0.1)
Basophils Relative: 1 %
Eosinophils Absolute: 0.1 10*3/uL (ref 0.0–0.5)
Eosinophils Relative: 1 %
HCT: 32 % — ABNORMAL LOW (ref 36.0–46.0)
Hemoglobin: 10.7 g/dL — ABNORMAL LOW (ref 12.0–15.0)
Immature Granulocytes: 0 %
Lymphocytes Relative: 24 %
Lymphs Abs: 2.1 10*3/uL (ref 0.7–4.0)
MCH: 30.9 pg (ref 26.0–34.0)
MCHC: 33.4 g/dL (ref 30.0–36.0)
MCV: 92.5 fL (ref 80.0–100.0)
Monocytes Absolute: 0.6 10*3/uL (ref 0.1–1.0)
Monocytes Relative: 7 %
Neutro Abs: 5.8 10*3/uL (ref 1.7–7.7)
Neutrophils Relative %: 67 %
Platelets: 223 10*3/uL (ref 150–400)
RBC: 3.46 MIL/uL — ABNORMAL LOW (ref 3.87–5.11)
RDW: 12.8 % (ref 11.5–15.5)
WBC: 8.7 10*3/uL (ref 4.0–10.5)
nRBC: 0 % (ref 0.0–0.2)

## 2023-07-19 LAB — COMPREHENSIVE METABOLIC PANEL
ALT: 20 U/L (ref 0–44)
AST: 21 U/L (ref 15–41)
Albumin: 4 g/dL (ref 3.5–5.0)
Alkaline Phosphatase: 75 U/L (ref 38–126)
Anion gap: 11 (ref 5–15)
BUN: 34 mg/dL — ABNORMAL HIGH (ref 8–23)
CO2: 22 mmol/L (ref 22–32)
Calcium: 9 mg/dL (ref 8.9–10.3)
Chloride: 104 mmol/L (ref 98–111)
Creatinine, Ser: 1.74 mg/dL — ABNORMAL HIGH (ref 0.44–1.00)
GFR, Estimated: 31 mL/min — ABNORMAL LOW (ref >60)
Glucose, Bld: 167 mg/dL — ABNORMAL HIGH (ref 70–99)
Potassium: 4.3 mmol/L (ref 3.5–5.1)
Sodium: 137 mmol/L (ref 135–145)
Total Bilirubin: 0.7 mg/dL (ref 0.0–1.2)
Total Protein: 7.1 g/dL (ref 6.5–8.1)

## 2023-07-19 LAB — TYPE AND SCREEN
ABO/RH(D): A NEG
Antibody Screen: NEGATIVE

## 2023-07-19 LAB — URINALYSIS, ROUTINE W REFLEX MICROSCOPIC
Bilirubin Urine: NEGATIVE
Glucose, UA: NEGATIVE mg/dL
Ketones, ur: NEGATIVE mg/dL
Nitrite: NEGATIVE
Protein, ur: NEGATIVE mg/dL
Specific Gravity, Urine: 1.01 (ref 1.005–1.030)
pH: 5 (ref 5.0–8.0)

## 2023-07-19 LAB — SURGICAL PCR SCREEN
MRSA, PCR: NEGATIVE
Staphylococcus aureus: NEGATIVE

## 2023-07-19 NOTE — Patient Instructions (Signed)
 Your procedure is scheduled on: Thursday, February 27 Report to the Registration Desk on the 1st floor of the [facility] To find out your arrival time, please call [phone] between 1PM - 3PM on: Wednesday, February 26 If your arrival time is 6:00 am, do not arrive before that time as the [facility] entrance doors do not open until 6:00 am.  REMEMBER: Instructions that are not followed completely may result in serious medical risk, up to and including death; or upon the discretion of your surgeon and anesthesiologist your surgery may need to be rescheduled.  Do not eat food after midnight the night before surgery.  No gum chewing or hard candies.  You may however, drink water up to 2 hours before you are scheduled to arrive for your surgery. Do not drink anything within 2 hours of your scheduled arrival time.  In addition, your doctor has ordered for you to drink the provided:  Gatorade G2 Drinking this carbohydrate drink up to two hours before surgery helps to reduce insulin resistance and improve patient outcomes. Please complete drinking 2 hours before scheduled arrival time.  One week prior to surgery: starting February 20 Stop Anti-inflammatories (NSAIDS) such as Advil, Aleve, Ibuprofen, Motrin, Naproxen, Naprosyn and Aspirin based products such as Excedrin, Goody's Powder, BC Powder. Stop ANY OVER THE COUNTER supplements until after surgery. Stop biotin, calcium, vitamin D, probiotic.  You may however, continue to take Tylenol if needed for pain up until the day of surgery.  clopidogrel (PLAVIX) - hold for 5 days before surgery. Last day to take is Friday, February 21. Resume AFTER surgery per surgeon instruction.  Continue taking all of your other prescription medications up until the day of surgery.  Do NOT take any insulin on the morning of surgery.  ON THE DAY OF SURGERY ONLY TAKE THESE MEDICATIONS WITH SIPS OF WATER:  diltiazem (CARDIZEM CD)  levothyroxine  (SYNTHROID)  LORazepam (ATIVAN) if needed for anxiety pantoprazole (PROTONIX)   No Alcohol for 24 hours before or after surgery.  No Smoking including e-cigarettes for 24 hours before surgery.  No chewable tobacco products for at least 6 hours before surgery.  No nicotine patches on the day of surgery.  Do not use any "recreational" drugs for at least a week (preferably 2 weeks) before your surgery.  Please be advised that the combination of cocaine and anesthesia may have negative outcomes, up to and including death. If you test positive for cocaine, your surgery will be cancelled.  On the morning of surgery brush your teeth with toothpaste and water, you may rinse your mouth with mouthwash if you wish. Do not swallow any toothpaste or mouthwash.  Use CHG Soap as directed on instruction sheet.  Do not wear jewelry, make-up, hairpins, clips or nail polish.  For welded (permanent) jewelry: bracelets, anklets, waist bands, etc.  Please have this removed prior to surgery.  If it is not removed, there is a chance that hospital personnel will need to cut it off on the day of surgery.  Do not wear lotions, powders, or perfumes.   Do not shave body hair from the neck down 48 hours before surgery.  Contact lenses, hearing aids and dentures may not be worn into surgery.  Do not bring valuables to the hospital. [facility] is not responsible for any missing/lost belongings or valuables.   Notify your doctor if there is any change in your medical condition (cold, fever, infection).  Wear comfortable clothing (specific to your surgery  type) to the hospital.  After surgery, you can help prevent lung complications by doing breathing exercises.  Take deep breaths and cough every 1-2 hours. Your doctor may order a device called an Incentive Spirometer to help you take deep breaths.  If you are being admitted to the hospital overnight, leave your suitcase in the car. After surgery it may be  brought to your room.  In case of increased patient census, it may be necessary for you, the patient, to continue your postoperative care in the Same Day Surgery department.  If you are being discharged the day of surgery, you will not be allowed to drive home. You will need a responsible individual to drive you home and stay with you for 24 hours after surgery.   If you are taking public transportation, you will need to have a responsible individual with you.  Please call the Pre-admissions Testing Dept. at [phone] if you have any questions about these instructions.  Surgery Visitation Policy:  Patients having surgery or a procedure may have two visitors.  Children under the age of 1 must have an adult with them who is not the patient.  Temporary Visitor Restrictions Due to increasing cases of flu, RSV and COVID-19: Children ages 1 and under will not be able to visit patients in [facility] hospitals under most circumstances.  Inpatient Visitation:    Visiting hours are 7 a.m. to 8 p.m. Up to four visitors are allowed at one time in a patient room. The visitors may rotate out with other people during the day.  One visitor age 27 or older may stay with the patient overnight and must be in the room by 8 p.m.     Pre-operative 5 CHG Bath Instructions   You can play a key role in reducing the risk of infection after surgery. Your skin needs to be as free of germs as possible. You can reduce the number of germs on your skin by washing with CHG (chlorhexidine gluconate) soap before surgery. CHG is an antiseptic soap that kills germs and continues to kill germs even after washing.   DO NOT use if you have an allergy to chlorhexidine/CHG or antibacterial soaps. If your skin becomes reddened or irritated, stop using the CHG and notify one of our RNs at [phone].   Please shower with the CHG soap starting 4 days before surgery using the following schedule:     Please keep  in mind the following:  DO NOT shave, including legs and underarms, starting the day of your first shower.   You may shave your face at any point before/day of surgery.  Place clean sheets on your bed the day you start using CHG soap. Use a clean washcloth (not used since being washed) for each shower. DO NOT sleep with pets once you start using the CHG.   CHG Shower Instructions:  If you choose to wash your hair and private area, wash first with your normal shampoo/soap.  After you use shampoo/soap, rinse your hair and body thoroughly to remove shampoo/soap residue.  Turn the water OFF and apply about 3 tablespoons (45 ml) of CHG soap to a CLEAN washcloth.  Apply CHG soap ONLY FROM YOUR NECK DOWN TO YOUR TOES (washing for 3-5 minutes)  DO NOT use CHG soap on face, private areas, open wounds, or sores.  Pay special attention to the area where your surgery is being performed.  If you are having back surgery, having someone wash your  back for you may be helpful. Wait 2 minutes after CHG soap is applied, then you may rinse off the CHG soap.  Pat dry with a clean towel  Put on clean clothes/pajamas   If you choose to wear lotion, please use ONLY the CHG-compatible lotions on the back of this paper.     Additional instructions for the day of surgery: DO NOT APPLY any lotions, deodorants, cologne, or perfumes.   Put on clean/comfortable clothes.  Brush your teeth.  Ask your nurse before applying any prescription medications to the skin.      CHG Compatible Lotions   Aveeno Moisturizing lotion  Cetaphil Moisturizing Cream  Cetaphil Moisturizing Lotion  Clairol Herbal Essence Moisturizing Lotion, Dry Skin  Clairol Herbal Essence Moisturizing Lotion, Extra Dry Skin  Clairol Herbal Essence Moisturizing Lotion, Normal Skin  Curel Age Defying Therapeutic Moisturizing Lotion with Alpha Hydroxy  Curel Extreme Care Body Lotion  Curel Soothing Hands Moisturizing Hand Lotion  Curel  Therapeutic Moisturizing Cream, Fragrance-Free  Curel Therapeutic Moisturizing Lotion, Fragrance-Free  Curel Therapeutic Moisturizing Lotion, Original Formula  Eucerin Daily Replenishing Lotion  Eucerin Dry Skin Therapy Plus Alpha Hydroxy Crme  Eucerin Dry Skin Therapy Plus Alpha Hydroxy Lotion  Eucerin Original Crme  Eucerin Original Lotion  Eucerin Plus Crme Eucerin Plus Lotion  Eucerin TriLipid Replenishing Lotion  Keri Anti-Bacterial Hand Lotion  Keri Deep Conditioning Original Lotion Dry Skin Formula Softly Scented  Keri Deep Conditioning Original Lotion, Fragrance Free Sensitive Skin Formula  Keri Lotion Fast Absorbing Fragrance Free Sensitive Skin Formula  Keri Lotion Fast Absorbing Softly Scented Dry Skin Formula  Keri Original Lotion  Keri Skin Renewal Lotion Keri Silky Smooth Lotion  Keri Silky Smooth Sensitive Skin Lotion  Nivea Body Creamy Conditioning Oil  Nivea Body Extra Enriched Lotion  Nivea Body Original Lotion  Nivea Body Sheer Moisturizing Lotion Nivea Crme  Nivea Skin Firming Lotion  NutraDerm 30 Skin Lotion  NutraDerm Skin Lotion  NutraDerm Therapeutic Skin Cream  NutraDerm Therapeutic Skin Lotion  ProShield Protective Hand Cream  Provon moisturizing lotion       Preoperative Educational Videos for Total Hip, Knee and Shoulder Replacements  To better prepare for surgery, please view our videos that explain the physical activity and discharge planning required to have the best surgical recovery at [facility].  [email or web address]  Questions? Call [phone] or email [email or web address]

## 2023-07-21 LAB — URINE CULTURE

## 2023-07-26 ENCOUNTER — Encounter: Payer: Self-pay | Admitting: Orthopedic Surgery

## 2023-07-26 NOTE — Progress Notes (Signed)
 Perioperative / Anesthesia Services  Pre-Admission Testing Clinical Review / Pre-Operative Anesthesia Consult  Date: 07/27/23  Patient Demographics:  Name: Dorothy Rogers DOB: [birth year] MRN:   [id number]  Planned Surgical Procedure(s):    Case: [id number] Date/Time: 07/28/23 0715   Procedure: TOTAL HIP ARTHROPLASTY ANTERIOR APPROACH (Left: Hip)   Anesthesia type: Choice   Pre-op diagnosis:      Primary osteoarthritis of both hips M16.0     Bilateral hip pain M25.551, M25.552   Location: [facility] OR ROOM 01 / [facility]   Surgeons: Reinaldo Berber, MD      NOTE: Available PAT nursing documentation and vital signs have been reviewed. Clinical nursing staff has updated patient's PMH/PSHx, current medication list, and drug allergies/intolerances to ensure comprehensive history available to assist in medical decision making as it pertains to the aforementioned surgical procedure and anticipated anesthetic course. Extensive review of available clinical information personally performed. [place] PMH and PSHx updated with any diagnoses/procedures that  may have been inadvertently omitted during his intake with the pre-admission testing department's nursing staff.  Clinical Discussion:  Dorothy Rogers is a 73 y.o. female who is submitted for pre-surgical anesthesia review and clearance prior to her undergoing the above procedure. Patient has never been a smoker in the past. Pertinent PMH includes: CAD, diastolic dysfunction, pulmonary hypertension, PVD, aortic atherosclerosis, HTN, HLD, T2DM, hypothyroidism, CKD-III, chronic shortness of breath, hemidiaphragm paralysis, asthma, OSAH (no nocturnal PAP therapy), GERD (on daily PPI), hiatal hernia, anemia, thrombocytopenia, nephrolithiasis, OA, cervicalgia, kyphoscoliosis chronic lower back pain (s/p multiple lumbar fusions and SCS placement), anxiety (on BZO PRN).  Patient is followed by cardiology Mariah Milling, MD). She was last seen  in the cardiology clinic on 03/21/2023; notes reviewed. At the time of her clinic visit, patient doing well overall from a cardiovascular perspective. Patient with chronic dyspnea related to her hemidiaphragm paralysis and kyphoscoliosis. Patient previously seen by a "specialist" in [place], at which time she was advised that she was not a candidate for surgical intervention. Respiratory status stable and at baseline. Patient denied any chest pain, shortness of breath, PND, orthopnea, palpitations, significant peripheral edema, weakness, fatigue, vertiginous symptoms, or presyncope/syncope. Patient with a past medical history significant for cardiovascular diagnoses. Documented physical exam was grossly benign, providing no evidence of acute exacerbation and/or decompensation of the patient's known cardiovascular conditions.  Of note, complete records regarding patient's cardiovascular history unavailable for review at time of consult.  Information gathered from patient report and from extensive review of notes provided by her local care providers.  Patient with significant angina back in 2009.  She underwent 2 separate PCI procedures placing a total of 6 stents (unknown type) to the RCA and LAD.  Myocardial PET scan was performed on 09/17/2015 revealing a normal left ventricular systolic function with an EF of >65%.  Left ventricular cavity size is relatively small.  Attenuation correction imaging showed post PCI findings, a large hiatal hernia, and cholelithiasis.  No radioisotope tracer uptake to suggest stress-induced perfusion abnormalities; no evidence of ischemia or scar.  Study determined to be normal and low risk.  Diagnostic LEFT heart catheterization performed on 10/15/2015 revealing a normal left ventricular systolic function with a normal EF.  LVEDP = 6 mmHg.  There was nonflow limiting coronary artery disease including a 50-60% mid RCA stenosis.  Diffuse disease noted in the LAD.  Previously  placed stents noted to be patent.  There were no targets for PCI.  Medical management was recommended.  Cardiac MRI was performed on 09/19/2017 revealing a normal left ventricular systolic function with an EF of 66%.  There were no regional wall motion abnormalities.  Small crypt in the mid chamber of the anteroseptal wall noted.  Finding likely suggestive of a healed VSD.  Right ventricular size and function normal.  There was no LGE to suggest ischemia, infarction, scar, or infiltrative disease.  There was no evidence of stress-induced myocardial ischemia.  No intracardiac thrombus noted.  Study determined to be normal.  Most recent TTE was performed on 06/22/2023 revealing a normal left ventricular systolic function with an EF of >55%.  There were no regional wall motion abnormalities. Left ventricular diastolic Doppler parameters consistent with abnormal relaxation (G1DD).  GLS -18%.  Right ventricular size and function normal.  RVSP = 33 mmHg.  There was trivial to mild pan valvular regurgitation. All transvalvular gradients were noted to be normal providing no evidence suggestive of valvular stenosis. Aorta normal in size with no evidence of ectasia or aneurysmal dilatation.  Given patient's PVD and history of CAD with stent placement, she remains on daily DAPT therapy using ASA + clopidogrel.  Patient is reportedly compliant with therapy with no evidence or reports of GI/GU related bleeding.  Blood pressure well-controlled at 128/62 on currently prescribed CCB (diltiazem) and ARB (losartan) therapies.  Patient is on atorvastatin + ezetimibe for HLD diagnosis and further ASCVD prevention.  T2DM loosely controlled on currently prescribed regimen; last HgbA1c at the time of her cardiology visit was 8.5% when checked on 01/24/2023.  Of note, patient has had A1c retested since last being seen by cardiology with some improvement to 7.9% when checked on 05/30/2023.  Patient does have an OSAH diagnosis, however  she does not require the use of nocturnal PAP therapy.  Functional capacity limited by orthopedic pain.  With that said, patient able to complete all of her ADL/IADLs without cardiovascular limitation.  Per the DASI, patient is able to achieve at least 4 METS of activity without experiencing any significant degrees of angina/anginal equivalent symptoms. No changes were made to her medication regimen during her visit with cardiology.  Patient scheduled to follow-up with outpatient cardiology in 1 year or sooner if needed.  Dorothy Rogers is scheduled for an elective TOTAL HIP ARTHROPLASTY ANTERIOR APPROACH (Left: Hip) on 07/28/2023 with Dr. Reinaldo Berber, MD. Given patient's past medical history significant for cardiovascular diagnoses, presurgical clearances were sought from both cardiology and pulmonary medicine by the PAT team.   Per cardiology, "based ACC/AHA guidelines, the patient's past medical history, and the amount of time since her last clinic visit, this patient would be at an overall ACCEPTABLE risk for the planned procedure without further cardiovascular testing or intervention at this time".   Clearance from pulmonary medicine pending. I have reached out to Dr. Karna Christmas, MD. Patient was recently seen in the pulmonology clinic on 07/07/2023; notes reviewed. Patient overall stable. MD noted issues with the patient's SOB related to diaphragm paralysis and kyphoscoliosis. Pulmonary function tests have shown significant improvement in FEV1 and DLCO over the past few years, most recent FEV1 1.58 L (78%), DLCO 85% (01/04/2023). Discussed the use of an incentive spirometer to maintain lung function. Patient s/p recent PSG (06/15/2023) indicating (+) OSAH; AHI was 18, lowest oxygen saturation 84.4%, loud snoring 12.1%, very loud snoring 11.8%. Patient is being set up to initiate nocturnal PAP therapy. DME has been ordered. As of the time of her PAT visit, patient had not started using the ordered  PAP  therapy. With regards to clearance, last office note stated, "chronic hip and back pain with a history of hip repair. Upcoming hip surgery scheduled for July 28, 2023. Discussed the need for preoperative clearance from a cardiologist due to Plavix use. Provide necessary documentation for preoperative clearance. Ensure follow-up with cardiologist for preoperative evaluation on July 13, 2023".  Again, this patient is on daily oral DAPT therapy. She has been instructed on recommendations for holding her clopidogrel for 5 days prior to her procedure with plans to restart as soon as postoperative bleeding risk felt to be minimized by his attending surgeon. The patient has been instructed that her last dose of clopidogrel should be on 07/22/2023. Given that patient's past medical history is significant for cardiovascular diagnoses, including but not limited to CAD, orthopedics has cleared patient to continue her daily low dose ASA throughout her perioperative course.  Patient has been updated on these directives from her specialty care providers by the PAT team.  Patient denies previous perioperative complications with anesthesia in the past. In review her EMR, it is noted that patient underwent a general anesthetic course at [facility] (ASA IV) in 02/2022 without documented complications.      07/19/2023    3:16 PM 05/23/2023    6:02 PM 03/21/2023    3:16 PM  Vitals with BMI  Height 5' 2.5"  5' 2.5"  Weight 175 lbs  175 lbs  BMI 31.48  31.48  Systolic 140 134 161  Diastolic 80 78 62  Pulse 100 88 98   Providers/Specialists:  NOTE: Primary physician provider listed below. Patient may have been seen by APP or partner within same practice.   PROVIDER ROLE / SPECIALTY LAST Wilber Bihari, MD Orthopedics (Surgeon) 07/13/2023  Marguarite Arbour, MD Primary Care Provider 06/16/2023  Julien Nordmann, MD Cardiology 03/21/2023; preop APP call 06/24/2023  Vida Rigger, MD Pulmonary  Medicine 07/07/2023   Allergies:   Allergies  Allergen Reactions   Sildenafil Anaphylaxis   Tramadol Swelling   Beta Adrenergic Blockers Hives   Celecoxib Other (See Comments)    A lot of pressure/tightness in head   Duloxetine     Other Reaction(s): Jittery   Gabapentin Other (See Comments)    Other Reaction(s): dizziness, arms weak with higher doses only   Heparin Rash   Hydrocodone-Acetaminophen Other (See Comments)    Other Reaction(s): Hypoglycemic episodes   Indomethacin     Other Reaction(s): "I felt like she was having a heart attack"   Niacin And Related Other (See Comments)    Other Reaction(s): Flushing and hot sensation   Shellfish Allergy Nausea And Vomiting   Vicodin [Hydrocodone-Acetaminophen] Other (See Comments)    Hypoglycemic episodes   Carbamazepine Nausea Only and Rash   Oxycodone Nausea Only    Other Reaction(s): GI Intolerance   Current Home Medications:   No current facility-administered medications for this encounter.    acetaminophen (TYLENOL) 500 MG tablet   acyclovir (ZOVIRAX) 400 MG tablet   amitriptyline (ELAVIL) 10 MG tablet   aspirin EC 81 MG EC tablet   atorvastatin (LIPITOR) 40 MG tablet   Biotin 10 MG TABS   Calcium Carbonate-Vitamin D 600-400 MG-UNIT tablet   Cholecalciferol (VITAMIN D-3) 125 MCG (5000 UT) TABS   clopidogrel (PLAVIX) 75 MG tablet   diltiazem (CARDIZEM CD) 180 MG 24 hr capsule   ezetimibe (ZETIA) 10 MG tablet   ferrous sulfate 325 (65 FE) MG tablet   glipiZIDE (GLUCOTROL) 10 MG tablet  insulin glargine (LANTUS SOLOSTAR) 100 UNIT/ML Solostar Pen   insulin lispro (HUMALOG) 100 UNIT/ML KwikPen   levothyroxine (SYNTHROID) 50 MCG tablet   LORazepam (ATIVAN) 1 MG tablet   losartan (COZAAR) 100 MG tablet   ondansetron (ZOFRAN-ODT) 8 MG disintegrating tablet   pantoprazole (PROTONIX) 20 MG tablet   Probiotic CAPS    sodium chloride flush (NS) 0.9 % injection 3 mL   History:   Past Medical History:  Diagnosis Date    Anemia    Anxiety    a.) on BZO PRN (lorazepam)   Aortic atherosclerosis (HCC)    Arthritis    lower back, hips   Asthma    Blood transfusion without reported diagnosis    CAD (coronary artery disease)    a.) PCI 07/2007 - stents x 2 (unknown type); b.) PCI 03/2008 - stents x 4 (unknown type); c.) cPET 09/16/2017: no LGE; no ischemia; d.) LHC 10/15/2015: 50-60% mRCA, diffuse mild LAD disease. 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   a.) following repair of hiatal hernia; b.) has been seen by a specialist in [place] and advised not a surgical candidate   Diastolic dysfunction    a.) TTE 09/17/2015: EF 65-70%, no RWMAs, G1DD, triv TR/PR; b.) TTE 02/10/2016: EF >55%, no RWMAs, G1DD, triv AR/MR, mild TR/PR; c.) TTE 01/05/2019: EF 60-65%, mild LVH, no RWMAs, G1DD; d.) TTE 08/18/2021: EF >55%, no RWMAs, G1DD, triv PR, mild MR/TR; e.) TTE 06/22/2023: EF >55%, no RWMAs, G1DD, triv AR/TR/PR, mild MR   GERD (gastroesophageal reflux disease)    Herpes simplex    History of coronary angioplasty with insertion of stent 2009   a.) Total of 6 coronary stents as of 07/26/2023   History of hiatal hernia    History of kidney stones    Hyperlipidemia    Hypertension    Hypothyroidism    Intractable nausea and vomiting 06/09/2015   Long-term use of aspirin therapy    Malignant fibrous histiocytoma (posterior neck) 1986   Obstructive sleep apnea    a.) no nocuturnal PAP therapy   On long term clopidogrel therapy    Peripheral neuropathy    Peripheral  vascular disease (HCC)    Pulmonary hypertension (HCC) 05/10/2019   a.) RHC 05/10/2019: mRA 0, mPA 12, mPWCP 0, AO sat 98, PA sat 72.2, CO 3.74, CI 2.04   Status post bilateral cataract extraction    Thrombocytopenia (HCC)    Type 2 diabetes mellitus without complication (HCC)    Vertigo    Vitamin B 12 deficiency    Past Surgical History:  Procedure Laterality Date   75 HOUR PH STUDY N/A 02/04/2016   Procedure: 24 HOUR PH STUDY;  Surgeon: Midge Minium, MD;  Location: [facility] ENDOSCOPY;  Service: Endoscopy;  Laterality: N/A;   CATARACT EXTRACTION W/ INTRAOCULAR LENS IMPLANT Left 11/14/2013   CATARACT EXTRACTION W/PHACO Right 03/19/2015   Procedure: CATARACT EXTRACTION PHACO AND INTRAOCULAR LENS PLACEMENT (IOC);  Surgeon: Lockie Mola, MD;  Location: [facility];  Service: Ophthalmology;  Laterality: Right;  DIABETIC - insulin and oral meds   CHOLECYSTECTOMY N/A 05/19/2016   Procedure: LAPAROSCOPIC CHOLECYSTECTOMY;  Surgeon: Ricarda Frame, MD;  Location: [facility] ORS;  Service: General;  Laterality: N/A;   CORONARY ANGIOPLASTY WITH STENT PLACEMENT  03/2008   4 stents placed   CORONARY ANGIOPLASTY WITH STENT PLACEMENT  07/2007   2 stents placed   CYSTECTOMY Right    wrist   ESOPHAGEAL MANOMETRY N/A 02/04/2016   Procedure: ESOPHAGEAL MANOMETRY (EM);  Surgeon: Midge Minium, MD;  Location: [facility] END[facility];  Service: Endoscopy;  Laterality: N/A;   ESOPHAGOGASTRODUODENOSCOPY (EGD) WITH PROPOFOL N/A 09/04/2015   Procedure: ESOPHAGOGASTRODUODENOSCOPY (EGD) WITH PROPOFOL;  Surgeon: Midge Minium, MD;  Location: [facility];  Service: Endoscopy;  Laterality: N/A;  INSULIN DEPENDENT DIABETIC   ESOPHAGOGASTRODUODENOSCOPY (EGD) WITH PROPOFOL N/A 06/29/2016   Procedure: ESOPHAGOGASTRODUODENOSCOPY (EGD) WITH PROPOFOL;  Surgeon: Wyline Mood, MD;  Location: [facility] END[facility];  Service: Endoscopy;  Laterality: N/A;   ESOPHAGOGASTRODUODENOSCOPY (EGD) WITH PROPOFOL N/A 01/25/2019   Procedure:  ESOPHAGOGASTRODUODENOSCOPY (EGD) WITH PROPOFOL;  Surgeon: Wyline Mood, MD;  Location: [facility] END[facility]COPY;  Service: Gastroenterology;  Laterality: N/A;   HIATAL HERNIA REPAIR  02/2016   Dr Tonita Cong   LEFT HEART CATH AND CORONARY ANGIOGRAPHY Left 08/18/2017   Procedure: LEFT HEART CATH AND CORONARY ANGIOGRAPHY;  Surgeon: Dalia Heading, MD;  Location: [facility];  Service: Cardiovascular;  Laterality: Left;   LEFT HEART CATH AND CORONARY ANGIOGRAPHY Left 10/15/2015   LUMBAR FUSION  2001   L1-2   POSTERIOR LUMBAR FUSION  03/27/2013   L4-5   POSTERIOR LUMBAR FUSION  05/09/2014   L3-4   POSTERIOR LUMBAR FUSION  05/30/2015   L2-3   POSTERIOR LUMBAR FUSION  01/21/2017   L1-2, 2-3, 3-4   RIGHT HEART CATH N/A 05/10/2019   Procedure: RIGHT HEART CATH;  Surgeon: Dalia Heading, MD;  Location: [facility];  Service: Cardiovascular;  Laterality: N/A;   SPINAL CORD STIMULATOR IMPLANT  02/2022   TUMOR EXCISION  1986   back of neck malignant fibrous histiocytoma   VAGINAL HYSTERECTOMY  1992   Family History  Problem Relation Age of Onset   Heart attack Father 21   Heart disease Father    Diabetes Other    Breast cancer Neg Hx    Social History   Tobacco Use   Smoking status: Never   Smokeless tobacco: Never  Substance Use Topics   Alcohol use: Yes    Alcohol/week: 0.0 standard drinks of alcohol    Comment: 2-3 drinks per year   Pertinent Clinical Results:  LABS:  Preadmission on 07/28/2023  Component Date Value Ref Range Status   Specimen Description 07/19/2023    Final                   Value:URINE, RANDOM Performed at [facility], [address], [place], [place] [address]    Special Requests 07/19/2023    Final                   Value:NONE Performed at [facility], [address] [place], [place] [address]    Culture 07/19/2023 MULTIPLE SPECIES PRESENT, SUGGEST RECOLLECTION (A)   Final   Report Status 07/19/2023 07/21/2023 FINAL   Final   Hospital Outpatient Visit on 07/19/2023  Component Date Value Ref Range Status   MRSA, PCR 07/19/2023 NEGATIVE  NEGATIVE Final   Staphylococcus aureus 07/19/2023 NEGATIVE  NEGATIVE Final  Comment: (NOTE) The Xpert SA Assay (FDA approved for NASAL specimens in patients 15 years of age and older), is one component of a comprehensive surveillance program. It is not intended to diagnose infection nor to guide or monitor treatment. Performed at [facility], [address] [place], [place] [address]    WBC 07/19/2023 8.7  4.0 - 10.5 K/uL Final   RBC 07/19/2023 3.46 (L)  3.87 - 5.11 MIL/uL Final   Hemoglobin 07/19/2023 10.7 (L)  12.0 - 15.0 g/dL Final   HCT 60/45/4098 32.0 (L)  36.0 - 46.0 % Final   MCV 07/19/2023 92.5  80.0 - 100.0 fL Final   MCH 07/19/2023 30.9  26.0 - 34.0 pg Final   MCHC 07/19/2023 33.4  30.0 - 36.0 g/dL Final   RDW 11/91/4782 12.8  11.5 - 15.5 % Final   Platelets 07/19/2023 223  150 - 400 K/uL Final   nRBC 07/19/2023 0.0  0.0 - 0.2 % Final   Neutrophils Relative % 07/19/2023 67  % Final   Neutro Abs 07/19/2023 5.8  1.7 - 7.7 K/uL Final   Lymphocytes Relative 07/19/2023 24  % Final   Lymphs Abs 07/19/2023 2.1  0.7 - 4.0 K/uL Final   Monocytes Relative 07/19/2023 7  % Final   Monocytes Absolute 07/19/2023 0.6  0.1 - 1.0 K/uL Final   Eosinophils Relative 07/19/2023 1  % Final   Eosinophils Absolute 07/19/2023 0.1  0.0 - 0.5 K/uL Final   Basophils Relative 07/19/2023 1  % Final   Basophils Absolute 07/19/2023 0.1  0.0 - 0.1 K/uL Final   Immature Granulocytes 07/19/2023 0  % Final   Abs Immature Granulocytes 07/19/2023 0.03  0.00 - 0.07 K/uL Final   Performed at [facility], [address] [place], [place] [address]   Sodium 07/19/2023 137  135 - 145 mmol/L Final   Potassium 07/19/2023 4.3  3.5 - 5.1 mmol/L Final   Chloride 07/19/2023 104  98 - 111 mmol/L Final   CO2 07/19/2023 22  22 - 32 mmol/L Final   Glucose, Bld 07/19/2023 167 (H)  70 -  99 mg/dL Final   Glucose reference range applies only to samples taken after fasting for at least 8 hours.   BUN 07/19/2023 34 (H)  8 - 23 mg/dL Final   Creatinine, Ser 07/19/2023 1.74 (H)  0.44 - 1.00 mg/dL Final   Calcium 30/86/5784 9.0  8.9 - 10.3 mg/dL Final   Total Protein 69/62/9528 7.1  6.5 - 8.1 g/dL Final   Albumin 41/32/4401 4.0  3.5 - 5.0 g/dL Final   AST 02/72/5366 21  15 - 41 U/L Final   ALT 07/19/2023 20  0 - 44 U/L Final   Alkaline Phosphatase 07/19/2023 75  38 - 126 U/L Final   Total Bilirubin 07/19/2023 0.7  0.0 - 1.2 mg/dL Final   GFR, Estimated 07/19/2023 31 (L)  >60 mL/min Final   Comment: (NOTE) Calculated using the CKD-EPI Creatinine Equation (2021)    Anion gap 07/19/2023 11  5 - 15 Final   Performed at [facility], [address] [place], [place] [address]   Color, Urine 07/19/2023 STRAW (A)  YELLOW Final   APPearance 07/19/2023 HAZY (A)  CLEAR Final   Specific Gravity, Urine 07/19/2023 1.010  1.005 - 1.030 Final   pH 07/19/2023 5.0  5.0 - 8.0 Final   Glucose, UA 07/19/2023 NEGATIVE  NEGATIVE mg/dL Final   Hgb urine dipstick 07/19/2023 SMALL (A)  NEGATIVE Final  Bilirubin Urine 07/19/2023 NEGATIVE  NEGATIVE Final   Ketones, ur 07/19/2023 NEGATIVE  NEGATIVE mg/dL Final   Protein, ur 16/03/9603 NEGATIVE  NEGATIVE mg/dL Final   Nitrite 54/01/8118 NEGATIVE  NEGATIVE Final   Leukocytes,Ua 07/19/2023 LARGE (A)  NEGATIVE Final   RBC / HPF 07/19/2023 0-5  0 - 5 RBC/hpf Final   WBC, UA 07/19/2023 11-20  0 - 5 WBC/hpf Final   Bacteria, UA 07/19/2023 RARE (A)  NONE SEEN Final   Squamous Epithelial / HPF 07/19/2023 6-10  0 - 5 /HPF Final   Hyaline Casts, UA 07/19/2023 PRESENT   Final   Performed at [facility], [address] [place], [place] [address]   ABO/RH(D) 07/19/2023 A NEG   Final   Antibody Screen 07/19/2023 NEG   Final   Sample Expiration 07/19/2023 08/02/2023,2359   Final   Extend sample reason 07/19/2023    Final                    Value:NO TRANSFUSIONS OR PREGNANCY IN THE PAST 3 MONTHS Performed at [facility], [address] [place], [place] [address]     ECG: Date: 07/19/2023  Time ECG obtained: 1530 PM Rate: 81 bpm Rhythm:  Sinus rhythm with PACs in a pattern of bigeminy Axis (leads I and aVF): normal Intervals: PR 168 ms. QRS 86 ms. QTc 434 ms. ST segment and T wave changes: No evidence of acute T wave abnormalities or significant ST segment elevation or depression.  Evidence of a possible, age undetermined, prior infarct:  Yes Comparison: Similar to previous tracing obtained on 03/21/2023.  PACs now present.   IMAGING / PROCEDURES: MR CERVICAL SPINE WO CONTRAST performed on 07/18/2023 Results pending as of 11:00 AM on 07/27/2023  NM PULMONARY PERFUSION performed on 06/24/2023 Pulmonary perfusion imaging is completely normal in 8 different projections with no evidence of any perfusion defects. Normal nuclear medicine pulmonary perfusion scan without evidence to suggest pulmonary embolism.  DIAGNOSTIC RADIOGRAPHS OF CHEST 2 VIEWS performed on 06/24/2023 Cardiac silhouette is normal in size. No mediastinal or hilar masses.  Small hiatal hernia.  No evidence of adenopathy. Clear lungs. No pleural effusion or pneumothorax. Previous lumbar spine posterior fusion, stable.  Spine stimulator leads project along the posterior aspect of the midthoracic spinal canal. Skeletal structures are intact. No active cardiopulmonary disease.  RIGHT HEART CATHETERIZATION performed on 05/10/2019 Hemodynamics RA 2/1-0 mmHg RV 24/0--2 mmHg PA 24/3-12 mmHg PCWP 3/1-0 mmHg Oxygen saturations: PA 72.2 AO 98 Cardiac Output (Fick) 3.72  Cardiac Index (Fick) 2.04  Final Conclusions:  Mild pulmonary hypertension Recommendations:  Refer back to pulmonary for further considerations.   TRANSTHORACIC ECHOCARDIOGRAM performed on 01/05/2019 The left ventricle has normal systolic function with an ejection  fraction of 60-65%. The cavity size was normal. There is mild concentric left ventricular hypertrophy. Left ventricular diastolic Doppler parameters are consistent with impaired relaxation.  The right ventricle has normal systolic function. The cavity was normal. There is no increase in right ventricular wall thickness. Right ventricular systolic pressure could not be assessed.  The mitral valve is grossly normal.  The tricuspid valve is grossly normal.  The aortic valve is grossly normal. No stenosis of the aortic valve.  The aorta is normal in size and structure.   LEFT HEART CATHETERIZATION AND CORONARY ANGIOGRAPHY performed on 08/18/2017 Normal left ventricular systolic function Normal LVEDP Multivessel CAD 40% ostial to proximal LAD 30% proximal LAD 60% OM1 60% proximal to mid RCA 30% mid to  distal RCA Patent stents in the LAD and RCA with no signs of critical disease Recommendations: continue medical management   Impression and Plan:  Dorothy Rogers has been referred for pre-anesthesia review and clearance prior to her undergoing the planned anesthetic and procedural courses. Available labs, pertinent testing, and imaging results were personally reviewed by me in preparation for upcoming operative/procedural course. [facility] medical record has been updated following extensive record review and patient interview with PAT staff.   This patient has been appropriately cleared by cardiology with an overall ACCEPTABLE risk of experiencing significant perioperative cardiovascular complications. Clearance from pulmonary medicine is pending. I will plan on forwarding to SDS for review by both surgical and anesthesia teams once received. Notes from pulmonology indicate that MD is aware of surgery and intended on providing clearance documentation, which may have been forwarded to the surgeon's office. PAT secretary has left a message requesting a return call to discuss. Clearance to proceed  from pulmonology appeared to be contingent on clearance from cardiology. Pulmonology recommending use of inceptive spirometer to maintain lung function in the patient's day to day life. Early use postoperatively will be especially important for this patient.   Based on clinical review performed today (07/27/23), barring any significant acute changes in the patient's overall condition, it is anticipated that she will be able to proceed with the planned surgical intervention. Any acute changes in clinical condition may necessitate her procedure being postponed and/or cancelled. Patient will meet with anesthesia team (MD and/or CRNA) on the day of her procedure for preoperative evaluation/assessment. Questions regarding anesthetic course will be fielded at that time.   Pre-surgical instructions were reviewed with the patient during his PAT appointment, and questions were fielded to satisfaction by PAT clinical staff. She has been instructed on which medications that she will need to hold prior to surgery, as well as the ones that have been deemed safe/appropriate to take on the day of his procedure. As part of the general education provided by PAT, patient made aware both verbally and in writing, that she would need to abstain from the use of any illegal substances during his perioperative course. She was advised that failure to follow the provided instructions could necessitate case cancellation or result in serious perioperative complications up to and including death. Patient encouraged to contact PAT and/or her surgeon's office to discuss any questions or concerns that may arise prior to surgery; verbalized understanding.   Quentin Mulling, MSN, APRN, FNP-C, CEN [facility]  Perioperative Services Nurse Practitioner Phone: [phone] Fax: [phone] 07/27/23 13:45 PM  NOTE: This note has been prepared using Dragon dictation software. Despite my best ability to proofread, there is  always the potential that unintentional transcriptional errors may still occur from this process.

## 2023-07-26 NOTE — Pre-Procedure Instructions (Signed)
 Patient called to notify staff of issues with periodic bowel incontinence. She will let pre op know on day of surgery. Msg sent to [facility].

## 2023-07-27 ENCOUNTER — Encounter: Payer: Self-pay | Admitting: Orthopedic Surgery

## 2023-07-27 MED ORDER — ORAL CARE MOUTH RINSE: 15.0000 mL | Freq: Once | OROMUCOSAL | Status: AC

## 2023-07-27 MED ORDER — DEXAMETHASONE SODIUM PHOSPHATE 10 MG/ML IJ SOLN
8.0000 mg | Freq: Once | INTRAMUSCULAR | Status: AC
  Administered 2023-07-28: 8 mg via INTRAVENOUS

## 2023-07-27 MED ORDER — CEFAZOLIN SODIUM-DEXTROSE 2-4 GM/100ML-% IV SOLN
2.0000 g | INTRAVENOUS | Status: AC
  Administered 2023-07-28: 2 g via INTRAVENOUS

## 2023-07-27 MED ORDER — SODIUM CHLORIDE 0.9 % IV SOLN: INTRAVENOUS | Status: DC

## 2023-07-27 MED ORDER — TRANEXAMIC ACID-NACL 1000-0.7 MG/100ML-% IV SOLN
1000.0000 mg | INTRAVENOUS | Status: AC
  Administered 2023-07-28 (×2): 1000 mg via INTRAVENOUS

## 2023-07-27 MED ORDER — CHLORHEXIDINE GLUCONATE 0.12 % MT SOLN
15.0000 mL | Freq: Once | OROMUCOSAL | Status: AC
  Administered 2023-07-28: 15 mL via OROMUCOSAL

## 2023-07-28 ENCOUNTER — Ambulatory Visit: Payer: Medicare Other | Admitting: Urgent Care

## 2023-07-28 ENCOUNTER — Ambulatory Visit: Payer: Medicare Other

## 2023-07-28 ENCOUNTER — Encounter: Payer: Self-pay | Admitting: Orthopedic Surgery

## 2023-07-28 ENCOUNTER — Ambulatory Visit
Admission: RE | Admit: 2023-07-28 | Discharge: 2023-07-29 | Disposition: A | Discharge status: Home Health | Payer: Medicare Other | Attending: Orthopedic Surgery | Admitting: Orthopedic Surgery

## 2023-07-28 ENCOUNTER — Other Ambulatory Visit: Payer: Self-pay

## 2023-07-28 ENCOUNTER — Encounter
Admission: RE | Disposition: A | Discharge status: Home Health | Payer: Self-pay | Source: Home / Self Care | Attending: Orthopedic Surgery

## 2023-07-28 DIAGNOSIS — D62 Acute posthemorrhagic anemia: Secondary | ICD-10-CM | POA: Diagnosis not present

## 2023-07-28 DIAGNOSIS — Z01818 Encounter for other preprocedural examination: Secondary | ICD-10-CM

## 2023-07-28 DIAGNOSIS — E1151 Type 2 diabetes mellitus with diabetic peripheral angiopathy without gangrene: Secondary | ICD-10-CM | POA: Diagnosis not present

## 2023-07-28 DIAGNOSIS — E1122 Type 2 diabetes mellitus with diabetic chronic kidney disease: Secondary | ICD-10-CM | POA: Insufficient documentation

## 2023-07-28 DIAGNOSIS — Z7982 Long term (current) use of aspirin: Secondary | ICD-10-CM | POA: Insufficient documentation

## 2023-07-28 DIAGNOSIS — Z7902 Long term (current) use of antithrombotics/antiplatelets: Secondary | ICD-10-CM | POA: Insufficient documentation

## 2023-07-28 DIAGNOSIS — I272 Pulmonary hypertension, unspecified: Secondary | ICD-10-CM | POA: Diagnosis not present

## 2023-07-28 DIAGNOSIS — I129 Hypertensive chronic kidney disease with stage 1 through stage 4 chronic kidney disease, or unspecified chronic kidney disease: Secondary | ICD-10-CM | POA: Insufficient documentation

## 2023-07-28 DIAGNOSIS — Z96642 Presence of left artificial hip joint: Secondary | ICD-10-CM | POA: Diagnosis present

## 2023-07-28 DIAGNOSIS — K449 Diaphragmatic hernia without obstruction or gangrene: Secondary | ICD-10-CM | POA: Diagnosis not present

## 2023-07-28 DIAGNOSIS — M545 Low back pain, unspecified: Secondary | ICD-10-CM | POA: Insufficient documentation

## 2023-07-28 DIAGNOSIS — K219 Gastro-esophageal reflux disease without esophagitis: Secondary | ICD-10-CM | POA: Diagnosis not present

## 2023-07-28 DIAGNOSIS — Z955 Presence of coronary angioplasty implant and graft: Secondary | ICD-10-CM | POA: Insufficient documentation

## 2023-07-28 DIAGNOSIS — G4733 Obstructive sleep apnea (adult) (pediatric): Secondary | ICD-10-CM | POA: Diagnosis not present

## 2023-07-28 DIAGNOSIS — E039 Hypothyroidism, unspecified: Secondary | ICD-10-CM | POA: Diagnosis not present

## 2023-07-28 DIAGNOSIS — E1165 Type 2 diabetes mellitus with hyperglycemia: Secondary | ICD-10-CM | POA: Diagnosis not present

## 2023-07-28 DIAGNOSIS — N183 Chronic kidney disease, stage 3 unspecified: Secondary | ICD-10-CM | POA: Diagnosis not present

## 2023-07-28 DIAGNOSIS — Z7984 Long term (current) use of oral hypoglycemic drugs: Secondary | ICD-10-CM | POA: Insufficient documentation

## 2023-07-28 DIAGNOSIS — Z794 Long term (current) use of insulin: Secondary | ICD-10-CM | POA: Insufficient documentation

## 2023-07-28 DIAGNOSIS — Z01812 Encounter for preprocedural laboratory examination: Secondary | ICD-10-CM

## 2023-07-28 DIAGNOSIS — E1140 Type 2 diabetes mellitus with diabetic neuropathy, unspecified: Secondary | ICD-10-CM | POA: Diagnosis not present

## 2023-07-28 DIAGNOSIS — F419 Anxiety disorder, unspecified: Secondary | ICD-10-CM | POA: Diagnosis not present

## 2023-07-28 DIAGNOSIS — R8290 Unspecified abnormal findings in urine: Secondary | ICD-10-CM

## 2023-07-28 DIAGNOSIS — G8929 Other chronic pain: Secondary | ICD-10-CM | POA: Insufficient documentation

## 2023-07-28 DIAGNOSIS — R8271 Bacteriuria: Secondary | ICD-10-CM

## 2023-07-28 DIAGNOSIS — Z91048 Other nonmedicinal substance allergy status: Secondary | ICD-10-CM | POA: Insufficient documentation

## 2023-07-28 DIAGNOSIS — D631 Anemia in chronic kidney disease: Secondary | ICD-10-CM | POA: Insufficient documentation

## 2023-07-28 DIAGNOSIS — I700 Atherosclerosis of aorta: Secondary | ICD-10-CM | POA: Diagnosis not present

## 2023-07-28 DIAGNOSIS — Z79899 Other long term (current) drug therapy: Secondary | ICD-10-CM | POA: Insufficient documentation

## 2023-07-28 DIAGNOSIS — I2510 Atherosclerotic heart disease of native coronary artery without angina pectoris: Secondary | ICD-10-CM | POA: Diagnosis not present

## 2023-07-28 DIAGNOSIS — E1143 Type 2 diabetes mellitus with diabetic autonomic (poly)neuropathy: Secondary | ICD-10-CM | POA: Insufficient documentation

## 2023-07-28 DIAGNOSIS — K3184 Gastroparesis: Secondary | ICD-10-CM | POA: Diagnosis not present

## 2023-07-28 DIAGNOSIS — E785 Hyperlipidemia, unspecified: Secondary | ICD-10-CM | POA: Diagnosis not present

## 2023-07-28 DIAGNOSIS — Z981 Arthrodesis status: Secondary | ICD-10-CM | POA: Insufficient documentation

## 2023-07-28 DIAGNOSIS — M160 Bilateral primary osteoarthritis of hip: Secondary | ICD-10-CM | POA: Insufficient documentation

## 2023-07-28 HISTORY — DX: Other chronic pain: G89.29

## 2023-07-28 HISTORY — DX: Cervicalgia: M54.2

## 2023-07-28 HISTORY — DX: Scoliosis, unspecified: M41.9

## 2023-07-28 HISTORY — DX: Other ill-defined heart diseases: I51.89

## 2023-07-28 HISTORY — DX: Cataract extraction status, left eye: Z98.41

## 2023-07-28 HISTORY — DX: Chronic kidney disease, stage 3 unspecified: N18.30

## 2023-07-28 HISTORY — DX: Long term (current) use of aspirin: Z79.82

## 2023-07-28 HISTORY — DX: Long term (current) use of anticoagulants: Z79.01

## 2023-07-28 HISTORY — DX: Atherosclerosis of aorta: I70.0

## 2023-07-28 HISTORY — DX: Anxiety disorder, unspecified: F41.9

## 2023-07-28 HISTORY — DX: Cataract extraction status, right eye: Z98.42

## 2023-07-28 HISTORY — DX: Low back pain, unspecified: M54.50

## 2023-07-28 HISTORY — PX: TOTAL HIP ARTHROPLASTY: SHX124

## 2023-07-28 LAB — GLUCOSE, CAPILLARY
Glucose-Capillary: 95 mg/dL (ref 70–99)
Glucose-Capillary: 132 mg/dL — ABNORMAL HIGH (ref 70–99)
Glucose-Capillary: 154 mg/dL — ABNORMAL HIGH (ref 70–99)
Glucose-Capillary: 185 mg/dL — ABNORMAL HIGH (ref 70–99)
Glucose-Capillary: 395 mg/dL — ABNORMAL HIGH (ref 70–99)

## 2023-07-28 LAB — ABO/RH: ABO/RH(D): A NEG

## 2023-07-28 SURGERY — ARTHROPLASTY, HIP, TOTAL, ANTERIOR APPROACH
Anesthesia: General | Site: Hip | Laterality: Left

## 2023-07-28 MED ORDER — BUPIVACAINE-EPINEPHRINE (PF) 0.25% -1:200000 IJ SOLN
INTRAMUSCULAR | Status: AC
  Filled 2023-07-28: qty 90

## 2023-07-28 MED ORDER — STERILE WATER FOR IRRIGATION IR SOLN
Status: DC | PRN
  Administered 2023-07-28: 1000 mL

## 2023-07-28 MED ORDER — PHENYLEPHRINE HCL-NACL 20-0.9 MG/250ML-% IV SOLN
INTRAVENOUS | Status: DC | PRN
  Administered 2023-07-28: 10 ug/min via INTRAVENOUS

## 2023-07-28 MED ORDER — ATORVASTATIN CALCIUM 10 MG PO TABS
40.0000 mg | ORAL_TABLET | Freq: Every day | ORAL | Status: DC
  Administered 2023-07-28: 40 mg via ORAL
  Filled 2023-07-28: qty 4

## 2023-07-28 MED ORDER — SODIUM CHLORIDE (PF) 0.9 % IJ SOLN
INTRAMUSCULAR | Status: DC | PRN
  Administered 2023-07-28: 50 mL via INTRAMUSCULAR

## 2023-07-28 MED ORDER — ACETAMINOPHEN 10 MG/ML IV SOLN
INTRAVENOUS | Status: AC
  Filled 2023-07-28: qty 100

## 2023-07-28 MED ORDER — FENTANYL CITRATE (PF) 100 MCG/2ML IJ SOLN: 25.0000 ug | INTRAMUSCULAR | Status: DC | PRN

## 2023-07-28 MED ORDER — MENTHOL 3 MG MT LOZG: 1.0000 | LOZENGE | OROMUCOSAL | Status: DC | PRN

## 2023-07-28 MED ORDER — ASPIRIN 81 MG PO CHEW
81.0000 mg | CHEWABLE_TABLET | Freq: Two times a day (BID) | ORAL | Status: DC
  Administered 2023-07-28 – 2023-07-29 (×2): 81 mg via ORAL
  Filled 2023-07-28 (×2): qty 1

## 2023-07-28 MED ORDER — AMITRIPTYLINE HCL 10 MG PO TABS
10.0000 mg | ORAL_TABLET | Freq: Every day | ORAL | Status: DC
  Administered 2023-07-28: 10 mg via ORAL
  Filled 2023-07-28: qty 1

## 2023-07-28 MED ORDER — SODIUM CHLORIDE (PF) 0.9 % IJ SOLN
INTRAMUSCULAR | Status: AC
  Filled 2023-07-28: qty 30

## 2023-07-28 MED ORDER — ROCURONIUM BROMIDE 100 MG/10ML IV SOLN
INTRAVENOUS | Status: DC | PRN
  Administered 2023-07-28: 50 mg via INTRAVENOUS

## 2023-07-28 MED ORDER — LORAZEPAM 1 MG PO TABS: 1.0000 mg | ORAL_TABLET | Freq: Three times a day (TID) | ORAL | Status: DC | PRN

## 2023-07-28 MED ORDER — DOCUSATE SODIUM 100 MG PO CAPS
100.0000 mg | ORAL_CAPSULE | Freq: Two times a day (BID) | ORAL | Status: DC
  Administered 2023-07-28 – 2023-07-29 (×2): 100 mg via ORAL
  Filled 2023-07-28 (×2): qty 1

## 2023-07-28 MED ORDER — LEVOTHYROXINE SODIUM 25 MCG PO TABS
50.0000 ug | ORAL_TABLET | Freq: Every day | ORAL | Status: DC
  Administered 2023-07-29: 50 ug via ORAL
  Filled 2023-07-28: qty 2

## 2023-07-28 MED ORDER — MORPHINE SULFATE (PF) 2 MG/ML IV SOLN
0.5000 mg | INTRAVENOUS | Status: DC | PRN
Start: 2023-07-28 — End: 2023-07-29

## 2023-07-28 MED ORDER — SODIUM CHLORIDE 0.9 % IR SOLN
Status: DC | PRN
  Administered 2023-07-28: 100 mL

## 2023-07-28 MED ORDER — MIDAZOLAM HCL 2 MG/2ML IJ SOLN
INTRAMUSCULAR | Status: DC | PRN
Start: 2023-07-28 — End: 2023-07-28
  Administered 2023-07-28: 2 mg via INTRAVENOUS

## 2023-07-28 MED ORDER — KETOROLAC TROMETHAMINE 15 MG/ML IJ SOLN
INTRAMUSCULAR | Status: AC
  Filled 2023-07-28: qty 1

## 2023-07-28 MED ORDER — OXYCODONE HCL 5 MG PO TABS
2.5000 mg | ORAL_TABLET | ORAL | Status: DC | PRN
  Administered 2023-07-29: 2.5 mg via ORAL

## 2023-07-28 MED ORDER — DEXMEDETOMIDINE HCL IN NACL 80 MCG/20ML IV SOLN
INTRAVENOUS | Status: DC | PRN
  Administered 2023-07-28 (×2): 8 ug via INTRAVENOUS

## 2023-07-28 MED ORDER — PHENOL 1.4 % MT LIQD: 1.0000 | OROMUCOSAL | Status: DC | PRN

## 2023-07-28 MED ORDER — LIDOCAINE HCL (CARDIAC) PF 100 MG/5ML IV SOSY
PREFILLED_SYRINGE | INTRAVENOUS | Status: DC | PRN
  Administered 2023-07-28: 100 mg via INTRAVENOUS

## 2023-07-28 MED ORDER — BUPIVACAINE LIPOSOME 1.3 % IJ SUSP
INTRAMUSCULAR | Status: AC
  Filled 2023-07-28: qty 60

## 2023-07-28 MED ORDER — PROPOFOL 10 MG/ML IV BOLUS
INTRAVENOUS | Status: DC | PRN
  Administered 2023-07-28: 100 mg via INTRAVENOUS

## 2023-07-28 MED ORDER — CHLORHEXIDINE GLUCONATE 0.12 % MT SOLN
OROMUCOSAL | Status: AC
  Filled 2023-07-28: qty 15

## 2023-07-28 MED ORDER — SURGIPHOR WOUND IRRIGATION SYSTEM - OPTIME: TOPICAL | Status: DC | PRN

## 2023-07-28 MED ORDER — FENTANYL CITRATE (PF) 100 MCG/2ML IJ SOLN
INTRAMUSCULAR | Status: DC | PRN
  Administered 2023-07-28 (×2): 50 ug via INTRAVENOUS

## 2023-07-28 MED ORDER — METOCLOPRAMIDE HCL 10 MG PO TABS: 5.0000 mg | ORAL_TABLET | Freq: Three times a day (TID) | ORAL | Status: DC | PRN

## 2023-07-28 MED ORDER — INSULIN ASPART 100 UNIT/ML IJ SOLN
INTRAMUSCULAR | Status: AC
  Filled 2023-07-28: qty 1

## 2023-07-28 MED ORDER — ACETAMINOPHEN 10 MG/ML IV SOLN: INTRAVENOUS | Status: DC | PRN

## 2023-07-28 MED ORDER — FENTANYL CITRATE (PF) 100 MCG/2ML IJ SOLN
INTRAMUSCULAR | Status: AC
Start: 2023-07-28 — End: ?
  Filled 2023-07-28: qty 2

## 2023-07-28 MED ORDER — KETAMINE HCL 50 MG/5ML IJ SOSY
PREFILLED_SYRINGE | INTRAMUSCULAR | Status: DC | PRN
  Administered 2023-07-28 (×3): 10 mg via INTRAVENOUS
  Administered 2023-07-28: 20 mg via INTRAVENOUS

## 2023-07-28 MED ORDER — ONDANSETRON HCL 4 MG PO TABS: 4.0000 mg | ORAL_TABLET | Freq: Four times a day (QID) | ORAL | Status: DC | PRN

## 2023-07-28 MED ORDER — ORAL CARE MOUTH RINSE: 15.0000 mL | OROMUCOSAL | Status: DC | PRN

## 2023-07-28 MED ORDER — OXYCODONE HCL 5 MG PO TABS: 5.0000 mg | ORAL_TABLET | ORAL | Status: DC | PRN

## 2023-07-28 MED ORDER — TRANEXAMIC ACID-NACL 1000-0.7 MG/100ML-% IV SOLN
INTRAVENOUS | Status: AC
  Filled 2023-07-28: qty 100

## 2023-07-28 MED ORDER — ONDANSETRON HCL 4 MG/2ML IJ SOLN
INTRAMUSCULAR | Status: DC | PRN
  Administered 2023-07-28: 4 mg via INTRAVENOUS

## 2023-07-28 MED ORDER — FERROUS SULFATE 325 (65 FE) MG PO TABS
325.0000 mg | ORAL_TABLET | Freq: Every day | ORAL | Status: DC
  Administered 2023-07-29: 325 mg via ORAL
  Filled 2023-07-28: qty 1

## 2023-07-28 MED ORDER — INSULIN GLARGINE 100 UNIT/ML ~~LOC~~ SOLN
12.0000 [IU] | Freq: Every day | SUBCUTANEOUS | Status: DC
Start: 2023-07-28 — End: 2023-07-29
  Administered 2023-07-28 – 2023-07-29 (×2): 12 [IU] via SUBCUTANEOUS
  Filled 2023-07-28 (×2): qty 0.12

## 2023-07-28 MED ORDER — TETRACAINE HCL 0.5 % OP SOLN
1.0000 [drp] | Freq: Once | OPHTHALMIC | Status: AC
  Administered 2023-07-28: 1 [drp] via OPHTHALMIC
  Filled 2023-07-28: qty 4

## 2023-07-28 MED ORDER — KETOROLAC TROMETHAMINE 15 MG/ML IJ SOLN
7.5000 mg | Freq: Four times a day (QID) | INTRAMUSCULAR | Status: AC
  Administered 2023-07-28 – 2023-07-29 (×4): 7.5 mg via INTRAVENOUS
  Filled 2023-07-28 (×3): qty 1

## 2023-07-28 MED ORDER — DILTIAZEM HCL ER COATED BEADS 180 MG PO CP24
180.0000 mg | ORAL_CAPSULE | Freq: Every day | ORAL | Status: DC
  Administered 2023-07-29: 180 mg via ORAL

## 2023-07-28 MED ORDER — LOSARTAN POTASSIUM 50 MG PO TABS
100.0000 mg | ORAL_TABLET | Freq: Every day | ORAL | Status: DC
  Administered 2023-07-29: 100 mg via ORAL

## 2023-07-28 MED ORDER — LIDOCAINE HCL URETHRAL/MUCOSAL 2 % EX GEL
CUTANEOUS | Status: DC | PRN
  Administered 2023-07-28: 1 via TOPICAL

## 2023-07-28 MED ORDER — METOCLOPRAMIDE HCL 5 MG/ML IJ SOLN: 5.0000 mg | Freq: Three times a day (TID) | INTRAMUSCULAR | Status: DC | PRN

## 2023-07-28 MED ORDER — CLOPIDOGREL BISULFATE 75 MG PO TABS
75.0000 mg | ORAL_TABLET | Freq: Every day | ORAL | Status: DC
  Administered 2023-07-29: 75 mg via ORAL

## 2023-07-28 MED ORDER — KETOROLAC TROMETHAMINE 0.5 % OP SOLN
1.0000 [drp] | Freq: Four times a day (QID) | OPHTHALMIC | Status: DC
Start: 2023-07-28 — End: 2023-07-29
  Administered 2023-07-28 – 2023-07-29 (×4): 1 [drp] via OPHTHALMIC
  Filled 2023-07-28: qty 3

## 2023-07-28 MED ORDER — POLYMYXIN B-TRIMETHOPRIM 10000-0.1 UNIT/ML-% OP SOLN
1.0000 [drp] | Freq: Four times a day (QID) | OPHTHALMIC | Status: DC
  Administered 2023-07-28 – 2023-07-29 (×4): 1 [drp] via OPHTHALMIC
  Filled 2023-07-28: qty 10

## 2023-07-28 MED ORDER — MIDAZOLAM HCL 2 MG/2ML IJ SOLN
INTRAMUSCULAR | Status: AC
Start: 2023-07-28 — End: ?
  Filled 2023-07-28: qty 2

## 2023-07-28 MED ORDER — INSULIN ASPART 100 UNIT/ML IJ SOLN
0.0000 [IU] | Freq: Every day | INTRAMUSCULAR | Status: DC
  Administered 2023-07-28: 5 [IU] via SUBCUTANEOUS

## 2023-07-28 MED ORDER — POLYVINYL ALCOHOL 1.4 % OP SOLN
1.0000 [drp] | OPHTHALMIC | Status: DC | PRN
  Administered 2023-07-28: 1 [drp] via OPHTHALMIC
  Filled 2023-07-28: qty 15

## 2023-07-28 MED ORDER — CEFAZOLIN SODIUM-DEXTROSE 2-4 GM/100ML-% IV SOLN
INTRAVENOUS | Status: AC
  Filled 2023-07-28: qty 100

## 2023-07-28 MED ORDER — ACETAMINOPHEN 10 MG/ML IV SOLN
INTRAVENOUS | Status: DC | PRN
  Administered 2023-07-28: 1000 mg via INTRAVENOUS

## 2023-07-28 MED ORDER — SODIUM CHLORIDE 0.9 % IV SOLN: INTRAVENOUS | Status: DC

## 2023-07-28 MED ORDER — ONDANSETRON HCL 4 MG/2ML IJ SOLN
4.0000 mg | Freq: Four times a day (QID) | INTRAMUSCULAR | Status: DC | PRN
  Administered 2023-07-29: 4 mg via INTRAVENOUS
  Filled 2023-07-28: qty 2

## 2023-07-28 MED ORDER — EZETIMIBE 10 MG PO TABS
10.0000 mg | ORAL_TABLET | Freq: Every day | ORAL | Status: DC
Start: 2023-07-28 — End: 2023-07-29
  Administered 2023-07-28: 10 mg via ORAL
  Filled 2023-07-28: qty 1

## 2023-07-28 MED ORDER — KETAMINE HCL 50 MG/5ML IJ SOSY
PREFILLED_SYRINGE | INTRAMUSCULAR | Status: AC
  Filled 2023-07-28: qty 5

## 2023-07-28 MED ORDER — ACETAMINOPHEN 500 MG PO TABS
1000.0000 mg | ORAL_TABLET | Freq: Three times a day (TID) | ORAL | Status: DC
  Administered 2023-07-28 – 2023-07-29 (×3): 1000 mg via ORAL
  Filled 2023-07-28 (×3): qty 2

## 2023-07-28 MED ORDER — PROPOFOL 1000 MG/100ML IV EMUL
INTRAVENOUS | Status: AC
  Filled 2023-07-28: qty 100

## 2023-07-28 MED ORDER — PHENYLEPHRINE HCL-NACL 20-0.9 MG/250ML-% IV SOLN
INTRAVENOUS | Status: AC
  Filled 2023-07-28: qty 250

## 2023-07-28 MED ORDER — BSS IO SOLN
15.0000 mL | Freq: Once | INTRAOCULAR | Status: AC
  Administered 2023-07-28: 15 mL
  Filled 2023-07-28: qty 15

## 2023-07-28 MED ORDER — VASOPRESSIN 20 UNIT/ML IV SOLN
INTRAVENOUS | Status: DC | PRN
  Administered 2023-07-28: 1 [IU] via INTRAVENOUS

## 2023-07-28 MED ORDER — INSULIN ASPART 100 UNIT/ML IJ SOLN
0.0000 [IU] | Freq: Three times a day (TID) | INTRAMUSCULAR | Status: DC
  Administered 2023-07-28 (×2): 3 [IU] via SUBCUTANEOUS
  Administered 2023-07-29: 15 [IU] via SUBCUTANEOUS
  Administered 2023-07-29: 11 [IU] via SUBCUTANEOUS
  Filled 2023-07-28 (×3): qty 1

## 2023-07-28 MED ORDER — ACETAMINOPHEN 325 MG PO TABS: 325.0000 mg | ORAL_TABLET | Freq: Four times a day (QID) | ORAL | Status: DC | PRN

## 2023-07-28 MED ORDER — PANTOPRAZOLE SODIUM 40 MG PO TBEC
40.0000 mg | DELAYED_RELEASE_TABLET | Freq: Every day | ORAL | Status: DC
  Administered 2023-07-29: 40 mg via ORAL

## 2023-07-28 MED ORDER — TRANEXAMIC ACID-NACL 1000-0.7 MG/100ML-% IV SOLN
INTRAVENOUS | Status: AC
Start: 2023-07-28 — End: ?
  Filled 2023-07-28: qty 100

## 2023-07-28 MED ORDER — CEFAZOLIN SODIUM-DEXTROSE 2-4 GM/100ML-% IV SOLN
2.0000 g | Freq: Four times a day (QID) | INTRAVENOUS | Status: AC
  Administered 2023-07-28 (×2): 2 g via INTRAVENOUS
  Filled 2023-07-28 (×2): qty 100

## 2023-07-28 MED ORDER — 0.9 % SODIUM CHLORIDE (POUR BTL) OPTIME
TOPICAL | Status: DC | PRN
  Administered 2023-07-28: 500 mL

## 2023-07-28 SURGICAL SUPPLY — 63 items
BLADE CLIPPER SURG (BLADE) IMPLANT
BLADE SAGITTAL AGGR TOOTH XLG (BLADE) ×1 IMPLANT
BNDG COHESIVE 6X5 TAN ST LF (GAUZE/BANDAGES/DRESSINGS) ×1 IMPLANT
BRUSH SCRUB EZ PLAIN DRY (MISCELLANEOUS) ×1 IMPLANT
CHLORAPREP W/TINT 26 (MISCELLANEOUS) ×1 IMPLANT
DERMABOND ADVANCED .7 DNX12 (GAUZE/BANDAGES/DRESSINGS) ×1 IMPLANT
DRAPE C-ARM XRAY 36X54 (DRAPES) ×1 IMPLANT
DRAPE SHEET LG 3/4 BI-LAMINATE (DRAPES) ×3 IMPLANT
DRAPE TABLE BACK 80X90 (DRAPES) ×1 IMPLANT
DRSG MEPILEX SACRM 8.7X9.8 (GAUZE/BANDAGES/DRESSINGS) ×1 IMPLANT
DRSG OPSITE POSTOP 4X8 (GAUZE/BANDAGES/DRESSINGS) ×1 IMPLANT
ELECT BLADE 4.0 EZ CLEAN MEGAD (MISCELLANEOUS) ×1 IMPLANT
ELECT REM PT RETURN 9FT ADLT (ELECTROSURGICAL) ×1 IMPLANT
ELECTRODE BLDE 4.0 EZ CLN MEGD (MISCELLANEOUS) ×1 IMPLANT
ELECTRODE REM PT RTRN 9FT ADLT (ELECTROSURGICAL) ×1 IMPLANT
GLOVE BIO SURGEON STRL SZ8 (GLOVE) ×1 IMPLANT
GLOVE BIOGEL PI IND STRL 8 (GLOVE) ×1 IMPLANT
GLOVE PI ORTHO PRO STRL 7.5 (GLOVE) ×2 IMPLANT
GLOVE PI ORTHO PRO STRL SZ8 (GLOVE) ×2 IMPLANT
GLOVE SURG SYN 7.5 E (GLOVE) ×1 IMPLANT
GLOVE SURG SYN 7.5 PF PI (GLOVE) ×1 IMPLANT
GOWN SRG XL LVL 3 NONREINFORCE (GOWNS) ×1 IMPLANT
GOWN STRL REUS W/ TWL LRG LVL3 (GOWN DISPOSABLE) ×1 IMPLANT
GOWN STRL REUS W/ TWL XL LVL3 (GOWN DISPOSABLE) ×1 IMPLANT
HEAD BIOLOX HIP 36/-2.5 (Joint) IMPLANT
HIP BIOLOX HD 36/-2.5 (Joint) ×1 IMPLANT
HOOD PEEL AWAY T7 (MISCELLANEOUS) ×2 IMPLANT
INSERT 0 DEGREE 36 (Miscellaneous) IMPLANT
IV NS 100ML SINGLE PACK (IV SOLUTION) ×1 IMPLANT
KIT PATIENT CARE HANA TABLE (KITS) ×1 IMPLANT
KIT TURNOVER CYSTO (KITS) ×1 IMPLANT
LIGHT WAVEGUIDE WIDE FLAT (MISCELLANEOUS) ×1 IMPLANT
MANIFOLD NEPTUNE II (INSTRUMENTS) ×1 IMPLANT
MARKER SKIN DUAL TIP RULER LAB (MISCELLANEOUS) ×1 IMPLANT
MAT ABSORB FLUID 56X50 GRAY (MISCELLANEOUS) ×1 IMPLANT
NDL SPNL 20GX3.5 QUINCKE YW (NEEDLE) ×1 IMPLANT
NEEDLE SPNL 20GX3.5 QUINCKE YW (NEEDLE) ×1 IMPLANT
NS IRRIG 500ML POUR BTL (IV SOLUTION) ×1 IMPLANT
PACK HIP COMPR (MISCELLANEOUS) ×1 IMPLANT
PAD ARMBOARD 7.5X6 YLW CONV (MISCELLANEOUS) ×1 IMPLANT
PENCIL SMOKE EVACUATOR (MISCELLANEOUS) ×1 IMPLANT
SCREW HEX LP 6.5X20 (Screw) IMPLANT
SCREW HEX LP 6.5X30 (Screw) IMPLANT
SHELL ACETAB TRIDENT 48 (Shell) IMPLANT
SLEEVE SCD COMPRESS KNEE MED (STOCKING) ×1 IMPLANT
SOLUTION IRRIG SURGIPHOR (IV SOLUTION) ×1 IMPLANT
STEM STD OFFSET SZ4 32.5 (Stem) IMPLANT
SURGIFLO W/THROMBIN 8M KIT (HEMOSTASIS) IMPLANT
SUT BONE WAX W31G (SUTURE) ×1 IMPLANT
SUT ETHIBOND 2 V 37 (SUTURE) ×1 IMPLANT
SUT SILK 0 30XBRD TIE 6 (SUTURE) ×1 IMPLANT
SUT STRATA 1 CT-1 DLB (SUTURE) ×1 IMPLANT
SUT STRATAFIX 14 PDO 48 VLT (SUTURE) ×1 IMPLANT
SUT STRATAFIX PDO 1 14 VIOLET (SUTURE) ×1 IMPLANT
SUT VIC AB 0 CT1 36 (SUTURE) ×1 IMPLANT
SUT VIC AB 2-0 CT2 27 (SUTURE) ×1 IMPLANT
SUTURE STRATA SPIR 4-0 18 (SUTURE) ×1 IMPLANT
SYR 30ML LL (SYRINGE) ×2 IMPLANT
TAPE MICROFOAM 4IN (TAPE) IMPLANT
TOWEL OR 17X26 4PK STRL BLUE (TOWEL DISPOSABLE) IMPLANT
TRAP FLUID SMOKE EVACUATOR (MISCELLANEOUS) ×1 IMPLANT
WAND WEREWOLF FASTSEAL 6.0 (MISCELLANEOUS) ×1 IMPLANT
WATER STERILE IRR 1000ML POUR (IV SOLUTION) ×1 IMPLANT

## 2023-07-28 NOTE — Interval H&P Note (Signed)
Patient history and physical updated. Consent reviewed including risks, benefits, and alternatives to surgery. Patient agrees with above plan to proceed with left anterior total hip arthroplasty  

## 2023-07-28 NOTE — Transfer of Care (Signed)
 Immediate Anesthesia Transfer of Care Note  Patient: Dorothy Rogers  Procedure(s) Performed: TOTAL HIP ARTHROPLASTY ANTERIOR APPROACH (Left: Hip)  Patient Location: PACU  Anesthesia Type:General  Level of Consciousness: drowsy and patient cooperative  Airway & Oxygen Therapy: Patient Spontanous Breathing and Patient connected to face mask oxygen  Post-op Assessment: Report given to RN and Post -op Vital signs reviewed and stable  Post vital signs: stable with Nasal trumpet in place, face mask and 5L oxygen attached to wall  Last Vitals:  Vitals Value Taken Time  BP 132/63 07/28/23 0949  Temp    Pulse 85 07/28/23 0952  Resp 15 07/28/23 0952  SpO2 100 % 07/28/23 0952  Vitals shown include unfiled device data.  Last Pain:  Vitals:   07/28/23 0618  TempSrc: Temporal  PainSc: 0-No pain         Complications: No notable events documented.

## 2023-07-28 NOTE — Plan of Care (Signed)
  Problem: Pain Managment: Goal: General experience of comfort will improve and/or be controlled Outcome: Progressing   Problem: Safety: Goal: Ability to remain free from injury will improve Outcome: Progressing

## 2023-07-28 NOTE — Plan of Care (Signed)

## 2023-07-28 NOTE — Anesthesia Preprocedure Evaluation (Signed)
 Anesthesia Evaluation  Patient identified by MRN, date of birth, ID band Patient awake    Reviewed: Allergy & Precautions, NPO status , Patient's Chart, lab work & pertinent test results  Airway Mallampati: III  TM Distance: <3 FB Neck ROM: full    Dental  (+) Chipped   Pulmonary asthma , sleep apnea    Pulmonary exam normal        Cardiovascular hypertension, + CAD and + Cardiac Stents  Normal cardiovascular exam     Neuro/Psych  Neuromuscular disease  negative psych ROS   GI/Hepatic Neg liver ROS, hiatal hernia,GERD  Controlled,,  Endo/Other  diabetes, Type 2Hypothyroidism    Renal/GU Renal disease     Musculoskeletal   Abdominal   Peds  Hematology negative hematology ROS (+)   Anesthesia Other Findings Past Medical History: No date: Anemia No date: Anxiety     Comment:  a.) on BZO PRN (lorazepam) No date: Aortic atherosclerosis (HCC) No date: Arthritis     Comment:  lower back, hips No date: Asthma No date: Blood transfusion without reported diagnosis No date: CAD (coronary artery disease)     Comment:  a.) PCI 07/2007 - stents x 2 (unknown type); b.) PCI               03/2008 - stents x 4 (unknown type); c.) cPET 09/16/2017:              no LGE; no ischemia; d.) LHC 10/15/2015: 50-60% mRCA,               diffuse mild LAD disease. 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     Comment:  a.) following repair of hiatal hernia; b.) confirmed by               sniff test in 2019; c.) has been seen by a specialist in               [place] and advised not a surgical candidate No date: Diastolic dysfunction     Comment:  a.) TTE 09/17/2015: EF 65-70%, no RWMAs, G1DD, triv               TR/PR; b.) TTE 02/10/2016: EF >55%, no RWMAs, G1DD, triv               AR/MR, mild TR/PR; c.) TTE 01/05/2019: EF 60-65%, mild               LVH, no RWMAs, G1DD; d.) TTE 08/18/2021: EF >55%, no               RWMAs, G1DD, triv PR, mild MR/TR; e.) TTE 06/22/2023: EF               >  55%, no RWMAs, G1DD, triv AR/TR/PR, mild MR No date: GERD (gastroesophageal reflux disease) 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 2009: History of coronary angioplasty with insertion of stent     Comment:  a.) Total of 6 coronary stents as of 07/26/2023 No date: History of hiatal hernia No date: History of kidney stones No date: Hyperlipidemia No date: Hypertension No date: Hypothyroidism 06/09/2015: Intractable nausea and vomiting No date: Kyphoscoliosis No date: Long-term use of aspirin therapy 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) No date: Obstructive sleep apnea     Comment:  a.) no nocturnal PAP therapy No date: On long term clopidogrel therapy No date: Peripheral neuropathy No date: Peripheral vascular disease (HCC) 05/10/2019: Pulmonary hypertension (HCC)     Comment:  a.) RHC 05/10/2019: mRA 0, mPA 12, mPWCP 0, AO sat 98,               PA sat 72.2, CO 3.74, CI 2.04 No date: Status post bilateral cataract extraction No date: Thrombocytopenia (HCC) No date: Type 2 diabetes mellitus without complication (HCC) No date: Vertigo No date: Vitamin B 12 deficiency  Past Surgical History: 02/04/2016: 24 HOUR PH STUDY; N/A     Comment:  Procedure: 24 HOUR PH STUDY;  Surgeon: Midge Minium, MD;               Location: [facility] ENDOSCOPY;  Service: Endoscopy;                 Laterality: N/A; 11/14/2013: CATARACT EXTRACTION W/ INTRAOCULAR LENS IMPLANT; Left 03/19/2015: CATARACT EXTRACTION W/PHACO; Right     Comment:  Procedure: CATARACT EXTRACTION PHACO AND INTRAOCULAR               LENS PLACEMENT (IOC);  Surgeon: Lockie Mola, MD;               Location: [facility];  Service: Ophthalmology;                Laterality: Right;  DIABETIC - insulin and oral meds 05/19/2016: CHOLECYSTECTOMY; N/A     Comment:  Procedure: LAPAROSCOPIC CHOLECYSTECTOMY;  Surgeon:               Ricarda Frame, MD;  Location: [facility] ORS;  Service:               General;  Laterality: N/A; 03/2008: CORONARY ANGIOPLASTY WITH STENT PLACEMENT     Comment:  4 stents placed 07/2007: CORONARY ANGIOPLASTY WITH STENT PLACEMENT     Comment:  2 stents placed No date: CYSTECTOMY; Right     Comment:  wrist 02/04/2016: ESOPHAGEAL MANOMETRY; N/A     Comment:  Procedure: ESOPHAGEAL MANOMETRY (EM);  Surgeon: Midge Minium, MD;  Location: [facility] ENDOSCOPY;  Service: Endoscopy;              Laterality: N/A; 09/04/2015: ESOPHAGOGASTRODUODENOSCOPY (EGD) WITH PROPOFOL; N/A     Comment:  Procedure: ESOPHAGOGASTRODUODENOSCOPY (EGD) WITH               PROPOFOL;  Surgeon: Midge Minium, MD;  [facility]: [facility]               SURGERY [facility];  Service: Endoscopy;  Laterality: N/A;                INSULIN DEPENDENT DIABETIC 06/29/2016: ESOPHAGOGASTRODUODENOSCOPY (EGD) WITH PROPOFOL; N/A     Comment:  Procedure: ESOPHAGOGASTRODUODENOSCOPY (EGD) WITH  PROPOFOL;  Surgeon: Wyline Mood, MD;  Location: [facility]               ENDOSCOPY;  Service: Endoscopy;  Laterality: N/A; 01/25/2019: ESOPHAGOGASTRODUODENOSCOPY (EGD) WITH PROPOFOL; N/A     Comment:  Procedure: ESOPHAGOGASTRODUODENOSCOPY (EGD) WITH               PROPOFOL;  Surgeon: Wyline Mood, MD;  Location: [facility]               ENDOSCOPY;  Service: Gastroenterology;  Laterality: N/A; 02/2016: HIATAL HERNIA REPAIR     Comment:  Dr  Tonita Cong 08/18/2017: LEFT HEART CATH AND CORONARY ANGIOGRAPHY; Left     Comment:  Procedure: LEFT HEART CATH AND CORONARY ANGIOGRAPHY;                Surgeon: Dalia Heading, MD;  Location: [facility] INVASIVE CV              LAB;  Service: Cardiovascular;  Laterality: Left; 10/15/2015: LEFT HEART CATH AND CORONARY ANGIOGRAPHY; Left 2001: LUMBAR FUSION     Comment:  L1-2 03/27/2013: POSTERIOR LUMBAR FUSION     Comment:  L4-5 05/09/2014: POSTERIOR LUMBAR FUSION     Comment:  L3-4 05/30/2015: POSTERIOR LUMBAR FUSION     Comment:  L2-3 01/21/2017: POSTERIOR LUMBAR FUSION     Comment:  L1-2, 2-3, 3-4 05/10/2019: RIGHT HEART CATH; N/A     Comment:  Procedure: RIGHT HEART CATH;  Surgeon: Dalia Heading,               MD;  Location: [facility] INVASIVE CV LAB;  Service:               Cardiovascular;  Laterality: N/A; 02/2022: SPINAL CORD STIMULATOR IMPLANT 1986: TUMOR EXCISION     Comment:  back of neck malignant fibrous histiocytoma 1992: VAGINAL HYSTERECTOMY     Reproductive/Obstetrics negative OB ROS                             Anesthesia Physical Anesthesia Plan  ASA: 3  Anesthesia Plan: General ETT   Post-op Pain Management:    Induction: Intravenous  PONV Risk Score and Plan: Ondansetron, Dexamethasone, Midazolam and Treatment may vary due to age or medical condition  Airway Management Planned: Oral ETT  Additional Equipment:   Intra-op Plan:   Post-operative Plan: Extubation in OR  Informed Consent: I have reviewed the patients History and Physical, chart, labs and discussed the procedure including the risks, benefits and alternatives for the proposed anesthesia with the patient or authorized representative who has indicated his/her understanding and acceptance.     Dental Advisory Given  Plan Discussed with: Anesthesiologist, CRNA and Surgeon  Anesthesia Plan Comments: (Patient consented for risks of anesthesia including but not limited to:  -  adverse reactions to medications - damage to eyes, teeth, lips or other oral mucosa - nerve damage due to positioning  - sore throat or hoarseness - Damage to heart, brain, nerves, lungs, other parts of body or loss of life  Patient voiced understanding and assent.)       Anesthesia Quick Evaluation

## 2023-07-28 NOTE — Inpatient Diabetes Management (Signed)
 Inpatient Diabetes Program Recommendations  AACE/ADA: New Consensus Statement on Inpatient Glycemic Control (2015)  Target Ranges:  Prepandial:   less than 140 mg/dL      Peak postprandial:   less than 180 mg/dL (1-2 hours)      Critically ill patients:  140 - 180 mg/dL   Lab Results  Component Value Date   GLUCAP 154 (H) 07/28/2023   HGBA1C 6.5 (H) 06/09/2015    Review of Glycemic Control  Latest Reference Range & Units 07/28/23 06:17 07/28/23 09:55 07/28/23 12:12  Glucose-Capillary 70 - 99 mg/dL 95 161 (H) 096 (H)  (H): Data is abnormally high Diabetes history: Type 2 DM Outpatient Diabetes medications: Glipizide 10 mg BID, Lantus 36 units QD Current orders for Inpatient glycemic control: Novolog 0-15 units TID & Hs Decadron 8 mg x 1  Inpatient Diabetes Program Recommendations:    Consider adding Lantus 12 units QHS  Thanks, Lujean Rave, MSN, RNC-OB Diabetes Coordinator [phone] (8a-5p)

## 2023-07-28 NOTE — Op Note (Signed)
 Patient Name: Dorothy Rogers  YQM:[id number]  Pre-Operative Diagnosis: Left hip Osteoarthritis  Post-Operative Diagnosis: (same)  Procedure: Left Total Hip Arthroplasty  Components/Implants: Cup: Trident Tritanium Clusterhole 48/D w/x2 screws    Liner: Neutral X3 poly 36/D  Stem: Insignia #4 std offset  Head:Biolox ceramic 36 -2.55mm  Date of Surgery: 07/28/2023  Surgeon: Reinaldo Berber MD  Assistant: Amador Cunas PA (present and scrubbed throughout the case, critical for assistance with exposure, retraction, instrumentation, and closure), [facility] PAs   Anesthesiologist: Piscitello  Anesthesia: Spinal   EBL: 200cc  IVF:600cc  Complications: None   Brief history: The patient is a 73 year old female with a history of osteoarthritis of the left hip with pain limiting their range of motion and activities of daily living, which has failed multiple attempts at conservative therapy.  The risks and benefits of total hip arthroplasty as definitive surgical treatment were discussed with the patient, who opted to proceed with the operation.  After outpatient medical clearance and optimization was completed the patient was admitted to [facility] for the procedure.  All preoperative films were reviewed and an appropriate surgical plan was made prior to surgery.   Description of procedure: The patient was brought to the operating room where laterality was confirmed by all those present to be the left side.  The patient was administered spinal anesthesia on a stretcher prior to being moved supine on the operating room table. Patient was given an intravenous dose of antibiotics for surgical prophylaxis and TXA.  All bony prominences and extremities were well padded and the patient was securely attached to the table boots, a perineal post was placed and the patient had a safety strap placed.  Surgical site was prepped with alcohol and chlorhexidine. The surgical site over the hip  was and draped in typical sterile fashion with multiple layers of adhesive and nonadhesive drapes.  The incision site was marked out with a sterile marker and care was taken to assess the position of the ASIS and ensure appropriate position for the incision.    A surgical timeout was then called with participation of all staff in the room the patient was then a confirmed again and laterality confirmed.  Incision was made over the anterior lateral aspect of the proximal thigh in line with the TFL.  Appropriate retractors were placed and all bleeding vessels were coagulated within the subcutaneous and fatty layers.  An incision was made in the TFL fascia in the interval was carefully identified.  The lateral ascending branches of the circumflex vessels were identified, cauterized and carefully dissected. The main vessels were then tied with a 0 silk hand tie.  Retractors were placed around the superior lateral and inferior medial aspects of the femoral neck and a capsulotomy was performed exposing the hip joint.  Retraction stitches were placed and the capsulotomy to assist with visualization.  Femoral neck cut was then made and the femoral head was extracted after placing the leg in traction.  Bone wax was then applied to the proximal cut surface of the femur and aqua mantis was used to address any bleeding around the femoral neck cut.  Retractors were then placed around the acetabulum to fully visualize the joint space, and the remaining labral tissue was removed and pulvinar was removed.   The acetabulum was then sequentially reamed up to the appropriate size in order to get good fit and fill for the acetabular component while under fluoroscopic guidance.  Acetabular component was then placed and malleted  into a secure fit while confirming position and abduction angle and anteversion utilizing fluoroscopy.  2 screws were then placed in the acetabular cup to assist in securing the cup in place. The cup was  irrigated,  a real neutral liner was placed, impacted, and checked for stability. The femur traction was dropped and sequentially externally rotated while performing a release of the posterior and superomedial tissues off of the proximal femur to allow for mobility, care was taken to preserve the external rotators and piriformis attachments.  The remaining interval between the abductors and the capsule was dissected out and a retractor was placed over the superolateral aspect of the femur over the greater trochanter.  The leg was carefully brought down into extension and adducted to provide visualization of the proximal femur for broaching.  The femur was then sequentially broached up to an appropriate size which provided for good fill and stability to the femoral broach.  A trial neck and head were placed on the femoral broach and the leg was brought up for reduction.  The hip was reduced and manual check of stability was performed.  The hip was found to be stable in flexion internal rotation and extension external rotation.  Leg lengths were confirmed on fluoroscopy.   The hip was then dislocated the trial neck and head were removed.  The leg was then brought down into extension and adduction in the proximal femur was reexposed.  The broach trial was removed and the femur was irrigated with normal saline prior to the real femoral stem being implanted.  After the femoral stem was seated and shown to have good fit and fill the appropriate head was impacted the leg was brought up and reduced.  There was good range of motion with stability in flexion internal rotation and extension external rotation on testing.  Leg lengths were found to be appropriate on fluoroscopic evaluation at this time.  The hip was then irrigated with betdine based surgiphor solution and then saline solution.  The capsulotomy was repaired with Ethibond sutures.  A pericapsular and peritrochanteric cocktail with Exparel and bupivacaine was  then injected as well as the subcutaneous tissues. The fascia was closed with a #1 barbed running suture.  The deep tissues were closed with Vicryl sutures the subcutaneous tissues were closed with interrupted Vicryl sutures and a running barbed 4-0 suture.  The skin was then reinforced with Dermabond and a sterile dressing was placed.   The patient was awoken from anesthesia transferred off of the operating room table onto a hospital bed where examination of leg lengths found the leg lengths to be equal with a good distal pulse.  The patient was then transferred to the PACU in stable condition.

## 2023-07-28 NOTE — Anesthesia Procedure Notes (Signed)
 Procedure Name: Intubation Date/Time: 07/28/2023 7:45 AM  Performed by: Maryla Morrow., CRNAPre-anesthesia Checklist: Patient identified, Patient being monitored, Timeout performed, Emergency Drugs available and Suction available Patient Re-evaluated:Patient Re-evaluated prior to induction Oxygen Delivery Method: Circle system utilized Preoxygenation: Pre-oxygenation with 100% oxygen Induction Type: IV induction Ventilation: Mask ventilation without difficulty Laryngoscope Size: 3 and McGrath Grade View: Grade I Tube type: Oral Tube size: 6.5 mm Number of attempts: 1 Airway Equipment and Method: Stylet Placement Confirmation: ETT inserted through vocal cords under direct vision, positive ETCO2 and breath sounds checked- equal and bilateral Secured at: 19 cm Tube secured with: Tape Dental Injury: Teeth and Oropharynx as per pre-operative assessment

## 2023-07-28 NOTE — Progress Notes (Signed)
 Physical Therapy Evaluation Patient Details Name: Dorothy Rogers MRN: [id number] DOB: 04/11/1951 Today's Date: 07/28/2023  History of Present Illness  Dorothy Rogers is a 73 y.o. female with a history of osteoarthritis of the left hip. Patient is s/p L Anterior THA.  Clinical Impression  Patient received in bed supine, agreeable to PT evaluation. Patient is POD 0 L Anterior THA. Patient was IND with Mobility/ADLs. Patient lives in multi level home with spouse, 1 STE. Patient can live on main level.   On evaluation, patient with significant pain in R Eye. Patient unable to tolerate lights in room and/or open eyes for participation therefore evaluation limited. RN Notified. Potential for R corneal abrasion during surgery. Patient able to complete bed mobility with CGA, no use of rails. Patient able to stand from EOB with RW and CGA, and lateral stand step transfer with RW and CGA. Pt continue to maintain eyes closed throughout due to pain, therefore unable to safely attempt gait/stair negotiation this date/time. Reviewed THA exercise handout, will benefit from re-education as patient maintained eyes closed throughout education. Patient will benefit from skilled acute PT services to address functional impairments (see below for additional) and maximize functional mobility. Anticipate the need for follow up PT services upon acute hospital discharge. Will continue to follow acutely.       If plan is discharge home, recommend the following: A little help with walking and/or transfers;Help with stairs or ramp for entrance;Assist for transportation   Can travel by private vehicle        Equipment Recommendations Rolling walker (2 wheels);BSC/3in1  Recommendations for Other Services       Functional Status Assessment Patient has had a recent decline in their functional status and demonstrates the ability to make significant improvements in function in a reasonable and predictable amount of time.      Precautions / Restrictions Precautions Precautions: Anterior Hip Recall of Precautions/Restrictions: Intact Restrictions Weight Bearing Restrictions Per Provider Order: No      Mobility  Bed Mobility Overal bed mobility: Needs Assistance Bed Mobility: Supine to Sit, Sit to Supine     Supine to sit: Contact guard Sit to supine: Contact guard assist   General bed mobility comments: Pt able to complete supine <> sit with CGA; increased time required for completion. Patient keeping eyes closed throughout entirety due to pain in R Eye. RN notified. Patient sustained a R Corneal Abrasion? during surgery and significantly limiting evaluation due to unable to maintain eyes open/bright lights.    Transfers Overall transfer level: Needs assistance Equipment used: Rolling walker (2 wheels) Transfers: Sit to/from Stand, Bed to chair/wheelchair/BSC Sit to Stand: Contact guard assist   Step pivot transfers: Contact guard assist       General transfer comment: Pt able to stand from EOB with CGA; cues for hand placement. Use of RW for improved stability. Encouraged ambulation to bathroom, patient unable due to inability to keeps eyes open for more than 2-3 seconds due to Corneal Abrasion. Able to stand step transfer from bed > bedside commode with CGA RW.    Ambulation/Gait               General Gait Details: deferred due to unable to tolerate light, maintain eyes open.  Stairs            Wheelchair Mobility     Tilt Bed    Modified Rankin (Stroke Patients Only)       Balance Overall balance assessment: Needs assistance Sitting-balance  support: Feet supported, No upper extremity supported Sitting balance-Leahy Scale: Normal     Standing balance support: Bilateral upper extremity supported, During functional activity, Reliant on assistive device for balance Standing balance-Leahy Scale: Good Standing balance comment: good stability with use of RW                              Pertinent Vitals/Pain Pain Assessment Pain Assessment: No/denies pain (Denies pain in hip; Patient endorses significant pain in R Eye. RN Notified.)    Home Living Family/patient expects to be discharged to:: Private residence Living Arrangements: Spouse/significant other Available Help at Discharge: Family Type of Home: House Home Access: Stairs to enter Entrance Stairs-Rails: None (one step/threshold to get onto porch) Secretary/administrator of Steps: 1 Alternate Level Stairs-Number of Steps: 12 Home Layout: Multi-level;Able to live on main level with bedroom/bathroom Home Equipment: None      Prior Function Prior Level of Function : Independent/Modified Independent             Mobility Comments: Reports IND with mobility; No AD use; began to be limited due to increased pain ADLs Comments: IND with ADLs     Extremity/Trunk Assessment   Upper Extremity Assessment Upper Extremity Assessment: Overall WFL for tasks assessed    Lower Extremity Assessment Lower Extremity Assessment: Overall WFL for tasks assessed    Cervical / Trunk Assessment Cervical / Trunk Assessment: Normal  Communication   Communication Communication: No apparent difficulties    Cognition Arousal: Alert Behavior During Therapy: WFL for tasks assessed/performed   PT - Cognitive impairments: No apparent impairments                         Following commands: Intact       Cueing Cueing Techniques: Verbal cues     General Comments      Exercises Total Joint Exercises Ankle Circles/Pumps: Strengthening, Left, 5 reps, Supine, AROM Quad Sets: Strengthening, Left, 5 reps, Supine, AROM Straight Leg Raises: AROM, Strengthening, Left, Both, 5 reps, Supine Other Exercises Other Exercises: Educated on Total Hip Exercise Handout. Would benefit from a review at next session, because patient with limited ability to open eyes due to corneal abrasion.    Assessment/Plan    PT Assessment Patient needs continued PT services  PT Problem List Decreased strength;Decreased range of motion;Decreased activity tolerance;Decreased balance;Decreased mobility;Pain       PT Treatment Interventions DME instruction;Stair training;Gait training;Functional mobility training;Therapeutic activities;Therapeutic exercise;Neuromuscular re-education;Balance training    PT Goals (Current goals can be found in the Care Plan section)  Acute Rehab PT Goals Patient Stated Goal: Get Home; Eye to Feel Better PT Goal Formulation: With patient Time For Goal Achievement: 08/11/23 Potential to Achieve Goals: Good    Frequency BID     Co-evaluation               AM-PAC PT "6 Clicks" Mobility  Outcome Measure Help needed turning from your back to your side while in a flat bed without using bedrails?: A Little Help needed moving from lying on your back to sitting on the side of a flat bed without using bedrails?: A Little Help needed moving to and from a bed to a chair (including a wheelchair)?: A Little Help needed standing up from a chair using your arms (e.g., wheelchair or bedside chair)?: A Little Help needed to walk in hospital room?: A Little Help needed climbing 3-5  steps with a railing? : A Little 6 Click Score: 18    End of Session Equipment Utilized During Treatment: Gait belt Activity Tolerance: Other (comment) (Limited secondary to corneal abrasion) Patient left: in bed;with call bell/phone within reach;with bed alarm set Nurse Communication: Mobility status PT Visit Diagnosis: Unsteadiness on feet (R26.81);Other abnormalities of gait and mobility (R26.89);Difficulty in walking, not elsewhere classified (R26.2);Pain Pain - Right/Left: Left Pain - part of body: Hip    Time: 1610-9604 PT Time Calculation (min) (ACUTE ONLY): 32 min   Charges:   PT Evaluation $PT Eval Low Complexity: 1 Low   PT General Charges $$ ACUTE PT VISIT: 1  Visit         Creed Copper Fairly, PT, DPT 07/28/23 4:07 PM

## 2023-07-28 NOTE — H&P (Signed)
 History of Present Illness: Dorothy Rogers is an 73 y.o. female who presents for history and physical for left anterior total hip arthroplasty with Dr. Audelia Acton on 07/28/2023. She has had several years of increasing pain in the left greater than right hip with both hips showing advanced arthritic changes on x-ray. She has a dull aching pain in her groin mostly on the left side as well as her left lateral hip. Pain is constant and increased with time that she is on her feet. Pain can reach 10 out of 10. She has pain in with activities involving hip flexion. No relief with Tylenol. Unable to take NSAIDs due to being on Plavix. She has had injections into her hip joint back in 2022 with excellent relief but only for short amount of time.  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 She has done well with titanium in the past and has titanium hardware in her lumbar spine and in her cardiac stents.   The patient denies fevers, chills, numbness, tingling, shortness of breath, chest pain, recent illness, or any trauma.  The patient is a diabetic with an A1c of 7.9 and a BMI of 32. She is a non-smoker and does not use any tobacco products.  Past Medical History: Past Medical History:  Diagnosis Date  Anemia  Asthma (HHS-HCC) 07/31/2019  Chronic pain  Coronary artery disease  stents x 6  Degenerative disk disease  Diabetes mellitus type 2, uncomplicated (CMS/HHS-HCC)  Encounter for blood transfusion  no reaction  History of degenerative disc disease  Hyperlipemia  Hypertension  Kidney disease  Malignant fibrous histiocytoma (CMS/HHS-HCC)  Motion sickness  OSA (obstructive sleep apnea) 12/07/2017  Peripheral neuropathy  Peripheral vascular disease (CMS-HCC)  Transient alteration of awareness  transient after a surgery - didn't recognize anyone including family and spouse. resolved quickly.   Past Surgical  History: Past Surgical History:  Procedure Laterality Date  fibrohistiocytoma excision 1986  TOOTH EXTRACTION 01/2013  POSTERIOR LUMBAR SPINE FUSION ONE LEVEL Bilateral 03/27/2013  Procedure: BILATERAL LUMBAR DECOMPRESSION, STABILIZATION SPINAL FUSION L4-5, EXCISION DISC HERNIATION L4-5 RIGHT.; Surgeon: Marlane Mingle, MD; Location: [facility] OR; Service: Orthopedics; Laterality: Bilateral;  INSTRUMENTATION POSTERIOR SPINE 1/2 VERTEBRAL SEGMENTS W/O FIXATION Bilateral 03/27/2013  Procedure: 22840; Surgeon: Marlane Mingle, MD; Location: [facility] OR; Service: Orthopedics; Laterality: Bilateral;  LAMINECTOMY POSTERIOR LUMBAR FACETECTOMY & FORAMINOTOMY W/DECOMP Bilateral 03/27/2013  Procedure: BILATERAL LUMBAR DECOMPRESSION L3-4, EXCISION DISC HERNIATION L3-4 RIGHT; Surgeon: Marlane Mingle, MD; Location: [facility] OR; Service: Orthopedics; Laterality: Bilateral;  LAMINECTOMY POSTERIOR CERVICLE DECOMP W/FACETECTOMY & FORAMINOTOMY Bilateral 03/27/2013  Procedure: 04540 X 2; Surgeon: Marlane Mingle, MD; Location: [facility] OR; Service: Orthopedics; Laterality: Bilateral;  EXPLORATION SPINAL FUSION Bilateral 03/27/2013  Procedure: BILATERAL FUSION EXPLORATION,; Surgeon: Marlane Mingle, MD; Location: [facility] OR; Service: Orthopedics; Laterality: Bilateral;  AUTOGRAFT STRUCTURAL ICBG OBTAINED SEPARATE INCISION FOR SPINE SURGERY Right 03/27/2013  Procedure: RIGHT ICBG; Surgeon: Marlane Mingle, MD; Location: [facility] OR; Service: Orthopedics; Laterality: Right;  LAMINECTOMY LUMBAR EXCISON/EVACUATION EXTRADURAL INTRASPINAL LESION Bilateral 03/27/2013  Procedure: LAMINECTOMY LUMBAR EXCISON/EVACUATION EXTRADURAL INTRASPINAL LESION; Surgeon: Marlane Mingle, MD; Location: [facility] OR; Service: Orthopedics; Laterality: Bilateral;  EYE SURGERY Left 2015  cataract  CATARACT EXTRACTION Left 11/14/2013  POSTERIOR LUMBAR SPINE FUSION ONE LEVEL Bilateral 05/09/2014  Procedure: BILATERAL REEXPLORATION LUMBAR  DECOMPRESSION, STABILIZATION SPINAL FUSION L3-4; Surgeon: Marlane Mingle, MD; Location: [facility] OR; Service: Orthopedics; Laterality: Bilateral;  INSTRUMENTATION POSTERIOR SPINE 1/2  VERTEBRAL SEGMENTS W/O FIXATION Bilateral 05/09/2014  Procedure: INSTRUMENTATION POSTERIOR SPINE 1/2 VERTEBRAL SEGMENTS W/O FIXATION; Surgeon: Marlane Mingle, MD; Location: [facility] OR; Service: Orthopedics; Laterality: Bilateral;  LAMINOTOMY REEXPLORATION POSTERIOR LUMBAR W/NERVE DECOMP & DISCECTOMY Bilateral 05/09/2014  Procedure: 54098-11 ; Surgeon: Marlane Mingle, MD; Location: [facility] OR; Service: Orthopedics; Laterality: Bilateral;  LAMINOTOMY REEXPLORATION POSTERIOR LUMBAR W/NERVE DECOMP & DISCECTOMY Bilateral 05/09/2014  Procedure: 91478-29 X 2; Surgeon: Marlane Mingle, MD; Location: [facility] OR; Service: Orthopedics; Laterality: Bilateral;  EXPLORATION SPINAL FUSION Bilateral 05/09/2014  Procedure: EXPLORATION SPINAL FUSION; Surgeon: Marlane Mingle, MD; Location: [facility] OR; Service: Orthopedics; Laterality: Bilateral;  REMOVAL POSTERIOR SEGMENTAL LUMBAR/THORACIC SPINAL HARDWARE Bilateral 05/09/2014  Procedure: METAL REMOVAL (EXPEDIUM) REEXPLORATION DECOMPRESSION/ FUSION EXPLORATION L4-5 ; Surgeon: Marlane Mingle, MD; Location: [facility] OR; Service: Orthopedics; Laterality: Bilateral;  AUTOGRAFT STRUCTURAL ICBG OBTAINED SEPARATE INCISION FOR SPINE SURGERY Bilateral 05/09/2014  Procedure: ICBG; Surgeon: Marlane Mingle, MD; Location: [facility] OR; Service: Orthopedics; Laterality: Bilateral;  EYE SURGERY Right 03/2015  cataract  POSTERIOR LUMBAR SPINE FUSION ONE LEVEL LATERAL TRANSVERSE TECHNIQUE Bilateral 05/30/2015  Procedure: BILATERAL LUMBAR DECOMPRESSION, STABILIZATION SPINAL FUSION L2-3; EXCISION CENTRAL LEFT DISC HERNIATION L2-3; LEFT ICBG; METAL REMOVAL, REEXPLORATION LUMBAR DECOMPRESSION, FUSION EXPLORATION L3-4, L4-5; Surgeon: Marlane Mingle, MD; Location: [facility] OR; Service: Orthopedics; Laterality:  Bilateral;  INSTRUMENTATION POSTERIOR SPINE 3 TO 6 VERTEBRAL SEGMENTS Bilateral 05/30/2015  Procedure: POSTERIOR SEGMENTAL INSTRUMENTATION (EG, PEDICLE FIXATION, DUAL RODS WITH MULTIPLE HOOKS AND SUBLAMINAR WIRES); 3 TO 6 VERTEBRAL SEGMENTS (LIST IN ADDITION TO PRIMARY PROCEDURE); Surgeon: Marlane Mingle, MD; Location: [facility] OR; Service: Orthopedics; Laterality: Bilateral;  LAMINOTOMY REEXPLORATION POSTERIOR LUMBAR W/NERVE DECOMP & DISCECTOMY Bilateral 05/30/2015  Procedure: LAMINOTOMY REEXPLORATION POSTERIOR LUMBAR W/NERVE DECOMP & DISCECTOMY; [id number]; Surgeon: Marlane Mingle, MD; Location: [facility] OR; Service: Orthopedics; Laterality: Bilateral;  LAMINOTOMY REEXPLORATION POSTERIOR LUMBAR W/NERVE DECOMP & DISCECTOMY Bilateral 05/30/2015  Procedure: LAMINOTOMY REEXPLORATION EACH ADDITIONAL LUMBAR INTERSPACE; 56213-08M5; Surgeon: Marlane Mingle, MD; Location: [facility] OR; Service: Orthopedics; Laterality: Bilateral;  REMOVAL POSTERIOR SEGMENTAL LUMBAR/THORACIC SPINAL HARDWARE Bilateral 05/30/2015  Procedure: REMOVAL OF POSTERIOR SEGMENTAL THORACIC / LUMBAR INSTRUMENTATION; Surgeon: Marlane Mingle, MD; Location: [facility] OR; Service: Orthopedics; Laterality: Bilateral;  EXPLORATION SPINAL FUSION Bilateral 05/30/2015  Procedure: EXPLORATION OF SPINAL FUSION; Surgeon: Marlane Mingle, MD; Location: [facility] OR; Service: Orthopedics; Laterality: Bilateral;  AUTOGRAFT STRUCTURAL ICBG OBTAINED SEPARATE INCISION FOR SPINE SURGERY Left 05/30/2015  Procedure: AUTOGRAFT FOR SPINE SURGERY ONLY (INCL HARVESTING GRAFT); STRUCTURAL, BICORTICAL OR TRICORTICAL (THROUGH SEPARATE SKIN OR FASCIAL INCISION) (LIST IN ADDITION TO PRIMARY PROCEDURE); Surgeon: Marlane Mingle, MD; Location: [facility] OR; Service: Orthopedics; Laterality: Left;  REPAIR HIATAL HERNIA 02/2016  CHOLECYSTECTOMY 04/2016  POSTERIOR LUMBAR SPINE FUSION ONE LEVEL LATERAL TRANSVERSE TECHNIQUE Bilateral 01/21/2017  Procedure: BILATERAL LUMBAR  DECOMPRESSION L1-2, L2-3, L3-4,; Surgeon: Marlane Mingle, MD; Location: [facility] OR; Service: Orthopedics; Laterality: Bilateral;  INSTRUMENTATION POSTERIOR SPINE 3 TO 6 VERTEBRAL SEGMENTS Bilateral 01/21/2017  Procedure: SPINAL FUSION / INSTRUMENTATION L1-2; Surgeon: Marlane Mingle, MD; Location: [facility] OR; Service: Orthopedics; Laterality: Bilateral;  LAMINECTOMY POSTERIOR LUMBAR FACETECTOMY & FORAMINOTOMY W/DECOMP Bilateral 01/21/2017  Procedure: [id number] ; Surgeon: Marlane Mingle, MD; Location: [facility] OR; Service: Orthopedics; Laterality: Bilateral;  LAMINECTOMY POSTERIOR CERVICLE DECOMP W/FACETECTOMY & FORAMINOTOMY Bilateral 01/21/2017  Procedure: [id number] ; Surgeon: Marlane Mingle, MD; Location: [facility] OR; Service: Orthopedics; Laterality: Bilateral;  EXPLORATION SPINAL FUSION Bilateral 01/21/2017  Procedure: [id number]; Surgeon: Marlane Mingle, MD; Location: [facility] OR; Service: Orthopedics; Laterality: Bilateral;  REMOVAL POSTERIOR SEGMENTAL LUMBAR/THORACIC SPINAL HARDWARE Bilateral 01/21/2017  Procedure: METAL  REMOVAL / FUSION EXPLORATION L2-3, L3-4; Surgeon: Marlane Mingle, MD; Location: [facility] OR; Service: Orthopedics; Laterality: Bilateral;  INSERTION MORSELIZED BONE ALLOGRAFT FOR SPINE SURGERY N/A 01/21/2017  Procedure: LOCAL AUTOGRAFT Effie Shy; Surgeon: Marlane Mingle, MD; Location: [facility] OR; Service: Orthopedics; Laterality: N/A;  COLPORRHAPHY FOR REPAIR RECTOCELE POSTERIOR N/A 07/13/2021  Procedure: Posterior repair with or without perineorrhaphy; Surgeon: Lynnell Catalan, MD; Location: [facility] OR; Service: Gynecology; Laterality: N/A;  BACK SURGERY  BONE GRAFT HIP ILIAC CREST  COLPORRHAPHY FOR REPAIR CYSTOCELE ANTERIOR  cyst removed  right wrist  HERNIA REPAIR  HYSTERECTOMY  PTCA with stent placement x6  rectal prolaspe  TRANSFORAMINAL LUMBAR INTERBODY FUSION MINIMALLY INVASIVE  anterior/posterior   Past Family History: Family History  Problem  Relation Age of Onset  Arthritis Mother  Cancer Mother  Coronary Artery Disease (Blocked arteries around heart) Father  Myocardial Infarction (Heart attack) Father  Thyroid disease Sister  Arthritis Sister  Asthma Brother  Diabetes Maternal Grandmother  Anesthesia problems Neg Hx   Medications: Current Outpatient Medications  Medication Sig Dispense Refill  acetaminophen (TYLENOL) 500 MG tablet Take 500 mg by mouth every 6 (six) hours as needed for Pain.  acyclovir (ZOVIRAX) 400 MG tablet Take 1 tablet (400 mg total) by mouth once daily 90 tablet 3  amitriptyline (ELAVIL) 10 MG tablet Take 1 tablet (10 mg total) by mouth at bedtime 30 tablet 11  atorvastatin (LIPITOR) 40 MG tablet Take 1 tablet by mouth once daily 90 tablet 0  biotin 10 mg Tab Take 10 mg by mouth once daily Biotin with zinc  calcium carbonate-vitamin D3 (CALTRATE 600+D) 600 mg(1,500mg ) -400 unit tablet Take 1 tablet by mouth once daily.  clopidogreL (PLAVIX) 75 mg tablet Take 1 tablet by mouth once daily 90 tablet 0  cyanocobalamin (VITAMIN B12) 1000 MCG tablet Take 1,000 mcg by mouth once daily.   dilTIAZem (CARDIZEM CD) 180 MG CD capsule Take 1 capsule (180 mg total) by mouth once daily 90 capsule 3  ezetimibe (ZETIA) 10 mg tablet Take 1 tablet by mouth once daily  ferrous sulfate 325 (65 FE) MG tablet Take 325 mg by mouth daily with breakfast.  glipiZIDE (GLUCOTROL) 10 MG tablet TAKE 1 TABLET BY MOUTH TWICE DAILY BEFORE MEALS 180 tablet 0  insulin LISPRO (HUMALOG KWIKPEN) pen injector (concentration 100 units/mL) INJECT 0-15u UNDER THE SKIN THREE TIMES DAILY AS NEEDED 15 mL 5  LANTUS SOLOSTAR U-100 INSULIN pen injector (concentration 100 units/mL) INJECT 30 TO 50 UNITS SUBCUTANEOUSLY ONCE DAILY AS DIRECTED 30 mL 0  levothyroxine (SYNTHROID) 50 MCG tablet Take 1 tablet (50 mcg total) by mouth once daily Take on an empty stomach with a glass of water at least 30-60 minutes before breakfast. 30 tablet 11  LORazepam  (ATIVAN) 1 MG tablet Take 1 tablet (1 mg total) by mouth every 8 (eight) hours as needed for Anxiety 30 tablet 0  losartan (COZAAR) 100 MG tablet Take 1 tablet by mouth once daily 90 tablet 0  ondansetron (ZOFRAN-ODT) 8 MG disintegrating tablet DISSOLVE ONE TABLET IN MOUTH EVERY 8 HOURS AS NEEDED FOR NAUSEA 90 tablet 0  pantoprazole (PROTONIX) 20 MG DR tablet TAKE 1 TABLET BY MOUTH TWICE DAILY BEFORE MEAL(S) 180 tablet 0  pen needle, diabetic 32 gauge x 5/32" Ndle USE AS DIRECTED 150 each 3  triamcinolone 0.1 % cream Apply topically 2 (two) times daily 45 g 0   No current facility-administered medications for this visit.   Allergies: Allergies  Allergen Reactions  Shellfish Containing  Products Anaphylaxis  Sildenafil Anaphylaxis  Beta-Blockers (Beta-Adrenergic Blocking Agts) Hives  Heparin Rash  Carbamazepine Nausea and Rash  Celebrex [Celecoxib] Other (See Comments)  Tightness in head  Cymbalta [Duloxetine] Other (See Comments)  Jittery  Gabapentin Other (See Comments)  Indomethacin Other (See Comments)  Felt like she was having a heart attack  Niacin Other (See Comments)  flushing  Oxycodone Nausea  Tramadol Swelling  Vicodin [Hydrocodone-Acetaminophen] Other (See Comments)  Hypoglycemia  Heparin Analogues Rash    Visit Vitals: Vitals:  07/13/23 1406  BP: (!) 140/62    Review of Systems:  A comprehensive 14 point ROS was performed, reviewed, and the pertinent orthopaedic findings are documented in the HPI.  Physical Exam: Body mass index is 31.09 kg/m. General:  Well developed, well nourished, no apparent distress, normal affect, normal gait with no antalgic component.   HEENT: Head normocephalic, atraumatic, PERRL.   Abdomen: Soft, non tender, non distended, Bowel sounds present.  Heart: Examination of the heart reveals regular, rate, and rhythm. There is no murmur noted on ascultation. There is a normal apical pulse.  Lungs: Lungs are clear to  auscultation. There is no wheeze, rhonchi, or crackles. There is normal expansion of bilateral chest walls.   Bilateral hip exam  SKIN: intact SWELLING: none WARMTH: no warmth TENDERNESS: none, Stinchfield Positive on both sides ROM: 0 degrees of internal rotation and 30 degrees of external rotation on the right pain with internal rotation hip flexion up to 100 degrees localized to the lateral groin. 0 degrees of internal rotation and 30 degrees of external rotation on the left flexion up to 95 degrees with reproduction of pain to the lateral groin.  STRENGTH: limited secondary to pain GAIT: antalgic and stiff-legged STABILITY: stable to testing CREPITUS: no LEG LENGTH DISCREPANCY: none NEUROLOGICAL EXAM: normal VASCULAR EXAM: normal LUMBAR SPINE: tenderness: no straight leg raising sign: no motor exam: normal  Hip Imaging :  I have reviewed AP pelvis and lateral hip X-rays (3 views) taken 06/22/2023 of bilateral hips which reveal severe bilateral neural degenerative changes in both hips with joint space narrowing with bone-on-bone articulation in both hips, osteophyte formation sclerosis and cystic changes. Partially visualized lumbar spine shows some hardware along the L5-S1 area as well as degenerative changes in the bilateral SI joints. Posterior aspects of both greater trochanters show calcifications as well.   Assessment:  Encounter Diagnosis  Name Primary?  Primary osteoarthritis of left hip Yes   Advanced left hip osteoarthritis  Plan: Glenisha is a 73 year old female who presents today for a history and physical for left anterior total hip arthroplasty with Dr. Audelia Acton on 07/28/2023. She has advanced left hip osteoarthritis. No relief with conservative treatment. Pain interferes with quality of life and activities daily living. Risks, benefits, complications of a left anterior total hip arthroplasty have discussed with the patient. Patient has agreed and consented the procedure  with Dr. Audelia Acton on 07/28/2023.  The hospitalization and post-operative care and rehabilitation were also discussed. The use of perioperative antibiotics and DVT prophylaxis were discussed. The risk, benefits and alternatives to a surgical intervention were discussed at length with the patient. The patient was also advised of risks related to the medical comorbidities and elevated body mass index (BMI). A lengthy discussion took place to review the most common complications including but not limited to: deep vein thrombosis, pulmonary embolus, heart attack, stroke, infection, wound breakdown, heterotopic ossification, dislocation, numbness, leg length in-equality, intraoperative fracture, damage to nerves, tendon,muscles, arteries or other blood vessels,  death and other possible complications from anesthesia. The patient was told that we will take steps to minimize these risks by using sterile technique, antibiotics and DVT prophylaxis when appropriate and follow the patient postoperatively in the office setting to monitor progress. The possibility of recurrent pain, no improvement in pain and actual worsening of pain were also discussed with the patient. The risk of dislocation following total hip replacement was discussed and potential precautions to prevent dislocation were reviewed.   Patient received cardiac clearance, pulmonary clearance and medical clearance for surgery. We plan to temporarily turn off her spinal cord stimulator during surgery.

## 2023-07-28 NOTE — Discharge Instructions (Signed)
 [facility] They will call you to set up when they are coming out to see you   [address], [place], [place] [address] Hours:  Open ? Closes 5?PM Phone: [phone]     Instructions after Anterior Total Hip Replacement        Dr. Regenia Skeeter., M.D.      Dept. of Orthopaedics & Sports Medicine  [facility]  [address]  [place], [place]  [address]  Phone: [phone]   Fax: [phone]    DIET: Drink plenty of non-alcoholic fluids. Resume your normal diet. Include foods high in fiber.  ACTIVITY:  You may use crutches or a walker with weight-bearing as tolerated, unless instructed otherwise. You may be weaned off of the walker or crutches by your Physical Therapist.  Continue doing gentle exercises. Exercising will reduce the pain and swelling, increase motion, and prevent muscle weakness.   Please continue to use the TED compression stockings for 2 weeks. You may remove the stockings at night, but should reapply them in the morning. Do not drive or operate any equipment until instructed.  WOUND CARE:  Continue to use ice packs periodically to reduce pain and swelling. You may shower with honeycomb dressing 3 days after your surgery. Do not submerge incision site under water. Remove honeycomb dressing 7 days after surgery and allow dermabond to fall off on its own.   MEDICATIONS: You may resume your regular medications. Please take the pain medication as prescribed on the medication list. Do not take pain medication on an empty stomach. You have been given a prescription for a blood thinner to prevent blood clots. Please take the medication as instructed. Pain medications and iron supplements can cause constipation. Use a stool softener (Senokot or Colace) on a daily basis and a laxative (dulcolax or miralax) as needed. Do not drive or drink alcoholic beverages when taking pain medications.  POSTOPERATIVE CONSTIPATION PROTOCOL Constipation -  defined medically as fewer than three stools per week and severe constipation as less than one stool per week.  One of the most common issues patients have following surgery is constipation.  Even if you have a regular bowel pattern at home, your normal regimen is likely to be disrupted due to multiple reasons following surgery.  Combination of anesthesia, postoperative narcotics, change in appetite and fluid intake all can affect your bowels.  In order to avoid complications following surgery, here are some recommendations in order to help you during your recovery period.  Colace (docusate) - Pick up an over-the-counter form of Colace or another stool softener and take twice a day as long as you are requiring postoperative pain medications.  Take with a full glass of water daily.  If you experience loose stools or diarrhea, hold the colace until you stool forms back up.  If your symptoms do not get better within 1 week or if they get worse, check with your doctor.  Dulcolax (bisacodyl) - Pick up over-the-counter and take as directed by the product packaging as needed to assist with the movement of your bowels.  Take with a full glass of water.  Use this product as needed if not relieved by Colace only.   MiraLax (polyethylene glycol) - Pick up over-the-counter to have on hand.  MiraLax is a solution that will increase the amount of water in your bowels to assist with bowel movements.  Take as directed and can mix with a glass of water, juice, soda, coffee, or tea.  Take  if you go more than two days without a movement. Do not use MiraLax more than once per day. Call your doctor if you are still constipated or irregular after using this medication for 7 days in a row.  If you continue to have problems with postoperative constipation, please contact the office for further assistance and recommendations.  If you experience "the worst abdominal pain ever" or develop nausea or vomiting, please contact the  office immediatly for further recommendations for treatment.   CALL THE OFFICE FOR: Temperature above 101 degrees Excessive bleeding or drainage on the dressing. Excessive swelling, coldness, or paleness of the toes. Persistent nausea and vomiting.  FOLLOW-UP:  You should have an appointment to return to the office in 2 weeks after surgery. Arrangements have been made for continuation of Physical Therapy (either home therapy or outpatient therapy).

## 2023-07-28 NOTE — Progress Notes (Addendum)
 Patient is not able to walk the distance required to go the bathroom, or he/she is unable to safely negotiate stairs required to access the bathroom.  A 3in1 BSC will alleviate this problem

## 2023-07-29 ENCOUNTER — Encounter: Payer: Self-pay | Admitting: Orthopedic Surgery

## 2023-07-29 DIAGNOSIS — M160 Bilateral primary osteoarthritis of hip: Secondary | ICD-10-CM | POA: Diagnosis not present

## 2023-07-29 LAB — CBC
HCT: 24.3 % — ABNORMAL LOW (ref 36.0–46.0)
Hemoglobin: 8 g/dL — ABNORMAL LOW (ref 12.0–15.0)
MCH: 30.9 pg (ref 26.0–34.0)
MCHC: 32.9 g/dL (ref 30.0–36.0)
MCV: 93.8 fL (ref 80.0–100.0)
Platelets: 154 10*3/uL (ref 150–400)
RBC: 2.59 MIL/uL — ABNORMAL LOW (ref 3.87–5.11)
RDW: 12.8 % (ref 11.5–15.5)
WBC: 12 10*3/uL — ABNORMAL HIGH (ref 4.0–10.5)
nRBC: 0 % (ref 0.0–0.2)

## 2023-07-29 LAB — BASIC METABOLIC PANEL
Anion gap: 7 (ref 5–15)
BUN: 46 mg/dL — ABNORMAL HIGH (ref 8–23)
CO2: 19 mmol/L — ABNORMAL LOW (ref 22–32)
Calcium: 8.1 mg/dL — ABNORMAL LOW (ref 8.9–10.3)
Chloride: 107 mmol/L (ref 98–111)
Creatinine, Ser: 2.11 mg/dL — ABNORMAL HIGH (ref 0.44–1.00)
GFR, Estimated: 24 mL/min — ABNORMAL LOW (ref >60)
Glucose, Bld: 375 mg/dL — ABNORMAL HIGH (ref 70–99)
Potassium: 5.1 mmol/L (ref 3.5–5.1)
Sodium: 133 mmol/L — ABNORMAL LOW (ref 135–145)

## 2023-07-29 LAB — GLUCOSE, CAPILLARY
Glucose-Capillary: 325 mg/dL — ABNORMAL HIGH (ref 70–99)
Glucose-Capillary: 364 mg/dL — ABNORMAL HIGH (ref 70–99)

## 2023-07-29 MED ORDER — ALUM & MAG HYDROXIDE-SIMETH 200-200-20 MG/5ML PO SUSP
ORAL | Status: AC
  Filled 2023-07-29: qty 30

## 2023-07-29 MED ORDER — DOCUSATE SODIUM 100 MG PO CAPS: 100.0000 mg | ORAL_CAPSULE | Freq: Two times a day (BID) | ORAL | 0 refills | Status: DC

## 2023-07-29 MED ORDER — OXYCODONE HCL 5 MG PO TABS: 2.5000 mg | ORAL_TABLET | ORAL | 0 refills | Status: DC | PRN

## 2023-07-29 MED ORDER — ASPIRIN 81 MG PO CHEW: 81.0000 mg | CHEWABLE_TABLET | Freq: Two times a day (BID) | ORAL | 0 refills | Status: AC

## 2023-07-29 MED ORDER — ONDANSETRON HCL 4 MG PO TABS: 4.0000 mg | ORAL_TABLET | Freq: Four times a day (QID) | ORAL | 0 refills | Status: DC | PRN

## 2023-07-29 MED ORDER — POLYMYXIN B-TRIMETHOPRIM 10000-0.1 UNIT/ML-% OP SOLN
1.0000 [drp] | Freq: Four times a day (QID) | OPHTHALMIC | 0 refills | Status: AC
Start: 2023-07-29 — End: 2023-08-03

## 2023-07-29 MED ORDER — METOCLOPRAMIDE HCL 5 MG/ML IJ SOLN
INTRAMUSCULAR | Status: AC
  Filled 2023-07-29: qty 2

## 2023-07-29 MED ORDER — SODIUM CHLORIDE 0.9 % IV BOLUS
1000.0000 mL | Freq: Once | INTRAVENOUS | Status: AC
  Administered 2023-07-29: 1000 mL via INTRAVENOUS

## 2023-07-29 MED ORDER — CLOPIDOGREL BISULFATE 75 MG PO TABS
ORAL_TABLET | ORAL | Status: AC
  Filled 2023-07-29: qty 1

## 2023-07-29 MED ORDER — LOSARTAN POTASSIUM 50 MG PO TABS
ORAL_TABLET | ORAL | Status: AC
  Filled 2023-07-29: qty 3

## 2023-07-29 MED ORDER — PANTOPRAZOLE SODIUM 40 MG PO TBEC
DELAYED_RELEASE_TABLET | ORAL | Status: AC
  Filled 2023-07-29: qty 1

## 2023-07-29 MED ORDER — INSULIN ASPART 100 UNIT/ML IJ SOLN
INTRAMUSCULAR | Status: AC
  Filled 2023-07-29: qty 1

## 2023-07-29 MED ORDER — OXYCODONE HCL 5 MG PO TABS
ORAL_TABLET | ORAL | Status: AC
  Filled 2023-07-29: qty 1

## 2023-07-29 MED ORDER — DILTIAZEM HCL ER COATED BEADS 180 MG PO CP24
ORAL_CAPSULE | ORAL | Status: AC
  Filled 2023-07-29: qty 1

## 2023-07-29 NOTE — Plan of Care (Signed)
Patient progressing towards discharge.

## 2023-07-29 NOTE — Progress Notes (Signed)
 Physical Therapy Treatment Patient Details Name: Dorothy Rogers MRN: [id number] DOB: 10/09/1950 Today's Date: 07/29/2023   History of Present Illness Dorothy Rogers is a 73 y.o. female with a history of osteoarthritis of the left hip. Patient is s/p L Anterior THA.    PT Comments  Pt was seated EOB upon arrival. She is A and O x 4. Supportive spouse present. Pt tolerated session well and is clear  from an acute PT standpoint for safe DC home.    If plan is discharge home, recommend the following: A little help with walking and/or transfers;Help with stairs or ramp for entrance;Assist for transportation     Equipment Recommendations  Rolling walker (2 wheels);BSC/3in1 (pt has received equipment already)      Precautions / Restrictions Precautions Precautions: Anterior Hip Recall of Precautions/Restrictions: Intact Restrictions Weight Bearing Restrictions Per Provider Order: No     Mobility  Bed Mobility  General bed mobility comments: pt was seated EOB pre session and in recliner post session. Reviewed technique to improve in/out of bed    Transfers Overall transfer level: Needs assistance Equipment used: Rolling walker (2 wheels) Transfers: Sit to/from Stand Sit to Stand: Supervision   Ambulation/Gait Ambulation/Gait assistance: Supervision Gait Distance (Feet): 120 Feet Assistive device: Rolling walker (2 wheels) Gait Pattern/deviations: Step-through pattern, Antalgic  General Gait Details: no LOB with use of BUE on RW   Stairs Stairs: Yes Stairs assistance: Contact guard assist, Supervision Stair Management: No rails, Backwards, Forwards, With walker, Step to pattern Number of Stairs: 1   Balance Overall balance assessment: Needs assistance Sitting-balance support: Feet supported, No upper extremity supported Sitting balance-Leahy Scale: Normal     Standing balance support: Bilateral upper extremity supported, During functional activity, Reliant on assistive  device for balance Standing balance-Leahy Scale: Good      Communication Communication Communication: No apparent difficulties  Cognition Arousal: Alert Behavior During Therapy: WFL for tasks assessed/performed   PT - Cognitive impairments: No apparent impairments      Following commands: Intact      Cueing Cueing Techniques: Verbal cues         Pertinent Vitals/Pain Pain Assessment Pain Assessment: 0-10 Pain Score: 7  Pain Location: hip Pain Descriptors / Indicators: Discomfort, Sore Pain Intervention(s): Limited activity within patient's tolerance, Monitored during session, Premedicated before session, Repositioned, Ice applied     PT Goals (current goals can now be found in the care plan section) Acute Rehab PT Goals Patient Stated Goal: go home Progress towards PT goals: Progressing toward goals    Frequency    BID       AM-PAC PT "6 Clicks" Mobility   Outcome Measure  Help needed turning from your back to your side while in a flat bed without using bedrails?: A Little Help needed moving from lying on your back to sitting on the side of a flat bed without using bedrails?: A Little Help needed moving to and from a bed to a chair (including a wheelchair)?: A Little Help needed standing up from a chair using your arms (e.g., wheelchair or bedside chair)?: A Little Help needed to walk in hospital room?: A Little Help needed climbing 3-5 steps with a railing? : A Little 6 Click Score: 18    End of Session   Activity Tolerance: Patient tolerated treatment well Patient left: in chair;with call bell/phone within reach;with chair alarm set Nurse Communication: Mobility status PT Visit Diagnosis: Unsteadiness on feet (R26.81);Other abnormalities of gait and mobility (R26.89);Difficulty in  walking, not elsewhere classified (R26.2);Pain Pain - Right/Left: Left Pain - part of body: Hip     Time: 1610-9604 PT Time Calculation (min) (ACUTE ONLY): 16  min  Charges:    $Gait Training: 8-22 mins PT General Charges $$ ACUTE PT VISIT: 1 Visit                    Jetta Lout PTA 07/29/23, 11:32 AM

## 2023-07-29 NOTE — Progress Notes (Signed)
 Subjective: 1 Day Post-Op Procedure(s) (LRB): TOTAL HIP ARTHROPLASTY ANTERIOR APPROACH (Left) Patient reports pain as mild.   Patient is well, and has had no acute complaints or problems Denies any CP, SOB, ABD pain. We will continue therapy today.  Plan is to go Home after hospital stay.  Objective: Vital signs in last 24 hours: Temp:  [97.6 F (36.4 C)-98.8 F (37.1 C)] 98.1 F (36.7 C) (02/28 0446) Pulse Rate:  [70-93] 70 (02/28 0446) Resp:  [12-18] 12 (02/28 0446) BP: (121-144)/(48-65) 122/58 (02/28 0446) SpO2:  [96 %-100 %] 96 % (02/28 0446) Weight:  [79.4 kg] 79.4 kg (02/27 2124)  Intake/Output from previous day: 02/27 0701 - 02/28 0700 In: 1206.3 [I.V.:706.3; IV Piggyback:500] Out: 200 [Blood:200] Intake/Output this shift: No intake/output data recorded.  Recent Labs    07/29/23 0603  HGB 8.0*   Recent Labs    07/29/23 0603  WBC 12.0*  RBC 2.59*  HCT 24.3*  PLT 154   Recent Labs    07/29/23 0603  NA 133*  K 5.1  CL 107  CO2 19*  BUN 46*  CREATININE 2.11*  GLUCOSE 375*  CALCIUM 8.1*   No results for input(s): "LABPT", "INR" in the last 72 hours.  EXAM General - Patient is Alert, Appropriate, and Oriented Extremity - Neurovascular intact Sensation intact distally Intact pulses distally Dorsiflexion/Plantar flexion intact Dressing - dressing C/D/I and no drainage Motor Function - intact, moving foot and toes well on exam.   Past Medical History:  Diagnosis Date   Anemia    Anxiety    a.) on BZO PRN (lorazepam)   Aortic atherosclerosis (HCC)    Arthritis    lower back, hips   Asthma    Blood transfusion without reported diagnosis    CAD (coronary artery disease)    a.) PCI 07/2007 - stents x 2 (unknown type); b.) PCI 03/2008 - stents x 4 (unknown type); c.) cPET 09/16/2017: no LGE; no ischemia; d.) LHC 10/15/2015: 50-60% mRCA, diffuse mild LAD disease. 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   a.) following repair of hiatal hernia; b.) confirmed by sniff test in 2019; c.) has been seen by a specialist in [place] and advised not a surgical candidate   Diastolic dysfunction    a.) TTE 09/17/2015: EF 65-70%, no RWMAs, G1DD, triv TR/PR; b.) TTE 02/10/2016: EF >55%, no RWMAs, G1DD, triv AR/MR, mild TR/PR; c.) TTE 01/05/2019: EF 60-65%, mild LVH, no RWMAs, G1DD; d.) TTE 08/18/2021: EF >55%, no RWMAs, G1DD, triv PR, mild MR/TR; e.) TTE 06/22/2023: EF >55%, no RWMAs, G1DD, triv AR/TR/PR, mild MR   GERD (gastroesophageal reflux disease)    Herpes simplex    History of coronary angioplasty with insertion of stent 2009   a.) Total of 6 coronary stents as of 07/26/2023   History of hiatal hernia    History of kidney stones    Hyperlipidemia    Hypertension    Hypothyroidism    Intractable nausea and vomiting  06/09/2015   Kyphoscoliosis    Long-term use of aspirin therapy    Malignant fibrous histiocytoma (posterior neck) 1986   Obstructive sleep apnea    a.) no nocturnal PAP therapy   On long term clopidogrel therapy    Peripheral neuropathy    Peripheral vascular disease (HCC)    Pulmonary hypertension (HCC) 05/10/2019   a.) RHC 05/10/2019: mRA 0, mPA 12, mPWCP 0, AO sat 98, PA sat 72.2, CO 3.74, CI 2.04   Status post bilateral cataract extraction    Thrombocytopenia (HCC)    Type 2 diabetes mellitus without complication (HCC)    Vertigo    Vitamin B 12 deficiency     Assessment/Plan:   1 Day Post-Op Procedure(s) (LRB): TOTAL HIP ARTHROPLASTY ANTERIOR APPROACH  (Left) Principal Problem:   S/P total left hip arthroplasty  Estimated body mass index is 31.5 kg/m as calculated from the following:   Height as of this encounter: 5' 2.5" (1.588 m).   Weight as of this encounter: 79.4 kg. Advance diet Up with therapy Pain well-controlled Vital signs are stable Acute postop blood loss anemia with underlying chronic anemia -continue with iron supplement.  Patient asymptomatic.  Baseline hemoglobin 10. Hyperglycemia, will continue with current regimen, will give liter of fluids this morning. Care management to assist with discharge to home with home health PT today pending safe completion of PT goals   DVT Prophylaxis - TED hose and aspirin and plavix Weight-Bearing as tolerated to left leg   T. Cranston Neighbor, PA-C [facility] 07/29/2023, 7:48 AM

## 2023-07-29 NOTE — Inpatient Diabetes Management (Signed)
 Inpatient Diabetes Program Recommendations  AACE/ADA: New Consensus Statement on Inpatient Glycemic Control (2015)  Target Ranges:  Prepandial:   less than 140 mg/dL      Peak postprandial:   less than 180 mg/dL (1-2 hours)      Critically ill patients:  140 - 180 mg/dL   Lab Results  Component Value Date   GLUCAP 364 (H) 07/29/2023   HGBA1C 6.5 (H) 06/09/2015    Review of Glycemic Control  Latest Reference Range & Units 07/28/23 12:12 07/28/23 16:27 07/28/23 22:00 07/29/23 09:29 07/29/23 11:57  Glucose-Capillary 70 - 99 mg/dL 161 (H) 096 (H) 045 (H) 325 (H) 364 (H)  (H): Data is abnormally high Diabetes history: Type 2 DM Outpatient Diabetes medications: Glipizide 10 mg BID, Lantus 36 units QD Current orders for Inpatient glycemic control: Novolog 0-15 units TID & Hs, Lantus 12 units QHS Decadron 8 mg x 1   Inpatient Diabetes Program Recommendations:     Noted hyperglycemia. Assuming response attributed from steroids intraop.  Secure chat sent to RN to admin Lantus and correction this AM.  Plan for discharge. Anticipate trends to improve over next 24-48 hours.   Could consider adding Lantus 12 units x 1.  Thanks, Lujean Rave, MSN, RNC-OB Diabetes Coordinator [phone] (8a-5p)

## 2023-07-29 NOTE — Anesthesia Postprocedure Evaluation (Signed)
 Anesthesia Post Note  Patient: SHERRICA NIEHAUS  Procedure(s) Performed: TOTAL HIP ARTHROPLASTY ANTERIOR APPROACH (Left: Hip)  Patient location during evaluation: PACU Anesthesia Type: General Level of consciousness: awake and alert Pain management: pain level controlled Vital Signs Assessment: post-procedure vital signs reviewed and stable Respiratory status: spontaneous breathing, nonlabored ventilation, respiratory function stable and patient connected to nasal cannula oxygen Cardiovascular status: blood pressure returned to baseline and stable Postop Assessment: no apparent nausea or vomiting Comments: Patient feels that her right eye is back to normal   There were no known notable events for this encounter.   Last Vitals:  Vitals:   07/29/23 0446 07/29/23 0810  BP: (!) 122/58 (!) 125/55  Pulse: 70 87  Resp: 12 15  Temp: 36.7 C 36.9 C  SpO2: 96% 92%    Last Pain:  Vitals:   07/29/23 0446  TempSrc: Temporal  PainSc:                  Cleda Mccreedy Terron Merfeld

## 2023-07-29 NOTE — Discharge Summary (Addendum)
 Physician Discharge Summary  Patient ID: Dorothy Rogers MRN: [id number] DOB/AGE: 08-Jun-1950 73 y.o.  Admit date: 07/28/2023 Discharge date: 07/29/2023  Admission Diagnoses:  S/P total left hip arthroplasty [W09.811]   Discharge Diagnoses: Patient Active Problem List   Diagnosis Date Noted   S/P total left hip arthroplasty 07/28/2023   Asthma 07/31/2019   Esophageal dysphagia    Chronic left hip pain 05/17/2018   Elevated erythrocyte sedimentation rate 05/17/2018   OSA (obstructive sleep apnea) 12/07/2017   Bilateral sciatica 11/29/2016   Gastroparesis due to DM (HCC) 11/26/2016   Cholelithiasis    Protein-calorie malnutrition, severe 05/18/2016   Supraventricular tachycardia (HCC) 05/05/2016   History of paroxysmal supraventricular tachycardia 05/05/2016   SOB (shortness of breath)    Near syncope 04/07/2016   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/30/2015   Presence of stent in coronary artery 05/30/2015   Sacroiliac joint dysfunction 02/10/2015   Status post lumbar spinal fusion 12/25/2014   DDD (degenerative disc disease), lumbar 11/18/2014   Lumbar post-laminectomy syndrome 11/18/2014   Facet syndrome, lumbar 11/18/2014   Piriformis syndrome of left side 11/18/2014   Status post lumbar spine operation  05/09/2014   Adiposity 04/23/2014   Thoracic and lumbosacral neuritis 03/27/2013   H/O arthrodesis 03/27/2013   Peripheral vascular disease (HCC) 03/22/2013   Hypomagnesemia 03/22/2013   Absolute anemia 03/22/2013    Past Medical History:  Diagnosis Date   Anemia    Anxiety    a.) on BZO PRN (lorazepam)   Aortic atherosclerosis (HCC)    Arthritis    lower back, hips   Asthma    Blood transfusion without reported diagnosis    CAD (coronary artery disease)    a.) PCI 07/2007 - stents x 2 (unknown type); b.) PCI 03/2008 - stents x 4 (unknown type); c.) cPET 09/16/2017: no LGE; no ischemia; d.) LHC 10/15/2015: 50-60% mRCA, diffuse mild LAD disease. 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   a.) following repair of hiatal hernia; b.) confirmed by sniff test in 2019; c.) has been seen by a specialist in [place] and advised not a surgical candidate   Diastolic dysfunction    a.) TTE 09/17/2015: EF 65-70%, no RWMAs, G1DD, triv TR/PR; b.) TTE 02/10/2016: EF >55%, no RWMAs, G1DD, triv AR/MR, mild TR/PR; c.) TTE 01/05/2019: EF 60-65%, mild LVH, no RWMAs,  G1DD; d.) TTE 08/18/2021: EF >55%, no RWMAs, G1DD, triv PR, mild MR/TR; e.) TTE 06/22/2023: EF >55%, no RWMAs, G1DD, triv AR/TR/PR, mild MR   GERD (gastroesophageal reflux disease)    Herpes simplex    History of coronary angioplasty with insertion of stent 2009   a.) Total of 6 coronary stents as of 07/26/2023   History of hiatal hernia    History of kidney stones    Hyperlipidemia     Hypertension    Hypothyroidism    Intractable nausea and vomiting 06/09/2015   Kyphoscoliosis    Long-term use of aspirin therapy    Malignant fibrous histiocytoma (posterior neck) 1986   Obstructive sleep apnea    a.) no nocturnal PAP therapy   On long term clopidogrel therapy    Peripheral neuropathy    Peripheral vascular disease (HCC)    Pulmonary hypertension (HCC) 05/10/2019   a.) RHC 05/10/2019: mRA 0, mPA 12, mPWCP 0, AO sat 98, PA sat 72.2, CO 3.74, CI 2.04   Status post bilateral cataract extraction    Thrombocytopenia (HCC)    Type 2 diabetes mellitus without complication (HCC)    Vertigo    Vitamin B 12 deficiency      Transfusion: None   Consultants (if any):   Discharged Condition: Improved  Hospital Course: Dorothy Rogers is an 73 y.o. female who was admitted 07/28/2023 with a diagnosis of S/P total left hip arthroplasty and went to the operating room on 07/28/2023 and underwent the above named procedures.    Surgeries: Procedure(s): TOTAL HIP ARTHROPLASTY ANTERIOR APPROACH on 07/28/2023 Patient tolerated the surgery well. Taken to PACU where she was stabilized and then transferred to the orthopedic floor.  Started on aspirin and Plavix.  TEDs and SCDs applied bilaterally. Heels elevated on bed. No evidence of DVT. Negative Homan. Physical therapy started on day #1 for gait training and transfer. OT started day #1 for ADL and assisted devices.  Day of surgery patient had some irritation to her right eye, she is found to have a corneal abrasion and started on ketorolac eyedrops and antibiotic drops.  On postop day 1 patient's eye pain had resolved and patient asymptomatic.  Patient's IV was d/c on day #1. Patient was able to safely and independently complete all PT goals. PT recommending discharge to home.  Patient's hemoglobin down to 8.0 on postop day 1.  Baseline hemoglobin at 10.  She is on iron supplements for iron deficiency anemia, will continue with iron  supplement.  Patient asymptomatic.  Vital signs are stable.  No swelling throughout her left thigh.  On post op day #1 patient was stable and ready for discharge to home with home health PT.  Implants: Trident Tritanium Clusterhole 48/D w/x2 screws    Liner: Neutral X3 poly 36/D  Stem: Insignia #4 std offset  Head:Biolox ceramic 36 -2.70mm   She was given perioperative antibiotics:  Anti-infectives (From admission, onward)    Start     Dose/Rate Route Frequency Ordered Stop   07/28/23 1330  ceFAZolin (ANCEF) IVPB 2g/100 mL premix        2 g 200 mL/hr over 30 Minutes Intravenous Every 6 hours 07/28/23 1159 07/28/23 2022   07/28/23 0600  ceFAZolin (ANCEF) IVPB 2g/100 mL premix        2 g 200 mL/hr over 30 Minutes Intravenous On call to O.R. 07/27/23 2228 07/28/23 0732     .  She was given sequential compression devices, early ambulation, and teds, aspirin  and Plavix for DVT prophylaxis.  She benefited maximally from the hospital stay and there were no complications.    Recent vital signs:  Vitals:   07/28/23 2355 07/29/23 0446  BP: (!) 134/50 (!) 122/58  Pulse: 88 70  Resp: 14 12  Temp: 98.8 F (37.1 C) 98.1 F (36.7 C)  SpO2: 98% 96%    Recent laboratory studies:  Lab Results  Component Value Date   HGB 8.0 (L) 07/29/2023   HGB 10.7 (L) 07/19/2023   HGB 11.5 (L) 08/07/2022   Lab Results  Component Value Date   WBC 12.0 (H) 07/29/2023   PLT 154 07/29/2023   No results found for: "INR" Lab Results  Component Value Date   NA 133 (L) 07/29/2023   K 5.1 07/29/2023   CL 107 07/29/2023   CO2 19 (L) 07/29/2023   BUN 46 (H) 07/29/2023   CREATININE 2.11 (H) 07/29/2023   GLUCOSE 375 (H) 07/29/2023    Discharge Medications:   Allergies as of 07/29/2023       Reactions   Sildenafil Anaphylaxis   Tramadol Swelling   Beta Adrenergic Blockers Hives   Celecoxib Other (See Comments)   A lot of pressure/tightness in head   Duloxetine    Other Reaction(s): Jittery    Gabapentin Other (See Comments)   Other Reaction(s): dizziness, arms weak with higher doses only   Heparin Rash   Hydrocodone-acetaminophen Other (See Comments)   Other Reaction(s): Hypoglycemic episodes   Indomethacin    Other Reaction(s): "I felt like she was having a heart attack"   Niacin And Related Other (See Comments)   Other Reaction(s): Flushing and hot sensation   Shellfish Allergy Nausea And Vomiting   Vicodin [hydrocodone-acetaminophen] Other (See Comments)   Hypoglycemic episodes   Carbamazepine Nausea Only, Rash   Oxycodone Nausea Only   Other Reaction(s): GI Intolerance        Medication List     STOP taking these medications    aspirin EC 81 MG tablet Replaced by: aspirin 81 MG chewable tablet   ondansetron 8 MG disintegrating tablet Commonly known as: ZOFRAN-ODT       TAKE these medications    acetaminophen 500 MG tablet Commonly known as: TYLENOL Take 1,000 mg by mouth every 6 (six) hours as needed for mild pain or moderate pain.   acyclovir 400 MG tablet Commonly known as: ZOVIRAX Take 400 mg by mouth daily as needed (for fever blisters).   amitriptyline 10 MG tablet Commonly known as: ELAVIL Take 10 mg by mouth at bedtime.   aspirin 81 MG chewable tablet Chew 1 tablet (81 mg total) by mouth 2 (two) times daily. Replaces: aspirin EC 81 MG tablet   atorvastatin 40 MG tablet Commonly known as: LIPITOR Take 40 mg by mouth at bedtime.   Biotin 10 MG Tabs Take 10 mg by mouth daily.   Calcium Carbonate-Vitamin D 600-400 MG-UNIT tablet Take 1 tablet by mouth daily.   clopidogrel 75 MG tablet Commonly known as: PLAVIX Take 75 mg by mouth daily.   diltiazem 180 MG 24 hr capsule Commonly known as: CARDIZEM CD Take 180 mg by mouth daily.   docusate sodium 100 MG capsule Commonly known as: COLACE Take 1 capsule (100 mg total) by mouth 2 (two) times daily.   ezetimibe 10 MG tablet Commonly known as: ZETIA Take 1 tablet (10 mg total) by  mouth daily. What changed: when to take this   ferrous sulfate 325 (65 FE) MG tablet Take  325 mg by mouth daily with breakfast.   glipiZIDE 10 MG tablet Commonly known as: GLUCOTROL Take 10 mg by mouth 2 (two) times daily.   insulin lispro 100 UNIT/ML KwikPen Commonly known as: HUMALOG Inject 0-15 Units into the skin 3 (three) times daily as needed (high blood sugar).   Lantus SoloStar 100 UNIT/ML Solostar Pen Generic drug: insulin glargine Inject 36 Units into the skin daily.   levothyroxine 50 MCG tablet Commonly known as: SYNTHROID Take 50 mcg by mouth daily before breakfast.   LORazepam 1 MG tablet Commonly known as: ATIVAN Take 1 mg by mouth every 8 (eight) hours as needed for anxiety.   losartan 100 MG tablet Commonly known as: COZAAR Take 100 mg by mouth daily.   ondansetron 4 MG tablet Commonly known as: ZOFRAN Take 1 tablet (4 mg total) by mouth every 6 (six) hours as needed for nausea.   oxyCODONE 5 MG immediate release tablet Commonly known as: Oxy IR/ROXICODONE Take 0.5-1 tablets (2.5-5 mg total) by mouth every 4 (four) hours as needed for severe pain (pain score 7-10).   pantoprazole 20 MG tablet Commonly known as: PROTONIX Take 40 mg by mouth daily.   Probiotic Caps Take 1 capsule by mouth daily.   trimethoprim-polymyxin b ophthalmic solution Commonly known as: POLYTRIM Place 1 drop into the right eye every 6 (six) hours for 5 days.   Vitamin D-3 125 MCG (5000 UT) Tabs Take 5,000 Units by mouth daily.               Durable Medical Equipment  (From admission, onward)           Start     Ordered   07/28/23 1512  For home use only DME Bedside commode  Once       Question:  Patient needs a bedside commode to treat with the following condition  Answer:  Impaired mobility   07/28/23 1511   07/28/23 1512  For home use only DME Walker rolling  Once       Question Answer Comment  Walker: With 5 Inch Wheels   Patient needs a walker to  treat with the following condition Impaired mobility      07/28/23 1511            Diagnostic Studies: DG HIP UNILAT WITH PELVIS 2-3 VIEWS LEFT Result Date: 07/28/2023 CLINICAL DATA:  [id number] Surgery, elective [id number] EXAM: DG HIP (WITH OR WITHOUT PELVIS) 2-3V LEFT COMPARISON:  None Available. FINDINGS: Three fluoroscopic spot views of the pelvis and hip obtained in the operating room. Images during hip arthroplasty. Fluoroscopy time 16 seconds. Dose 1.66 mGy. IMPRESSION: Intraoperative fluoroscopy during hip arthroplasty. Electronically Signed   By: Narda Rutherford M.D.   On: 07/28/2023 10:47   DG C-Arm 1-60 Min-No Report Result Date: 07/28/2023 Fluoroscopy was utilized by the requesting physician.  No radiographic interpretation.   DG C-Arm 1-60 Min-No Report Result Date: 07/28/2023 Fluoroscopy was utilized by the requesting physician.  No radiographic interpretation.    Disposition: Discharge disposition: 06-Home-Health Care Svc          Follow-up Information     Evon Slack, PA-C Follow up in 2 week(s).   Specialties: Orthopedic Surgery, Emergency Medicine Contact information: [address] [place] [address] [phone]                  Signed: Evon Slack 07/29/2023, 8:07 AM

## 2023-07-29 NOTE — Progress Notes (Signed)
 Nsg Discharge Note  Admit Date:  07/28/2023 Discharge date: 07/29/2023   LOVEAH LIKE to be D/C'd Home per MD order.  AVS completed.  Patient/caregiver able to verbalize understanding.  Discharge Medication: Allergies as of 07/29/2023       Reactions   Sildenafil Anaphylaxis   Tramadol Swelling   Beta Adrenergic Blockers Hives   Celecoxib Other (See Comments)   A lot of pressure/tightness in head   Duloxetine    Other Reaction(s): Jittery   Gabapentin Other (See Comments)   Other Reaction(s): dizziness, arms weak with higher doses only   Heparin Rash   Hydrocodone-acetaminophen Other (See Comments)   Other Reaction(s): Hypoglycemic episodes   Indomethacin    Other Reaction(s): "I felt like she was having a heart attack"   Niacin And Related Other (See Comments)   Other Reaction(s): Flushing and hot sensation   Shellfish Allergy Nausea And Vomiting   Vicodin [hydrocodone-acetaminophen] Other (See Comments)   Hypoglycemic episodes   Carbamazepine Nausea Only, Rash   Oxycodone Nausea Only   Other Reaction(s): GI Intolerance        Medication List     STOP taking these medications    aspirin EC 81 MG tablet Replaced by: aspirin 81 MG chewable tablet   ondansetron 8 MG disintegrating tablet Commonly known as: ZOFRAN-ODT       TAKE these medications    acetaminophen 500 MG tablet Commonly known as: TYLENOL Take 1,000 mg by mouth every 6 (six) hours as needed for mild pain or moderate pain.   acyclovir 400 MG tablet Commonly known as: ZOVIRAX Take 400 mg by mouth daily as needed (for fever blisters).   amitriptyline 10 MG tablet Commonly known as: ELAVIL Take 10 mg by mouth at bedtime.   aspirin 81 MG chewable tablet Chew 1 tablet (81 mg total) by mouth 2 (two) times daily. Replaces: aspirin EC 81 MG tablet   atorvastatin 40 MG tablet Commonly known as: LIPITOR Take 40 mg by mouth at bedtime.   Biotin 10 MG Tabs Take 10 mg by mouth daily.   Calcium  Carbonate-Vitamin D 600-400 MG-UNIT tablet Take 1 tablet by mouth daily.   clopidogrel 75 MG tablet Commonly known as: PLAVIX Take 75 mg by mouth daily.   diltiazem 180 MG 24 hr capsule Commonly known as: CARDIZEM CD Take 180 mg by mouth daily.   docusate sodium 100 MG capsule Commonly known as: COLACE Take 1 capsule (100 mg total) by mouth 2 (two) times daily.   ezetimibe 10 MG tablet Commonly known as: ZETIA Take 1 tablet (10 mg total) by mouth daily. What changed: when to take this   ferrous sulfate 325 (65 FE) MG tablet Take 325 mg by mouth daily with breakfast.   glipiZIDE 10 MG tablet Commonly known as: GLUCOTROL Take 10 mg by mouth 2 (two) times daily.   insulin lispro 100 UNIT/ML KwikPen Commonly known as: HUMALOG Inject 0-15 Units into the skin 3 (three) times daily as needed (high blood sugar).   Lantus SoloStar 100 UNIT/ML Solostar Pen Generic drug: insulin glargine Inject 36 Units into the skin daily.   levothyroxine 50 MCG tablet Commonly known as: SYNTHROID Take 50 mcg by mouth daily before breakfast.   LORazepam 1 MG tablet Commonly known as: ATIVAN Take 1 mg by mouth every 8 (eight) hours as needed for anxiety.   losartan 100 MG tablet Commonly known as: COZAAR Take 100 mg by mouth daily.   ondansetron 4 MG tablet Commonly known  as: ZOFRAN Take 1 tablet (4 mg total) by mouth every 6 (six) hours as needed for nausea.   oxyCODONE 5 MG immediate release tablet Commonly known as: Oxy IR/ROXICODONE Take 0.5-1 tablets (2.5-5 mg total) by mouth every 4 (four) hours as needed for severe pain (pain score 7-10).   pantoprazole 20 MG tablet Commonly known as: PROTONIX Take 40 mg by mouth daily.   Probiotic Caps Take 1 capsule by mouth daily.   trimethoprim-polymyxin b ophthalmic solution Commonly known as: POLYTRIM Place 1 drop into the right eye every 6 (six) hours for 5 days.   Vitamin D-3 125 MCG (5000 UT) Tabs Take 5,000 Units by mouth  daily.               Durable Medical Equipment  (From admission, onward)           Start     Ordered   07/28/23 1512  For home use only DME Bedside commode  Once       Question:  Patient needs a bedside commode to treat with the following condition  Answer:  Impaired mobility   07/28/23 1511   07/28/23 1512  For home use only DME Walker rolling  Once       Question Answer Comment  Walker: With 5 Inch Wheels   Patient needs a walker to treat with the following condition Impaired mobility      07/28/23 1511            Discharge Assessment: Vitals:   07/29/23 0810 07/29/23 1415  BP: (!) 125/55 (!) 140/64  Pulse: 87 82  Resp: 15 17  Temp: 98.5 F (36.9 C) 98 F (36.7 C)  SpO2: 92% 99%   Skin clean, dry and intact without evidence of skin break down, no evidence of skin tears noted. IV catheter discontinued intact. Site without signs and symptoms of complications - no redness or edema noted at insertion site, patient denies c/o pain - only slight tenderness at site.  Dressing with slight pressure applied.  D/c Instructions-Education: Discharge instructions given to patient/family with verbalized understanding. D/c education completed with patient/family including follow up instructions, medication list, d/c activities limitations if indicated, with other d/c instructions as indicated by MD - patient able to verbalize understanding, all questions fully answered. Patient instructed to return to ED, call 911, or call MD for any changes in condition.  Patient escorted via WC, and D/C home via private auto.  Theodore Demark, RN 07/29/2023 2:24 PM

## 2023-08-17 ENCOUNTER — Other Ambulatory Visit: Payer: Self-pay | Admitting: Cardiovascular Disease

## 2023-12-15 NOTE — Progress Notes (Signed)
 Dorothy Rogers is a  73 y.o. female who presents for  CHIEF COMPLAINT Chief Complaint  Patient presents with  . Follow-up  . Hypertension  . Hyperlipidemia  . Hypothyroidism  . Diabetes    Subjective: History of Present Illness Pt in NAD. HTN stable on meds. Has HLD on statin, DM on insulin  and thryoid dz on Synthroid . Also with CKD. Weight stable. Fatigued and SOB. No fever. Denies CP. Some palpitations. No LE edema. No N/V or obvious bleeding.    Past Medical History:  Diagnosis Date  . Anemia   . Asthma (HHS-HCC) 07/31/2019  . Chronic pain   . Coronary artery disease    stents x 6  . Degenerative disk disease   . Diabetes mellitus type 2, uncomplicated (CMS/HHS-HCC)   . Encounter for blood transfusion    no reaction  . History of degenerative disc disease   . Hyperlipemia   . Hypertension   . Kidney disease   . Malignant fibrous histiocytoma (CMS/HHS-HCC)   . Motion sickness   . OSA (obstructive sleep apnea) 12/07/2017  . Peripheral neuropathy   . Peripheral vascular disease ()   . Transient alteration of awareness    transient after a surgery - didn't recognize anyone including family and spouse.  resolved quickly.   Patient Active Problem List  Diagnosis  . CAD (coronary artery disease)  . Hyperlipemia  . Anemia  . Peripheral neuropathy  . Obesity  . S/P lumbar fusion  . Intervertebral lumbar disc disorder with myelopathy, lumbar region  . Spinal stenosis, lumbar region, with neurogenic claudication  . Degeneration of lumbar intervertebral disc  . CKD (chronic kidney disease) stage 3, GFR 30-59 ml/min (CMS/HHS-HCC)  . Thrombocytopenia (CMS-HCC)  . H/O heart artery stent  . Diabetic peripheral vascular disease (CMS/HHS-HCC)  . Type 2 diabetes mellitus with diabetic neuropathy (CMS/HHS-HCC)  . Essential hypertension, benign  . History of paroxysmal supraventricular tachycardia  . Gastroparesis due to DM  (CMS/HHS-HCC)  . Bilateral sciatica  . Chronic  pain  . OSA (obstructive sleep apnea)  . Elevated erythrocyte sedimentation rate  . Chronic left hip pain  . Esophageal dysphagia  . Asthma (HHS-HCC)  . Vaginal atrophy  . Postlaminectomy syndrome, not elsewhere classified  . Acquired hypothyroidism  . S/P total left hip arthroplasty    Past Surgical History:  Procedure Laterality Date  . fibrohistiocytoma excision  1986  . TOOTH EXTRACTION  01/2013  . POSTERIOR LUMBAR SPINE FUSION ONE LEVEL Bilateral 03/27/2013   Procedure: BILATERAL LUMBAR DECOMPRESSION, STABILIZATION SPINAL FUSION L4-5, EXCISION DISC HERNIATION L4-5 RIGHT.;  Surgeon: Debby Lorrene Kaufmann, MD;  Location: [facility] OR;  Service: Orthopedics;  Laterality: Bilateral;  . INSTRUMENTATION POSTERIOR SPINE 1/2 VERTEBRAL SEGMENTS W/O FIXATION Bilateral 03/27/2013   Procedure: 22840;  Surgeon: Debby Lorrene Kaufmann, MD;  Location: [facility] OR;  Service: Orthopedics;  Laterality: Bilateral;  . LAMINECTOMY POSTERIOR LUMBAR FACETECTOMY & FORAMINOTOMY W/DECOMP Bilateral 03/27/2013   Procedure: BILATERAL LUMBAR DECOMPRESSION L3-4, EXCISION DISC HERNIATION L3-4 RIGHT;  Surgeon: Debby Lorrene Kaufmann, MD;  Location: [facility] OR;  Service: Orthopedics;  Laterality: Bilateral;  . LAMINECTOMY POSTERIOR CERVICLE DECOMP W/FACETECTOMY & FORAMINOTOMY Bilateral 03/27/2013   Procedure: 36951 X 2;  Surgeon: Debby Lorrene Kaufmann, MD;  Location: [facility] OR;  Service: Orthopedics;  Laterality: Bilateral;  . EXPLORATION SPINAL FUSION Bilateral 03/27/2013   Procedure: BILATERAL FUSION EXPLORATION,;  Surgeon: Debby Lorrene Kaufmann, MD;  Location: [facility] OR;  Service: Orthopedics;  Laterality: Bilateral;  . AUTOGRAFT STRUCTURAL ICBG OBTAINED SEPARATE INCISION FOR  SPINE SURGERY Right 03/27/2013   Procedure: RIGHT ICBG;  Surgeon: Debby Lorrene Kaufmann, MD;  Location: [facility] OR;  Service: Orthopedics;  Laterality: Right;  . LAMINECTOMY LUMBAR EXCISON/EVACUATION EXTRADURAL INTRASPINAL LESION Bilateral 03/27/2013   Procedure: LAMINECTOMY  LUMBAR EXCISON/EVACUATION EXTRADURAL INTRASPINAL LESION;  Surgeon: Debby Lorrene Kaufmann, MD;  Location: [facility] OR;  Service: Orthopedics;  Laterality: Bilateral;  . EYE SURGERY Left 2015   cataract  . CATARACT EXTRACTION Left 11/14/2013  . POSTERIOR LUMBAR SPINE FUSION ONE LEVEL Bilateral 05/09/2014   Procedure: BILATERAL REEXPLORATION LUMBAR DECOMPRESSION, STABILIZATION SPINAL FUSION L3-4;  Surgeon: Debby Lorrene Kaufmann, MD;  Location: [facility] OR;  Service: Orthopedics;  Laterality: Bilateral;  . INSTRUMENTATION POSTERIOR SPINE 1/2 VERTEBRAL SEGMENTS W/O FIXATION Bilateral 05/09/2014   Procedure: INSTRUMENTATION POSTERIOR SPINE 1/2 VERTEBRAL SEGMENTS W/O FIXATION;  Surgeon: Debby Lorrene Kaufmann, MD;  Location: [facility] OR;  Service: Orthopedics;  Laterality: Bilateral;  . LAMINOTOMY REEXPLORATION POSTERIOR LUMBAR W/NERVE DECOMP & DISCECTOMY Bilateral 05/09/2014   Procedure: 36957-49 ;  Surgeon: Debby Lorrene Kaufmann, MD;  Location: [facility] OR;  Service: Orthopedics;  Laterality: Bilateral;  . LAMINOTOMY REEXPLORATION POSTERIOR LUMBAR W/NERVE DECOMP & DISCECTOMY Bilateral 05/09/2014   Procedure: 36955-49 X 2;  Surgeon: Debby Lorrene Kaufmann, MD;  Location: [facility] OR;  Service: Orthopedics;  Laterality: Bilateral;  . EXPLORATION SPINAL FUSION Bilateral 05/09/2014   Procedure: EXPLORATION SPINAL FUSION;  Surgeon: Debby Lorrene Kaufmann, MD;  Location: [facility] OR;  Service: Orthopedics;  Laterality: Bilateral;  . REMOVAL POSTERIOR SEGMENTAL LUMBAR/THORACIC SPINAL HARDWARE Bilateral 05/09/2014   Procedure: METAL REMOVAL (EXPEDIUM) REEXPLORATION DECOMPRESSION/ FUSION EXPLORATION L4-5 ;  Surgeon: Debby Lorrene Kaufmann, MD;  Location: [facility] OR;  Service: Orthopedics;  Laterality: Bilateral;  . AUTOGRAFT STRUCTURAL ICBG OBTAINED SEPARATE INCISION FOR SPINE SURGERY Bilateral 05/09/2014   Procedure: ICBG;  Surgeon: Debby Lorrene Kaufmann, MD;  Location: [facility] OR;  Service: Orthopedics;  Laterality: Bilateral;  . EYE SURGERY Right 03/2015   cataract   . POSTERIOR LUMBAR SPINE FUSION ONE LEVEL LATERAL TRANSVERSE TECHNIQUE Bilateral 05/30/2015   Procedure: BILATERAL LUMBAR DECOMPRESSION, STABILIZATION SPINAL FUSION L2-3; EXCISION CENTRAL LEFT DISC HERNIATION L2-3; LEFT ICBG; METAL REMOVAL, REEXPLORATION LUMBAR DECOMPRESSION, FUSION EXPLORATION L3-4, L4-5;  Surgeon: Debby Lorrene Kaufmann, MD;  Location: [facility] OR;  Service: Orthopedics;  Laterality: Bilateral;  . INSTRUMENTATION POSTERIOR SPINE 3 TO 6 VERTEBRAL SEGMENTS Bilateral 05/30/2015   Procedure: POSTERIOR SEGMENTAL INSTRUMENTATION (EG, PEDICLE FIXATION, DUAL RODS WITH MULTIPLE HOOKS AND SUBLAMINAR WIRES); 3 TO 6 VERTEBRAL SEGMENTS (LIST IN ADDITION TO PRIMARY PROCEDURE);  Surgeon: Debby Lorrene Kaufmann, MD;  Location: [facility] OR;  Service: Orthopedics;  Laterality: Bilateral;  . LAMINOTOMY REEXPLORATION POSTERIOR LUMBAR W/NERVE DECOMP & DISCECTOMY Bilateral 05/30/2015   Procedure: LAMINOTOMY REEXPLORATION POSTERIOR LUMBAR W/NERVE DECOMP & DISCECTOMY; [id number];  Surgeon: Debby Lorrene Kaufmann, MD;  Location: [facility] OR;  Service: Orthopedics;  Laterality: Bilateral;  . LAMINOTOMY REEXPLORATION POSTERIOR LUMBAR W/NERVE DECOMP & DISCECTOMY Bilateral 05/30/2015   Procedure: LAMINOTOMY REEXPLORATION EACH ADDITIONAL LUMBAR INTERSPACE; 36955-49K7;  Surgeon: Debby Lorrene Kaufmann, MD;  Location: [facility] OR;  Service: Orthopedics;  Laterality: Bilateral;  . REMOVAL POSTERIOR SEGMENTAL LUMBAR/THORACIC SPINAL HARDWARE Bilateral 05/30/2015   Procedure: REMOVAL OF POSTERIOR SEGMENTAL THORACIC / LUMBAR  INSTRUMENTATION;  Surgeon: Debby Lorrene Kaufmann, MD;  Location: [facility] OR;  Service: Orthopedics;  Laterality: Bilateral;  . EXPLORATION SPINAL FUSION Bilateral 05/30/2015   Procedure: EXPLORATION OF SPINAL FUSION;  Surgeon: Debby Lorrene Kaufmann, MD;  Location: [facility] OR;  Service: Orthopedics;  Laterality: Bilateral;  . AUTOGRAFT STRUCTURAL ICBG OBTAINED SEPARATE INCISION FOR SPINE SURGERY Left 05/30/2015  Procedure: AUTOGRAFT FOR SPINE  SURGERY ONLY (INCL HARVESTING GRAFT); STRUCTURAL, BICORTICAL OR TRICORTICAL (THROUGH SEPARATE SKIN OR FASCIAL INCISION) (LIST IN ADDITION TO PRIMARY PROCEDURE);  Surgeon: Debby Lorrene Kaufmann, MD;  Location: [facility] OR;  Service: Orthopedics;  Laterality: Left;  . REPAIR HIATAL HERNIA  02/2016  . CHOLECYSTECTOMY  04/2016  . POSTERIOR LUMBAR SPINE FUSION ONE LEVEL LATERAL TRANSVERSE TECHNIQUE Bilateral 01/21/2017   Procedure: BILATERAL LUMBAR DECOMPRESSION L1-2, L2-3, L3-4,;  Surgeon: Kaufmann Debby Lorrene, MD;  Location: [facility] OR;  Service: Orthopedics;  Laterality: Bilateral;  . INSTRUMENTATION POSTERIOR SPINE 3 TO 6 VERTEBRAL SEGMENTS Bilateral 01/21/2017   Procedure: SPINAL FUSION / INSTRUMENTATION L1-2;  Surgeon: Kaufmann Debby Lorrene, MD;  Location: [facility] OR;  Service: Orthopedics;  Laterality: Bilateral;  . LAMINECTOMY POSTERIOR LUMBAR FACETECTOMY & FORAMINOTOMY W/DECOMP Bilateral 01/21/2017   Procedure: 36952 ;  Surgeon: Kaufmann Debby Lorrene, MD;  Location: [facility] OR;  Service: Orthopedics;  Laterality: Bilateral;  . LAMINECTOMY POSTERIOR CERVICLE DECOMP W/FACETECTOMY & FORAMINOTOMY Bilateral 01/21/2017   Procedure: 36951 ;  Surgeon: Kaufmann Debby Lorrene, MD;  Location: [facility] OR;  Service: Orthopedics;  Laterality: Bilateral;  . EXPLORATION SPINAL FUSION Bilateral 01/21/2017   Procedure: 22830;  Surgeon: Kaufmann Debby Lorrene, MD;  Location: [facility] OR;  Service: Orthopedics;  Laterality: Bilateral;  . REMOVAL POSTERIOR SEGMENTAL LUMBAR/THORACIC SPINAL HARDWARE Bilateral 01/21/2017   Procedure: METAL REMOVAL / FUSION EXPLORATION L2-3, L3-4;  Surgeon: Kaufmann Debby Lorrene, MD;  Location: [facility] OR;  Service: Orthopedics;  Laterality: Bilateral;  . INSERTION MORSELIZED BONE ALLOGRAFT FOR SPINE SURGERY N/A 01/21/2017   Procedure: LOCAL AUTOGRAFT JOVITA MOELLER;  Surgeon: Kaufmann Debby Lorrene, MD;  Location: [facility] OR;  Service: Orthopedics;  Laterality: N/A;  . COLPORRHAPHY FOR REPAIR RECTOCELE POSTERIOR N/A  07/13/2021   Procedure: Posterior repair with or without perineorrhaphy;  Surgeon: Lee Dorthea Kay, MD;  Location: [facility] OR;  Service: Gynecology;  Laterality: N/A;  . BACK SURGERY    . BONE GRAFT HIP ILIAC CREST    . COLPORRHAPHY FOR REPAIR CYSTOCELE ANTERIOR    . cyst removed     right wrist  . HERNIA REPAIR    . HYSTERECTOMY    . PTCA with stent placement x6    . rectal prolaspe    . TRANSFORAMINAL LUMBAR INTERBODY FUSION MINIMALLY INVASIVE     anterior/posterior     Current Outpatient Medications:  .  acetaminophen  (TYLENOL ) 500 MG tablet, Take 500 mg by mouth every 6 (six) hours as needed for Pain., Disp: , Rfl:  .  acyclovir  (ZOVIRAX ) 400 MG tablet, Take 1 tablet (400 mg total) by mouth once daily, Disp: 90 tablet, Rfl: 3 .  amitriptyline  (ELAVIL ) 10 MG tablet, Take 1 tablet (10 mg total) by mouth at bedtime, Disp: 30 tablet, Rfl: 11 .  atorvastatin  (LIPITOR) 40 MG tablet, Take 1 tablet by mouth once daily, Disp: 90 tablet, Rfl: 0 .  biotin  10 mg Tab, Take 10 mg by mouth once daily Biotin  with zinc, Disp: , Rfl:  .  calcium  carbonate-vitamin D3 (CALTRATE 600+D) 600 mg(1,500mg ) -400 unit tablet, Take 1 tablet by mouth once daily. , Disp: , Rfl:  .  CARTIA  XT 180 mg CD capsule, Take 1 capsule by mouth once daily, Disp: 90 capsule, Rfl: 0 .  clopidogreL  (PLAVIX ) 75 mg tablet, Take 1 tablet by mouth once daily, Disp: 90 tablet, Rfl: 0 .  cyanocobalamin  (VITAMIN B12) 1000 MCG tablet, Take 1,000 mcg by mouth once daily.  , Disp: , Rfl:  .  ezetimibe  (ZETIA )  10 mg tablet, Take 1 tablet by mouth once daily, Disp: , Rfl:  .  ferrous sulfate  325 (65 FE) MG tablet, Take 325 mg by mouth daily with breakfast., Disp: , Rfl:  .  fluticasone propionate (FLONASE) 50 mcg/actuation nasal spray, Place 2 sprays into both nostrils 2 (two) times daily as needed for Rhinitis, Disp: 18 g, Rfl: 2 .  glipiZIDE  (GLUCOTROL ) 10 MG tablet, TAKE 1 TABLET BY MOUTH TWICE DAILY BEFORE A MEAL, Disp: 180 tablet,  Rfl: 0 .  insulin  LISPRO (HUMALOG KWIKPEN) pen injector (concentration 100 units/mL), INJECT 0-15u UNDER THE SKIN THREE TIMES DAILY AS NEEDED, Disp: 15 mL, Rfl: 5 .  LANTUS  SOLOSTAR U-100 INSULIN  pen injector (concentration 100 units/mL), INJECT 30-50 UNITS SUBCUTANEOUSLY ONCE DAILY AS DIRECTED (Patient taking differently: Inject 36 Units subcutaneously once daily INJECT 30-50 UNITS SUBCUTANEOUSLY ONCE DAILY AS DIRECTED), Disp: 30 mL, Rfl: 0 .  levothyroxine  (SYNTHROID ) 50 MCG tablet, Take 1 tablet (50 mcg total) by mouth once daily Take on an empty stomach with a glass of water  at least 30-60 minutes before breakfast., Disp: 30 tablet, Rfl: 11 .  LORazepam  (ATIVAN ) 1 MG tablet, Take 1 tablet (1 mg total) by mouth every 8 (eight) hours as needed for Anxiety, Disp: 30 tablet, Rfl: 0 .  losartan  (COZAAR ) 100 MG tablet, Take 1 tablet by mouth once daily, Disp: 90 tablet, Rfl: 0 .  ondansetron  (ZOFRAN -ODT) 8 MG disintegrating tablet, Take 1 tablet (8 mg total) by mouth every 8 (eight) hours as needed for Nausea, Disp: 90 tablet, Rfl: 0 .  pantoprazole  (PROTONIX ) 20 MG DR tablet, TAKE 1 TABLET BY MOUTH TWICE DAILY BEFORE MEAL(S), Disp: 180 tablet, Rfl: 1 .  pen needle, diabetic 32 gauge x 5/32 Ndle, USE AS DIRECTED, Disp: 150 each, Rfl: 3  Shellfish containing products, Sildenafil, Beta-blockers (beta-adrenergic blocking agts), Heparin , Carbamazepine , Celebrex [celecoxib], Cymbalta [duloxetine], Gabapentin , Indomethacin, Niacin, Oxycodone , Tramadol , Vicodin [hydrocodone -acetaminophen ], and Heparin  analogues  Social History   Socioeconomic History  . Marital status: Married    Spouse name: Christopher  . Number of children: 2  . Years of education: 14  Occupational History  . Occupation: Retired Public house manager  Tobacco Use  . Smoking status: Never  . Smokeless tobacco: Never  Vaping Use  . Vaping status: Never Used  Substance and Sexual Activity  . Alcohol  use: Not Currently    Comment: Rarely  . Drug use: No   . Sexual activity: Not Currently   Social Drivers of Health   Financial Resource Strain: Low Risk  (12/06/2023)   Overall Financial Resource Strain (CARDIA)   . Difficulty of Paying Living Expenses: Not hard at all  Food Insecurity: No Food Insecurity (12/06/2023)   Hunger Vital Sign   . Worried About Programme researcher, broadcasting/film/video in the Last Year: Never true   . Ran Out of Food in the Last Year: Never true  Transportation Needs: No Transportation Needs (12/06/2023)   PRAPARE - Transportation   . Lack of Transportation (Medical): No   . Lack of Transportation (Non-Medical): No  Stress: No Stress Concern Present (01/22/2022)   Received from [facility] Occupational [facility] - Occupational Stress Questionnaire   . Feeling of Stress : Not at all  Social Connections: Moderately Isolated (07/28/2023)   Received from [facility]   Social Connection and Isolation Panel   . In a typical week, how many times do you talk on the phone with family, friends, or neighbors?: More than three times  a week   . How often do you get together with friends or relatives?: Never   . How often do you attend church or religious services?: Never   . Do you belong to any clubs or organizations such as church groups, unions, fraternal or athletic groups, or school groups?: No   . How often do you attend meetings of the clubs or organizations you belong to?: Never   . Are you married, widowed, divorced, separated, never married, or living with a partner?: Married  Housing Stability: Low Risk  (12/06/2023)   Housing Stability Vital Sign   . Unable to Pay for Housing in the Last Year: No   . Number of Times Moved in the Last Year: 0   . Homeless in the Last Year: No    Family History  Problem Relation Name Age of Onset  . Arthritis Mother    . Cancer Mother    . Coronary Artery Disease (Blocked arteries around heart) Father    . Myocardial Infarction (Heart attack) Father    . Thyroid  disease Sister     . Arthritis Sister    . Asthma Brother    . Diabetes Maternal Grandmother    . Anesthesia problems Neg Hx      A comprehensive ROS was negative except for HPI  PE: BP (!) 142/72 (BP Location: Left upper arm, Patient Position: Sitting, BP Cuff Size: Small Adult)   Pulse 96   Ht 157.5 cm (5' 2)   Wt 78.5 kg (173 lb)   LMP  (LMP Unknown)   SpO2 99%   BMI 31.64 kg/m  General: Alert oriented x3    Eyes: Sclera and conjunctiva clear; pupils equal round and reactive to light and accommodation; extraocular movements intact Nose: Mucosa healthy without drainage or ulceration Oropharynx: No suspicious lesions Neck: No swelling, masses, stiffness, pain, limited movement, carotid pulses normal bilaterally, thyroid  normal size, no masses palpated. No bruits heard. Lungs: Respirations unlabored; clear to auscultation bilaterally Back: No spinal deformity Cardiovascular: Heart regular rate and rhythm without murmurs, gallops, or rubs Abdomen: Soft; non tender; non distended;  no masses or organomegaly Lymph Nodes: No significant cervical, supraclavicular, or axillary lymphadenopathy noted Musculoskeletal: No active joint inflammation Extremities: Normal, no edema Pulses: Dorsalis pedis palpable and symmetric bilaterally Neurologic: Alert and oriented; speech intact; face symmetrical; moves all extremities well    Appointment on 11/08/2023  Component Date Value Ref Range Status  . WBC (White Blood Cell Count) 11/08/2023 8.4  4.1 - 10.2 10^3/uL Final  . RBC (Red Blood Cell Count) 11/08/2023 3.16 (L)  4.04 - 5.48 10^6/uL Final  . Hemoglobin 11/08/2023 9.5 (L)  12.0 - 15.0 gm/dL Final  . Hematocrit 93/89/7974 29.4 (L)  35.0 - 47.0 % Final  . MCV (Mean Corpuscular Volume) 11/08/2023 93.0  80.0 - 100.0 fl Final  . MCH (Mean Corpuscular Hemoglobin) 11/08/2023 30.1  27.0 - 31.2 pg Final  . MCHC (Mean Corpuscular Hemoglobin * 11/08/2023 32.3  32.0 - 36.0 gm/dL Final  . Platelet Count 11/08/2023  206  150 - 450 10^3/uL Final  . RDW-CV (Red Cell Distribution Widt* 11/08/2023 13.9  11.6 - 14.8 % Final  . MPV (Mean Platelet Volume) 11/08/2023 9.9  9.4 - 12.4 fl Final  . Neutrophils 11/08/2023 5.65  1.50 - 7.80 10^3/uL Final  . Lymphocytes 11/08/2023 2.02  1.00 - 3.60 10^3/uL Final  . Monocytes 11/08/2023 0.60  0.00 - 1.50 10^3/uL Final  . Eosinophils 11/08/2023 0.06  0.00 - 0.55  10^3/uL Final  . Basophils 11/08/2023 0.05  0.00 - 0.09 10^3/uL Final  . Neutrophil % 11/08/2023 67.4  32.0 - 70.0 % Final  . Lymphocyte % 11/08/2023 24.0  10.0 - 50.0 % Final  . Monocyte % 11/08/2023 7.1  4.0 - 13.0 % Final  . Eosinophil % 11/08/2023 0.7 (L)  1.0 - 5.0 % Final  . Basophil% 11/08/2023 0.6  0.0 - 2.0 % Final  . Immature Granulocyte % 11/08/2023 0.2  <=0.7 % Final  . Immature Granulocyte Count 11/08/2023 0.02  <=0.06 10^3/L Final  Appointment on 10/10/2023  Component Date Value Ref Range Status  . WBC (White Blood Cell Count) 10/10/2023 7.9  4.1 - 10.2 10^3/uL Final  . RBC (Red Blood Cell Count) 10/10/2023 3.16 (L)  4.04 - 5.48 10^6/uL Final  . Hemoglobin 10/10/2023 9.5 (L)  12.0 - 15.0 gm/dL Final  . Hematocrit 94/87/7974 29.8 (L)  35.0 - 47.0 % Final  . MCV (Mean Corpuscular Volume) 10/10/2023 94.3  80.0 - 100.0 fl Final  . MCH (Mean Corpuscular Hemoglobin) 10/10/2023 30.1  27.0 - 31.2 pg Final  . MCHC (Mean Corpuscular Hemoglobin * 10/10/2023 31.9 (L)  32.0 - 36.0 gm/dL Final  . Platelet Count 10/10/2023 214  150 - 450 10^3/uL Final  . RDW-CV (Red Cell Distribution Widt* 10/10/2023 13.5  11.6 - 14.8 % Final  . MPV (Mean Platelet Volume) 10/10/2023 10.1  9.4 - 12.4 fl Final  . Neutrophils 10/10/2023 5.58  1.50 - 7.80 10^3/uL Final  . Lymphocytes 10/10/2023 1.60  1.00 - 3.60 10^3/uL Final  . Monocytes 10/10/2023 0.54  0.00 - 1.50 10^3/uL Final  . Eosinophils 10/10/2023 0.12  0.00 - 0.55 10^3/uL Final  . Basophils 10/10/2023 0.06  0.00 - 0.09 10^3/uL Final  . Neutrophil % 10/10/2023 70.4  (H)  32.0 - 70.0 % Final  . Lymphocyte % 10/10/2023 20.2  10.0 - 50.0 % Final  . Monocyte % 10/10/2023 6.8  4.0 - 13.0 % Final  . Eosinophil % 10/10/2023 1.5  1.0 - 5.0 % Final  . Basophil% 10/10/2023 0.8  0.0 - 2.0 % Final  . Immature Granulocyte % 10/10/2023 0.3  <=0.7 % Final  . Immature Granulocyte Count 10/10/2023 0.02  <=0.06 10^3/L Final  Office Visit on 09/08/2023  Component Date Value Ref Range Status  . Magnesium  09/08/2023 1.8  1.8 - 2.5 mg/dL Final  . WBC (White Blood Cell Count) 09/08/2023 7.5  4.1 - 10.2 10^3/uL Final  . RBC (Red Blood Cell Count) 09/08/2023 3.07 (L)  4.04 - 5.48 10^6/uL Final  . Hemoglobin 09/08/2023 9.5 (L)  12.0 - 15.0 gm/dL Final  . Hematocrit 95/89/7974 29.5 (L)  35.0 - 47.0 % Final  . MCV (Mean Corpuscular Volume) 09/08/2023 96.1  80.0 - 100.0 fl Final  . MCH (Mean Corpuscular Hemoglobin) 09/08/2023 30.9  27.0 - 31.2 pg Final  . MCHC (Mean Corpuscular Hemoglobin * 09/08/2023 32.2  32.0 - 36.0 gm/dL Final  . Platelet Count 09/08/2023 220  150 - 450 10^3/uL Final  . RDW-CV (Red Cell Distribution Widt* 09/08/2023 14.0  11.6 - 14.8 % Final  . MPV (Mean Platelet Volume) 09/08/2023 9.9  9.4 - 12.4 fl Final  . Neutrophils 09/08/2023 5.28  1.50 - 7.80 10^3/uL Final  . Lymphocytes 09/08/2023 1.60  1.00 - 3.60 10^3/uL Final  . Monocytes 09/08/2023 0.53  0.00 - 1.50 10^3/uL Final  . Eosinophils 09/08/2023 0.06  0.00 - 0.55 10^3/uL Final  . Basophils 09/08/2023 0.05  0.00 - 0.09 10^3/uL  Final  . Neutrophil % 09/08/2023 70.2 (H)  32.0 - 70.0 % Final  . Lymphocyte % 09/08/2023 21.2  10.0 - 50.0 % Final  . Monocyte % 09/08/2023 7.0  4.0 - 13.0 % Final  . Eosinophil % 09/08/2023 0.8 (L)  1.0 - 5.0 % Final  . Basophil% 09/08/2023 0.7  0.0 - 2.0 % Final  . Immature Granulocyte % 09/08/2023 0.1  <=0.7 % Final  . Immature Granulocyte Count 09/08/2023 0.01  <=0.06 10^3/L Final  . Glucose 09/08/2023 158 (H)  70 - 110 mg/dL Final  . Sodium 95/89/7974 139  136 - 145  mmol/L Final  . Potassium 09/08/2023 5.0  3.6 - 5.1 mmol/L Final  . Chloride 09/08/2023 106  97 - 109 mmol/L Final  . Carbon Dioxide (CO2) 09/08/2023 25.4  22.0 - 32.0 mmol/L Final  . Urea Nitrogen (BUN) 09/08/2023 34 (H)  7 - 25 mg/dL Final  . Creatinine 95/89/7974 1.8 (H)  0.6 - 1.1 mg/dL Final  . Glomerular Filtration Rate (eGFR) 09/08/2023 29 (L)  >60 mL/min/1.73sq m Final  . Calcium  09/08/2023 9.3  8.7 - 10.3 mg/dL Final  . AST  95/89/7974 19  8 - 39 U/L Final  . ALT  09/08/2023 21  5 - 38 U/L Final  . Alk Phos (alkaline Phosphatase) 09/08/2023 123 (H)  34 - 104 U/L Final  . Albumin  09/08/2023 4.1  3.5 - 4.8 g/dL Final  . Bilirubin, Total 09/08/2023 0.5  0.3 - 1.2 mg/dL Final  . Protein, Total 09/08/2023 6.7  6.1 - 7.9 g/dL Final  . A/G Ratio 95/89/7974 1.6  1.0 - 5.0 gm/dL Final  . Thyroid  Stimulating Hormone (TSH) 09/08/2023 1.719  0.450-5.330 uIU/ml uIU/mL Final  . Color 09/08/2023 Light Yellow  Colorless, Straw, Light Yellow, Yellow, Dark Yellow Final  . Clarity 09/08/2023 Cloudy (!)  Clear Final  . Specific Gravity 09/08/2023 1.024  1.005 - 1.030 Final  . pH, Urine 09/08/2023 5.5  5.0 - 8.0 Final  . Protein, Urinalysis 09/08/2023 Negative  Negative mg/dL Final  . Glucose, Urinalysis 09/08/2023 Negative  Negative mg/dL Final  . Ketones, Urinalysis 09/08/2023 Negative  Negative mg/dL Final  . Blood, Urinalysis 09/08/2023 Negative  Negative Final  . Nitrite, Urinalysis 09/08/2023 Negative  Negative Final  . Leukocyte Esterase, Urinalysis 09/08/2023 3+ (!)  Negative Final  . Bilirubin, Urinalysis 09/08/2023 Negative  Negative Final  . Urobilinogen, Urinalysis 09/08/2023 0.2  0.2 - 1.0 mg/dL Final  . WBC, UA 95/89/7974 10 (H)  <=5 /hpf Final  . Red Blood Cells, Urinalysis 09/08/2023 5 (H)  <=3 /hpf Final  . Bacteria, Urinalysis 09/08/2023 0-5  0 - 5 /hpf Final  . Squamous Epithelial Cells, Urinaly* 09/08/2023 8  /hpf Final  . Hemoglobin A1C 09/08/2023 7.6 (H)  4.2 - 5.6 % Final   . Average Blood Glucose (Calc) 09/08/2023 171  mg/dL Final  . Urine Culture, Routine - [facility] 09/09/2023 Final report   Final  . Result 1 - [facility] 09/09/2023 Comment   Final  Ancillary Procedure on 06/22/2023  Component Date Value Ref Range Status  . LV Ejection Fraction (%) 06/22/2023 >55%  % Final  . Right Ventricle Systolic Pressure * 06/22/2023 33  mmHg Final  . Left Atrium Diameter (cm) 06/22/2023 4.0  cm Final  . LV End Diastolic Diameter (cm) 06/22/2023 4.4  cm Final  . LV End Systolic Diameter (cm) 06/22/2023 2.3  cm Final  . LV Septum Wall Thickness (cm) 06/22/2023 0.9  cm Final  . LV  Posterior Wall Thickness (cm) 06/22/2023 1.0  cm Final  . Tricuspid Valve Regurgitation Grade 06/22/2023 trivial   Final  . Tricuspid Valve Regurgitation Max * 06/22/2023 2.7  m/s Final  . Mitral Valve Regurgitation Grade 06/22/2023 mild   Final  . Mitral Valve Stenosis Grade 06/22/2023 none   Final  . Aortic Valve Regurgitation Grade 06/22/2023 trivial   Final  . Aortic Valve Stenosis Grade 06/22/2023 none   Final  Office Visit on 06/16/2023  Component Date Value Ref Range Status  . WBC (White Blood Cell Count) 06/16/2023 6.8  4.1 - 10.2 10^3/uL Final  . RBC (Red Blood Cell Count) 06/16/2023 3.39 (L)  4.04 - 5.48 10^6/uL Final  . Hemoglobin 06/16/2023 11.0 (L)  12.0 - 15.0 gm/dL Final  . Hematocrit 98/83/7974 32.0 (L)  35.0 - 47.0 % Final  . MCV (Mean Corpuscular Volume) 06/16/2023 94.4  80.0 - 100.0 fl Final  . MCH (Mean Corpuscular Hemoglobin) 06/16/2023 32.4 (H)  27.0 - 31.2 pg Final  . MCHC (Mean Corpuscular Hemoglobin * 06/16/2023 34.4  32.0 - 36.0 gm/dL Final  . Platelet Count 06/16/2023 224  150 - 450 10^3/uL Final  . RDW-CV (Red Cell Distribution Widt* 06/16/2023 13.2  11.6 - 14.8 % Final  . MPV (Mean Platelet Volume) 06/16/2023 9.9  9.4 - 12.4 fl Final  . Neutrophils 06/16/2023 4.74  1.50 - 7.80 10^3/uL Final  . Lymphocytes 06/16/2023 1.43  1.00 - 3.60 10^3/uL Final  . Monocytes  06/16/2023 0.48  0.00 - 1.50 10^3/uL Final  . Eosinophils 06/16/2023 0.09  0.00 - 0.55 10^3/uL Final  . Basophils 06/16/2023 0.07  0.00 - 0.09 10^3/uL Final  . Neutrophil % 06/16/2023 69.5  32.0 - 70.0 % Final  . Lymphocyte % 06/16/2023 20.9  10.0 - 50.0 % Final  . Monocyte % 06/16/2023 7.0  4.0 - 13.0 % Final  . Eosinophil % 06/16/2023 1.3  1.0 - 5.0 % Final  . Basophil% 06/16/2023 1.0  0.0 - 2.0 % Final  . Immature Granulocyte % 06/16/2023 0.3  <=0.7 % Final  . Immature Granulocyte Count 06/16/2023 0.02  <=0.06 10^3/L Final  . Glucose 06/16/2023 178 (H)  70 - 110 mg/dL Final  . Sodium 98/83/7974 138  136 - 145 mmol/L Final  . Potassium 06/16/2023 4.9  3.6 - 5.1 mmol/L Final  . Chloride 06/16/2023 105  97 - 109 mmol/L Final  . Carbon Dioxide (CO2) 06/16/2023 22.4  22.0 - 32.0 mmol/L Final  . Urea Nitrogen (BUN) 06/16/2023 32 (H)  7 - 25 mg/dL Final  . Creatinine 98/83/7974 1.9 (H)  0.6 - 1.1 mg/dL Final  . Glomerular Filtration Rate (eGFR) 06/16/2023 28 (L)  >60 mL/min/1.73sq m Final  . Calcium  06/16/2023 9.2  8.7 - 10.3 mg/dL Final  . AST  98/83/7974 16  8 - 39 U/L Final  . ALT  06/16/2023 23  5 - 38 U/L Final  . Alk Phos (alkaline Phosphatase) 06/16/2023 92  34 - 104 U/L Final  . Albumin  06/16/2023 4.1  3.5 - 4.8 g/dL Final  . Bilirubin, Total 06/16/2023 0.5  0.3 - 1.2 mg/dL Final  . Protein, Total 06/16/2023 6.6  6.1 - 7.9 g/dL Final  . A/G Ratio 98/83/7974 1.6  1.0 - 5.0 gm/dL Final  . Color 98/83/7974 Light Yellow  Colorless, Straw, Light Yellow, Yellow, Dark Yellow Final  . Clarity 06/16/2023 Cloudy (!)  Clear Final  . Specific Gravity 06/16/2023 1.019  1.005 - 1.030 Final  . pH, Urine 06/16/2023 5.0  5.0 - 8.0 Final  . Protein, Urinalysis 06/16/2023 Negative  Negative mg/dL Final  . Glucose, Urinalysis 06/16/2023 Negative  Negative mg/dL Final  . Ketones, Urinalysis 06/16/2023 Negative  Negative mg/dL Final  . Blood, Urinalysis 06/16/2023 Negative  Negative Final  .  Nitrite, Urinalysis 06/16/2023 Negative  Negative Final  . Leukocyte Esterase, Urinalysis 06/16/2023 3+ (!)  Negative Final  . Bilirubin, Urinalysis 06/16/2023 Negative  Negative Final  . Urobilinogen, Urinalysis 06/16/2023 0.2  0.2 - 1.0 mg/dL Final  . WBC, UA 98/83/7974 42 (H)  <=5 /hpf Final  . Red Blood Cells, Urinalysis 06/16/2023 22 (H)  <=3 /hpf Final  . Bacteria, Urinalysis 06/16/2023 0-5  0 - 5 /hpf Final  . Squamous Epithelial Cells, Urinaly* 06/16/2023 13  /hpf Final  . Urine Culture, Routine - [facility] 06/16/2023 Final report   Final  . Result 1 - [facility] 06/16/2023 Comment   Final  Appointment on 05/30/2023  Component Date Value Ref Range Status  . WBC (White Blood Cell Count) 05/30/2023 8.8  4.1 - 10.2 10^3/uL Final  . RBC (Red Blood Cell Count) 05/30/2023 3.46 (L)  4.04 - 5.48 10^6/uL Final  . Hemoglobin 05/30/2023 11.0 (L)  12.0 - 15.0 gm/dL Final  . Hematocrit 87/69/7975 33.1 (L)  35.0 - 47.0 % Final  . MCV (Mean Corpuscular Volume) 05/30/2023 95.7  80.0 - 100.0 fl Final  . MCH (Mean Corpuscular Hemoglobin) 05/30/2023 31.8 (H)  27.0 - 31.2 pg Final  . MCHC (Mean Corpuscular Hemoglobin * 05/30/2023 33.2  32.0 - 36.0 gm/dL Final  . Platelet Count 05/30/2023 238  150 - 450 10^3/uL Final  . RDW-CV (Red Cell Distribution Widt* 05/30/2023 13.2  11.6 - 14.8 % Final  . MPV (Mean Platelet Volume) 05/30/2023 10.0  9.4 - 12.4 fl Final  . Neutrophils 05/30/2023 5.94  1.50 - 7.80 10^3/uL Final  . Lymphocytes 05/30/2023 2.00  1.00 - 3.60 10^3/uL Final  . Monocytes 05/30/2023 0.62  0.00 - 1.50 10^3/uL Final  . Eosinophils 05/30/2023 0.13  0.00 - 0.55 10^3/uL Final  . Basophils 05/30/2023 0.04  0.00 - 0.09 10^3/uL Final  . Neutrophil % 05/30/2023 67.8  32.0 - 70.0 % Final  . Lymphocyte % 05/30/2023 22.9  10.0 - 50.0 % Final  . Monocyte % 05/30/2023 7.1  4.0 - 13.0 % Final  . Eosinophil % 05/30/2023 1.5  1.0 - 5.0 % Final  . Basophil% 05/30/2023 0.5  0.0 - 2.0 % Final  . Immature  Granulocyte % 05/30/2023 0.2  <=0.7 % Final  . Immature Granulocyte Count 05/30/2023 0.02  <=0.06 10^3/L Final  . Glucose 05/30/2023 131 (H)  70 - 110 mg/dL Final  . Sodium 87/69/7975 142  136 - 145 mmol/L Final  . Potassium 05/30/2023 4.5  3.6 - 5.1 mmol/L Final  . Chloride 05/30/2023 107  97 - 109 mmol/L Final  . Carbon Dioxide (CO2) 05/30/2023 24.3  22.0 - 32.0 mmol/L Final  . Urea Nitrogen (BUN) 05/30/2023 35 (H)  7 - 25 mg/dL Final  . Creatinine 87/69/7975 1.8 (H)  0.6 - 1.1 mg/dL Final  . Glomerular Filtration Rate (eGFR) 05/30/2023 29 (L)  >60 mL/min/1.73sq m Final  . Calcium  05/30/2023 9.2  8.7 - 10.3 mg/dL Final  . AST  87/69/7975 14  8 - 39 U/L Final  . ALT  05/30/2023 19  5 - 38 U/L Final  . Alk Phos (alkaline Phosphatase) 05/30/2023 86  34 - 104 U/L Final  . Albumin  05/30/2023 4.0  3.5 - 4.8  g/dL Final  . Bilirubin, Total 05/30/2023 0.5  0.3 - 1.2 mg/dL Final  . Protein, Total 05/30/2023 6.4  6.1 - 7.9 g/dL Final  . A/G Ratio 87/69/7975 1.7  1.0 - 5.0 gm/dL Final  . Cholesterol, Total 05/30/2023 131  100 - 200 mg/dL Final  . Triglyceride 87/69/7975 149  35 - 199 mg/dL Final  . HDL (High Density Lipoprotein) Cho* 05/30/2023 29.1 (L)  35.0 - 85.0 mg/dL Final  . LDL Calculated 05/30/2023 72  0 - 130 mg/dL Final  . VLDL Cholesterol 05/30/2023 30  mg/dL Final  . Cholesterol/HDL Ratio 05/30/2023 4.5   Final  . Thyroid  Stimulating Hormone (TSH) 05/30/2023 9.638 (H)  0.450-5.330 uIU/ml uIU/mL Final  . Color 05/30/2023 Light Yellow  Colorless, Straw, Light Yellow, Yellow, Dark Yellow Final  . Clarity 05/30/2023 Clear  Clear Final  . Specific Gravity 05/30/2023 1.024  1.005 - 1.030 Final  . pH, Urine 05/30/2023 5.0  5.0 - 8.0 Final  . Protein, Urinalysis 05/30/2023 Negative  Negative mg/dL Final  . Glucose, Urinalysis 05/30/2023 Negative  Negative mg/dL Final  . Ketones, Urinalysis 05/30/2023 Negative  Negative mg/dL Final  . Blood, Urinalysis 05/30/2023 Negative  Negative Final   . Nitrite, Urinalysis 05/30/2023 Negative  Negative Final  . Leukocyte Esterase, Urinalysis 05/30/2023 3+ (!)  Negative Final  . Bilirubin, Urinalysis 05/30/2023 Negative  Negative Final  . Urobilinogen, Urinalysis 05/30/2023 0.2  0.2 - 1.0 mg/dL Final  . WBC, UA 87/69/7975 36 (H)  <=5 /hpf Final  . Red Blood Cells, Urinalysis 05/30/2023 1  <=3 /hpf Final  . Bacteria, Urinalysis 05/30/2023 0-5  0 - 5 /hpf Final  . Squamous Epithelial Cells, Urinaly* 05/30/2023 4  /hpf Final  . Hemoglobin A1C 05/30/2023 7.9 (H)  4.2 - 5.6 % Final  . Average Blood Glucose (Calc) 05/30/2023 180  mg/dL Final  . Urine Culture, Routine - [facility] 06/02/2023 Final report (!)   Final  . Result 1 - [facility] 06/02/2023 Klebsiella pneumoniae (!)   Final  . Antimicrobial Susceptibility - Lab* 06/02/2023 Comment   Final  Appointment on 02/24/2023  Component Date Value Ref Range Status  . CRC FIT 02/24/2023 Positive (!)  Negative Final  . Hemoccult ICT 03/04/2023 Negative  Negative Final  . Hemoccult ICT 03/04/2023 Negative  Negative Final  Office Visit on 01/24/2023  Component Date Value Ref Range Status  . WBC (White Blood Cell Count) 01/24/2023 8.5  4.1 - 10.2 10^3/uL Final  . RBC (Red Blood Cell Count) 01/24/2023 3.46 (L)  4.04 - 5.48 10^6/uL Final  . Hemoglobin 01/24/2023 11.0 (L)  12.0 - 15.0 gm/dL Final  . Hematocrit 91/73/7975 32.8 (L)  35.0 - 47.0 % Final  . MCV (Mean Corpuscular Volume) 01/24/2023 94.8  80.0 - 100.0 fl Final  . MCH (Mean Corpuscular Hemoglobin) 01/24/2023 31.8 (H)  27.0 - 31.2 pg Final  . MCHC (Mean Corpuscular Hemoglobin * 01/24/2023 33.5  32.0 - 36.0 gm/dL Final  . Platelet Count 01/24/2023 196  150 - 450 10^3/uL Final  . RDW-CV (Red Cell Distribution Widt* 01/24/2023 12.6  11.6 - 14.8 % Final  . MPV (Mean Platelet Volume) 01/24/2023 10.2  9.4 - 12.4 fl Final  . Neutrophils 01/24/2023 5.58  1.50 - 7.80 10^3/uL Final  . Lymphocytes 01/24/2023 2.09  1.00 - 3.60 10^3/uL Final  . Monocytes  01/24/2023 0.63  0.00 - 1.50 10^3/uL Final  . Eosinophils 01/24/2023 0.08  0.00 - 0.55 10^3/uL Final  . Basophils 01/24/2023 0.06  0.00 - 0.09  10^3/uL Final  . Neutrophil % 01/24/2023 66.1  32.0 - 70.0 % Final  . Lymphocyte % 01/24/2023 24.7  10.0 - 50.0 % Final  . Monocyte % 01/24/2023 7.5  4.0 - 13.0 % Final  . Eosinophil % 01/24/2023 0.9 (L)  1.0 - 5.0 % Final  . Basophil% 01/24/2023 0.7  0.0 - 2.0 % Final  . Immature Granulocyte % 01/24/2023 0.1  <=0.7 % Final  . Immature Granulocyte Count 01/24/2023 0.01  <=0.06 10^3/L Final  . Glucose 01/24/2023 245 (H)  70 - 110 mg/dL Final  . Sodium 91/73/7975 137  136 - 145 mmol/L Final  . Potassium 01/24/2023 4.7  3.6 - 5.1 mmol/L Final  . Chloride 01/24/2023 104  97 - 109 mmol/L Final  . Carbon Dioxide (CO2) 01/24/2023 24.6  22.0 - 32.0 mmol/L Final  . Urea Nitrogen (BUN) 01/24/2023 34 (H)  7 - 25 mg/dL Final  . Creatinine 91/73/7975 1.7 (H)  0.6 - 1.1 mg/dL Final  . Glomerular Filtration Rate (eGFR) 01/24/2023 32 (L)  >60 mL/min/1.73sq m Final  . Calcium  01/24/2023 9.3  8.7 - 10.3 mg/dL Final  . AST  91/73/7975 17  8 - 39 U/L Final  . ALT  01/24/2023 27  5 - 38 U/L Final  . Alk Phos (alkaline Phosphatase) 01/24/2023 93  34 - 104 U/L Final  . Albumin  01/24/2023 4.1  3.5 - 4.8 g/dL Final  . Bilirubin, Total 01/24/2023 0.6  0.3 - 1.2 mg/dL Final  . Protein, Total 01/24/2023 6.5  6.1 - 7.9 g/dL Final  . A/G Ratio 91/73/7975 1.7  1.0 - 5.0 gm/dL Final  . Color 91/73/7975 Light Yellow  Colorless, Straw, Light Yellow, Yellow, Dark Yellow Final  . Clarity 01/24/2023 Clear  Clear Final  . Specific Gravity 01/24/2023 1.018  1.005 - 1.030 Final  . pH, Urine 01/24/2023 5.5  5.0 - 8.0 Final  . Protein, Urinalysis 01/24/2023 Negative  Negative mg/dL Final  . Glucose, Urinalysis 01/24/2023 2+ (!)  Negative mg/dL Final  . Ketones, Urinalysis 01/24/2023 Negative  Negative mg/dL Final  . Blood, Urinalysis 01/24/2023 Negative  Negative Final  . Nitrite,  Urinalysis 01/24/2023 Negative  Negative Final  . Leukocyte Esterase, Urinalysis 01/24/2023 2+ (!)  Negative Final  . Bilirubin, Urinalysis 01/24/2023 Negative  Negative Final  . Urobilinogen, Urinalysis 01/24/2023 0.2  0.2 - 1.0 mg/dL Final  . WBC, UA 91/73/7975 22 (H)  <=5 /hpf Final  . Red Blood Cells, Urinalysis 01/24/2023 3  <=3 /hpf Final  . Bacteria, Urinalysis 01/24/2023 5-50 (!)  0 - 5 /hpf Final  . Squamous Epithelial Cells, Urinaly* 01/24/2023 4  /hpf Final  . Hemoglobin A1C 01/24/2023 8.5 (H)  4.2 - 5.6 % Final  . Average Blood Glucose (Calc) 01/24/2023 197  mg/dL Final  . Urine Culture, Routine - [facility] 01/25/2023 Final report (!)   Final  . Result 1 - [facility] 01/25/2023 Klebsiella pneumoniae (!)   Final  . Antimicrobial Susceptibility - Lab* 01/25/2023 Comment   Final   DIAGNOSIS: Essential hypertension, benign  (primary encounter diagnosis)  Coronary artery disease involving native coronary artery of native heart without angina pectoris  Type 2 diabetes mellitus with diabetic neuropathy, with long-term current use of insulin  (CMS/HHS-HCC)  Acquired hypothyroidism  Mixed hyperlipidemia  Stage 3 chronic kidney disease, unspecified whether stage 3a or 3b CKD (CMS/HHS-HCC)  Postlaminectomy syndrome, not elsewhere classified   PLAN: Anemia- labs today, stool cards given HTN- stable, same meds Thyroid  dz- same dose, labs today DM- same  meds, labs today CKD- push fluids, labs today HLD- diet/exercise/statin, labs today RTC 3 mo, sooner if needed     Attestation Statement:   I personally performed the service. (TP)  Reyes JONETTA Costa, MD, MD

## 2024-02-01 ENCOUNTER — Other Ambulatory Visit: Payer: Self-pay | Admitting: Internal Medicine

## 2024-02-01 DIAGNOSIS — Z1231 Encounter for screening mammogram for malignant neoplasm of breast: Secondary | ICD-10-CM

## 2024-03-07 NOTE — Progress Notes (Signed)
 Declined CCM

## 2024-03-16 NOTE — Progress Notes (Unsigned)
 Cardiology Office Note    Date:  03/19/2024   ID:  Keven, Soucy 12-30-1950, MRN [id number]  PCP:  Auston Reyes BIRCH, MD  Cardiologist:  Evalene Lunger, MD  Electrophysiologist:  None   Chief Complaint: Bradycardia and fatigue  History of Present Illness:   Dorothy Rogers is a 73 y.o. female with history of CAD with prior remote stenting to the LAD and RCA, DM2, chronic dyspnea with paralyzed hemidiaphragm, aortic atherosclerosis, HTN, HLD, CKD stage IV with anemia of chronic disease, IDDM, hypothyroidism, sleep apnea nonadherent to CPAP, and chronic back pain status post nerve stimulator who presents for evaluation of bradycardia and fatigue.  She was previously followed by [facility] cardiology, establishing care with Dr. Gollan in 08/2022.  She has undergone extensive cardiac testing over the years at [place], [place], and through [facility] as outlined below.  Most recent LHC in 07/2017 showed patent stents in the LAD and RCA with otherwise nonobstructive disease as outlined below.  She was last evaluated in our office in 03/2023 noting chronic shortness of breath attributed to paralyzed hemidiaphragm that is followed by pulmonology.  Most recent echo in 06/2023, performed through [facility] primary care reported an EF greater than 55%, grade 1 diastolic dysfunction, normal RV systolic function, trivial aortic insufficiency and tricuspid regurgitation with mild mitral regurgitation.  Heart rates on chart biopsy for 2025:  03/15/2024 - 108 bpm 01/31/2024 - 90 bpm 12/15/2023 - 96 bpm 09/29/2023 - 95 bpm 09/27/2023 - 88 bpm 09/14/2023 - 88 bpm 09/08/2023 - 94 bpm 07/07/2023 - 113 bpm 06/16/2023 - 104 bpm 06/13/2023 - 104 bpm 06/02/2023 - 98 bpm   She comes in accompanied by her husband today and reports a several week history of generalized malaise and fatigue.  BP machine at home has reported heart rates in the 50s bpm at times with blood pressure readings in the low 100s over 50s.  She is  without symptoms of frank chest pain.  Chronic dyspnea is stable.  Weight is stable by our scale when compared to visit last year.  No near-syncope or syncope.  No lower extremity swelling or progressive orthopnea.  Nonadherent to CPAP.  Remains on Cardizem  CD 180 mg and losartan  100 mg for blood pressure.   Labs independently reviewed: 11/2023 - Hgb 9.7, PLT 210, potassium 4.8, BUN 40, serum creatinine 2.0, albumin  4.0, AST/ALT normal, TC 136, TG 169, HDL 28, LDL 74, TSH normal, A1c 8.4 08/2023 - magnesium  1.8  Past Medical History:  Diagnosis Date   Anemia    Anxiety    a.) on BZO PRN (lorazepam )   Aortic atherosclerosis    Arthritis    lower back, hips   Asthma    Blood transfusion without reported diagnosis    CAD (coronary artery disease)    a.) PCI 07/2007 - stents x 2 (unknown type); b.) PCI 03/2008 - stents x 4 (unknown type); c.) cPET 09/16/2017: no LGE; no ischemia; d.) LHC 10/15/2015: 50-60% mRCA, diffuse mild LAD disease. 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   a.) following repair of hiatal hernia; b.) confirmed by sniff test in 2019; c.) has been seen by a specialist in [place]  and advised not a surgical candidate   Diastolic dysfunction    a.) TTE 09/17/2015: EF 65-70%, no RWMAs, G1DD, triv TR/PR; b.) TTE 02/10/2016: EF >55%, no RWMAs, G1DD, triv AR/MR, mild TR/PR; c.) TTE 01/05/2019: EF 60-65%, mild LVH, no RWMAs, G1DD; d.) TTE 08/18/2021: EF >55%, no RWMAs, G1DD, triv PR, mild MR/TR; e.) TTE 06/22/2023: EF >55%, no RWMAs,  G1DD, triv AR/TR/PR, mild MR   GERD (gastroesophageal reflux disease)    Herpes simplex    History of coronary angioplasty with insertion of stent 2009   a.) Total of 6 coronary stents as of 07/26/2023   History of hiatal hernia    History of kidney stones    Hyperlipidemia    Hypertension    Hypothyroidism    Intractable nausea and vomiting 06/09/2015   Kyphoscoliosis    Long-term use of aspirin  therapy    Malignant fibrous histiocytoma (posterior neck) 1986   Obstructive sleep apnea    a.) no nocturnal PAP therapy   On long term clopidogrel  therapy    Peripheral neuropathy    Peripheral vascular disease    Pulmonary hypertension (HCC) 05/10/2019   a.) RHC 05/10/2019: mRA 0, mPA 12, mPWCP 0, AO sat 98, PA sat 72.2, CO 3.74, CI 2.04   Status post bilateral cataract extraction    Thrombocytopenia    Type 2 diabetes mellitus without complication    Vertigo    Vitamin B 12 deficiency     Past Surgical History:  Procedure Laterality Date   85 HOUR PH STUDY N/A 02/04/2016   Procedure: 24 HOUR PH STUDY;  Surgeon: Rogelia Copping, MD;  Location: [facility] ENDOSCOPY;  Service: Endoscopy;  Laterality: N/A;   CATARACT EXTRACTION W/ INTRAOCULAR LENS IMPLANT Left 11/14/2013   CATARACT EXTRACTION W/PHACO Right 03/19/2015   Procedure: CATARACT EXTRACTION PHACO AND INTRAOCULAR LENS PLACEMENT (IOC);  Surgeon: Dene Etienne, MD;  Location: [facility];  Service: Ophthalmology;  Laterality: Right;  DIABETIC - insulin  and oral meds   CHOLECYSTECTOMY N/A 05/19/2016   Procedure: LAPAROSCOPIC CHOLECYSTECTOMY;  Surgeon: Carlin Pastel, MD;  Location: [facility] ORS;  Service: General;  Laterality: N/A;   CORONARY ANGIOPLASTY WITH STENT PLACEMENT  03/2008   4 stents placed   CORONARY ANGIOPLASTY WITH STENT PLACEMENT  07/2007   2 stents placed   CYSTECTOMY Right    wrist   ESOPHAGEAL MANOMETRY N/A 02/04/2016   Procedure: ESOPHAGEAL MANOMETRY (EM);  Surgeon: Rogelia Copping, MD;  Location: [facility]  ENDOSCOPY;  Service: Endoscopy;  Laterality: N/A;   ESOPHAGOGASTRODUODENOSCOPY (EGD) WITH PROPOFOL  N/A 09/04/2015   Procedure: ESOPHAGOGASTRODUODENOSCOPY (EGD) WITH PROPOFOL ;  Surgeon: Rogelia Copping, MD;  Location: [facility];  Service: Endoscopy;  Laterality: N/A;  INSULIN  DEPENDENT DIABETIC   ESOPHAGOGASTRODUODENOSCOPY (EGD) WITH PROPOFOL  N/A 06/29/2016   Procedure: ESOPHAGOGASTRODUODENOSCOPY (EGD) WITH PROPOFOL ;  Surgeon: Ruel Kung, MD;  Location: [facility] ENDOSCOPY;  Service: Endoscopy;  Laterality: N/A;   ESOPHAGOGASTRODUODENOSCOPY (EGD) WITH PROPOFOL  N/A 01/25/2019   Procedure: ESOPHAGOGASTRODUODENOSCOPY (EGD) WITH PROPOFOL ;  Surgeon: Kung Ruel, MD;  Location: [facility] ENDOSCOPY;  Service: Gastroenterology;  Laterality: N/A;   HIATAL HERNIA REPAIR  02/2016   Dr Pastel   LEFT HEART CATH AND CORONARY ANGIOGRAPHY Left 08/18/2017   Procedure: LEFT HEART CATH AND CORONARY ANGIOGRAPHY;  Surgeon: Bosie Vinie LABOR, MD;  Location: [facility] INVASIVE CV LAB;  Service: Cardiovascular;  Laterality: Left;   LEFT HEART CATH AND CORONARY ANGIOGRAPHY Left 10/15/2015   LUMBAR FUSION  2001   L1-2   POSTERIOR LUMBAR FUSION  03/27/2013   L4-5   POSTERIOR LUMBAR FUSION  05/09/2014   L3-4   POSTERIOR LUMBAR FUSION  05/30/2015   L2-3   POSTERIOR LUMBAR FUSION  01/21/2017   L1-2, 2-3, 3-4   RIGHT HEART CATH N/A 05/10/2019   Procedure: RIGHT HEART CATH;  Surgeon: Bosie Vinie LABOR, MD;  Location: [facility] INVASIVE CV LAB;  Service: Cardiovascular;  Laterality: N/A;   SPINAL CORD STIMULATOR IMPLANT  02/2022   TOTAL HIP ARTHROPLASTY Left 07/28/2023   Procedure: TOTAL HIP ARTHROPLASTY ANTERIOR APPROACH;  Surgeon: Lorelle Hussar, MD;  Location: [facility] ORS;  Service: Orthopedics;  Laterality: Left;   TUMOR EXCISION  1986   back of neck malignant fibrous histiocytoma   VAGINAL HYSTERECTOMY  1992    Current Medications: Current Meds  Medication Sig   acetaminophen  (TYLENOL ) 500 MG tablet Take 1,000 mg by mouth every  6 (six) hours as needed for mild pain or moderate pain.    acyclovir  (ZOVIRAX ) 400 MG tablet Take 400 mg by mouth daily as needed (for fever blisters).    amitriptyline  (ELAVIL ) 10 MG tablet Take 10 mg by mouth at bedtime.   atorvastatin  (LIPITOR) 40 MG tablet Take 40 mg by mouth at bedtime.   Biotin  10 MG TABS Take 10 mg by mouth daily.   Calcium  Carbonate-Vitamin D  600-400 MG-UNIT tablet Take 1 tablet by mouth daily.   Cholecalciferol (VITAMIN D -3) 125 MCG (5000 UT) TABS Take 5,000 Units by mouth daily.   clopidogrel  (PLAVIX ) 75 MG tablet Take 75 mg by mouth daily.   diltiazem  (CARDIZEM  CD) 180 MG 24 hr capsule Take 180 mg by mouth daily.   docusate sodium  (COLACE) 100 MG capsule Take 1 capsule (100 mg total) by mouth 2 (two) times daily.   ezetimibe  (ZETIA ) 10 MG tablet Take 1 tablet by mouth once daily   ferrous sulfate  325 (65 FE) MG tablet Take 325 mg by mouth daily with breakfast.   glipiZIDE  (GLUCOTROL ) 10 MG tablet Take 10 mg by mouth 2 (two) times daily.   insulin  glargine (LANTUS  SOLOSTAR) 100 UNIT/ML Solostar Pen Inject 36 Units into the skin daily. (Patient taking differently: Inject 30-50 Units into the skin daily.)   insulin  lispro (HUMALOG) 100 UNIT/ML KwikPen Inject 0-15 Units into the skin 3 (three) times daily as needed (high blood sugar).   levothyroxine  (SYNTHROID ) 50 MCG tablet Take 50 mcg by mouth daily before breakfast.   LORazepam  (ATIVAN ) 1 MG tablet Take 1 mg by mouth every 8 (eight) hours as needed for anxiety.   ondansetron  (ZOFRAN ) 4 MG tablet Take 1 tablet (4 mg total) by mouth every 6 (six) hours as needed for nausea.   pantoprazole  (PROTONIX ) 20 MG tablet Take 40 mg by mouth daily.   [DISCONTINUED] losartan  (COZAAR ) 100 MG tablet Take 100 mg by mouth daily.    Allergies:   Sildenafil, Tramadol , Beta adrenergic blockers, Celecoxib, Duloxetine, Gabapentin , Heparin , Hydrocodone -acetaminophen , Indomethacin, Niacin and related, Shellfish allergy, Vicodin  [hydrocodone -acetaminophen ], Carbamazepine , and Oxycodone    Social History   Socioeconomic History   Marital status: Married    Spouse name: Christopher   Number of children: 2   Years of education: Not on file   Highest education level: Not on file  Occupational History   Not on file  Tobacco Use   Smoking status: Never   Smokeless tobacco:  Never  Vaping Use   Vaping status: Never Used  Substance and Sexual Activity   Alcohol  use: Yes    Alcohol /week: 0.0 standard drinks of alcohol     Comment: 2-3 drinks per year   Drug use: Never   Sexual activity: Not Currently  Other Topics Concern   Not on file  Social History Narrative   Not on file   Social Drivers of Health   Financial Resource Strain: Low Risk  (12/06/2023)   Received from [facility]   Overall Financial Resource Strain (CARDIA)    Difficulty of Paying Living Expenses: Not hard at all  Food Insecurity: No Food Insecurity (12/06/2023)   [facility]   Hunger Vital Sign    Within the past 12 months, you worried that your food would run out before you got the money to buy more.: Never true    Within the past 12 months, the food you bought just didn't last and you didn't have money to get more.: Never true  Transportation Needs: No Transportation Needs (12/06/2023)   Received from [facility] - Transportation    In the past 12 months, has lack of transportation kept you from medical appointments or from getting medications?: No    Lack of Transportation (Non-Medical): No  Physical Activity: Not on file  Stress: No Stress Concern Present (01/22/2022)   Received from [facility] - Occupational Stress Questionnaire    Feeling of Stress : Not at all  Social Connections: Moderately Isolated (07/28/2023)   Social Connection and Isolation Panel    Frequency of Communication with Friends and Family: More than  three times a week    Frequency of Social Gatherings with Friends and Family: Never    Attends Religious Services: Never    Database administrator or Organizations: No    Attends Engineer, structural: Never    Marital Status: Married     Family History:  The patient's family history includes Diabetes in an other family member; Heart attack (age of onset: 32) in her father; Heart disease in her father. There is no history of Breast cancer.  ROS:   12-point review of systems is negative unless otherwise noted in the HPI.   EKGs/Labs/Other Studies Reviewed:    Studies reviewed were summarized above. The additional studies were reviewed today:  2D echo 06/22/2023 [facility]): NORMAL LEFT VENTRICULAR SYSTOLIC FUNCTION WITH NO LVH  ESTIMATED EF: >55%  NORMAL LA PRESSURES WITH DIASTOLIC DYSFUNCTION (GRADE 1)  NORMAL RIGHT VENTRICULAR SYSTOLIC FUNCTION  VALVULAR REGURGITATION: TRIVIAL AR, MILD MR, TRIVIAL PR, TRIVIAL TR  NO VALVULAR STENOSIS  __________  RHC 05/10/2019 [facility]): Hemodynamic findings consistent with borderline pulmonary hypertension. LV end diastolic pressure is normal.   Borderline pulmonary hypertension Medical manangement __________  2D echo 08/18/2021 [facility]): NORMAL LEFT VENTRICULAR SYSTOLIC FUNCTION  NORMAL RIGHT VENTRICULAR SYSTOLIC FUNCTION  MILD VALVULAR REGURGITATION (MR) NO VALVULAR STENOSIS   2D echo 01/05/2019: 1. The left ventricle has normal systolic function with an ejection  fraction of 60-65%. The cavity size was normal. There is mild concentric  left ventricular hypertrophy. Left ventricular diastolic Doppler  parameters are consistent with impaired  relaxation.   2. The right ventricle has normal systolic function. The cavity was  normal. There is no increase in right ventricular wall thickness. Right  ventricular systolic pressure could not be assessed.   3. The mitral valve is  grossly normal.   4. The tricuspid valve is  grossly normal.   5. The aortic valve is grossly normal. No stenosis of the aortic valve.   6. The aorta is normal in size and structure.  __________  CPX 11/29/2017: Conclusion: The interpretation of this test is limited due to submaximal effort during the exercise. Based on available data, exercise testing with gas exchange demonstrates low-normal functional capacity when compared to matched sedentary norms. Patient's body habitus, deconditioning is playing a significant role in her exercise intolerance. However, other submaximal parameters are suggestive of possible circulatory limitation. Note the tachycardia at rest and little to no change in BP during the incremental exercise. __________  48-hour Holter monitor 09/2017 [facility]): Heart Rate:   Average: 74 bpm   Minimum: 48 bpm at 5:37 PM on day 2   Maximum: 134 bpm at 12:52 PM on day 2  Pauses > 2 seconds: 0  Longest RR interval: 1.4 sec at 5:40 PM on day 2 .   Ventricular Ectopy:   Supraventricular Ectopy  Total: 0    total: 150  Isolated: 0    isolated: 150  Couplets: 0    pairs: 0  Runs: 0    runs: 0   Longest: N/A     longest: N/A   Fastest: N/A     fastest: N/A   Summary:  1. Sinus rhythm with rare PACs.  No sustained arrhythmias.  No ventricular ectopy.  No significant abnormalities noted on Holter monitor.  __________  Adenosine cardiac MRI 09/19/2017 ([facility]): 1. The left ventricle is normal in cavity size and wall thickness. Global systolic function is normal, the  calculated LV ejection fraction is 66%. There are no regional wall motion abnormalities. There is a small crypt, possibly the site of a healed VSD, in the mid-chamber anteroseptal wall.   2. The right ventricle is normal in cavity size, wall thickness, and systolic function.   3. Both atria are normal in size.   4. The aortic valve is trileaflet in morphology with normal opening motion. There is no significant valvular disease.   5. Delayed enhancement  imaging demonstrates no evidence of myocardial infarction, scar or infiltrative disease.   6. Adenosine stress perfusion imaging demonstrates no evidence of inducible myocardial ischemia.    7. No intra-cardiac thrombus visualized.  ___________  [facility] 08/18/2017 [facility]): Ost LAD to Prox LAD lesion is 40% stenosed. Prox LAD lesion is 30% stenosed. 1st Mrg lesion is 60% stenosed. Prox RCA to Mid RCA lesion is 60% stenosed. Mid RCA to Dist RCA lesion is 30% stenosed. The left ventricular systolic function is normal. LV end diastolic pressure is normal. There is no aortic valve stenosis. There is no mitral valve stenosis and no mitral valve prolapse evident.   Patent stents in lad and rca with no critical disease.  Contiue medical management LV normal __________  2D echo 04/08/2016: - Left ventricle: The cavity size was mildly dilated. Wall    thickness was normal. Systolic function was normal. The estimated    ejection fraction was in the range of 60% to 65%. Wall motion was    normal; there were no regional wall motion abnormalities.  - Aortic valve: Valve area (Vmax): 3.06 cm^2.  - Right atrium: The atrium was mildly dilated.   Impressions:   - Normal LVF    Normal Wall Motion    EF=65%    Normal RVF    Mild RVE. Normal study. No cardiac source of emboli was  indentified.  __________  2D echo 02/10/2016 [facility]): NORMAL LEFT VENTRICULAR SYSTOLIC FUNCTION  NORMAL RIGHT VENTRICULAR SYSTOLIC FUNCTION  MILD VALVULAR REGURGITATION  NO VALVULAR STENOSIS  Closest EF: >55% (Estimated)  Mitral: TRIVIAL MR  Aortic: TRIVIAL AR  Tricuspid: MILD TR  __________  [facility] 10/15/2015 [facility]): Findings:  1. Non flow limiting coronary artery disease (as described below)  including 50-60% RCA mid vessel stenosis, and diffuse mild LAD disease  2. Patent RCA patent stents  3. Normal left ventricular filling pressures (LVEPD = 6 mm Hg).  4. Left ventriculogram not performed,  echocardiogram on 08/2015 shows  normal LV function   Recommendations:  Medical management of moderate CAD- there are no targets for PCI,   Negative PET stress test 08/2015  Suspect that her symptom complex is multifactorial  Continue medical therapies  ___________  48-hour Holter 09/2015 [facility]): Findings:  The minimum heart rate was approximately 59 bpm  The maximum heart rate was approximately 126 bpm  The average heart rate was 83 bpm.   There were approximately 0 premature ventricular beat(s)  There were 0 ventricular run(s) noted.  s  There were approximately 10 supraventricular beat(s)  There were 0 SVT run(s) noted   Reported symptoms: No diary submitted   IMPRESSION: Sinus rhythm with rare PACs.  No sustained arrhythmias.  Essentially normal Holter.  __________  2D echo 09/17/2015 [facility]): Left ventricular hypertrophy - mild   Hyperdynamic left ventricular systolic function, ejection fraction 65 to  70%   Degenerative mitral valve disease   Aortic sclerosis   Normal right ventricular systolic function  __________  Myocardial PET 09/17/2015 [facility]): Impressions:  - Normal myocardial perfusion study  - No evidence for significant ischemia or scar is noted.  - During stress: Global systolic function is normal. The ejection fraction  was greater than 65%. Left ventricular chamber size is relatively small.  - Attenuation CT scan shows post PCI findings  - Also noted on attenuation CT is a large sized hiatal hernia and  gallstones (other findings as per CT 4/18)     EKG:  EKG is ordered today.  The EKG ordered today demonstrates NSR, 81 bpm, poor R wave progression along the precordial leads, baseline artifact, consistent with prior tracings  Recent Labs: 07/19/2023: ALT 20 07/29/2023: BUN 46; Creatinine, Ser 2.11; Hemoglobin 8.0; Platelets 154; Potassium 5.1; Sodium 133  Recent Lipid Panel No results found for: CHOL, TRIG, HDL, CHOLHDL, VLDL, LDLCALC,  LDLDIRECT  PHYSICAL EXAM:    VS:  BP 120/60 (BP Location: Left Arm, Patient Position: Sitting, Cuff Size: Large)   Pulse 81   Ht 5' 2.5 (1.588 m)   Wt 176 lb 4 oz (79.9 kg)   SpO2 98%   BMI 31.72 kg/m   BMI: Body mass index is 31.72 kg/m.  Physical Exam  Wt Readings from Last 3 Encounters:  03/19/24 176 lb 4 oz (79.9 kg)  07/28/23 175 lb (79.4 kg)  07/19/23 175 lb (79.4 kg)     ASSESSMENT & PLAN:   Fatigue with reported bradycardia and relative hypotension: Hemodynamically stable in the office today with a heart rate of 81 bpm and blood pressure 120/60.  Patient has previously been noted to have runs of atrial bigeminy which may be contributing to reported bradycardic rates in the 50s bpm at home with automated monitor.  This also coincides with her typical rates in the 90s to low 100s bpm.  Suspect untreated sleep apnea is also contributing to her fatigue.  Place ZIO  XT to evaluate for symptomatic bradycardia, prolonged pauses, high-grade AV block, sustained arrhythmias, or frequent ectopy.  Obtain echo to evaluate for significant structural abnormality.  Check CBC, TSH, and BMP.  CAD involving the native coronary arteries without angina: Without symptoms concerning for angina or cardiac decompensation.  Remains on clopidogrel  75 mg daily, as well as atorvastatin  40 mg and ezetimibe  10 mg.  Cardiac murmur: Obtain echo.  HTN: Blood pressure is well-controlled in the office today.  However, she reports blood pressures in the low 100s over 50s at home.  Reduce losartan  to 50 mg daily.  For now, she will continue Cardizem  CD 180 mg daily as outlined above.    HLD: LDL 74 in 11/2023 with normal AST/ALT at that time.  Remains on atorvastatin  40 mg and ezetimibe  10 mg.  Untreated OSA: Noncompliant with CPAP, unable to wear face mask.  Likely contributing to underlying fatigue.  Consider alternative therapeutic options.  Managed by pulmonology, follow-up as directed.     Disposition:  F/u with Dr. Gollan or an APP in 8 weeks.   Medication Adjustments/Labs and Tests Ordered: Current medicines are reviewed at length with the patient today.  Concerns regarding medicines are outlined above. Medication changes, Labs and Tests ordered today are summarized above and listed in the Patient Instructions accessible in Encounters.   Signed, Bernardino Bring, PA-C 03/19/2024 8:50 AM     [facility] [address] [place], [place] [address] [phone]

## 2024-03-19 ENCOUNTER — Encounter: Payer: Self-pay | Admitting: Physician Assistant

## 2024-03-19 ENCOUNTER — Ambulatory Visit
Admission: RE | Admit: 2024-03-19 | Discharge: 2024-03-19 | Disposition: A | Discharge status: Home / Self Care | Source: Ambulatory Visit | Attending: Internal Medicine | Admitting: Internal Medicine

## 2024-03-19 ENCOUNTER — Ambulatory Visit (INDEPENDENT_AMBULATORY_CARE_PROVIDER_SITE_OTHER): Admitting: Physician Assistant

## 2024-03-19 ENCOUNTER — Ambulatory Visit (INDEPENDENT_AMBULATORY_CARE_PROVIDER_SITE_OTHER)

## 2024-03-19 VITALS — BP 120/60 | HR 81 | Ht 62.5 in | Wt 176.2 lb

## 2024-03-19 DIAGNOSIS — R0602 Shortness of breath: Secondary | ICD-10-CM | POA: Diagnosis present

## 2024-03-19 DIAGNOSIS — I10 Essential (primary) hypertension: Secondary | ICD-10-CM | POA: Insufficient documentation

## 2024-03-19 DIAGNOSIS — G4733 Obstructive sleep apnea (adult) (pediatric): Secondary | ICD-10-CM | POA: Insufficient documentation

## 2024-03-19 DIAGNOSIS — I2510 Atherosclerotic heart disease of native coronary artery without angina pectoris: Secondary | ICD-10-CM

## 2024-03-19 DIAGNOSIS — R5383 Other fatigue: Secondary | ICD-10-CM | POA: Insufficient documentation

## 2024-03-19 DIAGNOSIS — E785 Hyperlipidemia, unspecified: Secondary | ICD-10-CM | POA: Diagnosis present

## 2024-03-19 DIAGNOSIS — Z1231 Encounter for screening mammogram for malignant neoplasm of breast: Secondary | ICD-10-CM | POA: Diagnosis present

## 2024-03-19 DIAGNOSIS — R001 Bradycardia, unspecified: Secondary | ICD-10-CM | POA: Diagnosis not present

## 2024-03-19 DIAGNOSIS — R002 Palpitations: Secondary | ICD-10-CM | POA: Insufficient documentation

## 2024-03-19 MED ORDER — LOSARTAN POTASSIUM 50 MG PO TABS: 50.0000 mg | ORAL_TABLET | Freq: Every day | ORAL | 1 refills | Status: AC

## 2024-03-19 NOTE — Patient Instructions (Signed)
 Medication Instructions:  DECREASE Losartan  50 mg once daily *If you need a refill on your cardiac medications before your next appointment, please call your pharmacy*  Lab Work: Your provider would like for you to have the following labs today: BMET, CBC annd TSH  If you have labs (blood work) drawn today and your tests are completely normal, you will receive your results only by: MyChart Message (if you have MyChart) OR A paper copy in the mail If you have any lab test that is abnormal or we need to change your treatment, we will call you to review the results.  Testing/Procedures: Your physician has requested that you have an echocardiogram. Echocardiography is a painless test that uses sound waves to create images of your heart. It provides your doctor with information about the size and shape of your heart and how well your heart's chambers and valves are working.   You may receive an ultrasound enhancing agent through an IV if needed to better visualize your heart during the echo. This procedure takes approximately one hour.  There are no restrictions for this procedure.  This will take place at [address] [facility], [place] [address]  Please note: We ask at that you not bring children with you during ultrasound (echo/ vascular) testing. Due to room size and safety concerns, children are not allowed in the ultrasound rooms during exams. Our front office staff cannot provide observation of children in our lobby area while testing is being conducted. An adult accompanying a patient to their appointment will only be allowed in the ultrasound room at the discretion of the ultrasound technician under special circumstances. We apologize for any inconvenience.   Follow-Up: At [facility], you and your health needs are our priority.  As part of our continuing mission to provide you with exceptional heart care, our providers are all part of one team.  This  team includes your primary Cardiologist (physician) and Advanced Practice Providers or APPs (Physician Assistants and Nurse Practitioners) who all work together to provide you with the care you need, when you need it.  Your next appointment:   8 week(s)  Provider:   Bernardino Bring, PA-C    We recommend signing up for the patient portal called MyChart.  Sign up information is provided on this After Visit Summary.  MyChart is used to connect with patients for Virtual Visits (Telemedicine).  Patients are able to view lab/test results, encounter notes, upcoming appointments, etc.  Non-urgent messages can be sent to your provider as well.   To learn more about what you can do with MyChart, go to [email or web address].   Other Instructions ZIO XT- Long Term Monitor Instructions  Your physician has requested you wear a ZIO patch monitor for 14 days.  This is a single patch monitor. [employer] supplies one patch monitor per enrollment. Additional stickers are not available. Please do not apply patch if you will be having a Nuclear Stress Test, Echocardiogram, Cardiac CT, MRI, or Chest Xray during the period you would be wearing the monitor. The patch cannot be worn during these tests. You cannot remove and re-apply the ZIO XT patch monitor.  Your ZIO patch monitor will be mailed 3 day USPS to your address on file. It may take 3-5 days to receive your monitor after you have been enrolled. Once you have received your monitor, please review the enclosed instructions. Your monitor has already been registered assigning a specific monitor serial number to you.  Billing and Patient Assistance Program Information  We have supplied [employer] with any of your insurance information on file for billing purposes.  [employer] offers a sliding scale Patient Assistance Program for patients that do not have insurance, or whose insurance does not completely cover the cost of the ZIO monitor.  You must apply for the Patient  Assistance Program to qualify for this discounted rate.  To apply, please call [employer] at [phone], select option 4, select option 2, ask to apply for Patient Assistance Program. Meredeth will ask your household income, and how many people are in your household. They will quote your out-of-pocket cost based on that information. [employer] will also be able to set up a 79-month, interest-free payment plan if needed.  Applying the monitor   Shave hair from upper left chest.  Hold abrader disc by orange tab. Rub abrader in 40 strokes over the upper left chest as indicated in your monitor instructions.  Clean area with 4 enclosed alcohol  pads. Let dry.  Apply patch as indicated in monitor instructions. Patch will be placed under collarbone on left side of chest with arrow pointing upward.  Rub patch adhesive wings for 2 minutes. Remove white label marked 1. Remove the white label marked 2. Rub patch adhesive wings for 2 additional minutes.  While looking in a mirror, press and release button in center of patch. A small green light will flash 3-4 times. This will be your only indicator that the monitor has been turned on.  Do not shower for the first 24 hours. You may shower after the first 24 hours.  Press the button if you feel a symptom. You will hear a small click. Record Date, Time and Symptom in the Patient Logbook.  When you are ready to remove the patch, follow instructions on the last 2 pages of Patient Logbook.  Stick patch monitor into the tabs at the bottom of the return box.  Place Patient Logbook in the blue and white box. Use locking tab on box and tape box closed securely. The blue and white box has prepaid postage on it. Please place it in the mailbox as soon as possible. Your physician should have your test results approximately 7-14 days after the monitor has been mailed back to [facility].  Call [facility] Customer Care at [phone] if you have questions regarding your  ZIO XT patch monitor.  Call them immediately if you see an orange light blinking on your monitor.  If your monitor falls off in less than 4 days, contact our Monitor department at [phone].  If your monitor becomes loose or falls off after 4 days call [employer] at [phone] for suggestions on securing your monitor.

## 2024-03-20 ENCOUNTER — Ambulatory Visit: Payer: Self-pay | Admitting: Physician Assistant

## 2024-03-20 LAB — BASIC METABOLIC PANEL WITH GFR
BUN/Creatinine Ratio: 21 (ref 12–28)
BUN: 36 mg/dL — ABNORMAL HIGH (ref 8–27)
CO2: 19 mmol/L — ABNORMAL LOW (ref 20–29)
Calcium: 8.8 mg/dL (ref 8.7–10.3)
Chloride: 103 mmol/L (ref 96–106)
Creatinine, Ser: 1.68 mg/dL — ABNORMAL HIGH (ref 0.57–1.00)
Glucose: 157 mg/dL — ABNORMAL HIGH (ref 70–99)
Potassium: 4.5 mmol/L (ref 3.5–5.2)
Sodium: 137 mmol/L (ref 134–144)
eGFR: 32 mL/min/1.73 — ABNORMAL LOW (ref >59)

## 2024-03-20 LAB — CBC
Hematocrit: 31.9 % — ABNORMAL LOW (ref 34.0–46.6)
Hemoglobin: 10.2 g/dL — ABNORMAL LOW (ref 11.1–15.9)
MCH: 30.5 pg (ref 26.6–33.0)
MCHC: 32 g/dL (ref 31.5–35.7)
MCV: 96 fL (ref 79–97)
Platelets: 204 x10E3/uL (ref 150–450)
RBC: 3.34 x10E6/uL — ABNORMAL LOW (ref 3.77–5.28)
RDW: 12.7 % (ref 11.7–15.4)
WBC: 9.7 x10E3/uL (ref 3.4–10.8)

## 2024-03-20 LAB — TSH: TSH: 2.39 u[IU]/mL (ref 0.450–4.500)

## 2024-03-20 NOTE — Progress Notes (Signed)
 Last read by Meade JINNY Mackintosh at 1:24PM on 03/20/2024.

## 2024-04-03 ENCOUNTER — Ambulatory Visit: Admitting: Cardiovascular Disease

## 2024-04-24 ENCOUNTER — Encounter: Payer: Self-pay | Admitting: Emergency Medicine

## 2024-04-24 DIAGNOSIS — R002 Palpitations: Secondary | ICD-10-CM | POA: Diagnosis not present

## 2024-05-01 ENCOUNTER — Ambulatory Visit: Attending: Physician Assistant

## 2024-05-01 DIAGNOSIS — R0602 Shortness of breath: Secondary | ICD-10-CM | POA: Diagnosis present

## 2024-05-01 LAB — ECHOCARDIOGRAM COMPLETE
AR max vel: 2.04 cm2
AV Area VTI: 2.16 cm2
AV Area mean vel: 2.1 cm2
AV Mean grad: 4 mmHg
AV Peak grad: 7.3 mmHg
Ao pk vel: 1.35 m/s
Area-P 1/2: 3.63 cm2
S' Lateral: 2.5 cm

## 2024-05-10 NOTE — H&P (View-Only) (Signed)
 Cardiology Office Note    Date:  05/15/2024   ID:  Dorothy, Rogers 11-22-1950, MRN [id number]  PCP:  Auston Reyes BIRCH, MD  Cardiologist:  Evalene Lunger, MD  Electrophysiologist:  None   Chief Complaint: Follow-up  History of Present Illness:   Dorothy Rogers is a 73 y.o. female with history of CAD with prior remote stenting to the LAD and RCA, DM2, chronic dyspnea with paralyzed hemidiaphragm, aortic atherosclerosis, HTN, HLD, CKD stage IV with anemia of chronic disease, IDDM, hypothyroidism, sleep apnea nonadherent to CPAP, and chronic back pain status post nerve stimulator who presents for follow-up of ZIO and echo.  She was previously followed by [facility] cardiology, establishing care with Dr. Gollan in 08/2022.  She has undergone extensive cardiac testing over the years at [place], [place], and through [place] cardiology as outlined below.  Most recent LHC in 07/2017 showed patent stents in the LAD and RCA with otherwise nonobstructive disease as outlined below.  She was evaluated in our office in 03/2023 noting chronic shortness of breath attributed to paralyzed hemidiaphragm that is followed by pulmonology.  Echo in 06/2023, performed through [facility] primary care reported an EF greater than 55%, grade 1 diastolic dysfunction, normal RV systolic function, trivial aortic insufficiency and tricuspid regurgitation with mild mitral regurgitation.  She was evaluated in our office in 02/2024 reporting a several week history of generalized malaise and fatigue.  BP machine at home was reporting heart rates in the 50s bpm at times with BP in the low 100s over 50s.  Chronic dyspnea was stable.  Heart rate of 81 bpm in the office with a blood pressure 120/60.  It was suspected untreated sleep apnea was contributing to her fatigue.  Laboratory evaluation was unrevealing as outlined below.  Chart biopsy showed no evidence of bradycardic rates as outlined below.  It was recommended losartan  be reduced to  50 mg daily with continuation of Cardizem  CD 180 mg daily.   Zio patch in 02/2024 showed a predominant rhythm of sinus with an average rate of 79 bpm, 8 episodes of SVT occurred lasting up to 10 beats, junctional escape beats were present, and rare atrial and ventricular ectopy.  Patient triggered events were associated with normal sinus rhythm.  Echo in 04/2024 showed an EF of 60 to 65%, no regional wall motion abnormalities, grade 1 diastolic dysfunction, normal RV systolic function and ventricular cavity size, mild to moderate mitral regurgitation, aortic valve sclerosis without evidence of insufficiency or stenosis, and a normal CVP.  She comes in today accompanied by her husband and continues to note exertional shortness of breath and fatigue without frank chest pain.  No dizziness, near-syncope, or syncope.  No significant lower extremity swelling or progressive orthopnea.  No falls or symptoms concerning for bleeding.  She reports that symptoms feel similar to her prior angina leading up to her stenting in 2009.  Never had chest pain with stenting.   Labs independently reviewed: 03/2024 - Hgb 10.4, PLT 208, potassium 5.0, BUN 38, serum creatinine 1.8, albumin  4.1, AST/ALT normal, A1c 7.7 02/2024 - TSH normal 11/2023 - TC 136, TG 169, HDL 28, LDL 74, TSH normal  Past Medical History:  Diagnosis Date   Anemia    Anxiety    a.) on BZO PRN (lorazepam )   Aortic atherosclerosis    Arthritis    lower back, hips   Asthma    Blood transfusion without reported diagnosis    CAD (coronary artery disease)  a.) PCI 07/2007 - stents x 2 (unknown type); b.) PCI 03/2008 - stents x 4 (unknown type); c.) cPET 09/16/2017: no LGE; no ischemia; d.) LHC 10/15/2015: 50-60% mRCA, diffuse mild LAD disease. 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   a.) following repair of hiatal hernia; b.) confirmed by sniff test in 2019; c.) has been seen by a specialist in [place]  and advised not a surgical candidate   Diastolic dysfunction    a.) TTE 09/17/2015: EF 65-70%, no RWMAs, G1DD, triv TR/PR; b.) TTE 02/10/2016: EF >55%, no RWMAs, G1DD, triv AR/MR, mild TR/PR; c.) TTE 01/05/2019: EF 60-65%, mild LVH, no RWMAs, G1DD; d.) TTE 08/18/2021: EF >55%, no RWMAs, G1DD, triv PR, mild MR/TR; e.) TTE 06/22/2023: EF >55%, no RWMAs, G1DD, triv AR/TR/PR, mild MR   GERD (gastroesophageal reflux disease)    Herpes simplex    History of coronary angioplasty with insertion of stent 2009   a.) Total of 6 coronary stents as of 07/26/2023   History of hiatal hernia    History of kidney stones    Hyperlipidemia    Hypertension    Hypothyroidism    Intractable nausea and vomiting 06/09/2015   Kyphoscoliosis    Long-term use of aspirin  therapy    Malignant fibrous histiocytoma (posterior neck) 1986   Obstructive sleep apnea    a.) no nocturnal PAP therapy   On long term clopidogrel  therapy    Peripheral neuropathy    Peripheral vascular disease    Pulmonary hypertension (HCC) 05/10/2019   a.) RHC 05/10/2019: mRA 0, mPA 12, mPWCP 0, AO sat 98, PA sat 72.2, CO 3.74, CI 2.04   Status post bilateral cataract extraction    Thrombocytopenia    Type 2 diabetes mellitus without complication    Vertigo    Vitamin B 12 deficiency     Past Surgical History:  Procedure Laterality Date   103 HOUR PH STUDY N/A 02/04/2016   Procedure: 24 HOUR PH STUDY;  Surgeon: Rogelia Copping, MD;  Location: [facility];  Service: Endoscopy;  Laterality: N/A;   CATARACT EXTRACTION W/ INTRAOCULAR  LENS IMPLANT Left 11/14/2013   CATARACT EXTRACTION W/PHACO Right 03/19/2015   Procedure: CATARACT EXTRACTION PHACO AND INTRAOCULAR LENS PLACEMENT (IOC);  Surgeon: Dene Etienne, MD;  Location: [facility];  Service: Ophthalmology;  Laterality: Right;  DIABETIC - insulin  and oral meds   CHOLECYSTECTOMY N/A 05/19/2016   Procedure: LAPAROSCOPIC CHOLECYSTECTOMY;  Surgeon: Carlin Pastel, MD;  Location: [facility];  Service: General;  Laterality: N/A;   CORONARY ANGIOPLASTY WITH STENT PLACEMENT  03/2008   4 stents placed   CORONARY ANGIOPLASTY WITH STENT PLACEMENT  07/2007   2 stents placed   CYSTECTOMY Right    wrist   ESOPHAGEAL MANOMETRY N/A 02/04/2016   Procedure: ESOPHAGEAL MANOMETRY (EM);  Surgeon: Rogelia Copping, MD;  Location: [facility];  Service: Endoscopy;  Laterality: N/A;   ESOPHAGOGASTRODUODENOSCOPY (EGD) WITH PROPOFOL  N/A 09/04/2015   Procedure: ESOPHAGOGASTRODUODENOSCOPY (EGD) WITH PROPOFOL ;  Surgeon: Rogelia Copping, MD;  Location: [facility];  Service: Endoscopy;  Laterality: N/A;  INSULIN  DEPENDENT DIABETIC   ESOPHAGOGASTRODUODENOSCOPY (EGD) WITH PROPOFOL  N/A 06/29/2016   Procedure: ESOPHAGOGASTRODUODENOSCOPY (EGD) WITH PROPOFOL ;  Surgeon: Ruel Kung, MD;  Location: [facility];  Service: Endoscopy;  Laterality: N/A;   ESOPHAGOGASTRODUODENOSCOPY (EGD) WITH PROPOFOL  N/A 01/25/2019   Procedure: ESOPHAGOGASTRODUODENOSCOPY (EGD) WITH PROPOFOL ;  Surgeon: Kung Ruel, MD;  Location: [facility];  Service: Gastroenterology;  Laterality: N/A;   HIATAL HERNIA REPAIR  02/2016   Dr Shelva   LEFT HEART CATH AND CORONARY ANGIOGRAPHY Left 08/18/2017   Procedure: LEFT HEART CATH AND CORONARY ANGIOGRAPHY;  Surgeon: Bosie Vinie LABOR, MD;  Location: [facility];  Service: Cardiovascular;  Laterality: Left;   LEFT HEART CATH AND CORONARY ANGIOGRAPHY Left 10/15/2015   LUMBAR FUSION  2001   L1-2   POSTERIOR LUMBAR FUSION  03/27/2013   L4-5   POSTERIOR LUMBAR  FUSION  05/09/2014   L3-4   POSTERIOR LUMBAR FUSION  05/30/2015   L2-3   POSTERIOR LUMBAR FUSION  01/21/2017   L1-2, 2-3, 3-4   RIGHT HEART CATH N/A 05/10/2019   Procedure: RIGHT HEART CATH;  Surgeon: Bosie Vinie LABOR, MD;  Location: [facility];  Service: Cardiovascular;  Laterality: N/A;   SPINAL CORD STIMULATOR IMPLANT  02/2022   TOTAL HIP ARTHROPLASTY Left 07/28/2023   Procedure: TOTAL HIP ARTHROPLASTY ANTERIOR APPROACH;  Surgeon: Lorelle Hussar, MD;  Location: [facility];  Service: Orthopedics;  Laterality: Left;   TUMOR EXCISION  1986   back of neck malignant fibrous histiocytoma   VAGINAL HYSTERECTOMY  1992    Current Medications: Active Medications[1]  Allergies:   Sildenafil, Tramadol , Beta adrenergic blockers, Celecoxib, Duloxetine, Gabapentin , Heparin , Hydrocodone -acetaminophen , Indomethacin, Niacin and related, Shellfish allergy, Vicodin [hydrocodone -acetaminophen ], Carbamazepine , and Oxycodone    Social History   Socioeconomic History   Marital status: Married    Spouse name: Christopher   Number of children: 2   Years of education: Not on file   Highest education level: Not on file  Occupational History   Not on file  Tobacco Use   Smoking status: Never   Smokeless tobacco: Never  Vaping Use   Vaping status: Never Used  Substance and Sexual Activity   Alcohol  use: Yes    Alcohol /week: 0.0 standard drinks of alcohol     Comment: 2-3 drinks per year   Drug use: Never   Sexual activity: Not Currently  Other Topics Concern   Not on file  Social History Narrative   Not on file   Social Drivers of Health   Tobacco Use: Low Risk (05/15/2024)   Patient History    Smoking Tobacco Use: Never    Smokeless Tobacco Use: Never    Passive Exposure: Not on file  Financial Resource Strain: Low Risk  (04/23/2024)   Received [facility]   Overall Financial Resource Strain (CARDIA)    Difficulty of Paying Living Expenses: Not hard at all  Food  Insecurity: No Food Insecurity (04/23/2024)   Received from [facility]   Epic    Within the past 12 months, you worried that your food would run out before you got the money to buy more.: Never true    Within the past 12 months, the food you bought just didn't last and you didn't have money to get more.: Never true  Transportation Needs: No Transportation Needs (04/23/2024)   Received from [facility] - Transportation    In  the past 12 months, has lack of transportation kept you from medical appointments or from getting medications?: No    Lack of Transportation (Non-Medical): No  Physical Activity: Not on file  Stress: No Stress Concern Present (01/22/2022)   Received from [facility] - Occupational Stress Questionnaire    Feeling of Stress : Not at all  Social Connections: Moderately Isolated (07/28/2023)   Social Connection and Isolation Panel    Frequency of Communication with Friends and Family: More than three times a week    Frequency of Social Gatherings with Friends and Family: Never    Attends Religious Services: Never    Database Administrator or Organizations: No    Attends Banker Meetings: Never    Marital Status: Married  Depression (PHQ2-9): Not on file  Alcohol  Screen: Not on file  Housing: Low Risk  (04/23/2024)   Received from [facility]   Epic    In the last 12 months, was there a time when you were not able to pay the mortgage or rent on time?: No    In the past 12 months, how many times have you moved where you were living?: 0    At any time in the past 12 months, were you homeless or living in a shelter (including now)?: No  Utilities: Not At Risk (04/23/2024)   Received from [facility]   Epic    In the past 12 months has the electric, gas, oil, or water  company threatened to shut off services in your home?: No  Health  Literacy: Not on file     Family History:  The patient's family history includes Diabetes in an other family member; Heart attack (age of onset: 36) in her father; Heart disease in her father. There is no history of Breast cancer.  ROS:   12-point review of systems is negative unless otherwise noted in the HPI.   EKGs/Labs/Other Studies Reviewed:    Studies reviewed were summarized above. The additional studies were reviewed today:  2D echo 05/01/2024: 1. Left ventricular ejection fraction, by estimation, is 60 to 65%. Left  ventricular ejection fraction by 3D volume is 60 %. Left ventricular  ejection fraction by PLAX is 72 %. The left ventricle has normal function.  The left ventricle has no regional  wall motion abnormalities. Left ventricular diastolic parameters are  consistent with Grade I diastolic dysfunction (impaired relaxation). The  average left ventricular global longitudinal strain is -20.5 %. The global  longitudinal strain is normal.   2. Right ventricular systolic function is normal. The right ventricular  size is normal. Tricuspid regurgitation signal is inadequate for assessing  PA pressure.   3. The mitral valve is normal in structure. Mild to moderate mitral valve  regurgitation. No evidence of mitral stenosis.   4. The aortic valve is normal in structure. Aortic valve regurgitation is  not visualized. Aortic valve sclerosis is present, with no evidence of  aortic valve stenosis.   5. The inferior vena cava is normal in size with greater than 50%  respiratory variability, suggesting right atrial pressure of 3 mmHg.  __________  Zio patch 02/2024: Normal sinus rhythm Patient had a min HR of 45 bpm, max HR of 185 bpm, and avg HR of 79 bpm.    8 Supraventricular Tachycardia runs occurred, the run with the fastest interval lasting 9 beats with a max rate of 185 bpm, the longest  lasting 10 beats with an avg rate of 141 bpm.  Junctional escape beats present.    Isolated SVEs were rare (<1.0%), SVE Couplets were rare (<1.0%), and SVE Triplets  were rare (<1.0%).    Isolated VEs were rare (<1.0%), and no VE Couplets or VE Triplets were present.   Patient triggered events (4) associated with normal sinus rhythm __________  2D echo 06/22/2023 [facility]): NORMAL LEFT VENTRICULAR SYSTOLIC FUNCTION WITH NO LVH  ESTIMATED EF: >55%  NORMAL LA PRESSURES WITH DIASTOLIC DYSFUNCTION (GRADE 1)  NORMAL RIGHT VENTRICULAR SYSTOLIC FUNCTION  VALVULAR REGURGITATION: TRIVIAL AR, MILD MR, TRIVIAL PR, TRIVIAL TR  NO VALVULAR STENOSIS  __________   RHC 05/10/2019 [facility]): Hemodynamic findings consistent with borderline pulmonary hypertension. LV end diastolic pressure is normal.   Borderline pulmonary hypertension Medical manangement __________   2D echo 08/18/2021 [facility]): NORMAL LEFT VENTRICULAR SYSTOLIC FUNCTION  NORMAL RIGHT VENTRICULAR SYSTOLIC FUNCTION  MILD VALVULAR REGURGITATION (MR) NO VALVULAR STENOSIS    2D echo 01/05/2019: 1. The left ventricle has normal systolic function with an ejection  fraction of 60-65%. The cavity size was normal. There is mild concentric  left ventricular hypertrophy. Left ventricular diastolic Doppler  parameters are consistent with impaired  relaxation.   2. The right ventricle has normal systolic function. The cavity was  normal. There is no increase in right ventricular wall thickness. Right  ventricular systolic pressure could not be assessed.   3. The mitral valve is grossly normal.   4. The tricuspid valve is grossly normal.   5. The aortic valve is grossly normal. No stenosis of the aortic valve.   6. The aorta is normal in size and structure.  __________   CPX 11/29/2017: Conclusion: The interpretation of this test is limited due to submaximal effort during the exercise. Based on available data, exercise testing with gas exchange demonstrates low-normal functional capacity when compared to matched  sedentary norms. Patient's body habitus, deconditioning is playing a significant role in her exercise intolerance. However, other submaximal parameters are suggestive of possible circulatory limitation. Note the tachycardia at rest and little to no change in BP during the incremental exercise. __________   48-hour Holter monitor 09/2017 [facility]): Heart Rate:   Average: 74 bpm   Minimum: 48 bpm at 5:37 PM on day 2   Maximum: 134 bpm at 12:52 PM on day 2  Pauses > 2 seconds: 0  Longest RR interval: 1.4 sec at 5:40 PM on day 2 .   Ventricular Ectopy:   Supraventricular Ectopy  Total: 0    total: 150  Isolated: 0    isolated: 150  Couplets: 0    pairs: 0  Runs: 0    runs: 0   Longest: N/A     longest: N/A   Fastest: N/A     fastest: N/A   Summary:  1. Sinus rhythm with rare PACs.  No sustained arrhythmias.  No ventricular ectopy.  No significant abnormalities noted on Holter monitor.  __________   Adenosine cardiac MRI 09/19/2017 ([facility]): 1. The left ventricle is normal in cavity size and wall thickness. Global systolic function is normal, the  calculated LV ejection fraction is 66%. There are no regional wall motion abnormalities. There is a small crypt, possibly the site of a healed VSD, in the mid-chamber anteroseptal wall.   2. The right ventricle is normal in cavity size, wall thickness, and systolic function.   3. Both atria are normal in size.   4. The aortic valve is trileaflet  in morphology with normal opening motion. There is no significant valvular disease.   5. Delayed enhancement imaging demonstrates no evidence of myocardial infarction, scar or infiltrative disease.   6. Adenosine stress perfusion imaging demonstrates no evidence of inducible myocardial ischemia.    7. No intra-cardiac thrombus visualized.  ___________   [facility] 08/18/2017 [facility]): Ost LAD to Prox LAD lesion is 40% stenosed. Prox LAD lesion is 30% stenosed. 1st Mrg lesion is 60% stenosed. Prox RCA  to Mid RCA lesion is 60% stenosed. Mid RCA to Dist RCA lesion is 30% stenosed. The left ventricular systolic function is normal. LV end diastolic pressure is normal. There is no aortic valve stenosis. There is no mitral valve stenosis and no mitral valve prolapse evident.   Patent stents in lad and rca with no critical disease.  Contiue medical management LV normal __________   2D echo 04/08/2016: - Left ventricle: The cavity size was mildly dilated. Wall    thickness was normal. Systolic function was normal. The estimated    ejection fraction was in the range of 60% to 65%. Wall motion was    normal; there were no regional wall motion abnormalities.  - Aortic valve: Valve area (Vmax): 3.06 cm^2.  - Right atrium: The atrium was mildly dilated.   Impressions:   - Normal LVF    Normal Wall Motion    EF=65%    Normal RVF    Mild RVE. Normal study. No cardiac source of emboli was    indentified.  __________   2D echo 02/10/2016 [facility]): NORMAL LEFT VENTRICULAR SYSTOLIC FUNCTION  NORMAL RIGHT VENTRICULAR SYSTOLIC FUNCTION  MILD VALVULAR REGURGITATION  NO VALVULAR STENOSIS  Closest EF: >55% (Estimated)  Mitral: TRIVIAL MR  Aortic: TRIVIAL AR  Tricuspid: MILD TR  __________   [facility] 10/15/2015 [facility]): Findings:  1. Non flow limiting coronary artery disease (as described below)  including 50-60% RCA mid vessel stenosis, and diffuse mild LAD disease  2. Patent RCA patent stents  3. Normal left ventricular filling pressures (LVEPD = 6 mm Hg).  4. Left ventriculogram not performed, echocardiogram on 08/2015 shows  normal LV function   Recommendations:  Medical management of moderate CAD- there are no targets for PCI,   Negative PET stress test 08/2015  Suspect that her symptom complex is multifactorial  Continue medical therapies  ___________   48-hour Holter 09/2015 [facility]): Findings:  The minimum heart rate was approximately 59 bpm  The maximum heart rate was  approximately 126 bpm  The average heart rate was 83 bpm.   There were approximately 0 premature ventricular beat(s)  There were 0 ventricular run(s) noted.  s  There were approximately 10 supraventricular beat(s)  There were 0 SVT run(s) noted   Reported symptoms: No diary submitted   IMPRESSION: Sinus rhythm with rare PACs.  No sustained arrhythmias.  Essentially normal Holter.  __________   2D echo 09/17/2015 [facility]): Left ventricular hypertrophy - mild   Hyperdynamic left ventricular systolic function, ejection fraction 65 to  70%   Degenerative mitral valve disease   Aortic sclerosis   Normal right ventricular systolic function  __________   Myocardial PET 09/17/2015 [facility]): Impressions:  - Normal myocardial perfusion study  - No evidence for significant ischemia or scar is noted.  - During stress: Global systolic function is normal. The ejection fraction  was greater than 65%. Left ventricular chamber size is relatively small.  - Attenuation CT scan shows post PCI findings  - Also noted on attenuation CT  is a large sized hiatal hernia and  gallstones (other findings as per CT 4/18)    EKG:  EKG is ordered today.  The EKG ordered today demonstrates NSR, 88 bpm, low voltage QRS, no acute ST-T changes  Recent Labs: 07/19/2023: ALT 20 03/19/2024: BUN 36; Creatinine, Ser 1.68; Hemoglobin 10.2; Platelets 204; Potassium 4.5; Sodium 137; TSH 2.390  Recent Lipid Panel No results found for: CHOL, TRIG, HDL, CHOLHDL, VLDL, LDLCALC, LDLDIRECT  PHYSICAL EXAM:    VS:  BP 134/65 (BP Location: Left Arm, Patient Position: Sitting, Cuff Size: Normal)   Pulse 88   Ht 5' 2 (1.575 m)   Wt 179 lb 12.8 oz (81.6 kg)   SpO2 97%   BMI 32.89 kg/m   BMI: Body mass index is 32.89 kg/m.  Physical Exam Vitals reviewed.  Constitutional:      Appearance: She is well-developed.  HENT:     Head: Normocephalic and atraumatic.  Eyes:     General:        Right eye: No  discharge.        Left eye: No discharge.  Cardiovascular:     Rate and Rhythm: Normal rate and regular rhythm.     Heart sounds: S1 normal and S2 normal. Heart sounds not distant. No midsystolic click and no opening snap. Murmur heard.     Systolic murmur is present with a grade of 2/6 at the upper left sternal border.     No friction rub.  Pulmonary:     Effort: Pulmonary effort is normal. No respiratory distress.     Breath sounds: Normal breath sounds. No decreased breath sounds, wheezing, rhonchi or rales.  Musculoskeletal:     Cervical back: Normal range of motion.     Right lower leg: No edema.     Left lower leg: No edema.  Skin:    General: Skin is warm and dry.     Nails: There is no clubbing.  Neurological:     Mental Status: She is alert and oriented to person, place, and time.  Psychiatric:        Speech: Speech normal.        Behavior: Behavior normal.        Thought Content: Thought content normal.        Judgment: Judgment normal.     Wt Readings from Last 3 Encounters:  05/15/24 179 lb 12.8 oz (81.6 kg)  03/19/24 176 lb 4 oz (79.9 kg)  07/28/23 175 lb (79.4 kg)     ASSESSMENT & PLAN:   CAD involving native coronary arteries exertional dyspnea and fatigue concerning for anginal equivalent: Currently without symptoms of angina.  Reports symptoms of exertional dyspnea and fatigue leading up to prior PCI of the LAD and RCA.  Most recent cath from 2019 showed in-stent restenosis of the LAD and RCA of up to 40 to 60%.  Given concerning symptoms consistent with prior angina, and in the context of prior cath showing LAD and RCA disease with moderate in-stent restenosis, we have agreed to proceed with diagnostic R/LHC.  She will continue aspirin  81 mg, clopidogrel  75 mg, ezetimibe  10 mg, and atorvastatin  40 mg.  Junctional rhythm: Reduce Cardizem  CD to 120 mg daily.  Junctional rhythm were triggered with symptomatic patient events, therefore I do not believe this is  related to untreated sleep apnea.  Mitral regurgitation: Mild to moderate regurgitation by echo in 04/2024.  Monitor with periodic echo.  HTN: Blood pressure is reasonably controlled in  the office today.  We will reduce Cardizem  CD to 120 mg daily as outlined above with continuation of losartan  50 mg.  HLD: LDL 74 in 11/2023.  Remains on atorvastatin  40 mg and ezetimibe  10 mg.  Untreated OSA: Possibly contributing to fatigue.  Follow-up with sleep medicine as directed.   Informed Consent   Shared Decision Making/Informed Consent{  The risks [stroke (1 in 1000), death (1 in 1000), kidney failure [usually temporary] (1 in 500), bleeding (1 in 200), allergic reaction [possibly serious] (1 in 200)], benefits (diagnostic support and management of coronary artery disease) and alternatives of a cardiac catheterization were discussed in detail with Ms. Skolnik and she is willing to proceed.        Disposition: F/u with Dr. Gollan or an APP in 2-3 weeks post cath.   Medication Adjustments/Labs and Tests Ordered: Current medicines are reviewed at length with the patient today.  Concerns regarding medicines are outlined above. Medication changes, Labs and Tests ordered today are summarized above and listed in the Patient Instructions accessible in Encounters.   Signed, Bernardino Bring, PA-C 05/15/2024 5:46 PM     [facility] [address] [place], [place] [address] [phone]     [1]  Current Meds  Medication Sig   acetaminophen  (TYLENOL ) 500 MG tablet Take 1,000 mg by mouth every 6 (six) hours as needed for mild pain or moderate pain.    acyclovir  (ZOVIRAX ) 400 MG tablet Take 400 mg by mouth daily as needed (for fever blisters).    amitriptyline  (ELAVIL ) 10 MG tablet Take 10 mg by mouth at bedtime.   atorvastatin  (LIPITOR) 40 MG tablet Take 40 mg by mouth at bedtime.   Biotin  10 MG TABS Take 10 mg by mouth daily.   Calcium  Carbonate-Vitamin D  600-400  MG-UNIT tablet Take 1 tablet by mouth daily.   Cholecalciferol (VITAMIN D -3) 125 MCG (5000 UT) TABS Take 5,000 Units by mouth daily.   clopidogrel  (PLAVIX ) 75 MG tablet Take 75 mg by mouth daily.   docusate sodium  (COLACE) 100 MG capsule Take 1 capsule (100 mg total) by mouth 2 (two) times daily.   ezetimibe  (ZETIA ) 10 MG tablet Take 1 tablet by mouth once daily   ferrous sulfate  325 (65 FE) MG tablet Take 325 mg by mouth daily with breakfast.   glipiZIDE  (GLUCOTROL ) 10 MG tablet Take 10 mg by mouth 2 (two) times daily.   insulin  glargine (LANTUS  SOLOSTAR) 100 UNIT/ML Solostar Pen Inject 36 Units into the skin daily. (Patient taking differently: Inject 30-50 Units into the skin daily.)   insulin  lispro (HUMALOG) 100 UNIT/ML KwikPen Inject 0-15 Units into the skin 3 (three) times daily as needed (high blood sugar).   levothyroxine  (SYNTHROID ) 50 MCG tablet Take 50 mcg by mouth daily before breakfast.   LORazepam  (ATIVAN ) 1 MG tablet Take 1 mg by mouth every 8 (eight) hours as needed for anxiety.   losartan  (COZAAR ) 50 MG tablet Take 1 tablet (50 mg total) by mouth daily.   ondansetron  (ZOFRAN ) 4 MG tablet Take 1 tablet (4 mg total) by mouth every 6 (six) hours as needed for nausea.   pantoprazole  (PROTONIX ) 20 MG tablet Take 40 mg by mouth daily.   [DISCONTINUED] diltiazem  (CARDIZEM  CD) 180 MG 24 hr capsule Take 180 mg by mouth daily.

## 2024-05-10 NOTE — Progress Notes (Unsigned)
 Cardiology Office Note    Date:  05/15/2024   ID:  Saphia, Vanderford 11-22-1950, MRN [id number]  PCP:  Auston Reyes BIRCH, MD  Cardiologist:  Evalene Lunger, MD  Electrophysiologist:  None   Chief Complaint: Follow-up  History of Present Illness:   Dorothy Rogers is a 73 y.o. female with history of CAD with prior remote stenting to the LAD and RCA, DM2, chronic dyspnea with paralyzed hemidiaphragm, aortic atherosclerosis, HTN, HLD, CKD stage IV with anemia of chronic disease, IDDM, hypothyroidism, sleep apnea nonadherent to CPAP, and chronic back pain status post nerve stimulator who presents for follow-up of ZIO and echo.  She was previously followed by [facility] cardiology, establishing care with Dr. Gollan in 08/2022.  She has undergone extensive cardiac testing over the years at [place], [place], and through [place] cardiology as outlined below.  Most recent LHC in 07/2017 showed patent stents in the LAD and RCA with otherwise nonobstructive disease as outlined below.  She was evaluated in our office in 03/2023 noting chronic shortness of breath attributed to paralyzed hemidiaphragm that is followed by pulmonology.  Echo in 06/2023, performed through [facility] primary care reported an EF greater than 55%, grade 1 diastolic dysfunction, normal RV systolic function, trivial aortic insufficiency and tricuspid regurgitation with mild mitral regurgitation.  She was evaluated in our office in 02/2024 reporting a several week history of generalized malaise and fatigue.  BP machine at home was reporting heart rates in the 50s bpm at times with BP in the low 100s over 50s.  Chronic dyspnea was stable.  Heart rate of 81 bpm in the office with a blood pressure 120/60.  It was suspected untreated sleep apnea was contributing to her fatigue.  Laboratory evaluation was unrevealing as outlined below.  Chart biopsy showed no evidence of bradycardic rates as outlined below.  It was recommended losartan  be reduced to  50 mg daily with continuation of Cardizem  CD 180 mg daily.   Zio patch in 02/2024 showed a predominant rhythm of sinus with an average rate of 79 bpm, 8 episodes of SVT occurred lasting up to 10 beats, junctional escape beats were present, and rare atrial and ventricular ectopy.  Patient triggered events were associated with normal sinus rhythm.  Echo in 04/2024 showed an EF of 60 to 65%, no regional wall motion abnormalities, grade 1 diastolic dysfunction, normal RV systolic function and ventricular cavity size, mild to moderate mitral regurgitation, aortic valve sclerosis without evidence of insufficiency or stenosis, and a normal CVP.  She comes in today accompanied by her husband and continues to note exertional shortness of breath and fatigue without frank chest pain.  No dizziness, near-syncope, or syncope.  No significant lower extremity swelling or progressive orthopnea.  No falls or symptoms concerning for bleeding.  She reports that symptoms feel similar to her prior angina leading up to her stenting in 2009.  Never had chest pain with stenting.   Labs independently reviewed: 03/2024 - Hgb 10.4, PLT 208, potassium 5.0, BUN 38, serum creatinine 1.8, albumin  4.1, AST/ALT normal, A1c 7.7 02/2024 - TSH normal 11/2023 - TC 136, TG 169, HDL 28, LDL 74, TSH normal  Past Medical History:  Diagnosis Date   Anemia    Anxiety    a.) on BZO PRN (lorazepam )   Aortic atherosclerosis    Arthritis    lower back, hips   Asthma    Blood transfusion without reported diagnosis    CAD (coronary artery disease)  a.) PCI 07/2007 - stents x 2 (unknown type); b.) PCI 03/2008 - stents x 4 (unknown type); c.) cPET 09/16/2017: no LGE; no ischemia; d.) LHC 10/15/2015: 50-60% mRCA, diffuse mild LAD disease. 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   a.) following repair of hiatal hernia; b.) confirmed by sniff test in 2019; c.) has been seen by a specialist in [place]  and advised not a surgical candidate   Diastolic dysfunction    a.) TTE 09/17/2015: EF 65-70%, no RWMAs, G1DD, triv TR/PR; b.) TTE 02/10/2016: EF >55%, no RWMAs, G1DD, triv AR/MR, mild TR/PR; c.) TTE 01/05/2019: EF 60-65%, mild LVH, no RWMAs, G1DD; d.) TTE 08/18/2021: EF >55%, no RWMAs, G1DD, triv PR, mild MR/TR; e.) TTE 06/22/2023: EF >55%, no RWMAs, G1DD, triv AR/TR/PR, mild MR   GERD (gastroesophageal reflux disease)    Herpes simplex    History of coronary angioplasty with insertion of stent 2009   a.) Total of 6 coronary stents as of 07/26/2023   History of hiatal hernia    History of kidney stones    Hyperlipidemia    Hypertension    Hypothyroidism    Intractable nausea and vomiting 06/09/2015   Kyphoscoliosis    Long-term use of aspirin  therapy    Malignant fibrous histiocytoma (posterior neck) 1986   Obstructive sleep apnea    a.) no nocturnal PAP therapy   On long term clopidogrel  therapy    Peripheral neuropathy    Peripheral vascular disease    Pulmonary hypertension (HCC) 05/10/2019   a.) RHC 05/10/2019: mRA 0, mPA 12, mPWCP 0, AO sat 98, PA sat 72.2, CO 3.74, CI 2.04   Status post bilateral cataract extraction    Thrombocytopenia    Type 2 diabetes mellitus without complication    Vertigo    Vitamin B 12 deficiency     Past Surgical History:  Procedure Laterality Date   103 HOUR PH STUDY N/A 02/04/2016   Procedure: 24 HOUR PH STUDY;  Surgeon: Rogelia Copping, MD;  Location: [facility];  Service: Endoscopy;  Laterality: N/A;   CATARACT EXTRACTION W/ INTRAOCULAR  LENS IMPLANT Left 11/14/2013   CATARACT EXTRACTION W/PHACO Right 03/19/2015   Procedure: CATARACT EXTRACTION PHACO AND INTRAOCULAR LENS PLACEMENT (IOC);  Surgeon: Dene Etienne, MD;  Location: [facility];  Service: Ophthalmology;  Laterality: Right;  DIABETIC - insulin  and oral meds   CHOLECYSTECTOMY N/A 05/19/2016   Procedure: LAPAROSCOPIC CHOLECYSTECTOMY;  Surgeon: Carlin Pastel, MD;  Location: [facility];  Service: General;  Laterality: N/A;   CORONARY ANGIOPLASTY WITH STENT PLACEMENT  03/2008   4 stents placed   CORONARY ANGIOPLASTY WITH STENT PLACEMENT  07/2007   2 stents placed   CYSTECTOMY Right    wrist   ESOPHAGEAL MANOMETRY N/A 02/04/2016   Procedure: ESOPHAGEAL MANOMETRY (EM);  Surgeon: Rogelia Copping, MD;  Location: [facility];  Service: Endoscopy;  Laterality: N/A;   ESOPHAGOGASTRODUODENOSCOPY (EGD) WITH PROPOFOL  N/A 09/04/2015   Procedure: ESOPHAGOGASTRODUODENOSCOPY (EGD) WITH PROPOFOL ;  Surgeon: Rogelia Copping, MD;  Location: [facility];  Service: Endoscopy;  Laterality: N/A;  INSULIN  DEPENDENT DIABETIC   ESOPHAGOGASTRODUODENOSCOPY (EGD) WITH PROPOFOL  N/A 06/29/2016   Procedure: ESOPHAGOGASTRODUODENOSCOPY (EGD) WITH PROPOFOL ;  Surgeon: Ruel Kung, MD;  Location: [facility];  Service: Endoscopy;  Laterality: N/A;   ESOPHAGOGASTRODUODENOSCOPY (EGD) WITH PROPOFOL  N/A 01/25/2019   Procedure: ESOPHAGOGASTRODUODENOSCOPY (EGD) WITH PROPOFOL ;  Surgeon: Kung Ruel, MD;  Location: [facility];  Service: Gastroenterology;  Laterality: N/A;   HIATAL HERNIA REPAIR  02/2016   Dr Shelva   LEFT HEART CATH AND CORONARY ANGIOGRAPHY Left 08/18/2017   Procedure: LEFT HEART CATH AND CORONARY ANGIOGRAPHY;  Surgeon: Bosie Vinie LABOR, MD;  Location: [facility];  Service: Cardiovascular;  Laterality: Left;   LEFT HEART CATH AND CORONARY ANGIOGRAPHY Left 10/15/2015   LUMBAR FUSION  2001   L1-2   POSTERIOR LUMBAR FUSION  03/27/2013   L4-5   POSTERIOR LUMBAR  FUSION  05/09/2014   L3-4   POSTERIOR LUMBAR FUSION  05/30/2015   L2-3   POSTERIOR LUMBAR FUSION  01/21/2017   L1-2, 2-3, 3-4   RIGHT HEART CATH N/A 05/10/2019   Procedure: RIGHT HEART CATH;  Surgeon: Bosie Vinie LABOR, MD;  Location: [facility];  Service: Cardiovascular;  Laterality: N/A;   SPINAL CORD STIMULATOR IMPLANT  02/2022   TOTAL HIP ARTHROPLASTY Left 07/28/2023   Procedure: TOTAL HIP ARTHROPLASTY ANTERIOR APPROACH;  Surgeon: Lorelle Hussar, MD;  Location: [facility];  Service: Orthopedics;  Laterality: Left;   TUMOR EXCISION  1986   back of neck malignant fibrous histiocytoma   VAGINAL HYSTERECTOMY  1992    Current Medications: Active Medications[1]  Allergies:   Sildenafil, Tramadol , Beta adrenergic blockers, Celecoxib, Duloxetine, Gabapentin , Heparin , Hydrocodone -acetaminophen , Indomethacin, Niacin and related, Shellfish allergy, Vicodin [hydrocodone -acetaminophen ], Carbamazepine , and Oxycodone    Social History   Socioeconomic History   Marital status: Married    Spouse name: Christopher   Number of children: 2   Years of education: Not on file   Highest education level: Not on file  Occupational History   Not on file  Tobacco Use   Smoking status: Never   Smokeless tobacco: Never  Vaping Use   Vaping status: Never Used  Substance and Sexual Activity   Alcohol  use: Yes    Alcohol /week: 0.0 standard drinks of alcohol     Comment: 2-3 drinks per year   Drug use: Never   Sexual activity: Not Currently  Other Topics Concern   Not on file  Social History Narrative   Not on file   Social Drivers of Health   Tobacco Use: Low Risk (05/15/2024)   Patient History    Smoking Tobacco Use: Never    Smokeless Tobacco Use: Never    Passive Exposure: Not on file  Financial Resource Strain: Low Risk  (04/23/2024)   Received [facility]   Overall Financial Resource Strain (CARDIA)    Difficulty of Paying Living Expenses: Not hard at all  Food  Insecurity: No Food Insecurity (04/23/2024)   Received from [facility]   Epic    Within the past 12 months, you worried that your food would run out before you got the money to buy more.: Never true    Within the past 12 months, the food you bought just didn't last and you didn't have money to get more.: Never true  Transportation Needs: No Transportation Needs (04/23/2024)   Received from [facility] - Transportation    In  the past 12 months, has lack of transportation kept you from medical appointments or from getting medications?: No    Lack of Transportation (Non-Medical): No  Physical Activity: Not on file  Stress: No Stress Concern Present (01/22/2022)   Received from [facility] - Occupational Stress Questionnaire    Feeling of Stress : Not at all  Social Connections: Moderately Isolated (07/28/2023)   Social Connection and Isolation Panel    Frequency of Communication with Friends and Family: More than three times a week    Frequency of Social Gatherings with Friends and Family: Never    Attends Religious Services: Never    Database Administrator or Organizations: No    Attends Banker Meetings: Never    Marital Status: Married  Depression (PHQ2-9): Not on file  Alcohol  Screen: Not on file  Housing: Low Risk  (04/23/2024)   Received from [facility]   Epic    In the last 12 months, was there a time when you were not able to pay the mortgage or rent on time?: No    In the past 12 months, how many times have you moved where you were living?: 0    At any time in the past 12 months, were you homeless or living in a shelter (including now)?: No  Utilities: Not At Risk (04/23/2024)   Received from [facility]   Epic    In the past 12 months has the electric, gas, oil, or water  company threatened to shut off services in your home?: No  Health  Literacy: Not on file     Family History:  The patient's family history includes Diabetes in an other family member; Heart attack (age of onset: 36) in her father; Heart disease in her father. There is no history of Breast cancer.  ROS:   12-point review of systems is negative unless otherwise noted in the HPI.   EKGs/Labs/Other Studies Reviewed:    Studies reviewed were summarized above. The additional studies were reviewed today:  2D echo 05/01/2024: 1. Left ventricular ejection fraction, by estimation, is 60 to 65%. Left  ventricular ejection fraction by 3D volume is 60 %. Left ventricular  ejection fraction by PLAX is 72 %. The left ventricle has normal function.  The left ventricle has no regional  wall motion abnormalities. Left ventricular diastolic parameters are  consistent with Grade I diastolic dysfunction (impaired relaxation). The  average left ventricular global longitudinal strain is -20.5 %. The global  longitudinal strain is normal.   2. Right ventricular systolic function is normal. The right ventricular  size is normal. Tricuspid regurgitation signal is inadequate for assessing  PA pressure.   3. The mitral valve is normal in structure. Mild to moderate mitral valve  regurgitation. No evidence of mitral stenosis.   4. The aortic valve is normal in structure. Aortic valve regurgitation is  not visualized. Aortic valve sclerosis is present, with no evidence of  aortic valve stenosis.   5. The inferior vena cava is normal in size with greater than 50%  respiratory variability, suggesting right atrial pressure of 3 mmHg.  __________  Zio patch 02/2024: Normal sinus rhythm Patient had a min HR of 45 bpm, max HR of 185 bpm, and avg HR of 79 bpm.    8 Supraventricular Tachycardia runs occurred, the run with the fastest interval lasting 9 beats with a max rate of 185 bpm, the longest  lasting 10 beats with an avg rate of 141 bpm.  Junctional escape beats present.    Isolated SVEs were rare (<1.0%), SVE Couplets were rare (<1.0%), and SVE Triplets  were rare (<1.0%).    Isolated VEs were rare (<1.0%), and no VE Couplets or VE Triplets were present.   Patient triggered events (4) associated with normal sinus rhythm __________  2D echo 06/22/2023 [facility]): NORMAL LEFT VENTRICULAR SYSTOLIC FUNCTION WITH NO LVH  ESTIMATED EF: >55%  NORMAL LA PRESSURES WITH DIASTOLIC DYSFUNCTION (GRADE 1)  NORMAL RIGHT VENTRICULAR SYSTOLIC FUNCTION  VALVULAR REGURGITATION: TRIVIAL AR, MILD MR, TRIVIAL PR, TRIVIAL TR  NO VALVULAR STENOSIS  __________   RHC 05/10/2019 [facility]): Hemodynamic findings consistent with borderline pulmonary hypertension. LV end diastolic pressure is normal.   Borderline pulmonary hypertension Medical manangement __________   2D echo 08/18/2021 [facility]): NORMAL LEFT VENTRICULAR SYSTOLIC FUNCTION  NORMAL RIGHT VENTRICULAR SYSTOLIC FUNCTION  MILD VALVULAR REGURGITATION (MR) NO VALVULAR STENOSIS    2D echo 01/05/2019: 1. The left ventricle has normal systolic function with an ejection  fraction of 60-65%. The cavity size was normal. There is mild concentric  left ventricular hypertrophy. Left ventricular diastolic Doppler  parameters are consistent with impaired  relaxation.   2. The right ventricle has normal systolic function. The cavity was  normal. There is no increase in right ventricular wall thickness. Right  ventricular systolic pressure could not be assessed.   3. The mitral valve is grossly normal.   4. The tricuspid valve is grossly normal.   5. The aortic valve is grossly normal. No stenosis of the aortic valve.   6. The aorta is normal in size and structure.  __________   CPX 11/29/2017: Conclusion: The interpretation of this test is limited due to submaximal effort during the exercise. Based on available data, exercise testing with gas exchange demonstrates low-normal functional capacity when compared to matched  sedentary norms. Patient's body habitus, deconditioning is playing a significant role in her exercise intolerance. However, other submaximal parameters are suggestive of possible circulatory limitation. Note the tachycardia at rest and little to no change in BP during the incremental exercise. __________   48-hour Holter monitor 09/2017 [facility]): Heart Rate:   Average: 74 bpm   Minimum: 48 bpm at 5:37 PM on day 2   Maximum: 134 bpm at 12:52 PM on day 2  Pauses > 2 seconds: 0  Longest RR interval: 1.4 sec at 5:40 PM on day 2 .   Ventricular Ectopy:   Supraventricular Ectopy  Total: 0    total: 150  Isolated: 0    isolated: 150  Couplets: 0    pairs: 0  Runs: 0    runs: 0   Longest: N/A     longest: N/A   Fastest: N/A     fastest: N/A   Summary:  1. Sinus rhythm with rare PACs.  No sustained arrhythmias.  No ventricular ectopy.  No significant abnormalities noted on Holter monitor.  __________   Adenosine cardiac MRI 09/19/2017 ([facility]): 1. The left ventricle is normal in cavity size and wall thickness. Global systolic function is normal, the  calculated LV ejection fraction is 66%. There are no regional wall motion abnormalities. There is a small crypt, possibly the site of a healed VSD, in the mid-chamber anteroseptal wall.   2. The right ventricle is normal in cavity size, wall thickness, and systolic function.   3. Both atria are normal in size.   4. The aortic valve is trileaflet  in morphology with normal opening motion. There is no significant valvular disease.   5. Delayed enhancement imaging demonstrates no evidence of myocardial infarction, scar or infiltrative disease.   6. Adenosine stress perfusion imaging demonstrates no evidence of inducible myocardial ischemia.    7. No intra-cardiac thrombus visualized.  ___________   [facility] 08/18/2017 [facility]): Ost LAD to Prox LAD lesion is 40% stenosed. Prox LAD lesion is 30% stenosed. 1st Mrg lesion is 60% stenosed. Prox RCA  to Mid RCA lesion is 60% stenosed. Mid RCA to Dist RCA lesion is 30% stenosed. The left ventricular systolic function is normal. LV end diastolic pressure is normal. There is no aortic valve stenosis. There is no mitral valve stenosis and no mitral valve prolapse evident.   Patent stents in lad and rca with no critical disease.  Contiue medical management LV normal __________   2D echo 04/08/2016: - Left ventricle: The cavity size was mildly dilated. Wall    thickness was normal. Systolic function was normal. The estimated    ejection fraction was in the range of 60% to 65%. Wall motion was    normal; there were no regional wall motion abnormalities.  - Aortic valve: Valve area (Vmax): 3.06 cm^2.  - Right atrium: The atrium was mildly dilated.   Impressions:   - Normal LVF    Normal Wall Motion    EF=65%    Normal RVF    Mild RVE. Normal study. No cardiac source of emboli was    indentified.  __________   2D echo 02/10/2016 [facility]): NORMAL LEFT VENTRICULAR SYSTOLIC FUNCTION  NORMAL RIGHT VENTRICULAR SYSTOLIC FUNCTION  MILD VALVULAR REGURGITATION  NO VALVULAR STENOSIS  Closest EF: >55% (Estimated)  Mitral: TRIVIAL MR  Aortic: TRIVIAL AR  Tricuspid: MILD TR  __________   [facility] 10/15/2015 [facility]): Findings:  1. Non flow limiting coronary artery disease (as described below)  including 50-60% RCA mid vessel stenosis, and diffuse mild LAD disease  2. Patent RCA patent stents  3. Normal left ventricular filling pressures (LVEPD = 6 mm Hg).  4. Left ventriculogram not performed, echocardiogram on 08/2015 shows  normal LV function   Recommendations:  Medical management of moderate CAD- there are no targets for PCI,   Negative PET stress test 08/2015  Suspect that her symptom complex is multifactorial  Continue medical therapies  ___________   48-hour Holter 09/2015 [facility]): Findings:  The minimum heart rate was approximately 59 bpm  The maximum heart rate was  approximately 126 bpm  The average heart rate was 83 bpm.   There were approximately 0 premature ventricular beat(s)  There were 0 ventricular run(s) noted.  s  There were approximately 10 supraventricular beat(s)  There were 0 SVT run(s) noted   Reported symptoms: No diary submitted   IMPRESSION: Sinus rhythm with rare PACs.  No sustained arrhythmias.  Essentially normal Holter.  __________   2D echo 09/17/2015 [facility]): Left ventricular hypertrophy - mild   Hyperdynamic left ventricular systolic function, ejection fraction 65 to  70%   Degenerative mitral valve disease   Aortic sclerosis   Normal right ventricular systolic function  __________   Myocardial PET 09/17/2015 [facility]): Impressions:  - Normal myocardial perfusion study  - No evidence for significant ischemia or scar is noted.  - During stress: Global systolic function is normal. The ejection fraction  was greater than 65%. Left ventricular chamber size is relatively small.  - Attenuation CT scan shows post PCI findings  - Also noted on attenuation CT  is a large sized hiatal hernia and  gallstones (other findings as per CT 4/18)    EKG:  EKG is ordered today.  The EKG ordered today demonstrates NSR, 88 bpm, low voltage QRS, no acute ST-T changes  Recent Labs: 07/19/2023: ALT 20 03/19/2024: BUN 36; Creatinine, Ser 1.68; Hemoglobin 10.2; Platelets 204; Potassium 4.5; Sodium 137; TSH 2.390  Recent Lipid Panel No results found for: CHOL, TRIG, HDL, CHOLHDL, VLDL, LDLCALC, LDLDIRECT  PHYSICAL EXAM:    VS:  BP 134/65 (BP Location: Left Arm, Patient Position: Sitting, Cuff Size: Normal)   Pulse 88   Ht 5' 2 (1.575 m)   Wt 179 lb 12.8 oz (81.6 kg)   SpO2 97%   BMI 32.89 kg/m   BMI: Body mass index is 32.89 kg/m.  Physical Exam Vitals reviewed.  Constitutional:      Appearance: She is well-developed.  HENT:     Head: Normocephalic and atraumatic.  Eyes:     General:        Right eye: No  discharge.        Left eye: No discharge.  Cardiovascular:     Rate and Rhythm: Normal rate and regular rhythm.     Heart sounds: S1 normal and S2 normal. Heart sounds not distant. No midsystolic click and no opening snap. Murmur heard.     Systolic murmur is present with a grade of 2/6 at the upper left sternal border.     No friction rub.  Pulmonary:     Effort: Pulmonary effort is normal. No respiratory distress.     Breath sounds: Normal breath sounds. No decreased breath sounds, wheezing, rhonchi or rales.  Musculoskeletal:     Cervical back: Normal range of motion.     Right lower leg: No edema.     Left lower leg: No edema.  Skin:    General: Skin is warm and dry.     Nails: There is no clubbing.  Neurological:     Mental Status: She is alert and oriented to person, place, and time.  Psychiatric:        Speech: Speech normal.        Behavior: Behavior normal.        Thought Content: Thought content normal.        Judgment: Judgment normal.     Wt Readings from Last 3 Encounters:  05/15/24 179 lb 12.8 oz (81.6 kg)  03/19/24 176 lb 4 oz (79.9 kg)  07/28/23 175 lb (79.4 kg)     ASSESSMENT & PLAN:   CAD involving native coronary arteries exertional dyspnea and fatigue concerning for anginal equivalent: Currently without symptoms of angina.  Reports symptoms of exertional dyspnea and fatigue leading up to prior PCI of the LAD and RCA.  Most recent cath from 2019 showed in-stent restenosis of the LAD and RCA of up to 40 to 60%.  Given concerning symptoms consistent with prior angina, and in the context of prior cath showing LAD and RCA disease with moderate in-stent restenosis, we have agreed to proceed with diagnostic R/LHC.  She will continue aspirin  81 mg, clopidogrel  75 mg, ezetimibe  10 mg, and atorvastatin  40 mg.  Junctional rhythm: Reduce Cardizem  CD to 120 mg daily.  Junctional rhythm were triggered with symptomatic patient events, therefore I do not believe this is  related to untreated sleep apnea.  Mitral regurgitation: Mild to moderate regurgitation by echo in 04/2024.  Monitor with periodic echo.  HTN: Blood pressure is reasonably controlled in  the office today.  We will reduce Cardizem  CD to 120 mg daily as outlined above with continuation of losartan  50 mg.  HLD: LDL 74 in 11/2023.  Remains on atorvastatin  40 mg and ezetimibe  10 mg.  Untreated OSA: Possibly contributing to fatigue.  Follow-up with sleep medicine as directed.   Informed Consent   Shared Decision Making/Informed Consent{  The risks [stroke (1 in 1000), death (1 in 1000), kidney failure [usually temporary] (1 in 500), bleeding (1 in 200), allergic reaction [possibly serious] (1 in 200)], benefits (diagnostic support and management of coronary artery disease) and alternatives of a cardiac catheterization were discussed in detail with Dorothy Rogers and she is willing to proceed.        Disposition: F/u with Dr. Gollan or an APP in 2-3 weeks post cath.   Medication Adjustments/Labs and Tests Ordered: Current medicines are reviewed at length with the patient today.  Concerns regarding medicines are outlined above. Medication changes, Labs and Tests ordered today are summarized above and listed in the Patient Instructions accessible in Encounters.   Signed, Bernardino Bring, PA-C 05/15/2024 5:46 PM     [facility] [address] [place], [place] [address] [phone]     [1]  Current Meds  Medication Sig   acetaminophen  (TYLENOL ) 500 MG tablet Take 1,000 mg by mouth every 6 (six) hours as needed for mild pain or moderate pain.    acyclovir  (ZOVIRAX ) 400 MG tablet Take 400 mg by mouth daily as needed (for fever blisters).    amitriptyline  (ELAVIL ) 10 MG tablet Take 10 mg by mouth at bedtime.   atorvastatin  (LIPITOR) 40 MG tablet Take 40 mg by mouth at bedtime.   Biotin  10 MG TABS Take 10 mg by mouth daily.   Calcium  Carbonate-Vitamin D  600-400  MG-UNIT tablet Take 1 tablet by mouth daily.   Cholecalciferol (VITAMIN D -3) 125 MCG (5000 UT) TABS Take 5,000 Units by mouth daily.   clopidogrel  (PLAVIX ) 75 MG tablet Take 75 mg by mouth daily.   docusate sodium  (COLACE) 100 MG capsule Take 1 capsule (100 mg total) by mouth 2 (two) times daily.   ezetimibe  (ZETIA ) 10 MG tablet Take 1 tablet by mouth once daily   ferrous sulfate  325 (65 FE) MG tablet Take 325 mg by mouth daily with breakfast.   glipiZIDE  (GLUCOTROL ) 10 MG tablet Take 10 mg by mouth 2 (two) times daily.   insulin  glargine (LANTUS  SOLOSTAR) 100 UNIT/ML Solostar Pen Inject 36 Units into the skin daily. (Patient taking differently: Inject 30-50 Units into the skin daily.)   insulin  lispro (HUMALOG) 100 UNIT/ML KwikPen Inject 0-15 Units into the skin 3 (three) times daily as needed (high blood sugar).   levothyroxine  (SYNTHROID ) 50 MCG tablet Take 50 mcg by mouth daily before breakfast.   LORazepam  (ATIVAN ) 1 MG tablet Take 1 mg by mouth every 8 (eight) hours as needed for anxiety.   losartan  (COZAAR ) 50 MG tablet Take 1 tablet (50 mg total) by mouth daily.   ondansetron  (ZOFRAN ) 4 MG tablet Take 1 tablet (4 mg total) by mouth every 6 (six) hours as needed for nausea.   pantoprazole  (PROTONIX ) 20 MG tablet Take 40 mg by mouth daily.   [DISCONTINUED] diltiazem  (CARDIZEM  CD) 180 MG 24 hr capsule Take 180 mg by mouth daily.

## 2024-05-15 ENCOUNTER — Other Ambulatory Visit
Admission: RE | Admit: 2024-05-15 | Discharge: 2024-05-15 | Disposition: A | Source: Ambulatory Visit | Attending: Physician Assistant | Admitting: Physician Assistant

## 2024-05-15 ENCOUNTER — Encounter: Payer: Self-pay | Admitting: Physician Assistant

## 2024-05-15 ENCOUNTER — Ambulatory Visit: Attending: Physician Assistant | Admitting: Physician Assistant

## 2024-05-15 VITALS — BP 134/65 | HR 88 | Ht 62.0 in | Wt 179.8 lb

## 2024-05-15 DIAGNOSIS — R0609 Other forms of dyspnea: Secondary | ICD-10-CM

## 2024-05-15 DIAGNOSIS — I10 Essential (primary) hypertension: Secondary | ICD-10-CM | POA: Diagnosis present

## 2024-05-15 DIAGNOSIS — I25118 Atherosclerotic heart disease of native coronary artery with other forms of angina pectoris: Secondary | ICD-10-CM | POA: Diagnosis not present

## 2024-05-15 DIAGNOSIS — R5383 Other fatigue: Secondary | ICD-10-CM | POA: Insufficient documentation

## 2024-05-15 DIAGNOSIS — G4733 Obstructive sleep apnea (adult) (pediatric): Secondary | ICD-10-CM | POA: Insufficient documentation

## 2024-05-15 DIAGNOSIS — Z79899 Other long term (current) drug therapy: Secondary | ICD-10-CM | POA: Insufficient documentation

## 2024-05-15 DIAGNOSIS — I498 Other specified cardiac arrhythmias: Secondary | ICD-10-CM | POA: Insufficient documentation

## 2024-05-15 DIAGNOSIS — I2089 Other forms of angina pectoris: Secondary | ICD-10-CM

## 2024-05-15 DIAGNOSIS — I340 Nonrheumatic mitral (valve) insufficiency: Secondary | ICD-10-CM | POA: Diagnosis present

## 2024-05-15 DIAGNOSIS — E785 Hyperlipidemia, unspecified: Secondary | ICD-10-CM | POA: Diagnosis present

## 2024-05-15 LAB — CBC
HCT: 32.6 % — ABNORMAL LOW (ref 36.0–46.0)
Hemoglobin: 10.8 g/dL — ABNORMAL LOW (ref 12.0–15.0)
MCH: 30.3 pg (ref 26.0–34.0)
MCHC: 33.1 g/dL (ref 30.0–36.0)
MCV: 91.6 fL (ref 80.0–100.0)
Platelets: 215 K/uL (ref 150–400)
RBC: 3.56 MIL/uL — ABNORMAL LOW (ref 3.87–5.11)
RDW: 13.3 % (ref 11.5–15.5)
WBC: 8.7 K/uL (ref 4.0–10.5)
nRBC: 0 % (ref 0.0–0.2)

## 2024-05-15 LAB — BASIC METABOLIC PANEL WITH GFR
Anion gap: 12 (ref 5–15)
BUN: 34 mg/dL — ABNORMAL HIGH (ref 8–23)
CO2: 25 mmol/L (ref 22–32)
Calcium: 9.4 mg/dL (ref 8.9–10.3)
Chloride: 100 mmol/L (ref 98–111)
Creatinine, Ser: 1.78 mg/dL — ABNORMAL HIGH (ref 0.44–1.00)
GFR, Estimated: 29 mL/min — ABNORMAL LOW (ref >60)
Glucose, Bld: 106 mg/dL — ABNORMAL HIGH (ref 70–99)
Potassium: 4.3 mmol/L (ref 3.5–5.1)
Sodium: 137 mmol/L (ref 135–145)

## 2024-05-15 MED ORDER — DILTIAZEM HCL ER COATED BEADS 120 MG PO CP24: 120.0000 mg | ORAL_CAPSULE | Freq: Every day | ORAL | 3 refills | Status: AC

## 2024-05-15 NOTE — Patient Instructions (Addendum)
 Medication Instructions:  Your physician recommends the following medication changes.  DECREASE: Diltiazem  120 mg daily  *If you need a refill on your cardiac medications before your next appointment, please call your pharmacy*  Lab Work: Your provider would like for you to have following labs drawn today CBC and BMeT in the [facility]   If you have labs (blood work) drawn today and your tests are completely normal, you will receive your results only by: MyChart Message (if you have MyChart) OR A paper copy in the mail If you have any lab test that is abnormal or we need to change your treatment, we will call you to review the results.  Testing/Procedures:  [facility] [place] [address] [place] [address] Dept: [phone] Loc: [phone]  MCKENSI REDINGER  05/15/2024  You are scheduled for a Cardiac Catheterization on Tuesday, December 23 with Dr. Deatrice Cage.  1. Please arrive at the [facility] Entrance of [facility], [address] [place], [place] [address] at 7:00 AM (This is 4.5 hour(s) prior to your procedure time).  Proceed to the Check-In Desk directly inside the entrance.  Procedure Parking: Use the entrance off of the [address] side of the hospital. Turn right upon entering and follow the driveway to parking that is directly in front of the [facility]. There is no valet parking available at this entrance, however there is an awning directly in front of the [facility] for drop off/ pick up for patients.  Special note: Every effort is made to have your procedure done on time. Please understand that emergencies sometimes delay scheduled procedures.  2. Diet: Nothing to eat after midnight.   3. Hydration: You need to be well hydrated before your procedure. On December 23, you may drink approved liquids (see below) until 2  hours before the procedure, with 16 oz of water  as your last intake.   List of approved liquids water , clear juice, clear tea, black coffee, fruit juices, non-citric and without pulp, carbonated beverages, Gatorade, Kool -Aid, plain Jello-O and plain ice popsicles.  4. Labs: Labs drawn today  5. Medication instructions in preparation for your procedure:   Contrast Allergy: No  Take only half of your prescribed units of insulin  the night before your procedure. Do not take any insulin  on the day of the procedure.  Do not take Diabetes Med Glipizide  on the day of the procedure and HOLD 48 HOURS AFTER THE PROCEDURE.  On the morning of your procedure, take your Plavix /Clopidogrel  and any morning medicines NOT listed above.  You may use sips of water .  6. Plan to go home the same day, you will only stay overnight if medically necessary. 7. Bring a current list of your medications and current insurance cards. 8. You MUST have a responsible person to drive you home. 9. Someone MUST be with you the first 24 hours after you arrive home or your discharge will be delayed. 10. Please wear clothes that are easy to get on and off and wear slip-on shoes.  Thank you for allowing us  to care for you!   -- [facility]   Follow-Up: At [facility], you and your health needs are our priority.  As part of our continuing mission to provide you with exceptional heart care, our providers are all part of one team.  This  team includes your primary Cardiologist (physician) and Advanced Practice Providers or APPs (Physician Assistants and Nurse Practitioners) who all work together to provide you with the care you need, when you need it.  Your next appointment:   1-2 week(s) after cath   Provider:   You may see Timothy Gollan, MD or Bernardino Bring, PA-C

## 2024-05-16 ENCOUNTER — Other Ambulatory Visit: Payer: Self-pay | Admitting: Physician Assistant

## 2024-05-16 ENCOUNTER — Telehealth: Payer: Self-pay | Admitting: Cardiovascular Disease

## 2024-05-16 ENCOUNTER — Ambulatory Visit: Payer: Self-pay | Admitting: Physician Assistant

## 2024-05-16 NOTE — Progress Notes (Signed)
 Orders for cardiac cath and prehydration signed and held.

## 2024-05-16 NOTE — Telephone Encounter (Signed)
 Husband Astrid) wanted to report additional patient information prior to patient surgery.  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

## 2024-05-17 NOTE — Telephone Encounter (Signed)
That is fine from my standpoint. 

## 2024-05-17 NOTE — Telephone Encounter (Signed)
 Called the patient and wife. No answer, unable to leave a message.

## 2024-05-17 NOTE — Telephone Encounter (Signed)
 Answered an incoming return call from the patient's husband.  He wanted to let Dr. Darron know that the patient has a spinal cord stimulator.  He stated that he called the Cath Lab and they informed him to call us  to let us  know.  Provided reassurances with all questions and concerns addressed at this time.

## 2024-05-17 NOTE — Telephone Encounter (Signed)
 Called and spoke with Christopher, patient's husband, per DPR to inform him that from Dr. Renato standpoint it is fine that his wife has a spinal cord stimulator.  Christopher verbalized understanding and expressed gratitude for the call.

## 2024-05-22 ENCOUNTER — Ambulatory Visit
Admission: RE | Admit: 2024-05-22 | Discharge: 2024-05-22 | Disposition: A | Discharge status: Home / Self Care | Attending: Cardiovascular Disease | Admitting: Cardiovascular Disease

## 2024-05-22 ENCOUNTER — Encounter: Payer: Self-pay | Admitting: Cardiovascular Disease

## 2024-05-22 ENCOUNTER — Other Ambulatory Visit: Payer: Self-pay

## 2024-05-22 ENCOUNTER — Encounter: Admission: RE | Disposition: A | Payer: Self-pay | Attending: Cardiovascular Disease

## 2024-05-22 DIAGNOSIS — Z79890 Hormone replacement therapy (postmenopausal): Secondary | ICD-10-CM | POA: Diagnosis not present

## 2024-05-22 DIAGNOSIS — Z955 Presence of coronary angioplasty implant and graft: Secondary | ICD-10-CM | POA: Insufficient documentation

## 2024-05-22 DIAGNOSIS — N184 Chronic kidney disease, stage 4 (severe): Secondary | ICD-10-CM | POA: Insufficient documentation

## 2024-05-22 DIAGNOSIS — Z7902 Long term (current) use of antithrombotics/antiplatelets: Secondary | ICD-10-CM | POA: Diagnosis not present

## 2024-05-22 DIAGNOSIS — Z7984 Long term (current) use of oral hypoglycemic drugs: Secondary | ICD-10-CM | POA: Diagnosis not present

## 2024-05-22 DIAGNOSIS — G4733 Obstructive sleep apnea (adult) (pediatric): Secondary | ICD-10-CM | POA: Diagnosis not present

## 2024-05-22 DIAGNOSIS — I700 Atherosclerosis of aorta: Secondary | ICD-10-CM | POA: Diagnosis not present

## 2024-05-22 DIAGNOSIS — Z79899 Other long term (current) drug therapy: Secondary | ICD-10-CM | POA: Diagnosis not present

## 2024-05-22 DIAGNOSIS — J986 Disorders of diaphragm: Secondary | ICD-10-CM | POA: Diagnosis not present

## 2024-05-22 DIAGNOSIS — E1151 Type 2 diabetes mellitus with diabetic peripheral angiopathy without gangrene: Secondary | ICD-10-CM | POA: Diagnosis not present

## 2024-05-22 DIAGNOSIS — Z794 Long term (current) use of insulin: Secondary | ICD-10-CM | POA: Diagnosis not present

## 2024-05-22 DIAGNOSIS — I50810 Right heart failure, unspecified: Secondary | ICD-10-CM | POA: Insufficient documentation

## 2024-05-22 DIAGNOSIS — G8929 Other chronic pain: Secondary | ICD-10-CM | POA: Insufficient documentation

## 2024-05-22 DIAGNOSIS — I130 Hypertensive heart and chronic kidney disease with heart failure and stage 1 through stage 4 chronic kidney disease, or unspecified chronic kidney disease: Secondary | ICD-10-CM | POA: Insufficient documentation

## 2024-05-22 DIAGNOSIS — E785 Hyperlipidemia, unspecified: Secondary | ICD-10-CM | POA: Diagnosis not present

## 2024-05-22 DIAGNOSIS — E039 Hypothyroidism, unspecified: Secondary | ICD-10-CM | POA: Diagnosis not present

## 2024-05-22 DIAGNOSIS — I2729 Other secondary pulmonary hypertension: Secondary | ICD-10-CM | POA: Insufficient documentation

## 2024-05-22 DIAGNOSIS — I25118 Atherosclerotic heart disease of native coronary artery with other forms of angina pectoris: Secondary | ICD-10-CM | POA: Diagnosis not present

## 2024-05-22 DIAGNOSIS — R0602 Shortness of breath: Secondary | ICD-10-CM | POA: Diagnosis present

## 2024-05-22 DIAGNOSIS — I2582 Chronic total occlusion of coronary artery: Secondary | ICD-10-CM | POA: Insufficient documentation

## 2024-05-22 DIAGNOSIS — E1122 Type 2 diabetes mellitus with diabetic chronic kidney disease: Secondary | ICD-10-CM | POA: Diagnosis not present

## 2024-05-22 DIAGNOSIS — D631 Anemia in chronic kidney disease: Secondary | ICD-10-CM | POA: Diagnosis not present

## 2024-05-22 DIAGNOSIS — I340 Nonrheumatic mitral (valve) insufficiency: Secondary | ICD-10-CM | POA: Diagnosis not present

## 2024-05-22 HISTORY — PX: RIGHT/LEFT HEART CATH AND CORONARY ANGIOGRAPHY: CATH118266

## 2024-05-22 LAB — POCT I-STAT EG7
Acid-base deficit: 1 mmol/L (ref 0.0–2.0)
Bicarbonate: 23.5 mmol/L (ref 20.0–28.0)
Calcium, Ion: 1.18 mmol/L (ref 1.15–1.40)
HCT: 27 % — ABNORMAL LOW (ref 36.0–46.0)
Hemoglobin: 9.2 g/dL — ABNORMAL LOW (ref 12.0–15.0)
O2 Saturation: 63 %
Potassium: 4.4 mmol/L (ref 3.5–5.1)
Sodium: 140 mmol/L (ref 135–145)
TCO2: 25 mmol/L (ref 22–32)
pCO2, Ven: 39.4 mmHg — ABNORMAL LOW (ref 44–60)
pH, Ven: 7.383 (ref 7.25–7.43)
pO2, Ven: 33 mmHg (ref 32–45)

## 2024-05-22 LAB — POCT I-STAT 7, (LYTES, BLD GAS, ICA,H+H)
Acid-base deficit: 3 mmol/L — ABNORMAL HIGH (ref 0.0–2.0)
Bicarbonate: 21.7 mmol/L (ref 20.0–28.0)
Calcium, Ion: 1.16 mmol/L (ref 1.15–1.40)
HCT: 27 % — ABNORMAL LOW (ref 36.0–46.0)
Hemoglobin: 9.2 g/dL — ABNORMAL LOW (ref 12.0–15.0)
O2 Saturation: 92 %
Potassium: 4.4 mmol/L (ref 3.5–5.1)
Sodium: 141 mmol/L (ref 135–145)
TCO2: 23 mmol/L (ref 22–32)
pCO2 arterial: 34.6 mmHg (ref 32–48)
pH, Arterial: 7.405 (ref 7.35–7.45)
pO2, Arterial: 62 mmHg — ABNORMAL LOW (ref 83–108)

## 2024-05-22 LAB — GLUCOSE, CAPILLARY
Glucose-Capillary: 217 mg/dL — ABNORMAL HIGH (ref 70–99)
Glucose-Capillary: 204 mg/dL — ABNORMAL HIGH (ref 70–99)

## 2024-05-22 SURGERY — RIGHT/LEFT HEART CATH AND CORONARY ANGIOGRAPHY
Anesthesia: Moderate Sedation | Laterality: Bilateral

## 2024-05-22 MED ORDER — ONDANSETRON HCL 4 MG/2ML IJ SOLN: 4.0000 mg | Freq: Four times a day (QID) | INTRAMUSCULAR | Status: DC | PRN

## 2024-05-22 MED ORDER — VERAPAMIL HCL 2.5 MG/ML IV SOLN
INTRAVENOUS | Status: DC | PRN
  Administered 2024-05-22: 2.5 mg via INTRA_ARTERIAL

## 2024-05-22 MED ORDER — IOHEXOL 300 MG/ML  SOLN
INTRAMUSCULAR | Status: DC | PRN
  Administered 2024-05-22: 42 mL

## 2024-05-22 MED ORDER — VERAPAMIL HCL 2.5 MG/ML IV SOLN
INTRAVENOUS | Status: AC
  Filled 2024-05-22: qty 2

## 2024-05-22 MED ORDER — HEPARIN SODIUM (PORCINE) 1000 UNIT/ML IJ SOLN
INTRAMUSCULAR | Status: AC
  Filled 2024-05-22: qty 10

## 2024-05-22 MED ORDER — LIDOCAINE HCL 1 % IJ SOLN
INTRAMUSCULAR | Status: AC
  Filled 2024-05-22: qty 20

## 2024-05-22 MED ORDER — SODIUM CHLORIDE 0.9 % IV SOLN: INTRAVENOUS | Status: AC

## 2024-05-22 MED ORDER — ASPIRIN 81 MG PO CHEW: 81.0000 mg | CHEWABLE_TABLET | ORAL | Status: AC

## 2024-05-22 MED ORDER — MIDAZOLAM HCL 2 MG/2ML IJ SOLN
INTRAMUSCULAR | Status: AC
  Filled 2024-05-22: qty 2

## 2024-05-22 MED ORDER — HEPARIN (PORCINE) IN NACL 1000-0.9 UT/500ML-% IV SOLN
INTRAVENOUS | Status: DC | PRN
  Administered 2024-05-22: 1000 mL

## 2024-05-22 MED ORDER — FENTANYL CITRATE (PF) 100 MCG/2ML IJ SOLN
INTRAMUSCULAR | Status: AC
  Filled 2024-05-22: qty 2

## 2024-05-22 MED ORDER — HEPARIN (PORCINE) IN NACL 1000-0.9 UT/500ML-% IV SOLN
INTRAVENOUS | Status: AC
  Filled 2024-05-22: qty 1000

## 2024-05-22 MED ORDER — LIDOCAINE HCL (PF) 1 % IJ SOLN
INTRAMUSCULAR | Status: DC | PRN
  Administered 2024-05-22 (×2): 2 mL

## 2024-05-22 MED ORDER — HEPARIN SODIUM (PORCINE) 1000 UNIT/ML IJ SOLN
INTRAMUSCULAR | Status: DC | PRN
  Administered 2024-05-22: 4000 [IU] via INTRAVENOUS

## 2024-05-22 MED ORDER — SODIUM CHLORIDE 0.9% FLUSH: 3.0000 mL | INTRAVENOUS | Status: DC | PRN

## 2024-05-22 MED ORDER — ASPIRIN 81 MG PO CHEW
CHEWABLE_TABLET | ORAL | Status: AC
  Administered 2024-05-22: 81 mg via ORAL
  Filled 2024-05-22: qty 1

## 2024-05-22 MED ORDER — SODIUM CHLORIDE 0.9% FLUSH: 3.0000 mL | Freq: Two times a day (BID) | INTRAVENOUS | Status: DC

## 2024-05-22 MED ORDER — MIDAZOLAM HCL (PF) 2 MG/2ML IJ SOLN
INTRAMUSCULAR | Status: DC | PRN
  Administered 2024-05-22: 1 mg via INTRAVENOUS

## 2024-05-22 MED ORDER — FENTANYL CITRATE (PF) 100 MCG/2ML IJ SOLN
INTRAMUSCULAR | Status: DC | PRN
  Administered 2024-05-22: 25 ug via INTRAVENOUS

## 2024-05-22 MED ORDER — SODIUM CHLORIDE 0.9 % IV SOLN: 250.0000 mL | INTRAVENOUS | Status: DC | PRN

## 2024-05-22 SURGICAL SUPPLY — 11 items
CATH 5FR JL3.5 JR4 ANG PIG MP (CATHETERS) IMPLANT
CATH BALLN WEDGE 5F 110CM (CATHETERS) IMPLANT
CATH INFINITI AMBI 5FR JK (CATHETERS) IMPLANT
DEVICE RAD TR BAND REGULAR (VASCULAR PRODUCTS) IMPLANT
DRAPE BRACHIAL (DRAPES) IMPLANT
GLIDESHEATH SLEND SS 6F .021 (SHEATH) IMPLANT
GUIDEWIRE INQWIRE 1.5J.035X260 (WIRE) IMPLANT
PACK CARDIAC CATH (CUSTOM PROCEDURE TRAY) ×1 IMPLANT
SET ATX-X65L (MISCELLANEOUS) IMPLANT
SHEATH GLIDE SLENDER 4/5FR (SHEATH) IMPLANT
STATION PROTECTION PRESSURIZED (MISCELLANEOUS) IMPLANT

## 2024-05-22 NOTE — Interval H&P Note (Signed)
 History and Physical Interval Note:  05/22/2024 11:33 AM  Dorothy Rogers  has presented today for surgery, with the diagnosis of R and L Cath    Shortness of breath on exertion PT NEEDS 4 HRS PREHYDRATION   Arrive 7a.  The various methods of treatment have been discussed with the patient and family. After consideration of risks, benefits and other options for treatment, the patient has consented to  Procedures: RIGHT/LEFT HEART CATH AND CORONARY ANGIOGRAPHY (Bilateral) as a surgical intervention.  The patient's history has been reviewed, patient examined, no change in status, stable for surgery.  I have reviewed the patient's chart and labs.  Questions were answered to the patient's satisfaction.     Kaela Beitz

## 2024-05-23 ENCOUNTER — Encounter: Payer: Self-pay | Admitting: Cardiovascular Disease

## 2024-06-04 NOTE — Progress Notes (Unsigned)
 "  Cardiology Office Note    Date:  06/06/2024   ID:  Adiyah, Lame 12/24/1950, MRN [id number]  PCP:  Auston Reyes BIRCH, MD  Cardiologist:  Evalene Lunger, MD  Electrophysiologist:  None   Chief Complaint: Follow up  History of Present Illness:   Dorothy Rogers is a 74 y.o. female with history of CAD with prior remote stenting to the LAD and RCA, DM2, chronic dyspnea with paralyzed hemidiaphragm, aortic atherosclerosis, HTN, HLD, CKD stage IV with anemia of chronic disease, IDDM, hypothyroidism, sleep apnea nonadherent to CPAP, and chronic back pain status post nerve stimulator who presents for follow-up of R/LHC.  She was previously followed by [facility] cardiology, establishing care with Dr. Gollan in 08/2022.  She has undergone extensive cardiac testing over the years at [place], [place], and through [place] [facility] as outlined below.  Most recent LHC in 07/2017 showed patent stents in the LAD and RCA with otherwise nonobstructive disease as outlined below.  She was evaluated in our office in 03/2023 noting chronic shortness of breath attributed to paralyzed hemidiaphragm that is followed by pulmonology.  Echo in 06/2023, performed through [facility] primary care reported an EF greater than 55%, grade 1 diastolic dysfunction, normal RV systolic function, trivial aortic insufficiency and tricuspid regurgitation with mild mitral regurgitation.    She was evaluated in our office in 02/2024 reporting a several week history of generalized malaise and fatigue.  BP machine at home was reporting heart rates in the 50s bpm at times with BP in the low 100s over 50s.  Chronic dyspnea was stable.  Heart rate of 81 bpm in the office with a blood pressure 120/60.  It was suspected untreated sleep apnea was contributing to her fatigue.  Laboratory evaluation was unrevealing.  Chart biopsy showed no evidence of bradycardic rates.  It was recommended losartan  be reduced to 50 mg daily with continuation of Cardizem   CD 180 mg daily.  Zio patch in 02/2024 showed a predominant rhythm of sinus with an average rate of 79 bpm, 8 episodes of SVT occurred lasting up to 10 beats, junctional escape beats were present, and rare atrial and ventricular ectopy.  Patient triggered events were associated with normal sinus rhythm.  Echo in 04/2024 showed an EF of 60 to 65%, no regional wall motion abnormalities, grade 1 diastolic dysfunction, normal RV systolic function and ventricular cavity size, mild to moderate mitral regurgitation, aortic valve sclerosis without evidence of insufficiency or stenosis, and a normal CVP.  She followed up in the office in 04/2024 continuing to note exertional shortness of breath and fatigue.  Cardizem  CD was reduced to 120 mg daily given junctional beats noted on outpatient cardiac monitoring.  R/LHC on 05/22/2024 showed an occluded proximal RCA with left-to-right collaterals to the distal RCA only given occluded RCA stents all the way distally.  The LAD stent was patent with mild in-stent restenosis.  There was moderate mid LCx disease.  RHC showed mildly elevated filling pressures, mild pulm hypertension, and normal cardiac output (following IV hydration precath).  Medical therapy is recommended for CAD without enough disease to justify CABG.  The RCA CTO was felt to have very little chance of success with PCI due to full metal jacket from the proximal to distal artery.  She comes in accompanied by her husband today and continues to note unchanged longstanding exertional shortness of breath and fatigue that have been present since approximately 2018 or 2019.  No frank chest pain.  No dizziness,  presyncope, or syncope.  No lower extremity swelling or progressive orthopnea.  No symptoms concerning for bleeding.  No right radial arteriotomy site complications.  Weight stable.   Labs independently reviewed: 04/2024 - Hgb 10.8, PLT 215, potassium 4.3, BUN 34, serum creatinine 1.78, magnesium  1.8, albumin   4.3, AST/ALT normal 03/2024 - A1c 7.7 02/2024 - TSH normal 11/2023 - TC 136, TG 169, HDL 28, LDL 74, TSH normal  Past Medical History:  Diagnosis Date   Anemia    Anxiety    a.) on BZO PRN (lorazepam )   Aortic atherosclerosis    Arthritis    lower back, hips   Asthma    Blood transfusion without reported diagnosis    CAD (coronary artery disease)    a.) PCI 07/2007 - stents x 2 (unknown type); b.) PCI 03/2008 - stents x 4 (unknown type); c.) cPET 09/16/2017: no LGE; no ischemia; d.) LHC 10/15/2015: 50-60% mRCA, diffuse mild LAD disease. 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   a.) following repair of hiatal hernia; b.) confirmed by sniff test in 2019; c.) has been seen by a specialist in [place]  and advised not a surgical candidate   Diastolic dysfunction    a.) TTE 09/17/2015: EF 65-70%, no RWMAs, G1DD, triv TR/PR; b.) TTE 02/10/2016: EF >55%, no RWMAs, G1DD, triv AR/MR, mild TR/PR; c.) TTE 01/05/2019: EF 60-65%, mild LVH, no RWMAs, G1DD; d.) TTE 08/18/2021: EF >55%, no RWMAs, G1DD, triv PR, mild MR/TR; e.) TTE 06/22/2023: EF >55%, no RWMAs, G1DD, triv AR/TR/PR, mild MR   GERD (gastroesophageal reflux disease)    Herpes simplex    History of coronary angioplasty with insertion of stent 2009   a.) Total of 6 coronary stents as of 07/26/2023   History of hiatal hernia    History of kidney stones    Hyperlipidemia    Hypertension    Hypothyroidism    Intractable nausea and vomiting 06/09/2015   Kyphoscoliosis    Long-term use of  aspirin  therapy    Malignant fibrous histiocytoma (posterior neck) 1986   Obstructive sleep apnea    a.) no nocturnal PAP therapy   On long term clopidogrel  therapy    Peripheral neuropathy    Peripheral vascular disease    Pulmonary hypertension (HCC) 05/10/2019   a.) RHC 05/10/2019: mRA 0, mPA 12, mPWCP 0, AO sat 98, PA sat 72.2, CO 3.74, CI 2.04   Status post bilateral cataract extraction    Thrombocytopenia    Type 2 diabetes mellitus without complication    Vertigo    Vitamin B 12 deficiency     Past Surgical History:  Procedure Laterality Date   28 HOUR PH STUDY N/A 02/04/2016   Procedure: 24 HOUR PH STUDY;  Surgeon: Rogelia Copping, MD;  Location: [facility];  Service: Endoscopy;  Laterality: N/A;   CATARACT EXTRACTION W/ INTRAOCULAR LENS IMPLANT Left 11/14/2013   CATARACT EXTRACTION W/PHACO Right 03/19/2015   Procedure: CATARACT EXTRACTION PHACO AND INTRAOCULAR LENS PLACEMENT (IOC);  Surgeon: Dene  Brasington, MD;  Location: [facility];  Service: Ophthalmology;  Laterality: Right;  DIABETIC - insulin  and oral meds   CHOLECYSTECTOMY N/A 05/19/2016   Procedure: LAPAROSCOPIC CHOLECYSTECTOMY;  Surgeon: Carlin Pastel, MD;  Location: [facility];  Service: General;  Laterality: N/A;   CORONARY ANGIOPLASTY WITH STENT PLACEMENT  03/2008   4 stents placed   CORONARY ANGIOPLASTY WITH STENT PLACEMENT  07/2007   2 stents placed   CYSTECTOMY Right    wrist   ESOPHAGEAL MANOMETRY N/A 02/04/2016   Procedure: ESOPHAGEAL MANOMETRY (EM);  Surgeon: Rogelia Copping, MD;  Location: [facility];  Service: Endoscopy;  Laterality: N/A;   ESOPHAGOGASTRODUODENOSCOPY (EGD) WITH PROPOFOL  N/A 09/04/2015   Procedure: ESOPHAGOGASTRODUODENOSCOPY (EGD) WITH PROPOFOL ;  Surgeon: Rogelia Copping, MD;  Location: [facility];  Service: Endoscopy;  Laterality: N/A;  INSULIN  DEPENDENT DIABETIC   ESOPHAGOGASTRODUODENOSCOPY (EGD) WITH PROPOFOL  N/A 06/29/2016   Procedure: ESOPHAGOGASTRODUODENOSCOPY  (EGD) WITH PROPOFOL ;  Surgeon: Ruel Kung, MD;  Location: [facility];  Service: Endoscopy;  Laterality: N/A;   ESOPHAGOGASTRODUODENOSCOPY (EGD) WITH PROPOFOL  N/A 01/25/2019   Procedure: ESOPHAGOGASTRODUODENOSCOPY (EGD) WITH PROPOFOL ;  Surgeon: Kung Ruel, MD;  Location: [facility];  Service: Gastroenterology;  Laterality: N/A;   HIATAL HERNIA REPAIR  02/2016   Dr Pastel   LEFT HEART CATH AND CORONARY ANGIOGRAPHY Left 08/18/2017   Procedure: LEFT HEART CATH AND CORONARY ANGIOGRAPHY;  Surgeon: Bosie Vinie LABOR, MD;  Location: [facility];  Service: Cardiovascular;  Laterality: Left;   LEFT HEART CATH AND CORONARY ANGIOGRAPHY Left 10/15/2015   LUMBAR FUSION  2001   L1-2   POSTERIOR LUMBAR FUSION  03/27/2013   L4-5   POSTERIOR LUMBAR FUSION  05/09/2014   L3-4   POSTERIOR LUMBAR FUSION  05/30/2015   L2-3   POSTERIOR LUMBAR FUSION  01/21/2017   L1-2, 2-3, 3-4   RIGHT HEART CATH N/A 05/10/2019   Procedure: RIGHT HEART CATH;  Surgeon: Bosie Vinie LABOR, MD;  Location: [facility];  Service: Cardiovascular;  Laterality: N/A;   RIGHT/LEFT HEART CATH AND CORONARY ANGIOGRAPHY Bilateral 05/22/2024   Procedure: RIGHT/LEFT HEART CATH AND CORONARY ANGIOGRAPHY;  Surgeon: Darron Deatrice LABOR, MD;  Location: [facility];  Service: Cardiovascular;  Laterality: Bilateral;   SPINAL CORD STIMULATOR IMPLANT  02/2022   TOTAL HIP ARTHROPLASTY Left 07/28/2023   Procedure: TOTAL HIP ARTHROPLASTY ANTERIOR APPROACH;  Surgeon: Lorelle Hussar, MD;  Location: [facility];  Service: Orthopedics;  Laterality: Left;   TUMOR EXCISION  1986   back of neck malignant fibrous histiocytoma   VAGINAL HYSTERECTOMY  1992    Current Medications: Active Medications[1]  Allergies:   Sildenafil, Tramadol , Beta adrenergic blockers, Celecoxib, Duloxetine, Gabapentin , Heparin , Hydrocodone -acetaminophen , Indomethacin, Niacin and related, Shellfish allergy, Vicodin [hydrocodone -acetaminophen ],  Carbamazepine , and Oxycodone    Social History   Socioeconomic History   Marital status: Married    Spouse name: Christopher   Number of children: 2   Years of education: Not on file   Highest education level: Not on file  Occupational History   Not on file  Tobacco Use   Smoking status: Never   Smokeless tobacco: Never  Vaping Use   Vaping status: Never Used  Substance and Sexual Activity   Alcohol  use: Yes    Alcohol /week: 0.0 standard drinks of alcohol     Comment: 2-3 drinks per year   Drug use: Never   Sexual activity: Not Currently  Other Topics Concern   Not on file  Social History Narrative   Not on file  Social Drivers of Health   Tobacco Use: Low Risk (05/22/2024)   Patient History    Smoking Tobacco Use: Never    Smokeless Tobacco Use: Never    Passive Exposure: Not on file  Financial Resource Strain: Low Risk  (04/23/2024)   Received from [facility]   Overall Financial Resource Strain (CARDIA)    Difficulty of Paying Living Expenses: Not hard at all  Food Insecurity: No Food Insecurity (04/23/2024)   Received from [facility]   Epic    Within the past 12 months, you worried that your food would run out before you got the money to buy more.: Never true    Within the past 12 months, the food you bought just didn't last and you didn't have money to get more.: Never true  Transportation Needs: No Transportation Needs (04/23/2024)   Received from [facility] - Transportation    In the past 12 months, has lack of transportation kept you from medical appointments or from getting medications?: No    Lack of Transportation (Non-Medical): No  Physical Activity: Not on file  Stress: No Stress Concern Present (01/22/2022)   Received from [facility] - Occupational Stress Questionnaire    Feeling of Stress : Not at all  Social Connections: Moderately Isolated  (07/28/2023)   Social Connection and Isolation Panel    Frequency of Communication with Friends and Family: More than three times a week    Frequency of Social Gatherings with Friends and Family: Never    Attends Religious Services: Never    Database Administrator or Organizations: No    Attends Banker Meetings: Never    Marital Status: Married  Depression (PHQ2-9): Not on file  Alcohol  Screen: Not on file  Housing: Low Risk  (04/23/2024)   Received from [facility]   Epic    In the last 12 months, was there a time when you were not able to pay the mortgage or rent on time?: No    In the past 12 months, how many times have you moved where you were living?: 0    At any time in the past 12 months, were you homeless or living in a shelter (including now)?: No  Utilities: Not At Risk (04/23/2024)   Received from [facility]   Epic    In the past 12 months has the electric, gas, oil, or water  company threatened to shut off services in your home?: No  Health Literacy: Not on file     Family History:  The patient's family history includes Diabetes in an other family member; Heart attack (age of onset: 25) in her father; Heart disease in her father. There is no history of Breast cancer.  ROS:   12-point review of systems is negative unless otherwise noted in the HPI.   EKGs/Labs/Other Studies Reviewed:    Studies reviewed were summarized above. The additional studies were reviewed today:  R/LHC 05/22/2024:   Ost RCA to Prox RCA lesion is 95% stenosed.   Prox RCA-1 lesion is 60% stenosed.   Prox RCA-2 lesion is 99% stenosed.   Mid RCA lesion is 100% stenosed.   Mid RCA to Dist RCA lesion is 100% stenosed.   Ost LM lesion is 20% stenosed.   Dist LAD lesion is 50% stenosed.   Mid Cx lesion is 60% stenosed.   Prox LAD  to Mid LAD lesion is 20% stenosed.   Mid LAD lesion is 30% stenosed.   1.  Occluded proximal right coronary artery  with left-to-right collaterals to the distal RCA only given occluded RCA stents all the way distally.  The LAD stent is patent with mild in-stent restenosis.  There is moderate mid left circumflex stenosis. 2.  Left ventricular angiography was not performed due to chronic kidney disease.  EF was normal by echo. 3.  Right heart catheterization showed mildly elevated filling pressures, mild pulmonary hypertension and normal cardiac output.   Recommendations: Recommend medical therapy for coronary artery disease.  Not enough disease to justify CABG.  Right coronary artery is a chronic total occlusion with very little chance of success with PCI due to full metal jacket from the proximal to the distal right coronary artery. __________  2D echo 05/01/2024: 1. Left ventricular ejection fraction, by estimation, is 60 to 65%. Left  ventricular ejection fraction by 3D volume is 60 %. Left ventricular  ejection fraction by PLAX is 72 %. The left ventricle has normal function.  The left ventricle has no regional  wall motion abnormalities. Left ventricular diastolic parameters are  consistent with Grade I diastolic dysfunction (impaired relaxation). The  average left ventricular global longitudinal strain is -20.5 %. The global  longitudinal strain is normal.   2. Right ventricular systolic function is normal. The right ventricular  size is normal. Tricuspid regurgitation signal is inadequate for assessing  PA pressure.   3. The mitral valve is normal in structure. Mild to moderate mitral valve  regurgitation. No evidence of mitral stenosis.   4. The aortic valve is normal in structure. Aortic valve regurgitation is  not visualized. Aortic valve sclerosis is present, with no evidence of  aortic valve stenosis.   5. The inferior vena cava is normal in size with greater than 50%  respiratory variability, suggesting right atrial pressure of 3 mmHg.  __________   Zio patch 02/2024: Normal sinus  rhythm Patient had a min HR of 45 bpm, max HR of 185 bpm, and avg HR of 79 bpm.    8 Supraventricular Tachycardia runs occurred, the run with the fastest interval lasting 9 beats with a max rate of 185 bpm, the longest lasting 10 beats with an avg rate of 141 bpm.  Junctional escape beats present.   Isolated SVEs were rare (<1.0%), SVE Couplets were rare (<1.0%), and SVE Triplets  were rare (<1.0%).    Isolated VEs were rare (<1.0%), and no VE Couplets or VE Triplets were present.   Patient triggered events (4) associated with normal sinus rhythm __________   2D echo 06/22/2023 [facility]): NORMAL LEFT VENTRICULAR SYSTOLIC FUNCTION WITH NO LVH  ESTIMATED EF: >55%  NORMAL LA PRESSURES WITH DIASTOLIC DYSFUNCTION (GRADE 1)  NORMAL RIGHT VENTRICULAR SYSTOLIC FUNCTION  VALVULAR REGURGITATION: TRIVIAL AR, MILD MR, TRIVIAL PR, TRIVIAL TR  NO VALVULAR STENOSIS  __________   RHC 05/10/2019 [facility]): Hemodynamic findings consistent with borderline pulmonary hypertension. LV end diastolic pressure is normal.   Borderline pulmonary hypertension Medical manangement __________   2D echo 08/18/2021 [facility]): NORMAL LEFT VENTRICULAR SYSTOLIC FUNCTION  NORMAL RIGHT VENTRICULAR SYSTOLIC FUNCTION  MILD VALVULAR REGURGITATION (MR) NO VALVULAR STENOSIS    2D echo 01/05/2019: 1. The left ventricle has normal systolic function with an ejection  fraction of 60-65%. The cavity size was normal. There is mild concentric  left ventricular hypertrophy. Left ventricular diastolic Doppler  parameters are consistent with impaired  relaxation.  2. The right ventricle has normal systolic function. The cavity was  normal. There is no increase in right ventricular wall thickness. Right  ventricular systolic pressure could not be assessed.   3. The mitral valve is grossly normal.   4. The tricuspid valve is grossly normal.   5. The aortic valve is grossly normal. No stenosis of the aortic valve.   6. The  aorta is normal in size and structure.  __________   CPX 11/29/2017: Conclusion: The interpretation of this test is limited due to submaximal effort during the exercise. Based on available data, exercise testing with gas exchange demonstrates low-normal functional capacity when compared to matched sedentary norms. Patient's body habitus, deconditioning is playing a significant role in her exercise intolerance. However, other submaximal parameters are suggestive of possible circulatory limitation. Note the tachycardia at rest and little to no change in BP during the incremental exercise. __________   48-hour Holter monitor 09/2017 [facility]): Heart Rate:   Average: 74 bpm   Minimum: 48 bpm at 5:37 PM on day 2   Maximum: 134 bpm at 12:52 PM on day 2  Pauses > 2 seconds: 0  Longest RR interval: 1.4 sec at 5:40 PM on day 2 .   Ventricular Ectopy:   Supraventricular Ectopy  Total: 0    total: 150  Isolated: 0    isolated: 150  Couplets: 0    pairs: 0  Runs: 0    runs: 0   Longest: N/A     longest: N/A   Fastest: N/A     fastest: N/A   Summary:  1. Sinus rhythm with rare PACs.  No sustained arrhythmias.  No ventricular ectopy.  No significant abnormalities noted on Holter monitor.  __________   Adenosine cardiac MRI 09/19/2017 ([facility]): 1. The left ventricle is normal in cavity size and wall thickness. Global systolic function is normal, the  calculated LV ejection fraction is 66%. There are no regional wall motion abnormalities. There is a small crypt, possibly the site of a healed VSD, in the mid-chamber anteroseptal wall.   2. The right ventricle is normal in cavity size, wall thickness, and systolic function.   3. Both atria are normal in size.   4. The aortic valve is trileaflet in morphology with normal opening motion. There is no significant valvular disease.   5. Delayed enhancement imaging demonstrates no evidence of myocardial infarction, scar or infiltrative disease.   6.  Adenosine stress perfusion imaging demonstrates no evidence of inducible myocardial ischemia.    7. No intra-cardiac thrombus visualized.  ___________   [facility] 08/18/2017 [facility]): Ost LAD to Prox LAD lesion is 40% stenosed. Prox LAD lesion is 30% stenosed. 1st Mrg lesion is 60% stenosed. Prox RCA to Mid RCA lesion is 60% stenosed. Mid RCA to Dist RCA lesion is 30% stenosed. The left ventricular systolic function is normal. LV end diastolic pressure is normal. There is no aortic valve stenosis. There is no mitral valve stenosis and no mitral valve prolapse evident.   Patent stents in lad and rca with no critical disease.  Contiue medical management LV normal __________   2D echo 04/08/2016: - Left ventricle: The cavity size was mildly dilated. Wall    thickness was normal. Systolic function was normal. The estimated    ejection fraction was in the range of 60% to 65%. Wall motion was    normal; there were no regional wall motion abnormalities.  - Aortic valve: Valve area (Vmax): 3.06 cm^2.  -  Right atrium: The atrium was mildly dilated.   Impressions:   - Normal LVF    Normal Wall Motion    EF=65%    Normal RVF    Mild RVE. Normal study. No cardiac source of emboli was    indentified.  __________   2D echo 02/10/2016 [facility]): NORMAL LEFT VENTRICULAR SYSTOLIC FUNCTION  NORMAL RIGHT VENTRICULAR SYSTOLIC FUNCTION  MILD VALVULAR REGURGITATION  NO VALVULAR STENOSIS  Closest EF: >55% (Estimated)  Mitral: TRIVIAL MR  Aortic: TRIVIAL AR  Tricuspid: MILD TR  __________   [facility] 10/15/2015 [facility]): Findings:  1. Non flow limiting coronary artery disease (as described below)  including 50-60% RCA mid vessel stenosis, and diffuse mild LAD disease  2. Patent RCA patent stents  3. Normal left ventricular filling pressures (LVEPD = 6 mm Hg).  4. Left ventriculogram not performed, echocardiogram on 08/2015 shows  normal LV function   Recommendations:  Medical management of  moderate CAD- there are no targets for PCI,   Negative PET stress test 08/2015  Suspect that her symptom complex is multifactorial  Continue medical therapies  ___________   48-hour Holter 09/2015 [facility]): Findings:  The minimum heart rate was approximately 59 bpm  The maximum heart rate was approximately 126 bpm  The average heart rate was 83 bpm.   There were approximately 0 premature ventricular beat(s)  There were 0 ventricular run(s) noted.  s  There were approximately 10 supraventricular beat(s)  There were 0 SVT run(s) noted   Reported symptoms: No diary submitted   IMPRESSION: Sinus rhythm with rare PACs.  No sustained arrhythmias.  Essentially normal Holter.  __________   2D echo 09/17/2015 [facility]): Left ventricular hypertrophy - mild   Hyperdynamic left ventricular systolic function, ejection fraction 65 to  70%   Degenerative mitral valve disease   Aortic sclerosis   Normal right ventricular systolic function  __________   Myocardial PET 09/17/2015 [facility]): Impressions:  - Normal myocardial perfusion study  - No evidence for significant ischemia or scar is noted.  - During stress: Global systolic function is normal. The ejection fraction  was greater than 65%. Left ventricular chamber size is relatively small.  - Attenuation CT scan shows post PCI findings  - Also noted on attenuation CT is a large sized hiatal hernia and  gallstones (other findings as per CT 4/18)    EKG:  EKG is ordered today.  The EKG ordered today demonstrates NSR, 96 bpm, low voltage QRS, left posterior fascicular block, nonspecific ST-T changes  Recent Labs: 07/19/2023: ALT 20 03/19/2024: TSH 2.390 05/15/2024: BUN 34; Creatinine, Ser 1.78; Platelets 215 05/22/2024: Hemoglobin 9.2; Potassium 4.4; Sodium 141  Recent Lipid Panel No results found for: CHOL, TRIG, HDL, CHOLHDL, VLDL, LDLCALC, LDLDIRECT  PHYSICAL EXAM:    VS:  BP 134/78 (BP Location: Left Arm, Patient  Position: Sitting, Cuff Size: Normal)   Pulse 96   Ht 5' 2 (1.575 m)   Wt 179 lb (81.2 kg)   SpO2 94%   BMI 32.74 kg/m   BMI: Body mass index is 32.74 kg/m.  Physical Exam Constitutional:      Appearance: She is well-developed.  HENT:     Head: Normocephalic and atraumatic.  Eyes:     General:        Right eye: No discharge.        Left eye: No discharge.  Cardiovascular:     Rate and Rhythm: Normal rate and regular rhythm.     Heart sounds: S1  normal and S2 normal. Heart sounds not distant. No midsystolic click and no opening snap. Murmur heard.     Systolic murmur is present with a grade of 2/6 at the upper left sternal border.     No friction rub.     Comments: Right radial arteriotomy is well-healed without active bleeding, bruising, swelling, warmth, erythema, or tenderness to palpation.  Radial pulse 2+ proximal and distal to the arteriotomy site. Pulmonary:     Effort: Pulmonary effort is normal. No respiratory distress.     Breath sounds: Normal breath sounds. No decreased breath sounds, wheezing, rhonchi or rales.  Musculoskeletal:     Cervical back: Normal range of motion.     Right lower leg: No edema.     Left lower leg: No edema.  Skin:    General: Skin is warm and dry.     Nails: There is no clubbing.  Neurological:     Mental Status: She is alert and oriented to person, place, and time.  Psychiatric:        Speech: Speech normal.        Behavior: Behavior normal.        Thought Content: Thought content normal.        Judgment: Judgment normal.     Wt Readings from Last 3 Encounters:  06/06/24 179 lb (81.2 kg)  05/22/24 179 lb 14.4 oz (81.6 kg)  05/15/24 179 lb 12.8 oz (81.6 kg)     ASSESSMENT & PLAN:   CAD involving the native coronary arteries with chronic dyspnea: Chronic dyspnea and fatigue are largely unchanged.  Echo last month demonstrated preserved LV systolic function with grade 1 diastolic dysfunction.  R/LHC showed an occluded proximal RCA  with left-to-right collaterals to the distal RCA only given occluded RCA stents were noted all the way distally.  The LAD stent was patent with mild in-stent restenosis.  There was moderate mid LCx stenosis.  RHC showed mildly elevated filling pressures, mild pulmonary pretension, and normal cardiac output (following IV hydration precath for renal dysfunction).  There was not enough disease to justify CABG.  The RCA was chronically occluded with very little chance of success with PCI due to full metal jacket from the proximal to distal vessel.  Chronic dyspnea is likely multifactorial including underlying CAD, restrictive lung disease as noted on prior PFTs, paralyzed hemidiaphragm, anemia, and deconditioning.  Dyspnea does not appear to be consistent with decompensated HFpEF/pulmonary hypertension based on RHC findings (IV fluids received prior to cath).  She has previously been evaluated by pulmonology for chronic dyspnea as well with prior PFTs showing severe restrictive ventilatory defect with paradoxical improvement to moderately severe in supine position.  Check CBC to ensure stable hemoglobin.  Refer to pulmonary rehab.  Follow-up with pulmonology as scheduled.  She will otherwise continue aggressive risk factor medication and secondary prevention including aspirin  81 mg, clopidogrel  75 mg, titrated dose of atorvastatin  to 80 mg, and ezetimibe  10 mg.  No indication for further ischemic testing or diuresis at this time.  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  Mitral regurgitation: Mild to moderate by echo in 04/2024.  Not significantly volume up on cardiac cath.  Not primary driving force of chronic dyspnea.  Monitor with periodic echo.  HTN: Blood pressure is reasonably controlled in the office today.  Remains on Cardizem  CD 120 mg daily and losartan  50 mg.  HLD: LDL 74 in 11/2023.  Increase atorvastatin  80 mg with continuation of ezetimibe   10 mg.  Follow-up fasting lipid panel and  LFT in 2 months.  Untreated OSA: Possibly contributing to fatigue.  Intolerant to CPAP.  Follow-up with sleep medicine.    Disposition: F/u with Dr. Gollan or an APP in 6 months.   Medication Adjustments/Labs and Tests Ordered: Current medicines are reviewed at length with the patient today.  Concerns regarding medicines are outlined above. Medication changes, Labs and Tests ordered today are summarized above and listed in the Patient Instructions accessible in Encounters.   Signed, Bernardino Bring, PA-C 06/06/2024 4:32 PM     [facility] [place] [address] [place], [place] [address] [phone]     [1]  Current Meds  Medication Sig   acetaminophen  (TYLENOL ) 500 MG tablet Take 1,000 mg by mouth every 6 (six) hours as needed for mild pain or moderate pain.    acyclovir  (ZOVIRAX ) 400 MG tablet Take 400 mg by mouth daily as needed (for fever blisters).    aspirin  EC 81 MG tablet Take 81 mg by mouth at bedtime. Swallow whole.   Biotin  10 MG TABS Take 10 mg by mouth daily at 12 noon.   Calcium  Carbonate-Vitamin D  600-400 MG-UNIT tablet Take 1 tablet by mouth daily at 12 noon.   Cholecalciferol (VITAMIN D -3) 125 MCG (5000 UT) TABS Take 5,000 Units by mouth daily.   clopidogrel  (PLAVIX ) 75 MG tablet Take 75 mg by mouth daily at 12 noon.   cyanocobalamin  (VITAMIN B12) 1000 MCG tablet Take 1,000 mcg by mouth daily at 12 noon.   diltiazem  (CARDIZEM  CD) 120 MG 24 hr capsule Take 1 capsule (120 mg total) by mouth daily.   doxepin (SINEQUAN) 25 MG capsule Take 25 mg by mouth at bedtime as needed (sleep).   ezetimibe  (ZETIA ) 10 MG tablet Take 1 tablet by mouth once daily   ferrous sulfate  325 (65 FE) MG tablet Take 325 mg by mouth daily at 12 noon.   glipiZIDE  (GLUCOTROL ) 10 MG tablet Take 10 mg by mouth 2 (two) times daily.   insulin  glargine (LANTUS  SOLOSTAR) 100 UNIT/ML Solostar Pen Inject 36 Units into the skin daily. (Patient taking differently: Inject 36-50  Units into the skin daily at 12 noon. Give 36 daily, if higher then increase. Given at noon)   insulin  lispro (HUMALOG) 100 UNIT/ML KwikPen Inject 0-15 Units into the skin 3 (three) times daily as needed (high blood sugar).   LORazepam  (ATIVAN ) 1 MG tablet Take 1 mg by mouth every 8 (eight) hours as needed for anxiety or sleep.   losartan  (COZAAR ) 50 MG tablet Take 1 tablet (50 mg total) by mouth daily.   ondansetron  (ZOFRAN -ODT) 4 MG disintegrating tablet Take 4 mg by mouth every 8 (eight) hours as needed for nausea or vomiting.   OXcarbazepine (TRILEPTAL) 150 MG tablet Take 150 mg by mouth 2 (two) times daily as needed (Head pain).   pantoprazole  (PROTONIX ) 20 MG tablet Take 40 mg by mouth daily before breakfast.   [DISCONTINUED] atorvastatin  (LIPITOR) 40 MG tablet Take 40 mg by mouth at bedtime.   "

## 2024-06-06 ENCOUNTER — Encounter: Payer: Self-pay | Admitting: Physician Assistant

## 2024-06-06 ENCOUNTER — Ambulatory Visit: Attending: Physician Assistant | Admitting: Physician Assistant

## 2024-06-06 VITALS — BP 134/78 | HR 96 | Ht 62.0 in | Wt 179.0 lb

## 2024-06-06 DIAGNOSIS — G4733 Obstructive sleep apnea (adult) (pediatric): Secondary | ICD-10-CM | POA: Diagnosis present

## 2024-06-06 DIAGNOSIS — I25118 Atherosclerotic heart disease of native coronary artery with other forms of angina pectoris: Secondary | ICD-10-CM | POA: Diagnosis present

## 2024-06-06 DIAGNOSIS — J986 Disorders of diaphragm: Secondary | ICD-10-CM | POA: Insufficient documentation

## 2024-06-06 DIAGNOSIS — I340 Nonrheumatic mitral (valve) insufficiency: Secondary | ICD-10-CM | POA: Diagnosis not present

## 2024-06-06 DIAGNOSIS — E785 Hyperlipidemia, unspecified: Secondary | ICD-10-CM | POA: Diagnosis present

## 2024-06-06 DIAGNOSIS — I10 Essential (primary) hypertension: Secondary | ICD-10-CM | POA: Insufficient documentation

## 2024-06-06 DIAGNOSIS — I498 Other specified cardiac arrhythmias: Secondary | ICD-10-CM | POA: Insufficient documentation

## 2024-06-06 DIAGNOSIS — R0609 Other forms of dyspnea: Secondary | ICD-10-CM | POA: Insufficient documentation

## 2024-06-06 DIAGNOSIS — Z9889 Other specified postprocedural states: Secondary | ICD-10-CM | POA: Diagnosis present

## 2024-06-06 MED ORDER — ATORVASTATIN CALCIUM 80 MG PO TABS: 80.0000 mg | ORAL_TABLET | Freq: Every day | ORAL | 3 refills | Status: AC

## 2024-06-06 NOTE — Patient Instructions (Signed)
 Medication Instructions:  Your physician recommends the following medication changes.  INCREASE: Lipitor 80 mg daily    *If you need a refill on your cardiac medications before your next appointment, please call your pharmacy*  Lab Work: Your provider would like for you to have following labs drawn today CBC.    Your provider would like for you to return in about 2 months to have the following labs drawn: FASTING Lipid panel and liver function check.   Please go to [facility] [address] ([facility] [address], [place] [address] You do not need an appointment.  They are open from 8 am- 4:30 pm.  Lunch from 1:00 pm- 2:00 pm You DO need to be fasting.   You may also go to one of the following [facility]:  [address] [place], [place] [address] Phone: [phone] Lab hours: Mon-Fri 8 am- 5 pm    Lunch 12 pm- 1 pm  [address] [place],  [place]  [address]  [place]  Phone: [phone] Lab hours: 7 am- 4 pm Lunch 12 pm-1 pm   [address] [place],  [place]  [address]  [place]  Phone: [phone] Lab hours: Mon-Fri 8 am- 5 pm    Lunch 12 pm- 1 pm  If you have labs (blood work) drawn today and your tests are completely normal, you will receive your results only by: MyChart Message (if you have MyChart) OR A paper copy in the mail If you have any lab test that is abnormal or we need to change your treatment, we will call you to review the results.  Follow-Up: At [facility], you and your health needs are our priority.  As part of our continuing mission to provide you with exceptional heart care, our providers are all part of one team.  This team includes your primary Cardiologist (physician) and Advanced Practice Providers or APPs (Physician Assistants and Nurse Practitioners) who all work together to provide you with the care you need, when you need it.  Your next appointment:   6 month(s)  Provider:   You may see Timothy Gollan, MD or Bernardino Bring,  PA-C   We recommend signing up for the patient portal called MyChart.  Sign up information is provided on this After Visit Summary.  MyChart is used to connect with patients for Virtual Visits (Telemedicine).  Patients are able to view lab/test results, encounter notes, upcoming appointments, etc.  Non-urgent messages can be sent to your provider as well.   To learn more about what you can do with MyChart, go to [email or web address].

## 2024-06-07 LAB — CBC
Hematocrit: 32 % — ABNORMAL LOW (ref 34.0–46.6)
Hemoglobin: 10.4 g/dL — ABNORMAL LOW (ref 11.1–15.9)
MCH: 30.8 pg (ref 26.6–33.0)
MCHC: 32.5 g/dL (ref 31.5–35.7)
MCV: 95 fL (ref 79–97)
Platelets: 216 x10E3/uL (ref 150–450)
RBC: 3.38 x10E6/uL — ABNORMAL LOW (ref 3.77–5.28)
RDW: 13 % (ref 11.7–15.4)
WBC: 8.8 x10E3/uL (ref 3.4–10.8)

## 2024-06-08 ENCOUNTER — Ambulatory Visit: Payer: Self-pay | Admitting: Physician Assistant

## 2024-06-19 ENCOUNTER — Other Ambulatory Visit: Payer: Self-pay | Admitting: *Deleted

## 2024-06-19 DIAGNOSIS — R0609 Other forms of dyspnea: Secondary | ICD-10-CM

## 2024-11-19 ENCOUNTER — Ambulatory Visit: Admitting: Physician Assistant
